# Patient Record
Sex: Female | Born: 1935 | Race: White | Hispanic: No | Smoking: Former smoker
Health system: Southern US, Community
[De-identification: ages and names within clinical notes are randomized; demographics above are authoritative.]

## PROBLEM LIST (undated history)

## (undated) DIAGNOSIS — Z7901 Long term (current) use of anticoagulants: Secondary | ICD-10-CM

## (undated) DIAGNOSIS — I10 Essential (primary) hypertension: Secondary | ICD-10-CM

## (undated) DIAGNOSIS — Z9289 Personal history of other medical treatment: Secondary | ICD-10-CM

## (undated) DIAGNOSIS — K52831 Collagenous colitis: Secondary | ICD-10-CM

## (undated) DIAGNOSIS — K5792 Diverticulitis of intestine, part unspecified, without perforation or abscess without bleeding: Secondary | ICD-10-CM

## (undated) DIAGNOSIS — E119 Type 2 diabetes mellitus without complications: Secondary | ICD-10-CM

## (undated) DIAGNOSIS — M1990 Unspecified osteoarthritis, unspecified site: Secondary | ICD-10-CM

## (undated) DIAGNOSIS — R279 Unspecified lack of coordination: Secondary | ICD-10-CM

## (undated) DIAGNOSIS — K529 Noninfective gastroenteritis and colitis, unspecified: Secondary | ICD-10-CM

## (undated) DIAGNOSIS — R011 Cardiac murmur, unspecified: Secondary | ICD-10-CM

## (undated) DIAGNOSIS — J449 Chronic obstructive pulmonary disease, unspecified: Secondary | ICD-10-CM

## (undated) DIAGNOSIS — R55 Syncope and collapse: Secondary | ICD-10-CM

## (undated) DIAGNOSIS — I480 Paroxysmal atrial fibrillation: Secondary | ICD-10-CM

## (undated) DIAGNOSIS — J432 Centrilobular emphysema: Secondary | ICD-10-CM

## (undated) DIAGNOSIS — E785 Hyperlipidemia, unspecified: Secondary | ICD-10-CM

## (undated) DIAGNOSIS — I5032 Chronic diastolic (congestive) heart failure: Secondary | ICD-10-CM

## (undated) DIAGNOSIS — I700 Atherosclerosis of aorta: Secondary | ICD-10-CM

## (undated) DIAGNOSIS — F0390 Unspecified dementia without behavioral disturbance: Secondary | ICD-10-CM

## (undated) HISTORY — DX: Long term (current) use of anticoagulants: Z79.01

## (undated) HISTORY — DX: Noninfective gastroenteritis and colitis, unspecified: K52.9

## (undated) HISTORY — DX: Hyperlipidemia, unspecified: E78.5

## (undated) HISTORY — DX: Collagenous colitis: K52.831

## (undated) HISTORY — DX: Unspecified lack of coordination: R27.9

## (undated) HISTORY — PX: COLONOSCOPY WITH PROPOFOL: SHX5780

## (undated) HISTORY — PX: ABDOMINAL HYSTERECTOMY: SHX81

## (undated) HISTORY — DX: Paroxysmal atrial fibrillation: I48.0

---

## 1978-03-10 HISTORY — PX: NECK SURGERY: SHX720

## 2003-12-28 ENCOUNTER — Ambulatory Visit: Payer: Self-pay | Admitting: Family Medicine

## 2004-01-09 ENCOUNTER — Ambulatory Visit: Payer: Self-pay | Admitting: Family Medicine

## 2004-02-08 ENCOUNTER — Ambulatory Visit: Payer: Self-pay | Admitting: Family Medicine

## 2004-06-04 ENCOUNTER — Ambulatory Visit: Payer: Self-pay

## 2004-07-23 ENCOUNTER — Ambulatory Visit: Payer: Self-pay

## 2004-08-19 ENCOUNTER — Ambulatory Visit: Payer: Self-pay | Admitting: Family Medicine

## 2004-09-07 ENCOUNTER — Ambulatory Visit: Payer: Self-pay | Admitting: Family Medicine

## 2004-11-13 ENCOUNTER — Ambulatory Visit: Payer: Self-pay

## 2005-10-15 ENCOUNTER — Ambulatory Visit: Payer: Self-pay

## 2005-12-24 ENCOUNTER — Ambulatory Visit: Payer: Self-pay

## 2006-12-17 ENCOUNTER — Ambulatory Visit: Payer: Self-pay

## 2007-04-05 ENCOUNTER — Ambulatory Visit: Payer: Self-pay

## 2007-12-21 ENCOUNTER — Ambulatory Visit: Payer: Self-pay

## 2008-03-30 DIAGNOSIS — C4491 Basal cell carcinoma of skin, unspecified: Secondary | ICD-10-CM

## 2008-03-30 HISTORY — DX: Basal cell carcinoma of skin, unspecified: C44.91

## 2008-04-24 ENCOUNTER — Ambulatory Visit: Payer: Self-pay | Admitting: Gastroenterology

## 2008-04-24 LAB — HM COLONOSCOPY

## 2008-06-26 ENCOUNTER — Ambulatory Visit: Payer: Self-pay | Admitting: Family Medicine

## 2008-07-14 ENCOUNTER — Ambulatory Visit: Payer: Self-pay | Admitting: Specialist

## 2008-08-08 ENCOUNTER — Ambulatory Visit (HOSPITAL_COMMUNITY): Admission: RE | Admit: 2008-08-08 | Discharge: 2008-08-09 | Payer: Self-pay | Admitting: Neurosurgery

## 2008-08-08 HISTORY — PX: BACK SURGERY: SHX140

## 2009-03-19 ENCOUNTER — Ambulatory Visit: Payer: Self-pay

## 2010-01-09 LAB — HM DEXA SCAN: HM Dexa Scan: NORMAL

## 2010-04-22 ENCOUNTER — Ambulatory Visit: Payer: Self-pay

## 2010-06-17 LAB — CBC
Hemoglobin: 14.4 g/dL (ref 12.0–15.0)
MCHC: 34.3 g/dL (ref 30.0–36.0)
MCV: 90.3 fL (ref 78.0–100.0)
RDW: 13.1 % (ref 11.5–15.5)

## 2010-06-17 LAB — GLUCOSE, CAPILLARY
Glucose-Capillary: 110 mg/dL — ABNORMAL HIGH (ref 70–99)
Glucose-Capillary: 146 mg/dL — ABNORMAL HIGH (ref 70–99)
Glucose-Capillary: 203 mg/dL — ABNORMAL HIGH (ref 70–99)

## 2010-06-17 LAB — BASIC METABOLIC PANEL
CO2: 30 mEq/L (ref 19–32)
Calcium: 9.6 mg/dL (ref 8.4–10.5)
Chloride: 105 mEq/L (ref 96–112)
Creatinine, Ser: 0.71 mg/dL (ref 0.4–1.2)
Glucose, Bld: 149 mg/dL — ABNORMAL HIGH (ref 70–99)

## 2010-07-23 NOTE — Op Note (Signed)
[name], [name]              ACCOUNT NO.:  [id number]   MEDICAL RECORD NO.:  [id number]          PATIENT TYPE:  INP   LOCATION:  3535                         FACILITY:  [facility]   PHYSICIAN:  Danae Orleans. Venetia Maxon, M.D.  DATE OF BIRTH:  [birth year]   DATE OF PROCEDURE:  08/08/2008  DATE OF DISCHARGE:                               OPERATIVE REPORT   PREOPERATIVE DIAGNOSIS:  Far lateral right L5-S1 herniated lumbar disk  with spondylosis, degenerative disk disease and radiculopathy.   POSTOPERATIVE DIAGNOSIS:  Far lateral right L5-S1 herniated lumbar disk  with spondylosis, degenerative disk disease and radiculopathy.   PROCEDURE:  Right L5-S1 far lateral microdiskectomy with  microdissection.   SURGEON:  Danae Orleans. Venetia Maxon, MD   ASSISTANT:  Clydene Fake, MD   ANESTHESIA:  General endotracheal anesthesia.   ESTIMATED BLOOD LOSS:  50 mL.   COMPLICATIONS:  None.   DISPOSITION:  To recovery.   INDICATIONS:  [name] is a 77 year old woman with a large acute  onset disk herniation at L5-S1 on the right extraforaminally causing  right L5 nerve root compression and right L5 weakness.  It was elected  to take her to surgery for right far lateral microdiskectomy.   PROCEDURE:  Ms. Joens was brought to the operating room.  Following  satisfactory and uncomplicated induction of general endotracheal  anesthesia plus intravenous lines, the patient was placed in a prone  position on Wilson frame.  Her low back was prepped and draped in the  usual sterile fashion.  With DuraPrep marking x-ray was obtained with a  spinal needle overlying the L5.  Incision was made in the midline and  carried laterally to expose the L5 transverse process and sacral ala on  the right and an intraoperative x-ray confirmed correct orientation.  A  microscope was brought into the field.  Versa-Trac retractor was placed  to facilitate exposure.  The lateral aspect of the arthritic facet joint  was drilled  down and was the pars at this level and the L5 nerve root  was identified after removing the intertransverse ligament.  The nerve  root appeared to be compressed significantly and a large amount of  herniated disk material was found medial and inferomedial to the nerve  root with further removal of bone overlying the sacrum and also more  medially overlying the herniated disk.  Multiple fragments of disk  material were removed with micro pituitary and the nerve root appeared  to take on a more normal course rather then bowed under compression.  A  variety of ball hooks were used to decompress the epidural space and to  confirm that the nerve root was well decompressed.  Hemostasis was  assured with Gelfoam soaked in thrombin and cottonoids.  The wound was  irrigated.  Operative site was bathed in Depo-Medrol and fentanyl.  The  self-retaining retractor was removed.  The lumbodorsal fascia was closed  with 1 Vicryl sutures, subcutaneous tissues were approximated with 2-0  Vicryl  interrupted inverted sutures and skin edges were reapproximated with  interrupted 3-0 Vicryl subcuticular stitch.  The wound was dressed  with  Dermabond.  The patient was extubated in the operating room and taken to  recovery in stable satisfactory condition having tolerated her operation  well.      Danae Orleans. Venetia Maxon, M.D.  Electronically Signed     JDS/MEDQ  D:  08/08/2008  T:  08/09/2008  Job:  [id number]

## 2011-05-30 ENCOUNTER — Ambulatory Visit: Payer: Self-pay

## 2011-10-10 DIAGNOSIS — Z85828 Personal history of other malignant neoplasm of skin: Secondary | ICD-10-CM | POA: Insufficient documentation

## 2011-10-21 ENCOUNTER — Ambulatory Visit: Payer: Self-pay | Admitting: Specialist

## 2011-11-14 ENCOUNTER — Ambulatory Visit: Payer: Self-pay | Admitting: Cardiology

## 2013-02-28 ENCOUNTER — Emergency Department: Payer: Self-pay | Admitting: Emergency Medicine

## 2013-07-15 ENCOUNTER — Ambulatory Visit: Payer: Self-pay | Admitting: Gastroenterology

## 2013-10-19 LAB — HEMOGLOBIN A1C: Hgb A1c MFr Bld: 6.5 % — AB (ref 4.0–6.0)

## 2014-03-16 DIAGNOSIS — H2513 Age-related nuclear cataract, bilateral: Secondary | ICD-10-CM | POA: Diagnosis not present

## 2014-04-19 DIAGNOSIS — E559 Vitamin D deficiency, unspecified: Secondary | ICD-10-CM | POA: Diagnosis not present

## 2014-04-19 DIAGNOSIS — Z1389 Encounter for screening for other disorder: Secondary | ICD-10-CM | POA: Diagnosis not present

## 2014-04-19 DIAGNOSIS — H2513 Age-related nuclear cataract, bilateral: Secondary | ICD-10-CM | POA: Diagnosis not present

## 2014-04-19 DIAGNOSIS — I10 Essential (primary) hypertension: Secondary | ICD-10-CM | POA: Diagnosis not present

## 2014-04-19 DIAGNOSIS — E780 Pure hypercholesterolemia: Secondary | ICD-10-CM | POA: Diagnosis not present

## 2014-04-19 DIAGNOSIS — E119 Type 2 diabetes mellitus without complications: Secondary | ICD-10-CM | POA: Diagnosis not present

## 2014-04-19 LAB — LIPID PANEL
CHOLESTEROL: 180 mg/dL (ref 0–200)
HDL: 62 mg/dL (ref 35–70)
LDL CALC: 90 mg/dL
LDl/HDL Ratio: 1.5
TRIGLYCERIDES: 141 mg/dL (ref 40–160)

## 2014-04-19 LAB — BASIC METABOLIC PANEL
BUN: 27 mg/dL — AB (ref 4–21)
CREATININE: 1 mg/dL (ref 0.5–1.1)
GLUCOSE: 140 mg/dL
Potassium: 4.6 mmol/L (ref 3.4–5.3)
SODIUM: 143 mmol/L (ref 137–147)

## 2014-04-19 LAB — HEPATIC FUNCTION PANEL
ALK PHOS: 38 U/L (ref 25–125)
ALT: 21 U/L (ref 7–35)
AST: 16 U/L (ref 13–35)
Bilirubin, Total: 0.6 mg/dL

## 2014-04-19 LAB — CBC AND DIFFERENTIAL
HEMATOCRIT: 42 % (ref 36–46)
HEMOGLOBIN: 13.9 g/dL (ref 12.0–16.0)
Neutrophils Absolute: 4 /uL
Platelets: 268 10*3/uL (ref 150–399)
WBC: 9.7 10^3/mL

## 2014-04-19 LAB — TSH: TSH: 2.17 u[IU]/mL (ref 0.41–5.90)

## 2014-04-25 DIAGNOSIS — Z1231 Encounter for screening mammogram for malignant neoplasm of breast: Secondary | ICD-10-CM | POA: Diagnosis not present

## 2014-04-25 DIAGNOSIS — Z01419 Encounter for gynecological examination (general) (routine) without abnormal findings: Secondary | ICD-10-CM | POA: Diagnosis not present

## 2014-05-01 ENCOUNTER — Ambulatory Visit: Payer: Self-pay | Admitting: Ophthalmology

## 2014-05-01 DIAGNOSIS — Z7982 Long term (current) use of aspirin: Secondary | ICD-10-CM | POA: Diagnosis not present

## 2014-05-01 DIAGNOSIS — I10 Essential (primary) hypertension: Secondary | ICD-10-CM | POA: Diagnosis not present

## 2014-05-01 DIAGNOSIS — Z90710 Acquired absence of both cervix and uterus: Secondary | ICD-10-CM | POA: Diagnosis not present

## 2014-05-01 DIAGNOSIS — Z79899 Other long term (current) drug therapy: Secondary | ICD-10-CM | POA: Diagnosis not present

## 2014-05-01 DIAGNOSIS — Z9889 Other specified postprocedural states: Secondary | ICD-10-CM | POA: Diagnosis not present

## 2014-05-01 DIAGNOSIS — H2513 Age-related nuclear cataract, bilateral: Secondary | ICD-10-CM | POA: Diagnosis not present

## 2014-05-01 DIAGNOSIS — E118 Type 2 diabetes mellitus with unspecified complications: Secondary | ICD-10-CM | POA: Diagnosis not present

## 2014-05-01 DIAGNOSIS — Z85828 Personal history of other malignant neoplasm of skin: Secondary | ICD-10-CM | POA: Diagnosis not present

## 2014-05-01 DIAGNOSIS — H2511 Age-related nuclear cataract, right eye: Secondary | ICD-10-CM | POA: Diagnosis not present

## 2014-05-01 DIAGNOSIS — E780 Pure hypercholesterolemia: Secondary | ICD-10-CM | POA: Diagnosis not present

## 2014-05-01 DIAGNOSIS — H269 Unspecified cataract: Secondary | ICD-10-CM | POA: Diagnosis not present

## 2014-05-31 DIAGNOSIS — Z961 Presence of intraocular lens: Secondary | ICD-10-CM | POA: Diagnosis not present

## 2014-06-27 DIAGNOSIS — D692 Other nonthrombocytopenic purpura: Secondary | ICD-10-CM | POA: Diagnosis not present

## 2014-06-27 DIAGNOSIS — L570 Actinic keratosis: Secondary | ICD-10-CM | POA: Diagnosis not present

## 2014-06-27 DIAGNOSIS — L821 Other seborrheic keratosis: Secondary | ICD-10-CM | POA: Diagnosis not present

## 2014-06-27 DIAGNOSIS — Z85828 Personal history of other malignant neoplasm of skin: Secondary | ICD-10-CM | POA: Diagnosis not present

## 2014-06-27 DIAGNOSIS — L565 Disseminated superficial actinic porokeratosis (DSAP): Secondary | ICD-10-CM | POA: Diagnosis not present

## 2014-07-09 NOTE — H&P (Signed)
PATIENT NAME:  [name], [name] MR#:  [id number] DATE OF BIRTH:  January 14, 1932  DATE OF ADMISSION:  05/01/2014  PREOPERATIVE DIAGNOSIS: Cataract, right eye.   POSTOPERATIVE DIAGNOSIS: Cataract, right eye.   PROCEDURE PERFORMED: Extracapsular cataract extraction using phacoemulsification with placement of Alcon [id number], 21.0 diopter posterior chamber lens, serial number [id number].   SURGEON: Loura Back. Tashaun Obey, M.D.   ANESTHESIA: 4% lidocaine, 0.75% Marcaine a 50-50 mixture with 10 units/mL of HyoMax added, given as a peribulbar.   ANESTHESIOLOGIST: Dr. Boston Service.   COMPLICATIONS: None.   ESTIMATED BLOOD LOSS: Less than 1 mL.   DESCRIPTION OF PROCEDURE:  The patient was brought to the operating room and given a peribulbar block.  The patient was then prepped and draped in the usual fashion.  The vertical rectus muscles were imbricated using 5-0 silk sutures.  These sutures were then clamped to the sterile drapes as bridle sutures.  A limbal peritomy was performed extending two clock hours and hemostasis was obtained with cautery.  A partial thickness scleral groove was made at the surgical limbus and dissected anteriorly in a lamellar dissection using an Alcon crescent knife.  The anterior chamber was entered superonasally with a Superblade and through the lamellar dissection with a 2.6 mm keratome.  DisCoVisc was used to replace the aqueous and a continuous tear capsulorrhexis was carried out.  Hydrodissection and hydrodelineation were carried out with balanced salt and a 27 gauge canula.  The nucleus was rotated to confirm the effectiveness of the hydrodissection.  Phacoemulsification was carried out using a divide-and-conquer technique.  Total ultrasound time was 1 minute and 52 seconds with an average power of 26.9 percent. CDE 51.11. No suture was placed.   Irrigation/aspiration was used to remove the residual cortex.  DisCoVisc was used to inflate the capsule and the internal  incision was enlarged to 3 mm with the crescent knife.  The intraocular lens was folded and inserted into the capsular bag using the AcrySert delivery system was used instead of a Librarian, academic.  Irrigation/aspiration was used to remove the residual DisCoVisc.  Miostat was injected into the anterior chamber through the paracentesis track to inflate the anterior chamber and induce miosis.  The wound was checked for leaks and none were found. The conjunctiva was closed with cautery and the bridle sutures were removed.  Two drops of 0.3% Vigamox were placed on the eye.   An eye shield was placed on the eye.  The patient was discharged to the recovery room in good condition.   ____________________________ Loura Back Star Cheese, MD sad:mc D: 05/01/2014 13:23:38 ET T: 05/01/2014 13:53:41 ET JOB#: [id number]  cc: Remo Lipps A. Neizan Debruhl, MD, <Dictator>

## 2014-07-09 NOTE — Op Note (Signed)
PATIENT NAME:  [name], [name] MR#:  [id number] DATE OF BIRTH:  [birth year]  DATE OF PROCEDURE:  05/01/2014  PREOPERATIVE DIAGNOSIS: Cataract, right eye.   POSTOPERATIVE DIAGNOSIS: Cataract, right eye.   PROCEDURE PERFORMED: Extracapsular cataract extraction using phacoemulsification with placement of Alcon SN6CWS, 21.0 diopter posterior chamber lens, serial number [id number].   SURGEON: Loura Back. Allure Greaser, M.D.   ANESTHESIA: 4% lidocaine, 0.75% Marcaine a 50-50 mixture with 10 units/mL of HyoMax added, given as a peribulbar.   ANESTHESIOLOGIST: Dr. Boston Service.   COMPLICATIONS: None.   ESTIMATED BLOOD LOSS: Less than 1 mL.   DESCRIPTION OF PROCEDURE:  The patient was brought to the operating room and given a peribulbar block.  The patient was then prepped and draped in the usual fashion.  The vertical rectus muscles were imbricated using 5-0 silk sutures.  These sutures were then clamped to the sterile drapes as bridle sutures.  A limbal peritomy was performed extending two clock hours and hemostasis was obtained with cautery.  A partial thickness scleral groove was made at the surgical limbus and dissected anteriorly in a lamellar dissection using an Alcon crescent knife.  The anterior chamber was entered superonasally with a Superblade and through the lamellar dissection with a 2.6 mm keratome.  DisCoVisc was used to replace the aqueous and a continuous tear capsulorrhexis was carried out.  Hydrodissection and hydrodelineation were carried out with balanced salt and a 27 gauge canula.  The nucleus was rotated to confirm the effectiveness of the hydrodissection.  Phacoemulsification was carried out using a divide-and-conquer technique.  Total ultrasound time was 1 minute and 52 seconds with an average power of 26.9 percent. CDE 51.11. No suture was placed.   Irrigation/aspiration was used to remove the residual cortex.  DisCoVisc was used to inflate the capsule and the internal  incision was enlarged to 3 mm with the crescent knife.  The intraocular lens was folded and inserted into the capsular bag using the AcrySert delivery system was used instead of a Librarian, academic.  Irrigation/aspiration was used to remove the residual DisCoVisc.  Miostat was injected into the anterior chamber through the paracentesis track to inflate the anterior chamber and induce miosis.  The wound was checked for leaks and none were found. The conjunctiva was closed with cautery and the bridle sutures were removed.  Two drops of 0.3% Vigamox were placed on the eye.   An eye shield was placed on the eye.  The patient was discharged to the recovery room in good condition.  ____________________________ Loura Back Haifa Hatton, MD sad:mc D: 05/01/2014 13:23:00 ET T: 05/01/2014 13:53:41 ET JOB#: [id number]  cc: Remo Lipps A. Masaye Gatchalian, MD, <Dictator> Martie Lee MD ELECTRONICALLY SIGNED 05/08/2014 10:52

## 2014-07-12 DIAGNOSIS — I152 Hypertension secondary to endocrine disorders: Secondary | ICD-10-CM | POA: Insufficient documentation

## 2014-07-12 DIAGNOSIS — K52831 Collagenous colitis: Secondary | ICD-10-CM | POA: Insufficient documentation

## 2014-07-12 DIAGNOSIS — E559 Vitamin D deficiency, unspecified: Secondary | ICD-10-CM | POA: Insufficient documentation

## 2014-07-12 DIAGNOSIS — E1159 Type 2 diabetes mellitus with other circulatory complications: Secondary | ICD-10-CM | POA: Insufficient documentation

## 2014-07-12 DIAGNOSIS — E669 Obesity, unspecified: Secondary | ICD-10-CM | POA: Insufficient documentation

## 2014-07-12 DIAGNOSIS — E119 Type 2 diabetes mellitus without complications: Secondary | ICD-10-CM | POA: Insufficient documentation

## 2014-07-12 DIAGNOSIS — L719 Rosacea, unspecified: Secondary | ICD-10-CM | POA: Insufficient documentation

## 2014-07-12 DIAGNOSIS — E7800 Pure hypercholesterolemia, unspecified: Secondary | ICD-10-CM | POA: Insufficient documentation

## 2014-07-12 DIAGNOSIS — I8390 Asymptomatic varicose veins of unspecified lower extremity: Secondary | ICD-10-CM | POA: Insufficient documentation

## 2014-07-12 DIAGNOSIS — I10 Essential (primary) hypertension: Secondary | ICD-10-CM | POA: Insufficient documentation

## 2014-07-12 DIAGNOSIS — M1990 Unspecified osteoarthritis, unspecified site: Secondary | ICD-10-CM | POA: Insufficient documentation

## 2014-07-12 DIAGNOSIS — C4491 Basal cell carcinoma of skin, unspecified: Secondary | ICD-10-CM | POA: Insufficient documentation

## 2014-07-19 ENCOUNTER — Emergency Department
Admission: EM | Admit: 2014-07-19 | Discharge: 2014-07-19 | Disposition: A | Discharge status: Home / Self Care | Payer: Medicare Other | Attending: Emergency Medicine | Admitting: Emergency Medicine

## 2014-07-19 ENCOUNTER — Emergency Department: Payer: Medicare Other

## 2014-07-19 ENCOUNTER — Other Ambulatory Visit: Payer: Self-pay

## 2014-07-19 ENCOUNTER — Encounter: Payer: Self-pay | Admitting: Emergency Medicine

## 2014-07-19 DIAGNOSIS — Z7982 Long term (current) use of aspirin: Secondary | ICD-10-CM | POA: Diagnosis not present

## 2014-07-19 DIAGNOSIS — R05 Cough: Secondary | ICD-10-CM | POA: Diagnosis not present

## 2014-07-19 DIAGNOSIS — J40 Bronchitis, not specified as acute or chronic: Secondary | ICD-10-CM | POA: Insufficient documentation

## 2014-07-19 DIAGNOSIS — Z880 Allergy status to penicillin: Secondary | ICD-10-CM | POA: Diagnosis not present

## 2014-07-19 DIAGNOSIS — R531 Weakness: Secondary | ICD-10-CM | POA: Insufficient documentation

## 2014-07-19 DIAGNOSIS — R61 Generalized hyperhidrosis: Secondary | ICD-10-CM | POA: Insufficient documentation

## 2014-07-19 DIAGNOSIS — Z87891 Personal history of nicotine dependence: Secondary | ICD-10-CM | POA: Diagnosis not present

## 2014-07-19 DIAGNOSIS — R509 Fever, unspecified: Secondary | ICD-10-CM | POA: Diagnosis not present

## 2014-07-19 DIAGNOSIS — R112 Nausea with vomiting, unspecified: Secondary | ICD-10-CM | POA: Diagnosis not present

## 2014-07-19 DIAGNOSIS — E119 Type 2 diabetes mellitus without complications: Secondary | ICD-10-CM | POA: Diagnosis not present

## 2014-07-19 DIAGNOSIS — I10 Essential (primary) hypertension: Secondary | ICD-10-CM | POA: Insufficient documentation

## 2014-07-19 DIAGNOSIS — Z79899 Other long term (current) drug therapy: Secondary | ICD-10-CM | POA: Diagnosis not present

## 2014-07-19 HISTORY — DX: Type 2 diabetes mellitus without complications: E11.9

## 2014-07-19 HISTORY — DX: Essential (primary) hypertension: I10

## 2014-07-19 HISTORY — DX: Diverticulitis of intestine, part unspecified, without perforation or abscess without bleeding: K57.92

## 2014-07-19 LAB — BASIC METABOLIC PANEL
ANION GAP: 10 (ref 5–15)
BUN: 28 mg/dL — AB (ref 6–20)
CHLORIDE: 99 mmol/L — AB (ref 101–111)
CO2: 31 mmol/L (ref 22–32)
CREATININE: 1.24 mg/dL — AB (ref 0.44–1.00)
Calcium: 9.2 mg/dL (ref 8.9–10.3)
GFR calc Af Amer: 46 mL/min — ABNORMAL LOW (ref >60)
GFR calc non Af Amer: 40 mL/min — ABNORMAL LOW (ref >60)
GLUCOSE: 188 mg/dL — AB (ref 65–99)
Potassium: 3.8 mmol/L (ref 3.5–5.1)
Sodium: 140 mmol/L (ref 135–145)

## 2014-07-19 LAB — CBC
HEMATOCRIT: 42.8 % (ref 35.0–47.0)
HEMOGLOBIN: 14.1 g/dL (ref 12.0–16.0)
MCH: 30.4 pg (ref 26.0–34.0)
MCHC: 32.9 g/dL (ref 32.0–36.0)
MCV: 92.2 fL (ref 80.0–100.0)
Platelets: 191 10*3/uL (ref 150–440)
RBC: 4.64 MIL/uL (ref 3.80–5.20)
RDW: 13.2 % (ref 11.5–14.5)
WBC: 5.8 10*3/uL (ref 3.6–11.0)

## 2014-07-19 LAB — TROPONIN I

## 2014-07-19 MED ORDER — BENZONATATE 200 MG PO CAPS: 200.0000 mg | ORAL_CAPSULE | Freq: Three times a day (TID) | ORAL | Status: DC | PRN

## 2014-07-19 MED ORDER — AZITHROMYCIN 250 MG PO TABS: ORAL_TABLET | ORAL | Status: DC

## 2014-07-19 MED ORDER — ONDANSETRON 4 MG PO TBDP
4.0000 mg | ORAL_TABLET | Freq: Once | ORAL | Status: AC
  Administered 2014-07-19: 4 mg via ORAL

## 2014-07-19 MED ORDER — ONDANSETRON 4 MG PO TBDP
ORAL_TABLET | ORAL | Status: AC
  Filled 2014-07-19: qty 1

## 2014-07-19 NOTE — ED Notes (Addendum)
Patient c/o cough and weakness since last Saturday. Reports some n/v. Also states she broke out in a cold sweat at one point today

## 2014-07-19 NOTE — Discharge Instructions (Signed)

## 2014-07-19 NOTE — ED Provider Notes (Signed)
[facility] Emergency Department Provider Note    Time seen: 1430 p.m.  I have reviewed the triage vital signs and the nursing notes.   HISTORY  Chief Complaint Cough and Weakness    HPI [name] is a 81 y.o. female who presents to ER for cough weakness since Saturday. Reports some nausea vomiting and she had some sweats.She states she has not had a fever, no headache back pain no abdominal pain no vomiting or diarrhea. Some persistent cough, symptoms are mild to moderate and she feels weak. Nothing makes her symptoms better coughing makes it worse.    Past Medical History  Diagnosis Date  . Diabetes mellitus without complication   . Hypertension   . Diverticulitis     Patient Active Problem List   Diagnosis Date Noted  . Arthritis 07/12/2014  . Basal cell carcinoma 07/12/2014  . CC (collagenous colitis) 07/12/2014  . Essential (primary) hypertension 07/12/2014  . Hypercholesteremia 07/12/2014  . Adiposity 07/12/2014  . Acne erythematosa 07/12/2014  . Diabetes mellitus, type 2 07/12/2014  . 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 Avitaminosis D 07/12/2014    Past Surgical History  Procedure Laterality Date  . Abdominal hysterectomy    . Back surgery  08/08/2008    Lumbar discectomy  . Neck surgery  1980    Current Outpatient Rx  Name  Route  Sig  Dispense  Refill  . aspirin 81 MG tablet   Oral   Take by mouth.         Marland Kitchen atenolol (TENORMIN) 25 MG tablet   Oral   Take by mouth.         . Blood Glucose Monitoring Suppl (ACCU-CHEK AVIVA CONNECT) W/DEVICE KIT      ACCU-CHEK AVIVA (In Vitro Strip)  1 (one) Strip Strip daily for 0 days  Quantity: 100;  Refills: 3   Ordered :24-Oct-2013  Lynford Humphrey ;  Started 24-Oct-2013 Active Comments: Dx: 250.00 also include Accu-chek aviva meter         . budesonide (ENTOCORT EC) 3 MG 24 hr capsule   Oral   Take by mouth.         . Calcium-Vitamin D 600-200 MG-UNIT per tablet   Oral  Take by mouth.         . Cholecalciferol (VITAMIN D) 2000 UNITS CAPS   Oral   Take by mouth.         Marland Kitchen glucose blood test strip      BAYER CONTOUR TEST (In Vitro Strip)  1 (one) Strip Strip to check blood sugar daily for 0 days  Quantity: 50;  Refills: 5   Ordered :20-May-2013  Gerald Leitz ;  Started 20-May-2013 Active Comments: & Lancets         . lisinopril-hydrochlorothiazide (PRINZIDE,ZESTORETIC) 10-12.5 MG per tablet   Oral   Take by mouth.         . metFORMIN (GLUCOPHAGE) 500 MG tablet   Oral   Take by mouth.         . Multiple Vitamin tablet   Oral   Take by mouth.         . OMEGA-3 FATTY ACIDS PO   Oral   Take by mouth.         . simvastatin (ZOCOR) 20 MG tablet   Oral   Take by mouth.           Allergies Morphine sulfate and Penicillins  Family History  Problem Relation Age of Onset  .  Hypertension Mother   . Diabetes Mother   . Lung cancer Father     Social History History  Substance Use Topics  . Smoking status: Former Smoker    Quit date: 03/10/1994  . Smokeless tobacco: Not on file  . Alcohol Use: Yes     Comment: occasional    Review of Systems Constitutional: Negative for fever. Eyes: Negative for visual changes. ENT: Negative for sore throat. Cardiovascular: Negative for chest pain. Respiratory: Negative for shortness of breath, positive for cough Gastrointestinal: Negative for abdominal pain, vomiting and diarrhea. Genitourinary: Negative for dysuria. Musculoskeletal: Negative for back pain. Skin: Negative for rash. Neurological: Negative for headaches, positive for weakness  10-point ROS otherwise negative.  ____________________________________________   PHYSICAL EXAM:  VITAL SIGNS: ED Triage Vitals  Enc Vitals Group     BP 07/19/14 1143 118/58 mmHg     Pulse Rate 07/19/14 1143 51     Resp 07/19/14 1143 17     Temp 07/19/14 1143 97.7 F (36.5 C)     Temp Source 07/19/14 1143 Oral     SpO2 07/19/14  1143 97 %     Weight 07/19/14 1143 168 lb (76.204 kg)     Height 07/19/14 1143 _0  (1.6 m)     Head Cir --      Peak Flow --      Pain Score --      Pain Loc --      Pain Edu? --      Excl. in [place]? --     Constitutional: Alert and oriented. Well appearing and in no distress. Eyes: Conjunctivae are normal. PERRL. Normal extraocular movements. ENT   Head: Normocephalic and atraumatic.   Nose: No congestion/rhinnorhea.   Mouth/Throat: Mucous membranes are moist.   Neck: No stridor. Hematological/Lymphatic/Immunilogical: No cervical lymphadenopathy. Cardiovascular: Normal rate, regular rhythm. Normal and symmetric distal pulses are present in all extremities. No murmurs, rubs, or gallops. Respiratory: Normal respiratory effort without tachypnea nor retractions. Breath sounds are clear and equal bilaterally. No wheezes/rales/rhonchi. Gastrointestinal: Soft and nontender. No distention. No abdominal bruits. There is no CVA tenderness. Musculoskeletal: Nontender with normal range of motion in all extremities. No joint effusions.  No lower extremity tenderness nor edema. Neurologic:  Normal speech and language. No gross focal neurologic deficits are appreciated. Speech is normal. No gait instability. Skin:  Skin is warm, dry and intact. No rash noted. Psychiatric: Mood and affect are normal. Speech and behavior are normal. Patient exhibits appropriate insight and judgment.  ____________________________________________    LABS (pertinent positives/negatives)  Labs Reviewed  CBC  TROPONIN I  BASIC METABOLIC PANEL  URINALYSIS COMPLETEWITH MICROSCOPIC ([place])    Labs are unremarkable  ____________________________________________    RADIOLOGY  Normal chest x-ray  ____________________________________________    ED COURSE  Pertinent labs & imaging results that were available during my care of the patient were reviewed by me and considered in my medical decision  making (see chart for details).  We checked basic labs and chest x-ray and reevaluate  FINAL ASSESSMENT AND PLAN  Assessment: Bronchitis   Plan: We'll cover patient with antibiotics and cough suppressant. This is likely viral, the family is reassured with antibiotics. Stable for outpatient follow-up.    Earleen Newport, MD   Earleen Newport, MD 07/19/14 [phone]

## 2014-08-15 ENCOUNTER — Ambulatory Visit (INDEPENDENT_AMBULATORY_CARE_PROVIDER_SITE_OTHER): Payer: Medicare Other | Admitting: Family Medicine

## 2014-08-15 ENCOUNTER — Encounter: Payer: Self-pay | Admitting: Family Medicine

## 2014-08-15 VITALS — BP 122/64 | HR 56 | Temp 97.8°F | Resp 16 | Ht 61.5 in | Wt 178.0 lb

## 2014-08-15 DIAGNOSIS — E119 Type 2 diabetes mellitus without complications: Secondary | ICD-10-CM

## 2014-08-15 DIAGNOSIS — I10 Essential (primary) hypertension: Secondary | ICD-10-CM | POA: Diagnosis not present

## 2014-08-15 DIAGNOSIS — E7800 Pure hypercholesterolemia, unspecified: Secondary | ICD-10-CM

## 2014-08-15 DIAGNOSIS — R609 Edema, unspecified: Secondary | ICD-10-CM | POA: Insufficient documentation

## 2014-08-15 LAB — POCT UA - MICROALBUMIN: MICROALBUMIN (UR) POC: 20 mg/L

## 2014-08-15 LAB — POCT GLYCOSYLATED HEMOGLOBIN (HGB A1C): Hemoglobin A1C: 7.5

## 2014-08-15 NOTE — Progress Notes (Signed)
Subjective:    Patient ID: [name], female    DOB: 06-02-1931, 81 y.o.   MRN: [id number]  Diabetes She presents for her follow-up diabetic visit. She has type 2 diabetes mellitus. Her disease course has been stable. There are no hypoglycemic associated symptoms. Pertinent negatives for hypoglycemia include no headaches or sweats. Pertinent negatives for diabetes include no blurred vision, no chest pain, no fatigue, no foot paresthesias, no foot ulcerations, no polydipsia, no polyphagia, no polyuria, no visual change, no weakness and no weight loss. There are no hypoglycemic complications. Symptoms are stable. Risk factors for coronary artery disease include hypertension. Current diabetic treatment includes oral agent (monotherapy). She is compliant with treatment all of the time. Her weight is stable. There is no change in her home blood glucose trend. An ACE inhibitor/angiotensin II receptor blocker is being taken. Eye exam is current.  Hypertension This is a chronic problem. The problem is unchanged. The problem is controlled. Associated symptoms include shortness of breath (Chronic Issue). Pertinent negatives include no anxiety, blurred vision, chest pain, headaches, malaise/fatigue, neck pain, orthopnea, palpitations, peripheral edema, PND or sweats. Risk factors for coronary artery disease include diabetes mellitus. There are no compliance problems.   Hyperlipidemia This is a chronic problem. The problem is controlled. Recent lipid tests were reviewed and are normal. Exacerbating diseases include diabetes. Associated symptoms include shortness of breath (Chronic Issue). Pertinent negatives include no chest pain, leg pain or myalgias. Current antihyperlipidemic treatment includes statins. There are no compliance problems.  Risk factors for coronary artery disease include diabetes mellitus and hypertension.   Pt complains of Swelling of her hands and feet.  It has been going to for a long  time but seems to be worsening especially since the weather has become warmer.  She would like to try a diuretic only as needed.     Review of Systems  Constitutional: Negative for weight loss, malaise/fatigue, activity change, appetite change, fatigue and unexpected weight change.  Eyes: Negative for blurred vision.  Respiratory: Positive for shortness of breath (Chronic Issue). Negative for cough, chest tightness and wheezing.   Cardiovascular: Positive for leg swelling. Negative for chest pain, palpitations, orthopnea and PND.  Gastrointestinal: Negative for nausea, vomiting, abdominal pain, diarrhea, constipation and blood in stool.  Endocrine: Negative for cold intolerance, heat intolerance, polydipsia, polyphagia and polyuria.  Musculoskeletal: Negative.  Negative for myalgias and neck pain.  Neurological: Negative for weakness and headaches.   Patient Active Problem List   Diagnosis Date Noted  . Edema 08/15/2014  . Arthritis 07/12/2014  . Basal cell carcinoma 07/12/2014  . CC (collagenous colitis) 07/12/2014  . Essential (primary) hypertension 07/12/2014  . Hypercholesteremia 07/12/2014  . Adiposity 07/12/2014  . Acne erythematosa 07/12/2014  . Diabetes mellitus, type 2 07/12/2014  . 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 Avitaminosis D 07/12/2014  . H/O malignant neoplasm of skin 10/10/2011   Family History  Problem Relation Age of Onset  . Hypertension Mother   . Diabetes Mother   . Lung cancer Father    History   Social History  . Marital Status: Widowed    Spouse Name: N/A  . Number of Children: N/A  . Years of Education: N/A   Occupational History  . Not on file.   Social History Main Topics  . Smoking status: Former Smoker    Quit date: 03/10/1994  . Smokeless tobacco: Never Used  . Alcohol Use: Yes     Comment: occasional  . Drug Use: No  .  Sexual Activity: Not on file   Other Topics Concern  . Not on file   Social History Narrative   Past Surgical  History  Procedure Laterality Date  . Abdominal hysterectomy    . Back surgery  08/08/2008    Lumbar discectomy  . Neck surgery  1980   Current Outpatient Prescriptions on File Prior to Visit  Medication Sig Dispense Refill  . aspirin 81 MG tablet Take by mouth.    Marland Kitchen atenolol (TENORMIN) 25 MG tablet Take by mouth.    . Blood Glucose Monitoring Suppl (ACCU-CHEK AVIVA CONNECT) W/DEVICE KIT ACCU-CHEK AVIVA (In Vitro Strip)  1 (one) Strip Strip daily for 0 days  Quantity: 100;  Refills: 3   Ordered :24-Oct-2013  Lynford Humphrey ;  Started 24-Oct-2013 Active Comments: Dx: 250.00 also include Accu-chek aviva meter    . budesonide (ENTOCORT EC) 3 MG 24 hr capsule Take by mouth.    . Calcium-Vitamin D 600-200 MG-UNIT per tablet Take by mouth.    . Cholecalciferol (VITAMIN D) 2000 UNITS CAPS Take by mouth.    Marland Kitchen glucose blood test strip BAYER CONTOUR TEST (In Vitro Strip)  1 (one) Strip Strip to check blood sugar daily for 0 days  Quantity: 50;  Refills: 5   Ordered :20-May-2013  Gerald Leitz ;  Started 20-May-2013 Active Comments: & Lancets    . lisinopril-hydrochlorothiazide (PRINZIDE,ZESTORETIC) 10-12.5 MG per tablet Take by mouth.    . metFORMIN (GLUCOPHAGE) 500 MG tablet Take by mouth.    . Multiple Vitamin tablet Take by mouth.    . OMEGA-3 FATTY ACIDS PO Take by mouth.    . simvastatin (ZOCOR) 20 MG tablet Take by mouth.     No current facility-administered medications on file prior to visit.   Allergies  Allergen Reactions  . Morphine Sulfate   . Penicillins Diarrhea   BP 122/64 mmHg  Pulse 56  Temp(Src) 97.8 F (36.6 C) (Oral)  Resp 16  Ht 5' 1.5" (1.562 m)  Wt 178 lb (80.74 kg)  BMI 33.09 kg/m2      Objective:   Physical Exam  Constitutional: She is oriented to person, place, and time. She appears well-developed and well-nourished.  Cardiovascular: Normal rate, regular rhythm and normal heart sounds.   Pulmonary/Chest: Effort normal and breath sounds normal.   Musculoskeletal:       Right lower leg: She exhibits swelling and edema (Trace Edema).       Left lower leg: She exhibits swelling and edema (Trace Edema).  Neurological: She is alert and oriented to person, place, and time.  Psychiatric: She has a normal mood and affect. Judgment normal.          Assessment & Plan:  1. Essential (primary) hypertension Stable continue current medications.  2. Type 2 diabetes mellitus without complication S2G improved at 7.5%, continue current meds and lifestyle.  Will recheck in four months.   - POCT HgB A1C - POCT UA - Microalbumin  3. Hypercholesteremia Stable.    4. Edema    Worsening, but pt has decided against prn meds for the time being.    Patient was seen and examined by Jerrell Belfast, MD, and note scribed by Ashley Royalty, CMA.   I have reviewed the document for accuracy and completeness and I agree with above. Jerrell Belfast, MD

## 2014-09-21 ENCOUNTER — Other Ambulatory Visit: Payer: Self-pay | Admitting: Gastroenterology

## 2015-01-01 ENCOUNTER — Encounter: Payer: Self-pay | Admitting: Family Medicine

## 2015-01-01 ENCOUNTER — Ambulatory Visit (INDEPENDENT_AMBULATORY_CARE_PROVIDER_SITE_OTHER): Payer: Medicare Other | Admitting: Family Medicine

## 2015-01-01 VITALS — BP 122/60 | HR 50 | Temp 97.4°F | Resp 16 | Wt 185.0 lb

## 2015-01-01 DIAGNOSIS — E7800 Pure hypercholesterolemia, unspecified: Secondary | ICD-10-CM

## 2015-01-01 DIAGNOSIS — R001 Bradycardia, unspecified: Secondary | ICD-10-CM | POA: Diagnosis not present

## 2015-01-01 DIAGNOSIS — I10 Essential (primary) hypertension: Secondary | ICD-10-CM | POA: Diagnosis not present

## 2015-01-01 DIAGNOSIS — Z23 Encounter for immunization: Secondary | ICD-10-CM

## 2015-01-01 DIAGNOSIS — E119 Type 2 diabetes mellitus without complications: Secondary | ICD-10-CM | POA: Diagnosis not present

## 2015-01-01 DIAGNOSIS — D692 Other nonthrombocytopenic purpura: Secondary | ICD-10-CM | POA: Insufficient documentation

## 2015-01-01 LAB — POCT GLYCOSYLATED HEMOGLOBIN (HGB A1C): HEMOGLOBIN A1C: 8.6

## 2015-01-01 NOTE — Progress Notes (Signed)
Patient ID: [name], female   DOB: 08-13-1931, 81 y.o.   MRN: [id number]       [name]    DOB: Jul 14, 1931   81 y.o.   MRN: [id number] Visit Date: 01/01/2015  Today's Provider: Margarita Rana, MD   Chief Complaint  Patient presents with  . Hypertension  . Diabetes  . Hyperlipidemia   Subjective:    HPI  Diabetes Mellitus Type II, Follow-up:   Lab Results  Component Value Date   HGBA1C 7.5 08/15/2014   HGBA1C 6.5* 10/19/2013   Last seen for diabetes 4 months ago.  Management since then includes none. She reports good compliance with treatment. She is not having side effects.  Current symptoms include none. Home blood sugar records: Pt reports that when she eat good, her blood sugars are good but when she does not they are high.  Was just on vacation in [place].  Did not eat as well as could. Also does not like to cook, so picks. Varies depending on her mood. Does try to eat sugar free, still has carbs. Episodes of hypoglycemia? no   Current Insulin Regimen: n/a Most Recent Eye Exam: Within the year Current exercise: gardening and walking     Hypertension, follow-up:  BP Readings from Last 3 Encounters:  01/01/15 122/60  08/15/14 122/64  07/19/14 148/57    She was last seen for hypertension 4 months ago.  BP at that visit was 122/64. Management since that visit includes none.She reports good compliance with treatment. She is not having side effects.  She is exercising. She is adherent to low salt diet.   Outside blood pressures are not being checked. Patient denies chest pain, exertional chest pressure/discomfort, fatigue and near-syncope.   Cardiovascular risk factors include advanced age (older than 61 for men, 38 for women), diabetes mellitus, dyslipidemia and hypertension.       Lipid/Cholesterol, Follow-up:   Last seen for this 4 months ago.  Management since that visit includes none.  Last Lipid Panel:      Component Value Date/Time   CHOL 180 04/19/2014   TRIG 141 04/19/2014   HDL 62 04/19/2014   LDLCALC 90 04/19/2014       Allergies  Allergen Reactions  . Morphine Sulfate   . Penicillins Diarrhea   Previous Medications   ASPIRIN 81 MG TABLET    Take by mouth.   ATENOLOL (TENORMIN) 25 MG TABLET    Take by mouth.   BLOOD GLUCOSE MONITORING SUPPL (ACCU-CHEK AVIVA CONNECT) W/DEVICE KIT    ACCU-CHEK AVIVA (In Vitro Strip)  1 (one) Strip Strip daily for 0 days  Quantity: 100;  Refills: 3   Ordered :24-Oct-2013  Lynford Humphrey ;  Started 24-Oct-2013 Active Comments: Dx: 250.00 also include Accu-chek aviva meter   BUDESONIDE (ENTOCORT EC) 3 MG 24 HR CAPSULE    TAKE 3  CAPSULES  ORAL, DAILY   CALCIUM-VITAMIN D 600-200 MG-UNIT PER TABLET    Take by mouth.   CHOLECALCIFEROL (VITAMIN D) 2000 UNITS CAPS    Take by mouth.   GLUCOSE BLOOD TEST STRIP    BAYER CONTOUR TEST (In Vitro Strip)  1 (one) Strip Strip to check blood sugar daily for 0 days  Quantity: 50;  Refills: 5   Ordered :20-May-2013  Gerald Leitz ;  Started 20-May-2013 Active Comments: & Lancets   LISINOPRIL-HYDROCHLOROTHIAZIDE (PRINZIDE,ZESTORETIC) 10-12.5 MG PER TABLET    Take by mouth.   METFORMIN (GLUCOPHAGE) 500 MG TABLET  Take by mouth.   MULTIPLE VITAMIN TABLET    Take by mouth.   OMEGA-3 FATTY ACIDS PO    Take by mouth.   SIMVASTATIN (ZOCOR) 20 MG TABLET    Take by mouth.    Review of Systems  Constitutional: Negative.   HENT: Negative.   Eyes: Negative.   Respiratory: Positive for shortness of breath (has had this for years per pt).   Cardiovascular: Negative.   Gastrointestinal: Negative.   Endocrine: Negative.   Genitourinary: Negative.   Musculoskeletal: Negative.   Allergic/Immunologic: Negative.   Neurological: Negative.   Hematological: Negative.   Psychiatric/Behavioral: Negative.     Social History  Substance Use Topics  . Smoking status: Former Smoker    Quit date: 03/10/1994  . Smokeless  tobacco: Never Used  . Alcohol Use: Yes     Comment: occasional   Objective:   BP 122/60 mmHg  Pulse 50  Temp(Src) 97.4 F (36.3 C) (Oral)  Resp 16  Wt 185 lb (83.915 kg)  Physical Exam  Constitutional: She is oriented to person, place, and time. She appears well-developed and well-nourished.  Cardiovascular: Normal rate and regular rhythm.   Pulmonary/Chest: Effort normal and breath sounds normal.  Neurological: She is alert and oriented to person, place, and time.  Psychiatric: She has a normal mood and affect. Her behavior is normal. Judgment and thought content normal.      Assessment & Plan:     1. Essential (primary) hypertension Low BP and sinus bradycardia. Will stop Atenolol and follow up in 2 weeks.  Continue other medication as previous. - EKG 12-Lead  2. Type 2 diabetes mellitus without complication, unspecified long term insulin use status ([place]) Not at goal.  Stressed the importance of eating healthy and exercise. Also be careful about lows.   - POCT HgB A1C Results for orders placed or performed in visit on 01/01/15  POCT HgB A1C  Result Value Ref Range   Hemoglobin A1C 8.6     3. Hypercholesteremia Stable.   4. Need for influenza vaccination Given today.  - Flu vaccine HIGH DOSE PF  5. Senile purpura ([place]) Noted on arms today.   6. Bradycardia Concerned that could have contributed to syncopal episode.   Will stop Atenolol and recheck in 2 weeks. Patient did not eat or drink, except coffee from 5 am until after 1. Call if symptoms recur.      Margarita Rana, MD  [facility]

## 2015-01-15 ENCOUNTER — Ambulatory Visit (INDEPENDENT_AMBULATORY_CARE_PROVIDER_SITE_OTHER): Payer: Medicare Other | Admitting: Family Medicine

## 2015-01-15 ENCOUNTER — Encounter: Payer: Self-pay | Admitting: Family Medicine

## 2015-01-15 VITALS — BP 120/64 | HR 72 | Temp 97.7°F | Resp 16 | Wt 183.0 lb

## 2015-01-15 DIAGNOSIS — I10 Essential (primary) hypertension: Secondary | ICD-10-CM | POA: Diagnosis not present

## 2015-01-15 NOTE — Progress Notes (Signed)
Patient ID: [name], female   DOB: [birth year], 81 y.o.   MRN: [id number]       [name]    DOB: 1931/10/11   81 y.o.   MRN: [id number] Visit Date: 01/15/2015  Today's Provider: Margarita Rana, MD   Chief Complaint  Patient presents with  . Hypertension    2 week F/U    Subjective:    Hypertension This is a chronic problem. The problem is unchanged. The problem is controlled. Pertinent negatives include no anxiety, blurred vision, chest pain, headaches, malaise/fatigue, neck pain, orthopnea, palpitations, peripheral edema, PND, shortness of breath or sweats. The current treatment provides moderate improvement. There are no compliance problems.   Patient was seen in the office 2 weeks ago, and was advised to discontinue Atenolol 44m daily. She reports that she checks her blood pressure occasionally, and it has not been high. However, while attending a doctor's appt with her daughter last week, she asked the nurse to take her BP and her HR and her HR was in the 170s. Was upset at the time. Was with her daughter at cancer appointment.  Patient denies any palpitations or chest pain. She says when she checks it at home it is between 80-90s bpm.  Does think she is doing fine.      Allergies  Allergen Reactions  . Morphine Sulfate   . Penicillins Diarrhea   Previous Medications   ASPIRIN 81 MG TABLET    Take by mouth.   BLOOD GLUCOSE MONITORING SUPPL (ACCU-CHEK AVIVA CONNECT) W/DEVICE KIT    ACCU-CHEK AVIVA (In Vitro Strip)  1 (one) Strip Strip daily for 0 days  Quantity: 100;  Refills: 3   Ordered :24-Oct-2013  DLynford Humphrey;  Started 24-Oct-2013 Active Comments: Dx: 250.00 also include Accu-chek aviva meter   BUDESONIDE (ENTOCORT EC) 3 MG 24 HR CAPSULE    TAKE 3  CAPSULES  ORAL, DAILY   CALCIUM-VITAMIN D 600-200 MG-UNIT PER TABLET    Take by mouth.   CHOLECALCIFEROL (VITAMIN D) 2000 UNITS CAPS    Take by mouth.   GLUCOSE BLOOD TEST STRIP    BAYER CONTOUR  TEST (In Vitro Strip)  1 (one) Strip Strip to check blood sugar daily for 0 days  Quantity: 50;  Refills: 5   Ordered :20-May-2013  HGerald Leitz;  Started 20-May-2013 Active Comments: & Lancets   LISINOPRIL-HYDROCHLOROTHIAZIDE (PRINZIDE,ZESTORETIC) 10-12.5 MG PER TABLET    Take by mouth.   METFORMIN (GLUCOPHAGE) 500 MG TABLET    Take by mouth.   MULTIPLE VITAMIN TABLET    Take by mouth.   OMEGA-3 FATTY ACIDS PO    Take by mouth.   SIMVASTATIN (ZOCOR) 20 MG TABLET    Take by mouth.    Review of Systems  Constitutional: Negative.  Negative for malaise/fatigue.  Eyes: Negative for blurred vision.  Respiratory: Negative.  Negative for shortness of breath.   Cardiovascular: Negative.  Negative for chest pain, palpitations, orthopnea and PND.  Musculoskeletal: Negative.  Negative for neck pain.  Neurological: Negative.  Negative for headaches.    Social History  Substance Use Topics  . Smoking status: Former Smoker    Quit date: 03/10/1994  . Smokeless tobacco: Never Used  . Alcohol Use: Yes     Comment: occasional   Objective:   BP 120/64 mmHg  Pulse 72  Temp(Src) 97.7 F (36.5 C)  Resp 16  Wt 183 lb (83.008 kg)  SpO2 95%  Physical Exam  Constitutional: She is oriented to person, place, and time. She appears well-developed and well-nourished.  Cardiovascular: Normal rate and regular rhythm.   Pulmonary/Chest: Effort normal and breath sounds normal.  Neurological: She is alert and oriented to person, place, and time.  Psychiatric: She has a normal mood and affect. Her behavior is normal. Judgment and thought content normal.        Assessment & Plan:      1. Essential (primary) hypertension Improved, pulse improved. Continue off Atenolol. Call if any palpitations.  Recheck blood sugar in 3 months.      Margarita Rana, MD  [facility]

## 2015-02-06 DIAGNOSIS — Z961 Presence of intraocular lens: Secondary | ICD-10-CM | POA: Diagnosis not present

## 2015-02-08 DIAGNOSIS — L570 Actinic keratosis: Secondary | ICD-10-CM | POA: Diagnosis not present

## 2015-02-08 DIAGNOSIS — L821 Other seborrheic keratosis: Secondary | ICD-10-CM | POA: Diagnosis not present

## 2015-02-17 ENCOUNTER — Other Ambulatory Visit: Payer: Self-pay | Admitting: Family Medicine

## 2015-02-17 DIAGNOSIS — E119 Type 2 diabetes mellitus without complications: Secondary | ICD-10-CM

## 2015-02-19 ENCOUNTER — Other Ambulatory Visit: Payer: Self-pay | Admitting: Gastroenterology

## 2015-02-24 ENCOUNTER — Other Ambulatory Visit: Payer: Self-pay | Admitting: Family Medicine

## 2015-02-24 DIAGNOSIS — I10 Essential (primary) hypertension: Secondary | ICD-10-CM

## 2015-02-28 ENCOUNTER — Encounter: Payer: Self-pay | Admitting: Emergency Medicine

## 2015-02-28 ENCOUNTER — Emergency Department
Admission: EM | Admit: 2015-02-28 | Discharge: 2015-02-28 | Disposition: A | Discharge status: Home / Self Care | Payer: Medicare Other | Attending: Emergency Medicine | Admitting: Emergency Medicine

## 2015-02-28 DIAGNOSIS — Y9389 Activity, other specified: Secondary | ICD-10-CM | POA: Diagnosis not present

## 2015-02-28 DIAGNOSIS — W2209XA Striking against other stationary object, initial encounter: Secondary | ICD-10-CM | POA: Diagnosis not present

## 2015-02-28 DIAGNOSIS — Z79899 Other long term (current) drug therapy: Secondary | ICD-10-CM | POA: Insufficient documentation

## 2015-02-28 DIAGNOSIS — Z87891 Personal history of nicotine dependence: Secondary | ICD-10-CM | POA: Diagnosis not present

## 2015-02-28 DIAGNOSIS — E119 Type 2 diabetes mellitus without complications: Secondary | ICD-10-CM | POA: Insufficient documentation

## 2015-02-28 DIAGNOSIS — Z7984 Long term (current) use of oral hypoglycemic drugs: Secondary | ICD-10-CM | POA: Insufficient documentation

## 2015-02-28 DIAGNOSIS — IMO0002 Reserved for concepts with insufficient information to code with codable children: Secondary | ICD-10-CM

## 2015-02-28 DIAGNOSIS — Y998 Other external cause status: Secondary | ICD-10-CM | POA: Diagnosis not present

## 2015-02-28 DIAGNOSIS — I10 Essential (primary) hypertension: Secondary | ICD-10-CM | POA: Diagnosis not present

## 2015-02-28 DIAGNOSIS — S81812A Laceration without foreign body, left lower leg, initial encounter: Secondary | ICD-10-CM | POA: Diagnosis not present

## 2015-02-28 DIAGNOSIS — Z23 Encounter for immunization: Secondary | ICD-10-CM | POA: Insufficient documentation

## 2015-02-28 DIAGNOSIS — Y9289 Other specified places as the place of occurrence of the external cause: Secondary | ICD-10-CM | POA: Insufficient documentation

## 2015-02-28 DIAGNOSIS — Z880 Allergy status to penicillin: Secondary | ICD-10-CM | POA: Insufficient documentation

## 2015-02-28 DIAGNOSIS — Z7982 Long term (current) use of aspirin: Secondary | ICD-10-CM | POA: Insufficient documentation

## 2015-02-28 MED ORDER — LIDOCAINE-EPINEPHRINE-TETRACAINE (LET) SOLUTION
3.0000 mL | Freq: Once | NASAL | Status: AC
  Administered 2015-02-28: 3 mL via TOPICAL
  Filled 2015-02-28: qty 3

## 2015-02-28 MED ORDER — TETANUS-DIPHTH-ACELL PERTUSSIS 5-2.5-18.5 LF-MCG/0.5 IM SUSP
0.5000 mL | Freq: Once | INTRAMUSCULAR | Status: AC
  Administered 2015-02-28: 0.5 mL via INTRAMUSCULAR
  Filled 2015-02-28: qty 0.5

## 2015-02-28 NOTE — ED Notes (Signed)
Pt hit left leg on dishwasher.  Skin tear noted to left leg.

## 2015-02-28 NOTE — ED Provider Notes (Signed)
[facility] Emergency Department Provider Note ____________________________________________  Time seen: Approximately 3:22 PM  I have reviewed the triage vital signs and the nursing notes.   HISTORY  Chief Complaint Laceration  HPI [name] is a 81 y.o. female who presents to the emergency department for evaluation of her left lower leg. She ran into the door of the open dishwasher and now has a laceration. Tetanus shot is not up to date.    Past Medical History  Diagnosis Date  . Diabetes mellitus without complication ([place])   . Hypertension   . Diverticulitis     Patient Active Problem List   Diagnosis Date Noted  . Senile purpura ([place]) 01/01/2015  . Edema 08/15/2014  . Arthritis 07/12/2014  . Basal cell carcinoma 07/12/2014  . CC (collagenous colitis) 07/12/2014  . Essential (primary) hypertension 07/12/2014  . Hypercholesteremia 07/12/2014  . Adiposity 07/12/2014  . Acne erythematosa 07/12/2014  . Diabetes mellitus, type 2 ([place]) 07/12/2014  . 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 Avitaminosis D 07/12/2014  . H/O malignant neoplasm of skin 10/10/2011    Past Surgical History  Procedure Laterality Date  . Abdominal hysterectomy    . Back surgery  08/08/2008    Lumbar discectomy  . Neck surgery  1980    Current Outpatient Rx  Name  Route  Sig  Dispense  Refill  . aspirin 81 MG tablet   Oral   Take by mouth.         . Blood Glucose Monitoring Suppl (ACCU-CHEK AVIVA CONNECT) W/DEVICE KIT      ACCU-CHEK AVIVA (In Vitro Strip)  1 (one) Strip Strip daily for 0 days  Quantity: 100;  Refills: 3   Ordered :24-Oct-2013  Lynford Humphrey ;  Started 24-Oct-2013 Active Comments: Dx: 250.00 also include Accu-chek aviva meter         . budesonide (ENTOCORT EC) 3 MG 24 hr capsule      TAKE 3  CAPSULES  ORAL, DAILY   90 capsule   3     CYCLE FILL MEDICATION. Authorization is required f ...   . Calcium-Vitamin D 600-200 MG-UNIT per tablet    Oral   Take by mouth.         . Cholecalciferol (VITAMIN D) 2000 UNITS CAPS   Oral   Take by mouth.         Marland Kitchen glucose blood test strip      BAYER CONTOUR TEST (In Vitro Strip)  1 (one) Strip Strip to check blood sugar daily for 0 days  Quantity: 50;  Refills: 5   Ordered :20-May-2013  Gerald Leitz ;  Started 20-May-2013 Active Comments: & Lancets         . lisinopril-hydrochlorothiazide (PRINZIDE,ZESTORETIC) 10-12.5 MG tablet      1 (ONE) TABLET, ORAL, DAILY   90 tablet   2     CYCLE FILL MEDICATION. Authorization is required f ...   . metFORMIN (GLUCOPHAGE) 500 MG tablet      TAKE 1 TABLET, ORAL, TWO TIMES DAILY   180 tablet   1     CYCLE FILL MEDICATION. Authorization is required f ...   . Multiple Vitamin tablet   Oral   Take by mouth.         . OMEGA-3 FATTY ACIDS PO   Oral   Take by mouth.         . simvastatin (ZOCOR) 20 MG tablet   Oral   Take by mouth.  Allergies Morphine sulfate and Penicillins  Family History  Problem Relation Age of Onset  . Hypertension Mother   . Diabetes Mother   . Lung cancer Father     Social History Social History  Substance Use Topics  . Smoking status: Former Smoker    Quit date: 03/10/1994  . Smokeless tobacco: Never Used  . Alcohol Use: Yes     Comment: occasional    Review of Systems   Constitutional: No fever/chills Eyes: No visual changes. ENT: No congestion or rhinorrhea Cardiovascular: Denies chest pain. Respiratory: Denies shortness of breath. Gastrointestinal: No abdominal pain.  Genitourinary: Negative for dysuria. Musculoskeletal: Negative for back pain. Skin: Laceration to the left lower leg. Neurological: Negative for headaches, focal weakness or numbness.  10-point ROS otherwise negative.  ____________________________________________   PHYSICAL EXAM:  VITAL SIGNS: ED Triage Vitals  Enc Vitals Group     BP 02/28/15 1451 149/66 mmHg     Pulse Rate 02/28/15 1451  83     Resp 02/28/15 1451 18     Temp 02/28/15 1451 97.7 F (36.5 C)     Temp Source 02/28/15 1451 Oral     SpO2 02/28/15 1451 94 %     Weight 02/28/15 1451 170 lb (77.111 kg)     Height 02/28/15 1451 5' 1.5" (1.562 m)     Head Cir --      Peak Flow --      Pain Score 02/28/15 1452 3     Pain Loc --      Pain Edu? --      Excl. in [place]? --     Constitutional: Alert and oriented. Well appearing and in no acute distress. Eyes: Conjunctivae are normal. PERRL. EOMI. Head: Atraumatic. Nose: No congestion/rhinnorhea. Mouth/Throat: Mucous membranes are moist. Neck: No stridor. Cardiovascular: Normal rate, regular rhythm.  Good peripheral circulation. Respiratory: Normal respiratory effort.  Gastrointestinal: Soft and nontender. No distention. Musculoskeletal: Baseline range of motion of all extremities. Neurologic:  Normal speech and language. No gross focal neurologic deficits are appreciated. Speech is normal. No gait instability. Skin:  Skin tear/laceration to the pretibial area of the left lower extremity measuring 6.5cm; Negative for petechiae.  Psychiatric: Mood and affect are normal. Speech and behavior are normal.  ____________________________________________   LABS (all labs ordered are listed, but only abnormal results are displayed)  Labs Reviewed - No data to display ____________________________________________  EKG   ____________________________________________  RADIOLOGY  Not indicated. ____________________________________________   PROCEDURES  Procedure(s) performed:  LACERATION REPAIR Performed by: Sherrie George Authorized by: Sherrie George Consent: Verbal consent obtained. Risks and benefits: risks, benefits and alternatives were discussed Consent given by: patient Patient identity confirmed: provided demographic data Prepped and Draped in normal sterile fashion Wound explored  Laceration Location: left pretibial area  Laceration Length: 6.5  cm  No Foreign Bodies seen or palpated  Anesthesia: topical  Local anesthetic: LET  Anesthetic total: 3 ml  Irrigation method: syringe Amount of cleaning: standard  Skin closure: 3-0 nylon and steri strips  Number of sutures: 2  Technique: simple interrupted  Patient tolerance: Patient tolerated the procedure well with no immediate complications.  ____________________________________________   INITIAL IMPRESSION / ASSESSMENT AND PLAN / ED COURSE  Pertinent labs & imaging results that were available during my care of the patient were reviewed by me and considered in my medical decision making (see chart for details).  Patient to follow up with her PCP in 10 days for suture removal. Wound care was  discussed. She was advised to return to the ER for symptoms of concern if unable to schedule an appointment. ____________________________________________   FINAL CLINICAL IMPRESSION(S) / ED DIAGNOSES  Final diagnoses:  Laceration       Victorino Dike, FNP 02/28/15 [place], MD 03/03/15 1743

## 2015-03-06 ENCOUNTER — Telehealth: Payer: Self-pay

## 2015-03-06 NOTE — Telephone Encounter (Signed)
Patient states that she hit her left leg on dishwasher and she states she has done this many times before. This time she had to go to the hospital and they put a lot of stitches in because of her skin been so thin. A lot of bruising, stitches present below knee and above the ankle. She was told to follow up in 10 days to remove these. She wanted to come in next Tuesday and her stitches were put in on December 21st. Please let pt know what time she can come in. Thank you-aa

## 2015-03-06 NOTE — Telephone Encounter (Signed)
Pt advised and appt made-aa 

## 2015-03-06 NOTE — Telephone Encounter (Signed)
Ok to put in same day. Thanks.   

## 2015-03-13 ENCOUNTER — Encounter: Payer: Self-pay | Admitting: Family Medicine

## 2015-03-13 ENCOUNTER — Ambulatory Visit (INDEPENDENT_AMBULATORY_CARE_PROVIDER_SITE_OTHER): Payer: Medicare Other | Admitting: Family Medicine

## 2015-03-13 VITALS — BP 138/62 | HR 72 | Temp 97.7°F | Resp 20

## 2015-03-13 DIAGNOSIS — T148 Other injury of unspecified body region: Secondary | ICD-10-CM

## 2015-03-13 DIAGNOSIS — IMO0002 Reserved for concepts with insufficient information to code with codable children: Secondary | ICD-10-CM | POA: Insufficient documentation

## 2015-03-13 DIAGNOSIS — L03116 Cellulitis of left lower limb: Secondary | ICD-10-CM

## 2015-03-13 MED ORDER — DOXYCYCLINE HYCLATE 100 MG PO TABS: 100.0000 mg | ORAL_TABLET | Freq: Two times a day (BID) | ORAL | Status: DC

## 2015-03-13 NOTE — Progress Notes (Signed)
Subjective:     Patient ID: [name], female   DOB: [birth year], 82 y.o.   MRN: [id number]  Chief Complaint  Patient presents with  . Suture / Staple Removal  . Cellulitis    redness, warmth, red pus-filled drainage coming from wound. Noticed sx 4 days ago. Pt was NOT prescribed ABX while at ED.   Suture / Staple Removal  Has 5cm curved laceration on L lower leg from hitting dishwasher on 12/21. Went to the ED and had 2 stitches, steri- strips, tetanus shot, but no antibiotic. .   Cellulitis Four days ago (Friday), the  wound became painful, swollen, red, , warm to touch, and has been worsening since then. Pain was 10/10 this morning, currently 4/10 after taking pain meds. Couple days ago noticed pus and bloody drainage running down her leg.  No fevers. Bandage changed twice 12/21 - 12/26, since then not bandaged. Pt has been wearing shorter pants and making sure nothing touches her wound. Washes it by letting water run over it in the shower. She has not been using ointment or abx. Takes Tylenol, Aleve, or Advil which helps the pain. Pt is taking aspirin. Never had skin infection in the past even with prior surgeries. Pt has been taking diabetes medications as prescribed. Checks blood sugars twice a week, 120s-140s.  Stable.   Did have great holidays otherwise.     Review of Systems  Constitutional: Negative for fever, chills, fatigue and unexpected weight change.  HENT: Negative.   Eyes: Negative.   Respiratory: Negative.   Cardiovascular: Negative.   Gastrointestinal:       Baseline diarrhea from colitis  Genitourinary: Negative.   Musculoskeletal: Negative.   Skin:       See HPI    Patient Active Problem List   Diagnosis Date Noted  . Senile purpura ([place]) 01/01/2015  . Edema 08/15/2014  . Arthritis 07/12/2014  . Basal cell carcinoma 07/12/2014  . CC (collagenous colitis) 07/12/2014  . Essential (primary) hypertension 07/12/2014  . Hypercholesteremia 07/12/2014  .  Adiposity 07/12/2014  . Acne erythematosa 07/12/2014  . Diabetes mellitus, type 2 ([place]) 07/12/2014  . 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 Avitaminosis D 07/12/2014  . H/O malignant neoplasm of skin 10/10/2011   Previous Medications   ASPIRIN 81 MG TABLET    Take by mouth.   BLOOD GLUCOSE MONITORING SUPPL (ACCU-CHEK AVIVA CONNECT) W/DEVICE KIT    ACCU-CHEK AVIVA (In Vitro Strip)  1 (one) Strip Strip daily for 0 days  Quantity: 100;  Refills: 3   Ordered :24-Oct-2013  Lynford Humphrey ;  Started 24-Oct-2013 Active Comments: Dx: 250.00 also include Accu-chek aviva meter   BUDESONIDE (ENTOCORT EC) 3 MG 24 HR CAPSULE    TAKE 3  CAPSULES  ORAL, DAILY   CALCIUM-VITAMIN D 600-200 MG-UNIT PER TABLET    Take by mouth.   CHOLECALCIFEROL (VITAMIN D) 2000 UNITS CAPS    Take by mouth.   GLUCOSE BLOOD TEST STRIP    BAYER CONTOUR TEST (In Vitro Strip)  1 (one) Strip Strip to check blood sugar daily for 0 days  Quantity: 50;  Refills: 5   Ordered :20-May-2013  Gerald Leitz ;  Started 20-May-2013 Active Comments: & Lancets   LISINOPRIL-HYDROCHLOROTHIAZIDE (PRINZIDE,ZESTORETIC) 10-12.5 MG TABLET    1 (ONE) TABLET, ORAL, DAILY   METFORMIN (GLUCOPHAGE) 500 MG TABLET    TAKE 1 TABLET, ORAL, TWO TIMES DAILY   MULTIPLE VITAMIN TABLET    Take by mouth. Reported on 03/13/2015   OMEGA-3  FATTY ACIDS PO    Take by mouth.   SIMVASTATIN (ZOCOR) 20 MG TABLET    Take by mouth.   Allergies  Allergen Reactions  . Morphine Sulfate   . Penicillins Diarrhea   Past Surgical History  Procedure Laterality Date  . Abdominal hysterectomy    . Back surgery  08/08/2008    Lumbar discectomy  . Neck surgery  1980   Family History  Problem Relation Age of Onset  . Hypertension Mother   . Diabetes Mother   . Lung cancer Father    Social History   Social History  . Marital Status: Widowed    Spouse Name: N/A  . Number of Children: N/A  . Years of Education: N/A   Occupational History  . Not on file.   Social History Main  Topics  . Smoking status: Former Smoker    Quit date: 03/09/1978  . Smokeless tobacco: Never Used  . Alcohol Use: Yes     Comment: occasional  . Drug Use: No  . Sexual Activity: Not on file   Other Topics Concern  . Not on file   Social History Narrative        Objective:   Physical Exam  Constitutional: She is oriented to person, place, and time. She appears well-developed and well-nourished. No distress.  Abdominal: She exhibits distension.  Musculoskeletal: Normal range of motion. She exhibits edema (slight bilateral lower extremity edema).  Neurological: She is alert and oriented to person, place, and time. No cranial nerve deficit. She exhibits normal muscle tone.  Skin: She is not diaphoretic.  5-6cm curved wound on left lower leg with dried blood, 2-5cm ring of erythema and edema around wound, slight serosanguinous drainage, very tender to palpation, warm to touch, also area of full thickness lesion, completely de-roofed.   Psychiatric: She has a normal mood and affect.  Vitals reviewed.   BP 138/62 mmHg  Pulse 72  Temp(Src) 97.7 F (36.5 C) (Oral)  Resp 20     Assessment:    Laceartion with secondary infection.    Plan:     1. Cellulitis of left lower extremity New problem. Worsening. Will treat as below. Send for culture. Recheck in 2 days.  Call sooner if does not improve.  - Aerobic culture - doxycycline (VIBRA-TABS) 100 MG tablet; Take 1 tablet (100 mg total) by mouth 2 (two) times daily.  Dispense: 14 tablet; Refill: 0  2. Laceration Removed sutures today. Tolerated procedure well.   Margarita Rana, MD

## 2015-03-15 ENCOUNTER — Ambulatory Visit (INDEPENDENT_AMBULATORY_CARE_PROVIDER_SITE_OTHER): Payer: Medicare Other | Admitting: Family Medicine

## 2015-03-15 ENCOUNTER — Telehealth: Payer: Self-pay

## 2015-03-15 VITALS — BP 114/66 | HR 68 | Temp 97.7°F | Resp 16

## 2015-03-15 DIAGNOSIS — T148 Other injury of unspecified body region: Secondary | ICD-10-CM

## 2015-03-15 DIAGNOSIS — IMO0002 Reserved for concepts with insufficient information to code with codable children: Secondary | ICD-10-CM

## 2015-03-15 DIAGNOSIS — L03116 Cellulitis of left lower limb: Secondary | ICD-10-CM

## 2015-03-15 LAB — AEROBIC CULTURE

## 2015-03-15 NOTE — Telephone Encounter (Signed)
Advised pt of lab results. Pt verbally acknowledges understanding. Emily Drozdowski, CMA   

## 2015-03-15 NOTE — Telephone Encounter (Signed)
-----   Message from Margarita Rana, MD sent at 03/15/2015  2:06 PM EST ----- Culture came back staph, not MRSA.   Is sensitive to Doxycycline. Continue antibiotic, wound care center should be calling her for appointment for Monday. Thanks.

## 2015-03-15 NOTE — Progress Notes (Signed)
Patient ID: [name], female   DOB: 14-Sep-1931, 82 y.o.   MRN: [id number]         [name]    DOB: 1932/02/14   82 y.o.   MRN: [id number] Visit Date: 03/16/2015  Today's Provider: Margarita Rana, MD   Chief Complaint  Patient presents with  . Cellulitis   Subjective:    Laceration  The incident occurred more than 1 week ago. The laceration is located on the left leg. The laceration mechanism was a blunt object (Bumped into her dishwasher door.). The pain is mild. She reports no foreign bodies present. Her tetanus status is UTD.          Allergies  Allergen Reactions  . Morphine Sulfate   . Penicillins Diarrhea   Previous Medications   ASPIRIN 81 MG TABLET    Take by mouth.   BLOOD GLUCOSE MONITORING SUPPL (ACCU-CHEK AVIVA CONNECT) W/DEVICE KIT    ACCU-CHEK AVIVA (In Vitro Strip)  1 (one) Strip Strip daily for 0 days  Quantity: 100;  Refills: 3   Ordered :24-Oct-2013  Lynford Humphrey ;  Started 24-Oct-2013 Active Comments: Dx: 250.00 also include Accu-chek aviva meter   BUDESONIDE (ENTOCORT EC) 3 MG 24 HR CAPSULE    TAKE 3  CAPSULES  ORAL, DAILY   CALCIUM-VITAMIN D 600-200 MG-UNIT PER TABLET    Take by mouth.   CHOLECALCIFEROL (VITAMIN D) 2000 UNITS CAPS    Take by mouth.   DOXYCYCLINE (VIBRA-TABS) 100 MG TABLET    Take 1 tablet (100 mg total) by mouth 2 (two) times daily.   GLUCOSE BLOOD TEST STRIP    BAYER CONTOUR TEST (In Vitro Strip)  1 (one) Strip Strip to check blood sugar daily for 0 days  Quantity: 50;  Refills: 5   Ordered :20-May-2013  Gerald Leitz ;  Started 20-May-2013 Active Comments: & Lancets   LISINOPRIL-HYDROCHLOROTHIAZIDE (PRINZIDE,ZESTORETIC) 10-12.5 MG TABLET    1 (ONE) TABLET, ORAL, DAILY   METFORMIN (GLUCOPHAGE) 500 MG TABLET    TAKE 1 TABLET, ORAL, TWO TIMES DAILY   MULTIPLE VITAMIN TABLET    Take by mouth. Reported on 03/13/2015   OMEGA-3 FATTY ACIDS PO    Take by mouth.   SIMVASTATIN (ZOCOR) 20 MG TABLET    Take by mouth.      Review of Systems  Constitutional: Negative.   Cardiovascular: Positive for leg swelling.  Skin: Positive for wound. Negative for color change, pallor and rash.    Social History  Substance Use Topics  . Smoking status: Former Smoker    Quit date: 03/09/1978  . Smokeless tobacco: Never Used  . Alcohol Use: Yes     Comment: occasional   Objective:   BP 114/66 mmHg  Pulse 68  Temp(Src) 97.7 F (36.5 C)  Resp 16  Physical Exam  Constitutional: She is oriented to person, place, and time. She appears well-developed and well-nourished. No distress.  Musculoskeletal: Normal range of motion. She exhibits edema (slight bilateral lower extremity edema).  Neurological: She is alert and oriented to person, place, and time. No cranial nerve deficit. She exhibits normal muscle tone.  Skin: She is not diaphoretic.  4 by 5 cm curved wound on left lower leg,  ring of erythema and edema around wound has improved,  slight serosanguinous drainage,  tender to palpation, warm to touch, also area of full thickness lesion, completely de-roofed.   Psychiatric: She has a normal mood and affect.  Vitals reviewed.  Assessment & Plan:      1. Cellulitis of left lower extremity Improved,  Continue antibiotic.   - AMB referral to wound care center  2. Laceration Will refer to wound center. Is  Large de-roofed area.  Fragile skin around border. May need more aggressive treatment.   - AMB referral to wound care center     Margarita Rana, MD  [facility]

## 2015-03-16 ENCOUNTER — Encounter: Payer: Self-pay | Admitting: Family Medicine

## 2015-03-20 ENCOUNTER — Encounter: Payer: Medicare Other | Attending: Surgery | Admitting: Surgery

## 2015-03-20 DIAGNOSIS — I10 Essential (primary) hypertension: Secondary | ICD-10-CM | POA: Insufficient documentation

## 2015-03-20 DIAGNOSIS — Z87891 Personal history of nicotine dependence: Secondary | ICD-10-CM | POA: Insufficient documentation

## 2015-03-20 DIAGNOSIS — X58XXXA Exposure to other specified factors, initial encounter: Secondary | ICD-10-CM | POA: Diagnosis not present

## 2015-03-20 DIAGNOSIS — L97222 Non-pressure chronic ulcer of left calf with fat layer exposed: Secondary | ICD-10-CM | POA: Insufficient documentation

## 2015-03-20 DIAGNOSIS — E11622 Type 2 diabetes mellitus with other skin ulcer: Secondary | ICD-10-CM | POA: Diagnosis not present

## 2015-03-20 DIAGNOSIS — S81812A Laceration without foreign body, left lower leg, initial encounter: Secondary | ICD-10-CM | POA: Diagnosis not present

## 2015-03-20 DIAGNOSIS — Z880 Allergy status to penicillin: Secondary | ICD-10-CM | POA: Diagnosis not present

## 2015-03-20 DIAGNOSIS — I87312 Chronic venous hypertension (idiopathic) with ulcer of left lower extremity: Secondary | ICD-10-CM | POA: Diagnosis not present

## 2015-03-20 DIAGNOSIS — M1990 Unspecified osteoarthritis, unspecified site: Secondary | ICD-10-CM | POA: Insufficient documentation

## 2015-03-21 DIAGNOSIS — S81802A Unspecified open wound, left lower leg, initial encounter: Secondary | ICD-10-CM | POA: Diagnosis not present

## 2015-03-21 NOTE — Progress Notes (Signed)
[name], [name] ([id number]) Visit Report for 03/20/2015 Abuse/Suicide Risk Screen Details Patient Name: [name], [name] 03/20/2015 12:45 Date of Service: PM Medical Record [id number] Number: Patient Account Number: [id number] 1931/10/04 (82 y.o. Treating RN: Montey Hora Date of Birth/Sex: Female) Other Clinician: Primary Care Physician: Margarita Rana Treating Christin Fudge Referring Physician: Margarita Rana Physician/Extender: Suella Grove in Treatment: 0 Abuse/Suicide Risk Screen Items Answer ABUSE/SUICIDE RISK SCREEN: Has anyone close to you tried to hurt or harm you recentlyo No Do you feel uncomfortable with anyone in your familyo No Has anyone forced you do things that you didnot want to doo No Do you have any thoughts of harming yourselfo No Patient displays signs or symptoms of abuse and/or neglect. No Electronic Signature(s) Signed: 03/20/2015 5:36:03 PM By: Montey Hora Entered By: Montey Hora on 03/20/2015 13:12:24 [name] ([id number]) -------------------------------------------------------------------------------- Activities of Daily Living Details Patient Name: [name], [name] 03/20/2015 12:45 Date of Service: PM Medical Record [id number] Number: Patient Account Number: [id number] Jul 23, 1931 (83 y.o. Treating RN: Montey Hora Date of Birth/Sex: Female) Other Clinician: Primary Care Physician: Margarita Rana Treating Christin Fudge Referring Physician: Margarita Rana Physician/Extender: Suella [place] in Treatment: 0 Activities of Daily Living Items Answer Activities of Daily Living (Please select one for each item) Drive Automobile Completely Able Take Medications Completely Able Use Telephone Completely Able Care for Appearance Completely Able Use Toilet Completely Able Bath / Shower Completely Able Dress Self Completely Able Feed Self Completely Able Walk Completely Able Get In / Out Bed Completely Able Housework Completely Able Prepare  Meals Completely [place] for Self Completely Able Electronic Signature(s) Signed: 03/20/2015 5:36:03 PM By: Montey Hora Entered By: Montey Hora on 03/20/2015 13:12:43 [name] ([id number]) -------------------------------------------------------------------------------- Education Assessment Details Patient Name: [name] 03/20/2015 12:45 Date of Service: PM Medical Record [id number] Number: Patient Account Number: [id number] Jan 27, 1932 (82 y.o. Treating RN: Montey Hora Date of Birth/Sex: Female) Other Clinician: Primary Care Physician: Margarita Rana Treating Christin Fudge Referring Physician: Margarita Rana Physician/Extender: Suella [place] in Treatment: 0 Primary Learner Assessed: Patient Learning Preferences/Education Level/Primary Language Learning Preference: Explanation, Demonstration, Printed Material Highest Education Level: College or Above Preferred Language: English Cognitive Barrier Assessment/Beliefs Language Barrier: No Translator Needed: No Memory Deficit: No Emotional Barrier: No Cultural/Religious Beliefs Affecting Medical No Care: Physical Barrier Assessment Impaired Vision: No Impaired Hearing: No Decreased Hand dexterity: No Knowledge/Comprehension Assessment Knowledge Level: Medium Comprehension Level: Medium Ability to understand written Medium instructions: Ability to understand verbal Medium instructions: Motivation Assessment Anxiety Level: Calm Cooperation: Cooperative Education Importance: Acknowledges Need Interest in Health Problems: Asks Questions Perception: Coherent Willingness to Engage in Self- Medium Management Activities: Readiness to Engage in Self- Medium Management Activities: [name], [name] ([id number]) Electronic Signature(s) Signed: 03/20/2015 5:36:03 PM By: Montey Hora Entered By: Montey Hora on 03/20/2015 13:13:25 [name], [name]  ([id number]) -------------------------------------------------------------------------------- Fall Risk Assessment Details Patient Name: [name] 03/20/2015 12:45 Date of Service: PM Medical Record [id number] Number: Patient Account Number: [id number] 28-Jan-1932 (82 y.o. Treating RN: Montey Hora Date of Birth/Sex: Female) Other Clinician: Primary Care Physician: Margarita Rana Treating Christin Fudge Referring Physician: Margarita Rana Physician/Extender: Suella [place] in Treatment: 0 Fall Risk Assessment Items Have you had 2 or more falls in the last 12 monthso 0 No Have you had any fall that resulted in injury in the last 12 monthso 0 No FALL RISK ASSESSMENT: History of falling - immediate or within 3 months 25 Yes Secondary diagnosis 0 No Ambulatory aid None/bed rest/wheelchair/nurse 0 Yes Crutches/cane/walker 0  No Furniture 0 No IV Access/Saline Lock 0 No Gait/Training Normal/bed rest/immobile 0 Yes Weak 0 No Impaired 0 No Mental Status Oriented to own ability 0 Yes Electronic Signature(s) Signed: 03/20/2015 5:36:03 PM By: Montey Hora Entered By: Montey Hora on 03/20/2015 13:14:50 [name], [name] ([id number]) -------------------------------------------------------------------------------- Foot Assessment Details Patient Name: [name]. 03/20/2015 12:45 Date of Service: PM Medical Record [id number] Number: Patient Account Number: [id number] 07-Dec-1931 (82 y.o. Treating RN: Montey Hora Date of Birth/Sex: Female) Other Clinician: Primary Care Physician: Margarita Rana Treating Christin Fudge Referring Physician: Margarita Rana Physician/Extender: Suella [place] in Treatment: 0 Foot Assessment Items Site Locations + = Sensation present, - = Sensation absent, C = Callus, U = Ulcer R = Redness, W = Warmth, M = Maceration, PU = Pre-ulcerative lesion F = Fissure, S = Swelling, D = Dryness Assessment Right: Left: Other Deformity: No No Prior Foot Ulcer:  No No Prior Amputation: No No Charcot Joint: No No Ambulatory Status: Ambulatory Without Help Gait: Steady Electronic Signature(s) Signed: 03/20/2015 5:36:03 PM By: Montey Hora Entered By: Montey Hora on 03/20/2015 13:15:22 [name], [name] ([id number]) -------------------------------------------------------------------------------- Nutrition Risk Assessment Details Patient Name: [name] 03/20/2015 12:45 Date of Service: PM Medical Record [id number] Number: Patient Account Number: [id number] 1931-10-02 (82 y.o. Treating RN: Montey Hora Date of Birth/Sex: Female) Other Clinician: Primary Care Physician: Margarita Rana Treating Christin Fudge Referring Physician: Margarita Rana Physician/Extender: Suella [place] in Treatment: 0 Height (in): 61 Weight (lbs): Body Mass Index (BMI): Nutrition Risk Assessment Items NUTRITION RISK SCREEN: I have an illness or condition that made me change the kind and/or 0 No amount of food I eat I eat fewer than two meals per day 0 No I eat few fruits and vegetables, or milk products 0 No I have three or more drinks of beer, liquor or wine almost every day 0 No I have tooth or mouth problems that make it hard for me to eat 0 No I don't always have enough money to buy the food I need 0 No I eat alone most of the time 0 No I take three or more different prescribed or over-the-counter drugs a 1 Yes day Without wanting to, I have lost or gained 10 pounds in the last six 0 No months I am not always physically able to shop, cook and/or feed myself 0 No Nutrition Protocols Good Risk Protocol 0 No interventions needed Moderate Risk Protocol Electronic Signature(s) Signed: 03/20/2015 5:36:03 PM By: Montey Hora Entered By: Montey Hora on 03/20/2015 13:14:59

## 2015-03-21 NOTE — Progress Notes (Signed)
[name], [name] ([id number]) Visit Report for 03/20/2015 Allergy List Details Patient Name: [name], [name] Date of Service: 03/20/2015 12:45 PM Medical Record Number: [id number] Patient Account Number: [id number] Date of Birth/Sex: 30-Dec-1931 (83 y.o. Female) Treating RN: [name] Primary Care Physician: [name] Other Clinician: Referring Physician: Margarita [name] Treating Physician/Extender: [name] in Treatment: 0 Allergies Active Allergies morphine HCl penicillin Allergy Notes Electronic Signature(s) Signed: 03/20/2015 5:36:03 PM By: [name] Entered By: [name] on 03/20/2015 13:07:03 [name] ([id number]) -------------------------------------------------------------------------------- Arrival Information Details Patient Name: [name] Date of Service: 03/20/2015 12:45 PM Medical Record Number: [id number] Patient Account Number: [id number] Date of Birth/Sex: [birth year] (83 y.o. Female) Treating RN: [name] Primary Care Physician: [name] Other Clinician: Referring Physician: Margarita [name] Treating Physician/Extender: [name] in Treatment: 0 Visit Information Patient Arrived: Ambulatory Arrival Time: 13:00 Accompanied By: self Transfer Assistance: None Patient Identification Verified: Yes Secondary Verification Process Yes Completed: Patient Has Alerts: Yes Patient Alerts: DMII Electronic Signature(s) Signed: 03/20/2015 5:36:03 PM By: [name] Entered By: [name] on 03/20/2015 13:02:02 [name] ([id number]) -------------------------------------------------------------------------------- Clinic Level of Care Assessment Details Patient Name: [name] Date of Service: 03/20/2015 12:45 PM Medical Record Number: [id number] Patient Account Number: [id number] Date of Birth/Sex: [birth year] (83 y.o. Female) Treating RN: [name] Primary Care Physician:  [name] Other Clinician: Referring Physician: Margarita [name] Treating Physician/Extender: [name] in Treatment: 0 Clinic Level of Care Assessment Items TOOL 1 Quantity Score []  - Use when EandM and Procedure is performed on INITIAL visit 0 ASSESSMENTS - Nursing Assessment / Reassessment X - General Physical Exam (combine w/ comprehensive assessment (listed just 1 20 below) when performed on new pt. evals) X - Comprehensive Assessment (HX, ROS, Risk Assessments, Wounds Hx, etc.) 1 25 ASSESSMENTS - Wound and Skin Assessment / Reassessment []  - Dermatologic / Skin Assessment (not related to wound area) 0 ASSESSMENTS - Ostomy and/or Continence Assessment and Care []  - Incontinence Assessment and Management 0 []  - Ostomy Care Assessment and Management (repouching, etc.) 0 PROCESS - Coordination of Care X - Simple Patient / Family Education for ongoing care 1 15 []  - Complex (extensive) Patient / Family Education for ongoing care 0 X - Staff obtains Programmer, systems, Records, Test Results / Process Orders 1 10 []  - Staff telephones HHA, Nursing Homes / Clarify orders / etc 0 []  - Routine Transfer to another Facility (non-emergent condition) 0 []  - Routine Hospital Admission (non-emergent condition) 0 X - New Admissions / Biomedical engineer / Ordering NPWT, Apligraf, etc. 1 15 []  - Emergency Hospital Admission (emergent condition) 0 PROCESS - Special Needs []  - Pediatric / Minor Patient Management 0 []  - Isolation Patient Management 0 [name], [name] ([id number]) []  - Hearing / Language / Visual special needs 0 []  - Assessment of Community assistance (transportation, D/C planning, etc.) 0 []  - Additional assistance / Altered mentation 0 []  - Support Surface(s) Assessment (bed, cushion, seat, etc.) 0 INTERVENTIONS - Miscellaneous []  - External ear exam 0 []  - Patient Transfer (multiple staff / Civil Service fast streamer / Similar devices) 0 []  - Simple Staple / Suture removal (25 or  less) 0 []  - Complex Staple / Suture removal (26 or more) 0 []  - Hypo/Hyperglycemic Management (do not check if billed separately) 0 X - Ankle / Brachial Index (ABI) - do not check if billed separately 1 15 Has the patient been seen at the hospital within the last three years: Yes Total Score: 100  Level Of Care: New/Established - Level 3 Electronic Signature(s) Signed: 03/20/2015 5:36:03 PM By: [name] Entered By: [name] on 03/20/2015 14:00:33 [name], [name] ([id number]) -------------------------------------------------------------------------------- Encounter Discharge Information Details Patient Name: [name] Date of Service: 03/20/2015 12:45 PM Medical Record Number: [id number] Patient Account Number: [id number] Date of Birth/Sex: 1932/01/03 (83 y.o. Female) Treating RN: [name] Primary Care Physician: [name] Other Clinician: Referring Physician: Margarita [name] Treating Physician/Extender: [name] in Treatment: 0 Encounter Discharge Information Items Discharge Pain Level: 0 Discharge Condition: Stable Ambulatory Status: Ambulatory Discharge Destination: Home Transportation: Private Auto Accompanied By: self Schedule Follow-up Appointment: Yes Medication Reconciliation completed and provided to Patient/Care No [name]: Provided on Clinical Summary of Care: 03/20/2015 Form Type Recipient Paper Patient [facility] Electronic Signature(s) Signed: 03/20/2015 2:16:31 PM By: [name] Entered By: [name] on 03/20/2015 14:16:31 [name] ([id number]) -------------------------------------------------------------------------------- Lower Extremity Assessment Details Patient Name: [name] Date of Service: 03/20/2015 12:45 PM Medical Record Number: [id number] Patient Account Number: [id number] Date of Birth/Sex: Aug 02, 1931 (83 y.o. Female) Treating RN: [name] Primary Care Physician: [name] Other Clinician: Referring Physician: Margarita [name] Treating Physician/Extender: [name] in Treatment: 0 Edema Assessment Assessed: [Left: No] [Right: No] Edema: [Left: Ye] [Right: s] Calf Left: Right: Point of Measurement: 30 cm From Medial Instep 38.5 cm cm Ankle Left: Right: Point of Measurement: 10 cm From Medial Instep 22.3 cm cm Vascular Assessment Pulses: Posterior Tibial Palpable: [Left:Yes] [Right:Yes] Doppler: [Left:Multiphasic] [Right:Multiphasic] Dorsalis Pedis Palpable: [Left:Yes] [Right:Yes] Doppler: [Left:Multiphasic] [Right:Multiphasic] Extremity colors, hair growth, and conditions: Extremity Color: [Left:Mottled] [Right:Mottled] Hair Growth on Extremity: [Left:No] [Right:No] Temperature of Extremity: [Left:Warm] [Right:Warm] Capillary Refill: [Left:< 3 seconds] [Right:< 3 seconds] Blood Pressure: Brachial: [Right:144] Dorsalis Pedis: [Left:Dorsalis Pedis: [id number] Ankle: Posterior Tibial: [Left:Posterior Tibial: 150] [Right:1.04] Toe Nail Assessment Left: Right: Thick: No Discolored: No Deformed: No Improper Length and Hygiene: No [place], [place] ([id number]) Notes NO ABI L LEG R/T PAIN FROM WOUND LOCATION Electronic Signature(s) Signed: 03/20/2015 5:36:03 PM By: [name] Entered By: [name] on 03/20/2015 13:31:55 [name], [name] ([id number]) -------------------------------------------------------------------------------- Multi Wound Chart Details Patient Name: [name] Date of Service: 03/20/2015 12:45 PM Medical Record Number: [id number] Patient Account Number: [id number] Date of Birth/Sex: [birth year] (83 y.o. Female) Treating RN: [name] Primary Care Physician: [name] Other Clinician: Referring Physician: Margarita [name] Treating Physician/Extender: [name] in Treatment: 0 Vital Signs Height(in): 61 Pulse(bpm): 68 Weight(lbs): Blood Pressure 150/68 (mmHg): Body Mass  Index(BMI): Temperature(F): 97.6 Respiratory Rate 18 (breaths/min): Photos: [1:No Photos] [N/A:N/A] Wound Location: [1:Left Lower Leg - Anterior] [N/A:N/A] Wounding Event: [1:Trauma] [N/A:N/A] Primary Etiology: [1:Trauma, Other] [N/A:N/A] Comorbid History: [1:Hypertension, Colitis, Type II Diabetes, Osteoarthritis] [N/A:N/A] Date Acquired: [1:02/28/2015] [N/A:N/A] Weeks of Treatment: [1:0] [N/A:N/A] Wound Status: [1:Open] [N/A:N/A] Measurements L x W x D 2.6x4.2x0.2 [N/A:N/A] (cm) Area (cm) : [1:8.577] [N/A:N/A] Volume (cm) : [1:1.715] [N/A:N/A] Classification: [1:Full Thickness Without Exposed Support Structures] [N/A:N/A] HBO Classification: [1:Grade 1] [N/A:N/A] Exudate Amount: [1:Medium] [N/A:N/A] Exudate Type: [1:Serous] [N/A:N/A] Exudate Color: [1:amber] [N/A:N/A] Wound Margin: [1:Flat and Intact] [N/A:N/A] Granulation Amount: [1:Medium (34-66%)] [N/A:N/A] Granulation Quality: [1:Red, Pink] [N/A:N/A] Necrotic Amount: [1:Medium (34-66%)] [N/A:N/A] Necrotic Tissue: [1:Eschar, Adherent Slough] [N/A:N/A] Exposed Structures: [1:Fascia: No Fat: No Tendon: No Muscle: No] [N/A:N/A] Joint: No Bone: No Limited to Skin Breakdown Epithelialization: None N/A N/A Periwound Skin Texture: Edema: Yes N/A N/A Excoriation: No Induration: No Callus: No Crepitus: No Fluctuance: No Friable: No Rash: No Scarring: No Periwound Skin Maceration: Yes N/A N/A  Moisture: Moist: Yes Dry/Scaly: No Periwound Skin Color: Erythema: Yes N/A N/A Atrophie Blanche: No Cyanosis: No Ecchymosis: No Hemosiderin Staining: No Mottled: No Pallor: No Rubor: No Erythema Location: Circumferential N/A N/A Temperature: No Abnormality N/A N/A Tenderness on Yes N/A N/A Palpation: Wound Preparation: Ulcer Cleansing: N/A N/A Rinsed/Irrigated with Saline Topical Anesthetic Applied: Other: lidocaine 4% Treatment Notes Electronic Signature(s) Signed: 03/20/2015 5:36:03 PM By: [name] Entered By: [name] on 03/20/2015 13:53:43 [name], [name] ([id number]) -------------------------------------------------------------------------------- Multi-Disciplinary Care Plan Details Patient Name: [name] Date of Service: 03/20/2015 12:45 PM Medical Record Number: [id number] Patient Account Number: [id number] Date of Birth/Sex: Jul 10, 1931 (83 y.o. Female) Treating RN: [name] Primary Care Physician: [name] Other Clinician: Referring Physician: Margarita [name] Treating Physician/Extender: [name] in Treatment: 0 Active Inactive Abuse / Safety / Falls / Self Care Management Nursing Diagnoses: Impaired physical mobility Potential for falls Goals: Patient will remain injury free Date Initiated: 03/20/2015 Goal Status: Active Interventions: Assess fall risk on admission and as needed Notes: Orientation to the Wound Care Program Nursing Diagnoses: Knowledge deficit related to the wound healing center program Goals: Patient/caregiver will verbalize understanding of the [place] Program Date Initiated: 03/20/2015 Goal Status: Active Interventions: Provide education on orientation to the wound center Notes: Wound/Skin Impairment Nursing Diagnoses: Impaired tissue integrity Goals: Patient/caregiver will verbalize understanding of skin care regimen [name], [name] ([id number]) Date Initiated: 03/20/2015 Goal Status: Active Ulcer/skin breakdown will have a volume reduction of 30% by week 4 Date Initiated: 03/20/2015 Goal Status: Active Ulcer/skin breakdown will have a volume reduction of 50% by week 8 Date Initiated: 03/20/2015 Goal Status: Active Ulcer/skin breakdown will have a volume reduction of 80% by week 12 Date Initiated: 03/20/2015 Goal Status: Active Ulcer/skin breakdown will heal within 14 weeks Date Initiated: 03/20/2015 Goal Status: Active Interventions: Assess patient/caregiver ability to  obtain necessary supplies Assess patient/caregiver ability to perform ulcer/skin care regimen upon admission and as needed Assess ulceration(s) every visit Notes: Electronic Signature(s) Signed: 03/20/2015 5:36:03 PM By: [name] Entered By: [name] on 03/20/2015 13:52:58 [name] ([id number]) -------------------------------------------------------------------------------- Patient/Caregiver Education Details Patient Name: [name] Date of Service: 03/20/2015 12:45 PM Medical Record Number: [id number] Patient Account Number: [id number] Date of Birth/Gender: 17-May-1931 (83 y.o. Female) Treating RN: [name] Primary Care Physician: [name] Other Clinician: Referring Physician: Margarita [name] Treating Physician/Extender: [name] in Treatment: 0 Education Assessment Education Provided To: Patient Education Topics Provided Wound/Skin Impairment: Handouts: Other: wound care as ordered Methods: Demonstration, Explain/Verbal Responses: State content correctly Electronic Signature(s) Signed: 03/20/2015 5:36:03 PM By: [name] Entered By: [name] on 03/20/2015 14:17:56 [name], [name] ([id number]) -------------------------------------------------------------------------------- Wound Assessment Details Patient Name: [name] Date of Service: 03/20/2015 12:45 PM Medical Record Number: [id number] Patient Account Number: [id number] Date of Birth/Sex: 28-Jan-1932 (83 y.o. Female) Treating RN: [name] Primary Care Physician: [name] Other Clinician: Referring Physician: Margarita [name] Treating Physician/Extender: [name] in Treatment: 0 Wound Status Wound Number: 1 Primary Trauma, Other Etiology: Wound Location: Left Lower Leg - Anterior Wound Open Wounding Event: Trauma Status: Date Acquired: 02/28/2015 Comorbid Hypertension, Colitis, Type II Weeks Of Treatment: 0 History:  Diabetes, Osteoarthritis Clustered Wound: No Photos Photo Uploaded By: [name] on 03/20/2015 15:14:13 Wound Measurements Length: (cm) 2.6 Width: (cm) 4.2 Depth: (cm) 0.2 Area: (cm) 8.577 Volume: (cm) 1.715 % Reduction in Area: % Reduction in Volume: Epithelialization: None Tunneling: No Undermining: No Wound Description Full Thickness Without Classification: Exposed Support  Structures Diabetic Severity Grade 1 (Wagner): Wound Margin: Flat and Intact Exudate Amount: Medium Exudate Type: Serous Exudate Color: amber Foul Odor After Cleansing: No Wound Bed Granulation Amount: Medium (34-66%) Exposed Structure Granulation Quality: Red, Pink Fascia Exposed: No [name], [name] ([id number]) Necrotic Amount: Medium (34-66%) Fat Layer Exposed: No Necrotic Quality: Eschar, Adherent Slough Tendon Exposed: No Muscle Exposed: No Joint Exposed: No Bone Exposed: No Limited to Skin Breakdown Periwound Skin Texture Texture Color No Abnormalities Noted: No No Abnormalities Noted: No Callus: No Atrophie Blanche: No Crepitus: No Cyanosis: No Excoriation: No Ecchymosis: No Fluctuance: No Erythema: Yes Friable: No Erythema Location: Circumferential Induration: No Hemosiderin Staining: No Localized Edema: Yes Mottled: No Rash: No Pallor: No Scarring: No Rubor: No Moisture Temperature / Pain No Abnormalities Noted: No Temperature: No Abnormality Dry / Scaly: No Tenderness on Palpation: Yes Maceration: Yes Moist: Yes Wound Preparation Ulcer Cleansing: Rinsed/Irrigated with Saline Topical Anesthetic Applied: Other: lidocaine 4%, Treatment Notes Wound #1 (Left, Anterior Lower Leg) 1. Cleansed with: Clean wound with Normal Saline 2. Anesthetic Topical Lidocaine 4% cream to wound bed prior to debridement 3. Peri-wound Care: Skin Prep 4. Dressing Applied: Medihoney Gel 5. Secondary Dressing Applied Bordered Foam Dressing Non-Adherent pad Electronic  Signature(s) Signed: 03/20/2015 5:36:03 PM By: [name] Entered By: [name] on 03/20/2015 13:22:01 [name], [name] ([id number]) [name], HORNG ([id number]) -------------------------------------------------------------------------------- [place] Details Patient Name: [name] Date of Service: 03/20/2015 12:45 PM Medical Record Number: [id number] Patient Account Number: [id number] Date of Birth/Sex: [birth year] (83 y.o. Female) Treating RN: [name] Primary Care Physician: [name] Other Clinician: Referring Physician: Margarita [name] Treating Physician/Extender: [name] in Treatment: 0 Vital Signs Time Taken: 13:02 Temperature (F): 97.6 Height (in): 61 Pulse (bpm): 68 Source: Stated Respiratory Rate (breaths/min): 18 Blood Pressure (mmHg): 150/68 Reference Range: 80 - 120 mg / dl Electronic Signature(s) Signed: 03/20/2015 5:36:03 PM By: [name] Entered By: [name] on 03/20/2015 13:04:48

## 2015-03-21 NOTE — Progress Notes (Signed)
[name], [name] ([id number]) Visit Report for 03/20/2015 Chief Complaint Document Details Patient Name: [name], [name] 03/20/2015 12:45 Date of Service: PM Medical Record [id number] Number: Patient Account Number: [id number] 10-20-1931 (82 y.o. Treating RN: Montey Hora Date of Birth/Sex: Female) Other Clinician: Primary Care Physician: Margarita Rana Treating Christin Fudge Referring Physician: Margarita Rana Physician/Extender: Suella Grove in Treatment: 0 Information Obtained from: Patient Chief Complaint Patient presents to the wound care center for a consult due non healing wound to the left lower extremity which she's had for about 3 weeks now Electronic Signature(s) Signed: 03/20/2015 2:06:25 PM By: Christin Fudge MD, FACS Entered By: Christin Fudge on 03/20/2015 14:06:25 [name] ([id number]) -------------------------------------------------------------------------------- Debridement Details Patient Name: [name]. 03/20/2015 12:45 Date of Service: PM Medical Record [id number] Number: Patient Account Number: [id number] 1932/02/05 (82 y.o. Treating RN: Montey Hora Date of Birth/Sex: Female) Other Clinician: Primary Care Physician: Margarita Rana Treating Christin Fudge Referring Physician: Margarita Rana Physician/Extender: Suella Grove in Treatment: 0 Debridement Performed for Wound #1 Left,Anterior Lower Leg Assessment: Performed By: Physician Christin Fudge, MD Debridement: Debridement Pre-procedure Yes Verification/Time Out Taken: Start Time: 13:56 Pain Control: Lidocaine 4% Topical Solution Level: Skin/Subcutaneous Tissue Total Area Debrided (L x 2.6 (cm) x 4.2 (cm) = 10.92 (cm) W): Tissue and other Viable, Non-Viable, Fibrin/Slough, Subcutaneous material debrided: Instrument: Curette Bleeding: Minimum Hemostasis Achieved: Pressure End Time: 13:58 Procedural Pain: 0 Post Procedural Pain: 0 Response to Treatment: Procedure was tolerated  well Post Debridement Measurements of Total Wound Length: (cm) 2.6 Width: (cm) 4.2 Depth: (cm) 0.2 Volume: (cm) 1.715 Post Procedure Diagnosis Same as Pre-procedure Electronic Signature(s) Signed: 03/20/2015 2:06:05 PM By: Christin Fudge MD, FACS Signed: 03/20/2015 5:36:03 PM By: Montey Hora Entered By: Christin Fudge on 03/20/2015 14:06:05 [name] ([id number]) -------------------------------------------------------------------------------- HPI Details Patient Name: [name]. 03/20/2015 12:45 Date of Service: PM Medical Record [id number] Number: Patient Account Number: [id number] 1932-01-05 (82 y.o. Treating RN: Montey Hora Date of Birth/Sex: Female) Other Clinician: Primary Care Physician: Margarita Rana Treating Christin Fudge Referring Physician: Margarita Rana Physician/Extender: Suella Grove in Treatment: 0 History of Present Illness Location: lacerated wound to the left lower extremity Quality: Patient reports experiencing a dull pain to affected area(s). Severity: Patient states wound (s) are getting better. Duration: Patient has had the wound for < 3 weeks prior to presenting for treatment Timing: Pain in wound is Intermittent (comes and goes Context: The wound occurred when the patient had a blunt injury which caused a laceration to the left lower extremity Modifying Factors: Other treatment(s) tried include:had sutures placed in the ER and then had an infected wound which was treated with doxycycline Associated Signs and Symptoms: Patient reports having increase swelling. HPI Description: 82 year old patient who had an injury to her left lower extremity on December 21 and went to the ED and had sutures and Steri-Strips applied and tetanus shot given but no antibiotics. She was then seen by her PCP on 03/13/2015 and at that time was noted to have atelectasis of the left lower extremity and hence was put on doxycycline 100 mg twice a day for 7 days. The  sutures were removed and the wound was found to be gaping. past medical is trace significant for diabetes mellitus, arthritis, basal cell carcinoma, collagenous colitis, essential hypertension, avitaminosis D. she is also status post abdominal hysterectomy, lumbar discectomy, neck surgery. She is a former smoker and quit in 1979. of note in the recent past she was seen by Dr. Lucky Cowboy for what seems a sounds like  injections of varicose veins. this was only around her ankles and not in the lower extremities. No Doppler studies were done. She was not advised to wear any compression stockings. Electronic Signature(s) Signed: 03/20/2015 2:08:13 PM By: Christin Fudge MD, FACS Previous Signature: 03/20/2015 1:25:51 PM Version By: Christin Fudge MD, FACS Previous Signature: 03/20/2015 1:21:21 PM Version By: Christin Fudge MD, FACS Entered By: Christin Fudge on 03/20/2015 14:08:13 [name], [name] ([id number]) -------------------------------------------------------------------------------- Physical Exam Details Patient Name: [name], [name] 03/20/2015 12:45 Date of Service: PM Medical Record [id number] Number: Patient Account Number: [id number] 04-24-1931 (82 y.o. Treating RN: Montey Hora Date of Birth/Sex: Female) Other Clinician: Primary Care Physician: Margarita Rana Treating Christin Fudge Referring Physician: Margarita Rana Physician/Extender: Weeks in Treatment: 0 Constitutional . Pulse regular. Respirations normal and unlabored. Afebrile. . Eyes Nonicteric. Reactive to light. Ears, Nose, Mouth, and Throat Lips, teeth, and gums WNL.Marland Kitchen Moist mucosa without lesions. Neck supple and nontender. No palpable supraclavicular or cervical adenopathy. Normal sized without goiter. Respiratory WNL. No retractions.. Breath sounds WNL, No rubs, rales, rhonchi, or wheeze.. Cardiovascular ABI on the left could not be measured because of tenderness but ABI on the right was 1.04.. the patient  has stigmata of varicose veins and has some edema of the left lower extremity.. Gastrointestinal (GI) Abdomen without masses or tenderness.. No liver or spleen enlargement or tenderness.. Lymphatic No adneopathy. No adenopathy. No adenopathy. Musculoskeletal Adexa without tenderness or enlargement.. Digits and nails w/o clubbing, cyanosis, infection, petechiae, ischemia, or inflammatory conditions.. Integumentary (Hair, Skin) No suspicious lesions. No crepitus or fluctuance. No peri-wound warmth or erythema. No masses.Marland Kitchen Psychiatric Judgement and insight Intact.. No evidence of depression, anxiety, or agitation.. Notes she has a healing lacerated wound on the left lower extremity in the region of the mid shin with subcutaneous tissue and debris which was sharply debrided with a curette. No evidence of cellulitis. Electronic Signature(s) Signed: 03/20/2015 2:09:30 PM By: Christin Fudge MD, FACS Entered By: Christin Fudge on 03/20/2015 14:09:29 [name], [name] ([id number]) -------------------------------------------------------------------------------- Physician Orders Details Patient Name: [name], [name] 03/20/2015 12:45 Date of Service: PM Medical Record [id number] Number: Patient Account Number: [id number] February 03, 1932 (82 y.o. Treating RN: Montey Hora Date of Birth/Sex: Female) Other Clinician: Primary Care Physician: Margarita Rana Treating Christin Fudge Referring Physician: Margarita Rana Physician/Extender: Suella Grove in Treatment: 0 Verbal / Phone Orders: Yes Clinician: Montey Hora Read Back and Verified: Yes Diagnosis Coding Wound Cleansing Wound #1 Left,Anterior Lower Leg o Clean wound with Normal Saline. o May Shower, gently pat wound dry prior to applying new dressing. Skin Barriers/Peri-Wound Care Wound #1 Left,Anterior Lower Leg o Skin Prep Primary Wound Dressing Wound #1 Left,Anterior Lower Leg o Medihoney gel Secondary Dressing Wound #1  Left,Anterior Lower Leg o Boardered Foam Dressing o Non-adherent pad Dressing Change Frequency Wound #1 Left,Anterior Lower Leg o Change dressing every day. Follow-up Appointments Wound #1 Left,Anterior Lower Leg o Return Appointment in 1 week. Edema Control Wound #1 Left,Anterior Lower Leg o Elevate legs to the level of the heart and pump ankles as often as possible Electronic Signature(s) Signed: 03/20/2015 4:32:01 PM By: Christin Fudge MD, FACS [name], [name] ([id number]) Signed: 03/20/2015 5:36:03 PM By: Montey Hora Entered By: Montey Hora on 03/20/2015 14:00:13 [name], [name] ([id number]) -------------------------------------------------------------------------------- Problem List Details Patient Name: [name], [name] 03/20/2015 12:45 Date of Service: PM Medical Record [id number] Number: Patient Account Number: [id number] 1931-12-20 (82 y.o. 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 Treating RN: Montey Hora Date of Birth/Sex: Female) Other Clinician: Primary Care Physician: Margarita Rana Treating Christin Fudge Referring Physician: Margarita Rana Physician/Extender: Suella Grove in Treatment: 0 Subjective Chief Complaint Information obtained from Patient Patient presents to the wound care center for a consult due non healing wound to the left lower extremity which she's had for about 3 weeks now History of Present Illness (HPI) The following HPI elements were documented for the patient's wound: Location: lacerated wound to the left lower extremity Quality: Patient reports experiencing a dull pain to affected area(s). Severity: Patient states wound (s) are getting better. Duration: Patient has had the wound for < 3 weeks prior to presenting for treatment Timing: Pain in wound is Intermittent (comes and goes Context: The wound occurred when the patient had a blunt injury which caused a laceration to the left lower extremity Modifying Factors: Other treatment(s) tried include:had sutures placed in the ER and then had an infected wound which was treated with doxycycline Associated Signs and Symptoms: Patient reports having increase swelling. 82 year old patient who had an injury to her left lower extremity on December 21 and went to the ED and had sutures and Steri-Strips applied and tetanus shot given but no antibiotics. She was then seen by her PCP on 03/13/2015 and at that time was noted to have atelectasis of the left lower extremity and hence was put on doxycycline 100 mg twice a day for 7 days. The sutures were removed and the wound was found to be gaping. past medical is trace significant for diabetes mellitus, arthritis, basal cell carcinoma, collagenous colitis, essential hypertension, avitaminosis D. she is also status post abdominal hysterectomy, lumbar discectomy, neck surgery.  She is a former smoker and quit in 1979. of note in the recent past she was seen by Dr. Lucky Cowboy for what seems a sounds like injections of varicose veins. this was only around her ankles and not in the lower extremities. No Doppler studies were done. She was not advised to wear any compression stockings. Wound History Patient presents with 1 open wound that has been present for approximately 3 weeks. Patient has been treating wound in the following manner: cleaning with saline, vaseline gazue and wrapping. Laboratory tests have not been performed in the last month. Patient reportedly has not tested positive for an antibiotic resistant organism. Patient reportedly has not tested positive for osteomyelitis. Patient reportedly has not [name], [name] ([id number]) had testing performed to evaluate circulation in the legs. Patient History Information obtained from Patient. Allergies morphine HCl, penicillin Social History Never smoker, Marital Status - Widowed, Alcohol Use - Never, Drug Use - No History, Caffeine Use - Never. Medical History Cardiovascular Patient has history of Hypertension Gastrointestinal Patient has history of Colitis Endocrine Patient has history of Type II Diabetes Musculoskeletal Patient has history  of Osteoarthritis Oncologic Denies history of Received Chemotherapy, Received Radiation Patient is treated with Oral Agents. Blood sugar is not tested. Medical And Surgical History Notes Integumentary (Skin) senile purpura, acne erythematosa Review of Systems (ROS) Constitutional Symptoms (General Health) The patient has no complaints or symptoms. Eyes Complains or has symptoms of Glasses / Contacts - glasses. Ear/Nose/Mouth/Throat The patient has no complaints or symptoms. Hematologic/Lymphatic The patient has no complaints or symptoms. Respiratory The patient has no complaints or symptoms. Cardiovascular Complains or has symptoms of LE  edema. Gastrointestinal The patient has no complaints or symptoms. Endocrine The patient has no complaints or symptoms. Genitourinary The patient has no complaints or symptoms. Immunological [name], [name] ([id number]) The patient has no complaints or symptoms. Integumentary (Skin) The patient has no complaints or symptoms. Musculoskeletal The patient has no complaints or symptoms. Neurologic The patient has no complaints or symptoms. Oncologic The patient has no complaints or symptoms. Psychiatric The patient has no complaints or symptoms. Objective Constitutional Pulse regular. Respirations normal and unlabored. Afebrile. Vitals Time Taken: 1:02 PM, Height: 61 in, Source: Stated, Temperature: 97.6 F, Pulse: 68 bpm, Respiratory Rate: 18 breaths/min, Blood Pressure: 150/68 mmHg. Eyes Nonicteric. Reactive to light. Ears, Nose, Mouth, and Throat Lips, teeth, and gums WNL.Marland Kitchen Moist mucosa without lesions. Neck supple and nontender. No palpable supraclavicular or cervical adenopathy. Normal sized without goiter. Respiratory WNL. No retractions.. Breath sounds WNL, No rubs, rales, rhonchi, or wheeze.. Cardiovascular ABI on the left could not be measured because of tenderness but ABI on the right was 1.04.. the patient has stigmata of varicose veins and has some edema of the left lower extremity.. Gastrointestinal (GI) Abdomen without masses or tenderness.. No liver or spleen enlargement or tenderness.. Lymphatic No adneopathy. No adenopathy. No adenopathy. Musculoskeletal Adexa without tenderness or enlargement.. Digits and nails w/o clubbing, cyanosis, infection, petechiae, [name], [name] ([id number]) ischemia, or inflammatory conditions.Marland Kitchen Psychiatric Judgement and insight Intact.. No evidence of depression, anxiety, or agitation.. General Notes: she has a healing lacerated wound on the left lower extremity in the region of the mid shin with subcutaneous tissue and  debris which was sharply debrided with a curette. No evidence of cellulitis. Integumentary (Hair, Skin) No suspicious lesions. No crepitus or fluctuance. No peri-wound warmth or erythema. No masses.. Wound #1 status is Open. Original cause of wound was Trauma. The wound is located on the Left,Anterior Lower Leg. The wound measures 2.6cm length x 4.2cm width x 0.2cm depth; 8.577cm^2 area and 1.715cm^3 volume. The wound is limited to skin breakdown. There is no tunneling or undermining noted. There is a medium amount of serous drainage noted. The wound margin is flat and intact. There is medium (34-66%) red, pink granulation within the wound bed. There is a medium (34-66%) amount of necrotic tissue within the wound bed including Eschar and Adherent Slough. The periwound skin appearance exhibited: Localized Edema, Maceration, Moist, Erythema. The periwound skin appearance did not exhibit: Callus, Crepitus, Excoriation, Fluctuance, Friable, Induration, Rash, Scarring, Dry/Scaly, Atrophie Blanche, Cyanosis, Ecchymosis, Hemosiderin Staining, Mottled, Pallor, Rubor. The surrounding wound skin color is noted with erythema which is circumferential. Periwound temperature was noted as No Abnormality. The periwound has tenderness on palpation. Assessment Active Problems ICD-10 E11.622 - Type 2 diabetes mellitus with other skin ulcer S81.812A - Laceration without foreign body, left lower leg, initial encounter L97.222 - Non-pressure chronic ulcer of left calf with fat layer exposed I87.312 - Chronic venous hypertension (idiopathic) with ulcer of left lower extremity This is a 82 year old patient  known to be having controlled diabetes mellitus who had a laceration injury to the left lower extremity which has healed poorly and had an element of cellulitis which has been treated appropriately with doxycycline. after debriding the wound I have recommended medical need to be applied with an occlusive dressing  daily and we have discussed wound care and of also discussed wearing some compression stockings of the 20- 30 mm variety and have discussed details of this. She will come back to see me on a regular basis. Maertens, Lajoyce R. ([id number]) Procedures Wound #1 Wound #1 is a Trauma, Other located on the Left,Anterior Lower Leg . There was a Skin/Subcutaneous Tissue Debridement [id number]) debridement with total area of 10.92 sq cm performed by Christin Fudge, MD. with the following instrument(s): Curette to remove Viable and Non-Viable tissue/material including Fibrin/Slough and Subcutaneous after achieving pain control using Lidocaine 4% Topical Solution. A time out was conducted prior to the start of the procedure. A Minimum amount of bleeding was controlled with Pressure. The procedure was tolerated well with a pain level of 0 throughout and a pain level of 0 following the procedure. Post Debridement Measurements: 2.6cm length x 4.2cm width x 0.2cm depth; 1.715cm^3 volume. Post procedure Diagnosis Wound #1: Same as Pre-Procedure Plan Wound Cleansing: Wound #1 Left,Anterior Lower Leg: Clean wound with Normal Saline. May Shower, gently pat wound dry prior to applying new dressing. Skin Barriers/Peri-Wound Care: Wound #1 Left,Anterior Lower Leg: Skin Prep Primary Wound Dressing: Wound #1 Left,Anterior Lower Leg: Medihoney gel Secondary Dressing: Wound #1 Left,Anterior Lower Leg: Boardered Foam Dressing Non-adherent pad Dressing Change Frequency: Wound #1 Left,Anterior Lower Leg: Change dressing every day. Follow-up Appointments: Wound #1 Left,Anterior Lower Leg: Return Appointment in 1 week. Edema Control: Wound #1 Left,Anterior Lower Leg: Elevate legs to the level of the heart and pump ankles as often as possible Flannigan, Hazleigh R. ([id number]) This is a 82 year old patient known to be having controlled diabetes mellitus who had a laceration injury to the left lower extremity  which has healed poorly and had an element of cellulitis which has been treated appropriately with doxycycline. after debriding the wound I have recommended medical need to be applied with an occlusive dressing daily and we have discussed wound care and of also discussed wearing some compression stockings of the 20- 30 mm variety and have discussed details of this. She will come back to see me on a regular basis. Electronic Signature(s) Signed: 03/20/2015 2:10:52 PM By: Christin Fudge MD, FACS Entered By: Christin Fudge on 03/20/2015 14:10:52 DNASIA, GRITZMACHER ([id number]) -------------------------------------------------------------------------------- ROS/PFSH Details Patient Name: [name] 03/20/2015 12:45 Date of Service: PM Medical Record [id number] Number: Patient Account Number: [id number] [birth year] (82 y.o. Treating RN: Montey Hora Date of Birth/Sex: Female) Other Clinician: Primary Care Physician: Margarita Rana Treating Christin Fudge Referring Physician: Margarita Rana Physician/Extender: Suella Grove in Treatment: 0 Information Obtained From Patient Wound History Do you currently have one or more open woundso Yes How many open wounds do you currently haveo 1 Approximately how long have you had your woundso 3 weeks How have you been treating your wound(s) until nowo cleaning with saline, vaseline gazue and wrapping Has your wound(s) ever healed and then re-openedo No Have you had any lab work done in the past montho No Have you tested positive for an antibiotic resistant organism No (MRSA, VRE)o Have you tested positive for osteomyelitis (bone infection)o No Have you had any tests for circulation on your legso No Eyes Complaints and Symptoms: Positive for:  Glasses / Contacts - glasses Cardiovascular Complaints and Symptoms: Positive for: LE edema Medical History: Positive for: Hypertension Constitutional Symptoms (General Health) Complaints and Symptoms: No  Complaints or Symptoms Ear/Nose/Mouth/Throat Complaints and Symptoms: No Complaints or Symptoms Hematologic/Lymphatic Gortney, Maie R. ([id number]) Complaints and Symptoms: No Complaints or Symptoms Respiratory Complaints and Symptoms: No Complaints or Symptoms Gastrointestinal Complaints and Symptoms: No Complaints or Symptoms Medical History: Positive for: Colitis Endocrine Complaints and Symptoms: No Complaints or Symptoms Medical History: Positive for: Type II Diabetes Time with diabetes: 3-4 years Treated with: Oral agents Blood sugar tested every day: No Genitourinary Complaints and Symptoms: No Complaints or Symptoms Immunological Complaints and Symptoms: No Complaints or Symptoms Integumentary (Skin) Complaints and Symptoms: No Complaints or Symptoms Medical History: Past Medical History Notes: senile purpura, acne erythematosa Musculoskeletal Complaints and Symptoms: No Complaints or Symptoms Vasbinder, Tomasa R. ([id number]) Medical History: Positive for: Osteoarthritis Neurologic Complaints and Symptoms: No Complaints or Symptoms Oncologic Complaints and Symptoms: No Complaints or Symptoms Medical History: Negative for: Received Chemotherapy; Received Radiation Psychiatric Complaints and Symptoms: No Complaints or Symptoms Family and Social History Never smoker; Marital Status - Widowed; Alcohol Use: Never; Drug Use: No History; Caffeine Use: Never; Financial Concerns: No; Food, Clothing or Shelter Needs: No; Support System Lacking: No; Transportation Concerns: No; Advanced Directives: No; Patient does not want information on Advanced Directives Physician Affirmation I have reviewed and agree with the above information. Electronic Signature(s) Signed: 03/20/2015 1:49:09 PM By: Christin Fudge MD, FACS Signed: 03/20/2015 5:36:03 PM By: Montey Hora Previous Signature: 03/20/2015 1:14:54 PM Version By: Christin Fudge MD, FACS Entered By: Christin Fudge  on 03/20/2015 13:49:09 MADDISYN, BURSTEIN ([id number]) -------------------------------------------------------------------------------- SuperBill Details Patient Name: [name] Date of Service: 03/20/2015 Medical Record Number: [id number] Patient Account Number: [id number] Date of Birth/Sex: 06/21/1931 (83 y.o. Female) Treating RN: Montey Hora Primary Care Physician: Margarita Rana Other Clinician: Referring Physician: Margarita Rana Treating Physician/Extender: Frann Rider in Treatment: 0 Diagnosis Coding ICD-10 Codes Code Description E11.622 Type 2 diabetes mellitus with other skin ulcer S81.812A Laceration without foreign body, left lower leg, initial encounter L97.222 Non-pressure chronic ulcer of left calf with fat layer exposed I87.312 Chronic venous hypertension (idiopathic) with ulcer of left lower extremity Facility Procedures CPT4 Code Description: [id number] - WOUND CARE VISIT-LEV 3 EST PT Modifier: Quantity: 1 CPT4 Code Description: [id number] - DEB SUBQ TISSUE 20 SQ CM/< ICD-10 Description Diagnosis E11.622 Type 2 diabetes mellitus with other skin ulcer S81.812A Laceration without foreign body, left lower leg, initi L97.222 Non-pressure chronic ulcer of  left calf with fat layer I87.312 Chronic venous hypertension (idiopathic) with ulcer of Modifier: al encounter exposed left lower ext Quantity: 1 remity Physician Procedures CPT4 Code Description: [id number] - WC PHYS LEVEL 4 - NEW PT ICD-10 Description Diagnosis E11.622 Type 2 diabetes mellitus with other skin ulcer S81.812A Laceration without foreign body, left lower leg, initi L97.222 Non-pressure chronic ulcer of left  calf with fat layer I87.312 Chronic venous hypertension (idiopathic) with ulcer of Modifier: al encounter exposed left lower ext Quantity: 1 remity CPT4 Code Description: [id number] - WC PHYS SUBQ TISS 20 SQ CM ICD-10 Description Diagnosis E11.622 Type 2 diabetes  mellitus with other skin ulcer S81.812A Laceration without foreign body, left lower leg, initi Gupton, Matalyn R. ([id number]) Modifier: al encounter Quantity: 1 Electronic Signature(s) Signed: 03/20/2015 2:11:34 PM By: Christin Fudge MD, FACS Previous Signature: 03/20/2015 2:11:12 PM Version By: Christin Fudge MD, FACS Entered By: Christin Fudge on 03/20/2015 14:11:34

## 2015-03-23 ENCOUNTER — Other Ambulatory Visit: Payer: Self-pay | Admitting: Family Medicine

## 2015-03-23 DIAGNOSIS — I10 Essential (primary) hypertension: Secondary | ICD-10-CM

## 2015-03-25 ENCOUNTER — Other Ambulatory Visit: Payer: Self-pay | Admitting: Family Medicine

## 2015-03-25 DIAGNOSIS — I10 Essential (primary) hypertension: Secondary | ICD-10-CM

## 2015-03-27 ENCOUNTER — Encounter: Payer: Medicare Other | Admitting: Surgery

## 2015-03-27 DIAGNOSIS — E11621 Type 2 diabetes mellitus with foot ulcer: Secondary | ICD-10-CM | POA: Diagnosis not present

## 2015-03-27 DIAGNOSIS — L97222 Non-pressure chronic ulcer of left calf with fat layer exposed: Secondary | ICD-10-CM | POA: Diagnosis not present

## 2015-03-27 DIAGNOSIS — I10 Essential (primary) hypertension: Secondary | ICD-10-CM | POA: Diagnosis not present

## 2015-03-27 DIAGNOSIS — Z880 Allergy status to penicillin: Secondary | ICD-10-CM | POA: Diagnosis not present

## 2015-03-27 DIAGNOSIS — I87312 Chronic venous hypertension (idiopathic) with ulcer of left lower extremity: Secondary | ICD-10-CM | POA: Diagnosis not present

## 2015-03-27 DIAGNOSIS — S81812A Laceration without foreign body, left lower leg, initial encounter: Secondary | ICD-10-CM | POA: Diagnosis not present

## 2015-03-27 DIAGNOSIS — Z87891 Personal history of nicotine dependence: Secondary | ICD-10-CM | POA: Diagnosis not present

## 2015-03-27 DIAGNOSIS — E11622 Type 2 diabetes mellitus with other skin ulcer: Secondary | ICD-10-CM | POA: Diagnosis not present

## 2015-03-27 DIAGNOSIS — M1990 Unspecified osteoarthritis, unspecified site: Secondary | ICD-10-CM | POA: Diagnosis not present

## 2015-03-28 NOTE — Progress Notes (Signed)
DAMARI, MANNOR ([id number]) Visit Report for 03/27/2015 Chief Complaint Document Details Patient Name: [name], [name] 03/27/2015 10:00 Date of Service: AM Medical Record [id number] Number: Patient Account Number: [id number] [birth year] (82 y.o. Treating RN: Montey Hora Date of Birth/Sex: Female) Other Clinician: Primary Care Physician: Margarita Rana Treating Christin Fudge Referring Physician: Margarita Rana Physician/Extender: Suella Grove in Treatment: 1 Information Obtained from: Patient Chief Complaint Patient presents to the wound care center for a consult due non healing wound to the left lower extremity which she's had for about 3 weeks now Electronic Signature(s) Signed: 03/27/2015 10:20:20 AM By: Christin Fudge MD, FACS Entered By: Christin Fudge on 03/27/2015 10:20:20 [name] Bishop ([id number]) -------------------------------------------------------------------------------- Debridement Details Patient Name: [name] Austria R. 03/27/2015 10:00 Date of Service: AM Medical Record [id number] Number: Patient Account Number: [id number] 02/17/1932 (82 y.o. Treating RN: Montey Hora Date of Birth/Sex: Female) Other Clinician: Primary Care Physician: Margarita Rana Treating Christin Fudge Referring Physician: Margarita Rana Physician/Extender: Suella Grove in Treatment: 1 Debridement Performed for Wound #1 Left,Anterior Lower Leg Assessment: Performed By: Physician Christin Fudge, MD Debridement: Debridement Pre-procedure Yes Verification/Time Out Taken: Start Time: 10:09 Pain Control: Lidocaine 4% Topical Solution Level: Skin/Subcutaneous Tissue Total Area Debrided (L x 2 (cm) x 4 (cm) = 8 (cm) W): Tissue and other Viable, Non-Viable, Eschar, Fibrin/Slough, Subcutaneous material debrided: Instrument: Curette Bleeding: Minimum Hemostasis Achieved: Pressure End Time: 10:11 Procedural Pain: 0 Post Procedural Pain: 0 Response to Treatment: Procedure was tolerated  well Post Debridement Measurements of Total Wound Length: (cm) 2 Width: (cm) 4 Depth: (cm) 0.3 Volume: (cm) 1.885 Post Procedure Diagnosis Same as Pre-procedure Electronic Signature(s) Signed: 03/27/2015 10:20:14 AM By: Christin Fudge MD, FACS Signed: 03/27/2015 5:04:45 PM By: Montey Hora Previous Signature: 03/27/2015 10:20:03 AM Version By: Christin Fudge MD, FACS Entered By: Christin Fudge on 03/27/2015 10:20:14 [name], [name] ([id number]) [name], [name] ([id number]) -------------------------------------------------------------------------------- HPI Details Patient Name: [name] Austria R. 03/27/2015 10:00 Date of Service: AM Medical Record [id number] Number: Patient Account Number: [id number] [birth year] (82 y.o. Treating RN: Montey Hora Date of Birth/Sex: Female) Other Clinician: Primary Care Physician: Margarita Rana Treating Christin Fudge Referring Physician: Margarita Rana Physician/Extender: Suella Grove in Treatment: 1 History of Present Illness Location: lacerated wound to the left lower extremity Quality: Patient reports experiencing a dull pain to affected area(s). Severity: Patient states wound (s) are getting better. Duration: Patient has had the wound for < 3 weeks prior to presenting for treatment Timing: Pain in wound is Intermittent (comes and goes Context: The wound occurred when the patient had a blunt injury which caused a laceration to the left lower extremity Modifying Factors: Other treatment(s) tried include:had sutures placed in the ER and then had an infected wound which was treated with doxycycline Associated Signs and Symptoms: Patient reports having increase swelling. HPI Description: 82 year old patient who had an injury to her left lower extremity on December 21 and went to the ED and had sutures and Steri-Strips applied and tetanus shot given but no antibiotics. She was then seen by her PCP on 03/13/2015 and at that time was noted to have  atelectasis of the left lower extremity and hence was put on doxycycline 100 mg twice a day for 7 days. The sutures were removed and the wound was found to be gaping. past medical is trace significant for diabetes mellitus, arthritis, basal cell carcinoma, collagenous colitis, essential hypertension, avitaminosis D. she is also status post abdominal hysterectomy, lumbar discectomy, neck surgery. She is a former smoker and quit in 1979. of note  in the recent past she was seen by Dr. Lucky Cowboy for what seems a sounds like injections of varicose veins. this was only around her ankles and not in the lower extremities. No Doppler studies were done. She was not advised to wear any compression stockings. Electronic Signature(s) Signed: 03/27/2015 10:20:35 AM By: Christin Fudge MD, FACS Entered By: Christin Fudge on 03/27/2015 10:20:34 [name], [name] ([id number]) -------------------------------------------------------------------------------- Physical Exam Details Patient Name: [name], [name] 03/27/2015 10:00 Date of Service: AM Medical Record [id number] Number: Patient Account Number: [id number] October 11, 1931 (82 y.o. Treating RN: Montey Hora Date of Birth/Sex: Female) Other Clinician: Primary Care Physician: Margarita Rana Treating Christin Fudge Referring Physician: Margarita Rana Physician/Extender: Weeks in Treatment: 1 Constitutional . Pulse regular. Respirations normal and unlabored. Afebrile. . Eyes Nonicteric. Reactive to light. Ears, Nose, Mouth, and Throat Lips, teeth, and gums WNL.Marland Kitchen Moist mucosa without lesions. Neck supple and nontender. No palpable supraclavicular or cervical adenopathy. Normal sized without goiter. Respiratory WNL. No retractions.. Cardiovascular Pedal Pulses WNL. No clubbing, cyanosis or edema. Lymphatic No adneopathy. No adenopathy. No adenopathy. Musculoskeletal Adexa without tenderness or enlargement.. Digits and nails w/o clubbing, cyanosis,  infection, petechiae, ischemia, or inflammatory conditions.. Integumentary (Hair, Skin) No suspicious lesions. No crepitus or fluctuance. No peri-wound warmth or erythema. No masses.Marland Kitchen Psychiatric Judgement and insight Intact.. No evidence of depression, anxiety, or agitation.. Notes the wound has need for sharp debridement with a curet down to the subcutaneous tissue and a lot of the debris was removed down to healthy granulation tissue. Electronic Signature(s) Signed: 03/27/2015 10:21:10 AM By: Christin Fudge MD, FACS Entered By: Christin Fudge on 03/27/2015 10:21:09 CATALENA, REMLINGER ([id number]) -------------------------------------------------------------------------------- Physician Orders Details Patient Name: [name], [name] 03/27/2015 10:00 Date of Service: AM Medical Record [id number] Number: Patient Account Number: [id number] [birth year] (82 y.o. Treating RN: Montey Hora Date of Birth/Sex: Female) Other Clinician: Primary Care Physician: Margarita Rana Treating Christin Fudge Referring Physician: Margarita Rana Physician/Extender: Suella Grove in Treatment: 1 Verbal / Phone Orders: Yes Clinician: Montey Hora Read Back and Verified: Yes Diagnosis Coding Wound Cleansing Wound #1 Left,Anterior Lower Leg o Clean wound with Normal Saline. o May Shower, gently pat wound dry prior to applying new dressing. Skin Barriers/Peri-Wound Care Wound #1 Left,Anterior Lower Leg o Skin Prep Primary Wound Dressing Wound #1 Left,Anterior Lower Leg o Medihoney gel Secondary Dressing Wound #1 Left,Anterior Lower Leg o Boardered Foam Dressing o Non-adherent pad Dressing Change Frequency Wound #1 Left,Anterior Lower Leg o Change dressing every day. Follow-up Appointments Wound #1 Left,Anterior Lower Leg o Return Appointment in 1 week. Edema Control Wound #1 Left,Anterior Lower Leg o Patient to wear own compression stockings o Elevate legs to the level of the  heart and pump ankles as often as possible Electronic Signature(s) [name], [name] ([id number]) Signed: 03/27/2015 3:35:32 PM By: Christin Fudge MD, FACS Signed: 03/27/2015 5:04:45 PM By: Montey Hora Entered By: Montey Hora on 03/27/2015 10:11:13 [name], [name] ([id number]) -------------------------------------------------------------------------------- Problem List Details Patient Name: [name] Austria R. 03/27/2015 10:00 Date of Service: AM Medical Record [id number] Number: Patient Account Number: [id number] [birth year] (82 y.o. Treating RN: Montey Hora Date of Birth/Sex: Female) Other Clinician: Primary Care Physician: Margarita Rana Treating Christin Fudge Referring Physician: Margarita Rana Physician/Extender: Suella Grove in Treatment: 1 Active Problems ICD-10 Encounter Code Description Active Date Diagnosis E11.622 Type 2 diabetes mellitus with other skin ulcer 03/20/2015 Yes S81.812A Laceration without foreign body, left lower leg, initial 03/20/2015 Yes encounter L97.222 Non-pressure chronic ulcer of left calf with fat layer 03/20/2015 Yes exposed  [id number] Chronic venous hypertension (idiopathic) with ulcer of left 03/20/2015 Yes lower extremity Inactive Problems Resolved Problems Electronic Signature(s) Signed: 03/27/2015 10:19:45 AM By: Christin Fudge MD, FACS Entered By: Christin Fudge on 03/27/2015 10:19:45 [name] Bishop ([id number]) -------------------------------------------------------------------------------- Progress Note Details Patient Name: [name] Austria R. 03/27/2015 10:00 Date of Service: AM Medical Record [id number] Number: Patient Account Number: [id number] 1931-06-30 (82 y.o. Treating RN: Montey Hora Date of Birth/Sex: Female) Other Clinician: Primary Care Physician: Margarita Rana Treating Christin Fudge Referring Physician: Margarita Rana Physician/Extender: Suella Grove in Treatment: 1 Subjective Chief Complaint Information obtained from  Patient Patient presents to the wound care center for a consult due non healing wound to the left lower extremity which she's had for about 3 weeks now History of Present Illness (HPI) The following HPI elements were documented for the patient's wound: Location: lacerated wound to the left lower extremity Quality: Patient reports experiencing a dull pain to affected area(s). Severity: Patient states wound (s) are getting better. Duration: Patient has had the wound for < 3 weeks prior to presenting for treatment Timing: Pain in wound is Intermittent (comes and goes Context: The wound occurred when the patient had a blunt injury which caused a laceration to the left lower extremity Modifying Factors: Other treatment(s) tried include:had sutures placed in the ER and then had an infected wound which was treated with doxycycline Associated Signs and Symptoms: Patient reports having increase swelling. 82 year old patient who had an injury to her left lower extremity on December 21 and went to the ED and had sutures and Steri-Strips applied and tetanus shot given but no antibiotics. She was then seen by her PCP on 03/13/2015 and at that time was noted to have atelectasis of the left lower extremity and hence was put on doxycycline 100 mg twice a day for 7 days. The sutures were removed and the wound was found to be gaping. past medical is trace significant for diabetes mellitus, arthritis, basal cell carcinoma, collagenous colitis, essential hypertension, avitaminosis D. she is also status post abdominal hysterectomy, lumbar discectomy, neck surgery. She is a former smoker and quit in 1979. of note in the recent past she was seen by Dr. Lucky Cowboy for what seems a sounds like injections of varicose veins. this was only around her ankles and not in the lower extremities. No Doppler studies were done. She was not advised to wear any compression stockings. Objective [name], [name]  ([id number]) Constitutional Pulse regular. Respirations normal and unlabored. Afebrile. Vitals Time Taken: 9:51 AM, Height: 61 in, Source: Stated, Weight: 175 lbs, Source: Stated, BMI: 33.1, Temperature: 98.1 F, Pulse: 65 bpm, Respiratory Rate: 18 breaths/min, Blood Pressure: 141/48 mmHg. Eyes Nonicteric. Reactive to light. Ears, Nose, Mouth, and Throat Lips, teeth, and gums WNL.Marland Kitchen Moist mucosa without lesions. Neck supple and nontender. No palpable supraclavicular or cervical adenopathy. Normal sized without goiter. Respiratory WNL. No retractions.. Cardiovascular Pedal Pulses WNL. No clubbing, cyanosis or edema. Lymphatic No adneopathy. No adenopathy. No adenopathy. Musculoskeletal Adexa without tenderness or enlargement.. Digits and nails w/o clubbing, cyanosis, infection, petechiae, ischemia, or inflammatory conditions.Marland Kitchen Psychiatric Judgement and insight Intact.. No evidence of depression, anxiety, or agitation.. General Notes: the wound has need for sharp debridement with a curet down to the subcutaneous tissue and a lot of the debris was removed down to healthy granulation tissue. Integumentary (Hair, Skin) No suspicious lesions. No crepitus or fluctuance. No peri-wound warmth or erythema. No masses.. Wound #1 status is Open. Original cause of wound was Trauma. The wound is located  on the Left,Anterior Lower Leg. The wound measures 2cm length x 4cm width x 0.2cm depth; 6.283cm^2 area and 1.257cm^3 volume. The wound is limited to skin breakdown. There is no tunneling or undermining noted. There is a medium amount of serous drainage noted. The wound margin is flat and intact. There is medium (34-66%) red, pink granulation within the wound bed. There is a medium (34-66%) amount of necrotic tissue within the wound bed including Eschar and Adherent Slough. The periwound skin appearance exhibited: Localized Edema, Maceration, Moist, Erythema. The periwound skin appearance did not  exhibit: Callus, Crepitus, Excoriation, Fluctuance, Friable, Induration, Rash, Scarring, Dry/Scaly, Atrophie Blanche, Cyanosis, Ecchymosis, Hemosiderin Staining, Mottled, Pallor, Rubor. The surrounding wound skin color is noted with erythema which is circumferential. Periwound temperature was noted as No Abnormality. The periwound [name], [name] ([id number]) has tenderness on palpation. Assessment Active Problems ICD-10 E11.622 - Type 2 diabetes mellitus with other skin ulcer S81.812A - Laceration without foreign body, left lower leg, initial encounter L97.222 - Non-pressure chronic ulcer of left calf with fat layer exposed I87.312 - Chronic venous hypertension (idiopathic) with ulcer of left lower extremity Procedures Wound #1 Wound #1 is a Trauma, Other located on the Left,Anterior Lower Leg . There was a Skin/Subcutaneous Tissue Debridement [id number]) debridement with total area of 8 sq cm performed by Christin Fudge, MD. with the following instrument(s): Curette to remove Viable and Non-Viable tissue/material including Fibrin/Slough, Eschar, and Subcutaneous after achieving pain control using Lidocaine 4% Topical Solution. A time out was conducted prior to the start of the procedure. A Minimum amount of bleeding was controlled with Pressure. The procedure was tolerated well with a pain level of 0 throughout and a pain level of 0 following the procedure. Post Debridement Measurements: 2cm length x 4cm width x 0.3cm depth; 1.885cm^3 volume. Post procedure Diagnosis Wound #1: Same as Pre-Procedure Plan Wound Cleansing: Wound #1 Left,Anterior Lower Leg: Clean wound with Normal Saline. May Shower, gently pat wound dry prior to applying new dressing. Skin Barriers/Peri-Wound Care: Wound #1 Left,Anterior Lower Leg: Skin Prep Primary Wound Dressing: Wound #1 Left,Anterior Lower Leg: Tally, Tyrianna R. ([id number]) Medihoney gel Secondary Dressing: Wound #1 Left,Anterior Lower  Leg: Boardered Foam Dressing Non-adherent pad Dressing Change Frequency: Wound #1 Left,Anterior Lower Leg: Change dressing every day. Follow-up Appointments: Wound #1 Left,Anterior Lower Leg: Return Appointment in 1 week. Edema Control: Wound #1 Left,Anterior Lower Leg: Patient to wear own compression stockings Elevate legs to the level of the heart and pump ankles as often as possible After debriding the wound I have recommended Medihoney to be applied with an occlusive dressing daily and we have discussed wound care and of also discussed wearing some compression stockings of the 20- 30 mm variety and have discussed details of this. She will come back to see me on a regular basis. Electronic Signature(s) Signed: 03/27/2015 10:21:47 AM By: Christin Fudge MD, FACS Entered By: Christin Fudge on 03/27/2015 10:21:47 [name], [name] ([id number]) -------------------------------------------------------------------------------- SuperBill Details Patient Name: [name] Bishop Date of Service: 03/27/2015 Medical Record Number: [id number] Patient Account Number: [id number] Date of Birth/Sex: 11/02/1931 (83 y.o. Female) Treating RN: Montey Hora Primary Care Physician: Margarita Rana Other Clinician: Referring Physician: Margarita Rana Treating Physician/Extender: Frann Rider in Treatment: 1 Diagnosis Coding ICD-10 Codes Code Description [phone] Type 2 diabetes mellitus with other skin ulcer S81.812A Laceration without foreign body, left lower leg, initial encounter L97.222 Non-pressure chronic ulcer of left calf with fat layer exposed I87.312 Chronic venous hypertension (idiopathic) with ulcer of  left lower extremity Facility Procedures CPT4 Code Description: [id number] - DEB SUBQ TISSUE 20 SQ CM/< ICD-10 Description Diagnosis E11.622 Type 2 diabetes mellitus with other skin ulcer S81.812A Laceration without foreign body, left lower leg, initi L97.222 Non-pressure chronic  ulcer of  left calf with fat layer I87.312 Chronic venous hypertension (idiopathic) with ulcer of Modifier: al encounte exposed left lower Quantity: 1 r extremity Physician Procedures CPT4 Code Description: [id number] - WC PHYS SUBQ TISS 20 SQ CM ICD-10 Description Diagnosis E11.622 Type 2 diabetes mellitus with other skin ulcer S81.812A Laceration without foreign body, left lower leg, initi L97.222 Non-pressure chronic ulcer of  left calf with fat layer I87.312 Chronic venous hypertension (idiopathic) with ulcer of Modifier: al encounter exposed left lower Quantity: 1 extremity Electronic Signature(s) Signed: 03/27/2015 10:21:59 AM By: Christin Fudge MD, FACS Entered By: Christin Fudge on 03/27/2015 10:21:59

## 2015-03-28 NOTE — Progress Notes (Signed)
[name], [name] ([id number]) Visit Report for 03/27/2015 Arrival Information Details Patient Name: [name] Date of Service: 03/27/2015 10:00 AM Medical Record Number: [id number] Patient Account Number: [id number] Date of Birth/Sex: 03-19-1931 (82 y.o. Female) Treating RN: Montey Hora Primary Care Physician: Margarita Rana Other Clinician: Referring Physician: Margarita Rana Treating Physician/Extender: Frann Rider in Treatment: 1 Visit Information History Since Last Visit Added or deleted any medications: No Patient Arrived: Ambulatory Any new allergies or adverse reactions: No Arrival Time: 09:48 Had a fall or experienced change in No Accompanied By: self activities of daily living that may affect Transfer Assistance: None risk of falls: Patient Identification Verified: Yes Signs or symptoms of abuse/neglect since last No Secondary Verification Process Yes visito Completed: Hospitalized since last visit: No Patient Has Alerts: Yes Pain Present Now: No Patient Alerts: DMII Electronic Signature(s) Signed: 03/27/2015 5:04:45 PM By: Montey Hora Entered By: Montey Hora on 03/27/2015 09:49:12 [name], [name] ([id number]) -------------------------------------------------------------------------------- Encounter Discharge Information Details Patient Name: [name] Date of Service: 03/27/2015 10:00 AM Medical Record Number: [id number] Patient Account Number: [id number] Date of Birth/Sex: 1931/11/17 (82 y.o. Female) Treating RN: Montey Hora Primary Care Physician: Margarita Rana Other Clinician: Referring Physician: Margarita Rana Treating Physician/Extender: Frann Rider in Treatment: 1 Encounter Discharge Information Items Discharge Pain Level: 0 Discharge Condition: Stable Ambulatory Status: Ambulatory Discharge Destination: Home Transportation: Private Auto Accompanied By: self Schedule Follow-up Appointment:  Yes Medication Reconciliation completed and provided to Patient/Care No Esli Jernigan: Provided on Clinical Summary of Care: 03/27/2015 Form Type Recipient Paper Patient [place] Signature(s) Signed: 03/27/2015 10:21:47 AM By: Ruthine Dose Entered By: Ruthine Dose on 03/27/2015 10:21:46 [name] ([id number]) -------------------------------------------------------------------------------- Lower Extremity Assessment Details Patient Name: [name] Date of Service: 03/27/2015 10:00 AM Medical Record Number: [id number] Patient Account Number: [id number] Date of Birth/Sex: 08-Jan-1932 (82 y.o. Female) Treating RN: Montey Hora Primary Care Physician: Margarita Rana Other Clinician: Referring Physician: Margarita Rana Treating Physician/Extender: Frann Rider in Treatment: 1 Edema Assessment Assessed: [Left: No] [Right: No] Edema: [Left: Ye] [Right: s] Calf Left: Right: Point of Measurement: 30 cm From Medial Instep 38.7 cm cm Ankle Left: Right: Point of Measurement: 10 cm From Medial Instep 22.4 cm cm Vascular Assessment Pulses: Posterior Tibial Dorsalis Pedis Palpable: [Left:Yes] Extremity colors, hair growth, and conditions: Extremity Color: [Left:Hyperpigmented] Hair Growth on Extremity: [Left:No] Temperature of Extremity: [Left:Warm] Capillary Refill: [Left:< 3 seconds] Electronic Signature(s) Signed: 03/27/2015 5:04:45 PM By: Montey Hora Entered By: Montey Hora on 03/27/2015 09:59:12 Clary, [name] ([id number]) -------------------------------------------------------------------------------- Multi Wound Chart Details Patient Name: [name] Date of Service: 03/27/2015 10:00 AM Medical Record Number: [id number] Patient Account Number: [id number] Date of Birth/Sex: [birth year] (82 y.o. Female) Treating RN: Montey Hora Primary Care Physician: Margarita Rana Other Clinician: Referring Physician: Margarita Rana Treating  Physician/Extender: Frann Rider in Treatment: 1 Vital Signs Height(in): 61 Pulse(bpm): 65 Weight(lbs): 175 Blood Pressure 141/48 (mmHg): Body Mass Index(BMI): 33 Temperature(F): 98.1 Respiratory Rate 18 (breaths/min): Photos: [1:No Photos] [N/A:N/A] Wound Location: [1:Left Lower Leg - Anterior] [N/A:N/A] Wounding Event: [1:Trauma] [N/A:N/A] Primary Etiology: [1:Trauma, Other] [N/A:N/A] Comorbid History: [1:Hypertension, Colitis, Type II Diabetes, Osteoarthritis] [N/A:N/A] Date Acquired: [1:02/28/2015] [N/A:N/A] Weeks of Treatment: [1:1] [N/A:N/A] Wound Status: [1:Open] [N/A:N/A] Measurements L x W x D 2x4x0.2 [N/A:N/A] (cm) Area (cm) : [1:6.283] [N/A:N/A] Volume (cm) : [1:1.257] [N/A:N/A] % Reduction in Area: [1:26.70%] [N/A:N/A] % Reduction in Volume: 26.70% [N/A:N/A] Classification: [1:Full Thickness Without Exposed Support Structures] [N/A:N/A] HBO Classification: [1:Grade 1] [N/A:N/A] Exudate Amount: [1:Medium] [  N/A:N/A] Exudate Type: [1:Serous] [N/A:N/A] Exudate Color: [1:amber] [N/A:N/A] Wound Margin: [1:Flat and Intact] [N/A:N/A] Granulation Amount: [1:Medium (34-66%)] [N/A:N/A] Granulation Quality: [1:Red, Pink] [N/A:N/A] Necrotic Amount: [1:Medium (34-66%)] [N/A:N/A] Necrotic Tissue: [1:Eschar, Adherent Slough] [N/A:N/A] Exposed Structures: [1:Fascia: No Fat: No] [N/A:N/A] Tendon: No Muscle: No Joint: No Bone: No Limited to Skin Breakdown Epithelialization: None N/A N/A Periwound Skin Texture: Edema: Yes N/A N/A Excoriation: No Induration: No Callus: No Crepitus: No Fluctuance: No Friable: No Rash: No Scarring: No Periwound Skin Maceration: Yes N/A N/A Moisture: Moist: Yes Dry/Scaly: No Periwound Skin Color: Erythema: Yes N/A N/A Atrophie Blanche: No Cyanosis: No Ecchymosis: No Hemosiderin Staining: No Mottled: No Pallor: No Rubor: No Erythema Location: Circumferential N/A N/A Temperature: No Abnormality N/A N/A Tenderness on  Yes N/A N/A Palpation: Wound Preparation: Ulcer Cleansing: N/A N/A Rinsed/Irrigated with Saline Topical Anesthetic Applied: Other: lidocaine 4% Treatment Notes Electronic Signature(s) Signed: 03/27/2015 5:04:45 PM By: Montey Hora Entered By: Montey Hora on 03/27/2015 10:01:33 [name], [name] ([id number]) -------------------------------------------------------------------------------- [place] Details Patient Name: [name] Date of Service: 03/27/2015 10:00 AM Medical Record Number: [id number] Patient Account Number: [id number] Date of Birth/Sex: 1931-06-17 (82 y.o. Female) Treating RN: Montey Hora Primary Care Physician: Margarita Rana Other Clinician: Referring Physician: Margarita Rana Treating Physician/Extender: Frann Rider in Treatment: 1 Active Inactive Abuse / Safety / Falls / Self Care Management Nursing Diagnoses: Impaired physical mobility Potential for falls Goals: Patient will remain injury free Date Initiated: 03/20/2015 Goal Status: Active Interventions: Assess fall risk on admission and as needed Notes: Orientation to the Wound Care Program Nursing Diagnoses: Knowledge deficit related to the wound healing center program Goals: Patient/caregiver will verbalize understanding of the [place] Program Date Initiated: 03/20/2015 Goal Status: Active Interventions: Provide education on orientation to the wound center Notes: Wound/Skin Impairment Nursing Diagnoses: Impaired tissue integrity Goals: Patient/caregiver will verbalize understanding of skin care regimen [name], [name] ([id number]) Date Initiated: 03/20/2015 Goal Status: Active Ulcer/skin breakdown will have a volume reduction of 30% by week 4 Date Initiated: 03/20/2015 Goal Status: Active Ulcer/skin breakdown will have a volume reduction of 50% by week 8 Date Initiated: 03/20/2015 Goal Status: Active Ulcer/skin breakdown will have a  volume reduction of 80% by week 12 Date Initiated: 03/20/2015 Goal Status: Active Ulcer/skin breakdown will heal within 14 weeks Date Initiated: 03/20/2015 Goal Status: Active Interventions: Assess patient/caregiver ability to obtain necessary supplies Assess patient/caregiver ability to perform ulcer/skin care regimen upon admission and as needed Assess ulceration(s) every visit Notes: Electronic Signature(s) Signed: 03/27/2015 5:04:45 PM By: Montey Hora Entered By: Montey Hora on 03/27/2015 10:01:25 [name] ([id number]) -------------------------------------------------------------------------------- Patient/Caregiver Education Details Patient Name: [name] Date of Service: 03/27/2015 10:00 AM Medical Record Number: [id number] Patient Account Number: [id number] Date of Birth/Gender: 1931/11/03 (82 y.o. Female) Treating RN: Montey Hora Primary Care Physician: Margarita Rana Other Clinician: Referring Physician: Margarita Rana Treating Physician/Extender: Frann Rider in Treatment: 1 Education Assessment Education Provided To: Patient Education Topics Provided Venous: Handouts: Other: compression hose as ordered Methods: Explain/Verbal Responses: State content correctly Wound/Skin Impairment: Handouts: Other: wound carea s ordered Methods: Demonstration, Explain/Verbal Responses: State content correctly Electronic Signature(s) Signed: 03/27/2015 5:04:45 PM By: Montey Hora Entered By: Montey Hora on 03/27/2015 10:22:26 [name], [name] ([id number]) -------------------------------------------------------------------------------- Wound Assessment Details Patient Name: [name] Date of Service: 03/27/2015 10:00 AM Medical Record Number: [id number] Patient Account Number: [id number] Date of Birth/Sex: [birth year] (82 y.o. Female) Treating RN: Montey Hora Primary Care Physician: Margarita Rana Other Clinician:  Referring  Physician: Margarita Rana Treating Physician/Extender: Frann Rider in Treatment: 1 Wound Status Wound Number: 1 Primary Trauma, Other Etiology: Wound Location: Left Lower Leg - Anterior Wound Open Wounding Event: Trauma Status: Date Acquired: 02/28/2015 Comorbid Hypertension, Colitis, Type II Weeks Of Treatment: 1 History: Diabetes, Osteoarthritis Clustered Wound: No Photos Photo Uploaded By: Montey Hora on 03/27/2015 10:26:50 Wound Measurements Length: (cm) 2 Width: (cm) 4 Depth: (cm) 0.2 Area: (cm) 6.283 Volume: (cm) 1.257 % Reduction in Area: 26.7% % Reduction in Volume: 26.7% Epithelialization: None Tunneling: No Undermining: No Wound Description Full Thickness Without Classification: Exposed Support Structures Diabetic Severity Grade 1 (Wagner): Wound Margin: Flat and Intact Exudate Amount: Medium Exudate Type: Serous Exudate Color: amber Foul Odor After Cleansing: No Wound Bed Granulation Amount: Medium (34-66%) Exposed Structure Granulation Quality: Red, Pink Fascia Exposed: No [name], [name] ([id number]) Necrotic Amount: Medium (34-66%) Fat Layer Exposed: No Necrotic Quality: Eschar, Adherent Slough Tendon Exposed: No Muscle Exposed: No Joint Exposed: No Bone Exposed: No Limited to Skin Breakdown Periwound Skin Texture Texture Color No Abnormalities Noted: No No Abnormalities Noted: No Callus: No Atrophie Blanche: No Crepitus: No Cyanosis: No Excoriation: No Ecchymosis: No Fluctuance: No Erythema: Yes Friable: No Erythema Location: Circumferential Induration: No Hemosiderin Staining: No Localized Edema: Yes Mottled: No Rash: No Pallor: No Scarring: No Rubor: No Moisture Temperature / Pain No Abnormalities Noted: No Temperature: No Abnormality Dry / Scaly: No Tenderness on Palpation: Yes Maceration: Yes Moist: Yes Wound Preparation Ulcer Cleansing: Rinsed/Irrigated with Saline Topical Anesthetic  Applied: Other: lidocaine 4%, Treatment Notes Wound #1 (Left, Anterior Lower Leg) 1. Cleansed with: Clean wound with Normal Saline 2. Anesthetic Topical Lidocaine 4% cream to wound bed prior to debridement 4. Dressing Applied: Medihoney Alginate 5. Secondary Dressing Applied Bordered Foam Dressing Non-Adherent pad Electronic Signature(s) Signed: 03/27/2015 5:04:45 PM By: Montey Hora Entered By: Montey Hora on 03/27/2015 10:00:09 [name], [name] ([id number]) -------------------------------------------------------------------------------- Vitals Details Patient Name: [name] Date of Service: 03/27/2015 10:00 AM Medical Record Number: [id number] Patient Account Number: [id number] Date of Birth/Sex: 1931/06/21 (82 y.o. Female) Treating RN: Montey Hora Primary Care Physician: Margarita Rana Other Clinician: Referring Physician: Margarita Rana Treating Physician/Extender: Frann Rider in Treatment: 1 Vital Signs Time Taken: 09:51 Temperature (F): 98.1 Height (in): 61 Pulse (bpm): 65 Source: Stated Respiratory Rate (breaths/min): 18 Weight (lbs): 175 Blood Pressure (mmHg): 141/48 Source: Stated Reference Range: 80 - 120 mg / dl Body Mass Index (BMI): 33.1 Electronic Signature(s) Signed: 03/27/2015 5:04:45 PM By: Montey Hora Entered By: Montey Hora on 03/27/2015 09:55:33

## 2015-04-03 ENCOUNTER — Encounter: Payer: Medicare Other | Admitting: Surgery

## 2015-04-03 DIAGNOSIS — S81812A Laceration without foreign body, left lower leg, initial encounter: Secondary | ICD-10-CM | POA: Diagnosis not present

## 2015-04-03 DIAGNOSIS — I87312 Chronic venous hypertension (idiopathic) with ulcer of left lower extremity: Secondary | ICD-10-CM | POA: Diagnosis not present

## 2015-04-03 DIAGNOSIS — M1990 Unspecified osteoarthritis, unspecified site: Secondary | ICD-10-CM | POA: Diagnosis not present

## 2015-04-03 DIAGNOSIS — Z87891 Personal history of nicotine dependence: Secondary | ICD-10-CM | POA: Diagnosis not present

## 2015-04-03 DIAGNOSIS — E11621 Type 2 diabetes mellitus with foot ulcer: Secondary | ICD-10-CM | POA: Diagnosis not present

## 2015-04-03 DIAGNOSIS — I10 Essential (primary) hypertension: Secondary | ICD-10-CM | POA: Diagnosis not present

## 2015-04-03 DIAGNOSIS — Z880 Allergy status to penicillin: Secondary | ICD-10-CM | POA: Diagnosis not present

## 2015-04-03 DIAGNOSIS — E11622 Type 2 diabetes mellitus with other skin ulcer: Secondary | ICD-10-CM | POA: Diagnosis not present

## 2015-04-03 DIAGNOSIS — L97222 Non-pressure chronic ulcer of left calf with fat layer exposed: Secondary | ICD-10-CM | POA: Diagnosis not present

## 2015-04-03 NOTE — Progress Notes (Signed)
RACQUELLE, [name] ([id number]) Visit Report for 04/03/2015 Chief Complaint Document Details Patient Name: [name], [name] 04/03/2015 10:45 Date of Service: AM Medical Record [id number] Number: Patient Account Number: [id number] April 04, 1931 (82 y.o. Treating RN: Baruch Gouty, RN, BSN, Velva Harman Date of Birth/Sex: Female) Other Clinician: Primary Care Physician: Margarita Rana Treating Christin Fudge Referring Physician: Margarita Rana Physician/Extender: Suella Grove in Treatment: 2 Information Obtained from: Patient Chief Complaint Patient presents to the wound care center for a consult due non healing wound to the left lower extremity which she's had for about 3 weeks now Electronic Signature(s) Signed: 04/03/2015 11:05:56 AM By: Christin Fudge MD, FACS Entered By: Christin Fudge on 04/03/2015 11:05:56 [name] ([id number]) -------------------------------------------------------------------------------- Debridement Details Patient Name: [name]. 04/03/2015 10:45 Date of Service: AM Medical Record [id number] Number: Patient Account Number: [id number] [birth year] (82 y.o. Treating RN: Afful, RN, BSN, Velva Harman Date of Birth/Sex: Female) Other Clinician: Primary Care Physician: Margarita Rana Treating Christin Fudge Referring Physician: Margarita Rana Physician/Extender: Weeks in Treatment: 2 Debridement Performed for Wound #1 Left,Anterior Lower Leg Assessment: Performed By: Physician Christin Fudge, MD Debridement: Debridement Pre-procedure Yes Verification/Time Out Taken: Start Time: 10:58 Pain Control: Lidocaine 4% Topical Solution Level: Skin/Subcutaneous Tissue Total Area Debrided (L x 2 (cm) x 2.4 (cm) = 4.8 (cm) W): Tissue and other Viable, Non-Viable, Exudate, Fibrin/Slough, Subcutaneous material debrided: Instrument: Curette Bleeding: Minimum Hemostasis Achieved: Pressure End Time: 11:03 Procedural Pain: 0 Post Procedural Pain: 0 Response to Treatment: Procedure  was tolerated well Post Debridement Measurements of Total Wound Length: (cm) 2 Width: (cm) 2.4 Depth: (cm) 0.2 Volume: (cm) 0.754 Post Procedure Diagnosis Same as Pre-procedure Electronic Signature(s) Signed: 04/03/2015 11:05:50 AM By: Christin Fudge MD, FACS Signed: 04/03/2015 1:00:56 PM By: Regan Lemming BSN, RN Previous Signature: 04/03/2015 11:05:36 AM Version By: Christin Fudge MD, FACS Entered By: Christin Fudge on 04/03/2015 11:05:49 DESERE, TUSZYNSKI ([id number]) AMYREE, ROESEL ([id number]) -------------------------------------------------------------------------------- HPI Details Patient Name: [name] 04/03/2015 10:45 Date of Service: AM Medical Record [id number] Number: Patient Account Number: [id number] 07-08-1931 (82 y.o. Treating RN: Baruch Gouty, RN, BSN, Velva Harman Date of Birth/Sex: Female) Other Clinician: Primary Care Physician: Margarita Rana Treating Christin Fudge Referring Physician: Margarita Rana Physician/Extender: Weeks in Treatment: 2 History of Present Illness Location: lacerated wound to the left lower extremity Quality: Patient reports experiencing a dull pain to affected area(s). Severity: Patient states wound (s) are getting better. Duration: Patient has had the wound for < 3 weeks prior to presenting for treatment Timing: Pain in wound is Intermittent (comes and goes Context: The wound occurred when the patient had a blunt injury which caused a laceration to the left lower extremity Modifying Factors: Other treatment(s) tried include:had sutures placed in the ER and then had an infected wound which was treated with doxycycline Associated Signs and Symptoms: Patient reports having increase swelling. HPI Description: 82 year old patient who had an injury to her left lower extremity on December 21 and went to the ED and had sutures and Steri-Strips applied and tetanus shot given but no antibiotics. She was then seen by her PCP on 03/13/2015 and at  that time was noted to have atelectasis of the left lower extremity and hence was put on doxycycline 100 mg twice a day for 7 days. The sutures were removed and the wound was found to be gaping. past medical is trace significant for diabetes mellitus, arthritis, basal cell carcinoma, collagenous colitis, essential hypertension, avitaminosis D. she is also status post abdominal hysterectomy, lumbar discectomy, neck surgery. She is a  former smoker and quit in 1979. of note in the recent past she was seen by Dr. Lucky Cowboy for what seems a sounds like injections of varicose veins. this was only around her ankles and not in the lower extremities. No Doppler studies were done. She was not advised to wear any compression stockings. 04/03/2015 -- she has received the compression stockings but is unable to wear them as she does not have the ability to don them. Electronic Signature(s) Signed: 04/03/2015 11:06:20 AM By: Christin Fudge MD, FACS Entered By: Christin Fudge on 04/03/2015 11:06:20 LIESELOTTE, HESSELTINE ([id number]) -------------------------------------------------------------------------------- Physical Exam Details Patient Name: [name] 04/03/2015 10:45 Date of Service: AM Medical Record [id number] Number: Patient Account Number: [id number] [birth year] (82 y.o. Treating RN: Baruch Gouty, RN, BSN, Velva Harman Date of Birth/Sex: Female) Other Clinician: Primary Care Physician: Margarita Rana Treating Christin Fudge Referring Physician: Margarita Rana Physician/Extender: Weeks in Treatment: 2 Constitutional . Pulse regular. Respirations normal and unlabored. Afebrile. . Eyes Nonicteric. Reactive to light. Ears, Nose, Mouth, and Throat Lips, teeth, and gums WNL.Marland Kitchen Moist mucosa without lesions. Neck supple and nontender. No palpable supraclavicular or cervical adenopathy. Normal sized without goiter. Respiratory WNL. No retractions.. Cardiovascular Pedal Pulses WNL. No clubbing, cyanosis or  edema. Lymphatic No adneopathy. No adenopathy. No adenopathy. Musculoskeletal Adexa without tenderness or enlargement.. Digits and nails w/o clubbing, cyanosis, infection, petechiae, ischemia, or inflammatory conditions.. Integumentary (Hair, Skin) No suspicious lesions. No crepitus or fluctuance. No peri-wound warmth or erythema. No masses.Marland Kitchen Psychiatric Judgement and insight Intact.. No evidence of depression, anxiety, or agitation.. Notes she again has quite a bit of subcutaneous debris and this has been sharply debrided with a curet down to the subcutis tissue and she has minimal amount of healthy granulation tissue at the base. Bleeding was stopped with pressure. Electronic Signature(s) Signed: 04/03/2015 11:06:51 AM By: Christin Fudge MD, FACS Entered By: Christin Fudge on 04/03/2015 11:06:50 CRYSTEL, BODKINS ([id number]) -------------------------------------------------------------------------------- Physician Orders Details Patient Name: MAZELLA, MUTHIG 04/03/2015 10:45 Date of Service: AM Medical Record [id number] Number: Patient Account Number: [id number] 1931/07/10 (82 y.o. Treating RN: Baruch Gouty, RN, BSN, Velva Harman Date of Birth/Sex: Female) Other Clinician: Primary Care Physician: Margarita Rana Treating Christin Fudge Referring Physician: Margarita Rana Physician/Extender: Suella Grove in Treatment: 2 Verbal / Phone Orders: Yes Clinician: Afful, RN, BSN, Rita Read Back and Verified: Yes Diagnosis Coding Wound Cleansing Wound #1 Left,Anterior Lower Leg o Clean wound with Normal Saline. o May Shower, gently pat wound dry prior to applying new dressing. Anesthetic Wound #1 Left,Anterior Lower Leg o Topical Lidocaine 4% cream applied to wound bed prior to debridement Primary Wound Dressing Wound #1 Left,Anterior Lower Leg o Medihoney gel Secondary Dressing Wound #1 Left,Anterior Lower Leg o Boardered Foam Dressing o Non-adherent pad Dressing Change  Frequency Wound #1 Left,Anterior Lower Leg o Change dressing every day. Follow-up Appointments Wound #1 Left,Anterior Lower Leg o Return Appointment in 1 week. Edema Control Wound #1 Left,Anterior Lower Leg o Patient to wear own compression stockings o Elevate legs to the level of the heart and pump ankles as often as possible Electronic Signature(s) PATIANCE, SOBCZYK ([id number]) Signed: 04/03/2015 1:00:56 PM By: Regan Lemming BSN, RN Signed: 04/03/2015 2:32:13 PM By: Christin Fudge MD, FACS Entered By: Regan Lemming on 04/03/2015 11:00:13 RAJANAE, CAUSEVIC ([id number]) -------------------------------------------------------------------------------- Problem List Details Patient Name: SHYLER, CRABBS 04/03/2015 10:45 Date of Service: AM Medical Record [id number] Number: Patient Account Number: [id number] Dec 28, 1931 (82 y.o. 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 Treating RN: Baruch Gouty, RN, BSN, Velva Harman Date of Birth/Sex: Female) Other Clinician: Primary Care Physician: Margarita Rana Treating Christin Fudge Referring Physician: Margarita Rana Physician/Extender: Weeks in Treatment: 2 Subjective Chief Complaint Information obtained from Patient Patient presents to the wound care center for a consult due non healing wound to the left lower extremity which she's had for about 3 weeks now History of Present Illness (HPI) The following HPI elements were documented for the patient's wound: Location: lacerated wound to the left lower extremity Quality: Patient reports experiencing a dull pain to affected area(s). Severity: Patient states wound (s) are getting better. Duration: Patient has had the wound for < 3 weeks prior to presenting for treatment Timing: Pain in wound is Intermittent (comes and goes Context: The wound occurred when the patient had a blunt injury which caused a laceration to the left lower extremity Modifying Factors: Other treatment(s) tried include:had sutures placed in the ER and then had an infected wound which was treated with doxycycline Associated Signs and Symptoms: Patient reports having increase swelling. 82 year old patient who had an injury to her left lower extremity on December 21 and went to the ED and had sutures and Steri-Strips applied and tetanus shot given but no antibiotics. She was then seen by her PCP on 03/13/2015 and at that time was noted to have atelectasis of the left lower extremity and hence was put on doxycycline 100 mg twice a day for 7 days. The sutures were removed and the wound was found to be gaping. past medical is trace significant for diabetes mellitus, arthritis, basal cell carcinoma, collagenous colitis, essential hypertension, avitaminosis D. she is also status post abdominal hysterectomy, lumbar discectomy, neck surgery. She is a former smoker and quit in  1979. of note in the recent past she was seen by Dr. Lucky Cowboy for what seems a sounds like injections of varicose veins. this was only around her ankles and not in the lower extremities. No Doppler studies were done. She was not advised to wear any compression stockings. 04/03/2015 -- she has received the compression stockings but is unable to wear them as she does not have the ability to don them. Meinecke, Javonda R. ([id number]) Objective Constitutional Pulse regular. Respirations normal and unlabored. Afebrile. Vitals Time Taken: 10:44 AM, Height: 61 in, Weight: 175 lbs, BMI: 33.1, Temperature: 97.6 F, Pulse: 63 bpm, Respiratory Rate: 18 breaths/min, Blood Pressure: 142/63 mmHg. Eyes Nonicteric. Reactive to light. Ears, Nose, Mouth, and Throat Lips, teeth, and gums WNL.Marland Kitchen Moist mucosa without lesions. Neck supple and nontender. No palpable supraclavicular or cervical adenopathy. Normal sized without goiter. Respiratory WNL. No retractions.. Cardiovascular Pedal Pulses WNL. No clubbing, cyanosis or edema. Lymphatic No adneopathy. No adenopathy. No adenopathy. Musculoskeletal Adexa without tenderness or enlargement.. Digits and nails  w/o clubbing, cyanosis, infection, petechiae, ischemia, or inflammatory conditions.Marland Kitchen Psychiatric Judgement and insight Intact.. No evidence of depression, anxiety, or agitation.. General Notes: she again has quite a bit of subcutaneous debris and this has been sharply debrided with a curet down to the subcutis tissue and she has minimal amount of healthy granulation tissue at the base. Bleeding was stopped with pressure. Integumentary (Hair, Skin) No suspicious lesions. No crepitus or fluctuance. No peri-wound warmth or erythema. No masses.. Wound #1 status is Open. Original cause of wound was Trauma. The wound is located on the Left,Anterior Lower Leg. The wound measures 2cm length x 2.4cm width x 0.2cm depth; 3.77cm^2 area and 0.754cm^3 volume. The wound  is limited to skin breakdown. There is no tunneling or undermining noted. There is a medium amount of serosanguineous drainage noted. The wound margin is flat and intact. There is small (1-33%) red, pink granulation within the wound bed. There is a medium (34-66%) amount of necrotic tissue Bostic, Hadas R. ([id number]) within the wound bed including Adherent Slough. The periwound skin appearance exhibited: Localized Edema, Moist, Erythema. The periwound skin appearance did not exhibit: Callus, Crepitus, Excoriation, Fluctuance, Friable, Induration, Rash, Scarring, Dry/Scaly, Maceration, Atrophie Blanche, Cyanosis, Ecchymosis, Hemosiderin Staining, Mottled, Pallor, Rubor. The surrounding wound skin color is noted with erythema which is circumferential. Periwound temperature was noted as No Abnormality. The periwound has tenderness on palpation. Assessment Active Problems ICD-10 E11.622 - Type 2 diabetes mellitus with other skin ulcer S81.812A - Laceration without foreign body, left lower leg, initial encounter L97.222 - Non-pressure chronic ulcer of left calf with fat layer exposed I87.312 - Chronic venous hypertension (idiopathic) with ulcer of left lower extremity She is unable to don her compression stockings but is wearing something else which is half way there. have urged her to try and get some help to We will continue using medihoney an occlusive dressing and see her back next week before she travels on vacation. Procedures Wound #1 Wound #1 is a Trauma, Other located on the Left,Anterior Lower Leg . There was a Skin/Subcutaneous Tissue Debridement [id number]) debridement with total area of 4.8 sq cm performed by Christin Fudge, MD. with the following instrument(s): Curette to remove Viable and Non-Viable tissue/material including Exudate, Fibrin/Slough, and Subcutaneous after achieving pain control using Lidocaine 4% Topical Solution. A time out was conducted prior to the start of  the procedure. A Minimum amount of bleeding was controlled with Pressure. The procedure was tolerated well with a pain level of 0 throughout and a pain level of 0 following the procedure. Post Debridement Measurements: 2cm length x 2.4cm width x 0.2cm depth; 0.754cm^3 volume. Post procedure Diagnosis Wound #1: Same as Pre-Procedure Bracknell, Tanyika R. ([id number]) Plan Wound Cleansing: Wound #1 Left,Anterior Lower Leg: Clean wound with Normal Saline. May Shower, gently pat wound dry prior to applying new dressing. Anesthetic: Wound #1 Left,Anterior Lower Leg: Topical Lidocaine 4% cream applied to wound bed prior to debridement Primary Wound Dressing: Wound #1 Left,Anterior Lower Leg: Medihoney gel Secondary Dressing: Wound #1 Left,Anterior Lower Leg: Boardered Foam Dressing Non-adherent pad Dressing Change Frequency: Wound #1 Left,Anterior Lower Leg: Change dressing every day. Follow-up Appointments: Wound #1 Left,Anterior Lower Leg: Return Appointment in 1 week. Edema Control: Wound #1 Left,Anterior Lower Leg: Patient to wear own compression stockings Elevate legs to the level of the heart and pump ankles as often as possible She is unable to don her compression stockings but is wearing something else which is half way there. have urged her to  try and get some help to We will continue using medihoney an occlusive dressing and see her back next week before she travels on vacation. Electronic Signature(s) Signed: 04/03/2015 11:08:27 AM By: Christin Fudge MD, FACS Entered By: Christin Fudge on 04/03/2015 11:08:27 [name] ([id number]) -------------------------------------------------------------------------------- SuperBill Details Patient Name: [name] Date of Service: 04/03/2015 Medical Record Patient Account Number: [id number] Number: Afful, RN, BSN, Treating RN: 18-Dec-1931 (82 y.o. Velva Harman Date of Birth/Sex: Female) Other Clinician: Primary Care  Physician: Margarita Rana Treating Christin Fudge Referring Physician: Margarita Rana Physician/Extender: Suella Grove in Treatment: 2 Diagnosis Coding ICD-10 Codes Code Description E11.622 Type 2 diabetes mellitus with other skin ulcer S81.812A Laceration without foreign body, left lower leg, initial encounter L97.222 Non-pressure chronic ulcer of left calf with fat layer exposed I87.312 Chronic venous hypertension (idiopathic) with ulcer of left lower extremity Facility Procedures CPT4 Code Description: [id number] - DEB SUBQ TISSUE 20 SQ CM/< ICD-10 Description Diagnosis E11.622 Type 2 diabetes mellitus with other skin ulcer S81.812A Laceration without foreign body, left lower leg, initi L97.222 Non-pressure chronic ulcer of  left calf with fat layer I87.312 Chronic venous hypertension (idiopathic) with ulcer of Modifier: al encounter exposed left lower e Quantity: 1 xtremity Physician Procedures CPT4 Code Description: [id number] - WC PHYS SUBQ TISS 20 SQ CM ICD-10 Description Diagnosis E11.622 Type 2 diabetes mellitus with other skin ulcer S81.812A Laceration without foreign body, left lower leg, initi L97.222 Non-pressure chronic ulcer of  left calf with fat layer I87.312 Chronic venous hypertension (idiopathic) with ulcer of Modifier: al encounter exposed left lower ex Quantity: 1 tremity Electronic Signature(s) Signed: 04/03/2015 11:08:39 AM By: Christin Fudge MD, FACS Entered By: Christin Fudge on 04/03/2015 11:08:39

## 2015-04-03 NOTE — Progress Notes (Signed)
[name], [name] ([id number]) Visit Report for 04/03/2015 Arrival Information Details Patient Name: [name], [name] Date of Service: 04/03/2015 10:45 AM Medical Record Number: [id number] Patient Account Number: [id number] Date of Birth/Sex: [birth year] (82 y.o. Female) Treating RN: Afful, RN, BSN, Velva Harman Primary Care Physician: Margarita Rana Other Clinician: Referring Physician: Margarita Rana Treating Physician/Extender: Frann Rider in Treatment: 2 Visit Information History Since Last Visit Added or deleted any medications: No Patient Arrived: Ambulatory Any new allergies or adverse reactions: No Arrival Time: 10:39 Had a fall or experienced change in No Accompanied By: SELF activities of daily living that may affect Transfer Assistance: None risk of falls: Patient Identification Verified: Yes Signs or symptoms of abuse/neglect since last No Secondary Verification Process Yes visito Completed: Has Dressing in Place as Prescribed: Yes Patient Has Alerts: Yes Pain Present Now: No Patient Alerts: DMII Electronic Signature(s) Signed: 04/03/2015 1:00:56 PM By: Regan Lemming BSN, RN Entered By: Regan Lemming on 04/03/2015 10:43:05 [name] ([id number]) -------------------------------------------------------------------------------- Encounter Discharge Information Details Patient Name: [name] Date of Service: 04/03/2015 10:45 AM Medical Record Number: [id number] Patient Account Number: [id number] Date of Birth/Sex: [birth year] (83 y.o. Female) Treating RN: Baruch Gouty, RN, BSN, Velva Harman Primary Care Physician: Margarita Rana Other Clinician: Referring Physician: Margarita Rana Treating Physician/Extender: Frann Rider in Treatment: 2 Encounter Discharge Information Items Discharge Pain Level: 0 Discharge Condition: Stable Ambulatory Status: Ambulatory Discharge Destination: Home Transportation: Private Auto Accompanied By: self Schedule Follow-up  Appointment: No Medication Reconciliation completed and provided to Patient/Care No Marcanthony Sleight: Provided on Clinical Summary of Care: 04/03/2015 Form Type Recipient Paper Patient [facility] Electronic Signature(s) Signed: 04/03/2015 11:04:32 AM By: Ruthine Dose Previous Signature: 04/03/2015 10:53:22 AM Version By: Regan Lemming BSN, RN Entered By: Ruthine Dose on 04/03/2015 11:04:31 [name] ([id number]) -------------------------------------------------------------------------------- Lower Extremity Assessment Details Patient Name: [name] Date of Service: 04/03/2015 10:45 AM Medical Record Number: [id number] Patient Account Number: [id number] Date of Birth/Sex: [birth year] (83 y.o. Female) Treating RN: Afful, RN, BSN, Velva Harman Primary Care Physician: Margarita Rana Other Clinician: Referring Physician: Margarita Rana Treating Physician/Extender: Frann Rider in Treatment: 2 Edema Assessment Assessed: Shirlyn Goltz: No] [Right: No] E[Left: dema] [Right: :] Calf Left: Right: Point of Measurement: 30 cm From Medial Instep cm cm Ankle Left: Right: Point of Measurement: 10 cm From Medial Instep cm cm Vascular Assessment Claudication: Claudication Assessment [Left:None] Pulses: Posterior Tibial Dorsalis Pedis Palpable: [Left:Yes] Extremity colors, hair growth, and conditions: Extremity Color: [Left:Mottled] Hair Growth on Extremity: [Left:No] Temperature of Extremity: [Left:Warm] Capillary Refill: [Left:< 3 seconds] Toe Nail Assessment Left: Right: Thick: No Discolored: No Deformed: No Improper Length and Hygiene: No Electronic Signature(s) Signed: 04/03/2015 10:49:51 AM By: Regan Lemming BSN, RN Entered By: Regan Lemming on 04/03/2015 10:49:51 Cherubini, Clemens Catholic ([id number]) Jarvie, Clemens Catholic ([id number]) -------------------------------------------------------------------------------- Multi Wound Chart Details Patient Name: [name] Date of Service:  04/03/2015 10:45 AM Medical Record Number: [id number] Patient Account Number: [id number] Date of Birth/Sex: [birth year] (83 y.o. Female) Treating RN: Baruch Gouty, RN, BSN, Velva Harman Primary Care Physician: Margarita Rana Other Clinician: Referring Physician: Margarita Rana Treating Physician/Extender: Frann Rider in Treatment: 2 Vital Signs Height(in): 61 Pulse(bpm): 63 Weight(lbs): 175 Blood Pressure 142/63 (mmHg): Body Mass Index(BMI): 33 Temperature(F): 97.6 Respiratory Rate 18 (breaths/min): Photos: [1:No Photos] [N/A:N/A] Wound Location: [1:Left Lower Leg - Anterior] [N/A:N/A] Wounding Event: [1:Trauma] [N/A:N/A] Primary Etiology: [1:Trauma, Other] [N/A:N/A] Comorbid History: [1:Hypertension, Colitis, Type II Diabetes, Osteoarthritis] [N/A:N/A] Date Acquired: [1:02/28/2015] [N/A:N/A] Weeks of Treatment: [1:2] [N/A:N/A] Wound Status: [1:Open] [N/A:N/A] Measurements  L x W x D 2x2.4x0.2 [N/A:N/A] (cm) Area (cm) : [1:3.77] [N/A:N/A] Volume (cm) : [1:0.754] [N/A:N/A] % Reduction in Area: [1:56.00%] [N/A:N/A] % Reduction in Volume: 56.00% [N/A:N/A] Classification: [1:Full Thickness Without Exposed Support Structures] [N/A:N/A] HBO Classification: [1:Grade 1] [N/A:N/A] Exudate Amount: [1:Medium] [N/A:N/A] Exudate Type: [1:Serosanguineous] [N/A:N/A] Exudate Color: [1:red, brown] [N/A:N/A] Wound Margin: [1:Flat and Intact] [N/A:N/A] Granulation Amount: [1:Small (1-33%)] [N/A:N/A] Granulation Quality: [1:Red, Pink] [N/A:N/A] Necrotic Amount: [1:Medium (34-66%)] [N/A:N/A] Exposed Structures: [1:Fascia: No Fat: No Tendon: No] [N/A:N/A] Muscle: No Joint: No Bone: No Limited to Skin Breakdown Epithelialization: Small (1-33%) N/A N/A Periwound Skin Texture: Edema: Yes N/A N/A Excoriation: No Induration: No Callus: No Crepitus: No Fluctuance: No Friable: No Rash: No Scarring: No Periwound Skin Moist: Yes N/A N/A Moisture: Maceration: No Dry/Scaly: No Periwound Skin  Color: Erythema: Yes N/A N/A Atrophie Blanche: No Cyanosis: No Ecchymosis: No Hemosiderin Staining: No Mottled: No Pallor: No Rubor: No Erythema Location: Circumferential N/A N/A Temperature: No Abnormality N/A N/A Tenderness on Yes N/A N/A Palpation: Wound Preparation: Ulcer Cleansing: N/A N/A Rinsed/Irrigated with Saline Topical Anesthetic Applied: Other: lidocaine 4% Treatment Notes Electronic Signature(s) Signed: 04/03/2015 1:00:56 PM By: Regan Lemming BSN, RN Entered By: Regan Lemming on 04/03/2015 10:57:41 AAYUSHI, COAKER ([id number]) -------------------------------------------------------------------------------- [place] Details Patient Name: [name] Date of Service: 04/03/2015 10:45 AM Medical Record Number: [id number] Patient Account Number: [id number] Date of Birth/Sex: April 02, 1931 (83 y.o. Female) Treating RN: Afful, RN, BSN, Velva Harman Primary Care Physician: Margarita Rana Other Clinician: Referring Physician: Margarita Rana Treating Physician/Extender: Frann Rider in Treatment: 2 Active Inactive Abuse / Safety / Falls / Self Care Management Nursing Diagnoses: Impaired physical mobility Potential for falls Goals: Patient will remain injury free Date Initiated: 03/20/2015 Goal Status: Active Interventions: Assess fall risk on admission and as needed Notes: Orientation to the Wound Care Program Nursing Diagnoses: Knowledge deficit related to the wound healing center program Goals: Patient/caregiver will verbalize understanding of the [facility] Date Initiated: 03/20/2015 Goal Status: Active Interventions: Provide education on orientation to the wound center Notes: Wound/Skin Impairment Nursing Diagnoses: Impaired tissue integrity Goals: Patient/caregiver will verbalize understanding of skin care regimen CONI, NAGAI ([id number]) Date Initiated: 03/20/2015 Goal Status: Active Ulcer/skin  breakdown will have a volume reduction of 30% by week 4 Date Initiated: 03/20/2015 Goal Status: Active Ulcer/skin breakdown will have a volume reduction of 50% by week 8 Date Initiated: 03/20/2015 Goal Status: Active Ulcer/skin breakdown will have a volume reduction of 80% by week 12 Date Initiated: 03/20/2015 Goal Status: Active Ulcer/skin breakdown will heal within 14 weeks Date Initiated: 03/20/2015 Goal Status: Active Interventions: Assess patient/caregiver ability to obtain necessary supplies Assess patient/caregiver ability to perform ulcer/skin care regimen upon admission and as needed Assess ulceration(s) every visit Notes: Electronic Signature(s) Signed: 04/03/2015 1:00:56 PM By: Regan Lemming BSN, RN Entered By: Regan Lemming on 04/03/2015 10:56:40 [name] ([id number]) -------------------------------------------------------------------------------- Pain Assessment Details Patient Name: [name] Date of Service: 04/03/2015 10:45 AM Medical Record Number: [id number] Patient Account Number: [id number] Date of Birth/Sex: [birth year] (83 y.o. Female) Treating RN: Baruch Gouty, RN, BSN, Velva Harman Primary Care Physician: Margarita Rana Other Clinician: Referring Physician: Margarita Rana Treating Physician/Extender: Frann Rider in Treatment: 2 Active Problems Location of Pain Severity and Description of Pain Patient Has Paino No Site Locations Pain Management and Medication Current Pain Management: Electronic Signature(s) Signed: 04/03/2015 1:00:56 PM By: Regan Lemming BSN, RN Entered By: Regan Lemming on 04/03/2015 10:43:40 Brundidge, Clemens Catholic ([id number]) -------------------------------------------------------------------------------- Patient/Caregiver Education  Details Patient Name: ADITHRI, CASHELL Date of Service: 04/03/2015 10:45 AM Medical Record Number: [id number] Patient Account Number: [id number] Date of Birth/Gender: 1931/11/19 (82 y.o. Female) Treating RN:  Baruch Gouty, RN, BSN, Velva Harman Primary Care Physician: Margarita Rana Other Clinician: Referring Physician: Margarita Rana Treating Physician/Extender: Frann Rider in Treatment: 2 Education Assessment Education Provided To: Patient Education Topics Provided Basic Hygiene: Methods: Explain/Verbal Responses: State content correctly Welcome To [facility] [place]: Methods: Explain/Verbal Electronic Signature(s) Signed: 04/03/2015 10:53:48 AM By: Regan Lemming BSN, RN Entered By: Regan Lemming on 04/03/2015 10:53:48 [name] ([id number]) -------------------------------------------------------------------------------- Wound Assessment Details Patient Name: [name] Date of Service: 04/03/2015 10:45 AM Medical Record Number: [id number] Patient Account Number: [id number] Date of Birth/Sex: Sep 07, 1931 (83 y.o. Female) Treating RN: Afful, RN, BSN, [place] Primary Care Physician: Margarita Rana Other Clinician: Referring Physician: Margarita Rana Treating Physician/Extender: Frann Rider in Treatment: 2 Wound Status Wound Number: 1 Primary Trauma, Other Etiology: Wound Location: Left Lower Leg - Anterior Wound Open Wounding Event: Trauma Status: Date Acquired: 02/28/2015 Comorbid Hypertension, Colitis, Type II Weeks Of Treatment: 2 History: Diabetes, Osteoarthritis Clustered Wound: No Photos Photo Uploaded By: Regan Lemming on 04/03/2015 13:00:30 Wound Measurements Length: (cm) 2 Width: (cm) 2.4 Depth: (cm) 0.2 Area: (cm) 3.77 Volume: (cm) 0.754 % Reduction in Area: 56% % Reduction in Volume: 56% Epithelialization: Small (1-33%) Tunneling: No Undermining: No Wound Description Full Thickness Without Exposed Classification: Support Structures Diabetic Severity Grade 1 (Wagner): Wound Margin: Flat and Intact Exudate Amount: Medium Exudate Type: Serosanguineous Exudate Color: red, brown Foul Odor After Cleansing: No Wound Bed Granulation Amount:  Small (1-33%) Exposed Structure Granulation Quality: Red, Pink Fascia Exposed: No Bedel, Mairlyn R. ([id number]) Necrotic Amount: Medium (34-66%) Fat Layer Exposed: No Necrotic Quality: Adherent Slough Tendon Exposed: No Muscle Exposed: No Joint Exposed: No Bone Exposed: No Limited to Skin Breakdown Periwound Skin Texture Texture Color No Abnormalities Noted: No No Abnormalities Noted: No Callus: No Atrophie Blanche: No Crepitus: No Cyanosis: No Excoriation: No Ecchymosis: No Fluctuance: No Erythema: Yes Friable: No Erythema Location: Circumferential Induration: No Hemosiderin Staining: No Localized Edema: Yes Mottled: No Rash: No Pallor: No Scarring: No Rubor: No Moisture Temperature / Pain No Abnormalities Noted: No Temperature: No Abnormality Dry / Scaly: No Tenderness on Palpation: Yes Maceration: No Moist: Yes Wound Preparation Ulcer Cleansing: Rinsed/Irrigated with Saline Topical Anesthetic Applied: Other: lidocaine 4%, Treatment Notes Wound #1 (Left, Anterior Lower Leg) 1. Cleansed with: Clean wound with Normal Saline 4. Dressing Applied: Medihoney Gel 5. Secondary Dressing Applied Bordered Foam Dressing Electronic Signature(s) Signed: 04/03/2015 1:00:56 PM By: Regan Lemming BSN, RN Entered By: Regan Lemming on 04/03/2015 10:48:09 JENOVA, THUMAN ([id number]) -------------------------------------------------------------------------------- Vitals Details Patient Name: [name] Date of Service: 04/03/2015 10:45 AM Medical Record Number: [id number] Patient Account Number: [id number] Date of Birth/Sex: 1932-03-04 (83 y.o. Female) Treating RN: Afful, RN, BSN, [place] Primary Care Physician: Margarita Rana Other Clinician: Referring Physician: Margarita Rana Treating Physician/Extender: Frann Rider in Treatment: 2 Vital Signs Time Taken: 10:44 Temperature (F): 97.6 Height (in): 61 Pulse (bpm): 63 Weight (lbs): 175 Respiratory Rate  (breaths/min): 18 Body Mass Index (BMI): 33.1 Blood Pressure (mmHg): 142/63 Reference Range: 80 - 120 mg / dl Electronic Signature(s) Signed: 04/03/2015 1:00:56 PM By: Regan Lemming BSN, RN Entered By: Regan Lemming on 04/03/2015 10:44:01

## 2015-04-09 ENCOUNTER — Encounter: Payer: Medicare Other | Admitting: Surgery

## 2015-04-09 DIAGNOSIS — E11622 Type 2 diabetes mellitus with other skin ulcer: Secondary | ICD-10-CM | POA: Diagnosis not present

## 2015-04-09 DIAGNOSIS — M1990 Unspecified osteoarthritis, unspecified site: Secondary | ICD-10-CM | POA: Diagnosis not present

## 2015-04-09 DIAGNOSIS — I10 Essential (primary) hypertension: Secondary | ICD-10-CM | POA: Diagnosis not present

## 2015-04-09 DIAGNOSIS — Z87891 Personal history of nicotine dependence: Secondary | ICD-10-CM | POA: Diagnosis not present

## 2015-04-09 DIAGNOSIS — L97222 Non-pressure chronic ulcer of left calf with fat layer exposed: Secondary | ICD-10-CM | POA: Diagnosis not present

## 2015-04-09 DIAGNOSIS — Z880 Allergy status to penicillin: Secondary | ICD-10-CM | POA: Diagnosis not present

## 2015-04-09 DIAGNOSIS — I87312 Chronic venous hypertension (idiopathic) with ulcer of left lower extremity: Secondary | ICD-10-CM | POA: Diagnosis not present

## 2015-04-09 DIAGNOSIS — S81812A Laceration without foreign body, left lower leg, initial encounter: Secondary | ICD-10-CM | POA: Diagnosis not present

## 2015-04-12 NOTE — Progress Notes (Signed)
TNYA, DENIS ([id number]) Visit Report for 04/09/2015 Chief Complaint Document Details Patient Name: [name], [name] 04/09/2015 12:45 Date of Service: PM Medical Record [id number] Number: Patient Account Number: [id number] 1931-03-18 (82 y.o. Treating RN: Ahmed Prima Date of Birth/Sex: Female) Other Clinician: Primary Care Physician: Margarita Rana Treating Christin Fudge Referring Physician: Margarita Rana Physician/Extender: Suella Grove in Treatment: 2 Information Obtained from: Patient Chief Complaint Patient presents to the wound care center for a consult due non healing wound to the left lower extremity which she's had for about 3 weeks now Electronic Signature(s) Signed: 04/09/2015 1:17:29 PM By: Christin Fudge MD, FACS Entered By: Christin Fudge on 04/09/2015 13:17:29 [name] ([id number]) -------------------------------------------------------------------------------- HPI Details Patient Name: [name] 04/09/2015 12:45 Date of Service: PM Medical Record [id number] Number: Patient Account Number: [id number] 02-Dec-1931 (82 y.o. Treating RN: Ahmed Prima Date of Birth/Sex: Female) Other Clinician: Primary Care Physician: Margarita Rana Treating Christin Fudge Referring Physician: Margarita Rana Physician/Extender: Suella Grove in Treatment: 2 History of Present Illness Location: lacerated wound to the left lower extremity Quality: Patient reports experiencing a dull pain to affected area(s). Severity: Patient states wound (s) are getting better. Duration: Patient has had the wound for < 3 weeks prior to presenting for treatment Timing: Pain in wound is Intermittent (comes and goes Context: The wound occurred when the patient had a blunt injury which caused a laceration to the left lower extremity Modifying Factors: Other treatment(s) tried include:had sutures placed in the ER and then had an infected wound which was treated with doxycycline Associated  Signs and Symptoms: Patient reports having increase swelling. HPI Description: 82 year old patient who had an injury to her left lower extremity on December 21 and went to the ED and had sutures and Steri-Strips applied and tetanus shot given but no antibiotics. She was then seen by her PCP on 03/13/2015 and at that time was noted to have atelectasis of the left lower extremity and hence was put on doxycycline 100 mg twice a day for 7 days. The sutures were removed and the wound was found to be gaping. past medical is trace significant for diabetes mellitus, arthritis, basal cell carcinoma, collagenous colitis, essential hypertension, avitaminosis D. she is also status post abdominal hysterectomy, lumbar discectomy, neck surgery. She is a former smoker and quit in 1979. of note in the recent past she was seen by Dr. Lucky Cowboy for what seems a sounds like injections of varicose veins. this was only around her ankles and not in the lower extremities. No Doppler studies were done. She was not advised to wear any compression stockings. 04/03/2015 -- she has received the compression stockings but is unable to wear them as she does not have the ability to don them. 04/09/2015 -- with a little bit of perseverance she has been able to use her compression stockings and has been doing this regularly. She is going to be away for 2 weeks visiting her family. Electronic Signature(s) Signed: 04/09/2015 1:18:12 PM By: Christin Fudge MD, FACS Entered By: Christin Fudge on 04/09/2015 13:18:11 [name], [name] ([id number]) -------------------------------------------------------------------------------- Physical Exam Details Patient Name: [name], [name] 04/09/2015 12:45 Date of Service: PM Medical Record [id number] Number: Patient Account Number: [id number] 1932/01/29 (82 y.o. Treating RN: Ahmed Prima Date of Birth/Sex: Female) Other Clinician: Primary Care Physician: Margarita Rana Treating Christin Fudge Referring Physician: Margarita Rana Physician/Extender: Suella Grove in Treatment: 2 Constitutional . Pulse regular. Respirations normal and unlabored. Afebrile. . Eyes Nonicteric. Reactive to light. Ears, Nose, Mouth, and Throat Lips, teeth,  and gums WNL.Marland Kitchen Moist mucosa without lesions. Neck supple and nontender. No palpable supraclavicular or cervical adenopathy. Normal sized without goiter. Respiratory WNL. No retractions.. Cardiovascular Pedal Pulses WNL. No clubbing, cyanosis or edema. Lymphatic No adneopathy. No adenopathy. No adenopathy. Musculoskeletal Adexa without tenderness or enlargement.. Digits and nails w/o clubbing, cyanosis, infection, petechiae, ischemia, or inflammatory conditions.. Integumentary (Hair, Skin) No suspicious lesions. No crepitus or fluctuance. No peri-wound warmth or erythema. No masses.Marland Kitchen Psychiatric Judgement and insight Intact.. No evidence of depression, anxiety, or agitation.. Notes today the subcutaneous debris isn't much lighter and I was able to clean it with moist saline gauze and no sharp debridement was required. She has got significant amount of healthy granulation tissue Electronic Signature(s) Signed: 04/09/2015 1:19:12 PM By: Christin Fudge MD, FACS Entered By: Christin Fudge on 04/09/2015 13:19:11 [name], [name] ([id number]) -------------------------------------------------------------------------------- Physician Orders Details Patient Name: [name], [name] 04/09/2015 12:45 Date of Service: PM Medical Record [id number] Number: Patient Account Number: [id number] November 05, 1931 (82 y.o. Treating RN: Ahmed Prima Date of Birth/Sex: Female) Other Clinician: Primary Care Physician: Margarita Rana Treating Christin Fudge Referring Physician: Margarita Rana Physician/Extender: Suella Grove in Treatment: 2 Verbal / Phone Orders: Yes Clinician: Carolyne Fiscal, Debi Read Back and Verified: Yes Diagnosis Coding Wound Cleansing Wound #1  Left,Anterior Lower Leg o Clean wound with Normal Saline. o May Shower, gently pat wound dry prior to applying new dressing. Anesthetic Wound #1 Left,Anterior Lower Leg o Topical Lidocaine 4% cream applied to wound bed prior to debridement Primary Wound Dressing Wound #1 Left,Anterior Lower Leg o Prisma Ag - Use the Prisma today then starting Wednesday use the Medihoney daily until gone and then start using the Prisma every other day. Secondary Dressing Wound #1 Left,Anterior Lower Leg o Dry Gauze o Boardered Foam Dressing Dressing Change Frequency Wound #1 Left,Anterior Lower Leg o Change dressing every day. Follow-up Appointments Wound #1 Left,Anterior Lower Leg o Return Appointment in 2 weeks. Edema Control Wound #1 Left,Anterior Lower Leg o Patient to wear own compression stockings o Elevate legs to the level of the heart and pump ankles as often as possible Whipp, Laila R. ([id number]) Notes Use the Prisma today then starting Wednesday use the Medihoney daily until gone and then start using the Prisma every other day. Electronic Signature(s) Signed: 04/09/2015 4:26:31 PM By: Christin Fudge MD, FACS Signed: 04/11/2015 5:03:09 PM By: Alric Quan Entered By: Alric Quan on 04/09/2015 13:19:10 [name], [name] ([id number]) -------------------------------------------------------------------------------- Problem List Details Patient Name: [name], [name] 04/09/2015 12:45 Date of Service: PM Medical Record [id number] Number: Patient Account Number: [id number] 1931-05-31 (82 y.o. 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 Treating RN: Ahmed Prima Date of Birth/Sex: Female) Other Clinician: Primary Care Physician: Margarita Rana Treating Christin Fudge Referring Physician:  Margarita Rana Physician/Extender: Suella Grove in Treatment: 2 Subjective Chief Complaint Information obtained from Patient Patient presents to the wound care center for a consult due non healing wound to the left lower extremity which she's had for about 3 weeks now History of Present Illness (HPI) The following HPI elements were documented for the patient's wound: Location: lacerated wound to the left lower extremity Quality: Patient reports experiencing a dull pain to affected area(s). Severity: Patient states wound (s) are getting better. Duration: Patient has had the wound for < 3 weeks prior to presenting for treatment Timing: Pain in wound is Intermittent (comes and goes Context: The wound occurred when the patient had a blunt injury which caused a laceration to the left lower extremity Modifying Factors: Other treatment(s) tried include:had sutures placed in the ER and then had an infected wound which was treated with doxycycline Associated Signs and Symptoms: Patient reports having increase  swelling. 82 year old patient who had an injury to her left lower extremity on December 21 and went to the ED and had sutures and Steri-Strips applied and tetanus shot given but no antibiotics. She was then seen by her PCP on 03/13/2015 and at that time was noted to have atelectasis of the left lower extremity and hence was put on doxycycline 100 mg twice a day for 7 days. The sutures were removed and the wound was found to be gaping. past medical is trace significant for diabetes mellitus, arthritis, basal cell carcinoma, collagenous colitis, essential hypertension, avitaminosis D. she is also status post abdominal hysterectomy, lumbar discectomy, neck surgery. She is a former smoker and quit in 1979. of note in the recent past she was seen by Dr. Lucky Cowboy for what seems a sounds like injections of varicose veins. this was only around her ankles and not in the lower extremities. No Doppler studies were done. She was not advised to wear any compression stockings. 04/03/2015 -- she has received the compression stockings but is unable to wear them as she does not have the ability to don them. 04/09/2015 -- with a little bit of perseverance she has been able to use her compression stockings and has been doing this regularly. She is going to be away for 2 weeks visiting her family. [name], [name] ([id number]) Objective Constitutional Pulse regular. Respirations normal and unlabored. Afebrile. Vitals Time Taken: 12:51 PM, Height: 61 in, Weight: 175 lbs, BMI: 33.1, Temperature: 98.0 F, Pulse: 80 bpm, Respiratory Rate: 18 breaths/min, Blood Pressure: 152/55 mmHg. Eyes Nonicteric. Reactive to light. Ears, Nose, Mouth, and Throat Lips, teeth, and gums WNL.Marland Kitchen Moist mucosa without lesions. Neck supple and nontender. No palpable supraclavicular or cervical adenopathy. Normal sized without goiter. Respiratory WNL. No retractions.. Cardiovascular Pedal Pulses WNL. No clubbing, cyanosis or  edema. Lymphatic No adneopathy. No adenopathy. No adenopathy. Musculoskeletal Adexa without tenderness or enlargement.. Digits and nails w/o clubbing, cyanosis, infection, petechiae, ischemia, or inflammatory conditions.Marland Kitchen Psychiatric Judgement and insight Intact.. No evidence of depression, anxiety, or agitation.. General Notes: today the subcutaneous debris isn't much lighter and I was able to clean it with moist saline gauze and no sharp debridement was required. She has got significant amount of healthy granulation tissue Integumentary (Hair, Skin) No suspicious lesions. No crepitus or fluctuance. No peri-wound warmth or erythema. No masses.. Wound #1 status is Open. Original cause of wound was Trauma. The wound is located on the Left,Anterior Lower Leg. The wound measures 5cm length x 1cm width x 0.1cm depth; 3.927cm^2 area and 0.393cm^3 volume. The wound is limited  to skin breakdown. There is no tunneling or undermining noted. There is a large amount of serous drainage noted. The wound margin is flat and intact. There is small (1-33%) red, pink granulation within the wound bed. There is a large (67-100%) amount of necrotic tissue within the wound bed including Adherent Slough. The periwound skin appearance exhibited: Localized Edema, Moist, [name], [name] ([id number]) Erythema. The periwound skin appearance did not exhibit: Callus, Crepitus, Excoriation, Fluctuance, Friable, Induration, Rash, Scarring, Dry/Scaly, Maceration, Atrophie Blanche, Cyanosis, Ecchymosis, Hemosiderin Staining, Mottled, Pallor, Rubor. The surrounding wound skin color is noted with erythema which is circumferential. Periwound temperature was noted as No Abnormality. The periwound has tenderness on palpation. Assessment Active Problems ICD-10 E11.622 - Type 2 diabetes mellitus with other skin ulcer S81.812A - Laceration without foreign body, left lower leg, initial encounter L97.222 - Non-pressure chronic  ulcer of left calf with fat layer exposed I87.312 - Chronic venous hypertension (idiopathic) with ulcer of left lower extremity I have switched over to [facility] and we will do this every other day. She has enough of the materials to do her dressing and will come back to see as in 2 weeks. Plan Wound Cleansing: Wound #1 Left,Anterior Lower Leg: Clean wound with Normal Saline. May Shower, gently pat wound dry prior to applying new dressing. Anesthetic: Wound #1 Left,Anterior Lower Leg: Topical Lidocaine 4% cream applied to wound bed prior to debridement Primary Wound Dressing: Wound #1 Left,Anterior Lower Leg: Prisma Ag - Use the Prisma today then starting Wednesday use the Medihoney daily until gone and then start using the Prisma every other day. Secondary Dressing: Wound #1 Left,Anterior Lower Leg: Dry Gauze Boardered Foam Dressing Dressing Change Frequency: Wound #1 Left,Anterior Lower Leg: Change dressing every day. Follow-up Appointments: Wound #1 Left,Anterior Lower Leg: [name], [name] ([id number]) Return Appointment in 2 weeks. Edema Control: Wound #1 Left,Anterior Lower Leg: Patient to wear own compression stockings Elevate legs to the level of the heart and pump ankles as often as possible General Notes: Use the Prisma today then starting Wednesday use the Medihoney daily until gone and then start using the Prisma every other day. I have switched over to [facility] and we will do this every other day. She has enough of the materials to do her dressing and will come back to see as in 2 weeks. Electronic Signature(s) Signed: 04/09/2015 1:19:47 PM By: Christin Fudge MD, FACS Entered By: Christin Fudge on 04/09/2015 13:19:47 [name], [name] ([id number]) -------------------------------------------------------------------------------- SuperBill Details Patient Name: [name] Date of Service: 04/09/2015 Medical Record Number: [id number] Patient Account Number:  [id number] Date of Birth/Sex: [birth year] (83 y.o. Female) Treating RN: Carolyne Fiscal, Debi Primary Care Physician: Margarita Rana Other Clinician: Referring Physician: Margarita Rana Treating Physician/Extender: Frann Rider in Treatment: 2 Diagnosis Coding ICD-10 Codes Code Description [phone] Type 2 diabetes mellitus with other skin ulcer S81.812A Laceration without foreign body, left lower leg, initial encounter L97.222 Non-pressure chronic ulcer of left calf with fat layer exposed I87.312 Chronic venous hypertension (idiopathic) with ulcer of left lower extremity Facility Procedures CPT4 Code: [id number] Description: [phone] - WOUND CARE VISIT-LEV 2 EST PT Modifier: Quantity: 1 Physician Procedures CPT4 Code Description: [id number] - WC PHYS LEVEL 3 - EST PT ICD-10 Description Diagnosis E11.622 Type 2 diabetes mellitus with other skin ulcer S81.812A Laceration without foreign body, left lower leg, initi L97.222 Non-pressure chronic ulcer of left  calf with fat layer I87.312 Chronic venous hypertension (idiopathic) with ulcer of Modifier: al encounter exposed left  lower Quantity: 1 extremity Electronic Signature(s) Signed: 04/09/2015 4:26:31 PM By: Christin Fudge MD, FACS Signed: 04/11/2015 5:03:09 PM By: Alric Quan Previous Signature: 04/09/2015 1:20:05 PM Version By: Christin Fudge MD, FACS Entered By: Alric Quan on 04/09/2015 13:47:02

## 2015-04-13 NOTE — Progress Notes (Signed)
COURTNE, SIBLE ([id number]) Visit Report for 04/09/2015 Arrival Information Details Patient Name: HENESSEY, VANBUREN Date of Service: 04/09/2015 12:45 PM Medical Record Number: [id number] Patient Account Number: [id number] Date of Birth/Sex: 1931-05-13 (82 y.o. Female) Treating RN: Carolyne Fiscal, Debi Primary Care Physician: Margarita Rana Other Clinician: Referring Physician: Margarita Rana Treating Physician/Extender: Frann Rider in Treatment: 2 Visit Information History Since Last Visit All ordered tests and consults were completed: No Patient Arrived: Ambulatory Added or deleted any medications: No Arrival Time: 12:50 Any new allergies or adverse reactions: No Accompanied By: self Had a fall or experienced change in No Transfer Assistance: None activities of daily living that may affect Patient Identification Verified: Yes risk of falls: Secondary Verification Process Yes Signs or symptoms of abuse/neglect since last No Completed: visito Patient Requires Transmission-Based No Hospitalized since last visit: No Precautions: Pain Present Now: No Patient Has Alerts: Yes Patient Alerts: DMII Electronic Signature(s) Signed: 04/11/2015 5:03:09 PM By: Alric Quan Entered By: Alric Quan on 04/09/2015 12:50:53 Chasse, Clemens Catholic ([id number]) -------------------------------------------------------------------------------- Clinic Level of Care Assessment Details Patient Name: [name] Date of Service: 04/09/2015 12:45 PM Medical Record Number: [id number] Patient Account Number: [id number] Date of Birth/Sex: 03-Sep-1931 (83 y.o. Female) Treating RN: Carolyne Fiscal, Debi Primary Care Physician: Margarita Rana Other Clinician: Referring Physician: Margarita Rana Treating Physician/Extender: Frann Rider in Treatment: 2 Clinic Level of Care Assessment Items TOOL 4 Quantity Score []  - Use when only an EandM is performed on FOLLOW-UP visit 0 ASSESSMENTS -  Nursing Assessment / Reassessment []  - Reassessment of Co-morbidities (includes updates in patient status) 0 X - Reassessment of Adherence to Treatment Plan 1 5 ASSESSMENTS - Wound and Skin Assessment / Reassessment X - Simple Wound Assessment / Reassessment - one wound 1 5 []  - Complex Wound Assessment / Reassessment - multiple wounds 0 []  - Dermatologic / Skin Assessment (not related to wound area) 0 ASSESSMENTS - Focused Assessment []  - Circumferential Edema Measurements - multi extremities 0 []  - Nutritional Assessment / Counseling / Intervention 0 []  - Lower Extremity Assessment (monofilament, tuning fork, pulses) 0 []  - Peripheral Arterial Disease Assessment (using hand held doppler) 0 ASSESSMENTS - Ostomy and/or Continence Assessment and Care []  - Incontinence Assessment and Management 0 []  - Ostomy Care Assessment and Management (repouching, etc.) 0 PROCESS - Coordination of Care X - Simple Patient / Family Education for ongoing care 1 15 []  - Complex (extensive) Patient / Family Education for ongoing care 0 []  - Staff obtains Programmer, systems, Records, Test Results / Process Orders 0 []  - Staff telephones HHA, Nursing Homes / Clarify orders / etc 0 []  - Routine Transfer to another Facility (non-emergent condition) 0 Jeremiah, Janari R. ([id number]) []  - Routine Hospital Admission (non-emergent condition) 0 []  - New Admissions / Biomedical engineer / Ordering NPWT, Apligraf, etc. 0 []  - Emergency Hospital Admission (emergent condition) 0 X - Simple Discharge Coordination 1 10 []  - Complex (extensive) Discharge Coordination 0 PROCESS - Special Needs []  - Pediatric / Minor Patient Management 0 []  - Isolation Patient Management 0 []  - Hearing / Language / Visual special needs 0 []  - Assessment of Community assistance (transportation, D/C planning, etc.) 0 []  - Additional assistance / Altered mentation 0 []  - Support Surface(s) Assessment (bed, cushion, seat, etc.) 0 INTERVENTIONS -  Wound Cleansing / Measurement X - Simple Wound Cleansing - one wound 1 5 []  - Complex Wound Cleansing - multiple wounds 0 X - Wound Imaging (photographs - any number of wounds) 1 5 []  -  Wound Tracing (instead of photographs) 0 X - Simple Wound Measurement - one wound 1 5 []  - Complex Wound Measurement - multiple wounds 0 INTERVENTIONS - Wound Dressings X - Small Wound Dressing one or multiple wounds 1 10 []  - Medium Wound Dressing one or multiple wounds 0 []  - Large Wound Dressing one or multiple wounds 0 X - Application of Medications - topical 1 5 []  - Application of Medications - injection 0 INTERVENTIONS - Miscellaneous []  - External ear exam 0 Chianese, Margarit R. ([id number]) []  - Specimen Collection (cultures, biopsies, blood, body fluids, etc.) 0 []  - Specimen(s) / Culture(s) sent or taken to Lab for analysis 0 []  - Patient Transfer (multiple staff / Harrel Lemon Lift / Similar devices) 0 []  - Simple Staple / Suture removal (25 or less) 0 []  - Complex Staple / Suture removal (26 or more) 0 []  - Hypo / Hyperglycemic Management (close monitor of Blood Glucose) 0 []  - Ankle / Brachial Index (ABI) - do not check if billed separately 0 X - Vital Signs 1 5 Has the patient been seen at the hospital within the last three years: Yes Total Score: 70 Level Of Care: New/Established - Level 2 Electronic Signature(s) Signed: 04/11/2015 5:03:09 PM By: Alric Quan Entered By: Alric Quan on 04/09/2015 13:46:51 Ferreri, Clemens Catholic ([id number]) -------------------------------------------------------------------------------- Encounter Discharge Information Details Patient Name: [name] Date of Service: 04/09/2015 12:45 PM Medical Record Number: [id number] Patient Account Number: [id number] Date of Birth/Sex: 02-Jun-1931 (83 y.o. Female) Treating RN: Ahmed Prima Primary Care Physician: Margarita Rana Other Clinician: Referring Physician: Margarita Rana Treating Physician/Extender:  Frann Rider in Treatment: 2 Encounter Discharge Information Items Discharge Pain Level: 0 Discharge Condition: Stable Ambulatory Status: Ambulatory Discharge Destination: Home Transportation: Private Auto Accompanied By: self Schedule Follow-up Appointment: Yes Medication Reconciliation completed and provided to Patient/Care Yes Greg Eckrich: Provided on Clinical Summary of Care: 04/09/2015 Form Type Recipient Paper Patient [facility] Electronic Signature(s) Signed: 04/09/2015 1:23:47 PM By: Ruthine Dose Entered By: Ruthine Dose on 04/09/2015 13:23:47 [name] ([id number]) -------------------------------------------------------------------------------- Lower Extremity Assessment Details Patient Name: [name] Date of Service: 04/09/2015 12:45 PM Medical Record Number: [id number] Patient Account Number: [id number] Date of Birth/Sex: [birth year] (83 y.o. Female) Treating RN: Carolyne Fiscal, Debi Primary Care Physician: Margarita Rana Other Clinician: Referring Physician: Margarita Rana Treating Physician/Extender: Frann Rider in Treatment: 2 Vascular Assessment Pulses: Posterior Tibial Dorsalis Pedis Palpable: [Left:Yes] Extremity colors, hair growth, and conditions: Extremity Color: [Left:Mottled] Temperature of Extremity: [Left:Warm] Capillary Refill: [Left:< 3 seconds] Toe Nail Assessment Left: Right: Thick: No Discolored: No Deformed: No Improper Length and Hygiene: No Electronic Signature(s) Signed: 04/11/2015 5:03:09 PM By: Alric Quan Entered By: Alric Quan on 04/09/2015 12:56:30 Nusz, Clemens Catholic ([id number]) -------------------------------------------------------------------------------- Multi Wound Chart Details Patient Name: [name] Date of Service: 04/09/2015 12:45 PM Medical Record Number: [id number] Patient Account Number: [id number] Date of Birth/Sex: 1931-07-30 (83 y.o. Female) Treating RN: Ahmed Prima Primary Care Physician: Margarita Rana Other Clinician: Referring Physician: Margarita Rana Treating Physician/Extender: Frann Rider in Treatment: 2 Vital Signs Height(in): 61 Pulse(bpm): 80 Weight(lbs): 175 Blood Pressure 152/55 (mmHg): Body Mass Index(BMI): 33 Temperature(F): 98.0 Respiratory Rate 18 (breaths/min): Photos: [1:No Photos] [N/A:N/A] Wound Location: [1:Left Lower Leg - Anterior] [N/A:N/A] Wounding Event: [1:Trauma] [N/A:N/A] Primary Etiology: [1:Trauma, Other] [N/A:N/A] Comorbid History: [1:Hypertension, Colitis, Type II Diabetes, Osteoarthritis] [N/A:N/A] Date Acquired: [1:02/28/2015] [N/A:N/A] Weeks of Treatment: [1:2] [N/A:N/A] Wound Status: [1:Open] [N/A:N/A] Measurements L x W x D 5x1x0.1 [N/A:N/A] (cm) Area (cm) : [1:3.927] [  N/A:N/A] Volume (cm) : [1:0.393] [N/A:N/A] % Reduction in Area: [1:54.20%] [N/A:N/A] % Reduction in Volume: 77.10% [N/A:N/A] Classification: [1:Full Thickness Without Exposed Support Structures] [N/A:N/A] HBO Classification: [1:Grade 1] [N/A:N/A] Exudate Amount: [1:Large] [N/A:N/A] Exudate Type: [1:Serous] [N/A:N/A] Exudate Color: [1:amber] [N/A:N/A] Wound Margin: [1:Flat and Intact] [N/A:N/A] Granulation Amount: [1:Small (1-33%)] [N/A:N/A] Granulation Quality: [1:Red, Pink] [N/A:N/A] Necrotic Amount: [1:Large (67-100%)] [N/A:N/A] Exposed Structures: [1:Fascia: No Fat: No Tendon: No] [N/A:N/A] Muscle: No Joint: No Bone: No Limited to Skin Breakdown Epithelialization: Small (1-33%) N/A N/A Periwound Skin Texture: Edema: Yes N/A N/A Excoriation: No Induration: No Callus: No Crepitus: No Fluctuance: No Friable: No Rash: No Scarring: No Periwound Skin Moist: Yes N/A N/A Moisture: Maceration: No Dry/Scaly: No Periwound Skin Color: Erythema: Yes N/A N/A Atrophie Blanche: No Cyanosis: No Ecchymosis: No Hemosiderin Staining: No Mottled: No Pallor: No Rubor: No Erythema Location: Circumferential  N/A N/A Temperature: No Abnormality N/A N/A Tenderness on Yes N/A N/A Palpation: Wound Preparation: Ulcer Cleansing: N/A N/A Rinsed/Irrigated with Saline Topical Anesthetic Applied: Other: lidocaine 4% Treatment Notes Electronic Signature(s) Signed: 04/11/2015 5:03:09 PM By: Alric Quan Entered By: Alric Quan on 04/09/2015 13:00:35 ANALEYA, NORTHUP ([id number]) -------------------------------------------------------------------------------- [place] Details Patient Name: [name] Date of Service: 04/09/2015 12:45 PM Medical Record Number: [id number] Patient Account Number: [id number] Date of Birth/Sex: 1932/02/22 (83 y.o. Female) Treating RN: Carolyne Fiscal, Debi Primary Care Physician: Margarita Rana Other Clinician: Referring Physician: Margarita Rana Treating Physician/Extender: Frann Rider in Treatment: 2 Active Inactive Abuse / Safety / Falls / Self Care Management Nursing Diagnoses: Impaired physical mobility Potential for falls Goals: Patient will remain injury free Date Initiated: 03/20/2015 Goal Status: Active Interventions: Assess fall risk on admission and as needed Notes: Orientation to the Wound Care Program Nursing Diagnoses: Knowledge deficit related to the wound healing center program Goals: Patient/caregiver will verbalize understanding of the [facility] Date Initiated: 03/20/2015 Goal Status: Active Interventions: Provide education on orientation to the wound center Notes: Wound/Skin Impairment Nursing Diagnoses: Impaired tissue integrity Goals: Patient/caregiver will verbalize understanding of skin care regimen KRISTAN, LAURENS ([id number]) Date Initiated: 03/20/2015 Goal Status: Active Ulcer/skin breakdown will have a volume reduction of 30% by week 4 Date Initiated: 03/20/2015 Goal Status: Active Ulcer/skin breakdown will have a volume reduction of 50% by week 8 Date Initiated:  03/20/2015 Goal Status: Active Ulcer/skin breakdown will have a volume reduction of 80% by week 12 Date Initiated: 03/20/2015 Goal Status: Active Ulcer/skin breakdown will heal within 14 weeks Date Initiated: 03/20/2015 Goal Status: Active Interventions: Assess patient/caregiver ability to obtain necessary supplies Assess patient/caregiver ability to perform ulcer/skin care regimen upon admission and as needed Assess ulceration(s) every visit Notes: Electronic Signature(s) Signed: 04/11/2015 5:03:09 PM By: Alric Quan Entered By: Alric Quan on 04/09/2015 13:00:28 Packard, Clemens Catholic ([id number]) -------------------------------------------------------------------------------- Pain Assessment Details Patient Name: [name] Date of Service: 04/09/2015 12:45 PM Medical Record Number: [id number] Patient Account Number: [id number] Date of Birth/Sex: 09-Oct-1931 (83 y.o. Female) Treating RN: Ahmed Prima Primary Care Physician: Margarita Rana Other Clinician: Referring Physician: Margarita Rana Treating Physician/Extender: Frann Rider in Treatment: 2 Active Problems Location of Pain Severity and Description of Pain Patient Has Paino No Site Locations Pain Management and Medication Current Pain Management: Electronic Signature(s) Signed: 04/11/2015 5:03:09 PM By: Alric Quan Entered By: Alric Quan on 04/09/2015 12:51:14 [name] ([id number]) -------------------------------------------------------------------------------- Patient/Caregiver Education Details Patient Name: [name] Date of Service: 04/09/2015 12:45 PM Medical Record Number: [id number] Patient Account Number: [id number] Date of Birth/Gender:  [birth year] (82 y.o. Female) Treating RN: Ahmed Prima Primary Care Physician: Margarita Rana Other Clinician: Referring Physician: Margarita Rana Treating Physician/Extender: Frann Rider in Treatment: 2 Education  Assessment Education Provided To: Patient Education Topics Provided Wound/Skin Impairment: Handouts: Other: change dressing as ordered Methods: Demonstration, Explain/Verbal Responses: State content correctly Electronic Signature(s) Signed: 04/11/2015 5:03:09 PM By: Alric Quan Entered By: Alric Quan on 04/09/2015 13:06:03 [name] ([id number]) -------------------------------------------------------------------------------- Wound Assessment Details Patient Name: [name] Date of Service: 04/09/2015 12:45 PM Medical Record Number: [id number] Patient Account Number: [id number] Date of Birth/Sex: July 05, 1931 (83 y.o. Female) Treating RN: Carolyne Fiscal, Debi Primary Care Physician: Margarita Rana Other Clinician: Referring Physician: Margarita Rana Treating Physician/Extender: Frann Rider in Treatment: 2 Wound Status Wound Number: 1 Primary Trauma, Other Etiology: Wound Location: Left Lower Leg - Anterior Wound Open Wounding Event: Trauma Status: Date Acquired: 02/28/2015 Comorbid Hypertension, Colitis, Type II Weeks Of Treatment: 2 History: Diabetes, Osteoarthritis Clustered Wound: No Photos Photo Uploaded By: Alric Quan on 04/09/2015 16:30:14 Wound Measurements Length: (cm) 5 Width: (cm) 1 Depth: (cm) 0.1 Area: (cm) 3.927 Volume: (cm) 0.393 % Reduction in Area: 54.2% % Reduction in Volume: 77.1% Epithelialization: Small (1-33%) Tunneling: No Undermining: No Wound Description Full Thickness Without Classification: Exposed Support Structures Diabetic Severity Grade 1 (Wagner): Wound Margin: Flat and Intact Exudate Amount: Large Exudate Type: Serous Exudate Color: amber Foul Odor After Cleansing: No Wound Bed Granulation Amount: Small (1-33%) Exposed Structure Granulation Quality: Red, Pink Fascia Exposed: No Geffert, Kamilah R. ([id number]) Necrotic Amount: Large (67-100%) Fat Layer Exposed: No Necrotic Quality:  Adherent Slough Tendon Exposed: No Muscle Exposed: No Joint Exposed: No Bone Exposed: No Limited to Skin Breakdown Periwound Skin Texture Texture Color No Abnormalities Noted: No No Abnormalities Noted: No Callus: No Atrophie Blanche: No Crepitus: No Cyanosis: No Excoriation: No Ecchymosis: No Fluctuance: No Erythema: Yes Friable: No Erythema Location: Circumferential Induration: No Hemosiderin Staining: No Localized Edema: Yes Mottled: No Rash: No Pallor: No Scarring: No Rubor: No Moisture Temperature / Pain No Abnormalities Noted: No Temperature: No Abnormality Dry / Scaly: No Tenderness on Palpation: Yes Maceration: No Moist: Yes Wound Preparation Ulcer Cleansing: Rinsed/Irrigated with Saline Topical Anesthetic Applied: Other: lidocaine 4%, Treatment Notes Wound #1 (Left, Anterior Lower Leg) 1. Cleansed with: Clean wound with Normal Saline 2. Anesthetic Topical Lidocaine 4% cream to wound bed prior to debridement 4. Dressing Applied: Prisma Ag 5. Secondary Dressing Applied Bordered Foam Dressing Dry Gauze Electronic Signature(s) Signed: 04/11/2015 5:03:09 PM By: Alric Quan Entered By: Alric Quan on 04/09/2015 13:00:21 QUINTAVIA, PALEO ([id number]) -------------------------------------------------------------------------------- [place] Details Patient Name: [name] Date of Service: 04/09/2015 12:45 PM Medical Record Number: [id number] Patient Account Number: [id number] Date of Birth/Sex: 18-Dec-1931 (82 y.o. Female) Treating RN: Carolyne Fiscal, Debi Primary Care Physician: Margarita Rana Other Clinician: Referring Physician: Margarita Rana Treating Physician/Extender: Frann Rider in Treatment: 2 Vital Signs Time Taken: 12:51 Temperature (F): 98.0 Height (in): 61 Pulse (bpm): 80 Weight (lbs): 175 Respiratory Rate (breaths/min): 18 Body Mass Index (BMI): 33.1 Blood Pressure (mmHg): 152/55 Reference Range: 80 - 120 mg /  dl Electronic Signature(s) Signed: 04/11/2015 5:03:09 PM By: Alric Quan Entered By: Alric Quan on 04/09/2015 12:54:14

## 2015-04-30 ENCOUNTER — Encounter: Payer: Medicare Other | Attending: Surgery | Admitting: Surgery

## 2015-04-30 DIAGNOSIS — I87312 Chronic venous hypertension (idiopathic) with ulcer of left lower extremity: Secondary | ICD-10-CM | POA: Insufficient documentation

## 2015-04-30 DIAGNOSIS — Z87891 Personal history of nicotine dependence: Secondary | ICD-10-CM | POA: Insufficient documentation

## 2015-04-30 DIAGNOSIS — E11622 Type 2 diabetes mellitus with other skin ulcer: Secondary | ICD-10-CM | POA: Diagnosis not present

## 2015-04-30 DIAGNOSIS — S81812A Laceration without foreign body, left lower leg, initial encounter: Secondary | ICD-10-CM | POA: Insufficient documentation

## 2015-04-30 DIAGNOSIS — I10 Essential (primary) hypertension: Secondary | ICD-10-CM | POA: Diagnosis not present

## 2015-04-30 DIAGNOSIS — E559 Vitamin D deficiency, unspecified: Secondary | ICD-10-CM | POA: Diagnosis not present

## 2015-04-30 DIAGNOSIS — L97222 Non-pressure chronic ulcer of left calf with fat layer exposed: Secondary | ICD-10-CM | POA: Diagnosis not present

## 2015-04-30 DIAGNOSIS — Z872 Personal history of diseases of the skin and subcutaneous tissue: Secondary | ICD-10-CM | POA: Diagnosis not present

## 2015-04-30 DIAGNOSIS — X58XXXA Exposure to other specified factors, initial encounter: Secondary | ICD-10-CM | POA: Insufficient documentation

## 2015-05-01 NOTE — Progress Notes (Signed)
[name] ([id number]) Visit Report for 04/30/2015 Arrival Information Details Patient Name: [name], [name] Date of Service: 04/30/2015 10:00 AM Medical Record Number: [id number] Patient Account Number: [id number] Date of Birth/Sex: 12/18/1931 (83 y.o. Female) Treating RN: Ahmed Prima Primary Care Physician: Margarita Rana Other Clinician: Referring Physician: Margarita Rana Treating Physician/Extender: Frann Rider in Treatment: 5 Visit Information History Since Last Visit All ordered tests and consults were completed: No Patient Arrived: Ambulatory Added or deleted any medications: No Arrival Time: 09:56 Any new allergies or adverse reactions: No Accompanied By: self Had a fall or experienced change in No Transfer Assistance: None activities of daily living that may affect Patient Requires Transmission-Based No risk of falls: Precautions: Signs or symptoms of abuse/neglect since last No Patient Has Alerts: Yes visito Patient Alerts: DMII Hospitalized since last visit: No Pain Present Now: No Electronic Signature(s) Signed: 05/01/2015 8:19:40 AM By: Alric Quan Entered By: Alric Quan on 04/30/2015 09:58:06 Carrington, Clemens Catholic ([id number]) -------------------------------------------------------------------------------- Clinic Level of Care Assessment Details Patient Name: [name] Date of Service: 04/30/2015 10:00 AM Medical Record Number: [id number] Patient Account Number: [id number] Date of Birth/Sex: 23-Dec-1931 (83 y.o. Female) Treating RN: Carolyne Fiscal, Debi Primary Care Physician: Margarita Rana Other Clinician: Referring Physician: Margarita Rana Treating Physician/Extender: Frann Rider in Treatment: 5 Clinic Level of Care Assessment Items TOOL 4 Quantity Score X - Use when only an EandM is performed on FOLLOW-UP visit 1 0 ASSESSMENTS - Nursing Assessment / Reassessment []  - Reassessment of Co-morbidities (includes  updates in patient status) 0 X - Reassessment of Adherence to Treatment Plan 1 5 ASSESSMENTS - Wound and Skin Assessment / Reassessment X - Simple Wound Assessment / Reassessment - one wound 1 5 []  - Complex Wound Assessment / Reassessment - multiple wounds 0 []  - Dermatologic / Skin Assessment (not related to wound area) 0 ASSESSMENTS - Focused Assessment []  - Circumferential Edema Measurements - multi extremities 0 []  - Nutritional Assessment / Counseling / Intervention 0 []  - Lower Extremity Assessment (monofilament, tuning fork, pulses) 0 []  - Peripheral Arterial Disease Assessment (using hand held doppler) 0 ASSESSMENTS - Ostomy and/or Continence Assessment and Care []  - Incontinence Assessment and Management 0 []  - Ostomy Care Assessment and Management (repouching, etc.) 0 PROCESS - Coordination of Care X - Simple Patient / Family Education for ongoing care 1 15 []  - Complex (extensive) Patient / Family Education for ongoing care 0 []  - Staff obtains Programmer, systems, Records, Test Results / Process Orders 0 []  - Staff telephones HHA, Nursing Homes / Clarify orders / etc 0 []  - Routine Transfer to another Facility (non-emergent condition) 0 Cothern, Niomi R. ([id number]) []  - Routine Hospital Admission (non-emergent condition) 0 []  - New Admissions / Biomedical engineer / Ordering NPWT, Apligraf, etc. 0 []  - Emergency Hospital Admission (emergent condition) 0 []  - Simple Discharge Coordination 0 X - Complex (extensive) Discharge Coordination 1 15 PROCESS - Special Needs []  - Pediatric / Minor Patient Management 0 []  - Isolation Patient Management 0 []  - Hearing / Language / Visual special needs 0 []  - Assessment of Community assistance (transportation, D/C planning, etc.) 0 []  - Additional assistance / Altered mentation 0 []  - Support Surface(s) Assessment (bed, cushion, seat, etc.) 0 INTERVENTIONS - Wound Cleansing / Measurement X - Simple Wound Cleansing - one wound 1 5 []  -  Complex Wound Cleansing - multiple wounds 0 X - Wound Imaging (photographs - any number of wounds) 1 5 []  - Wound Tracing (instead of photographs) 0 []  -  Simple Wound Measurement - one wound 0 []  - Complex Wound Measurement - multiple wounds 0 INTERVENTIONS - Wound Dressings []  - Small Wound Dressing one or multiple wounds 0 []  - Medium Wound Dressing one or multiple wounds 0 []  - Large Wound Dressing one or multiple wounds 0 []  - Application of Medications - topical 0 []  - Application of Medications - injection 0 INTERVENTIONS - Miscellaneous []  - External ear exam 0 Westerfield, Blima R. ([id number]) []  - Specimen Collection (cultures, biopsies, blood, body fluids, etc.) 0 []  - Specimen(s) / Culture(s) sent or taken to Lab for analysis 0 []  - Patient Transfer (multiple staff / Harrel Lemon Lift / Similar devices) 0 []  - Simple Staple / Suture removal (25 or less) 0 []  - Complex Staple / Suture removal (26 or more) 0 []  - Hypo / Hyperglycemic Management (close monitor of Blood Glucose) 0 []  - Ankle / Brachial Index (ABI) - do not check if billed separately 0 X - Vital Signs 1 5 Has the patient been seen at the hospital within the last three years: Yes Total Score: 55 Level Of Care: New/Established - Level 2 Electronic Signature(s) Signed: 05/01/2015 8:19:40 AM By: Alric Quan Entered By: Alric Quan on 04/30/2015 10:16:09 [name] ([id number]) -------------------------------------------------------------------------------- Encounter Discharge Information Details Patient Name: [name] Date of Service: 04/30/2015 10:00 AM Medical Record Number: [id number] Patient Account Number: [id number] Date of Birth/Sex: 29-Jan-1932 (83 y.o. Female) Treating RN: Ahmed Prima Primary Care Physician: Margarita Rana Other Clinician: Referring Physician: Margarita Rana Treating Physician/Extender: Frann Rider in Treatment: 5 Encounter Discharge Information  Items Discharge Pain Level: 0 Discharge Condition: Stable Ambulatory Status: Ambulatory Discharge Destination: Home Transportation: Private Auto Accompanied By: self Schedule Follow-up Appointment: No Medication Reconciliation completed and provided to Patient/Care Yes Shawnna Pancake: Provided on Clinical Summary of Care: 04/30/2015 Form Type Recipient Paper Patient [facility] Electronic Signature(s) Signed: 04/30/2015 10:11:10 AM By: Ruthine Dose Entered By: Ruthine Dose on 04/30/2015 10:11:10 [name] ([id number]) -------------------------------------------------------------------------------- Lower Extremity Assessment Details Patient Name: [name] Date of Service: 04/30/2015 10:00 AM Medical Record Number: [id number] Patient Account Number: [id number] Date of Birth/Sex: 1931/07/28 (83 y.o. Female) Treating RN: Carolyne Fiscal, Debi Primary Care Physician: Margarita Rana Other Clinician: Referring Physician: Margarita Rana Treating Physician/Extender: Frann Rider in Treatment: 5 Vascular Assessment Pulses: Posterior Tibial Dorsalis Pedis Palpable: [Left:Yes] Extremity colors, hair growth, and conditions: Extremity Color: [Left:Mottled] Temperature of Extremity: [Left:Warm] Capillary Refill: [Left:< 3 seconds] Toe Nail Assessment Left: Right: Thick: No Discolored: No Deformed: No Improper Length and Hygiene: No Electronic Signature(s) Signed: 05/01/2015 8:19:40 AM By: Alric Quan Entered By: Alric Quan on 04/30/2015 10:01:04 [name] ([id number]) -------------------------------------------------------------------------------- Multi Wound Chart Details Patient Name: [name] Date of Service: 04/30/2015 10:00 AM Medical Record Number: [id number] Patient Account Number: [id number] Date of Birth/Sex: [birth year] (83 y.o. Female) Treating RN: Ahmed Prima Primary Care Physician: Margarita Rana Other Clinician: Referring  Physician: Margarita Rana Treating Physician/Extender: Frann Rider in Treatment: 5 Vital Signs Height(in): 61 Pulse(bpm): 63 Weight(lbs): 175 Blood Pressure 130/59 (mmHg): Body Mass Index(BMI): 33 Temperature(F): Respiratory Rate 20 (breaths/min): Photos: [1:No Photos] [N/A:N/A] Wound Location: [1:Left, Anterior Lower Leg] [N/A:N/A] Wounding Event: [1:Trauma] [N/A:N/A] Primary Etiology: [1:Trauma, Other] [N/A:N/A] Date Acquired: [1:02/28/2015] [N/A:N/A] Weeks of Treatment: [1:5] [N/A:N/A] Wound Status: [1:Healed - Epithelialized] [N/A:N/A] Measurements L x W x D 0x0x0 [N/A:N/A] (cm) Area (cm) : [1:0] [N/A:N/A] Volume (cm) : [1:0] [N/A:N/A] % Reduction in Area: [1:100.00%] [N/A:N/A] % Reduction in Volume: 100.00% [N/A:N/A] Classification: [1:Full  Thickness Without Exposed Support Structures] [N/A:N/A] Periwound Skin Texture: No Abnormalities Noted [N/A:N/A] Periwound Skin [1:No Abnormalities Noted] [N/A:N/A] Moisture: Periwound Skin Color: No Abnormalities Noted [N/A:N/A] Tenderness on [1:No] [N/A:N/A] Treatment Notes Electronic Signature(s) Signed: 05/01/2015 8:19:40 AM By: Alric Quan Entered By: Alric Quan on 04/30/2015 10:07:19 [name] ([id number]) NYKITA, MUZZY ([id number]) -------------------------------------------------------------------------------- [place] Details Patient Name: [name] Date of Service: 04/30/2015 10:00 AM Medical Record Number: [id number] Patient Account Number: [id number] Date of Birth/Sex: 11-Nov-1931 (83 y.o. Female) Treating RN: Carolyne Fiscal, Debi Primary Care Physician: Margarita Rana Other Clinician: Referring Physician: Margarita Rana Treating Physician/Extender: Frann Rider in Treatment: 5 Active Inactive Electronic Signature(s) Signed: 05/01/2015 8:19:40 AM By: Alric Quan Entered By: Alric Quan on 04/30/2015 10:07:12 MIHIKA, DONOGHUE  ([id number]) -------------------------------------------------------------------------------- Pain Assessment Details Patient Name: [name] Date of Service: 04/30/2015 10:00 AM Medical Record Number: [id number] Patient Account Number: [id number] Date of Birth/Sex: Jul 19, 1931 (83 y.o. Female) Treating RN: Ahmed Prima Primary Care Physician: Margarita Rana Other Clinician: Referring Physician: Margarita Rana Treating Physician/Extender: Frann Rider in Treatment: 5 Active Problems Location of Pain Severity and Description of Pain Patient Has Paino No Site Locations Pain Management and Medication Current Pain Management: Electronic Signature(s) Signed: 05/01/2015 8:19:40 AM By: Alric Quan Entered By: Alric Quan on 04/30/2015 09:58:24 Holzer, Clemens Catholic ([id number]) -------------------------------------------------------------------------------- Patient/Caregiver Education Details Patient Name: [name] Date of Service: 04/30/2015 10:00 AM Medical Record Number: [id number] Patient Account Number: [id number] Date of Birth/Gender: 1931-10-06 (83 y.o. Female) Treating RN: Carolyne Fiscal, Debi Primary Care Physician: Margarita Rana Other Clinician: Referring Physician: Margarita Rana Treating Physician/Extender: Frann Rider in Treatment: 5 Education Assessment Education Provided To: Patient Education Topics Provided Wound/Skin Impairment: Handouts: Other: wear your compression stockings daily and remove at night Methods: Demonstration, Explain/Verbal Responses: State content correctly Electronic Signature(s) Signed: 05/01/2015 8:19:40 AM By: Alric Quan Entered By: Alric Quan on 04/30/2015 10:08:59 Pelot, Clemens Catholic ([id number]) -------------------------------------------------------------------------------- Wound Assessment Details Patient Name: [name] Date of Service: 04/30/2015 10:00 AM Medical Record Number:  [id number] Patient Account Number: [id number] Date of Birth/Sex: 12-Oct-1931 (83 y.o. Female) Treating RN: Carolyne Fiscal, Debi Primary Care Physician: Margarita Rana Other Clinician: Referring Physician: Margarita Rana Treating Physician/Extender: Frann Rider in Treatment: 5 Wound Status Wound Number: 1 Primary Etiology: Trauma, Other Wound Location: Left, Anterior Lower Leg Wound Status: Healed - Epithelialized Wounding Event: Trauma Date Acquired: 02/28/2015 Weeks Of Treatment: 5 Clustered Wound: No Photos Photo Uploaded By: Alric Quan on 04/30/2015 17:30:05 Wound Measurements Length: (cm) 0 % Reducti Width: (cm) 0 % Reducti Depth: (cm) 0 Area: (cm) 0 Volume: (cm) 0 on in Area: 100% on in Volume: 100% Wound Description Full Thickness Without Exposed Classification: Support Structures Periwound Skin Texture Texture Color No Abnormalities Noted: No No Abnormalities Noted: No Moisture No Abnormalities Noted: No Electronic Signature(s) Signed: 05/01/2015 8:19:40 AM By: Levin Bacon, Clemens Catholic ([id number]) Entered By: Alric Quan on 04/30/2015 10:06:48 [name] ([id number]) -------------------------------------------------------------------------------- Vitals Details Patient Name: [name] Date of Service: 04/30/2015 10:00 AM Medical Record Number: [id number] Patient Account Number: [id number] Date of Birth/Sex: Feb 10, 1932 (83 y.o. Female) Treating RN: Carolyne Fiscal, Debi Primary Care Physician: Margarita Rana Other Clinician: Referring Physician: Margarita Rana Treating Physician/Extender: Frann Rider in Treatment: 5 Vital Signs Time Taken: 09:58 Pulse (bpm): 63 Height (in): 61 Respiratory Rate (breaths/min): 20 Weight (lbs): 175 Blood Pressure (mmHg): 130/59 Body Mass Index (BMI): 33.1 Reference Range: 80 - 120 mg / dl Electronic Signature(s) Signed: 05/01/2015 8:19:40  AM By: Alric Quan Entered By:  Alric Quan on 04/30/2015 10:00:06

## 2015-05-01 NOTE — Progress Notes (Signed)
[name], [name] ([id number]) Visit Report for 04/30/2015 Chief Complaint Document Details Patient Name: [name], [name] 04/30/2015 10:00 Date of Service: AM Medical Record [id number] Number: Patient Account Number: [id number] Jul 26, 1931 (82 y.o. Treating RN: Ahmed Prima Date of Birth/Sex: Female) Other Clinician: Primary Care Physician: Margarita Rana Treating Christin Fudge Referring Physician: Margarita Rana Physician/Extender: Suella Grove in Treatment: 5 Information Obtained from: Patient Chief Complaint Patient presents to the wound care center for a consult due non healing wound to the left lower extremity which she's had for about 3 weeks now Electronic Signature(s) Signed: 04/30/2015 10:12:02 AM By: Christin Fudge MD, FACS Entered By: Christin Fudge on 04/30/2015 10:12:02 [name] ([id number]) -------------------------------------------------------------------------------- HPI Details Patient Name: [name]. 04/30/2015 10:00 Date of Service: AM Medical Record [id number] Number: Patient Account Number: [id number] 12-May-1931 (82 y.o. Treating RN: Ahmed Prima Date of Birth/Sex: Female) Other Clinician: Primary Care Physician: Margarita Rana Treating Christin Fudge Referring Physician: Margarita Rana Physician/Extender: Suella Grove in Treatment: 5 History of Present Illness Location: lacerated wound to the left lower extremity Quality: Patient reports experiencing a dull pain to affected area(s). Severity: Patient states wound (s) are getting better. Duration: Patient has had the wound for < 3 weeks prior to presenting for treatment Timing: Pain in wound is Intermittent (comes and goes Context: The wound occurred when the patient had a blunt injury which caused a laceration to the left lower extremity Modifying Factors: Other treatment(s) tried include:had sutures placed in the ER and then had an infected wound which was treated with doxycycline Associated  Signs and Symptoms: Patient reports having increase swelling. HPI Description: 82 year old patient who had an injury to her left lower extremity on December 21 and went to the ED and had sutures and Steri-Strips applied and tetanus shot given but no antibiotics. She was then seen by her PCP on 03/13/2015 and at that time was noted to have atelectasis of the left lower extremity and hence was put on doxycycline 100 mg twice a day for 7 days. The sutures were removed and the wound was found to be gaping. past medical is trace significant for diabetes mellitus, arthritis, basal cell carcinoma, collagenous colitis, essential hypertension, avitaminosis D. she is also status post abdominal hysterectomy, lumbar discectomy, neck surgery. She is a former smoker and quit in 1979. Of note in the recent past she was seen by Dr. Lucky Cowboy for what seems a sounds like injections of varicose veins. This was only around her ankles and not in the lower extremities. No Doppler studies were done. She was not advised to wear any compression stockings. 04/03/2015 -- she has received the compression stockings but is unable to wear them as she does not have the ability to don them. 04/09/2015 -- with a little bit of perseverance she has been able to use her compression stockings and has been doing this regularly. She is going to be away for 2 weeks visiting her family. Electronic Signature(s) Signed: 04/30/2015 10:12:29 AM By: Christin Fudge MD, FACS Entered By: Christin Fudge on 04/30/2015 10:12:28 [name], [name] ([id number]) -------------------------------------------------------------------------------- Physical Exam Details Patient Name: [name], [name] 04/30/2015 10:00 Date of Service: AM Medical Record [id number] Number: Patient Account Number: [id number] [birth year] (82 y.o. Treating RN: Ahmed Prima Date of Birth/Sex: Female) Other Clinician: Primary Care Physician: Margarita Rana Treating Christin Fudge Referring Physician: Margarita Rana Physician/Extender: Suella Grove in Treatment: 5 Constitutional . Pulse regular. Respirations normal and unlabored. Afebrile. . Eyes Nonicteric. Reactive to light. Ears, Nose, Mouth, and Throat Lips, teeth,  and gums WNL.Marland Kitchen Moist mucosa without lesions. Neck supple and nontender. No palpable supraclavicular or cervical adenopathy. Normal sized without goiter. Respiratory WNL. No retractions.. Cardiovascular Pedal Pulses WNL. No clubbing, cyanosis or edema. Chest Breasts symmetical and no nipple discharge.. Breast tissue WNL, no masses, lumps, or tenderness.. Lymphatic No adneopathy. No adenopathy. No adenopathy. Musculoskeletal Adexa without tenderness or enlargement.. Digits and nails w/o clubbing, cyanosis, infection, petechiae, ischemia, or inflammatory conditions.. Integumentary (Hair, Skin) No suspicious lesions. No crepitus or fluctuance. No peri-wound warmth or erythema. No masses.Marland Kitchen Psychiatric Judgement and insight Intact.. No evidence of depression, anxiety, or agitation.. Notes the wound is completely healed and there is no surrounding edema or cellulitis. Electronic Signature(s) Signed: 04/30/2015 10:13:03 AM By: Christin Fudge MD, FACS Entered By: Christin Fudge on 04/30/2015 10:13:03 [name], [name] ([id number]) -------------------------------------------------------------------------------- Physician Orders Details Patient Name: [name], [name] 04/30/2015 10:00 Date of Service: AM Medical Record [id number] Number: Patient Account Number: [id number] 09/10/1931 (82 y.o. Treating RN: Ahmed Prima Date of Birth/Sex: Female) Other Clinician: Primary Care Physician: Margarita Rana Treating Christin Fudge Referring Physician: Margarita Rana Physician/Extender: Suella Grove in Treatment: 5 Verbal / Phone Orders: Yes Clinician: Carolyne Fiscal, Debi Read Back and Verified: Yes Diagnosis Coding Discharge From [facility] Services o Discharge  from [place] - wear your compression stockings daily and remove at night wear moisturizer at night Electronic Signature(s) Signed: 04/30/2015 2:58:24 PM By: Christin Fudge MD, FACS Signed: 05/01/2015 8:19:40 AM By: Alric Quan Entered By: Alric Quan on 04/30/2015 10:08:18 [name], [name] ([id number]) -------------------------------------------------------------------------------- Problem List Details Patient Name: [name], [name] 04/30/2015 10:00 Date of Service: AM Medical Record [id number] Number: Patient Account Number: [id number] 1932-01-18 (82 y.o. 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 04/30/2015 10:00 Date of Service: AM Medical Record [id number] Number: Patient Account Number: [id number] 09-06-1931 (82 y.o. Treating RN: Ahmed Prima Date of Birth/Sex: Female) Other Clinician: Primary Care Physician: Margarita Rana Treating Christin Fudge Referring Physician: Margarita Rana Physician/Extender: Suella Grove in Treatment:  5 Subjective Chief Complaint Information obtained from Patient Patient presents to the wound care center for a consult due non healing wound to the left lower extremity which she's had for about 3 weeks now History of Present Illness (HPI) The following HPI elements were documented for the patient's wound: Location: lacerated wound to the left lower extremity Quality: Patient reports experiencing a dull pain to affected area(s). Severity: Patient states wound (s) are getting better. Duration: Patient has had the wound for < 3 weeks prior to presenting for treatment Timing: Pain in wound is Intermittent (comes and goes Context: The wound occurred when the patient had a blunt injury which caused a laceration to the left lower extremity Modifying Factors: Other treatment(s) tried include:had sutures placed in the ER and then had an infected wound which was treated with doxycycline Associated Signs and Symptoms: Patient reports having  increase swelling. 82 year old patient who had an injury to her left lower extremity on December 21 and went to the ED and had sutures and Steri-Strips applied and tetanus shot given but no antibiotics. She was then seen by her PCP on 03/13/2015 and at that time was noted to have atelectasis of the left lower extremity and hence was put on doxycycline 100 mg twice a day for 7 days. The sutures were removed and the wound was found to be gaping. past medical is trace significant for diabetes mellitus, arthritis, basal cell carcinoma, collagenous colitis, essential hypertension, avitaminosis D. she is also status post abdominal hysterectomy, lumbar discectomy, neck surgery. She is a former smoker and quit in 1979. Of note in the recent past she was seen by Dr. Lucky Cowboy for what seems a sounds like injections of varicose veins. This was only around her ankles and not in the lower extremities. No Doppler studies were done. She was not advised to wear any compression  stockings. 04/03/2015 -- she has received the compression stockings but is unable to wear them as she does not have the ability to don them. 04/09/2015 -- with a little bit of perseverance she has been able to use her compression stockings and has been doing this regularly. She is going to be away for 2 weeks visiting her family. [name], [name] ([id number]) Objective Constitutional Pulse regular. Respirations normal and unlabored. Afebrile. Vitals Time Taken: 9:58 AM, Height: 61 in, Weight: 175 lbs, BMI: 33.1, Pulse: 63 bpm, Respiratory Rate: 20 breaths/min, Blood Pressure: 130/59 mmHg. Eyes Nonicteric. Reactive to light. Ears, Nose, Mouth, and Throat Lips, teeth, and gums WNL.Marland Kitchen Moist mucosa without lesions. Neck supple and nontender. No palpable supraclavicular or cervical adenopathy. Normal sized without goiter. Respiratory WNL. No retractions.. Cardiovascular Pedal Pulses WNL. No clubbing, cyanosis or edema. Chest Breasts symmetical and no nipple discharge.. Breast tissue WNL, no masses, lumps, or tenderness.. Lymphatic No adneopathy. No adenopathy. No adenopathy. Musculoskeletal Adexa without tenderness or enlargement.. Digits and nails w/o clubbing, cyanosis, infection, petechiae, ischemia, or inflammatory conditions.Marland Kitchen Psychiatric Judgement and insight Intact.. No evidence of depression, anxiety, or agitation.. General Notes: the wound is completely healed and there is no surrounding edema or cellulitis. Integumentary (Hair, Skin) No suspicious lesions. No crepitus or fluctuance. No peri-wound warmth or erythema. No masses.. Wound #1 status is Healed - Epithelialized. Original cause of wound was Trauma. The wound is located on the Left,Anterior Lower Leg. The wound measures 0cm length x 0cm width x 0cm depth; 0cm^2 area and 0cm^3 volume. [name], [name] ([id number]) Assessment Active Problems ICD-10 E11.622 - Type 2 diabetes mellitus with other skin ulcer S81.812A -  Laceration without foreign body, left lower leg, initial encounter L97.222 - Non-pressure chronic ulcer of left calf with fat layer exposed I87.312 - Chronic venous hypertension (idiopathic) with ulcer of left lower extremity Plan Discharge From [facility] Services: Discharge from [place] - wear your compression stockings daily and remove at night wear moisturizer at night Having completely healed the wound I have discharged her from the wound care services and I have asked her to continue wearing her compression stockings as she will protect further issues especially with the edema she's been having. She will come back and see as as needed Electronic Signature(s) Signed: 04/30/2015 10:14:02 AM By: Christin Fudge MD, FACS Entered By: Christin Fudge on 04/30/2015 10:14:01 [name], [name] ([id number]) -------------------------------------------------------------------------------- SuperBill Details Patient Name: [name] Date of Service: 04/30/2015 Medical Record Number: [id number] Patient Account Number:  [id number] Date of Birth/Sex: 11-10-1931 (82 y.o. Female) Treating RN: Carolyne Fiscal, Debi Primary Care Physician: Margarita Rana Other Clinician: Referring Physician: Margarita Rana Treating Physician/Extender: Frann Rider in Treatment: 5 Diagnosis Coding ICD-10 Codes Code Description E11.622 Type 2 diabetes mellitus with other skin ulcer S81.812A Laceration without foreign body, left lower leg, initial encounter L97.222 Non-pressure chronic ulcer of left calf with fat layer exposed I87.312 Chronic venous hypertension (idiopathic) with ulcer of left lower extremity Facility Procedures CPT4 Code: [id number] Description: [phone] - WOUND CARE VISIT-LEV 2 EST PT Modifier: Quantity: 1 Physician Procedures CPT4 Code Description: [id number] - WC PHYS LEVEL 2 - EST PT ICD-10 Description Diagnosis E11.622 Type 2 diabetes mellitus with other skin ulcer S81.812A Laceration  without foreign body, left lower leg, initi L97.222 Non-pressure chronic ulcer of left  calf with fat layer I87.312 Chronic venous hypertension (idiopathic) with ulcer of Modifier: al encounter exposed left lower Quantity: 1 extremity Electronic Signature(s) Signed: 04/30/2015 2:58:24 PM By: Christin Fudge MD, FACS Signed: 05/01/2015 8:19:40 AM By: Alric Quan Previous Signature: 04/30/2015 10:14:17 AM Version By: Christin Fudge MD, FACS Entered By: Alric Quan on 04/30/2015 10:16:18

## 2015-05-08 DIAGNOSIS — Z1151 Encounter for screening for human papillomavirus (HPV): Secondary | ICD-10-CM | POA: Diagnosis not present

## 2015-05-08 DIAGNOSIS — Z1231 Encounter for screening mammogram for malignant neoplasm of breast: Secondary | ICD-10-CM | POA: Diagnosis not present

## 2015-05-08 DIAGNOSIS — Z01419 Encounter for gynecological examination (general) (routine) without abnormal findings: Secondary | ICD-10-CM | POA: Diagnosis not present

## 2015-05-12 ENCOUNTER — Other Ambulatory Visit: Payer: Self-pay | Admitting: Family Medicine

## 2015-05-12 DIAGNOSIS — E7800 Pure hypercholesterolemia, unspecified: Secondary | ICD-10-CM

## 2015-05-14 ENCOUNTER — Ambulatory Visit
Admission: RE | Admit: 2015-05-14 | Discharge: 2015-05-14 | Disposition: A | Discharge status: Home / Self Care | Payer: Medicare Other | Source: Ambulatory Visit | Attending: Family Medicine | Admitting: Family Medicine

## 2015-05-14 ENCOUNTER — Telehealth: Payer: Self-pay

## 2015-05-14 ENCOUNTER — Encounter: Payer: Self-pay | Admitting: Family Medicine

## 2015-05-14 ENCOUNTER — Ambulatory Visit (INDEPENDENT_AMBULATORY_CARE_PROVIDER_SITE_OTHER): Payer: Medicare Other | Admitting: Family Medicine

## 2015-05-14 ENCOUNTER — Ambulatory Visit
Admission: RE | Admit: 2015-05-14 | Discharge: 2015-05-14 | Disposition: A | Discharge status: Home / Self Care | Payer: Medicare Other | Source: Ambulatory Visit | Attending: General Practice | Admitting: General Practice

## 2015-05-14 VITALS — BP 122/60 | Temp 97.9°F | Resp 16 | Wt 182.0 lb

## 2015-05-14 DIAGNOSIS — E119 Type 2 diabetes mellitus without complications: Secondary | ICD-10-CM

## 2015-05-14 DIAGNOSIS — R0989 Other specified symptoms and signs involving the circulatory and respiratory systems: Secondary | ICD-10-CM

## 2015-05-14 DIAGNOSIS — E7800 Pure hypercholesterolemia, unspecified: Secondary | ICD-10-CM | POA: Diagnosis not present

## 2015-05-14 DIAGNOSIS — I10 Essential (primary) hypertension: Secondary | ICD-10-CM | POA: Diagnosis not present

## 2015-05-14 LAB — POCT GLYCOSYLATED HEMOGLOBIN (HGB A1C)
Est. average glucose Bld gHb Est-mCnc: 203
Hemoglobin A1C: 8.7

## 2015-05-14 NOTE — Telephone Encounter (Signed)
Informed pt as below. Emily Drozdowski, CMA  

## 2015-05-14 NOTE — Telephone Encounter (Signed)
-----   Message from Margarita Rana, MD sent at 05/14/2015  9:58 AM EST ----- Normal Chest Xray. Please notify patient. Thanks.

## 2015-05-14 NOTE — Progress Notes (Signed)
Patient ID: [name], female   DOB: 07-15-1931, 82 y.o.   MRN: [id number]       [name]    DOB: 1931/10/05   82 y.o.   MRN: [id number] Visit Date: 05/14/2015  Today's Provider: Margarita Rana, MD   Chief Complaint  Patient presents with  . Diabetes  . Hypertension  . Hyperlipidemia   Subjective:    HPI  Diabetes Mellitus Type II, Follow-up:   Lab Results  Component Value Date   HGBA1C 8.7 05/14/2015   HGBA1C 8.6 01/01/2015   HGBA1C 7.5 08/15/2014    Last seen for diabetes 4 months ago.  Management since then includes no changes. She reports good compliance with treatment. She is not having side effects.  Episodes of hypoglycemia? no   Current Insulin Regimen: none Most Recent Eye Exam: less than 1 year.  Weight trend: fluctuating a bit Prior visit with dietician: no Current diet: well balanced Current exercise: none  Pertinent Labs:    Component Value Date/Time   CHOL 180 04/19/2014   TRIG 141 04/19/2014   HDL 62 04/19/2014   LDLCALC 90 04/19/2014   CREATININE 1.24* 07/19/2014 1158   CREATININE 1.0 04/19/2014    Wt Readings from Last 3 Encounters:  05/14/15 182 lb (82.555 kg)  02/28/15 170 lb (77.111 kg)  01/15/15 183 lb (83.008 kg)     Hypertension, follow-up:  BP Readings from Last 3 Encounters:  05/14/15 122/60  03/16/15 114/66  03/13/15 138/62    She was last seen for hypertension 4 months ago.  BP at that visit was 114/66. Management since that visit includes D/C Atenolol. She reports good compliance with treatment. She is not having side effects.  She is not exercising. She is adherent to low salt diet.   Outside blood pressures are checked occasionally. Patient denies chest pain, fatigue, irregular heart beat, palpitations and tachypnea.    Wt Readings from Last 3 Encounters:  05/14/15 182 lb (82.555 kg)  02/28/15 170 lb (77.111 kg)  01/15/15 183 lb (83.008 kg)    Current diet: well  balanced   Lipid/Cholesterol, Follow-up:   Last seen for this4 months ago.  Management changes since that visit include no changes. . Last Lipid Panel:    Component Value Date/Time   CHOL 180 04/19/2014   TRIG 141 04/19/2014   HDL 62 04/19/2014   [place] 90 04/19/2014    Risk factors for vascular disease include diabetes mellitus and hypertension  She reports good compliance with treatment. She is not having side effects.  Current symptoms include none and have been stable. Weight trend: fluctuating a bit Prior visit with dietician: no Current diet: well balanced Current exercise: none  Wt Readings from Last 3 Encounters:  05/14/15 182 lb (82.555 kg)  02/28/15 170 lb (77.111 kg)  01/15/15 183 lb (83.008 kg)             Allergies  Allergen Reactions  . Morphine Sulfate   . Penicillins Diarrhea   Previous Medications   ASPIRIN 81 MG TABLET    Take by mouth.   ATENOLOL (TENORMIN) 25 MG TABLET    1 TABLET TABLET, ORAL, DAILY   ATENOLOL (TENORMIN) 25 MG TABLET    1 TABLET TABLET, ORAL, DAILY   BLOOD GLUCOSE MONITORING SUPPL (ACCU-CHEK AVIVA CONNECT) W/DEVICE KIT    ACCU-CHEK AVIVA (In Vitro Strip)  1 (one) Strip Strip daily for 0 days  Quantity: 100;  Refills: 3   Ordered :24-Oct-2013  Dimas,  Madison Hickman ;  Started 24-Oct-2013 Active Comments: Dx: 250.00 also include Accu-chek aviva meter   BUDESONIDE (ENTOCORT EC) 3 MG 24 HR CAPSULE    TAKE 3  CAPSULES  ORAL, DAILY   CALCIUM-VITAMIN D 600-200 MG-UNIT PER TABLET    Take by mouth.   CHOLECALCIFEROL (VITAMIN D) 2000 UNITS CAPS    Take by mouth.   DOXYCYCLINE (VIBRA-TABS) 100 MG TABLET    Take 1 tablet (100 mg total) by mouth 2 (two) times daily.   GLUCOSE BLOOD TEST STRIP    BAYER CONTOUR TEST (In Vitro Strip)  1 (one) Strip Strip to check blood sugar daily for 0 days  Quantity: 50;  Refills: 5   Ordered :20-May-2013  Gerald Leitz ;  Started 20-May-2013 Active Comments: & Lancets   LISINOPRIL-HYDROCHLOROTHIAZIDE  (PRINZIDE,ZESTORETIC) 10-12.5 MG TABLET    1 (ONE) TABLET, ORAL, DAILY   METFORMIN (GLUCOPHAGE) 500 MG TABLET    TAKE 1 TABLET, ORAL, TWO TIMES DAILY   MULTIPLE VITAMIN TABLET    Take by mouth. Reported on 03/13/2015   OMEGA-3 FATTY ACIDS PO    Take by mouth.   SIMVASTATIN (ZOCOR) 20 MG TABLET    Take by mouth.    Review of Systems  Constitutional: Negative.   Respiratory: Negative.   Cardiovascular: Negative.   Endocrine: Negative.   Musculoskeletal: Negative.   Neurological: Negative.     Social History  Substance Use Topics  . Smoking status: Former Smoker    Quit date: 03/09/1978  . Smokeless tobacco: Never Used  . Alcohol Use: Yes     Comment: occasional   Objective:   BP 122/60 mmHg  Temp(Src) 97.9 F (36.6 C)  Resp 16  Wt 182 lb (82.555 kg)  Physical Exam  Constitutional: She is oriented to person, place, and time. She appears well-developed and well-nourished.  Cardiovascular: Normal rate, regular rhythm and normal heart sounds.   Pulmonary/Chest: Effort normal.  Trace crackles at the base of her lungs.   Neurological: She is alert and oriented to person, place, and time.  Psychiatric: She has a normal mood and affect. Her behavior is normal. Judgment and thought content normal.        Assessment & Plan:     1. Essential (primary) hypertension Stable. Will discontinue Atenolol. Continue all other meds. Will follow up in 3 months.   2. Type 2 diabetes mellitus without complication, without long-term current use of insulin ([place]) Not to goal. Patient reports that she will try to work on diet and exercise. Will follow up in 3 months.   - POCT glycosylated hemoglobin (Hb A1C)  3. Hypercholesteremia Stable.   4. Abnormal lung sounds Will check chest xray. F/U pending report.  - DG Chest 2 View; Future      Patient was seen and examined by Jerrell Belfast, MD, and scribed by Wilburt Finlay, [place].  I have reviewed the document for accuracy and completeness  and I agree with above. - Jerrell Belfast, MD  Margarita Rana, MD  [facility]

## 2015-05-24 ENCOUNTER — Ambulatory Visit (INDEPENDENT_AMBULATORY_CARE_PROVIDER_SITE_OTHER): Payer: Medicare Other | Admitting: Podiatry

## 2015-05-24 ENCOUNTER — Ambulatory Visit (INDEPENDENT_AMBULATORY_CARE_PROVIDER_SITE_OTHER): Payer: Medicare Other

## 2015-05-24 ENCOUNTER — Encounter: Payer: Self-pay | Admitting: Podiatry

## 2015-05-24 VITALS — BP 152/75 | HR 79 | Resp 18

## 2015-05-24 DIAGNOSIS — R52 Pain, unspecified: Secondary | ICD-10-CM | POA: Diagnosis not present

## 2015-05-24 DIAGNOSIS — L84 Corns and callosities: Secondary | ICD-10-CM

## 2015-05-24 DIAGNOSIS — M2040 Other hammer toe(s) (acquired), unspecified foot: Secondary | ICD-10-CM | POA: Diagnosis not present

## 2015-05-24 DIAGNOSIS — M79676 Pain in unspecified toe(s): Secondary | ICD-10-CM

## 2015-05-24 DIAGNOSIS — B351 Tinea unguium: Secondary | ICD-10-CM | POA: Diagnosis not present

## 2015-05-24 NOTE — Progress Notes (Signed)
   Subjective:    Patient ID: [name], female    DOB: Oct 13, 1931, 82 y.o.   MRN: [id number]  HPI  82 year old female presents the also concerns of a corn on the left fifth toe as well as the right second toe at the tip of the toe and her toenail curving inwards. She states the area very painful with pressure in shoe. She denies any drainage or redness or any swelling. No recent injury. No recent treatment. No other complaints.    Review of Systems  All other systems reviewed and are negative.      Objective:   Physical Exam General: AAO x3, NAD  Dermatological: Hyperkeratotic lesions present at the lateral aspect of the left fifth toe of the PIPJ into the distal aspect the right second toe. Upon debridement no underlying ulceration, drainage or other signs of infection. The right second digit toenail does appear to be somewhat dystrophic, discolored and hypertrophic. There is tenderness of the nail. No surrounding redness or drainage.  Vascular: Dorsalis Pedis artery and Posterior Tibial artery pedal pulses are 2/4 bilateral with immedate capillary fill time. Pedal hair growth present. No varicosities and no lower extremity edema present bilateral. There is no pain with calf compression, swelling, warmth, erythema.   Neruologic: Grossly intact via light touch bilateral. Vibratory intact via tuning fork bilateral. Protective threshold with Semmes Wienstein monofilament intact to all pedal sites bilateral. Patellar and Achilles deep tendon reflexes 2+ bilateral. No Babinski or clonus noted bilateral.   Musculoskeletal: Hammertoe contractures are present. No pain, crepitus, or limitation noted with foot and ankle range of motion bilateral. Muscular strength 5/5 in all groups tested bilateral.  Gait: Unassisted, Nonantalgic.      Assessment & Plan:  82 year old female symptomatic hyperkeratotic lesions, right second toenail; hammertoe -Treatment options discussed including all  alternatives, risks, and complications -X-rays were obtained and reviewed with the patient.  -Etiology of symptoms were discussed -Right second digit nails debrided without consultation or bleeding -Hyperkeratotic lesions debrided 2 without complications or bleeding -Offloading pads dispensed -Follow-up with symptoms recur or worsen.  Celesta Gentile, DPM

## 2015-06-12 ENCOUNTER — Encounter: Payer: Self-pay | Admitting: Family Medicine

## 2015-07-01 ENCOUNTER — Other Ambulatory Visit: Payer: Self-pay | Admitting: Gastroenterology

## 2015-07-02 ENCOUNTER — Other Ambulatory Visit: Payer: Self-pay | Admitting: Family Medicine

## 2015-07-02 DIAGNOSIS — E119 Type 2 diabetes mellitus without complications: Secondary | ICD-10-CM

## 2015-07-08 ENCOUNTER — Other Ambulatory Visit: Payer: Self-pay | Admitting: Gastroenterology

## 2015-07-23 ENCOUNTER — Encounter: Payer: Medicare Other | Attending: Surgery | Admitting: Surgery

## 2015-07-23 DIAGNOSIS — X58XXXA Exposure to other specified factors, initial encounter: Secondary | ICD-10-CM | POA: Insufficient documentation

## 2015-07-23 DIAGNOSIS — M1990 Unspecified osteoarthritis, unspecified site: Secondary | ICD-10-CM | POA: Insufficient documentation

## 2015-07-23 DIAGNOSIS — I10 Essential (primary) hypertension: Secondary | ICD-10-CM | POA: Diagnosis not present

## 2015-07-23 DIAGNOSIS — Z87891 Personal history of nicotine dependence: Secondary | ICD-10-CM | POA: Diagnosis not present

## 2015-07-23 DIAGNOSIS — S81812A Laceration without foreign body, left lower leg, initial encounter: Secondary | ICD-10-CM | POA: Insufficient documentation

## 2015-07-23 DIAGNOSIS — L97222 Non-pressure chronic ulcer of left calf with fat layer exposed: Secondary | ICD-10-CM | POA: Insufficient documentation

## 2015-07-23 DIAGNOSIS — E11622 Type 2 diabetes mellitus with other skin ulcer: Secondary | ICD-10-CM | POA: Insufficient documentation

## 2015-07-23 DIAGNOSIS — I87312 Chronic venous hypertension (idiopathic) with ulcer of left lower extremity: Secondary | ICD-10-CM | POA: Insufficient documentation

## 2015-07-23 DIAGNOSIS — S81802A Unspecified open wound, left lower leg, initial encounter: Secondary | ICD-10-CM | POA: Diagnosis not present

## 2015-07-23 NOTE — Progress Notes (Signed)
GRASIELA, HUSSONG ([id number]) Visit Report for 07/23/2015 Chief Complaint Document Details Patient Name: [name], [name] Date of Service: 07/23/2015 2:15 PM Medical Record Number: [id number] Patient Account Number: [id number] Date of Birth/Sex: 30-Jan-1932 (82 y.o. Female) Treating RN: Macarthur Critchley Primary Care Physician: Margarita Rana Other Clinician: Referring Physician: Margarita Rana Treating Physician/Extender: Frann Rider in Treatment: 0 Information Obtained from: Patient Chief Complaint Patient presents to the wound care center for a consult due non healing wound to the left lower extremity which she's had for about 3 weeks now Electronic Signature(s) Signed: 07/23/2015 3:13:34 PM By: Christin Fudge MD, FACS Entered By: Christin Fudge on 07/23/2015 15:13:34 [name] ([id number]) -------------------------------------------------------------------------------- Debridement Details Patient Name: [name] Date of Service: 07/23/2015 2:15 PM Medical Record Number: [id number] Patient Account Number: [id number] Date of Birth/Sex: [birth year] (82 y.o. Female) Treating RN: Macarthur Critchley Primary Care Physician: Margarita Rana Other Clinician: Referring Physician: Margarita Rana Treating Physician/Extender: Frann Rider in Treatment: 0 Debridement Performed for Wound #2 Left Lower Leg Assessment: Performed By: Physician Christin Fudge, MD Debridement: Debridement Pre-procedure Yes Verification/Time Out Taken: Start Time: 14:55 Pain Control: Lidocaine 4% Topical Solution Level: Skin/Subcutaneous Tissue Total Area Debrided (L x 1.7 (cm) x 0.5 (cm) = 0.85 (cm) W): Tissue and other Viable, Non-Viable, Eschar, Exudate, Fibrin/Slough, Subcutaneous material debrided: Instrument: Forceps Bleeding: Minimum Hemostasis Achieved: Pressure End Time: 14:57 Procedural Pain: 0 Post Procedural Pain: 1 Response to Treatment: Procedure was tolerated  well Post Debridement Measurements of Total Wound Length: (cm) 1.7 Width: (cm) 0.5 Depth: (cm) 0.2 Volume: (cm) 0.134 Post Procedure Diagnosis Same as Pre-procedure Electronic Signature(s) Signed: 07/23/2015 3:13:18 PM By: Christin Fudge MD, FACS Signed: 07/23/2015 3:26:59 PM By: Rebecca Eaton RN, Sendra Entered By: Christin Fudge on 07/23/2015 15:13:18 Ludvigsen, Clemens Catholic ([id number]) -------------------------------------------------------------------------------- HPI Details Patient Name: [name] Date of Service: 07/23/2015 2:15 PM Medical Record Number: [id number] Patient Account Number: [id number] Date of Birth/Sex: 03-Apr-1931 (82 y.o. Female) Treating RN: Macarthur Critchley Primary Care Physician: Margarita Rana Other Clinician: Referring Physician: Margarita Rana Treating Physician/Extender: Frann Rider in Treatment: 0 History of Present Illness Location: lacerated wound to the left lower extremity Quality: Patient reports experiencing a dull pain to affected area(s). Severity: Patient states wound (s) are getting better. Duration: Patient has had the wound for < 3 weeks prior to presenting for treatment Timing: Pain in wound is Intermittent (comes and goes Context: The wound occurred when the patient had a blunt injury which caused a laceration to the left lower extremity Modifying Factors: Other treatment(s) tried include:as only applied a bandage to her leg Associated Signs and Symptoms: Patient reports having increase swelling. HPI Description: 82 year old patient who had an injury to her left lower extremity earlier this year and had been treated by me now comes back with a new injury to her left lower extremity which she's had for about 3 weeks now. Her past medical history is significant for diabetes mellitus, arthritis, basal cell carcinoma, collagenous colitis, essential hypertension, avitaminosis D. She is also status post abdominal hysterectomy,  lumbar discectomy, neck surgery. She is a former smoker and quit in 1979. Of note in the recent past she was seen by Dr. Lucky Cowboy for what seems a sounds like injections of varicose veins. This was only around her ankles and not in the lower extremities. No Doppler studies were done. She was not advised to wear any compression stockings. This time around she hit the door of her car and has a significant hematoma which  has gone down with time. She did not have any drainage from the wound. Electronic Signature(s) Signed: 07/23/2015 3:18:05 PM By: Christin Fudge MD, FACS Entered By: Christin Fudge on 07/23/2015 15:18:05 DONZA, OLTMAN ([id number]) -------------------------------------------------------------------------------- Physical Exam Details Patient Name: [name] Date of Service: 07/23/2015 2:15 PM Medical Record Number: [id number] Patient Account Number: [id number] Date of Birth/Sex: 06/03/1931 (82 y.o. Female) Treating RN: Macarthur Critchley Primary Care Physician: Margarita Rana Other Clinician: Referring Physician: Margarita Rana Treating Physician/Extender: Frann Rider in Treatment: 0 Constitutional . Pulse regular. Respirations normal and unlabored. Afebrile. . Eyes Nonicteric. Reactive to light. Ears, Nose, Mouth, and Throat Lips, teeth, and gums WNL.Marland Kitchen Moist mucosa without lesions. Neck supple and nontender. No palpable supraclavicular or cervical adenopathy. Normal sized without goiter. Respiratory WNL. No retractions.. Breath sounds WNL, No rubs, rales, rhonchi, or wheeze.. Cardiovascular Pedal Pulses WNL. her ABI done recently was 1.04. her bilateral lower extremity has stage I lymphedema. Chest Breasts symmetical and no nipple discharge.. Breast tissue WNL, no masses, lumps, or tenderness.. Gastrointestinal (GI) Abdomen without masses or tenderness.. No liver or spleen enlargement or tenderness.. Lymphatic No adneopathy. No adenopathy. No  adenopathy. Musculoskeletal Adexa without tenderness or enlargement.. Digits and nails w/o clubbing, cyanosis, infection, petechiae, ischemia, or inflammatory conditions.. Integumentary (Hair, Skin) No suspicious lesions. No crepitus or fluctuance. No peri-wound warmth or erythema. No masses.Marland Kitchen Psychiatric Judgement and insight Intact.. No evidence of depression, anxiety, or agitation.. Notes she has a small hematoma with some thick eschar which is hiding a ulcerated lesion down to the subcutaneous tissue. After appropriate moistening this and treating it with lidocaine ointment I was able to remove the eschar with mild amount of pain and there is still some debris which cannot be removed due to tenderness. I used a forcep to do this and bleeding was controlled with pressure Electronic Signature(s) Signed: 07/23/2015 3:19:53 PM By: Christin Fudge MD, FACS Entered By: Christin Fudge on 07/23/2015 15:19:52 JOHNINE, ARENSDORF ([id number]) ADELAIDE, ROBYN ([id number]) -------------------------------------------------------------------------------- Physician Orders Details Patient Name: [name] Date of Service: 07/23/2015 2:15 PM Medical Record Number: [id number] Patient Account Number: [id number] Date of Birth/Sex: 1931-09-27 (82 y.o. Female) Treating RN: Macarthur Critchley Primary Care Physician: Margarita Rana Other Clinician: Referring Physician: Margarita Rana Treating Physician/Extender: Frann Rider in Treatment: 0 Verbal / Phone Orders: Yes Clinician: Macarthur Critchley Read Back and Verified: Yes Diagnosis Coding Wound Cleansing Wound #2 Left Lower Leg o Cleanse wound with mild soap and water Primary Wound Dressing Wound #2 Left Lower Leg o Aquacel Ag Secondary Dressing Wound #2 Left Lower Leg o Boardered Foam Dressing Dressing Change Frequency Wound #2 Left Lower Leg o Change dressing every other day. Follow-up Appointments Wound #2 Left Lower  Leg o Return Appointment in 1 week. Edema Control Wound #2 Left Lower Leg o Support Garment 20-30 mm/Hg pressure to: Electronic Signature(s) Signed: 07/23/2015 3:26:59 PM By: Ardean Larsen Signed: 07/23/2015 3:35:28 PM By: Christin Fudge MD, FACS Entered By: Rebecca Eaton RN, Sendra on 07/23/2015 15:07:14 AZALIYAH, PATLAN ([id number]) -------------------------------------------------------------------------------- Problem List Details Patient Name: [name] Date of Service: 07/23/2015 2:15 PM Medical Record Number: [id number] Patient Account Number: [id number] Date of Birth/Sex: [birth year] (82 y.o. 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 Female) Treating RN: Macarthur Critchley Primary Care Physician: Margarita Rana Other Clinician: Referring Physician: Margarita Rana Treating Physician/Extender: Frann Rider in Treatment:  0 Subjective Chief Complaint Information obtained from Patient Patient presents to the wound care center for a consult due non healing wound to the left lower extremity which she's had for about 3 weeks now History of Present Illness (HPI) The following HPI elements were documented for the patient's wound: Location: lacerated wound to the left lower extremity Quality: Patient reports experiencing a dull pain to affected area(s). Severity: Patient states wound (s) are getting better. Duration: Patient has had the wound for < 3 weeks prior to presenting for treatment Timing: Pain in wound is Intermittent (comes and goes Context: The wound occurred when the patient had a blunt injury which caused a laceration to the left lower extremity Modifying Factors: Other treatment(s) tried include:as only applied a bandage to her leg Associated Signs and Symptoms: Patient reports having increase swelling. 82 year old patient who had an injury to her left lower extremity earlier this year and had been treated by me now comes back with a new injury to her left lower extremity which she's had for about 3 weeks now. Her past medical history is significant for diabetes mellitus, arthritis, basal cell carcinoma, collagenous colitis, essential hypertension, avitaminosis D. She is also status post abdominal hysterectomy, lumbar discectomy, neck surgery. She is a former smoker and quit in 1979. Of note in the recent past she was seen by Dr. Lucky Cowboy for what seems a sounds like injections of varicose veins. This was only around her ankles and not in the lower extremities. No Doppler studies were done. She was not advised to wear any compression stockings. This time around she hit the door of her car and has a significant hematoma which has gone down with time. She did not have any drainage from the wound. Patient History Information obtained from Patient. Allergies morphine HCl, penicillin Social  History MARLOW, BOWE ([id number]) Never smoker, Marital Status - Widowed, Alcohol Use - Never, Drug Use - No History, Caffeine Use - Never. Medical And Surgical History Notes Integumentary (Skin) senile purpura, acne erythematosa Review of Systems (ROS) Constitutional Symptoms (General Health) The patient has no complaints or symptoms. Eyes The patient has no complaints or symptoms. Ear/Nose/Mouth/Throat The patient has no complaints or symptoms. Hematologic/Lymphatic The patient has no complaints or symptoms. Respiratory The patient has no complaints or symptoms. Cardiovascular The patient has no complaints or symptoms. Gastrointestinal The patient has no complaints or symptoms. Endocrine The patient has no complaints or symptoms. Genitourinary The patient has no complaints or symptoms. Immunological The patient has no complaints or symptoms. Integumentary (Skin) Complains or has symptoms of Bleeding or bruising tendency. Musculoskeletal The patient has no complaints or symptoms. Neurologic The patient has no complaints or symptoms. Oncologic The patient has no complaints or symptoms. Psychiatric The patient has no complaints or symptoms. Her medications from a previous admission has been reviewed by me and numbness changed  Berberian, Wilhelmina R. ([id number]) Objective Constitutional Pulse regular. Respirations normal and unlabored. Afebrile. Vitals Time Taken: 2:32 PM, Height: 61 in, Source: Stated, Weight: 172 lbs, Source: Stated, BMI: 32.5, Temperature: 98.1 F, Pulse: 76 bpm, Blood Pressure: 142/66 mmHg. Eyes Nonicteric. Reactive to light. Ears, Nose, Mouth, and Throat Lips, teeth, and gums WNL.Marland Kitchen Moist mucosa without lesions. Neck supple and nontender. No palpable supraclavicular or cervical adenopathy. Normal sized without goiter. Respiratory WNL. No retractions.. Breath sounds WNL, No rubs, rales, rhonchi, or wheeze.. Cardiovascular Pedal Pulses WNL. her  ABI done recently was 1.04. her bilateral lower extremity has stage I lymphedema. Chest Breasts symmetical and no nipple discharge.. Breast tissue WNL, no masses, lumps, or tenderness.. Gastrointestinal (GI) Abdomen without masses or tenderness.. No liver or spleen enlargement or tenderness.. Lymphatic No adneopathy. No adenopathy. No adenopathy. Musculoskeletal Adexa without tenderness or enlargement.. Digits and nails w/o clubbing, cyanosis, infection, petechiae, ischemia, or inflammatory conditions.Marland Kitchen Psychiatric Judgement and insight Intact.. No evidence of depression, anxiety, or agitation.. General Notes: she has a small hematoma with some thick eschar which is hiding a ulcerated lesion down to the subcutaneous tissue. After appropriate moistening this and treating it with lidocaine ointment I was able to remove the eschar with mild amount of pain and there is still some debris which cannot be removed due to tenderness. I used a forcep to do this and bleeding was controlled with pressure Integumentary (Hair, Skin) Mcdermid, Mariabella R. ([id number]) No suspicious lesions. No crepitus or fluctuance. No peri-wound warmth or erythema. No masses.. Wound #2 status is Open. Original cause of wound was Trauma. The wound is located on the Left Lower Leg. The wound measures 1.7cm length x 0.5cm width x 0.2cm depth; 0.668cm^2 area and 0.134cm^3 volume. The wound is limited to skin breakdown. There is no tunneling or undermining noted. There is a medium amount of serosanguineous drainage noted. The wound margin is flat and intact. There is no granulation within the wound bed. There is a large (67-100%) amount of necrotic tissue within the wound bed including Eschar. The periwound skin appearance exhibited: Dry/Scaly. The periwound skin appearance did not exhibit: Callus, Crepitus, Excoriation, Fluctuance, Friable, Induration, Localized Edema, Rash, Scarring, Maceration, Moist, Atrophie Blanche,  Cyanosis, Ecchymosis, Hemosiderin Staining, Mottled, Pallor, Rubor, Erythema. Assessment Active Problems ICD-10 E11.622 - Type 2 diabetes mellitus with other skin ulcer S81.812A - Laceration without foreign body, left lower leg, initial encounter L97.222 - Non-pressure chronic ulcer of left calf with fat layer exposed I87.312 - Chronic venous hypertension (idiopathic) with ulcer of left lower extremity After cleaning the wound well have recommended: 1. skin the wound with soap and water and then applying silver alginate on alternate days. 2. Elevation and exercise 3. Good control of her diabetes mellitus 4. There are compression stockings of the 20-30 mm variety daily from morning to night and elevate her limbs appropriately while in bed. 5. Regular visits to the wound care center which she is agreeable to do. Procedures Wound #2 Wound #2 is a Trauma, Other located on the Left Lower Leg . There was a Skin/Subcutaneous Tissue Debridement [id number]) debridement with total area of 0.85 sq cm performed by Christin Fudge, MD. with the following instrument(s): Forceps to remove Viable and Non-Viable tissue/material including Exudate, Fibrin/Slough, Eschar, and Subcutaneous after achieving pain control using Lidocaine 4% Topical Solution. A time out was conducted prior to the start of the procedure. A Minimum amount of bleeding was controlled with Pressure. The procedure was tolerated well with a pain level of  0 throughout and a pain level of 1 following the procedure. Post Debridement Measurements: 1.7cm length x 0.5cm width x 0.2cm depth; 0.134cm^3 volume. TASHEKA, SOL ([id number]) Post procedure Diagnosis Wound #2: Same as Pre-Procedure Plan Wound Cleansing: Wound #2 Left Lower Leg: Cleanse wound with mild soap and water Primary Wound Dressing: Wound #2 Left Lower Leg: Aquacel Ag Secondary Dressing: Wound #2 Left Lower Leg: Boardered Foam Dressing Dressing Change  Frequency: Wound #2 Left Lower Leg: Change dressing every other day. Follow-up Appointments: Wound #2 Left Lower Leg: Return Appointment in 1 week. Edema Control: Wound #2 Left Lower Leg: Support Garment 20-30 mm/Hg pressure to: After cleaning the wound well have recommended: 1. skin the wound with soap and water and then applying silver alginate on alternate days. 2. Elevation and exercise 3. Good control of her diabetes mellitus 4. There are compression stockings of the 20-30 mm variety daily from morning to night and elevate her limbs appropriately while in bed. 5. Regular visits to the wound care center which she is agreeable to do. Electronic Signature(s) Signed: 07/23/2015 3:21:48 PM By: Christin Fudge MD, FACS Entered By: Christin Fudge on 07/23/2015 15:21:48 MIA, LEIMER ([id number]) -------------------------------------------------------------------------------- ROS/PFSH Details Patient Name: [name] Date of Service: 07/23/2015 2:15 PM Medical Record Number: [id number] Patient Account Number: [id number] Date of Birth/Sex: 11-Mar-1931 (82 y.o. Female) Treating RN: Macarthur Critchley Primary Care Physician: Margarita Rana Other Clinician: Referring Physician: Margarita Rana Treating Physician/Extender: Frann Rider in Treatment: 0 Information Obtained From Patient Wound History Do you currently have one or more open woundso No Integumentary (Skin) Complaints and Symptoms: Positive for: Bleeding or bruising tendency Medical History: Past Medical History Notes: senile purpura, acne erythematosa Constitutional Symptoms (General Health) Complaints and Symptoms: No Complaints or Symptoms Eyes Complaints and Symptoms: No Complaints or Symptoms Ear/Nose/Mouth/Throat Complaints and Symptoms: No Complaints or Symptoms Hematologic/Lymphatic Complaints and Symptoms: No Complaints or Symptoms Respiratory Complaints and Symptoms: No Complaints or  Symptoms Cardiovascular Complaints and Symptoms: No Complaints or Symptoms Steward, Seeley R. ([id number]) Medical History: Positive for: Hypertension Gastrointestinal Complaints and Symptoms: No Complaints or Symptoms Medical History: Positive for: Colitis Endocrine Complaints and Symptoms: No Complaints or Symptoms Medical History: Positive for: Type II Diabetes Time with diabetes: 3-4 years Treated with: Oral agents Blood sugar tested every day: No Genitourinary Complaints and Symptoms: No Complaints or Symptoms Immunological Complaints and Symptoms: No Complaints or Symptoms Musculoskeletal Complaints and Symptoms: No Complaints or Symptoms Medical History: Positive for: Osteoarthritis Neurologic Complaints and Symptoms: No Complaints or Symptoms Oncologic Complaints and Symptoms: No Complaints or Symptoms Medical HistoryDENIAH, TABET R. ([id number]) Negative for: Received Chemotherapy; Received Radiation Psychiatric Complaints and Symptoms: No Complaints or Symptoms Family and Social History Never smoker; Marital Status - Widowed; Alcohol Use: Never; Drug Use: No History; Caffeine Use: Never; Financial Concerns: No; Food, Clothing or Shelter Needs: No; Support System Lacking: No; Transportation Concerns: No; Advanced Directives: No; Patient does not want information on Advanced Directives Physician Affirmation I have reviewed and agree with the above information. Electronic Signature(s) Signed: 07/23/2015 2:39:40 PM By: Christin Fudge MD, FACS Signed: 07/23/2015 3:26:59 PM By: Rebecca Eaton RN, Sendra Previous Signature: 07/23/2015 2:39:24 PM Version By: Christin Fudge MD, FACS Entered By: Christin Fudge on 07/23/2015 14:39:39 KARSON, SLATER ([id number]) -------------------------------------------------------------------------------- SuperBill Details Patient Name: [name] Date of Service: 07/23/2015 Medical Record Number: [id number] Patient  Account Number: [id number] Date of Birth/Sex: Jul 17, 1931 (82 y.o. Female) Treating RN: Macarthur Critchley Primary Care Physician: Margarita Rana Other Clinician: Referring  Physician: Margarita Rana Treating Physician/Extender: Frann Rider in Treatment: 0 Diagnosis Coding ICD-10 Codes Code Description E11.622 Type 2 diabetes mellitus with other skin ulcer S81.812A Laceration without foreign body, left lower leg, initial encounter L97.222 Non-pressure chronic ulcer of left calf with fat layer exposed I87.312 Chronic venous hypertension (idiopathic) with ulcer of left lower extremity Facility Procedures CPT4 Code Description: [id number] - WOUND CARE VISIT-LEV 2 EST PT Modifier: Quantity: 1 CPT4 Code Description: [id number] - DEB SUBQ TISSUE 20 SQ CM/< ICD-10 Description Diagnosis E11.622 Type 2 diabetes mellitus with other skin ulcer S81.812A Laceration without foreign body, left lower leg, initi L97.222 Non-pressure chronic ulcer of  left calf with fat layer I87.312 Chronic venous hypertension (idiopathic) with ulcer of Modifier: al encounter exposed left lower ext Quantity: 1 remity Physician Procedures CPT4 Code Description: [id number] - WC PHYS LEVEL 4 - EST PT ICD-10 Description Diagnosis E11.622 Type 2 diabetes mellitus with other skin ulcer S81.812A Laceration without foreign body, left lower leg, initi L97.222 Non-pressure chronic ulcer of left  calf with fat layer I87.312 Chronic venous hypertension (idiopathic) with ulcer of Modifier: 25 al encounter exposed left lower ext Quantity: 1 remity CPT4 Code Description: [id number] - WC PHYS SUBQ TISS 20 SQ CM ICD-10 Description Diagnosis E11.622 Type 2 diabetes mellitus with other skin ulcer S81.812A Laceration without foreign body, left lower leg, initi Corro, Milinda R. ([id number]) Modifier: al encounter Quantity: 1 Electronic Signature(s) Signed: 07/23/2015 3:22:15 PM By: Christin Fudge MD, FACS Entered By:  Christin Fudge on 07/23/2015 15:22:15

## 2015-07-23 NOTE — Progress Notes (Signed)
[name], [name] ([id number]) Visit Report for 07/23/2015 Abuse/Suicide Risk Screen Details Patient Name: [name], [name] Date of Service: 07/23/2015 2:15 PM Medical Record Number: [id number] Patient Account Number: [id number] Date of Birth/Sex: [birth year] (82 y.o. Female) Treating RN: Macarthur Critchley Primary Care Physician: Margarita Rana Other Clinician: Referring Physician: Margarita Rana Treating Physician/Extender: Frann Rider in Treatment: 0 Abuse/Suicide Risk Screen Items Answer ABUSE/SUICIDE RISK SCREEN: Has anyone close to you tried to hurt or harm you recentlyo No Do you feel uncomfortable with anyone in your familyo No Has anyone forced you do things that you didnot want to doo No Do you have any thoughts of harming yourselfo No Patient displays signs or symptoms of abuse and/or neglect. No Electronic Signature(s) Signed: 07/23/2015 3:26:59 PM By: Rebecca Eaton RN, Sendra Entered By: Rebecca Eaton RN, Sendra on 07/23/2015 14:35:02 [name], [name] ([id number]) -------------------------------------------------------------------------------- Activities of Daily Living Details Patient Name: [name] Date of Service: 07/23/2015 2:15 PM Medical Record Number: [id number] Patient Account Number: [id number] Date of Birth/Sex: 14-Mar-1931 (82 y.o. Female) Treating RN: Macarthur Critchley Primary Care Physician: Margarita Rana Other Clinician: Referring Physician: Margarita Rana Treating Physician/Extender: Frann Rider in Treatment: 0 Activities of Daily Living Items Answer Activities of Daily Living (Please select one for each item) Drive Automobile Completely Able Take Medications Completely Able Use Telephone Completely Able Care for Appearance Completely Able Use Toilet Completely Able Bath / Shower Completely Able Dress Self Completely Able Feed Self Completely Able Walk Completely Able Get In / Out Bed Completely Able Housework Completely  Able Prepare Meals Completely Able Handle Money Completely Able Shop for Self Completely Able Electronic Signature(s) Signed: 07/23/2015 3:26:59 PM By: Rebecca Eaton RN, Sendra Entered By: Rebecca Eaton RN, Sendra on 07/23/2015 14:35:13 [name] ([id number]) -------------------------------------------------------------------------------- Education Assessment Details Patient Name: [name] Date of Service: 07/23/2015 2:15 PM Medical Record Number: [id number] Patient Account Number: [id number] Date of Birth/Sex: 06/14/1931 (82 y.o. Female) Treating RN: Macarthur Critchley Primary Care Physician: Margarita Rana Other Clinician: Referring Physician: Margarita Rana Treating Physician/Extender: Frann Rider in Treatment: 0 Primary Learner Assessed: Patient Learning Preferences/Education Level/Primary Language Learning Preference: Explanation Preferred Language: English Cognitive Barrier Assessment/Beliefs Language Barrier: No Translator Needed: No Memory Deficit: No Emotional Barrier: No Cultural/Religious Beliefs Affecting Medical No Care: Physical Barrier Assessment Impaired Vision: Yes Glasses Impaired Hearing: No Decreased Hand dexterity: No Knowledge/Comprehension Assessment Knowledge Level: High Comprehension Level: High Ability to understand written High instructions: Ability to understand verbal High instructions: Motivation Assessment Anxiety Level: Calm Cooperation: Cooperative Education Importance: Acknowledges Need Interest in Health Problems: Asks Questions Perception: Coherent Willingness to Engage in Self- High Management Activities: Readiness to Engage in Self- High Management Activities: Electronic Signature(s) Signed: 07/23/2015 3:26:59 PM By: Rebecca Eaton RN, Linna Darner, Krupa R. ([id number]) Entered By: Rebecca Eaton RN, Sendra on 07/23/2015 14:35:44 [name], [name]  ([id number]) -------------------------------------------------------------------------------- Fall Risk Assessment Details Patient Name: [name] Date of Service: 07/23/2015 2:15 PM Medical Record Number: [id number] Patient Account Number: [id number] Date of Birth/Sex: November 07, 1931 (82 y.o. Female) Treating RN: Macarthur Critchley Primary Care Physician: Margarita Rana Other Clinician: Referring Physician: Margarita Rana Treating Physician/Extender: Frann Rider in Treatment: 0 Fall Risk Assessment Items Have you had 2 or more falls in the last 12 monthso 0 No Have you had any fall that resulted in injury in the last 12 monthso 0 No FALL RISK ASSESSMENT: History of falling - immediate or within 3 months 0 No Secondary diagnosis 15 Yes Ambulatory aid None/bed rest/wheelchair/nurse 0 Yes Crutches/cane/walker 0 No Furniture  0 No IV Access/Saline Lock 0 No Gait/Training Normal/bed rest/immobile 0 Yes Weak 0 No Impaired 0 No Mental Status Oriented to own ability 0 Yes Electronic Signature(s) Signed: 07/23/2015 3:26:59 PM By: Rebecca Eaton, RN, Sendra Entered By: Rebecca Eaton RN, Sendra on 07/23/2015 14:35:55 [name] ([id number]) -------------------------------------------------------------------------------- Foot Assessment Details Patient Name: [name] Date of Service: 07/23/2015 2:15 PM Medical Record Number: [id number] Patient Account Number: [id number] Date of Birth/Sex: 05/01/1931 (82 y.o. Female) Treating RN: Macarthur Critchley Primary Care Physician: Margarita Rana Other Clinician: Referring Physician: Margarita Rana Treating Physician/Extender: Frann Rider in Treatment: 0 Foot Assessment Items Site Locations + = Sensation present, - = Sensation absent, C = Callus, U = Ulcer R = Redness, W = Warmth, M = Maceration, PU = Pre-ulcerative lesion F = Fissure, S = Swelling, D = Dryness Assessment Right: Left: Other Deformity: No No Prior Foot  Ulcer: No No Prior Amputation: No No Charcot Joint: No No Ambulatory Status: Ambulatory Without Help Gait: Steady Electronic Signature(s) Signed: 07/23/2015 3:26:59 PM By: Rebecca Eaton, RN, Sendra Entered By: Rebecca Eaton RN, Sendra on 07/23/2015 14:36:32 [name], [name] ([id number]) -------------------------------------------------------------------------------- Nutrition Risk Assessment Details Patient Name: [name] Date of Service: 07/23/2015 2:15 PM Medical Record Number: [id number] Patient Account Number: [id number] Date of Birth/Sex: Oct 30, 1931 (82 y.o. Female) Treating RN: Macarthur Critchley Primary Care Physician: Margarita Rana Other Clinician: Referring Physician: Margarita Rana Treating Physician/Extender: Frann Rider in Treatment: 0 Height (in): 61 Weight (lbs): 172 Body Mass Index (BMI): 32.5 Nutrition Risk Assessment Items NUTRITION RISK SCREEN: I have an illness or condition that made me change the kind and/or 0 No amount of food I eat I eat fewer than two meals per day 0 No I eat few fruits and vegetables, or milk products 0 No I have three or more drinks of beer, liquor or wine almost every day 0 No I have tooth or mouth problems that make it hard for me to eat 0 No I don't always have enough money to buy the food I need 0 No I eat alone most of the time 1 Yes I take three or more different prescribed or over-the-counter drugs a 1 Yes day Without wanting to, I have lost or gained 10 pounds in the last six 0 No months I am not always physically able to shop, cook and/or feed myself 0 No Nutrition Protocols Good Risk Protocol 0 No interventions needed Moderate Risk Protocol Electronic Signature(s) Signed: 07/23/2015 3:26:59 PM By: Rebecca Eaton RN, Sendra Entered By: Rebecca Eaton RN, Sendra on 07/23/2015 14:36:09

## 2015-07-23 NOTE — Progress Notes (Signed)
MERLEEN, DICE ([id number]) Visit Report for 07/23/2015 Allergy List Details Patient Name: [name], [name] Date of Service: 07/23/2015 2:15 PM Medical Record Number: [id number] Patient Account Number: [id number] Date of Birth/Sex: 1932-01-16 (82 y.o. Female) Treating RN: Macarthur Critchley Primary Care Physician: Margarita Rana Other Clinician: Referring Physician: Margarita Rana Treating Physician/Extender: Frann Rider in Treatment: 0 Allergies Active Allergies morphine HCl penicillin Allergy Notes Electronic Signature(s) Signed: 07/23/2015 3:26:59 PM By: Rebecca Eaton RN, Sendra Entered By: Rebecca Eaton RN, Sendra on 07/23/2015 14:33:30 [name], [name] ([id number]) -------------------------------------------------------------------------------- Arrival Information Details Patient Name: [name] Date of Service: 07/23/2015 2:15 PM Medical Record Number: [id number] Patient Account Number: [id number] Date of Birth/Sex: Jul 03, 1931 (82 y.o. Female) Treating RN: Macarthur Critchley Primary Care Physician: Margarita Rana Other Clinician: Referring Physician: Margarita Rana Treating Physician/Extender: Frann Rider in Treatment: 0 Visit Information Patient Arrived: Ambulatory Arrival Time: 14:30 Accompanied By: self Transfer Assistance: None Patient Identification Verified: Yes Secondary Verification Process Yes Completed: Patient Requires Transmission- No Based Precautions: Patient Has Alerts: Yes Patient Alerts: Patient on Blood Thinner 03/20/15 ABI (L) 1.04 History Since Last Visit All ordered tests and consults were completed: No Added or deleted any medications: No Any new allergies or adverse reactions: No Had a fall or experienced change in activities of daily living that may affect risk of falls: No Signs or symptoms of abuse/neglect since last visito No Hospitalized since last visit: No Has Dressing in Place as Prescribed: No Has Compression in  Place as Prescribed: No Electronic Signature(s) Signed: 07/23/2015 3:26:59 PM By: Rebecca Eaton, RN, Sendra Entered By: Rebecca Eaton RN, Sendra on 07/23/2015 14:44:08 [name], [name] ([id number]) -------------------------------------------------------------------------------- Clinic Level of Care Assessment Details Patient Name: [name] Date of Service: 07/23/2015 2:15 PM Medical Record Number: [id number] Patient Account Number: [id number] Date of Birth/Sex: 06-Feb-1932 (82 y.o. Female) Treating RN: Macarthur Critchley Primary Care Physician: Margarita Rana Other Clinician: Referring Physician: Margarita Rana Treating Physician/Extender: Frann Rider in Treatment: 0 Clinic Level of Care Assessment Items TOOL 1 Quantity Score X - Use when EandM and Procedure is performed on INITIAL visit 1 0 ASSESSMENTS - Nursing Assessment / Reassessment X - General Physical Exam (combine w/ comprehensive assessment (listed just 1 20 below) when performed on new pt. evals) X - Comprehensive Assessment (HX, ROS, Risk Assessments, Wounds Hx, etc.) 1 25 ASSESSMENTS - Wound and Skin Assessment / Reassessment []  - Dermatologic / Skin Assessment (not related to wound area) 0 ASSESSMENTS - Ostomy and/or Continence Assessment and Care []  - Incontinence Assessment and Management 0 []  - Ostomy Care Assessment and Management (repouching, etc.) 0 PROCESS - Coordination of Care X - Simple Patient / Family Education for ongoing care 1 15 []  - Complex (extensive) Patient / Family Education for ongoing care 0 X - Staff obtains Programmer, systems, Records, Test Results / Process Orders 1 10 []  - Staff telephones HHA, Nursing Homes / Clarify orders / etc 0 []  - Routine Transfer to another Facility (non-emergent condition) 0 []  - Routine Hospital Admission (non-emergent condition) 0 []  - New Admissions / Biomedical engineer / Ordering NPWT, Apligraf, etc. 0 []  - Emergency Hospital Admission (emergent condition)  0 PROCESS - Special Needs []  - Pediatric / Minor Patient Management 0 []  - Isolation Patient Management 0 [name], [name] ([id number]) []  - Hearing / Language / Visual special needs 0 []  - Assessment of Community assistance (transportation, D/C planning, etc.) 0 []  - Additional assistance / Altered mentation 0 []  - Support Surface(s) Assessment (bed, cushion, seat, etc.) 0  INTERVENTIONS - Miscellaneous []  - External ear exam 0 []  - Patient Transfer (multiple staff / Harrel Lemon Lift / Similar devices) 0 []  - Simple Staple / Suture removal (25 or less) 0 []  - Complex Staple / Suture removal (26 or more) 0 []  - Hypo/Hyperglycemic Management (do not check if billed separately) 0 []  - Ankle / Brachial Index (ABI) - do not check if billed separately 0 Has the patient been seen at the hospital within the last three years: Yes Total Score: 70 Level Of Care: New/Established - Level 2 Electronic Signature(s) Signed: 07/23/2015 3:26:59 PM By: Rebecca Eaton, RN, Sendra Entered By: Rebecca Eaton RN, Sendra on 07/23/2015 15:10:28 [name] ([id number]) -------------------------------------------------------------------------------- Encounter Discharge Information Details Patient Name: [name] Date of Service: 07/23/2015 2:15 PM Medical Record Number: [id number] Patient Account Number: [id number] Date of Birth/Sex: 1931/10/03 (82 y.o. Female) Treating RN: Macarthur Critchley Primary Care Physician: Margarita Rana Other Clinician: Referring Physician: Margarita Rana Treating Physician/Extender: Frann Rider in Treatment: 0 Encounter Discharge Information Items Discharge Pain Level: 0 Discharge Condition: Stable Ambulatory Status: Ambulatory Discharge Destination: Home Transportation: Private Auto Accompanied By: self Schedule Follow-up Appointment: Yes Medication Reconciliation completed and provided to Patient/Care Yes Dustine Stickler: Provided on Clinical Summary of  Care: 07/23/2015 Form Type Recipient Paper Patient [place] Signature(s) Signed: 07/23/2015 3:26:59 PM By: Rebecca Eaton RN, Sendra Previous Signature: 07/23/2015 3:07:36 PM Version By: Ruthine Dose Entered By: Rebecca Eaton RN, Sendra on 07/23/2015 15:12:30 [name] ([id number]) -------------------------------------------------------------------------------- Lower Extremity Assessment Details Patient Name: [name] Date of Service: 07/23/2015 2:15 PM Medical Record Number: [id number] Patient Account Number: [id number] Date of Birth/Sex: 1931-10-12 (82 y.o. Female) Treating RN: Macarthur Critchley Primary Care Physician: Margarita Rana Other Clinician: Referring Physician: Margarita Rana Treating Physician/Extender: Frann Rider in Treatment: 0 Edema Assessment Assessed: [Left: No] [Right: No] Edema: [Left: N] [Right: o] Calf Left: Right: Point of Measurement: 28 cm From Medial Instep 38 cm cm Ankle Left: Right: Point of Measurement: 8 cm From Medial Instep 22.8 cm cm Vascular Assessment Pulses: Posterior Tibial Dorsalis Pedis Palpable: [Left:Yes] Extremity colors, hair growth, and conditions: Extremity Color: [Left:Mottled] Hair Growth on Extremity: [Left:No] Temperature of Extremity: [Left:Warm] Capillary Refill: [Left:< 3 seconds] Dependent Rubor: [Left:No] Blanched when Elevated: [Left:No] Lipodermatosclerosis: [Left:No] Toe Nail Assessment Left: Right: Thick: No Discolored: No Deformed: No Improper Length and Hygiene: No Electronic Signature(s) Signed: 07/23/2015 3:26:59 PM By: Rebecca Eaton RN, Linna Darner, Arthurine R. ([id number]) Entered By: Rebecca Eaton RN, Sendra on 07/23/2015 14:38:19 [name] ([id number]) -------------------------------------------------------------------------------- Multi Wound Chart Details Patient Name: [name] Date of Service: 07/23/2015 2:15 PM Medical Record Number: [id number] Patient Account  Number: [id number] Date of Birth/Sex: October 11, 1931 (82 y.o. Female) Treating RN: Macarthur Critchley Primary Care Physician: Margarita Rana Other Clinician: Referring Physician: Margarita Rana Treating Physician/Extender: Frann Rider in Treatment: 0 Vital Signs Height(in): 61 Pulse(bpm): 76 Weight(lbs): 172 Blood Pressure 142/66 (mmHg): Body Mass Index(BMI): 32 Temperature(F): 98.1 Respiratory Rate (breaths/min): Photos: [2:No Photos] [N/A:N/A] Wound Location: [2:Left Lower Leg] [N/A:N/A] Wounding Event: [2:Trauma] [N/A:N/A] Primary Etiology: [2:Trauma, Other] [N/A:N/A] Comorbid History: [2:Hypertension, Colitis, Type II Diabetes, Osteoarthritis] [N/A:N/A] Date Acquired: [2:07/09/2015] [N/A:N/A] Weeks of Treatment: [2:0] [N/A:N/A] Wound Status: [2:Open] [N/A:N/A] Measurements L x W x D 1.7x0.5x0.1 [N/A:N/A] (cm) Area (cm) : [2:0.668] [N/A:N/A] Volume (cm) : [2:0.067] [N/A:N/A] % Reduction in Area: [2:0.00%] [N/A:N/A] % Reduction in Volume: 0.00% [N/A:N/A] Classification: [2:Partial Thickness] [N/A:N/A] HBO Classification: [2:Grade 0] [N/A:N/A] Exudate Amount: [2:None Present] [N/A:N/A] Wound Margin: [2:Flat and Intact] [N/A:N/A] Granulation Amount: [2:None Present (  0%)] [N/A:N/A] Exposed Structures: [2:Fascia: No Fat: No Tendon: No Muscle: No Joint: No Bone: No Limited to Skin Breakdown] [N/A:N/A] Epithelialization: [2:None] [N/A:N/A] Periwound Skin Texture: Edema: No N/A N/A Excoriation: No Induration: No Callus: No Crepitus: No Fluctuance: No Friable: No Rash: No Scarring: No Periwound Skin Dry/Scaly: Yes N/A N/A Moisture: Maceration: No Moist: No Periwound Skin Color: Atrophie Blanche: No N/A N/A Cyanosis: No Ecchymosis: No Erythema: No Hemosiderin Staining: No Mottled: No Pallor: No Rubor: No Tenderness on No N/A N/A Palpation: Wound Preparation: Ulcer Cleansing: N/A N/A Rinsed/Irrigated with Saline Topical Anesthetic Applied: Other:  lidocaine 4% Treatment Notes Electronic Signature(s) Signed: 07/23/2015 3:26:59 PM By: Rebecca Eaton, RN, Sendra Entered By: Rebecca Eaton RN, Sendra on 07/23/2015 14:56:12 [name], [name] ([id number]) -------------------------------------------------------------------------------- [place] Details Patient Name: [name] Date of Service: 07/23/2015 2:15 PM Medical Record Number: [id number] Patient Account Number: [id number] Date of Birth/Sex: 1931/06/07 (82 y.o. Female) Treating RN: Macarthur Critchley Primary Care Physician: Margarita Rana Other Clinician: Referring Physician: Margarita Rana Treating Physician/Extender: Frann Rider in Treatment: 0 Active Inactive Orientation to the Wound Care Program Nursing Diagnoses: Knowledge deficit related to the wound healing center program Goals: Patient/caregiver will verbalize understanding of the [facility] Program Date Initiated: 07/23/2015 Goal Status: Active Interventions: Provide education on orientation to the wound center Notes: Wound/Skin Impairment Nursing Diagnoses: Impaired tissue integrity Goals: Ulcer/skin breakdown will have a volume reduction of 30% by week 4 Date Initiated: 07/23/2015 Goal Status: Active Ulcer/skin breakdown will have a volume reduction of 50% by week 8 Date Initiated: 07/23/2015 Goal Status: Active Ulcer/skin breakdown will have a volume reduction of 80% by week 12 Date Initiated: 07/23/2015 Goal Status: Active Ulcer/skin breakdown will heal within 14 weeks Date Initiated: 07/23/2015 Goal Status: Active Interventions: Assess patient/caregiver ability to obtain necessary supplies Assess patient/caregiver ability to perform ulcer/skin care regimen upon admission and as needed [name], [name] ([id number]) Assess ulceration(s) every visit Notes: Electronic Signature(s) Signed: 07/23/2015 3:26:59 PM By: Rebecca Eaton, RN, Sendra Entered By: Rebecca Eaton RN, Sendra on  07/23/2015 14:55:39 [name] ([id number]) -------------------------------------------------------------------------------- Pain Assessment Details Patient Name: [name] Date of Service: 07/23/2015 2:15 PM Medical Record Number: [id number] Patient Account Number: [id number] Date of Birth/Sex: [birth year] (82 y.o. Female) Treating RN: Macarthur Critchley Primary Care Physician: Margarita Rana Other Clinician: Referring Physician: Margarita Rana Treating Physician/Extender: Frann Rider in Treatment: 0 Active Problems Location of Pain Severity and Description of Pain Patient Has Paino No Site Locations Pain Management and Medication Current Pain Management: Electronic Signature(s) Signed: 07/23/2015 3:26:59 PM By: Rebecca Eaton RN, Sendra Entered By: Rebecca Eaton RN, Sendra on 07/23/2015 14:31:26 [name], [name] ([id number]) -------------------------------------------------------------------------------- Patient/Caregiver Education Details Patient Name: [name] Date of Service: 07/23/2015 2:15 PM Medical Record Number: [id number] Patient Account Number: [id number] Date of Birth/Gender: 01-Sep-1931 (82 y.o. Female) Treating RN: Macarthur Critchley Primary Care Physician: Margarita Rana Other Clinician: Referring Physician: Margarita Rana Treating Physician/Extender: Frann Rider in Treatment: 0 Education Assessment Education Provided To: Patient Education Topics Provided Wound/Skin Impairment: Handouts: Caring for Your Ulcer, Skin Care Do's and Dont's Methods: Explain/Verbal Responses: State content correctly Electronic Signature(s) Signed: 07/23/2015 3:26:59 PM By: Rebecca Eaton, RN, Sendra Entered By: Rebecca Eaton RN, Sendra on 07/23/2015 15:12:50 [name] ([id number]) -------------------------------------------------------------------------------- Wound Assessment Details Patient Name: [name] Date of Service: 07/23/2015 2:15  PM Medical Record Number: [id number] Patient Account Number: [id number] Date of Birth/Sex: March 09, 1932 (82 y.o. Female) Treating RN: Macarthur Critchley Primary Care Physician: Margarita Rana Other Clinician: Referring Physician: Venia Minks,  Izora Gala Treating Physician/Extender: Frann Rider in Treatment: 0 Wound Status Wound Number: 2 Primary Trauma, Other Etiology: Wound Location: Left Lower Leg Wound Open Wounding Event: Trauma Status: Date Acquired: 07/09/2015 Comorbid Hypertension, Colitis, Type II Weeks Of Treatment: 0 History: Diabetes, Osteoarthritis Clustered Wound: No Photos Photo Uploaded By: Rebecca Eaton, RN, Roslynn Amble on 07/23/2015 15:23:55 Wound Measurements Length: (cm) 1.7 Width: (cm) 0.5 Depth: (cm) 0.2 Area: (cm) 0.668 Volume: (cm) 0.134 % Reduction in Area: 0% % Reduction in Volume: -100% Epithelialization: None Tunneling: No Undermining: No Wound Description Classification: Partial Thickness Foul Odor A Diabetic Severity (Wagner): Grade 0 Wound Margin: Flat and Intact Exudate Amount: Medium Exudate Type: Serosanguineous Exudate Color: red, brown fter Cleansing: No Wound Bed Granulation Amount: None Present (0%) Exposed Structure Necrotic Amount: Large (67-100%) Fascia Exposed: No Necrotic Quality: Eschar Fat Layer Exposed: No [name], [name] ([id number]) Tendon Exposed: No Muscle Exposed: No Joint Exposed: No Bone Exposed: No Limited to Skin Breakdown Periwound Skin Texture Texture Color No Abnormalities Noted: No No Abnormalities Noted: No Callus: No Atrophie Blanche: No Crepitus: No Cyanosis: No Excoriation: No Ecchymosis: No Fluctuance: No Erythema: No Friable: No Hemosiderin Staining: No Induration: No Mottled: No Localized Edema: No Pallor: No Rash: No Rubor: No Scarring: No Moisture No Abnormalities Noted: No Dry / Scaly: Yes Maceration: No Moist: No Wound Preparation Ulcer Cleansing: Rinsed/Irrigated with  Saline Topical Anesthetic Applied: Other: lidocaine 4%, Treatment Notes Wound #2 (Left Lower Leg) 1. Cleansed with: Clean wound with Normal Saline 2. Anesthetic Topical Lidocaine 4% cream to wound bed prior to debridement 4. Dressing Applied: Aquacel Ag 5. Secondary Dressing Applied Bordered Foam Dressing 7. Secured with Support Garment 20-30 mm/Hg pressure to: Electronic Signature(s) Signed: 07/23/2015 3:26:59 PM By: Rebecca Eaton, RN, Sendra Entered By: Rebecca Eaton RN, Sendra on 07/23/2015 15:12:02 [name], [name] ([id number]) -------------------------------------------------------------------------------- [place] Details Patient Name: [name] Date of Service: 07/23/2015 2:15 PM Medical Record Number: [id number] Patient Account Number: [id number] Date of Birth/Sex: Nov 24, 1931 (82 y.o. Female) Treating RN: Macarthur Critchley Primary Care Physician: Margarita Rana Other Clinician: Referring Physician: Margarita Rana Treating Physician/Extender: Frann Rider in Treatment: 0 Vital Signs Time Taken: 14:32 Temperature (F): 98.1 Height (in): 61 Pulse (bpm): 76 Source: Stated Blood Pressure (mmHg): 142/66 Weight (lbs): 172 Reference Range: 80 - 120 mg / dl Source: Stated Body Mass Index (BMI): 32.5 Electronic Signature(s) Signed: 07/23/2015 3:26:59 PM By: Rebecca Eaton RN, Sendra Entered By: Rebecca Eaton RN, Sendra on 07/23/2015 14:33:00

## 2015-07-25 ENCOUNTER — Other Ambulatory Visit: Payer: Self-pay | Admitting: Gastroenterology

## 2015-07-25 NOTE — Telephone Encounter (Signed)
Patient stated that [name] sent RX refill 3 times. Buesonide 3 mg capsule 3x'a daily

## 2015-07-30 ENCOUNTER — Encounter: Payer: Medicare Other | Admitting: Surgery

## 2015-07-30 DIAGNOSIS — M1990 Unspecified osteoarthritis, unspecified site: Secondary | ICD-10-CM | POA: Diagnosis not present

## 2015-07-30 DIAGNOSIS — I87312 Chronic venous hypertension (idiopathic) with ulcer of left lower extremity: Secondary | ICD-10-CM | POA: Diagnosis not present

## 2015-07-30 DIAGNOSIS — I10 Essential (primary) hypertension: Secondary | ICD-10-CM | POA: Diagnosis not present

## 2015-07-30 DIAGNOSIS — L97222 Non-pressure chronic ulcer of left calf with fat layer exposed: Secondary | ICD-10-CM | POA: Diagnosis not present

## 2015-07-30 DIAGNOSIS — E11622 Type 2 diabetes mellitus with other skin ulcer: Secondary | ICD-10-CM | POA: Diagnosis not present

## 2015-07-30 DIAGNOSIS — S81812A Laceration without foreign body, left lower leg, initial encounter: Secondary | ICD-10-CM | POA: Diagnosis not present

## 2015-07-30 DIAGNOSIS — Z87891 Personal history of nicotine dependence: Secondary | ICD-10-CM | POA: Diagnosis not present

## 2015-07-30 NOTE — Progress Notes (Addendum)
ODILIA, URESTE ([id number]) Visit Report for 07/30/2015 Arrival Information Details Patient Name: PHINLEY, SCHRACK Date of Service: 07/30/2015 3:15 PM Medical Record Number: [id number] Patient Account Number: [id number] Date of Birth/Sex: 05-Nov-1931 (82 y.o. Female) Treating RN: Cornell Barman Primary Care Physician: Margarita Rana Other Clinician: Referring Physician: Margarita Rana Treating Physician/Extender: Frann Rider in Treatment: 1 Visit Information History Since Last Visit Added or deleted any medications: No Patient Arrived: Ambulatory Any new allergies or adverse reactions: No Arrival Time: 15:17 Had a fall or experienced change in No Accompanied By: self activities of daily living that may affect Transfer Assistance: None risk of falls: Patient Identification Verified: Yes Signs or symptoms of abuse/neglect since last No Secondary Verification Process Yes visito Completed: Has Dressing in Place as Prescribed: Yes Patient Requires Transmission- No Pain Present Now: No Based Precautions: Patient Has Alerts: Yes Patient Alerts: Patient on Blood Thinner 03/20/15 ABI (L) 1.04 Electronic Signature(s) Signed: 07/30/2015 4:15:15 PM By: Gretta Cool, RN, BSN, Kim RN, BSN Entered By: Gretta Cool, RN, BSN, Kim on 07/30/2015 15:18:24 [name] ([id number]) -------------------------------------------------------------------------------- Clinic Level of Care Assessment Details Patient Name: [name] Date of Service: 07/30/2015 3:15 PM Medical Record Number: [id number] Patient Account Number: [id number] Date of Birth/Sex: 05/30/1931 (82 y.o. Female) Treating RN: Cornell Barman Primary Care Physician: Margarita Rana Other Clinician: Referring Physician: Margarita Rana Treating Physician/Extender: Frann Rider in Treatment: 1 Clinic Level of Care Assessment Items TOOL 4 Quantity Score []  - Use when only an EandM is performed on FOLLOW-UP visit 0 ASSESSMENTS  - Nursing Assessment / Reassessment []  - Reassessment of Co-morbidities (includes updates in patient status) 0 X - Reassessment of Adherence to Treatment Plan 1 5 ASSESSMENTS - Wound and Skin Assessment / Reassessment X - Simple Wound Assessment / Reassessment - one wound 1 5 []  - Complex Wound Assessment / Reassessment - multiple wounds 0 []  - Dermatologic / Skin Assessment (not related to wound area) 0 ASSESSMENTS - Focused Assessment []  - Circumferential Edema Measurements - multi extremities 0 []  - Nutritional Assessment / Counseling / Intervention 0 []  - Lower Extremity Assessment (monofilament, tuning fork, pulses) 0 []  - Peripheral Arterial Disease Assessment (using hand held doppler) 0 ASSESSMENTS - Ostomy and/or Continence Assessment and Care []  - Incontinence Assessment and Management 0 []  - Ostomy Care Assessment and Management (repouching, etc.) 0 PROCESS - Coordination of Care X - Simple Patient / Family Education for ongoing care 1 15 []  - Complex (extensive) Patient / Family Education for ongoing care 0 []  - Staff obtains Programmer, systems, Records, Test Results / Process Orders 0 []  - Staff telephones HHA, Nursing Homes / Clarify orders / etc 0 []  - Routine Transfer to another Facility (non-emergent condition) 0 Gurevich, Lakisa R. ([id number]) []  - Routine Hospital Admission (non-emergent condition) 0 []  - New Admissions / Biomedical engineer / Ordering NPWT, Apligraf, etc. 0 []  - Emergency Hospital Admission (emergent condition) 0 X - Simple Discharge Coordination 1 10 []  - Complex (extensive) Discharge Coordination 0 PROCESS - Special Needs []  - Pediatric / Minor Patient Management 0 []  - Isolation Patient Management 0 []  - Hearing / Language / Visual special needs 0 []  - Assessment of Community assistance (transportation, D/C planning, etc.) 0 []  - Additional assistance / Altered mentation 0 []  - Support Surface(s) Assessment (bed, cushion, seat, etc.) 0 INTERVENTIONS  - Wound Cleansing / Measurement X - Simple Wound Cleansing - one wound 1 5 []  - Complex Wound Cleansing - multiple wounds 0 X - Wound Imaging (  photographs - any number of wounds) 1 5 []  - Wound Tracing (instead of photographs) 0 X - Simple Wound Measurement - one wound 1 5 []  - Complex Wound Measurement - multiple wounds 0 INTERVENTIONS - Wound Dressings X - Small Wound Dressing one or multiple wounds 1 10 []  - Medium Wound Dressing one or multiple wounds 0 []  - Large Wound Dressing one or multiple wounds 0 []  - Application of Medications - topical 0 []  - Application of Medications - injection 0 INTERVENTIONS - Miscellaneous []  - External ear exam 0 Alejos, Alishah R. ([id number]) []  - Specimen Collection (cultures, biopsies, blood, body fluids, etc.) 0 []  - Specimen(s) / Culture(s) sent or taken to Lab for analysis 0 []  - Patient Transfer (multiple staff / Harrel Lemon Lift / Similar devices) 0 []  - Simple Staple / Suture removal (25 or less) 0 []  - Complex Staple / Suture removal (26 or more) 0 []  - Hypo / Hyperglycemic Management (close monitor of Blood Glucose) 0 []  - Ankle / Brachial Index (ABI) - do not check if billed separately 0 X - Vital Signs 1 5 Has the patient been seen at the hospital within the last three years: Yes Total Score: 65 Level Of Care: New/Established - Level 2 Electronic Signature(s) Signed: 07/30/2015 4:15:15 PM By: Gretta Cool, RN, BSN, Kim RN, BSN Entered By: Gretta Cool, RN, BSN, Kim on 07/30/2015 15:29:27 ALISSHA, SHIRES ([id number]) -------------------------------------------------------------------------------- Encounter Discharge Information Details Patient Name: [name] Date of Service: 07/30/2015 3:15 PM Medical Record Number: [id number] Patient Account Number: [id number] Date of Birth/Sex: 09/02/1931 (82 y.o. Female) Treating RN: Cornell Barman Primary Care Physician: Margarita Rana Other Clinician: Referring Physician: Margarita Rana Treating  Physician/Extender: Frann Rider in Treatment: 1 Encounter Discharge Information Items Discharge Pain Level: 0 Discharge Condition: Stable Ambulatory Status: Ambulatory Discharge Destination: Home Transportation: Private Auto Accompanied By: self Schedule Follow-up Appointment: Yes Medication Reconciliation completed and provided to Patient/Care Yes Jorgeluis Gurganus: Provided on Clinical Summary of Care: 07/30/2015 Form Type Recipient Paper Patient [facility] Electronic Signature(s) Signed: 07/30/2015 3:35:48 PM By: Ruthine Dose Entered By: Ruthine Dose on 07/30/2015 15:35:47 Bady, Clemens Catholic ([id number]) -------------------------------------------------------------------------------- Lower Extremity Assessment Details Patient Name: [name] Date of Service: 07/30/2015 3:15 PM Medical Record Number: [id number] Patient Account Number: [id number] Date of Birth/Sex: Feb 24, 1932 (82 y.o. Female) Treating RN: Cornell Barman Primary Care Physician: Margarita Rana Other Clinician: Referring Physician: Margarita Rana Treating Physician/Extender: Frann Rider in Treatment: 1 Edema Assessment Assessed: [Left: No] [Right: No] E[Left: dema] [Right: :] Calf Left: Right: Point of Measurement: 28 cm From Medial Instep 36 cm cm Ankle Left: Right: Point of Measurement: 8 cm From Medial Instep 22.5 cm cm Vascular Assessment Pulses: Posterior Tibial Dorsalis Pedis Palpable: [Right:Yes] Extremity colors, hair growth, and conditions: Extremity Color: [Right:Normal] Hair Growth on Extremity: [Right:No] Temperature of Extremity: [Right:Warm] Capillary Refill: [Right:< 3 seconds] Toe Nail Assessment Left: Right: Thick: No Discolored: No Deformed: No Improper Length and Hygiene: No Electronic Signature(s) Signed: 07/30/2015 4:15:15 PM By: Gretta Cool, RN, BSN, Kim RN, BSN Entered By: Gretta Cool, RN, BSN, Kim on 07/30/2015 15:21:22 Vogel, Clemens Catholic  ([id number]) -------------------------------------------------------------------------------- Multi Wound Chart Details Patient Name: [name] Date of Service: 07/30/2015 3:15 PM Medical Record Number: [id number] Patient Account Number: [id number] Date of Birth/Sex: 05-02-1931 (82 y.o. Female) Treating RN: Cornell Barman Primary Care Physician: Margarita Rana Other Clinician: Referring Physician: Margarita Rana Treating Physician/Extender: Frann Rider in Treatment: 1 Vital Signs Height(in): 61 Pulse(bpm): 88 Weight(lbs): 172 Blood Pressure 136/60 (mmHg): Body  Mass Index(BMI): 32 Temperature(F): 97.9 Respiratory Rate 16 (breaths/min): Photos: [2:No Photos] [N/A:N/A] Wound Location: [2:Left Lower Leg] [N/A:N/A] Wounding Event: [2:Trauma] [N/A:N/A] Primary Etiology: [2:Trauma, Other] [N/A:N/A] Comorbid History: [2:Hypertension, Colitis, Type II Diabetes, Osteoarthritis] [N/A:N/A] Date Acquired: [2:07/09/2015] [N/A:N/A] Weeks of Treatment: [2:1] [N/A:N/A] Wound Status: [2:Open] [N/A:N/A] Measurements L x W x D 1.2x0.5x0.1 [N/A:N/A] (cm) Area (cm) : [2:0.471] [N/A:N/A] Volume (cm) : [2:0.047] [N/A:N/A] % Reduction in Area: [2:29.50%] [N/A:N/A] % Reduction in Volume: 64.90% [N/A:N/A] Classification: [2:Partial Thickness] [N/A:N/A] HBO Classification: [2:Grade 0] [N/A:N/A] Exudate Amount: [2:Medium] [N/A:N/A] Exudate Type: [2:Serosanguineous] [N/A:N/A] Exudate Color: [2:red, brown] [N/A:N/A] Wound Margin: [2:Flat and Intact] [N/A:N/A] Granulation Amount: [2:Large (67-100%)] [N/A:N/A] Granulation Quality: [2:Pink] [N/A:N/A] Necrotic Amount: [2:None Present (0%)] [N/A:N/A] Exposed Structures: [2:Fascia: No Fat: No Tendon: No Muscle: No Joint: No] [N/A:N/A] Bone: No Limited to Skin Breakdown Epithelialization: Small (1-33%) N/A N/A Periwound Skin Texture: Edema: No N/A N/A Excoriation: No Induration: No Callus: No Crepitus: No Fluctuance: No Friable:  No Rash: No Scarring: No Periwound Skin Dry/Scaly: Yes N/A N/A Moisture: Maceration: No Moist: No Periwound Skin Color: Atrophie Blanche: No N/A N/A Cyanosis: No Ecchymosis: No Erythema: No Hemosiderin Staining: No Mottled: No Pallor: No Rubor: No Tenderness on No N/A N/A Palpation: Wound Preparation: Ulcer Cleansing: N/A N/A Rinsed/Irrigated with Saline Topical Anesthetic Applied: Other: lidocaine 4% Treatment Notes Electronic Signature(s) Signed: 07/30/2015 4:15:15 PM By: Gretta Cool, RN, BSN, Kim RN, BSN Entered By: Gretta Cool, RN, BSN, Kim on 07/30/2015 15:28:33 BERNINA, EDMISTER ([id number]) -------------------------------------------------------------------------------- Multi-Disciplinary Care Plan Details Patient Name: [name] Date of Service: 07/30/2015 3:15 PM Medical Record Number: [id number] Patient Account Number: [id number] Date of Birth/Sex: Aug 25, 1931 (82 y.o. Female) Treating RN: Cornell Barman Primary Care Physician: Margarita Rana Other Clinician: Referring Physician: Margarita Rana Treating Physician/Extender: Frann Rider in Treatment: 1 Active Inactive Orientation to the Wound Care Program Nursing Diagnoses: Knowledge deficit related to the wound healing center program Goals: Patient/caregiver will verbalize understanding of the [facility] Date Initiated: 07/23/2015 Goal Status: Active Interventions: Provide education on orientation to the wound center Notes: Wound/Skin Impairment Nursing Diagnoses: Impaired tissue integrity Goals: Ulcer/skin breakdown will have a volume reduction of 30% by week 4 Date Initiated: 07/23/2015 Goal Status: Active Ulcer/skin breakdown will have a volume reduction of 50% by week 8 Date Initiated: 07/23/2015 Goal Status: Active Ulcer/skin breakdown will have a volume reduction of 80% by week 12 Date Initiated: 07/23/2015 Goal Status: Active Ulcer/skin breakdown will heal within 14  weeks Date Initiated: 07/23/2015 Goal Status: Active Interventions: Assess patient/caregiver ability to obtain necessary supplies Assess patient/caregiver ability to perform ulcer/skin care regimen upon admission and as needed PIEDAD, HANSMAN ([id number]) Assess ulceration(s) every visit Notes: Electronic Signature(s) Signed: 07/30/2015 4:15:15 PM By: Gretta Cool, RN, BSN, Kim RN, BSN Entered By: Gretta Cool, RN, BSN, Kim on 07/30/2015 15:28:24 MALK, BUSBEE ([id number]) -------------------------------------------------------------------------------- Pain Assessment Details Patient Name: [name] Date of Service: 07/30/2015 3:15 PM Medical Record Number: [id number] Patient Account Number: [id number] Date of Birth/Sex: [birth year] (82 y.o. Female) Treating RN: Cornell Barman Primary Care Physician: Margarita Rana Other Clinician: Referring Physician: Margarita Rana Treating Physician/Extender: Frann Rider in Treatment: 1 Active Problems Location of Pain Severity and Description of Pain Patient Has Paino No Site Locations Pain Management and Medication Current Pain Management: Electronic Signature(s) Signed: 07/30/2015 4:15:15 PM By: Gretta Cool, RN, BSN, Kim RN, BSN Entered By: Gretta Cool, RN, BSN, Kim on 07/30/2015 15:18:32 Starn, Clemens Catholic ([id number]) -------------------------------------------------------------------------------- Patient/Caregiver Education Details Patient Name: [name] Date of  Service: 07/30/2015 3:15 PM Medical Record Number: [id number] Patient Account Number: [id number] Date of Birth/Gender: 1931/11/24 (82 y.o. Female) Treating RN: Cornell Barman Primary Care Physician: Margarita Rana Other Clinician: Referring Physician: Margarita Rana Treating Physician/Extender: Frann Rider in Treatment: 1 Education Assessment Education Provided To: Patient Education Topics Provided Wound/Skin Impairment: Handouts: Caring for Your Ulcer, Other: wound  care as prescribed Methods: Demonstration, Explain/Verbal Responses: State content correctly Electronic Signature(s) Signed: 07/30/2015 4:15:15 PM By: Gretta Cool, RN, BSN, Kim RN, BSN Entered By: Gretta Cool, RN, BSN, Kim on 07/30/2015 15:30:57 KAYELENE, ELIE ([id number]) -------------------------------------------------------------------------------- Wound Assessment Details Patient Name: [name] Date of Service: 07/30/2015 3:15 PM Medical Record Number: [id number] Patient Account Number: [id number] Date of Birth/Sex: [birth year] (82 y.o. Female) Treating RN: Cornell Barman Primary Care Physician: Margarita Rana Other Clinician: Referring Physician: Margarita Rana Treating Physician/Extender: Frann Rider in Treatment: 1 Wound Status Wound Number: 2 Primary Trauma, Other Etiology: Wound Location: Left Lower Leg Secondary Diabetic Wound/Ulcer of the Lower Wounding Event: Trauma Etiology: Extremity Date Acquired: 07/09/2015 Wound Status: Open Weeks Of Treatment: 1 Comorbid Hypertension, Colitis, Type II Clustered Wound: No History: Diabetes, Osteoarthritis Wound Measurements Length: (cm) 1.2 Width: (cm) 0.5 Depth: (cm) 0.1 Area: (cm) 0.471 Volume: (cm) 0.047 % Reduction in Area: 29.5% % Reduction in Volume: 64.9% Epithelialization: Small (1-33%) Tunneling: No Undermining: No Wound Description Classification: Partial Thickness Foul Odor Diabetic Severity (Wagner): Grade 0 Wound Margin: Flat and Intact Exudate Amount: Medium Exudate Type: Serosanguineous Exudate Color: red, brown After Cleansing: No Wound Bed Granulation Amount: Large (67-100%) Exposed Structure Granulation Quality: Pink Fascia Exposed: No Necrotic Amount: None Present (0%) Fat Layer Exposed: No Tendon Exposed: No Muscle Exposed: No Joint Exposed: No Bone Exposed: No Limited to Skin Breakdown Periwound Skin Texture Texture Color No Abnormalities Noted: No No Abnormalities Noted:  No Callus: No Atrophie Blanche: No Crepitus: No Cyanosis: No Maniaci, Lakiya R. ([id number]) Excoriation: No Ecchymosis: No Fluctuance: No Erythema: No Friable: No Hemosiderin Staining: No Induration: No Mottled: No Localized Edema: No Pallor: No Rash: No Rubor: No Scarring: No Moisture No Abnormalities Noted: No Dry / Scaly: Yes Maceration: No Moist: No Wound Preparation Ulcer Cleansing: Rinsed/Irrigated with Saline Topical Anesthetic Applied: Other: lidocaine 4%, Treatment Notes Wound #2 (Left Lower Leg) 1. Cleansed with: Clean wound with Normal Saline 2. Anesthetic Topical Lidocaine 4% cream to wound bed prior to debridement 4. Dressing Applied: Aquacel Ag 5. Secondary Dressing Applied Bordered Foam Dressing Electronic Signature(s) Signed: 08/03/2015 9:23:38 AM By: Gretta Cool RN, BSN, Kim RN, BSN Previous Signature: 07/30/2015 4:15:15 PM Version By: Gretta Cool RN, BSN, Kim RN, BSN Entered By: Gretta Cool, RN, BSN, Kim on 08/03/2015 09:23:38 TARRA, BOWERMASTER ([id number]) -------------------------------------------------------------------------------- [place] Details Patient Name: [name] Date of Service: 07/30/2015 3:15 PM Medical Record Number: [id number] Patient Account Number: [id number] Date of Birth/Sex: 02-Aug-1931 (82 y.o. Female) Treating RN: Cornell Barman Primary Care Physician: Margarita Rana Other Clinician: Referring Physician: Margarita Rana Treating Physician/Extender: Frann Rider in Treatment: 1 Vital Signs Time Taken: 05:18 Temperature (F): 97.9 Height (in): 61 Pulse (bpm): 88 Weight (lbs): 172 Respiratory Rate (breaths/min): 16 Body Mass Index (BMI): 32.5 Blood Pressure (mmHg): 136/60 Reference Range: 80 - 120 mg / dl Electronic Signature(s) Signed: 07/30/2015 4:15:15 PM By: Gretta Cool, RN, BSN, Kim RN, BSN Entered By: Gretta Cool, RN, BSN, Kim on 07/30/2015 15:19:04

## 2015-07-30 NOTE — Progress Notes (Addendum)
[name], [name] ([id number]) Visit Report for 07/30/2015 Chief Complaint Document Details Patient Name: [name], [name] Date of Service: 07/30/2015 3:15 PM Medical Record Number: [id number] Patient Account Number: [id number] Date of Birth/Sex: 1931/09/05 (82 y.o. Female) Treating RN: Macarthur Critchley Primary Care Physician: Margarita Rana Other Clinician: Referring Physician: Margarita Rana Treating Physician/Extender: Frann Rider in Treatment: 1 Information Obtained from: Patient Chief Complaint Patient presents to the wound care center for a consult due non healing wound to the left lower extremity which she's had for about 3 weeks now Electronic Signature(s) Signed: 07/30/2015 3:34:20 PM By: Christin Fudge MD, FACS Entered By: Christin Fudge on 07/30/2015 15:34:19 [name] ([id number]) -------------------------------------------------------------------------------- HPI Details Patient Name: [name] Date of Service: 07/30/2015 3:15 PM Medical Record Number: [id number] Patient Account Number: [id number] Date of Birth/Sex: 1931-05-24 (82 y.o. Female) Treating RN: Macarthur Critchley Primary Care Physician: Margarita Rana Other Clinician: Referring Physician: Margarita Rana Treating Physician/Extender: Frann Rider in Treatment: 1 History of Present Illness Location: lacerated wound to the left lower extremity Quality: Patient reports experiencing a dull pain to affected area(s). Severity: Patient states wound (s) are getting better. Duration: Patient has had the wound for < 3 weeks prior to presenting for treatment Timing: Pain in wound is Intermittent (comes and goes Context: The wound occurred when the patient had a blunt injury which caused a laceration to the left lower extremity Modifying Factors: Other treatment(s) tried include:as only applied a bandage to her leg Associated Signs and Symptoms: Patient reports having increase swelling. HPI  Description: 82 year old patient who had an injury to her left lower extremity earlier this year and had been treated by me now comes back with a new injury to her left lower extremity which she's had for about 3 weeks now. Her past medical history is significant for diabetes mellitus, arthritis, basal cell carcinoma, collagenous colitis, essential hypertension, avitaminosis D. She is also status post abdominal hysterectomy, lumbar discectomy, neck surgery. She is a former smoker and quit in 1979. Of note in the recent past she was seen by Dr. Lucky Cowboy for what seems a sounds like injections of varicose veins. This was only around her ankles and not in the lower extremities. No Doppler studies were done. She was not advised to wear any compression stockings. This time around she hit the door of her car and has a significant hematoma which has gone down with time. She did not have any drainage from the wound. Electronic Signature(s) Signed: 07/30/2015 3:34:28 PM By: Christin Fudge MD, FACS Entered By: Christin Fudge on 07/30/2015 15:34:27 [name], [name] ([id number]) -------------------------------------------------------------------------------- Physical Exam Details Patient Name: [name] Date of Service: 07/30/2015 3:15 PM Medical Record Number: [id number] Patient Account Number: [id number] Date of Birth/Sex: [birth year] (82 y.o. Female) Treating RN: Macarthur Critchley Primary Care Physician: Margarita Rana Other Clinician: Referring Physician: Margarita Rana Treating Physician/Extender: Frann Rider in Treatment: 1 Constitutional . Pulse regular. Respirations normal and unlabored. Afebrile. . Eyes Nonicteric. Reactive to light. Ears, Nose, Mouth, and Throat Lips, teeth, and gums WNL.Marland Kitchen Moist mucosa without lesions. Neck supple and nontender. No palpable supraclavicular or cervical adenopathy. Normal sized without goiter. Respiratory WNL. No  retractions.. Cardiovascular Pedal Pulses WNL. No clubbing, cyanosis or edema. Lymphatic No adneopathy. No adenopathy. No adenopathy. Musculoskeletal Adexa without tenderness or enlargement.. Digits and nails w/o clubbing, cyanosis, infection, petechiae, ischemia, or inflammatory conditions.. Integumentary (Hair, Skin) No suspicious lesions. No crepitus or fluctuance. No peri-wound warmth or erythema. No masses.Marland Kitchen Psychiatric Judgement  and insight Intact.. No evidence of depression, anxiety, or agitation.. Notes The wound is looking much cleaner today with no debris required sharp debridement. I have used a moist saline gauze to washout the wound nicely. she continues to have lymphedema Electronic Signature(s) Signed: 07/30/2015 3:36:40 PM By: Christin Fudge MD, FACS Entered By: Christin Fudge on 07/30/2015 15:36:39 [name], [name] ([id number]) -------------------------------------------------------------------------------- Physician Orders Details Patient Name: [name] Date of Service: 07/30/2015 3:15 PM Medical Record Number: [id number] Patient Account Number: [id number] Date of Birth/Sex: 09/02/1931 (82 y.o. Female) Treating RN: Cornell Barman Primary Care Physician: Margarita Rana Other Clinician: Referring Physician: Margarita Rana Treating Physician/Extender: Frann Rider in Treatment: 1 Verbal / Phone Orders: Yes Clinician: Cornell Barman Read Back and Verified: Yes Diagnosis Coding Wound Cleansing Wound #2 Left Lower Leg o Cleanse wound with mild soap and water Primary Wound Dressing Wound #2 Left Lower Leg o Aquacel Ag Secondary Dressing Wound #2 Left Lower Leg o Boardered Foam Dressing Dressing Change Frequency Wound #2 Left Lower Leg o Change dressing every other day. Follow-up Appointments Wound #2 Left Lower Leg o Return Appointment in 1 week. Edema Control Wound #2 Left Lower Leg o Support Garment 20-30 mm/Hg pressure to: Electronic  Signature(s) Signed: 07/30/2015 3:39:56 PM By: Christin Fudge MD, FACS Signed: 07/30/2015 4:15:15 PM By: Gretta Cool RN, BSN, Kim RN, BSN Entered By: Gretta Cool, RN, BSN, Kim on 07/30/2015 15:28:57 [name], [name] ([id number]) -------------------------------------------------------------------------------- Problem List Details Patient Name: [name] Date of Service: 07/30/2015 3:15 PM Medical Record Number: [id number] Patient Account Number: [id number] Date of Birth/Sex: 06-02-1931 (82 y.o. 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 Female) Treating RN: Macarthur Critchley Primary Care Physician: Margarita Rana Other Clinician: Referring Physician: Margarita Rana Treating Physician/Extender: Frann Rider in Treatment: 1 Subjective Chief Complaint Information obtained from Patient Patient presents to the wound care center for a consult due non  healing wound to the left lower extremity which she's had for about 3 weeks now History of Present Illness (HPI) The following HPI elements were documented for the patient's wound: Location: lacerated wound to the left lower extremity Quality: Patient reports experiencing a dull pain to affected area(s). Severity: Patient states wound (s) are getting better. Duration: Patient has had the wound for < 3 weeks prior to presenting for treatment Timing: Pain in wound is Intermittent (comes and goes Context: The wound occurred when the patient had a blunt injury which caused a laceration to the left lower extremity Modifying Factors: Other treatment(s) tried include:as only applied a bandage to her leg Associated Signs and Symptoms: Patient reports having increase swelling. 82 year old patient who had an injury to her left lower extremity earlier this year and had been treated by me now comes back  with a new injury to her left lower extremity which she's had for about 3 weeks now. Her past medical history is significant for diabetes mellitus, arthritis, basal cell carcinoma, collagenous colitis, essential hypertension, avitaminosis D. She is also status post abdominal hysterectomy, lumbar discectomy, neck surgery. She is a former smoker and quit in 1979. Of note in the recent past she was seen by Dr. Lucky Cowboy for what seems a sounds like injections of varicose veins. This was only around her ankles and not in the lower extremities. No Doppler studies were done. She was not advised to wear any compression stockings. This time around she hit the door of her car and has a significant hematoma which has gone down with time. She did not have any drainage from the wound. Objective Constitutional Pulse regular. Respirations normal and unlabored. Afebrile. [name], [name] ([id number]) Vitals Time Taken: 5:18 AM, Height: 61 in, Weight: 172 lbs, BMI: 32.5, Temperature: 97.9 F, Pulse: 88 bpm, Respiratory  Rate: 16 breaths/min, Blood Pressure: 136/60 mmHg. Eyes Nonicteric. Reactive to light. Ears, Nose, Mouth, and Throat Lips, teeth, and gums WNL.Marland Kitchen Moist mucosa without lesions. Neck supple and nontender. No palpable supraclavicular or cervical adenopathy. Normal sized without goiter. Respiratory WNL. No retractions.. Cardiovascular Pedal Pulses WNL. No clubbing, cyanosis or edema. Lymphatic No adneopathy. No adenopathy. No adenopathy. Musculoskeletal Adexa without tenderness or enlargement.. Digits and nails w/o clubbing, cyanosis, infection, petechiae, ischemia, or inflammatory conditions.Marland Kitchen Psychiatric Judgement and insight Intact.. No evidence of depression, anxiety, or agitation.. General Notes: The wound is looking much cleaner today with no debris required sharp debridement. I have used a moist saline gauze to washout the wound nicely. she continues to have lymphedema Integumentary (Hair, Skin) No suspicious lesions. No crepitus or fluctuance. No peri-wound warmth or erythema. No masses.. Wound #2 status is Open. Original cause of wound was Trauma. The wound is located on the Left Lower Leg. The wound measures 1.2cm length x 0.5cm width x 0.1cm depth; 0.471cm^2 area and 0.047cm^3 volume. The wound is limited to skin breakdown. There is no tunneling or undermining noted. There is a medium amount of serosanguineous drainage noted. The wound margin is flat and intact. There is large (67-100%) pink granulation within the wound bed. There is no necrotic tissue within the wound bed. The periwound skin appearance exhibited: Dry/Scaly. The periwound skin appearance did not exhibit: Callus, Crepitus, Excoriation, Fluctuance, Friable, Induration, Localized Edema, Rash, Scarring, Maceration, Moist, Atrophie Blanche, Cyanosis, Ecchymosis, Hemosiderin Staining, Mottled, Pallor, Rubor, Erythema. [name], [name] ([id number]) Assessment Active Problems ICD-10 E11.622 - Type 2 diabetes mellitus  with other skin ulcer S81.812A - Laceration without foreign body, left lower leg, initial encounter L97.222 - Non-pressure chronic ulcer of left calf with fat layer exposed I87.312 - Chronic venous hypertension (idiopathic) with ulcer of left lower extremity Plan Wound Cleansing: Wound #2 Left Lower Leg: Cleanse wound with mild soap and water Primary Wound Dressing: Wound #2 Left Lower Leg: Aquacel Ag Secondary Dressing: Wound #2 Left Lower Leg: Boardered Foam Dressing Dressing Change Frequency: Wound #2 Left Lower Leg: Change dressing every other day. Follow-up Appointments: Wound #2 Left Lower Leg: Return Appointment in 1 week. Edema Control: Wound #2 Left Lower Leg: Support Garment 20-30 mm/Hg pressure to: After cleaning the wound well have recommended: 1. skin the wound with soap and water and then applying silver alginate on alternate days. 2. Elevation and exercise 3. Good control of her diabetes mellitus 4. There are compression stockings of the 20-30 mm variety daily from  morning to night and elevate her limbs appropriately while in bed. 5. Regular visits to the wound care center which she is agreeable to do. [name], [name] ([id number]) Electronic Signature(s) Signed: 08/03/2015 4:02:44 PM By: Christin Fudge MD, FACS Previous Signature: 07/30/2015 3:37:50 PM Version By: Christin Fudge MD, FACS Entered By: Christin Fudge on 08/03/2015 16:02:44 [name], [name] ([id number]) -------------------------------------------------------------------------------- SuperBill Details Patient Name: [name] Date of Service: 07/30/2015 Medical Record Number: [id number] Patient Account Number: [id number] Date of Birth/Sex: May 11, 1931 (82 y.o. Female) Treating RN: Cornell Barman Primary Care Physician: Margarita Rana Other Clinician: Referring Physician: Margarita Rana Treating Physician/Extender: Frann Rider in Treatment: 1 Diagnosis Coding ICD-10 Codes Code  Description E11.622 Type 2 diabetes mellitus with other skin ulcer S81.812A Laceration without foreign body, left lower leg, initial encounter L97.222 Non-pressure chronic ulcer of left calf with fat layer exposed I87.312 Chronic venous hypertension (idiopathic) with ulcer of left lower extremity Facility Procedures CPT4 Code: [id number] Description: [phone] - WOUND CARE VISIT-LEV 2 EST PT Modifier: Quantity: 1 Physician Procedures CPT4 Code Description: [id number] - WC PHYS LEVEL 3 - EST PT ICD-10 Description Diagnosis E11.622 Type 2 diabetes mellitus with other skin ulcer S81.812A Laceration without foreign body, left lower leg, initi L97.222 Non-pressure chronic ulcer of left  calf with fat layer I87.312 Chronic venous hypertension (idiopathic) with ulcer of Modifier: al encounter exposed left lower Quantity: 1 extremity Electronic Signature(s) Signed: 07/30/2015 3:38:06 PM By: Christin Fudge MD, FACS Entered By: Christin Fudge on 07/30/2015 15:38:06

## 2015-08-09 ENCOUNTER — Encounter: Payer: Medicare Other | Attending: Surgery | Admitting: Surgery

## 2015-08-09 DIAGNOSIS — I10 Essential (primary) hypertension: Secondary | ICD-10-CM | POA: Insufficient documentation

## 2015-08-09 DIAGNOSIS — S81812A Laceration without foreign body, left lower leg, initial encounter: Secondary | ICD-10-CM | POA: Diagnosis not present

## 2015-08-09 DIAGNOSIS — I87312 Chronic venous hypertension (idiopathic) with ulcer of left lower extremity: Secondary | ICD-10-CM | POA: Insufficient documentation

## 2015-08-09 DIAGNOSIS — X58XXXA Exposure to other specified factors, initial encounter: Secondary | ICD-10-CM | POA: Diagnosis not present

## 2015-08-09 DIAGNOSIS — L97222 Non-pressure chronic ulcer of left calf with fat layer exposed: Secondary | ICD-10-CM | POA: Diagnosis not present

## 2015-08-09 DIAGNOSIS — Z87891 Personal history of nicotine dependence: Secondary | ICD-10-CM | POA: Insufficient documentation

## 2015-08-09 DIAGNOSIS — E11622 Type 2 diabetes mellitus with other skin ulcer: Secondary | ICD-10-CM | POA: Insufficient documentation

## 2015-08-09 DIAGNOSIS — E118 Type 2 diabetes mellitus with unspecified complications: Secondary | ICD-10-CM | POA: Diagnosis not present

## 2015-08-09 DIAGNOSIS — M1990 Unspecified osteoarthritis, unspecified site: Secondary | ICD-10-CM | POA: Diagnosis not present

## 2015-08-09 DIAGNOSIS — S81802D Unspecified open wound, left lower leg, subsequent encounter: Secondary | ICD-10-CM | POA: Diagnosis not present

## 2015-08-10 NOTE — Progress Notes (Addendum)
CARICE, CONEY ([id number]) Visit Report for 08/09/2015 Arrival Information Details Patient Name: MAILEE, CLAYDON Date of Service: 08/09/2015 2:15 PM Medical Record Number: [id number] Patient Account Number: [id number] Date of Birth/Sex: 04-26-1931 (82 y.o. Female) Treating RN: Cornell Barman Primary Care Physician: [name] Other Clinician: Referring Physician: Cranford Mon, Delfino Lovett Treating Physician/Extender: Frann Rider in Treatment: 2 Visit Information History Since Last Visit Added or deleted any medications: No Patient Arrived: Ambulatory Any new allergies or adverse reactions: No Arrival Time: 14:30 Had a fall or experienced change in No Accompanied By: self activities of daily living that may affect Transfer Assistance: None risk of falls: Patient Identification Verified: Yes Signs or symptoms of abuse/neglect since last No Secondary Verification Process Yes visito Completed: Hospitalized since last visit: No Patient Requires Transmission- No Has Dressing in Place as Prescribed: Yes Based Precautions: Pain Present Now: No Patient Has Alerts: Yes Patient Alerts: Patient on Blood Thinner 03/20/15 ABI (L) 1.04 Electronic Signature(s) Signed: 08/09/2015 5:33:54 PM By: Gretta Cool, RN, BSN, Kim RN, BSN Entered By: Gretta Cool, RN, BSN, Kim on 08/09/2015 14:30:24 [name] ([id number]) -------------------------------------------------------------------------------- Clinic Level of Care Assessment Details Patient Name: [name] Date of Service: 08/09/2015 2:15 PM Medical Record Number: [id number] Patient Account Number: [id number] Date of Birth/Sex: Apr 08, 1931 (82 y.o. Female) Treating RN: Cornell Barman Primary Care Physician: [name] Other Clinician: Referring Physician: Cranford Mon, RICHARD Treating Physician/Extender: Frann Rider in Treatment: 2 Clinic Level of Care Assessment Items TOOL 4 Quantity Score []  - Use when only an EandM is  performed on FOLLOW-UP visit 0 ASSESSMENTS - Nursing Assessment / Reassessment []  - Reassessment of Co-morbidities (includes updates in patient status) 0 X - Reassessment of Adherence to Treatment Plan 1 5 ASSESSMENTS - Wound and Skin Assessment / Reassessment X - Simple Wound Assessment / Reassessment - one wound 1 5 []  - Complex Wound Assessment / Reassessment - multiple wounds 0 []  - Dermatologic / Skin Assessment (not related to wound area) 0 ASSESSMENTS - Focused Assessment []  - Circumferential Edema Measurements - multi extremities 0 []  - Nutritional Assessment / Counseling / Intervention 0 []  - Lower Extremity Assessment (monofilament, tuning fork, pulses) 0 []  - Peripheral Arterial Disease Assessment (using hand held doppler) 0 ASSESSMENTS - Ostomy and/or Continence Assessment and Care []  - Incontinence Assessment and Management 0 []  - Ostomy Care Assessment and Management (repouching, etc.) 0 PROCESS - Coordination of Care X - Simple Patient / Family Education for ongoing care 1 15 []  - Complex (extensive) Patient / Family Education for ongoing care 0 []  - Staff obtains Programmer, systems, Records, Test Results / Process Orders 0 []  - Staff telephones HHA, Nursing Homes / Clarify orders / etc 0 []  - Routine Transfer to another Facility (non-emergent condition) 0 Corniel, Rudene R. ([id number]) []  - Routine Hospital Admission (non-emergent condition) 0 []  - New Admissions / Biomedical engineer / Ordering NPWT, Apligraf, etc. 0 []  - Emergency Hospital Admission (emergent condition) 0 X - Simple Discharge Coordination 1 10 []  - Complex (extensive) Discharge Coordination 0 PROCESS - Special Needs []  - Pediatric / Minor Patient Management 0 []  - Isolation Patient Management 0 []  - Hearing / Language / Visual special needs 0 []  - Assessment of Community assistance (transportation, D/C planning, etc.) 0 []  - Additional assistance / Altered mentation 0 []  - Support Surface(s) Assessment  (bed, cushion, seat, etc.) 0 INTERVENTIONS - Wound Cleansing / Measurement X - Simple Wound Cleansing - one wound 1 5 []  - Complex Wound Cleansing -  multiple wounds 0 X - Wound Imaging (photographs - any number of wounds) 1 5 []  - Wound Tracing (instead of photographs) 0 X - Simple Wound Measurement - one wound 1 5 []  - Complex Wound Measurement - multiple wounds 0 INTERVENTIONS - Wound Dressings X - Small Wound Dressing one or multiple wounds 1 10 []  - Medium Wound Dressing one or multiple wounds 0 []  - Large Wound Dressing one or multiple wounds 0 []  - Application of Medications - topical 0 []  - Application of Medications - injection 0 INTERVENTIONS - Miscellaneous []  - External ear exam 0 Chirico, Kawanda R. ([id number]) []  - Specimen Collection (cultures, biopsies, blood, body fluids, etc.) 0 []  - Specimen(s) / Culture(s) sent or taken to Lab for analysis 0 []  - Patient Transfer (multiple staff / Harrel Lemon Lift / Similar devices) 0 []  - Simple Staple / Suture removal (25 or less) 0 []  - Complex Staple / Suture removal (26 or more) 0 []  - Hypo / Hyperglycemic Management (close monitor of Blood Glucose) 0 []  - Ankle / Brachial Index (ABI) - do not check if billed separately 0 X - Vital Signs 1 5 Has the patient been seen at the hospital within the last three years: Yes Total Score: 65 Level Of Care: New/Established - Level 2 Electronic Signature(s) Signed: 08/09/2015 5:33:54 PM By: Gretta Cool, RN, BSN, Kim RN, BSN Entered By: Gretta Cool, RN, BSN, Kim on 08/09/2015 14:40:55 [name] ([id number]) -------------------------------------------------------------------------------- Encounter Discharge Information Details Patient Name: [name] Date of Service: 08/09/2015 2:15 PM Medical Record Number: [id number] Patient Account Number: [id number] Date of Birth/Sex: November 15, 1931 (82 y.o. Female) Treating RN: Cornell Barman Primary Care Physician: [name] Other Clinician: Referring  Physician: Cranford Mon, RICHARD Treating Physician/Extender: Frann Rider in Treatment: 2 Encounter Discharge Information Items Discharge Pain Level: 0 Discharge Condition: Stable Ambulatory Status: Ambulatory Discharge Destination: Home Transportation: Private Auto Accompanied By: self Schedule Follow-up Appointment: Yes Medication Reconciliation completed and provided to Patient/Care Yes Rayson Rando: Provided on Clinical Summary of Care: 08/09/2015 Form Type Recipient Paper Patient [facility] Electronic Signature(s) Signed: 08/09/2015 2:46:03 PM By: Ruthine Dose Entered By: Ruthine Dose on 08/09/2015 14:46:02 Pelaez, Clemens Catholic ([id number]) -------------------------------------------------------------------------------- Lower Extremity Assessment Details Patient Name: [name] Date of Service: 08/09/2015 2:15 PM Medical Record Number: [id number] Patient Account Number: [id number] Date of Birth/Sex: 27-Oct-1931 (82 y.o. Female) Treating RN: Cornell Barman Primary Care Physician: [name] Other Clinician: Referring Physician: Cranford Mon, RICHARD Treating Physician/Extender: Frann Rider in Treatment: 2 Edema Assessment Assessed: [Left: No] [Right: No] E[Left: dema] [Right: :] Calf Left: Right: Point of Measurement: 28 cm From Medial Instep 36.2 cm cm Ankle Left: Right: Point of Measurement: 8 cm From Medial Instep 22 cm cm Vascular Assessment Pulses: Posterior Tibial Dorsalis Pedis Palpable: [Left:Yes] Extremity colors, hair growth, and conditions: Extremity Color: [Left:Normal] Hair Growth on Extremity: [Left:Yes] Temperature of Extremity: [Left:Warm] Capillary Refill: [Left:< 3 seconds] Toe Nail Assessment Left: Right: Thick: No Discolored: No Deformed: No Improper Length and Hygiene: No Electronic Signature(s) Signed: 08/09/2015 5:33:54 PM By: Gretta Cool, RN, BSN, Kim RN, BSN Entered By: Gretta Cool, RN, BSN, Kim on 08/09/2015 14:33:36 Rohrbach, Clemens Catholic  ([id number]) -------------------------------------------------------------------------------- Multi Wound Chart Details Patient Name: [name] Date of Service: 08/09/2015 2:15 PM Medical Record Number: [id number] Patient Account Number: [id number] Date of Birth/Sex: Oct 09, 1931 (82 y.o. Female) Treating RN: Cornell Barman Primary Care Physician: [name] Other Clinician: Referring Physician: Cranford Mon, RICHARD Treating Physician/Extender: Frann Rider in Treatment: 2 Vital Signs Height(in):  61 Pulse(bpm): 77 Weight(lbs): 172 Blood Pressure 133/54 (mmHg): Body Mass Index(BMI): 32 Temperature(F): 97.7 Respiratory Rate 18 (breaths/min): Photos: [2:No Photos] [N/A:N/A] Wound Location: [2:Left Lower Leg] [N/A:N/A] Wounding Event: [2:Trauma] [N/A:N/A] Primary Etiology: [2:Trauma, Other] [N/A:N/A] Secondary Etiology: [2:Diabetic Wound/Ulcer of the Lower Extremity] [N/A:N/A] Comorbid History: [2:Hypertension, Colitis, Type II Diabetes, Osteoarthritis] [N/A:N/A] Date Acquired: [2:07/09/2015] [N/A:N/A] Weeks of Treatment: [2:2] [N/A:N/A] Wound Status: [2:Open] [N/A:N/A] Measurements L x W x D 0.1x0.1x0.1 [N/A:N/A] (cm) Area (cm) : [2:0.008] [N/A:N/A] Volume (cm) : [2:0.001] [N/A:N/A] % Reduction in Area: [2:98.80%] [N/A:N/A] % Reduction in Volume: 99.30% [N/A:N/A] Classification: [2:Partial Thickness] [N/A:N/A] HBO Classification: [2:Grade 0] [N/A:N/A] Exudate Amount: [2:None Present] [N/A:N/A] Wound Margin: [2:Flat and Intact] [N/A:N/A] Granulation Amount: [2:Large (67-100%)] [N/A:N/A] Granulation Quality: [2:Pink] [N/A:N/A] Necrotic Amount: [2:None Present (0%)] [N/A:N/A] Exposed Structures: [2:Fascia: No Fat: No Tendon: No Muscle: No Joint: No] [N/A:N/A] Bone: No Limited to Skin Breakdown Epithelialization: Small (1-33%) N/A N/A Periwound Skin Texture: Edema: No N/A N/A Excoriation: No Induration: No Callus: No Crepitus: No Fluctuance:  No Friable: No Rash: No Scarring: No Periwound Skin Dry/Scaly: Yes N/A N/A Moisture: Maceration: No Moist: No Periwound Skin Color: Atrophie Blanche: No N/A N/A Cyanosis: No Ecchymosis: No Erythema: No Hemosiderin Staining: No Mottled: No Pallor: No Rubor: No Tenderness on No N/A N/A Palpation: Wound Preparation: Ulcer Cleansing: N/A N/A Rinsed/Irrigated with Saline Topical Anesthetic Applied: None Treatment Notes Electronic Signature(s) Signed: 08/09/2015 5:33:54 PM By: Gretta Cool, RN, BSN, Kim RN, BSN Entered By: Gretta Cool, RN, BSN, Kim on 08/09/2015 14:39:29 IMANII, ROOD ([id number]) -------------------------------------------------------------------------------- [place] Details Patient Name: [name] Date of Service: 08/09/2015 2:15 PM Medical Record Number: [id number] Patient Account Number: [id number] Date of Birth/Sex: 03/25/1931 (82 y.o. Female) Treating RN: Cornell Barman Primary Care Physician: [name] Other Clinician: Referring Physician: Cranford Mon, RICHARD Treating Physician/Extender: Frann Rider in Treatment: 2 Active Inactive Electronic Signature(s) Signed: 11/06/2015 1:42:50 PM By: Gretta Cool RN, BSN, Kim RN, BSN Previous Signature: 08/09/2015 5:33:54 PM Version By: Gretta Cool RN, BSN, Kim RN, BSN Entered By: Gretta Cool, RN, BSN, Kim on 08/16/2015 15:10:38 KAITLYNE, LACROSSE ([id number]) -------------------------------------------------------------------------------- Pain Assessment Details Patient Name: [name] Date of Service: 08/09/2015 2:15 PM Medical Record Number: [id number] Patient Account Number: [id number] Date of Birth/Sex: 23-Mar-1931 (82 y.o. Female) Treating RN: Cornell Barman Primary Care Physician: [name] Other Clinician: Referring Physician: Wilhemena Durie Treating Physician/Extender: Frann Rider in Treatment: 2 Active Problems Location of Pain Severity and Description of  Pain Patient Has Paino No Site Locations With Dressing Change: No Pain Management and Medication Current Pain Management: Electronic Signature(s) Signed: 08/09/2015 5:33:54 PM By: Gretta Cool, RN, BSN, Kim RN, BSN Entered By: Gretta Cool, RN, BSN, Kim on 08/09/2015 14:31:04 TALYIAH, TERPENNING ([id number]) -------------------------------------------------------------------------------- Patient/Caregiver Education Details Patient Name: [name] Date of Service: 08/09/2015 2:15 PM Medical Record Number: [id number] Patient Account Number: [id number] Date of Birth/Gender: 1931-10-18 (82 y.o. Female) Treating RN: Cornell Barman Primary Care Physician: [name] Other Clinician: Referring Physician: Wilhemena Durie Treating Physician/Extender: Frann Rider in Treatment: 2 Education Assessment Education Provided To: Patient Education Topics Provided Venous: Handouts: Controlling Swelling with Compression Stockings , Other: wear compression stockings daily Methods: Demonstration, Explain/Verbal Responses: State content correctly Electronic Signature(s) Signed: 08/09/2015 5:33:54 PM By: Gretta Cool, RN, BSN, Kim RN, BSN Entered By: Gretta Cool, RN, BSN, Kim on 08/09/2015 14:46:06 [name] ([id number]) -------------------------------------------------------------------------------- Wound Assessment Details Patient Name: [name] Date of Service: 08/09/2015 2:15 PM Medical Record Number: [id number] Patient Account Number: [id number]  Date of Birth/Sex: 1931-12-29 (82 y.o. Female) Treating RN: Cornell Barman Primary Care Physician: [name] Other Clinician: Referring Physician: Cranford Mon, RICHARD Treating Physician/Extender: Frann Rider in Treatment: 2 Wound Status Wound Number: 2 Primary Trauma, Other Etiology: Wound Location: Left Lower Leg Secondary Diabetic Wound/Ulcer of the Lower Wounding Event: Trauma Etiology: Extremity Date Acquired: 07/09/2015 Wound  Status: Healed - Epithelialized Weeks Of Treatment: 2 Comorbid Hypertension, Colitis, Type II Clustered Wound: No History: Diabetes, Osteoarthritis Photos Photo Uploaded By: Gretta Cool, RN, BSN, Kim on 08/09/2015 16:20:53 Wound Measurements Length: (cm) 0 % Reduction Width: (cm) 0 % Reduction Depth: (cm) 0 Epitheliali Area: (cm) 0 Volume: (cm) 0 in Area: 100% in Volume: 100% zation: Small (1-33%) Wound Description Classification: Partial Thickness Foul Odor A Diabetic Severity (Wagner): Grade 0 Wound Margin: Flat and Intact Exudate Amount: None Present fter Cleansing: No Wound Bed Granulation Amount: Large (67-100%) Exposed Structure Granulation Quality: Pink Fascia Exposed: No Necrotic Amount: None Present (0%) Fat Layer Exposed: No Tendon Exposed: No Muscle Exposed: No Gallo, Kalene R. ([id number]) Joint Exposed: No Bone Exposed: No Limited to Skin Breakdown Periwound Skin Texture Texture Color No Abnormalities Noted: No No Abnormalities Noted: No Callus: No Atrophie Blanche: No Crepitus: No Cyanosis: No Excoriation: No Ecchymosis: No Fluctuance: No Erythema: No Friable: No Hemosiderin Staining: No Induration: No Mottled: No Localized Edema: No Pallor: No Rash: No Rubor: No Scarring: No Moisture No Abnormalities Noted: No Dry / Scaly: Yes Maceration: No Moist: No Wound Preparation Ulcer Cleansing: Rinsed/Irrigated with Saline Topical Anesthetic Applied: None Treatment Notes Wound #2 (Left Lower Leg) 5. Secondary Dressing Applied Bordered Foam Dressing Electronic Signature(s) Signed: 11/06/2015 1:42:50 PM By: Gretta Cool RN, BSN, Kim RN, BSN Previous Signature: 08/09/2015 5:33:54 PM Version By: Gretta Cool RN, BSN, Kim RN, BSN Entered By: Gretta Cool, RN, BSN, Kim on 08/16/2015 15:11:04 CARMYNN, BUIE ([id number]) -------------------------------------------------------------------------------- [place] Details Patient Name: [name] Date of Service:  08/09/2015 2:15 PM Medical Record Number: [id number] Patient Account Number: [id number] Date of Birth/Sex: March 17, 1931 (82 y.o. Female) Treating RN: Cornell Barman Primary Care Physician: [name] Other Clinician: Referring Physician: Cranford Mon, RICHARD Treating Physician/Extender: Frann Rider in Treatment: 2 Vital Signs Time Taken: 14:31 Temperature (F): 97.7 Height (in): 61 Pulse (bpm): 77 Weight (lbs): 172 Respiratory Rate (breaths/min): 18 Body Mass Index (BMI): 32.5 Blood Pressure (mmHg): 133/54 Reference Range: 80 - 120 mg / dl Electronic Signature(s) Signed: 08/09/2015 5:33:54 PM By: Gretta Cool, RN, BSN, Kim RN, BSN Entered By: Gretta Cool, RN, BSN, Kim on 08/09/2015 14:31:27

## 2015-08-10 NOTE — Progress Notes (Addendum)
BETTYMAE, SCHLAFF ([id number]) Visit Report for 08/09/2015 Chief Complaint Document Details Patient Name: [name], [name] Date of Service: 08/09/2015 2:15 PM Medical Record Number: [id number] Patient Account Number: [id number] Date of Birth/Sex: 03-02-1932 (82 y.o. Female) Treating RN: Cornell Barman Primary Care Physician: Margarita Rana Other Clinician: Referring Physician: Margarita Rana Treating Physician/Extender: Frann Rider in Treatment: 2 Information Obtained from: Patient Chief Complaint Patient presents to the wound care center for a consult due non healing wound to the left lower extremity which she's had for about 3 weeks now Electronic Signature(s) Signed: 08/09/2015 2:43:31 PM By: Christin Fudge MD, FACS Entered By: Christin Fudge on 08/09/2015 14:43:31 [name], [name] ([id number]) -------------------------------------------------------------------------------- HPI Details Patient Name: [name] Date of Service: 08/09/2015 2:15 PM Medical Record Number: [id number] Patient Account Number: [id number] Date of Birth/Sex: 09-May-1931 (82 y.o. Female) Treating RN: Cornell Barman Primary Care Physician: Margarita Rana Other Clinician: Referring Physician: Margarita Rana Treating Physician/Extender: Frann Rider in Treatment: 2 History of Present Illness Location: lacerated wound to the left lower extremity Quality: Patient reports experiencing a dull pain to affected area(s). Severity: Patient states wound (s) are getting better. Duration: Patient has had the wound for < 3 weeks prior to presenting for treatment Timing: Pain in wound is Intermittent (comes and goes Context: The wound occurred when the patient had a blunt injury which caused a laceration to the left lower extremity Modifying Factors: Other treatment(s) tried include:as only applied a bandage to her leg Associated Signs and Symptoms: Patient reports having increase swelling. HPI Description:  82 year old patient who had an injury to her left lower extremity earlier this year and had been treated by me now comes back with a new injury to her left lower extremity which she's had for about 3 weeks now. Her past medical history is significant for diabetes mellitus, arthritis, basal cell carcinoma, collagenous colitis, essential hypertension, avitaminosis D. She is also status post abdominal hysterectomy, lumbar discectomy, neck surgery. She is a former smoker and quit in 1979. Of note in the recent past she was seen by Dr. Lucky Cowboy for what seems a sounds like injections of varicose veins. This was only around her ankles and not in the lower extremities. No Doppler studies were done. She was not advised to wear any compression stockings. This time around she hit the door of her car and has a significant hematoma which has gone down with time. She did not have any drainage from the wound. Electronic Signature(s) Signed: 08/09/2015 2:43:36 PM By: Christin Fudge MD, FACS Entered By: Christin Fudge on 08/09/2015 14:43:36 [name], [name] ([id number]) -------------------------------------------------------------------------------- Physical Exam Details Patient Name: [name] Date of Service: 08/09/2015 2:15 PM Medical Record Number: [id number] Patient Account Number: [id number] Date of Birth/Sex: 04-Apr-1931 (82 y.o. Female) Treating RN: Cornell Barman Primary Care Physician: Margarita Rana Other Clinician: Referring Physician: Margarita Rana Treating Physician/Extender: Frann Rider in Treatment: 2 Constitutional . Pulse regular. Respirations normal and unlabored. Afebrile. . Eyes Nonicteric. Reactive to light. Ears, Nose, Mouth, and Throat Lips, teeth, and gums WNL.Marland Kitchen Moist mucosa without lesions. Neck supple and nontender. No palpable supraclavicular or cervical adenopathy. Normal sized without goiter. Respiratory WNL. No retractions.. Breath sounds WNL, No rubs, rales,  rhonchi, or wheeze.. Cardiovascular Heart rhythm and rate regular, no murmur or gallop.. Pedal Pulses WNL. No clubbing, cyanosis or edema. Lymphatic No adneopathy. No adenopathy. No adenopathy. Musculoskeletal Adexa without tenderness or enlargement.. Digits and nails w/o clubbing, cyanosis, infection, petechiae, ischemia, or inflammatory conditions.. Integumentary (  Hair, Skin) No suspicious lesions. No crepitus or fluctuance. No peri-wound warmth or erythema. No masses.Marland Kitchen Psychiatric Judgement and insight Intact.. No evidence of depression, anxiety, or agitation.. Notes the wound is almost completely healed with a small pinpoint area which is possibly open but I cannot for sure tell if it is epithelialized. Electronic Signature(s) Signed: 08/09/2015 2:44:24 PM By: Christin Fudge MD, FACS Entered By: Christin Fudge on 08/09/2015 14:44:24 [name], [name] ([id number]) -------------------------------------------------------------------------------- Physician Orders Details Patient Name: [name] Date of Service: 08/09/2015 2:15 PM Medical Record Number: [id number] Patient Account Number: [id number] Date of Birth/Sex: November 13, 1931 (82 y.o. Female) Treating RN: Cornell Barman Primary Care Physician: Margarita Rana Other Clinician: Referring Physician: Margarita Rana Treating Physician/Extender: Frann Rider in Treatment: 2 Verbal / Phone Orders: Yes Clinician: Cornell Barman Read Back and Verified: Yes Diagnosis Coding Wound Cleansing Wound #2 Left Lower Leg o Cleanse wound with mild soap and water Secondary Dressing Wound #2 Left Lower Leg o Boardered Foam Dressing Follow-up Appointments Wound #2 Left Lower Leg o Return Appointment in 1 week. Edema Control Wound #2 Left Lower Leg o Support Garment 20-30 mm/Hg pressure to: Discharge From [facility] Wound #2 Left Lower Leg o Discharge from [place] Signature(s) Signed: 08/09/2015 4:11:18 PM  By: Christin Fudge MD, FACS Signed: 08/09/2015 5:33:54 PM By: Gretta Cool RN, BSN, Kim RN, BSN Entered By: Gretta Cool, RN, BSN, Kim on 08/09/2015 14:40:26 [name], [name] ([id number]) -------------------------------------------------------------------------------- Problem List Details Patient Name: [name] Date of Service: 08/09/2015 2:15 PM Medical Record Number: [id number] Patient Account Number: [id number] Date of Birth/Sex: 1931-07-02 (82 y.o. 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 Female) Treating RN: Cornell Barman Primary Care Physician: Margarita Rana Other Clinician: Referring Physician: Margarita Rana Treating Physician/Extender: Frann Rider in Treatment: 2 Subjective Chief Complaint Information obtained from Patient Patient presents to the wound care center for a consult due non healing wound to the left lower extremity which she's had for  about 3 weeks now History of Present Illness (HPI) The following HPI elements were documented for the patient's wound: Location: lacerated wound to the left lower extremity Quality: Patient reports experiencing a dull pain to affected area(s). Severity: Patient states wound (s) are getting better. Duration: Patient has had the wound for < 3 weeks prior to presenting for treatment Timing: Pain in wound is Intermittent (comes and goes Context: The wound occurred when the patient had a blunt injury which caused a laceration to the left lower extremity Modifying Factors: Other treatment(s) tried include:as only applied a bandage to her leg Associated Signs and Symptoms: Patient reports having increase swelling. 82 year old patient who had an injury to her left lower extremity earlier this year and had been treated by me  now comes back with a new injury to her left lower extremity which she's had for about 3 weeks now. Her past medical history is significant for diabetes mellitus, arthritis, basal cell carcinoma, collagenous colitis, essential hypertension, avitaminosis D. She is also status post abdominal hysterectomy, lumbar discectomy, neck surgery. She is a former smoker and quit in 1979. Of note in the recent past she was seen by Dr. Lucky Cowboy for what seems a sounds like injections of varicose veins. This was only around her ankles and not in the lower extremities. No Doppler studies were done. She was not advised to wear any compression stockings. This time around she hit the door of her car and has a significant hematoma which has gone down with time. She did not have any drainage from the wound. Objective Constitutional Pulse regular. Respirations normal and unlabored. Afebrile. [name], [name] ([id number]) Vitals Time Taken: 2:31 PM, Height: 61 in, Weight: 172 lbs, BMI: 32.5, Temperature: 97.7 F, Pulse: 77 bpm, Respiratory Rate: 18 breaths/min, Blood Pressure: 133/54  mmHg. Eyes Nonicteric. Reactive to light. Ears, Nose, Mouth, and Throat Lips, teeth, and gums WNL.Marland Kitchen Moist mucosa without lesions. Neck supple and nontender. No palpable supraclavicular or cervical adenopathy. Normal sized without goiter. Respiratory WNL. No retractions.. Breath sounds WNL, No rubs, rales, rhonchi, or wheeze.. Cardiovascular Heart rhythm and rate regular, no murmur or gallop.. Pedal Pulses WNL. No clubbing, cyanosis or edema. Lymphatic No adneopathy. No adenopathy. No adenopathy. Musculoskeletal Adexa without tenderness or enlargement.. Digits and nails w/o clubbing, cyanosis, infection, petechiae, ischemia, or inflammatory conditions.Marland Kitchen Psychiatric Judgement and insight Intact.. No evidence of depression, anxiety, or agitation.. General Notes: the wound is almost completely healed with a small pinpoint area which is possibly open but I cannot for sure tell if it is epithelialized. Integumentary (Hair, Skin) No suspicious lesions. No crepitus or fluctuance. No peri-wound warmth or erythema. No masses.. Wound #2 status is Healed - Epithelialized. Original cause of wound was Trauma. The wound is located on the Left Lower Leg. The wound measures 0cm length x 0cm width x 0cm depth; 0cm^2 area and 0cm^3 volume. The wound is limited to skin breakdown. There is a none present amount of drainage noted. The wound margin is flat and intact. There is large (67-100%) pink granulation within the wound bed. There is no necrotic tissue within the wound bed. The periwound skin appearance exhibited: Dry/Scaly. The periwound skin appearance did not exhibit: Callus, Crepitus, Excoriation, Fluctuance, Friable, Induration, Localized Edema, Rash, Scarring, Maceration, Moist, Atrophie Blanche, Cyanosis, Ecchymosis, Hemosiderin Staining, Mottled, Pallor, Rubor, Erythema. [name], [name] ([id number]) Assessment Active Problems ICD-10 E11.622 - Type 2 diabetes mellitus with other skin  ulcer S81.812A - Laceration without foreign body, left lower leg, initial encounter L97.222 - Non-pressure chronic ulcer of left calf with fat layer exposed I87.312 - Chronic venous hypertension (idiopathic) with ulcer of left lower extremity Plan Wound Cleansing: Wound #2 Left Lower Leg: Cleanse wound with mild soap and water Secondary Dressing: Wound #2 Left Lower Leg: Boardered Foam Dressing Follow-up Appointments: Wound #2 Left Lower Leg: Return Appointment in 1 week. Edema Control: Wound #2 Left Lower Leg: Support Garment 20-30 mm/Hg pressure to: Discharge From [facility] Services: Wound #2 Left Lower Leg: Discharge from [place] After reviewing the wound well have recommended: 1. apply a bordered foam to cover this wound, and leave it intact for as long as possible 2. Elevation and exercise 3. Good control of her diabetes mellitus 4. There are compression stockings of the 20-30  mm variety daily from morning to night and elevate her limbs appropriately while in bed. 5. she is discharged from the wound care services with the provision that if this continues to drain, or have any problems she can come back for review. [name], [name] ([id number]) Electronic Signature(s) Signed: 08/16/2015 3:43:30 PM By: Christin Fudge MD, FACS Previous Signature: 08/09/2015 2:45:59 PM Version By: Christin Fudge MD, FACS Entered By: Christin Fudge on 08/16/2015 15:43:29 [name], [name] ([id number]) -------------------------------------------------------------------------------- SuperBill Details Patient Name: [name] Date of Service: 08/09/2015 Medical Record Number: [id number] Patient Account Number: [id number] Date of Birth/Sex: January 24, 1932 (82 y.o. Female) Treating RN: Cornell Barman Primary Care Physician: Margarita Rana Other Clinician: Referring Physician: Margarita Rana Treating Physician/Extender: Frann Rider in Treatment: 2 Diagnosis Coding ICD-10 Codes Code  Description E11.622 Type 2 diabetes mellitus with other skin ulcer S81.812A Laceration without foreign body, left lower leg, initial encounter L97.222 Non-pressure chronic ulcer of left calf with fat layer exposed I87.312 Chronic venous hypertension (idiopathic) with ulcer of left lower extremity Facility Procedures CPT4 Code: [id number] Description: [phone] - WOUND CARE VISIT-LEV 2 EST PT Modifier: Quantity: 1 Physician Procedures CPT4 Code Description: [id number] - WC PHYS LEVEL 3 - EST PT ICD-10 Description Diagnosis E11.622 Type 2 diabetes mellitus with other skin ulcer S81.812A Laceration without foreign body, left lower leg, initi L97.222 Non-pressure chronic ulcer of left  calf with fat layer I87.312 Chronic venous hypertension (idiopathic) with ulcer of Modifier: al encounter exposed left lower Quantity: 1 extremity Electronic Signature(s) Signed: 08/09/2015 2:46:13 PM By: Christin Fudge MD, FACS Entered By: Christin Fudge on 08/09/2015 14:46:13

## 2015-08-15 ENCOUNTER — Ambulatory Visit (INDEPENDENT_AMBULATORY_CARE_PROVIDER_SITE_OTHER): Payer: Medicare Other | Admitting: Family Medicine

## 2015-08-15 ENCOUNTER — Encounter: Payer: Self-pay | Admitting: Family Medicine

## 2015-08-15 VITALS — BP 128/86 | HR 68 | Temp 97.9°F | Resp 16 | Wt 179.0 lb

## 2015-08-15 DIAGNOSIS — D692 Other nonthrombocytopenic purpura: Secondary | ICD-10-CM | POA: Diagnosis not present

## 2015-08-15 DIAGNOSIS — E7800 Pure hypercholesterolemia, unspecified: Secondary | ICD-10-CM

## 2015-08-15 DIAGNOSIS — I10 Essential (primary) hypertension: Secondary | ICD-10-CM | POA: Diagnosis not present

## 2015-08-15 DIAGNOSIS — E119 Type 2 diabetes mellitus without complications: Secondary | ICD-10-CM | POA: Diagnosis not present

## 2015-08-15 LAB — POCT GLYCOSYLATED HEMOGLOBIN (HGB A1C): HEMOGLOBIN A1C: 9.1

## 2015-08-15 MED ORDER — GLIPIZIDE ER 2.5 MG PO TB24: 2.5000 mg | ORAL_TABLET | Freq: Every day | ORAL | Status: DC

## 2015-08-15 MED ORDER — METFORMIN HCL 500 MG PO TABS: 500.0000 mg | ORAL_TABLET | Freq: Two times a day (BID) | ORAL | Status: DC

## 2015-08-15 NOTE — Progress Notes (Signed)
Patient ID: [name], female   DOB: Jul 14, 1931, 82 y.o.   MRN: [id number]        [name]    DOB: 1932-03-01   82 y.o.   MRN: [id number] Visit Date: 08/15/2015  Today's Provider: Margarita Rana, MD   Chief Complaint  Patient presents with  . Hypertension  . Diabetes  . Hyperlipidemia   Subjective:    Hypertension This is a chronic problem. The current episode started more than 1 year ago. The problem is unchanged. The problem is controlled. Pertinent negatives include no chest pain, headaches or palpitations. The current treatment provides significant improvement. There are no compliance problems.  There is no history of angina or CVA.  Hyperlipidemia This is a chronic problem. The current episode started more than 1 year ago. The problem is controlled. Exacerbating diseases include obesity. Pertinent negatives include no chest pain. Current antihyperlipidemic treatment includes statins. The current treatment provides moderate improvement of lipids. There are no compliance problems.   Diabetes She presents for her follow-up diabetic visit. She has type 2 diabetes mellitus. Her disease course has been worsening. There are no hypoglycemic associated symptoms. Pertinent negatives for hypoglycemia include no dizziness or headaches. Pertinent negatives for diabetes include no chest pain. There are no hypoglycemic complications. Symptoms are stable. Pertinent negatives for diabetic complications include no CVA. Current diabetic treatment includes diet and oral agent (monotherapy).    Also concerned about her bruising.      Allergies  Allergen Reactions  . Morphine Sulfate   . Penicillins Diarrhea   Previous Medications   ASPIRIN 81 MG TABLET    Take by mouth.   BLOOD GLUCOSE MONITORING SUPPL (ACCU-CHEK AVIVA CONNECT) W/DEVICE KIT    ACCU-CHEK AVIVA (In Vitro Strip)  1 (one) Strip Strip daily for 0 days  Quantity: 100;  Refills: 3   Ordered :24-Oct-2013   Lynford Humphrey ;  Started 24-Oct-2013 Active Comments: Dx: 250.00 also include Accu-chek aviva meter   BUDESONIDE (ENTOCORT EC) 3 MG 24 HR CAPSULE    TAKE 3  CAPSULES  ORAL, DAILY   CALCIUM-VITAMIN D 600-200 MG-UNIT PER TABLET    Take by mouth.   CHOLECALCIFEROL (VITAMIN D) 2000 UNITS CAPS    Take by mouth.   GLUCOSE BLOOD TEST STRIP    BAYER CONTOUR TEST (In Vitro Strip)  1 (one) Strip Strip to check blood sugar daily for 0 days  Quantity: 50;  Refills: 5   Ordered :20-May-2013  Gerald Leitz ;  Started 20-May-2013 Active Comments: & Lancets   LISINOPRIL-HYDROCHLOROTHIAZIDE (PRINZIDE,ZESTORETIC) 10-12.5 MG TABLET    1 (ONE) TABLET, ORAL, DAILY   METFORMIN (GLUCOPHAGE) 500 MG TABLET    TAKE 1 TABLET, ORAL, TWO TIMES DAILY   MULTIPLE VITAMIN TABLET    Take by mouth. Reported on 03/13/2015   OMEGA-3 FATTY ACIDS PO    Take by mouth.   SIMVASTATIN (ZOCOR) 20 MG TABLET    TAKE 1 TABLET BY MOUTH AT BEDTIME    Review of Systems  Constitutional: Negative.   Respiratory: Negative.   Cardiovascular: Positive for leg swelling (Occasional leg swelling.). Negative for chest pain and palpitations.  Gastrointestinal: Negative.   Endocrine: Negative.   Neurological: Negative for dizziness, light-headedness, numbness and headaches.    Social History  Substance Use Topics  . Smoking status: Former Smoker    Quit date: 03/09/1978  . Smokeless tobacco: Never Used  . Alcohol Use: Yes     Comment: occasional   Objective:  BP 128/86 mmHg  Pulse 68  Temp(Src) 97.9 F (36.6 C) (Oral)  Resp 16  Wt 179 lb (81.194 kg)  Physical Exam  Constitutional: She is oriented to person, place, and time. She appears well-developed and well-nourished.  Cardiovascular: Normal rate and regular rhythm.   Pulmonary/Chest: Effort normal and breath sounds normal.  Neurological: She is alert and oriented to person, place, and time.  Skin:  Multiple ecchymosis on arms.    Psychiatric: She has a normal mood and affect.  Her behavior is normal. Judgment and thought content normal.      Assessment & Plan:     1. Type 2 diabetes mellitus without complication, without long-term current use of insulin (HCC) Not at goal.   Start Glipizide.  Do not take if not eating. Recheck in 3 months.   - POCT glycosylated hemoglobin (Hb A1C) - glipiZIDE (GLUCOTROL XL) 2.5 MG 24 hr tablet; Take 1 tablet (2.5 mg total) by mouth daily with breakfast.  Dispense: 30 tablet; Refill: 5 Results for orders placed or performed in visit on 08/15/15  POCT glycosylated hemoglobin (Hb A1C)  Result Value Ref Range   Hemoglobin A1C 9.1     2. Hypercholesteremia Condition is stable. Please continue current medication and  plan of care as noted.   Check labs today.  Adjust as needed.   3. Essential (primary) hypertension Condition is stable. Please continue current medication and  plan of care as noted.  Check labs today.    4. Senile purpura ([place]) Reassurance given today.        Margarita Rana, MD  [facility]

## 2015-08-16 DIAGNOSIS — E7800 Pure hypercholesterolemia, unspecified: Secondary | ICD-10-CM | POA: Diagnosis not present

## 2015-08-16 DIAGNOSIS — I10 Essential (primary) hypertension: Secondary | ICD-10-CM | POA: Diagnosis not present

## 2015-08-17 ENCOUNTER — Telehealth: Payer: Self-pay

## 2015-08-17 LAB — CBC WITH DIFFERENTIAL/PLATELET
Basophils Absolute: 0 10*3/uL (ref 0.0–0.2)
Basos: 0 %
EOS (ABSOLUTE): 0.2 10*3/uL (ref 0.0–0.4)
Eos: 3 %
Hematocrit: 39.4 % (ref 34.0–46.6)
Hemoglobin: 13.7 g/dL (ref 11.1–15.9)
IMMATURE GRANULOCYTES: 0 %
Immature Grans (Abs): 0 10*3/uL (ref 0.0–0.1)
Lymphocytes Absolute: 3.9 10*3/uL — ABNORMAL HIGH (ref 0.7–3.1)
Lymphs: 52 %
MCH: 30.6 pg (ref 26.6–33.0)
MCHC: 34.8 g/dL (ref 31.5–35.7)
MCV: 88 fL (ref 79–97)
MONOS ABS: 0.9 10*3/uL (ref 0.1–0.9)
Monocytes: 12 %
NEUTROS PCT: 33 %
Neutrophils Absolute: 2.6 10*3/uL (ref 1.4–7.0)
PLATELETS: 258 10*3/uL (ref 150–379)
RBC: 4.48 x10E6/uL (ref 3.77–5.28)
RDW: 13.1 % (ref 12.3–15.4)
WBC: 7.7 10*3/uL (ref 3.4–10.8)

## 2015-08-17 LAB — COMPREHENSIVE METABOLIC PANEL
ALBUMIN: 3.9 g/dL (ref 3.5–4.7)
ALT: 18 IU/L (ref 0–32)
AST: 15 IU/L (ref 0–40)
Albumin/Globulin Ratio: 1.6 (ref 1.2–2.2)
Alkaline Phosphatase: 43 IU/L (ref 39–117)
BILIRUBIN TOTAL: 0.7 mg/dL (ref 0.0–1.2)
BUN / CREAT RATIO: 25 (ref 12–28)
BUN: 22 mg/dL (ref 8–27)
CO2: 27 mmol/L (ref 18–29)
CREATININE: 0.87 mg/dL (ref 0.57–1.00)
Calcium: 9.6 mg/dL (ref 8.7–10.3)
Chloride: 99 mmol/L (ref 96–106)
GFR, EST AFRICAN AMERICAN: 72 mL/min/{1.73_m2} (ref >59)
GFR, EST NON AFRICAN AMERICAN: 62 mL/min/{1.73_m2} (ref >59)
GLUCOSE: 153 mg/dL — AB (ref 65–99)
Globulin, Total: 2.4 g/dL (ref 1.5–4.5)
Potassium: 4.6 mmol/L (ref 3.5–5.2)
Sodium: 142 mmol/L (ref 134–144)
TOTAL PROTEIN: 6.3 g/dL (ref 6.0–8.5)

## 2015-08-17 LAB — LIPID PANEL
CHOL/HDL RATIO: 3.1 ratio (ref 0.0–4.4)
Cholesterol, Total: 169 mg/dL (ref 100–199)
HDL: 55 mg/dL (ref >39)
LDL Calculated: 85 mg/dL (ref 0–99)
Triglycerides: 145 mg/dL (ref 0–149)
VLDL CHOLESTEROL CAL: 29 mg/dL (ref 5–40)

## 2015-08-17 LAB — TSH: TSH: 4.14 u[IU]/mL (ref 0.450–4.500)

## 2015-08-17 NOTE — Telephone Encounter (Signed)
-----   Message from Margarita Rana, MD sent at 08/17/2015 10:53 AM EDT ----- Labs stable.  Please notify patient. Thanks.

## 2015-08-17 NOTE — Telephone Encounter (Signed)
Pt advised.   Thanks,   -Laura  

## 2015-08-21 DIAGNOSIS — Z85828 Personal history of other malignant neoplasm of skin: Secondary | ICD-10-CM | POA: Diagnosis not present

## 2015-08-21 DIAGNOSIS — L821 Other seborrheic keratosis: Secondary | ICD-10-CM | POA: Diagnosis not present

## 2015-08-21 DIAGNOSIS — D692 Other nonthrombocytopenic purpura: Secondary | ICD-10-CM | POA: Diagnosis not present

## 2015-08-21 DIAGNOSIS — L218 Other seborrheic dermatitis: Secondary | ICD-10-CM | POA: Diagnosis not present

## 2015-08-21 DIAGNOSIS — L570 Actinic keratosis: Secondary | ICD-10-CM | POA: Diagnosis not present

## 2015-08-22 ENCOUNTER — Telehealth: Payer: Self-pay | Admitting: Family Medicine

## 2015-08-22 DIAGNOSIS — M25559 Pain in unspecified hip: Secondary | ICD-10-CM

## 2015-08-22 NOTE — Telephone Encounter (Signed)
Never seen for hip pain. Please advise. Renaldo Fiddler, CMA

## 2015-08-22 NOTE — Telephone Encounter (Signed)
Pt is wanting a referral to ortho because of hip pain.  She states she can only sleep in recliner.  She thinks she pulled a muscle doing yard work and the pain is a 6/7 now.  I suggested she make an appt with Dr. Venia [name] for the referral and she stated she isn't able to drive herself up her.  She was seen last week but not hip pain.

## 2015-08-22 NOTE — Telephone Encounter (Signed)
Referred pt to Dr. Mack Guise. Renaldo Fiddler, CMA

## 2015-08-22 NOTE — Telephone Encounter (Signed)
Ok to add diagnosis and  refer. Clarify who she wants to see. Thanks.

## 2015-08-27 DIAGNOSIS — M71559 Other bursitis, not elsewhere classified, unspecified hip: Secondary | ICD-10-CM | POA: Diagnosis not present

## 2015-08-27 DIAGNOSIS — M7070 Other bursitis of hip, unspecified hip: Secondary | ICD-10-CM | POA: Insufficient documentation

## 2015-08-31 ENCOUNTER — Other Ambulatory Visit: Payer: Self-pay | Admitting: Family Medicine

## 2015-08-31 DIAGNOSIS — I10 Essential (primary) hypertension: Secondary | ICD-10-CM

## 2015-09-15 ENCOUNTER — Encounter: Payer: Self-pay | Admitting: Emergency Medicine

## 2015-09-15 ENCOUNTER — Emergency Department
Admission: EM | Admit: 2015-09-15 | Discharge: 2015-09-15 | Disposition: A | Discharge status: Home / Self Care | Payer: Medicare Other | Attending: Emergency Medicine | Admitting: Emergency Medicine

## 2015-09-15 DIAGNOSIS — Z87891 Personal history of nicotine dependence: Secondary | ICD-10-CM | POA: Insufficient documentation

## 2015-09-15 DIAGNOSIS — Z79899 Other long term (current) drug therapy: Secondary | ICD-10-CM | POA: Diagnosis not present

## 2015-09-15 DIAGNOSIS — Y939 Activity, unspecified: Secondary | ICD-10-CM | POA: Insufficient documentation

## 2015-09-15 DIAGNOSIS — M1990 Unspecified osteoarthritis, unspecified site: Secondary | ICD-10-CM | POA: Insufficient documentation

## 2015-09-15 DIAGNOSIS — Y92813 Airplane as the place of occurrence of the external cause: Secondary | ICD-10-CM | POA: Diagnosis not present

## 2015-09-15 DIAGNOSIS — Z85828 Personal history of other malignant neoplasm of skin: Secondary | ICD-10-CM | POA: Insufficient documentation

## 2015-09-15 DIAGNOSIS — E119 Type 2 diabetes mellitus without complications: Secondary | ICD-10-CM | POA: Diagnosis not present

## 2015-09-15 DIAGNOSIS — Y999 Unspecified external cause status: Secondary | ICD-10-CM | POA: Insufficient documentation

## 2015-09-15 DIAGNOSIS — W01198A Fall on same level from slipping, tripping and stumbling with subsequent striking against other object, initial encounter: Secondary | ICD-10-CM | POA: Insufficient documentation

## 2015-09-15 DIAGNOSIS — I10 Essential (primary) hypertension: Secondary | ICD-10-CM | POA: Insufficient documentation

## 2015-09-15 DIAGNOSIS — S81811A Laceration without foreign body, right lower leg, initial encounter: Secondary | ICD-10-CM | POA: Diagnosis not present

## 2015-09-15 DIAGNOSIS — Z7982 Long term (current) use of aspirin: Secondary | ICD-10-CM | POA: Diagnosis not present

## 2015-09-15 DIAGNOSIS — Z7984 Long term (current) use of oral hypoglycemic drugs: Secondary | ICD-10-CM | POA: Insufficient documentation

## 2015-09-15 DIAGNOSIS — S8991XA Unspecified injury of right lower leg, initial encounter: Secondary | ICD-10-CM | POA: Diagnosis present

## 2015-09-15 MED ORDER — LIDOCAINE-EPINEPHRINE 2 %-1:100000 IJ SOLN
1.7000 mL | Freq: Once | INTRAMUSCULAR | Status: DC
  Filled 2015-09-15: qty 1.7

## 2015-09-15 MED ORDER — LIDOCAINE-EPINEPHRINE (PF) 1 %-1:200000 IJ SOLN
30.0000 mL | Freq: Once | INTRAMUSCULAR | Status: AC
  Administered 2015-09-15: 30 mL via INTRADERMAL
  Filled 2015-09-15: qty 30

## 2015-09-15 NOTE — ED Notes (Signed)
Pt verbalized understanding of discharge instructions. NAD at this time. 

## 2015-09-15 NOTE — Discharge Instructions (Signed)
Keep the wound clean, dry, and covered. Follow-up with your wound care specialist in 12-14 days for suture removal.   Laceration Care, Adult A laceration is a cut that goes through all layers of the skin. The cut also goes into the tissue that is right under the skin. Some cuts heal on their own. Others need to be closed with stitches (sutures), staples, skin adhesive strips, or wound glue. Taking care of your cut lowers your risk of infection and helps your cut to heal better. HOW TO TAKE CARE OF YOUR CUT For stitches or staples:  Keep the wound clean and dry.  If you were given a bandage (dressing), you should change it at least one time per day or as told by your doctor. You should also change it if it gets wet or dirty.  Keep the wound completely dry for the first 24 hours or as told by your doctor. After that time, you may take a shower or a bath. However, make sure that the wound is not soaked in water until after the stitches or staples have been removed.  Clean the wound one time each day or as told by your doctor:  Wash the wound with soap and water.  Rinse the wound with water until all of the soap comes off.  Pat the wound dry with a clean towel. Do not rub the wound.  After you clean the wound, put a thin layer of antibiotic ointment on it as told by your doctor. This ointment:  Helps to prevent infection.  Keeps the bandage from sticking to the wound.  Have your stitches or staples removed as told by your doctor. If your doctor used skin adhesive strips:   Keep the wound clean and dry.  If you were given a bandage, you should change it at least one time per day or as told by your doctor. You should also change it if it gets dirty or wet.  Do not get the skin adhesive strips wet. You can take a shower or a bath, but be careful to keep the wound dry.  If the wound gets wet, pat it dry with a clean towel. Do not rub the wound.  Skin adhesive strips fall off on their  own. You can trim the strips as the wound heals. Do not remove any strips that are still stuck to the wound. They will fall off after a while. If your doctor used wound glue:  Try to keep your wound dry, but you may briefly wet it in the shower or bath. Do not soak the wound in water, such as by swimming.  After you take a shower or a bath, gently pat the wound dry with a clean towel. Do not rub the wound.  Do not do any activities that will make you really sweaty until the skin glue has fallen off on its own.  Do not apply liquid, cream, or ointment medicine to your wound while the skin glue is still on.  If you were given a bandage, you should change it at least one time per day or as told by your doctor. You should also change it if it gets dirty or wet.  If a bandage is placed over the wound, do not let the tape for the bandage touch the skin glue.  Do not pick at the glue. The skin glue usually stays on for 5-10 days. Then, it falls off of the skin. General Instructions  To help prevent scarring,  make sure to cover your wound with sunscreen whenever you are outside after stitches are removed, after adhesive strips are removed, or when wound glue stays in place and the wound is healed. Make sure to wear a sunscreen of at least 30 SPF.  Take over-the-counter and prescription medicines only as told by your doctor.  If you were given antibiotic medicine or ointment, take or apply it as told by your doctor. Do not stop using the antibiotic even if your wound is getting better.  Do not scratch or pick at the wound.  Keep all follow-up visits as told by your doctor. This is important.  Check your wound every day for signs of infection. Watch for:  Redness, swelling, or pain.  Fluid, blood, or pus.  Raise (elevate) the injured area above the level of your heart while you are sitting or lying down, if possible. GET HELP IF:  You got a tetanus shot and you have any of these problems  at the injection site:  Swelling.  Very bad pain.  Redness.  Bleeding.  You have a fever.  A wound that was closed breaks open.  You notice a bad smell coming from your wound or your bandage.  You notice something coming out of the wound, such as wood or glass.  Medicine does not help your pain.  You have more redness, swelling, or pain at the site of your wound.  You have fluid, blood, or pus coming from your wound.  You notice a change in the color of your skin near your wound.  You need to change the bandage often because fluid, blood, or pus is coming from the wound.  You start to have a new rash.  You start to have numbness around the wound. GET HELP RIGHT AWAY IF:  You have very bad swelling around the wound.  Your pain suddenly gets worse and is very bad.  You notice painful lumps near the wound or on skin that is anywhere on your body.  You have a red streak going away from your wound.  The wound is on your hand or foot and you cannot move a finger or toe like you usually can.  The wound is on your hand or foot and you notice that your fingers or toes look pale or bluish.   This information is not intended to replace advice given to you by your health care provider. Make sure you discuss any questions you have with your health care provider.   Document Released: 08/13/2007 Document Revised: 07/11/2014 Document Reviewed: 02/20/2014 Elsevier Interactive Patient Education 2016 [employer]  Skin Tear Care A skin tear is a wound in which the top layer of skin has peeled off. This is a common problem with aging because the skin becomes thinner and more fragile as a person gets older. In addition, some medicines, such as oral corticosteroids, can lead to skin thinning if taken for long periods of time.  A skin tear is often repaired with tape or skin adhesive strips. This keeps the skin that has been peeled off in contact with the healthier skin beneath.  Depending on the location of the wound, a bandage (dressing) may be applied over the tape or skin adhesive strips. Sometimes, during the healing process, the skin turns black and dies. Even when this happens, the torn skin acts as a good dressing until the skin underneath gets healthier and repairs itself. HOME CARE INSTRUCTIONS   Change dressings once per day or  as directed by your caregiver.  Gently clean the skin tear and the area around the tear using saline solution or mild soap and water.  Do not rub the injured skin dry. Let the area air dry.  Apply petroleum jelly or an antibiotic cream or ointment to keep the tear moist. This will help the wound heal. Do not allow a scab to form.  If the dressing sticks before the next dressing change, moisten it with warm soapy water and gently remove it.  Protect the injured skin until it has healed.  Only take over-the-counter or prescription medicines as directed by your caregiver.  Take showers or baths using warm soapy water. Apply a new dressing after the shower or bath.  Keep all follow-up appointments as directed by your caregiver.  SEEK IMMEDIATE MEDICAL CARE IF:   You have redness, swelling, or increasing pain in the skin tear.  You havepus coming from the skin tear.  You have chills.  You have a red streak that goes away from the skin tear.  You have a bad smell coming from the tear or dressing.  You have a fever or persistent symptoms for more than 2-3 days.  You have a fever and your symptoms suddenly get worse. MAKE SURE YOU:  Understand these instructions.  Will watch this condition.  Will get help right away if your child is not doing well or gets worse.   This information is not intended to replace advice given to you by your health care provider. Make sure you discuss any questions you have with your health care provider.   Document Released: 11/19/2000 Document Revised: 11/19/2011 Document Reviewed:  09/08/2011 Elsevier Interactive Patient Education [employer].

## 2015-09-15 NOTE — ED Notes (Signed)
Pt fell up the stairs boarding a plane. Pt has a large laceration to her RLE.

## 2015-09-15 NOTE — ED Provider Notes (Signed)
[facility] Emergency Department Provider Note ____________________________________________  Time seen: 1637  I have reviewed the triage vital signs and the nursing notes.  HISTORY  Chief Complaint  Extremity Laceration  HPI [name] is a 82 y.o. female presents to the ED after she sustained a deep skin tear-type laceration to the lower lateral right leg. Bleeding is controlled by a gauze. She denies any other injury at this time. She denies any significant pain at this time. She sustained injury while embarking on airplane in [place]. She accidentally tripped, cutting her right leg on the stair into the airplane. She denies any head injury, loss of consciousness, or other injury sustained at this time. She opted to complete her flight from childhood to [place] and presents here for suture repair.   Past Medical History  Diagnosis Date  . Diabetes mellitus without complication ([place])   . Hypertension   . Diverticulitis     Patient Active Problem List   Diagnosis Date Noted  . Senile purpura ([place]) 01/01/2015  . Edema 08/15/2014  . Arthritis 07/12/2014  . Basal cell carcinoma 07/12/2014  . CC (collagenous colitis) 07/12/2014  . Essential (primary) hypertension 07/12/2014  . Hypercholesteremia 07/12/2014  . Adiposity 07/12/2014  . Acne erythematosa 07/12/2014  . Diabetes mellitus, type 2 ([place]) 07/12/2014  . 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 Avitaminosis D 07/12/2014  . H/O malignant neoplasm of skin 10/10/2011    Past Surgical History  Procedure Laterality Date  . Abdominal hysterectomy    . Back surgery  08/08/2008    Lumbar discectomy  . Neck surgery  1980    Current Outpatient Rx  Name  Route  Sig  Dispense  Refill  . aspirin 81 MG tablet   Oral   Take by mouth.         . Blood Glucose Monitoring Suppl (ACCU-CHEK AVIVA CONNECT) W/DEVICE KIT      ACCU-CHEK AVIVA (In Vitro Strip)  1 (one) Strip Strip daily for 0 days  Quantity: 100;   Refills: 3   Ordered :24-Oct-2013  Lynford Humphrey ;  Started 24-Oct-2013 Active Comments: Dx: 250.00 also include Accu-chek aviva meter         . budesonide (ENTOCORT EC) 3 MG 24 hr capsule      TAKE 3  CAPSULES  ORAL, DAILY   90 capsule   4     CYCLE FILL MEDICATION. Authorization is required f ...   . Calcium-Vitamin D 600-200 MG-UNIT per tablet   Oral   Take by mouth.         . Cholecalciferol (VITAMIN D) 2000 UNITS CAPS   Oral   Take by mouth.         Marland Kitchen glipiZIDE (GLUCOTROL XL) 2.5 MG 24 hr tablet   Oral   Take 1 tablet (2.5 mg total) by mouth daily with breakfast.   30 tablet   5   . glucose blood test strip      BAYER CONTOUR TEST (In Vitro Strip)  1 (one) Strip Strip to check blood sugar daily for 0 days  Quantity: 50;  Refills: 5   Ordered :20-May-2013  Gerald Leitz ;  Started 20-May-2013 Active Comments: & Lancets         . lisinopril-hydrochlorothiazide (PRINZIDE,ZESTORETIC) 10-12.5 MG tablet      1 (ONE) TABLET, ORAL, DAILY   90 tablet   1     CYCLE FILL MEDICATION. Authorization is required f ...   . metFORMIN (GLUCOPHAGE) 500 MG tablet  Oral   Take 1 tablet (500 mg total) by mouth 2 (two) times daily with a meal.   180 tablet   1     CYCLE FILL MEDICATION. Authorization is required f ...   . Multiple Vitamin tablet   Oral   Take by mouth. Reported on 03/13/2015         . OMEGA-3 FATTY ACIDS PO   Oral   Take by mouth.         . simvastatin (ZOCOR) 20 MG tablet      TAKE 1 TABLET BY MOUTH AT BEDTIME   90 tablet   2     Rx has expired - unused refills remain    Allergies Morphine sulfate and Penicillins  Family History  Problem Relation Age of Onset  . Hypertension Mother   . Diabetes Mother   . Lung cancer Father    Social History Social History  Substance Use Topics  . Smoking status: Former Smoker    Quit date: 03/09/1978  . Smokeless tobacco: Never Used  . Alcohol Use: Yes     Comment: occasional   Review of  Systems  Constitutional: Negative for fever. Cardiovascular: Negative for chest pain. Musculoskeletal: Negative for back pain. Skin: Negative for rash. Leg laceration as above.  Neurological: Negative for headaches, focal weakness or numbness. ____________________________________________  PHYSICAL EXAM:  VITAL SIGNS: ED Triage Vitals  Enc Vitals Group     BP --      Pulse --      Resp --      Temp --      Temp src --      SpO2 --      Weight --      Height --      Head Cir --      Peak Flow --      Pain Score --      Pain Loc --      Pain Edu? --      Excl. in [place]? --    Constitutional: Alert and oriented. Well appearing and in no distress. Head: Normocephalic and atraumatic. Cardiovascular: Normal distal pulses and cap refill.   Respiratory: Normal respiratory effort.  Musculoskeletal: Nontender with normal range of motion in all extremities.  Neurologic:  Normal gait without ataxia. Normal gross sensation. No gross focal neurologic deficits are appreciated. Skin:  Skin is warm, dry and intact. No rash noted. Patient with a large skin tear/laceration to the lower right leg. Local hematoma noted. No active bleeding.  ____________________________________________  LACERATION REPAIR Performed by: Melvenia Needles Authorized by: Melvenia Needles Consent: Verbal consent obtained. Risks and benefits: risks, benefits and alternatives were discussed Consent given by: patient Patient identity confirmed: provided demographic data Prepped and Draped in normal sterile fashion Wound explored  Laceration Location: right shin  Laceration Length: 11.5 cm  No Foreign Bodies seen or palpated  Anesthesia: local infiltration  Local anesthetic: lidocaine 1% w/ epinephrine  Anesthetic total: 12 ml  Irrigation method: syringe Amount of cleaning: standard  Skin closure: 4-0 nylon  Number of sutures: 23  Technique: interrupted with Steri-strips used to gird-up  skin edges  Patient tolerance: Patient tolerated the procedure well with no immediate complications. ____________________________________________  INITIAL IMPRESSION / ASSESSMENT AND PLAN / ED COURSE  Patient with a large skin flap laceration to the right lower extremities status post suture repair. Patient tolerated procedure well and wound edge approximation is satisfactory. Patient is discharged with wound  care instructions and will follow with primary care provider in 12-14 days for suture removal. He is advised to keep the leg elevated when seated. She should return to the ED as needed for any wound check as necessary. ____________________________________________  FINAL CLINICAL IMPRESSION(S) / ED DIAGNOSES  Final diagnoses:  Leg laceration, right, initial encounter  Skin tear of lower leg without complication, right, initial encounter     Melvenia Needles, PA-C 09/15/15 1832  Earleen Newport, MD 09/15/15 [phone]

## 2015-09-27 ENCOUNTER — Ambulatory Visit: Payer: Medicare Other | Admitting: Surgery

## 2015-09-28 ENCOUNTER — Encounter: Payer: Medicare Other | Attending: General Surgery | Admitting: General Surgery

## 2015-09-28 ENCOUNTER — Encounter: Payer: Self-pay | Admitting: General Surgery

## 2015-09-28 DIAGNOSIS — I10 Essential (primary) hypertension: Secondary | ICD-10-CM | POA: Diagnosis not present

## 2015-09-28 DIAGNOSIS — E119 Type 2 diabetes mellitus without complications: Secondary | ICD-10-CM | POA: Insufficient documentation

## 2015-09-28 DIAGNOSIS — S81811A Laceration without foreign body, right lower leg, initial encounter: Secondary | ICD-10-CM | POA: Diagnosis not present

## 2015-09-28 DIAGNOSIS — Z87891 Personal history of nicotine dependence: Secondary | ICD-10-CM | POA: Insufficient documentation

## 2015-09-28 DIAGNOSIS — Z7984 Long term (current) use of oral hypoglycemic drugs: Secondary | ICD-10-CM | POA: Insufficient documentation

## 2015-09-28 DIAGNOSIS — X58XXXA Exposure to other specified factors, initial encounter: Secondary | ICD-10-CM | POA: Diagnosis not present

## 2015-09-28 DIAGNOSIS — M1990 Unspecified osteoarthritis, unspecified site: Secondary | ICD-10-CM | POA: Insufficient documentation

## 2015-09-28 DIAGNOSIS — S81812A Laceration without foreign body, left lower leg, initial encounter: Secondary | ICD-10-CM | POA: Diagnosis not present

## 2015-09-28 DIAGNOSIS — Z880 Allergy status to penicillin: Secondary | ICD-10-CM | POA: Diagnosis not present

## 2015-09-28 NOTE — Progress Notes (Signed)
1 week post laceration anterior right lower leg. Sutures in olace and tissue appears viable

## 2015-09-29 NOTE — Progress Notes (Signed)
ULYSSA, CROWNOVER ([id number]) Visit Report for 09/28/2015 Abuse/Suicide Risk Screen Details Patient Name: [name], [name] Date of Service: 09/28/2015 9:30 AM Medical Record Number: [id number] Patient Account Number: [id number] Date of Birth/Sex: 07-10-1931 (82 y.o. Female) Treating RN: Catalina Lunger Primary Care Physician: Wilhemena Durie Other Clinician: Referring Physician: Margarita Rana Treating Physician/Extender: Benjaman Pott in Treatment: 0 Abuse/Suicide Risk Screen Items Answer ABUSE/SUICIDE RISK SCREEN: Has anyone close to you tried to hurt or harm you recentlyo No Do you feel uncomfortable with anyone in your familyo No Has anyone forced you do things that you didnot want to doo No Do you have any thoughts of harming yourselfo No Patient displays signs or symptoms of abuse and/or neglect. No Electronic Signature(s) Signed: 09/28/2015 10:19:04 AM By: Catalina Lunger Entered By: Catalina Lunger on 09/28/2015 09:59:59 [name] ([id number]) -------------------------------------------------------------------------------- Activities of Daily Living Details Patient Name: [name] Date of Service: 09/28/2015 9:30 AM Medical Record Number: [id number] Patient Account Number: [id number] Date of Birth/Sex: 21-Dec-1931 (82 y.o. Female) Treating RN: Catalina Lunger Primary Care Physician: Cranford Mon, Delfino Lovett Other Clinician: Referring Physician: Margarita Rana Treating Physician/Extender: Benjaman Pott in Treatment: 0 Activities of Daily Living Items Answer Activities of Daily Living (Please select one for each item) Drive Automobile Completely Able Take Medications Completely Able Use Telephone Completely Able Care for Appearance Completely Able Use Toilet Completely Able Bath / Shower Completely Able Dress Self Completely Able Feed Self Completely Able Walk Completely Able Get In / Out Bed Completely Able Housework Completely Able Prepare  Meals Completely [place] for Self Completely Able Electronic Signature(s) Signed: 09/28/2015 10:19:04 AM By: Catalina Lunger Entered By: Catalina Lunger on 09/28/2015 10:00:34 [name] ([id number]) -------------------------------------------------------------------------------- Education Assessment Details Patient Name: [name] Date of Service: 09/28/2015 9:30 AM Medical Record Number: [id number] Patient Account Number: [id number] Date of Birth/Sex: October 06, 1931 (82 y.o. Female) Treating RN: Catalina Lunger Primary Care Physician: Cranford Mon, Delfino Lovett Other Clinician: Referring Physician: Margarita Rana Treating Physician/Extender: Benjaman Pott in Treatment: 0 Learning Preferences/Education Level/Primary Language Learning Preference: Explanation Highest Education Level: College or Above Preferred Language: English Cognitive Barrier Assessment/Beliefs Language Barrier: No Translator Needed: No Memory Deficit: No Emotional Barrier: No Cultural/Religious Beliefs Affecting Medical No Care: Physical Barrier Assessment Impaired Vision: No Impaired Hearing: No Decreased Hand dexterity: No Knowledge/Comprehension Assessment Knowledge Level: High Comprehension Level: High Ability to understand written High instructions: Ability to understand verbal High instructions: Motivation Assessment Anxiety Level: Calm Cooperation: Cooperative Education Importance: Acknowledges Need Interest in Health Problems: Asks Questions Perception: Coherent Willingness to Engage in Self- High Management Activities: Readiness to Engage in Self- High Management Activities: Electronic Signature(s) Signed: 09/28/2015 10:19:04 AM By: Duke Salvia, Clemens Catholic ([id number]) Entered By: Catalina Lunger on 09/28/2015 10:01:13 [name], [name] ([id number]) -------------------------------------------------------------------------------- Fall  Risk Assessment Details Patient Name: [name] Date of Service: 09/28/2015 9:30 AM Medical Record Number: [id number] Patient Account Number: [id number] Date of Birth/Sex: 1931-04-17 (82 y.o. Female) Treating RN: Catalina Lunger Primary Care Physician: Cranford Mon, Delfino Lovett Other Clinician: Referring Physician: Margarita Rana Treating Physician/Extender: Benjaman Pott in Treatment: 0 Fall Risk Assessment Items Have you had 2 or more falls in the last 12 monthso 0 Yes Have you had any fall that resulted in injury in the last 12 monthso 0 Yes FALL RISK ASSESSMENT: History of falling - immediate or within 3 months 0 No Secondary diagnosis 0 No Ambulatory aid None/bed rest/wheelchair/nurse 0 Yes Crutches/cane/walker 0 No Furniture 0  No IV Access/Saline Lock 0 No Gait/Training Normal/bed rest/immobile 0 Yes Weak 0 No Impaired 0 No Mental Status Oriented to own ability 0 Yes Electronic Signature(s) Signed: 09/28/2015 10:19:04 AM By: Catalina Lunger Entered By: Catalina Lunger on 09/28/2015 10:01:59 [name] ([id number]) -------------------------------------------------------------------------------- Foot Assessment Details Patient Name: [name] Date of Service: 09/28/2015 9:30 AM Medical Record Number: [id number] Patient Account Number: [id number] Date of Birth/Sex: 06/06/1931 (82 y.o. Female) Treating RN: Catalina Lunger Primary Care Physician: Cranford Mon, Delfino Lovett Other Clinician: Referring Physician: Margarita Rana Treating Physician/Extender: Benjaman Pott in Treatment: 0 Foot Assessment Items Site Locations + = Sensation present, - = Sensation absent, C = Callus, U = Ulcer R = Redness, W = Warmth, M = Maceration, PU = Pre-ulcerative lesion F = Fissure, S = Swelling, D = Dryness Assessment Right: Left: Other Deformity: No No Prior Foot Ulcer: No No Prior Amputation: No No Charcot Joint: No No Ambulatory Status: Gait: Electronic  Signature(s) Signed: 09/28/2015 10:19:04 AM By: Catalina Lunger Entered By: Catalina Lunger on 09/28/2015 10:02:46 Miyasato, Clemens Catholic ([id number]) -------------------------------------------------------------------------------- Nutrition Risk Assessment Details Patient Name: [name] Date of Service: 09/28/2015 9:30 AM Medical Record Number: [id number] Patient Account Number: [id number] Date of Birth/Sex: 20-Aug-1931 (82 y.o. Female) Treating RN: Catalina Lunger Primary Care Physician: Wilhemena Durie Other Clinician: Referring Physician: Margarita Rana Treating Physician/Extender: Judene Companion Weeks in Treatment: 0 Height (in): 61 Weight (lbs): 170 Body Mass Index (BMI): 32.1 Nutrition Risk Assessment Items NUTRITION RISK SCREEN: I have an illness or condition that made me change the kind and/or 0 No amount of food I eat I eat fewer than two meals per day 0 No I eat few fruits and vegetables, or milk products 0 No I have three or more drinks of beer, liquor or wine almost every day 0 No I have tooth or mouth problems that make it hard for me to eat 0 No I don't always have enough money to buy the food I need 0 No I eat alone most of the time 0 No I take three or more different prescribed or over-the-counter drugs a 1 Yes day Without wanting to, I have lost or gained 10 pounds in the last six 0 No months I am not always physically able to shop, cook and/or feed myself 0 No Nutrition Protocols Good Risk Protocol Moderate Risk Protocol Electronic Signature(s) Signed: 09/28/2015 10:19:04 AM By: Catalina Lunger Entered By: Catalina Lunger on 09/28/2015 10:02:31

## 2015-09-29 NOTE — Progress Notes (Signed)
[name], [name] ([id number]) Visit Report for 09/28/2015 Chief Complaint Document Details Patient Name: [name], [name] Date of Service: 09/28/2015 9:30 AM Medical Record Number: [id number] Patient Account Number: [id number] Date of Birth/Sex: 04/04/1931 (82 y.o. Female) Treating RN: Cornell Barman Primary Care Physician: Cranford Mon, Delfino Lovett Other Clinician: Referring Physician: Margarita Rana Treating Physician/Extender: Benjaman Pott in Treatment: 0 Information Obtained from: Patient Chief Complaint Patient presents to the wound care center for a consult due non healing wound to the left lower extremity which she's had for about 3 weeks now Electronic Signature(s) Signed: 09/28/2015 1:23:04 PM By: Judene Companion MD Entered By: Judene Companion on 09/28/2015 13:23:03 [name] ([id number]) -------------------------------------------------------------------------------- HPI Details Patient Name: [name] Date of Service: 09/28/2015 9:30 AM Medical Record Number: [id number] Patient Account Number: [id number] Date of Birth/Sex: 29-Aug-1931 (82 y.o. Female) Treating RN: Cornell Barman Primary Care Physician: Cranford Mon, Delfino Lovett Other Clinician: Referring Physician: Margarita Rana Treating Physician/Extender: Benjaman Pott in Treatment: 0 History of Present Illness Location: lacerated wound to the left lower extremity Quality: Patient reports experiencing a dull pain to affected area(s). Severity: Patient states wound (s) are getting better. Duration: Patient has had the wound for < 3 weeks prior to presenting for treatment Timing: Pain in wound is Intermittent (comes and goes Context: The wound occurred when the patient had a blunt injury which caused a laceration to the left lower extremity Modifying Factors: Other treatment(s) tried include:as only applied a bandage to her leg Associated Signs and Symptoms: Patient reports having increase swelling. HPI  Description: 82 year old patient who had an injury to her left lower extremity earlier this year and had been treated by me now comes back with a new injury to her left lower extremity which she's had for about 3 weeks now. Her past medical history is significant for diabetes mellitus, arthritis, basal cell carcinoma, collagenous colitis, essential hypertension, avitaminosis D. She is also status post abdominal hysterectomy, lumbar discectomy, neck surgery. She is a former smoker and quit in 1979. Of note in the recent past she was seen by Dr. Lucky Cowboy for what seems a sounds like injections of varicose veins. This was only around her ankles and not in the lower extremities. No Doppler studies were done. She was not advised to wear any compression stockings. This time around she hit the door of her car and has a significant hematoma which has gone down with time. She did not have any drainage from the wound. Electronic Signature(s) Signed: 09/28/2015 1:23:27 PM By: Judene Companion MD Entered By: Judene Companion on 09/28/2015 13:23:27 [name], [name] ([id number]) -------------------------------------------------------------------------------- Physical Exam Details Patient Name: [name] Date of Service: 09/28/2015 9:30 AM Medical Record Number: [id number] Patient Account Number: [id number] Date of Birth/Sex: 1931/08/07 (82 y.o. Female) Treating RN: Cornell Barman Primary Care Physician: Cranford Mon, Delfino Lovett Other Clinician: Referring Physician: Margarita Rana Treating Physician/Extender: Benjaman Pott in Treatment: 0 Electronic Signature(s) Signed: 09/28/2015 1:23:33 PM By: Judene Companion MD Entered By: Judene Companion on 09/28/2015 13:23:33 [name], [name] ([id number]) -------------------------------------------------------------------------------- Physician Orders Details Patient Name: [name] Date of Service: 09/28/2015 9:30 AM Medical Record Number: [id number] Patient  Account Number: [id number] Date of Birth/Sex: [birth year] (82 y.o. Female) Treating RN: Catalina Lunger Primary Care Physician: Wilhemena Durie Other Clinician: Referring Physician: Margarita Rana Treating Physician/Extender: Benjaman Pott in Treatment: 0 Verbal / Phone Orders: Yes Clinician: Catalina Lunger Read Back and Verified: Yes Diagnosis Coding Wound Cleansing Wound #3 Right,Anterior Lower Leg o Clean  wound with Normal Saline. o May Shower, gently pat wound dry prior to applying new dressing. Anesthetic Wound #3 Right,Anterior Lower Leg o Topical Lidocaine 4% cream applied to wound bed prior to debridement Primary Wound Dressing Wound #3 Right,Anterior Lower Leg o Aquacel Ag Secondary Dressing Wound #3 Right,Anterior Lower Leg o ABD pad o Conform/Kerlix - conform o Non-adherent pad Dressing Change Frequency Wound #3 Right,Anterior Lower Leg o Change dressing every other day. - or more often if needed due to drainage. Follow-up Appointments Wound #3 Right,Anterior Lower Leg o Return Appointment in 1 week. Electronic Signature(s) Signed: 09/28/2015 12:17:39 PM By: Judene Companion MD Signed: 09/28/2015 4:54:23 PM By: Gretta Cool RN, BSN, Kim RN, BSN Previous Signature: 09/28/2015 10:19:04 AM Version By: Catalina Lunger Entered By: Gretta Cool RN, BSN, Kim on 09/28/2015 11:29:23 [name], [name] ([id number]) -------------------------------------------------------------------------------- Problem List Details Patient Name: [name] Date of Service: 09/28/2015 9:30 AM Medical Record Number: [id number] Patient Account Number: [id number] Date of Birth/Sex: 10-01-1931 (82 y.o. Female) Treating RN: Catalina Lunger Primary Care Physician: Cranford Mon, Delfino Lovett Other Clinician: Referring Physician: Margarita Rana Treating Physician/Extender: Benjaman Pott in Treatment: 0 Active Problems ICD-10 Encounter Code Description Active  Date Diagnosis S81.811A Laceration without foreign body, right lower leg, initial 09/28/2015 Yes encounter Inactive Problems Resolved Problems Electronic Signature(s) Signed: 09/28/2015 1:22:50 PM By: Judene Companion MD Previous Signature: 09/28/2015 12:45:32 PM Version By: Judene Companion MD Previous Signature: 09/28/2015 10:19:04 AM Version By: Catalina Lunger Previous Signature: 09/28/2015 12:17:39 PM Version By: Judene Companion MD Entered By: Judene Companion on 09/28/2015 13:22:50 [name], [name] ([id number]) -------------------------------------------------------------------------------- Progress Note Details Patient Name: [name] Date of Service: 09/28/2015 9:30 AM Medical Record Number: [id number] Patient Account Number: [id number] Date of Birth/Sex: 1931/06/17 (82 y.o. Female) Treating RN: Cornell Barman Primary Care Physician: Wilhemena Durie Other Clinician: Referring Physician: Margarita Rana Treating Physician/Extender: Benjaman Pott in Treatment: 0 Subjective Chief Complaint Information obtained from Patient Patient presents to the wound care center for a consult due non healing wound to the left lower extremity which she's had for about 3 weeks now History of Present Illness (HPI) The following HPI elements were documented for the patient's wound: Location: lacerated wound to the left lower extremity Quality: Patient reports experiencing a dull pain to affected area(s). Severity: Patient states wound (s) are getting better. Duration: Patient has had the wound for < 3 weeks prior to presenting for treatment Timing: Pain in wound is Intermittent (comes and goes Context: The wound occurred when the patient had a blunt injury which caused a laceration to the left lower extremity Modifying Factors: Other treatment(s) tried include:as only applied a bandage to her leg Associated Signs and Symptoms: Patient reports having increase swelling. 82 year old patient who  had an injury to her left lower extremity earlier this year and had been treated by me now comes back with a new injury to her left lower extremity which she's had for about 3 weeks now. Her past medical history is significant for diabetes mellitus, arthritis, basal cell carcinoma, collagenous colitis, essential hypertension, avitaminosis D. She is also status post abdominal hysterectomy, lumbar discectomy, neck surgery. She is a former smoker and quit in 1979. Of note in the recent past she was seen by Dr. Lucky Cowboy for what seems a sounds like injections of varicose veins. This was only around her ankles and not in the lower extremities. No Doppler studies were done. She was not advised to wear any compression stockings. This time around she hit the  door of her car and has a significant hematoma which has gone down with time. She did not have any drainage from the wound. Wound History Patient presents with 1 open wound that has been present for approximately 2 wks. Patient has been treating wound in the following manner: stitches. Laboratory tests have not been performed in the last month. Patient reportedly has not tested positive for an antibiotic resistant organism. Patient reportedly has not tested positive for osteomyelitis. Patient reportedly has not had testing performed to evaluate circulation in the legs. Patient experiences the following problems associated with their wounds: swelling. Patient History [name], [name] ([id number]) Information obtained from Patient. Allergies morphine HCl, penicillin Family History Cancer - Father, Diabetes - Mother, Hypertension - Mother, No family history of Heart Disease, Hereditary Spherocytosis, Kidney Disease, Lung Disease, Seizures, Stroke, Thyroid Problems, Tuberculosis. Social History Never smoker, Marital Status - Widowed, Alcohol Use - Rarely, Drug Use - No History, Caffeine Use - Never. Medical History Eyes Patient has history of  Cataracts Denies history of Glaucoma, Optic Neuritis Ear/Nose/Mouth/Throat Denies history of Chronic sinus problems/congestion, Middle ear problems Hematologic/Lymphatic Denies history of Anemia, Hemophilia, Human Immunodeficiency Virus, Lymphedema, Sickle Cell Disease Respiratory Denies history of Aspiration, Asthma, Chronic Obstructive Pulmonary Disease (COPD), Pneumothorax, Sleep Apnea, Tuberculosis Cardiovascular Denies history of Angina, Arrhythmia, Congestive Heart Failure, Coronary Artery Disease, Deep Vein Thrombosis, Hypotension, Myocardial Infarction, Peripheral Arterial Disease, Phlebitis Genitourinary Denies history of End Stage Renal Disease Immunological Denies history of Lupus Erythematosus, Raynaud s, Scleroderma Integumentary (Skin) Denies history of History of Burn, History of pressure wounds Musculoskeletal Denies history of Gout, Rheumatoid Arthritis Neurologic Denies history of Dementia, Neuropathy, Quadriplegia, Paraplegia, Seizure Disorder Psychiatric Denies history of Anorexia/bulimia, Confinement Anxiety Medical And Surgical History Notes Integumentary (Skin) senile purpura, acne erythematosa Review of Systems (ROS) Eyes Complains or has symptoms of Glasses / Contacts. Denies complaints or symptoms of Dry Eyes, Vision Changes. Ear/Nose/Mouth/Throat [name], STANG ([id number]) The patient has no complaints or symptoms. Hematologic/Lymphatic The patient has no complaints or symptoms. Respiratory The patient has no complaints or symptoms. Cardiovascular The patient has no complaints or symptoms. Endocrine The patient has no complaints or symptoms. Genitourinary The patient has no complaints or symptoms. Immunological The patient has no complaints or symptoms. Integumentary (Skin) Complains or has symptoms of Wounds, Swelling. Denies complaints or symptoms of Bleeding or bruising tendency, Breakdown. Musculoskeletal The patient has no  complaints or symptoms. Neurologic The patient has no complaints or symptoms. Oncologic The patient has no complaints or symptoms. Psychiatric Denies complaints or symptoms of Anxiety, Claustrophobia. Objective Constitutional Vitals Time Taken: 9:40 AM, Height: 61 in, Source: Stated, Weight: 170 lbs, Source: Stated, BMI: 32.1, Temperature: 98.0 F, Pulse: 64 bpm, Respiratory Rate: 20 breaths/min, Blood Pressure: 161/57 mmHg. Integumentary (Hair, Skin) Wound #3 status is Open. Original cause of wound was Trauma. The wound is located on the Right,Anterior Lower Leg. The wound measures 9cm length x 8cm width x 0.1cm depth; 56.549cm^2 area and 5.655cm^3 volume. The wound is limited to skin breakdown. There is no tunneling or undermining noted. There is a small amount of serous drainage noted. The wound margin is distinct with the outline attached to the wound base. There is no granulation within the wound bed. There is no necrotic tissue within the wound bed. The periwound skin appearance had no abnormalities noted for color. The periwound skin appearance exhibited: Localized Edema. The periwound skin appearance did not exhibit: Callus, Crepitus, Excoriation, Fluctuance, Friable, Induration, Rash, Scarring, Dry/Scaly, Maceration, Moist. Periwound temperature  was noted as No Abnormality. [name], [name] ([id number]) Assessment Active Problems ICD-10 [id number] - Laceration without foreign body, right lower leg, initial encounter Plan Wound Cleansing: Wound #3 Right,Anterior Lower Leg: Clean wound with Normal Saline. May Shower, gently pat wound dry prior to applying new dressing. Anesthetic: Wound #3 Right,Anterior Lower Leg: Topical Lidocaine 4% cream applied to wound bed prior to debridement Primary Wound Dressing: Wound #3 Right,Anterior Lower Leg: Aquacel Ag Secondary Dressing: Wound #3 Right,Anterior Lower Leg: ABD pad Conform/Kerlix - conform Non-adherent pad Dressing  Change Frequency: Wound #3 Right,Anterior Lower Leg: Change dressing every other day. - or more often if needed due to drainage. Follow-up Appointments: Wound #3 Right,Anterior Lower Leg: Return Appointment in 1 week. Follow-Up Appointments: A follow-up appointment should be scheduled. Medication Reconciliation completed and provided to Patient/Care Provider. A Patient Clinical Summary of Care was provided to [facility], [place] ([id number]) Wound is healing nicely. Sutures ok and flap viable. Good pulses Electronic Signature(s) Signed: 09/28/2015 1:25:30 PM By: Judene Companion MD Entered By: Judene Companion on 09/28/2015 13:25:29 [name], [name] ([id number]) -------------------------------------------------------------------------------- ROS/PFSH Details Patient Name: [name] Date of Service: 09/28/2015 9:30 AM Medical Record Number: [id number] Patient Account Number: [id number] Date of Birth/Sex: 1931-08-21 (82 y.o. Female) Treating RN: Catalina Lunger Primary Care Physician: Wilhemena Durie Other Clinician: Referring Physician: Margarita Rana Treating Physician/Extender: Benjaman Pott in Treatment: 0 Information Obtained From Patient Wound History Do you currently have one or more open woundso Yes How many open wounds do you currently haveo 1 Approximately how long have you had your woundso 2 wks How have you been treating your wound(s) until nowo stitches Has your wound(s) ever healed and then re-openedo No Have you had any lab work done in the past montho No Have you tested positive for an antibiotic resistant organism (MRSA, VRE)o No Have you tested positive for osteomyelitis (bone infection)o No Have you had any tests for circulation on your legso No Have you had other problems associated with your woundso Swelling Eyes Complaints and Symptoms: Positive for: Glasses / Contacts Negative for: Dry Eyes; Vision Changes Medical History: Positive for:  Cataracts Negative for: Glaucoma; Optic Neuritis Ear/Nose/Mouth/Throat Complaints and Symptoms: No Complaints or Symptoms Complaints and Symptoms: Negative for: Difficult clearing ears; Sinusitis Medical History: Negative for: Chronic sinus problems/congestion; Middle ear problems Respiratory Complaints and Symptoms: No Complaints or Symptoms Complaints and Symptoms: Negative for: Chronic or frequent coughs; Shortness of Breath Keithley, Cheverly ([id number]) Medical History: Negative for: Aspiration; Asthma; Chronic Obstructive Pulmonary Disease (COPD); Pneumothorax; Sleep Apnea; Tuberculosis Integumentary (Skin) Complaints and Symptoms: Positive for: Wounds; Swelling Negative for: Bleeding or bruising tendency; Breakdown Medical History: Negative for: History of Burn; History of pressure wounds Past Medical History Notes: senile purpura, acne erythematosa Psychiatric Complaints and Symptoms: Negative for: Anxiety; Claustrophobia Medical History: Negative for: Anorexia/bulimia; Confinement Anxiety Hematologic/Lymphatic Complaints and Symptoms: No Complaints or Symptoms Medical History: Negative for: Anemia; Hemophilia; Human Immunodeficiency Virus; Lymphedema; Sickle Cell Disease Cardiovascular Complaints and Symptoms: No Complaints or Symptoms Medical History: Positive for: Hypertension Negative for: Angina; Arrhythmia; Congestive Heart Failure; Coronary Artery Disease; Deep Vein Thrombosis; Hypotension; Myocardial Infarction; Peripheral Arterial Disease; Phlebitis Gastrointestinal Medical History: Positive for: Colitis Endocrine Complaints and Symptoms: No Complaints or Symptoms Rosenzweig, Edna R. ([id number]) Medical History: Positive for: Type II Diabetes Time with diabetes: 3-4 years Treated with: Oral agents Blood sugar tested every day: No Genitourinary Complaints and Symptoms: No Complaints or Symptoms Medical History: Negative for: End Stage Renal  Disease  Immunological Complaints and Symptoms: No Complaints or Symptoms Medical History: Negative for: Lupus Erythematosus; Raynaudos; Scleroderma Musculoskeletal Complaints and Symptoms: No Complaints or Symptoms Medical History: Positive for: Osteoarthritis Negative for: Gout; Rheumatoid Arthritis Neurologic Complaints and Symptoms: No Complaints or Symptoms Medical History: Negative for: Dementia; Neuropathy; Quadriplegia; Paraplegia; Seizure Disorder Oncologic Complaints and Symptoms: No Complaints or Symptoms Medical History: Negative for: Received Chemotherapy; Received Radiation HBO Extended History Items Eyes: Cataracts Bartz, Arlissa R. ([id number]) Immunizations Immunization Notes: unknown Family and Social History Cancer: Yes - Father; Diabetes: Yes - Mother; Heart Disease: No; Hereditary Spherocytosis: No; Hypertension: Yes - Mother; Kidney Disease: No; Lung Disease: No; Seizures: No; Stroke: No; Thyroid Problems: No; Tuberculosis: No; Never smoker; Marital Status - Widowed; Alcohol Use: Rarely; Drug Use: No History; Caffeine Use: Never; Financial Concerns: No; Food, Clothing or Shelter Needs: No; Support System Lacking: No; Transportation Concerns: No; Advanced Directives: No; Patient does not want information on Advanced Directives Electronic Signature(s) Signed: 09/28/2015 10:19:04 AM By: Catalina Lunger Signed: 09/28/2015 12:17:39 PM By: Judene Companion MD Entered By: Catalina Lunger on 09/28/2015 09:59:34 [name], [name] ([id number]) -------------------------------------------------------------------------------- SuperBill Details Patient Name: [name] Date of Service: 09/28/2015 Medical Record Number: [id number] Patient Account Number: [id number] Date of Birth/Sex: 08-25-1931 (82 y.o. Female) Treating RN: Catalina Lunger Primary Care Physician: Cranford Mon, Delfino Lovett Other Clinician: Referring Physician: Margarita Rana Treating Physician/Extender:  Benjaman Pott in Treatment: 0 Diagnosis Coding ICD-10 Codes Code Description [phone] Laceration without foreign body, right lower leg, initial encounter Facility Procedures CPT4 Code: [id number] Description: 99213 - WOUND CARE VISIT-LEV 3 EST PT Modifier: Quantity: 1 Physician Procedures CPT4 Code Description: [id number] - WC PHYS LEVEL 2 - NEW PT ICD-10 Description Diagnosis S81.811A Laceration without foreign body, right lower leg, init Modifier: ial encounte Quantity: 1 r Electronic Signature(s) Signed: 09/28/2015 1:25:43 PM By: Judene Companion MD Previous Signature: 09/28/2015 12:47:03 PM Version By: Judene Companion MD Previous Signature: 09/28/2015 10:19:04 AM Version By: Catalina Lunger Previous Signature: 09/28/2015 12:17:39 PM Version By: Judene Companion MD Entered By: Judene Companion on 09/28/2015 13:25:43

## 2015-09-29 NOTE — Progress Notes (Addendum)
[name] ([id number]) Visit Report for 09/28/2015 Allergy List Details Patient Name: [name], [name] Date of Service: 09/28/2015 9:30 AM Medical Record Number: [id number] Patient Account Number: [id number] Date of Birth/Sex: 03/13/1931 (82 y.o. Female) Treating RN: Catalina Lunger Primary Care Physician: Wilhemena Durie Other Clinician: Referring Physician: Margarita Rana Treating Physician/Extender: Judene Companion Weeks in Treatment: 0 Allergies Active Allergies morphine HCl penicillin Allergy Notes Electronic Signature(s) Signed: 09/28/2015 10:19:04 AM By: Catalina Lunger Entered By: Catalina Lunger on 09/28/2015 09:54:39 [name] ([id number]) -------------------------------------------------------------------------------- Arrival Information Details Patient Name: [name] Date of Service: 09/28/2015 9:30 AM Medical Record Number: [id number] Patient Account Number: [id number] Date of Birth/Sex: September 27, 1931 (82 y.o. Female) Treating RN: Catalina Lunger Primary Care Physician: Cranford Mon, Delfino Lovett Other Clinician: Referring Physician: Margarita Rana Treating Physician/Extender: Benjaman Pott in Treatment: 0 Visit Information Patient Arrived: Ambulatory Arrival Time: 09:41 Accompanied By: self Transfer Assistance: None Patient Identification Verified: Yes Secondary Verification Process Yes Completed: Patient Has Alerts: Yes Patient Alerts: Patient on Blood Thinner aspirin BMI 1.04 to rt lower leg History Since Last Visit Had a fall or experienced change in activities of daily living that may affect risk of falls: Yes Has Dressing in Place as Prescribed: Yes Electronic Signature(s) Signed: 09/28/2015 10:19:04 AM By: Catalina Lunger Entered By: Catalina Lunger on 09/28/2015 09:48:12 [name] ([id number]) -------------------------------------------------------------------------------- Clinic Level of Care Assessment Details Patient  Name: [name] Date of Service: 09/28/2015 9:30 AM Medical Record Number: [id number] Patient Account Number: [id number] Date of Birth/Sex: Jun 15, 1931 (82 y.o. Female) Treating RN: Catalina Lunger Primary Care Physician: Wilhemena Durie Other Clinician: Referring Physician: Margarita Rana Treating Physician/Extender: Benjaman Pott in Treatment: 0 Clinic Level of Care Assessment Items TOOL 2 Quantity Score X - Use when only an EandM is performed on the INITIAL visit 1 0 ASSESSMENTS - Nursing Assessment / Reassessment X - General Physical Exam (combine w/ comprehensive assessment (listed just 1 20 below) when performed on new pt. evals) X - Comprehensive Assessment (HX, ROS, Risk Assessments, Wounds Hx, etc.) 1 25 ASSESSMENTS - Wound and Skin Assessment / Reassessment X - Simple Wound Assessment / Reassessment - one wound 1 5 '[]'$  - Complex Wound Assessment / Reassessment - multiple wounds 0 '[]'$  - Dermatologic / Skin Assessment (not related to wound area) 0 ASSESSMENTS - Ostomy and/or Continence Assessment and Care '[]'$  - Incontinence Assessment and Management 0 '[]'$  - Ostomy Care Assessment and Management (repouching, etc.) 0 PROCESS - Coordination of Care X - Simple Patient / Family Education for ongoing care 1 15 '[]'$  - Complex (extensive) Patient / Family Education for ongoing care 0 X - Staff obtains Programmer, systems, Records, Test Results / Process Orders 1 10 '[]'$  - Staff telephones HHA, Nursing Homes / Clarify orders / etc 0 '[]'$  - Routine Transfer to another Facility (non-emergent condition) 0 '[]'$  - Routine Hospital Admission (non-emergent condition) 0 X - New Admissions / Biomedical engineer / Ordering NPWT, Apligraf, etc. 1 15 '[]'$  - Emergency Hospital Admission (emergent condition) 0 '[]'$  - Simple Discharge Coordination 0 Sultana, Lindie R. ([id number]) '[]'$  - Complex (extensive) Discharge Coordination 0 PROCESS - Special Needs '[]'$  - Pediatric / Minor Patient Management 0 '[]'$  -  Isolation Patient Management 0 '[]'$  - Hearing / Language / Visual special needs 0 '[]'$  - Assessment of Community assistance (transportation, D/C planning, etc.) 0 '[]'$  - Additional assistance / Altered mentation 0 '[]'$  - Support Surface(s) Assessment (bed, cushion, seat, etc.) 0 INTERVENTIONS - Wound Cleansing / Measurement X -  Wound Imaging (photographs - any number of wounds) 1 5 '[]'$  - Wound Tracing (instead of photographs) 0 X - Simple Wound Measurement - one wound 1 5 '[]'$  - Complex Wound Measurement - multiple wounds 0 '[]'$  - Simple Wound Cleansing - one wound 0 '[]'$  - Complex Wound Cleansing - multiple wounds 0 INTERVENTIONS - Wound Dressings X - Small Wound Dressing one or multiple wounds 1 10 '[]'$  - Medium Wound Dressing one or multiple wounds 0 '[]'$  - Large Wound Dressing one or multiple wounds 0 '[]'$  - Application of Medications - injection 0 INTERVENTIONS - Miscellaneous '[]'$  - External ear exam 0 '[]'$  - Specimen Collection (cultures, biopsies, blood, body fluids, etc.) 0 '[]'$  - Specimen(s) / Culture(s) sent or taken to Lab for analysis 0 '[]'$  - Patient Transfer (multiple staff / Civil Service fast streamer / Similar devices) 0 '[]'$  - Simple Staple / Suture removal (25 or less) 0 '[]'$  - Complex Staple / Suture removal (26 or more) 0 Engram, Veena R. ([id number]) '[]'$  - Hypo / Hyperglycemic Management (close monitor of Blood Glucose) 0 '[]'$  - Ankle / Brachial Index (ABI) - do not check if billed separately 0 Has the patient been seen at the hospital within the last three years: Yes Total Score: 110 Level Of Care: New/Established - Level 3 Electronic Signature(s) Signed: 09/28/2015 11:44:01 AM By: Catalina Lunger Previous Signature: 09/28/2015 10:19:04 AM Version By: Catalina Lunger Entered By: Catalina Lunger on 09/28/2015 11:34:18 [name] ([id number]) -------------------------------------------------------------------------------- Encounter Discharge Information Details Patient Name: [name] Date of  Service: 09/28/2015 9:30 AM Medical Record Number: [id number] Patient Account Number: [id number] Date of Birth/Sex: 1931-06-05 (82 y.o. Female) Treating RN: Catalina Lunger Primary Care Physician: Cranford Mon, Delfino Lovett Other Clinician: Referring Physician: Margarita Rana Treating Physician/Extender: Benjaman Pott in Treatment: 0 Encounter Discharge Information Items Discharge Pain Level: 3 Discharge Condition: Stable Ambulatory Status: Ambulatory Discharge Destination: Home Transportation: Private Auto Schedule Follow-up Appointment: Yes Medication Reconciliation completed and provided to Patient/Care Yes Braylynn Ghan: Provided on Clinical Summary of Care: 09/28/2015 Form Type Recipient Paper Patient [place] Signature(s) Signed: 09/28/2015 1:26:16 PM By: Judene Companion MD Previous Signature: 09/28/2015 12:47:28 PM Version By: Judene Companion MD Previous Signature: 09/28/2015 10:20:51 AM Version By: Ruthine Dose Previous Signature: 09/28/2015 10:19:04 AM Version By: Catalina Lunger Entered By: Judene Companion on 09/28/2015 13:26:16 NAHIARA, KRETZSCHMAR ([id number]) -------------------------------------------------------------------------------- General Visit Notes Details Patient Name: [name] Date of Service: 09/28/2015 9:30 AM Medical Record Number: [id number] Patient Account Number: [id number] Date of Birth/Sex: 1931/12/03 (82 y.o. Female) Treating RN: Catalina Lunger Primary Care Physician: Cranford Mon, Delfino Lovett Other Clinician: Referring Physician: Margarita Rana Treating Physician/Extender: Benjaman Pott in Treatment: 0 Notes Pt fell up steps on airplane; right lower leg wound with stitches; she also has an Josetta Huddle, attorney [phone] to which wound photos were sent along with patient Electronic Signature(s) Signed: 09/28/2015 11:44:01 AM By: Catalina Lunger Entered By: Catalina Lunger on 09/28/2015 11:43:39 Mukherjee, Clemens [name]  ([id number]) -------------------------------------------------------------------------------- Lower Extremity Assessment Details Patient Name: [name] Date of Service: 09/28/2015 9:30 AM Medical Record Number: [id number] Patient Account Number: [id number] Date of Birth/Sex: 18-Feb-1932 (82 y.o. Female) Treating RN: Catalina Lunger Primary Care Physician: Cranford Mon, Delfino Lovett Other Clinician: Referring Physician: Margarita Rana Treating Physician/Extender: Judene Companion Weeks in Treatment: 0 Edema Assessment Assessed: [Left: No] [Right: No] Edema: [Left: Ye] [Right: s] Vascular Assessment Claudication: Claudication Assessment [Right:None] Pulses: Posterior Tibial Dorsalis Pedis Palpable: [Right:Yes] Extremity colors, hair growth, and conditions: Hair Growth on Extremity: [Right:No] Temperature  of Extremity: [Right:Warm] Capillary Refill: [Right:< 3 seconds] Dependent Rubor: [Right:No] Blanched when Elevated: [Right:No] Lipodermatosclerosis: [Right:No] Toe Nail Assessment Left: Right: Thick: No Discolored: No Deformed: No Improper Length and Hygiene: No Electronic Signature(s) Signed: 09/28/2015 10:19:04 AM By: Catalina Lunger Entered By: Catalina Lunger on 09/28/2015 09:50:06 Kraemer, Clemens [name] ([id number]) -------------------------------------------------------------------------------- Multi Wound Chart Details Patient Name: [name] Date of Service: 09/28/2015 9:30 AM Medical Record Number: [id number] Patient Account Number: [id number] Date of Birth/Sex: [birth year] (82 y.o. Female) Treating RN: Catalina Lunger Primary Care Physician: Wilhemena Durie Other Clinician: Referring Physician: Margarita Rana Treating Physician/Extender: Benjaman Pott in Treatment: 0 Vital Signs Height(in): 61 Pulse(bpm): 64 Weight(lbs): 170 Blood Pressure 161/57 (mmHg): Body Mass Index(BMI): 32 Temperature(F): 98.0 Respiratory Rate 20 (breaths/min): Photos:  [N/A:N/A] Wound Location: Right Lower Leg - Anterior N/A N/A Wounding Event: Trauma N/A N/A Primary Etiology: Trauma, Other N/A N/A Date Acquired: 09/15/2015 N/A N/A Weeks of Treatment: 0 N/A N/A Wound Status: Open N/A N/A Measurements L x W x D 9x8x0.1 N/A N/A (cm) Area (cm) : 56.549 N/A N/A Volume (cm) : 5.655 N/A N/A Classification: Full Thickness Without N/A N/A Exposed Support Structures Exudate Amount: Small N/A N/A Exudate Type: Serous N/A N/A Exudate Color: amber N/A N/A Charo, Trystin R. ([id number]) Wound Margin: Distinct, outline attached N/A N/A Granulation Amount: None Present (0%) N/A N/A Necrotic Amount: None Present (0%) N/A N/A Exposed Structures: Fascia: No N/A N/A Fat: No Tendon: No Muscle: No Joint: No Bone: No Limited to Skin Breakdown Epithelialization: None N/A N/A Periwound Skin Texture: Edema: Yes N/A N/A Excoriation: No Induration: No Callus: No Crepitus: No Fluctuance: No Friable: No Rash: No Scarring: No Periwound Skin Maceration: No N/A N/A Moisture: Moist: No Dry/Scaly: No Periwound Skin Color: Atrophie Blanche: No N/A N/A Cyanosis: No Ecchymosis: No Erythema: No Hemosiderin Staining: No Mottled: No Pallor: No Rubor: No Temperature: No Abnormality N/A N/A Tenderness on No N/A N/A Palpation: Wound Preparation: Ulcer Cleansing: N/A N/A Rinsed/Irrigated with Saline Topical Anesthetic Applied: Other: lidocaine 4% Treatment Notes Electronic Signature(s) Signed: 09/28/2015 10:19:04 AM By: Catalina Lunger Entered By: Catalina Lunger on 09/28/2015 10:07:09 ELONDA, GIULIANO ([id number]) TOCCARA, ALFORD ([id number]) -------------------------------------------------------------------------------- [place] Details Patient Name: [name] Date of Service: 09/28/2015 9:30 AM Medical Record Number: [id number] Patient Account Number: [id number] Date of Birth/Sex: 1931/05/20 (82 y.o. Female) Treating  RN: Catalina Lunger Primary Care Physician: Cranford Mon, Delfino Lovett Other Clinician: Referring Physician: Margarita Rana Treating Physician/Extender: Benjaman Pott in Treatment: 0 Active Inactive Abuse / Safety / Falls / Self Care Management Nursing Diagnoses: Potential for falls Goals: Patient/caregiver will verbalize understanding of skin care regimen Date Initiated: 09/28/2015 Goal Status: Active Patient/caregiver will verbalize/demonstrate measures taken to prevent injury and/or falls Date Initiated: 09/28/2015 Goal Status: Active Interventions: Assess fall risk on admission and as needed Assess: immobility, friction, shearing, incontinence upon admission and as needed Assess impairment of mobility on admission and as needed per policy Assess self care needs on admission and as needed Provide education on fall prevention Notes: Orientation to the Wound Care Program Nursing Diagnoses: Knowledge deficit related to the wound healing center program Goals: Patient/caregiver will verbalize understanding of the [place] Program Date Initiated: 09/28/2015 Goal Status: Active Interventions: Provide education on orientation to the wound center Notes: Tartaglia, Adysen R. ([id number]) Pain, Acute or Chronic Nursing Diagnoses: Potential alteration in comfort, pain Goals: Patient will verbalize adequate pain control and receive pain control interventions during procedures as needed Date Initiated: 09/28/2015 Goal  Status: Active Patient/caregiver will verbalize adequate pain control between visits Date Initiated: 09/28/2015 Goal Status: Active Patient/caregiver will verbalize comfort level met Date Initiated: 09/28/2015 Goal Status: Active Interventions: Assess comfort goal upon admission Complete pain assessment as per visit requirements Encourage patient to take pain medications as prescribed Notes: Wound/Skin Impairment Nursing Diagnoses: Impaired tissue  integrity Goals: Ulcer/skin breakdown will heal within 14 weeks Date Initiated: 09/28/2015 Goal Status: Active Interventions: Assess patient/caregiver ability to obtain necessary supplies Assess patient/caregiver ability to perform ulcer/skin care regimen upon admission and as needed Provide education on ulcer and skin care Notes: Electronic Signature(s) Signed: 09/28/2015 11:44:01 AM By: Riki Sheer Previous Signature: 09/28/2015 10:19:04 AM Version By: Riki Sheer Entered By: Riki Sheer on 09/28/2015 11:33:29 Barham, Ann Held ([id number]) KRIZIA, FLIGHT ([id number]) -------------------------------------------------------------------------------- Pain Assessment Details Patient Name: Landis Gandy Date of Service: 09/28/2015 9:30 AM Medical Record Number: [id number] Patient Account Number: [id number] Date of Birth/Sex: Jun 12, 1931 (82 y.o. Female) Treating RN: Riki Sheer Primary Care Physician: Megan Mans Other Clinician: Referring Physician: Lorie Phenix Treating Physician/Extender: Elayne Snare in Treatment: 0 Active Problems Location of Pain Severity and Description of Pain Patient Has Paino Yes Site Locations Rate the pain. Current Pain Level: 5 Pain Management and Medication Current Pain Management: Electronic Signature(s) Signed: 09/28/2015 10:19:04 AM By: Riki Sheer Entered By: Riki Sheer on 09/28/2015 09:44:52 JAYLEAN, BUENAVENTURA ([id number]) -------------------------------------------------------------------------------- Patient/Caregiver Education Details Patient Name: Landis Gandy Date of Service: 09/28/2015 9:30 AM Medical Record Number: [id number] Patient Account Number: [id number] Date of Birth/Gender: [birth year] (82 y.o. Female) Treating RN: Riki Sheer Primary Care Physician: Wendelyn Breslow, Gerlene Burdock Other Clinician: Referring Physician: Lorie Phenix Treating Physician/Extender: Elayne Snare in  Treatment: 0 Education Assessment Education Provided To: Patient Education Topics Provided Infection: Handouts: Infection Prevention and Management Methods: Explain/Verbal Responses: State content correctly Nutrition: Handouts: Nutrition Methods: Explain/Verbal Responses: State content correctly Welcome To [facility]: Handouts: Welcome To [facility] Methods: Explain/Verbal Responses: State content correctly Wound/Skin Impairment: Handouts: Skin Care Do's and Dont's Methods: Explain/Verbal Responses: State content correctly Electronic Signature(s) Signed: 09/28/2015 3:56:11 PM By: Ardath Sax MD Previous Signature: 09/28/2015 10:19:04 AM Version By: Riki Sheer Entered By: Ardath Sax on 09/28/2015 13:26:25 ISHA, SEEFELD ([id number]) -------------------------------------------------------------------------------- Wound Assessment Details Patient Name: Landis Gandy Date of Service: 09/28/2015 9:30 AM Medical Record Number: [id number] Patient Account Number: [id number] Date of Birth/Sex: July 06, 1931 (82 y.o. Female) Treating RN: Riki Sheer Primary Care Physician: Wendelyn Breslow, Gerlene Burdock Other Clinician: Referring Physician: Lorie Phenix Treating Physician/Extender: Ardath Sax Weeks in Treatment: 0 Wound Status Wound Number: 3 Primary Trauma, Other Etiology: Wound Location: Right Lower Leg - Anterior Wound Open Wounding Event: Trauma Status: Date Acquired: 09/15/2015 Comorbid Cataracts, Hypertension, Colitis, Type Weeks Of Treatment: 0 History: II Diabetes, Osteoarthritis Clustered Wound: No Photos Wound Measurements Length: (cm) 9 Width: (cm) 8 Depth: (cm) 0.1 Area: (cm) 56.549 Volume: (cm) 5.655 % Reduction in Area: 0% % Reduction in Volume: 0% Epithelialization: None Tunneling: No Undermining: No Wound Description Full Thickness Without Classification: Exposed Support Structures Diabetic Severity Grade  1 (Wagner): Wound Margin: Distinct, outline attached Exudate Amount: Small Exudate Type: Serous Exudate Color: amber Foul Odor After Cleansing: No Wound Bed Granulation Amount: None Present (0%) Exposed Structure Necrotic Amount: None Present (0%) Fascia Exposed: No Metallo, Manda R. ([id number]) Fat Layer Exposed: No Tendon Exposed: No Muscle Exposed: No Joint Exposed: No Bone Exposed: No Limited to Skin Breakdown Periwound Skin Texture Texture Color No Abnormalities Noted: No No  Abnormalities Noted: Yes Callus: No Temperature / Pain Crepitus: No Temperature: No Abnormality Excoriation: No Fluctuance: No Friable: No Induration: No Localized Edema: Yes Rash: No Scarring: No Moisture No Abnormalities Noted: No Dry / Scaly: No Maceration: No Moist: No Wound Preparation Ulcer Cleansing: Rinsed/Irrigated with Saline Topical Anesthetic Applied: Other: lidocaine 4%, Treatment Notes Wound #3 (Right, Anterior Lower Leg) 1. Cleansed with: Clean wound with Normal Saline May Shower, gently pat wound dry prior to applying new dressing. 2. Anesthetic Topical Lidocaine 4% cream to wound bed prior to debridement 4. Dressing Applied: Aquacel Ag 5. Secondary Dressing Applied Non-Adherent pad ABD and Kerlix/Conform 7. Secured with Recruitment consultant) Signed: 09/28/2015 10:19:04 AM By: Catalina Lunger Signed: 09/28/2015 11:16:59 AM By: Gretta Cool RN, BSN, Kim RN, BSN [id number], [place] ([id number]) Entered By: Gretta Cool RN, BSN, Kim on 09/28/2015 10:15:55 TIMISHA, MONDRY ([id number]) -------------------------------------------------------------------------------- [place] Details Patient Name: [name] Date of Service: 09/28/2015 9:30 AM Medical Record Number: [id number] Patient Account Number: [id number] Date of Birth/Sex: 03-02-1932 (82 y.o. Female) Treating RN: Catalina Lunger Primary Care Physician: Cranford Mon, Delfino Lovett Other Clinician: Referring Physician:  Margarita Rana Treating Physician/Extender: Benjaman Pott in Treatment: 0 Vital Signs Time Taken: 09:40 Temperature (F): 98.0 Height (in): 61 Pulse (bpm): 64 Source: Stated Respiratory Rate (breaths/min): 20 Weight (lbs): 170 Blood Pressure (mmHg): 161/57 Source: Stated Reference Range: 80 - 120 mg / dl Body Mass Index (BMI): 32.1 Electronic Signature(s) Signed: 09/28/2015 10:19:04 AM By: Catalina Lunger Entered By: Catalina Lunger on 09/28/2015 09:46:38

## 2015-10-01 ENCOUNTER — Ambulatory Visit (INDEPENDENT_AMBULATORY_CARE_PROVIDER_SITE_OTHER): Payer: Medicare Other | Admitting: Family Medicine

## 2015-10-01 ENCOUNTER — Encounter: Payer: Self-pay | Admitting: Family Medicine

## 2015-10-01 VITALS — BP 160/62 | HR 80 | Temp 98.3°F | Wt 180.0 lb

## 2015-10-01 DIAGNOSIS — T148 Other injury of unspecified body region: Secondary | ICD-10-CM

## 2015-10-01 DIAGNOSIS — R609 Edema, unspecified: Secondary | ICD-10-CM | POA: Diagnosis not present

## 2015-10-01 DIAGNOSIS — IMO0002 Reserved for concepts with insufficient information to code with codable children: Secondary | ICD-10-CM

## 2015-10-01 DIAGNOSIS — I872 Venous insufficiency (chronic) (peripheral): Secondary | ICD-10-CM | POA: Diagnosis not present

## 2015-10-01 MED ORDER — FUROSEMIDE 20 MG PO TABS: 20.0000 mg | ORAL_TABLET | Freq: Every day | ORAL | 0 refills | Status: DC

## 2015-10-01 NOTE — Progress Notes (Signed)
[name]  MRN: [id number] DOB: 09-03-1931  Subjective:  HPI   The patient is an 82 year old female who presents for evaluation of edema in her feet and legs.  She has had an injury to her right shin which may be contributing to the swelling.   She states that she started having some swelling in her feet 3 weeks ago and then last week it began getting much worse.  She has had bloody drainage from the wound but no pus in the drainage.  She still has the sutures in, as she is seeing the wound clinic and they feel she needs to keep them in for another week.  Patient Active Problem List   Diagnosis Date Noted  . Laceration of right lower leg 09/28/2015  . Senile purpura ([place]) 01/01/2015  . Edema 08/15/2014  . Arthritis 07/12/2014  . Basal cell carcinoma 07/12/2014  . CC (collagenous colitis) 07/12/2014  . Essential (primary) hypertension 07/12/2014  . Hypercholesteremia 07/12/2014  . Adiposity 07/12/2014  . Acne erythematosa 07/12/2014  . Diabetes mellitus, type 2 ([place]) 07/12/2014  . 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 Avitaminosis D 07/12/2014  . H/O malignant neoplasm of skin 10/10/2011    Past Medical History:  Diagnosis Date  . Diabetes mellitus without complication ([place])   . Diverticulitis   . Hypertension     Social History   Social History  . Marital status: Widowed    Spouse name: N/A  . Number of children: N/A  . Years of education: N/A   Occupational History  . Not on file.   Social History Main Topics  . Smoking status: Former Smoker    Quit date: 03/09/1978  . Smokeless tobacco: Never Used  . Alcohol use Yes     Comment: occasional  . Drug use: No  . Sexual activity: Not on file   Other Topics Concern  . Not on file   Social History Narrative  . No narrative on file    Outpatient Medications Prior to Visit  Medication Sig Dispense Refill  . aspirin 81 MG tablet Take by mouth.    . Blood Glucose Monitoring Suppl (ACCU-CHEK AVIVA CONNECT)  W/DEVICE KIT ACCU-CHEK AVIVA (In Vitro Strip)  1 (one) Strip Strip daily for 0 days  Quantity: 100;  Refills: 3   Ordered :24-Oct-2013  Lynford Humphrey ;  Started 24-Oct-2013 Active Comments: Dx: 250.00 also include Accu-chek aviva meter    . budesonide (ENTOCORT EC) 3 MG 24 hr capsule TAKE 3  CAPSULES  ORAL, DAILY 90 capsule 4  . Calcium-Vitamin D 600-200 MG-UNIT per tablet Take by mouth.    . Cholecalciferol (VITAMIN D) 2000 UNITS CAPS Take by mouth.    Marland Kitchen glipiZIDE (GLUCOTROL XL) 2.5 MG 24 hr tablet Take 1 tablet (2.5 mg total) by mouth daily with breakfast. 30 tablet 5  . glucose blood test strip BAYER CONTOUR TEST (In Vitro Strip)  1 (one) Strip Strip to check blood sugar daily for 0 days  Quantity: 50;  Refills: 5   Ordered :20-May-2013  Gerald Leitz ;  Started 20-May-2013 Active Comments: & Lancets    . lisinopril-hydrochlorothiazide (PRINZIDE,ZESTORETIC) 10-12.5 MG tablet 1 (ONE) TABLET, ORAL, DAILY 90 tablet 1  . metFORMIN (GLUCOPHAGE) 500 MG tablet Take 1 tablet (500 mg total) by mouth 2 (two) times daily with a meal. 180 tablet 1  . Multiple Vitamin tablet Take by mouth. Reported on 03/13/2015    . OMEGA-3 FATTY ACIDS PO Take by mouth.    Marland Kitchen  simvastatin (ZOCOR) 20 MG tablet TAKE 1 TABLET BY MOUTH AT BEDTIME 90 tablet 2   No facility-administered medications prior to visit.     Allergies  Allergen Reactions  . Morphine Sulfate   . Penicillins Diarrhea    Review of Systems  Constitutional: Negative for chills and fever.  Respiratory: Negative for cough, shortness of breath and wheezing.   Cardiovascular: Positive for leg swelling. Negative for chest pain, palpitations and orthopnea.   Objective:  BP (!) 160/62 (BP Location: Right Arm, Patient Position: Sitting, Cuff Size: Normal)   Pulse 80   Temp 98.3 F (36.8 C) (Oral)   Wt 180 lb (81.6 kg)   BMI 34.01 kg/m  Wt Readings from Last 3 Encounters:  10/01/15 180 lb (81.6 kg)  09/15/15 170 lb (77.1 kg)  08/15/15 179 lb (81.2  kg)   Physical Exam  Constitutional: She is oriented to person, place, and time and well-developed, well-nourished, and in no distress.  HENT:  Head: Normocephalic.  Eyes: Conjunctivae are normal.  Neck: Neck supple.  Cardiovascular: Normal rate and regular rhythm.   Pulmonary/Chest: Breath sounds normal.  Abdominal: Bowel sounds are normal.  Musculoskeletal: She exhibits edema and tenderness.  Neurological: She is alert and oriented to person, place, and time.    Assessment and Plan :   1. Peripheral edema Has increased weight from onset of laceration to the right lower leg till now. Has varicose veins with venous insufficieny and swelling of both lower legs. Can't wear support hose on the right leg. Given low dose Lasix for prn use and encouraged to use support hose on the left leg. Check weight in 5-7 days and call report. Don't use Lasix if no further swelling. - furosemide (LASIX) 20 MG tablet; Take 1 tablet (20 mg total) by mouth daily.  Dispense: 20 tablet; Refill: 0  2. Laceration Onset 09-15-15 when she fell getting onto a plane. Still has 23 sutures in place across the right lower shin and being followed at the [facility]. Proceed with follow up there in 5 days.  3. Venous insufficiency Peripheral edema from varicose veins and venous insufficiency. No dyspnea, JVD or chest pains.    [facility] 10/01/2015 2:30 PM

## 2015-10-05 ENCOUNTER — Telehealth: Payer: Self-pay | Admitting: Family Medicine

## 2015-10-05 ENCOUNTER — Encounter: Payer: Medicare Other | Admitting: Surgery

## 2015-10-05 DIAGNOSIS — S81812A Laceration without foreign body, left lower leg, initial encounter: Secondary | ICD-10-CM | POA: Diagnosis not present

## 2015-10-05 NOTE — Telephone Encounter (Signed)
Pt stated when she saw Simona Huh 10/01/15 she was advised to call in with her daily weight since starting the dieretic medication. Pt's weigh is as follows:  7/25,7/26, & 10/04/15         170 pounds 10/05/15                              169 pounds  Pt stated that she thinks her scale is off compared to our schedule. Thanks TNP

## 2015-10-05 NOTE — Telephone Encounter (Signed)
Looks like weight is down at least a pound. Fluid pill seems to be starting to help. Should recheck in the office in a week.

## 2015-10-06 NOTE — Progress Notes (Signed)
[name], [name] ([id number]) Visit Report for 10/05/2015 Arrival Information Details Patient Name: [name], [name] Date of Service: 10/05/2015 10:45 AM Medical Record Number: [id number] Patient Account Number: [id number] Date of Birth/Sex: Jul 09, 1931 (82 y.o. Female) Treating RN: [name] Primary Care Physician: [name], [name] Other Clinician: Referring Physician: Wilhemena [name] Treating Physician/Extender: [name] in Treatment: 1 Visit Information History Since Last Visit All ordered tests and consults were completed: No Patient Arrived: [name] Added or deleted any medications: No Arrival Time: 10:59 Any new allergies or adverse reactions: No Accompanied By: daughter Had a fall or experienced change in No Transfer Assistance: None activities of daily living that may affect Patient Identification Verified: Yes risk of falls: Secondary Verification Process Yes Signs or symptoms of abuse/neglect since last No Completed: visito Patient Has Alerts: Yes Hospitalized since last visit: No Patient Alerts: Patient on Blood Pain Present Now: Yes Thinner aspirin BMI 1.04 to rt lower leg Electronic Signature(s) Signed: 10/05/2015 4:45:48 PM By: [name] Entered By: [name] on 10/05/2015 11:00:15 [name] ([id number]) -------------------------------------------------------------------------------- Encounter Discharge Information Details Patient Name: [name] Date of Service: 10/05/2015 10:45 AM Medical Record Number: [id number] Patient Account Number: [id number] Date of Birth/Sex: 11/18/1931 (82 y.o. Female) Treating RN: [name] Primary Care Physician: [name], [name] Other Clinician: Referring Physician: Wilhemena [name] Treating Physician/Extender: [name] in Treatment: 1 Encounter Discharge Information Items Discharge Pain Level: 0 Discharge Condition: Stable Ambulatory Status:  Ambulatory Discharge Destination: Home Transportation: Private Auto Accompanied By: daughter Schedule Follow-up Appointment: Yes Medication Reconciliation completed and provided to Patient/Care Yes [name]: Provided on Clinical Summary of Care: 10/05/2015 Form Type Recipient Paper Patient [facility] Electronic Signature(s) Signed: 10/05/2015 11:57:50 AM By: [name] Entered By: [name] on 10/05/2015 11:57:50 [name] ([id number]) -------------------------------------------------------------------------------- Lower Extremity Assessment Details Patient Name: [name] Date of Service: 10/05/2015 10:45 AM Medical Record Number: [id number] Patient Account Number: [id number] Date of Birth/Sex: 1931-09-24 (82 y.o. Female) Treating RN: [name] Primary Care Physician: [name] Other Clinician: Referring Physician: Cranford [name], [name] Treating Physician/Extender: [name] in Treatment: 1 Vascular Assessment Pulses: Posterior Tibial Dorsalis Pedis Palpable: [Right:Yes] Extremity colors, hair growth, and conditions: Extremity Color: [Right:Normal] Hair Growth on Extremity: [Right:No] Temperature of Extremity: [Right:Warm] Capillary Refill: [Right:< 3 seconds] Toe Nail Assessment Left: Right: Thick: No Discolored: No Deformed: No Improper Length and Hygiene: No Electronic Signature(s) Signed: 10/05/2015 4:45:48 PM By: [name] Entered By: [name] on 10/05/2015 11:10:15 [name] ([id number]) -------------------------------------------------------------------------------- Multi Wound Chart Details Patient Name: [name] Date of Service: 10/05/2015 10:45 AM Medical Record Number: [id number] Patient Account Number: [id number] Date of Birth/Sex: [birth year] (82 y.o. Female) Treating RN: [name] Primary Care Physician: [name] Other Clinician: Referring Physician: Cranford [name],  [name] Treating Physician/Extender: [name] in Treatment: 1 Vital Signs Height(in): 61 Pulse(bpm): 65 Weight(lbs): 170 Blood Pressure 143/73 (mmHg): Body Mass Index(BMI): 32 Temperature(F): 97.5 Respiratory Rate 20 (breaths/min): Photos: [3:No Photos] [N/A:N/A] Wound Location: [3:Right Lower Leg - Anterior N/A] Wounding Event: [3:Trauma] [N/A:N/A] Primary Etiology: [3:Trauma, Other] [N/A:N/A] Comorbid History: [3:Cataracts, Hypertension, N/A Colitis, Type II Diabetes, Osteoarthritis] Date Acquired: [3:09/15/2015] [N/A:N/A] Weeks of Treatment: [3:1] [N/A:N/A] Wound Status: [3:Open] [N/A:N/A] Measurements L x W x D 6x8x0.1 [N/A:N/A] (cm) Area (cm) : [3:37.699] [N/A:N/A] Volume (cm) : [3:3.77] [N/A:N/A] % Reduction in Area: [3:33.30%] [N/A:N/A] % Reduction in Volume: 33.30% [N/A:N/A] Classification: [3:Full Thickness Without Exposed Support Structures] [N/A:N/A] HBO Classification: [3:Grade 1] [N/A:N/A]  Exudate Amount: [3:Large] [N/A:N/A] Exudate Type: [3:Serosanguineous] [N/A:N/A] Exudate Color: [3:red, brown] [N/A:N/A] Foul Odor After [3:Yes] [N/A:N/A] Cleansing: Odor Anticipated Due to No [N/A:N/A] Product Use: Wound Margin: [3:Distinct, outline attached N/A] Granulation Amount: [3:Small (1-33%)] [N/A:N/A] Granulation Quality: [3:Red, Pink] [N/A:N/A] Necrotic Amount: Large (67-100%) N/A N/A Necrotic Tissue: Eschar, Adherent Slough N/A N/A Exposed Structures: Fascia: No N/A N/A Fat: No Tendon: No Muscle: No Joint: No Bone: No Limited to Skin Breakdown Epithelialization: None N/A N/A Periwound Skin Texture: Edema: Yes N/A N/A Excoriation: No Induration: No Callus: No Crepitus: No Fluctuance: No Friable: No Rash: No Scarring: No Periwound Skin Moist: Yes N/A N/A Moisture: Maceration: No Dry/Scaly: No Periwound Skin Color: No Abnormalities Noted N/A N/A Temperature: No Abnormality N/A N/A Tenderness on No N/A N/A Palpation: Wound  Preparation: Ulcer Cleansing: N/A N/A Rinsed/Irrigated with Saline Topical Anesthetic Applied: Other: lidocaine 4% Treatment Notes Electronic Signature(s) Signed: 10/05/2015 4:45:48 PM By: [name] Entered By: [name] on 10/05/2015 11:14:27 [name], [name] ([id number]) -------------------------------------------------------------------------------- Multi-Disciplinary Care Plan Details Patient Name: [name] Date of Service: 10/05/2015 10:45 AM Medical Record Number: [id number] Patient Account Number: [id number] Date of Birth/Sex: 1931/07/03 (82 y.o. Female) Treating RN: Ashok Cordia, [name] Primary Care Physician: Wendelyn Breslow, Gerlene Burdock Other Clinician: Referring Physician: Megan Mans Treating Physician/Extender: Rudene Re in Treatment: 1 Active Inactive Abuse / Safety / Falls / Self Care Management Nursing Diagnoses: Potential for falls Goals: Patient/caregiver will verbalize understanding of skin care regimen Date Initiated: 09/28/2015 Goal Status: Active Patient/caregiver will verbalize/demonstrate measures taken to prevent injury and/or falls Date Initiated: 09/28/2015 Goal Status: Active Interventions: Assess fall risk on admission and as needed Assess: immobility, friction, shearing, incontinence upon admission and as needed Assess impairment of mobility on admission and as needed per policy Assess self care needs on admission and as needed Provide education on fall prevention Notes: Orientation to the Wound Care Program Nursing Diagnoses: Knowledge deficit related to the wound healing center program Goals: Patient/caregiver will verbalize understanding of the Wound Healing Center Program Date Initiated: 09/28/2015 Goal Status: Active Interventions: Provide education on orientation to the wound center Notes: Branch, [name] ([id number]) Pain, Acute or Chronic Nursing Diagnoses: Potential alteration in comfort,  pain Goals: Patient will verbalize adequate pain control and receive pain control interventions during procedures as needed Date Initiated: 09/28/2015 Goal Status: Active Patient/caregiver will verbalize adequate pain control between visits Date Initiated: 09/28/2015 Goal Status: Active Patient/caregiver will verbalize comfort level met Date Initiated: 09/28/2015 Goal Status: Active Interventions: Assess comfort goal upon admission Complete pain assessment as per visit requirements Encourage patient to take pain medications as prescribed Notes: Wound/Skin Impairment Nursing Diagnoses: Impaired tissue integrity Goals: Ulcer/skin breakdown will heal within 14 weeks Date Initiated: 09/28/2015 Goal Status: Active Interventions: Assess patient/caregiver ability to obtain necessary supplies Assess patient/caregiver ability to perform ulcer/skin care regimen upon admission and as needed Provide education on ulcer and skin care Notes: Electronic Signature(s) Signed: 10/05/2015 4:45:48 PM By: [name] Entered By: [name] on 10/05/2015 11:14:17 [name], [name] ([id number]) -------------------------------------------------------------------------------- Pain Assessment Details Patient Name: [name] Date of Service: 10/05/2015 10:45 AM Medical Record Number: [id number] Patient Account Number: [id number] Date of Birth/Sex: January 06, 1932 (82 y.o. Female) Treating RN: Ashok Cordia, [name] Primary Care Physician: Megan Mans Other Clinician: Referring Physician: Megan Mans Treating Physician/Extender: Rudene Re in Treatment: 1 Active Problems Location of Pain Severity and Description of Pain Patient Has Paino Yes Site Locations Pain Location: Pain in Ulcers With Dressing Change: Yes Duration  of the Pain. Constant / Intermittento Constant Rate the pain. Current Pain Level: 4 Worst Pain Level: 10 Least Pain Level: 2 Character of  Pain Describe the Pain: Aching, Other: irritable pain Pain Management and Medication Current Pain Management: Electronic Signature(s) Signed: 10/05/2015 4:45:48 PM By: [name] Entered By: [name] on 10/05/2015 11:01:19 [name] ([id number]) -------------------------------------------------------------------------------- Patient/Caregiver Education Details Patient Name: [name] Date of Service: 10/05/2015 10:45 AM Medical Record Number: [id number] Patient Account Number: [id number] Date of Birth/Gender: January 06, 1932 (82 y.o. Female) Treating RN: [name] Primary Care Physician: [name], [name] Other Clinician: Referring Physician: Cranford [name], [name] Treating Physician/Extender: [name] in Treatment: 1 Education Assessment Education Provided To: Patient Education Topics Provided Safety: Handouts: Safe Transfers Methods: Explain/Verbal Wound/Skin Impairment: Handouts: Other: change dressing as ordered Methods: Demonstration, Explain/Verbal Responses: State content correctly Electronic Signature(s) Signed: 10/05/2015 4:45:48 PM By: [name] Entered By: [name] on 10/05/2015 11:41:57 [name], [name] ([id number]) -------------------------------------------------------------------------------- Wound Assessment Details Patient Name: [name] Date of Service: 10/05/2015 10:45 AM Medical Record Number: [id number] Patient Account Number: [id number] Date of Birth/Sex: June 23, 1931 (82 y.o. Female) Treating RN: [name] Primary Care Physician: [name], [name] Other Clinician: Referring Physician: Cranford [name], [name] Treating Physician/Extender: [name] in Treatment: 1 Wound Status Wound Number: 3 Primary Trauma, Other Etiology: Wound Location: Right Lower Leg - Anterior Wound Open Wounding Event: Trauma Status: Date Acquired: 09/15/2015 Comorbid Cataracts, Hypertension,  Colitis, Type Weeks Of Treatment: 1 History: II Diabetes, Osteoarthritis Clustered Wound: No Photos Photo Uploaded By: [name] on 10/05/2015 12:06:27 Wound Measurements Length: (cm) 6 Width: (cm) 8 Depth: (cm) 0.1 Area: (cm) 37.699 Volume: (cm) 3.77 % Reduction in Area: 33.3% % Reduction in Volume: 33.3% Epithelialization: None Tunneling: No Undermining: No Wound Description Full Thickness Without Exposed Foul Odor Af Classification: Support Structures Due to Produ Diabetic Severity Grade 1 ([place]): Wound Margin: Distinct, outline attached Exudate Amount: Large Exudate Type: Serosanguineous Exudate Color: red, brown ter Cleansing: Yes ct Use: No Wound Bed Granulation Amount: Small (1-33%) Exposed Structure Granulation Quality: Red, Pink Fascia Exposed: No [name], [name] ([id number]) Necrotic Amount: Large (67-100%) Fat Layer Exposed: No Necrotic Quality: Eschar, Adherent Slough Tendon Exposed: No Muscle Exposed: No Joint Exposed: No Bone Exposed: No Limited to Skin Breakdown Periwound Skin Texture Texture Color No Abnormalities Noted: No No Abnormalities Noted: Yes Callus: No Temperature / Pain Crepitus: No Temperature: No Abnormality Excoriation: No Fluctuance: No Friable: No Induration: No Localized Edema: Yes Rash: No Scarring: No Moisture No Abnormalities Noted: No Dry / Scaly: No Maceration: No Moist: Yes Wound Preparation Ulcer Cleansing: Rinsed/Irrigated with Saline Topical Anesthetic Applied: Other: lidocaine 4%, Treatment Notes Wound #3 (Right, Anterior Lower Leg) 1. Cleansed with: Clean wound with Normal Saline 2. Anesthetic Topical Lidocaine 4% cream to wound bed prior to debridement 4. Dressing Applied: Santyl Ointment 5. Secondary Dressing Applied ABD Pad Dry Gauze Kerlix/Conform 7. Secured with Tape Notes netting [name], [name] ([id number]) Electronic Signature(s) Signed: 10/05/2015 4:45:48 PM By:  [name] Entered By: [name] on 10/05/2015 11:11:41 [name], [name] ([id number]) -------------------------------------------------------------------------------- Vitals Details Patient Name: [name] Date of Service: 10/05/2015 10:45 AM Medical Record Number: [id number] Patient Account Number: [id number] Date of Birth/Sex: Apr 22, 1931 (82 y.o. Female) Treating RN: [name] Primary Care Physician: [name] Other Clinician: Referring Physician: Cranford [name], [name] Treating Physician/Extender: [name] in Treatment: 1 Vital Signs Time Taken: 11:01 Temperature (F): 97.5 Height (in): 61 Pulse (bpm): 65 Weight (lbs):  170 Respiratory Rate (breaths/min): 20 Body Mass Index (BMI): 32.1 Blood Pressure (mmHg): 143/73 Reference Range: 80 - 120 mg / dl Electronic Signature(s) Signed: 10/05/2015 4:45:48 PM By: [name] Entered By: [name] on 10/05/2015 11:02:55

## 2015-10-06 NOTE — Progress Notes (Signed)
DEJANAY, BURROWS ([id number]) Visit Report for 10/05/2015 Chief Complaint Document Details Patient Name: [name], [name] 10/05/2015 10:45 Date of Service: AM Medical Record [id number] Number: Patient Account Number: [id number] 14-Dec-1931 (82 y.o. Treating RN: Ahmed Prima Date of Birth/Sex: Female) Other Clinician: Primary Care Physician: Cranford Mon, RICHARD Treating Christin Fudge Referring Physician: Wilhemena Durie Physician/Extender: Suella Grove in Treatment: 1 Information Obtained from: Patient Chief Complaint Patient presents to the wound care center for a consult due non healing wound to the right lower extremity which she's had for about 3 weeks now Electronic Signature(s) Signed: 10/05/2015 12:24:37 PM By: Christin Fudge MD, FACS Entered By: Christin Fudge on 10/05/2015 12:24:37 [name] ([id number]) -------------------------------------------------------------------------------- Debridement Details Patient Name: [name] 10/05/2015 10:45 Date of Service: AM Medical Record [id number] Number: Patient Account Number: [id number] 1931-12-26 (82 y.o. Treating RN: Ahmed Prima Date of Birth/Sex: Female) Other Clinician: Primary Care Physician: Cranford Mon, RICHARD Treating Joie Reamer Referring Physician: Wilhemena Durie Physician/Extender: Suella Grove in Treatment: 1 Debridement Performed for Wound #3 Right,Anterior Lower Leg Assessment: Performed By: Physician Christin Fudge, MD Debridement: Debridement Pre-procedure Yes Verification/Time Out Taken: Start Time: 11:28 Pain Control: Other : lidocaine 4% cream Level: Skin/Subcutaneous Tissue Total Area Debrided (L x 6 (cm) x 6 (cm) = 36 (cm) W): Tissue and other Viable, Non-Viable, Exudate, Fibrin/Slough, Subcutaneous material debrided: Instrument: Forceps, Scissors Bleeding: Minimum Hemostasis Achieved: Pressure End Time: 11:36 Procedural Pain: 10 Post Procedural Pain: 8 Post  Debridement Measurements of Total Wound Length: (cm) 8 Width: (cm) 8.2 Depth: (cm) 0.3 Volume: (cm) 15.457 Post Procedure Diagnosis Same as Pre-procedure Notes large lacerated wound for suture and steri-strip removal -- forcep and scissors the sutures were removed and a lot of necrotic debris sharply dissected away from the subcutaneous area. Bleeding control with pressure. Electronic Signature(s) Signed: 10/05/2015 12:23:57 PM By: Christin Fudge MD, FACS Signed: 10/05/2015 4:45:48 PM By: Levin Bacon, Clemens Catholic ([id number]) Entered By: Christin Fudge on 10/05/2015 12:23:57 [name], [name] ([id number]) -------------------------------------------------------------------------------- HPI Details Patient Name: [name], [name] 10/05/2015 10:45 Date of Service: AM Medical Record [id number] Number: Patient Account Number: [id number] 02-12-1932 (82 y.o. Treating RN: Ahmed Prima Date of Birth/Sex: Female) Other Clinician: Primary Care Physician: Cranford Mon, RICHARD Treating Christin Fudge Referring Physician: Wilhemena Durie Physician/Extender: Suella Grove in Treatment: 1 History of Present Illness Location: lacerated wound to the right lower extremity Quality: Patient reports experiencing a dull pain to affected area(s). Severity: Patient states wound (s) are getting better. Duration: Patient has had the wound for < 3 weeks prior to presenting for treatment Timing: Pain in wound is Intermittent (comes and goes Context: The wound occurred when the patient had a blunt injury which caused a laceration to the right lower extremity Modifying Factors: Other treatment(s) tried include:was seen in the ER on 09/15/2015 and sutured with 4-0 nylon sutures. Associated Signs and Symptoms: Patient reports having increase swelling. HPI Description: 82 year old patient who had an injury to her right lower extremity on July 8 while she was in [place] boarding a plane. She was  treated at [facility] with sutures being placed on 09/15/2015. She was recently seen last week by her PCP edema of the feet and legs and after review as advised to take Lasix 20 mg daily. past hemoglobin A1c done in June 2017 was 9.1% Her past medical history is significant for diabetes mellitus, arthritis, basal cell carcinoma, collagenous colitis, essential hypertension, avitaminosis D. She is also status post abdominal hysterectomy, lumbar discectomy, neck surgery. She is a  former smoker and quit in 1979. Of note in the recent past she was seen by Dr. Lucky Cowboy for what seems a sounds like injections of varicose veins. This was only around her ankles and not in the lower extremities. No Doppler studies were done. She was not advised to wear any compression stockings. Electronic Signature(s) Signed: 10/05/2015 12:30:46 PM By: Christin Fudge MD, FACS Entered By: Christin Fudge on 10/05/2015 12:30:45 [name], [name] ([id number]) -------------------------------------------------------------------------------- Physical Exam Details Patient Name: [name], [name] 10/05/2015 10:45 Date of Service: AM Medical Record [id number] Number: Patient Account Number: [id number] Jan 28, 1932 (82 y.o. Treating RN: Ahmed Prima Date of Birth/Sex: Female) Other Clinician: Primary Care Physician: Cranford Mon, RICHARD Treating Christin Fudge Referring Physician: Cranford Mon, Delfino Lovett Physician/Extender: Suella Grove in Treatment: 1 Constitutional . Pulse regular. Respirations normal and unlabored. Afebrile. . Eyes Nonicteric. Reactive to light. Ears, Nose, Mouth, and Throat Lips, teeth, and gums WNL.Marland Kitchen Moist mucosa without lesions. Neck supple and nontender. No palpable supraclavicular or cervical adenopathy. Normal sized without goiter. Respiratory WNL. No retractions.. Cardiovascular Pedal Pulses WNL. No clubbing, cyanosis or edema. Lymphatic No adneopathy. No adenopathy. No adenopathy. Musculoskeletal Adexa  without tenderness or enlargement.. Digits and nails w/o clubbing, cyanosis, infection, petechiae, ischemia, or inflammatory conditions.. Integumentary (Hair, Skin) No suspicious lesions. No crepitus or fluctuance. No peri-wound warmth or erythema. No masses.Marland Kitchen Psychiatric Judgement and insight Intact.. No evidence of depression, anxiety, or agitation.. Notes She has a lacerated wound with significant amount of subcutaneous debris on the right lower lateral calf with several sutures placed through Steri-Strips at the edge of the skin. The large lacerated wound for suture and steri-strip removal -- forcep and scissors the sutures were removed and a lot of necrotic debris sharply dissected away from the subcutaneous area. Bleeding control with pressure. Electronic Signature(s) Signed: 10/05/2015 12:33:14 PM By: Christin Fudge MD, FACS Entered By: Christin Fudge on 10/05/2015 12:33:14 [name], [name] ([id number]) -------------------------------------------------------------------------------- Physician Orders Details Patient Name: [name], [name] 10/05/2015 10:45 Date of Service: AM Medical Record [id number] Number: Patient Account Number: [id number] 18-Mar-1931 (82 y.o. Treating RN: Ahmed Prima Date of Birth/Sex: Female) Other Clinician: Primary Care Physician: Cranford Mon, RICHARD Treating Christin Fudge Referring Physician: Wilhemena Durie Physician/Extender: Suella Grove in Treatment: 1 Verbal / Phone Orders: Yes Clinician: Pinkerton, Debi Read Back and Verified: Yes Diagnosis Coding Wound Cleansing Wound #3 Right,Anterior Lower Leg o Clean wound with Normal Saline. o Cleanse wound with mild soap and water o May Shower, gently pat wound dry prior to applying new dressing. Anesthetic Wound #3 Right,Anterior Lower Leg o Topical Lidocaine 4% cream applied to wound bed prior to debridement - for clinic use Primary Wound Dressing Wound #3 Right,Anterior Lower Leg o  Santyl Ointment Secondary Dressing Wound #3 Right,Anterior Lower Leg o ABD pad o Conform/Kerlix - conform netting and tape o Non-adherent pad Dressing Change Frequency Wound #3 Right,Anterior Lower Leg o Change dressing every day. - may change more if needed due to drainage Follow-up Appointments Wound #3 Right,Anterior Lower Leg o Return Appointment in 1 week. Additional Orders / Instructions Wound #3 Right,Anterior Lower Leg o Increase protein intake. [name], [name] ([id number]) Medications-please add to medication list. Wound #3 Right,Anterior Lower Leg o Other: - Vitamin A, Vitamin C, Zinc, Multivitamin Patient Medications Allergies: morphine HCl, penicillin Notifications Medication Indication Start End Santyl 10/05/2015 DOSE topical 250 unit/gram ointment - ointment topical apply daily as directed Electronic Signature(s) Signed: 10/05/2015 12:32:03 PM By: Christin Fudge MD, FACS Entered By: Christin Fudge on 10/05/2015 12:32:03 Maione,  SHERLON CARDA ([id number]) -------------------------------------------------------------------------------- Problem List Details Patient Name: [name], [name] 10/05/2015 10:45 Date of Service: AM Medical Record [id number] Number: Patient Account Number: [id number] September 24, 1931 (82 y.o. 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 Treating RN: Ahmed Prima Date of Birth/Sex: Female) Other Clinician: Primary Care Physician: Cranford Mon, RICHARD Treating Christin Fudge Referring Physician: Wilhemena Durie Physician/Extender: Suella Grove in Treatment: 1 Subjective Chief Complaint Information obtained from Patient Patient presents to the wound care center for a consult due non healing wound to the right lower extremity which she's had for about 3 weeks now History of Present Illness (HPI) The following HPI elements were documented for the patient's wound: Location: lacerated wound to the right lower extremity Quality: Patient reports experiencing a dull pain to affected area(s). Severity: Patient states wound (s) are getting better. Duration: Patient has had the wound for < 3 weeks prior to presenting for treatment Timing: Pain in wound is Intermittent (comes and goes Context: The wound occurred when the patient had a blunt injury which caused a laceration to the right lower extremity Modifying Factors: Other treatment(s) tried include:was seen in the ER on 09/15/2015 and sutured with 4-0 nylon sutures. Associated Signs and Symptoms: Patient reports having increase swelling. 82 year old patient who had an injury to her right lower extremity on July 8 while she was in [place] boarding a plane. She was treated at [facility] with sutures being placed on 09/15/2015. She was recently seen last week by her PCP edema of the feet and legs and after review as  advised to take Lasix 20 mg daily. past hemoglobin A1c done in June 2017 was 9.1% Her past medical history is significant for diabetes mellitus, arthritis, basal cell carcinoma, collagenous colitis, essential hypertension, avitaminosis D. She is also status post abdominal hysterectomy, lumbar discectomy, neck surgery. She is a former smoker and quit in 1979. Of note in the recent past she was seen by Dr. Lucky Cowboy for what seems a sounds like injections of varicose veins. This was only around her ankles and not in the lower extremities. No Doppler studies were done. She was not advised to wear any compression stockings. Medications [name], [name] ([id number]) Fish Oil oral unspecified oral one time daily Vitamin D3 oral unspecified oral one time daily lisinopril 10 mg-hydrochlorothiazide 12.5 mg tablet oral 1 1 tablet oral daily metformin 500 mg tablet oral 1 1 tablet oral two times daily glipizide ER 2.5 mg tablet, extended release 24 hr oral 1 1 tablet extended release 24hr oral daily Zocor 20 mg tablet oral 1 1 tablet oral daily budesonide DR - ER 3 mg capsule,delayed,extended release oral 3 3 capsules (9 mg.), delayed,extend.release oral one time daily aspirin 81 mg tablet,delayed release oral 1 1  tablet,delayed release (DR/EC) oral daily Santyl 250 unit/gram topical ointment topical ointment topical apply daily as directed Objective Constitutional Pulse regular. Respirations normal and unlabored. Afebrile. Vitals Time Taken: 11:01 AM, Height: 61 in, Weight: 170 lbs, BMI: 32.1, Temperature: 97.5 F, Pulse: 65 bpm, Respiratory Rate: 20 breaths/min, Blood Pressure: 143/73 mmHg. Eyes Nonicteric. Reactive to light. Ears, Nose, Mouth, and Throat Lips, teeth, and gums WNL.Marland Kitchen Moist mucosa without lesions. Neck supple and nontender. No palpable supraclavicular or cervical adenopathy. Normal sized without goiter. Respiratory WNL. No retractions.. Cardiovascular Pedal Pulses WNL. No  clubbing, cyanosis or edema. Lymphatic No adneopathy. No adenopathy. No adenopathy. Musculoskeletal Adexa without tenderness or enlargement.. Digits and nails w/o clubbing, cyanosis, infection, petechiae, ischemia, or inflammatory conditions.Marland Kitchen Psychiatric Judgement and insight Intact.. No evidence of depression, anxiety, or agitation.Marland Kitchen [name], [name] R. ([id number]) General Notes: She has a lacerated wound with significant amount of subcutaneous debris on the right lower lateral calf with several sutures placed through Steri-Strips at the edge of the skin. The large lacerated wound for suture and steri-strip removal -- forcep and scissors the sutures were removed and a lot of necrotic debris sharply dissected away from the subcutaneous area. Bleeding control with pressure. Integumentary (Hair, Skin) No suspicious lesions. No crepitus or fluctuance. No peri-wound warmth or erythema. No masses.. Wound #3 status is Open. Original cause of wound was Trauma. The wound is located on the Right,Anterior Lower Leg. The wound measures 6cm length x 8cm width x 0.1cm depth; 37.699cm^2 area and 3.77cm^3 volume. The wound is limited to skin breakdown. There is no tunneling or undermining noted. There is a large amount of serosanguineous drainage noted. The wound margin is distinct with the outline attached to the wound base. There is small (1-33%) red, pink granulation within the wound bed. There is a large (67-100%) amount of necrotic tissue within the wound bed including Eschar and Adherent Slough. The periwound skin appearance had no abnormalities noted for color. The periwound skin appearance exhibited: Localized Edema, Moist. The periwound skin appearance did not exhibit: Callus, Crepitus, Excoriation, Fluctuance, Friable, Induration, Rash, Scarring, Dry/Scaly, Maceration. Periwound temperature was noted as No Abnormality. Assessment Active Problems ICD-10 E11.622 - Type 2 diabetes mellitus with  other skin ulcer S81.811A - Laceration without foreign body, right lower leg, initial encounter L97.212 - Non-pressure chronic ulcer of right calf with fat layer exposed I87.311 - Chronic venous hypertension (idiopathic) with ulcer of right lower extremity After cleaning the wound well and removing all the sutures and Steri-Strips I have thoroughly debrided all the necrotic debris. I have recommended: 1. In the wound daily with soap and water and applying Santyl ointment and a gauze and Kerlix dressing over this. 2. Elevation of the limbs as much as possible 3. control of her diabetes mellitus 4. Protein supplements, vitamin A, vitamin C and zinc 5. Regular visits to the wound care center. She and another daughter at the bedside have had all questions answered and will be compliant Bascom, Kashayla R. ([id number]) Procedures Wound #3 Wound #3 is a Trauma, Other located on the Right,Anterior Lower Leg . There was a Skin/Subcutaneous Tissue Debridement [id number]) debridement with total area of 36 sq cm performed by Christin Fudge, MD. with the following instrument(s): Forceps and Scissors to remove Viable and Non-Viable tissue/material including Exudate, Fibrin/Slough, and Subcutaneous after achieving pain control using Other (lidocaine 4% cream). A time out was conducted prior to the start of the procedure. A Minimum amount of bleeding was controlled with Pressure. The patient tolerated the  procedure with a pain level of 10 throughout and a pain level of 8 following the procedure. Post Debridement Measurements: 8cm length x 8.2cm width x 0.3cm depth; 15.457cm^3 volume. Post procedure Diagnosis Wound #3: Same as Pre-Procedure General Notes: large lacerated wound for suture and steri-strip removal -- forcep and scissors the sutures were removed and a lot of necrotic debris sharply dissected away from the subcutaneous area. Bleeding control with pressure.. Plan Wound Cleansing: Wound #3  Right,Anterior Lower Leg: Clean wound with Normal Saline. Cleanse wound with mild soap and water May Shower, gently pat wound dry prior to applying new dressing. Anesthetic: Wound #3 Right,Anterior Lower Leg: Topical Lidocaine 4% cream applied to wound bed prior to debridement - for clinic use Primary Wound Dressing: Wound #3 Right,Anterior Lower Leg: Santyl Ointment Secondary Dressing: Wound #3 Right,Anterior Lower Leg: ABD pad Conform/Kerlix - conform netting and tape Non-adherent pad Dressing Change Frequency: Wound #3 Right,Anterior Lower Leg: Change dressing every day. - may change more if needed due to drainage Follow-up Appointments: Wound #3 Right,Anterior Lower Leg: Return Appointment in 1 week. Additional Orders / Instructions: Wound #3 Right,Anterior Lower Leg: Increase protein intake. Medications-please add to medication list.: Wound #3 Right,Anterior Lower Leg: Arnett, Raphaela R. ([id number]) Other: - Vitamin A, Vitamin C, Zinc, Multivitamin The following medication(s) was prescribed: Santyl topical 250 unit/gram ointment ointment topical apply daily as directed starting 10/05/2015 After cleaning the wound well and removing all the sutures and Steri-Strips I have thoroughly debrided all the necrotic debris. I have recommended: 1. In the wound daily with soap and water and applying Santyl ointment and a gauze and Kerlix dressing over this. 2. Elevation of the limbs as much as possible 3. control of her diabetes mellitus 4. Protein supplements, vitamin A, vitamin C and zinc 5. Regular visits to the wound care center. She and another daughter at the bedside have had all questions answered and will be compliant Electronic Signature(s) Signed: 10/05/2015 12:35:31 PM By: Christin Fudge MD, FACS Entered By: Christin Fudge on 10/05/2015 12:35:31 KYESHA, CARBINE ([id number]) -------------------------------------------------------------------------------- SuperBill  Details Patient Name: [name] Date of Service: 10/05/2015 Medical Record Number: [id number] Patient Account Number: [id number] Date of Birth/Sex: 1931/11/23 (82 y.o. Female) Treating RN: Carolyne Fiscal, Debi Primary Care Physician: Cranford Mon, Delfino Lovett Other Clinician: Referring Physician: Cranford Mon, Delfino Lovett Treating Physician/Extender: Frann Rider in Treatment: 1 Diagnosis Coding ICD-10 Codes Code Description E11.622 Type 2 diabetes mellitus with other skin ulcer S81.811A Laceration without foreign body, right lower leg, initial encounter L97.212 Non-pressure chronic ulcer of right calf with fat layer exposed I87.311 Chronic venous hypertension (idiopathic) with ulcer of right lower extremity Facility Procedures CPT4 Code Description: [id number] - DEB SUBQ TISSUE 20 SQ CM/< ICD-10 Description Diagnosis E11.622 Type 2 diabetes mellitus with other skin ulcer S81.811A Laceration without foreign body, right lower leg, initi L97.212 Non-pressure chronic ulcer of  right calf with fat layer I87.311 Chronic venous hypertension (idiopathic) with ulcer of Modifier: al encounte exposed right lower Quantity: 1 r extremity CPT4 Code Description: [id number] - DEB SUBQ TISS EA ADDL 20CM ICD-10 Description Diagnosis E11.622 Type 2 diabetes mellitus with other skin ulcer S81.811A Laceration without foreign body, right lower leg, initi L97.212 Non-pressure chronic ulcer of  right calf with fat layer I87.311 Chronic venous hypertension (idiopathic) with ulcer of Modifier: al encounte exposed right lower Quantity: 1 r extremity Physician Procedures CPT4 Code Description: [id number] - WC PHYS SUBQ TISS 20 SQ CM ICD-10 Description Diagnosis E11.622 Type 2 diabetes  mellitus with other skin ulcer S81.811A Laceration without foreign body, right lower leg, initi L97.212 Non-pressure chronic ulcer of  right calf with fat layer I87.311 Chronic venous hypertension (idiopathic) with ulcer of  Kulkarni, Honey R. ([id number]) Modifier: al encounte exposed right lower Quantity: 1 r extremity Electronic Signature(s) Signed: 10/05/2015 12:35:49 PM By: Christin Fudge MD, FACS Entered By: Christin Fudge on 10/05/2015 12:35:49

## 2015-10-12 ENCOUNTER — Encounter: Payer: Medicare Other | Attending: Surgery | Admitting: Surgery

## 2015-10-12 DIAGNOSIS — L97212 Non-pressure chronic ulcer of right calf with fat layer exposed: Secondary | ICD-10-CM | POA: Diagnosis not present

## 2015-10-12 DIAGNOSIS — Z87891 Personal history of nicotine dependence: Secondary | ICD-10-CM | POA: Insufficient documentation

## 2015-10-12 DIAGNOSIS — I10 Essential (primary) hypertension: Secondary | ICD-10-CM | POA: Diagnosis not present

## 2015-10-12 DIAGNOSIS — I87311 Chronic venous hypertension (idiopathic) with ulcer of right lower extremity: Secondary | ICD-10-CM | POA: Diagnosis not present

## 2015-10-12 DIAGNOSIS — E559 Vitamin D deficiency, unspecified: Secondary | ICD-10-CM | POA: Diagnosis not present

## 2015-10-12 DIAGNOSIS — M1990 Unspecified osteoarthritis, unspecified site: Secondary | ICD-10-CM | POA: Insufficient documentation

## 2015-10-12 DIAGNOSIS — Z79899 Other long term (current) drug therapy: Secondary | ICD-10-CM | POA: Insufficient documentation

## 2015-10-12 DIAGNOSIS — S81811A Laceration without foreign body, right lower leg, initial encounter: Secondary | ICD-10-CM | POA: Insufficient documentation

## 2015-10-12 DIAGNOSIS — X58XXXA Exposure to other specified factors, initial encounter: Secondary | ICD-10-CM | POA: Insufficient documentation

## 2015-10-12 DIAGNOSIS — E11622 Type 2 diabetes mellitus with other skin ulcer: Secondary | ICD-10-CM | POA: Insufficient documentation

## 2015-10-13 NOTE — Progress Notes (Signed)
MAGDLINE, MAKI ([id number]) Visit Report for 10/12/2015 Chief Complaint Document Details Patient Name: [name], [name] Date of Service: 10/12/2015 10:45 AM Medical Record Number: [id number] Patient Account Number: [id number] Date of Birth/Sex: [birth year] (82 y.o. Female) Treating RN: Carolyne Fiscal, Debi Primary Care Physician: Wilhemena Durie Other Clinician: Referring Physician: Wilhemena Durie Treating Physician/Extender: Frann Rider in Treatment: 2 Information Obtained from: Patient Chief Complaint Patient presents to the wound care center for a consult due non healing wound to the right lower extremity which she's had for about 3 weeks now Electronic Signature(s) Signed: 10/12/2015 11:45:33 AM By: Christin Fudge MD, FACS Entered By: Christin Fudge on 10/12/2015 11:45:33 [name] ([id number]) -------------------------------------------------------------------------------- Debridement Details Patient Name: [name] Date of Service: 10/12/2015 10:45 AM Medical Record Number: [id number] Patient Account Number: [id number] Date of Birth/Sex: 1932/02/08 (82 y.o. Female) Treating RN: Carolyne Fiscal, Debi Primary Care Physician: Cranford Mon, Delfino Lovett Other Clinician: Referring Physician: Wilhemena Durie Treating Physician/Extender: Frann Rider in Treatment: 2 Debridement Performed for Wound #3 Right,Anterior Lower Leg Assessment: Performed By: Physician Christin Fudge, MD Debridement: Debridement Pre-procedure Yes Verification/Time Out Taken: Start Time: 11:33 Pain Control: Lidocaine 4% Topical Solution Level: Skin/Subcutaneous Tissue Total Area Debrided (L x 6 (cm) x 5 (cm) = 30 (cm) W): Tissue and other Viable, Non-Viable, Eschar, Fibrin/Slough, Subcutaneous material debrided: Instrument: Curette Bleeding: Minimum Hemostasis Achieved: Pressure End Time: 11:36 Procedural Pain: 0 Post Procedural Pain: 0 Response to Treatment: Procedure was  tolerated well Post Debridement Measurements of Total Wound Length: (cm) 6.2 Width: (cm) 5.2 Depth: (cm) 0.3 Volume: (cm) 7.596 Post Procedure Diagnosis Same as Pre-procedure Electronic Signature(s) Signed: 10/12/2015 11:45:26 AM By: Christin Fudge MD, FACS Signed: 10/12/2015 4:13:59 PM By: Alric Quan Previous Signature: 10/12/2015 11:45:00 AM Version By: Christin Fudge MD, FACS Entered By: Christin Fudge on 10/12/2015 11:45:26 [name], [name] ([id number]) -------------------------------------------------------------------------------- HPI Details Patient Name: [name] Date of Service: 10/12/2015 10:45 AM Medical Record Number: [id number] Patient Account Number: [id number] Date of Birth/Sex: May 02, 1931 (82 y.o. Female) Treating RN: Carolyne Fiscal, Debi Primary Care Physician: Wilhemena Durie Other Clinician: Referring Physician: Wilhemena Durie Treating Physician/Extender: Frann Rider in Treatment: 2 History of Present Illness Location: lacerated wound to the right lower extremity Quality: Patient reports experiencing a dull pain to affected area(s). Severity: Patient states wound (s) are getting better. Duration: Patient has had the wound for < 3 weeks prior to presenting for treatment Timing: Pain in wound is Intermittent (comes and goes Context: The wound occurred when the patient had a blunt injury which caused a laceration to the right lower extremity Modifying Factors: Other treatment(s) tried include:was seen in the ER on 09/15/2015 and sutured with 4-0 nylon sutures. Associated Signs and Symptoms: Patient reports having increase swelling. HPI Description: 82 year old patient who had an injury to her right lower extremity on July 8 while she was in [place] boarding a plane. She was treated at [facility] with sutures being placed on 09/15/2015. She was recently seen last week by her PCP edema of the feet and legs and after review as advised to take Lasix  20 mg daily. past hemoglobin A1c done in June 2017 was 9.1% Her past medical history is significant for diabetes mellitus, arthritis, basal cell carcinoma, collagenous colitis, essential hypertension, avitaminosis D. She is also status post abdominal hysterectomy, lumbar discectomy, neck surgery. She is a former smoker and quit in 1979. Of note in the recent past she was seen by Dr. Lucky Cowboy for what seems a  sounds like injections of varicose veins. This was only around her ankles and not in the lower extremities. No Doppler studies were done. She was not advised to wear any compression stockings. Electronic Signature(s) Signed: 10/12/2015 11:45:40 AM By: Christin Fudge MD, FACS Entered By: Christin Fudge on 10/12/2015 11:45:40 [name], [name] ([id number]) -------------------------------------------------------------------------------- Physical Exam Details Patient Name: [name] Date of Service: 10/12/2015 10:45 AM Medical Record Number: [id number] Patient Account Number: [id number] Date of Birth/Sex: 09/17/1931 (82 y.o. Female) Treating RN: Carolyne Fiscal, Debi Primary Care Physician: Wilhemena Durie Other Clinician: Referring Physician: Cranford Mon, RICHARD Treating Physician/Extender: Frann Rider in Treatment: 2 Constitutional . Pulse regular. Respirations normal and unlabored. Afebrile. . Eyes Nonicteric. Reactive to light. Ears, Nose, Mouth, and Throat Lips, teeth, and gums WNL.Marland Kitchen Moist mucosa without lesions. Neck supple and nontender. No palpable supraclavicular or cervical adenopathy. Normal sized without goiter. Respiratory WNL. No retractions.. Cardiovascular Pedal Pulses WNL. No clubbing, cyanosis or edema. Lymphatic No adneopathy. No adenopathy. No adenopathy. Musculoskeletal Adexa without tenderness or enlargement.. Digits and nails w/o clubbing, cyanosis, infection, petechiae, ischemia, or inflammatory conditions.. Integumentary (Hair, Skin) No suspicious  lesions. No crepitus or fluctuance. No peri-wound warmth or erythema. No masses.Marland Kitchen Psychiatric Judgement and insight Intact.. No evidence of depression, anxiety, or agitation.. Notes Wound continues to have a lot of subcutaneous debris and with a forcep I was [name] to remove a lot of it and then used a #3 curet and without causing too much of pain got a good amount of the necrotic debris and subcutaneous tissue out. Bleeding controlled with pressure. Electronic Signature(s) Signed: 10/12/2015 12:10:57 PM By: Christin Fudge MD, FACS Entered By: Christin Fudge on 10/12/2015 12:10:56 [name], [name] ([id number]) -------------------------------------------------------------------------------- Physician Orders Details Patient Name: [name] Date of Service: 10/12/2015 10:45 AM Medical Record Number: [id number] Patient Account Number: [id number] Date of Birth/Sex: 27-Mar-1931 (82 y.o. Female) Treating RN: Montey Hora Primary Care Physician: Cranford Mon, Delfino Lovett Other Clinician: Referring Physician: Cranford Mon, RICHARD Treating Physician/Extender: Frann Rider in Treatment: 2 Verbal / Phone Orders: Yes Clinician: Montey Hora Read Back and Verified: Yes Diagnosis Coding Wound Cleansing Wound #3 Right,Anterior Lower Leg o Clean wound with Normal Saline. o Cleanse wound with mild soap and water o May Shower, gently pat wound dry prior to applying new dressing. Anesthetic Wound #3 Right,Anterior Lower Leg o Topical Lidocaine 4% cream applied to wound bed prior to debridement - for clinic use Primary Wound Dressing Wound #3 Right,Anterior Lower Leg o Santyl Ointment Secondary Dressing Wound #3 Right,Anterior Lower Leg o ABD pad o Conform/Kerlix - conform netting and tape o Non-adherent pad Dressing Change Frequency Wound #3 Right,Anterior Lower Leg o Change dressing every day. - may change more if needed due to drainage Follow-up Appointments Wound  #3 Right,Anterior Lower Leg o Return Appointment in 1 week. Additional Orders / Instructions Wound #3 Right,Anterior Lower Leg o Increase protein intake. Medications-please add to medication list. Wound #3 Right,Anterior Lower Leg [name], [name] ([id number]) o Santyl Enzymatic Ointment o Other: - Vitamin A, Vitamin C, Zinc, Multivitamin Electronic Signature(s) Signed: 10/12/2015 4:14:16 PM By: Christin Fudge MD, FACS Signed: 10/12/2015 4:47:48 PM By: Montey Hora Entered By: Montey Hora on 10/12/2015 11:37:38 [name], [name] ([id number]) -------------------------------------------------------------------------------- Problem List Details Patient Name: [name] Date of Service: 10/12/2015 10:45 AM Medical Record Number: [id number] Patient Account Number: [id number] Date of Birth/Sex: 1931-12-30 (82 y.o. 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 Female) Treating RN: Carolyne Fiscal, Debi Primary Care Physician: Wilhemena Durie Other Clinician: Referring Physician: Wilhemena Durie Treating Physician/Extender: Frann Rider in Treatment: 2 Subjective Chief Complaint Information obtained from Patient Patient presents to the wound care center for a consult due non healing wound to the right lower extremity which she's had for about 3 weeks now History of Present Illness (HPI) The following HPI elements were documented for the patient's wound: Location: lacerated wound to the right lower extremity Quality: Patient reports experiencing a dull pain to affected area(s). Severity: Patient states wound (s) are getting better. Duration: Patient has had the wound for < 3 weeks prior to presenting for treatment Timing: Pain in wound is Intermittent (comes and goes Context: The wound occurred when the patient had a blunt injury which caused a laceration to the right lower extremity Modifying Factors: Other treatment(s) tried include:was seen in the ER on 09/15/2015 and sutured with 4-0 nylon sutures. Associated Signs and Symptoms: Patient reports having increase swelling. 82 year old patient who had an injury to her right lower extremity on July 8 while she was in [place] boarding a plane. She was treated at [facility] with sutures being placed on 09/15/2015. She was recently seen last week by her PCP edema of the feet and legs and after review as advised to take Lasix 20 mg daily. past hemoglobin A1c done in June 2017 was 9.1% Her past medical history is significant for diabetes mellitus, arthritis, basal cell carcinoma, collagenous colitis, essential hypertension, avitaminosis D. She is also status post abdominal hysterectomy, lumbar discectomy, neck surgery. She is a former smoker and quit in 1979. Of note in the recent past she was seen by Dr. Lucky Cowboy for what seems a sounds like injections of  varicose veins. This was only around her ankles and not in the lower extremities. No Doppler studies were done. She was not advised to wear any compression stockings. Objective [name], [name] ([id number]) Constitutional Pulse regular. Respirations normal and unlabored. Afebrile. Vitals Time Taken: 11:17 AM, Height: 61 in, Weight: 170 lbs, BMI: 32.1, Temperature: 97.8 F, Pulse: 79 bpm, Respiratory Rate: 20 breaths/min, Blood Pressure: 130/66 mmHg. Eyes Nonicteric. Reactive to light. Ears, Nose, Mouth, and Throat Lips, teeth, and gums WNL.Marland Kitchen Moist mucosa without lesions. Neck supple and nontender. No palpable supraclavicular or cervical adenopathy. Normal sized without goiter. Respiratory WNL. No retractions.. Cardiovascular Pedal Pulses WNL. No clubbing, cyanosis or edema. Lymphatic No adneopathy. No adenopathy. No adenopathy. Musculoskeletal Adexa without tenderness or enlargement.. Digits and nails w/o clubbing, cyanosis, infection, petechiae, ischemia, or inflammatory conditions.Marland Kitchen Psychiatric Judgement and insight Intact.. No evidence of depression, anxiety, or agitation.. General Notes: Wound continues to have a lot of subcutaneous debris and with a forcep I was [name] to remove a lot of it and  then used a #3 curet and without causing too much of pain got a good amount of the necrotic debris and subcutaneous tissue out. Bleeding controlled with pressure. Integumentary (Hair, Skin) No suspicious lesions. No crepitus or fluctuance. No peri-wound warmth or erythema. No masses.. Wound #3 status is Open. Original cause of wound was Trauma. The wound is located on the Right,Anterior Lower Leg. The wound measures 6.2cm length x 5.2cm width x 0.3cm depth; 25.321cm^2 area and 7.596cm^3 volume. The wound is limited to skin breakdown. There is no tunneling or undermining noted. There is a large amount of serosanguineous drainage noted. The wound margin is distinct with the outline  attached to the wound base. There is small (1-33%) red, pink granulation within the wound bed. There is a large (67-100%) amount of necrotic tissue within the wound bed including Eschar and Adherent Slough. The periwound skin appearance had no abnormalities noted for color. The periwound skin appearance exhibited: Localized Edema, Moist. The periwound skin appearance did not exhibit: Callus, Crepitus, Excoriation, Fluctuance, Friable, Induration, Rash, Scarring, Dry/Scaly, Maceration. Periwound [name], [name] ([id number]) temperature was noted as No Abnormality. 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 There was a Skin/Subcutaneous Tissue Debridement [id number]) debridement with total area of 30 sq cm performed by Christin Fudge, MD. with the following instrument(s): Curette to remove Viable and Non-Viable tissue/material including Fibrin/Slough, Eschar, and Subcutaneous after achieving pain control using Lidocaine 4% Topical Solution. A time out was conducted prior to the start of the procedure. A Minimum amount of bleeding was controlled with Pressure. The procedure was tolerated well with a pain level of 0 throughout and a pain level of 0 following the procedure. Post Debridement Measurements: 6.2cm length x 5.2cm width x 0.3cm depth; 7.596cm^3 volume. Post procedure Diagnosis Wound #3: Same as Pre-Procedure Plan Wound Cleansing: Wound #3 Right,Anterior Lower Leg: Clean wound with Normal Saline. Cleanse wound with mild soap and water May Shower, gently pat wound dry prior to applying new  dressing. Anesthetic: Wound #3 Right,Anterior Lower Leg: Topical Lidocaine 4% cream applied to wound bed prior to debridement - for clinic use Primary Wound Dressing: [name], [name]. ([id number]) Wound #3 Right,Anterior Lower Leg: Santyl Ointment Secondary Dressing: Wound #3 Right,Anterior Lower Leg: ABD pad Conform/Kerlix - conform netting and tape Non-adherent pad Dressing Change Frequency: Wound #3 Right,Anterior Lower Leg: Change dressing every day. - may change more if needed due to drainage Follow-up Appointments: Wound #3 Right,Anterior Lower Leg: Return Appointment in 1 week. Additional Orders / Instructions: Wound #3 Right,Anterior Lower Leg: Increase protein intake. Medications-please add to medication list.: Wound #3 Right,Anterior Lower Leg: Santyl Enzymatic Ointment Other: - Vitamin A, Vitamin C, Zinc, Multivitamin I have recommended: 1. Washing the wound daily with soap and water and applying Santyl ointment and a gauze and Kerlix dressing over this. 2. Elevation of the limbs as much as possible 3. control of her diabetes mellitus 4. Protein supplements, vitamin A, vitamin C and zinc 5. Regular visits to the wound care center. She and another daughter at the bedside have had all questions answered and will be compliant Electronic Signature(s) Signed: 10/12/2015 12:11:57 PM By: Christin Fudge MD, FACS Entered  By: Christin Fudge on 10/12/2015 12:11:56 [name], [name] ([id number]) -------------------------------------------------------------------------------- SuperBill Details Patient Name: [name] Date of Service: 10/12/2015 Medical Record Number: [id number] Patient Account Number: [id number] Date of Birth/Sex: 08/08/1931 (82 y.o. Female) Treating RN: Carolyne Fiscal, Debi Primary Care Physician: Cranford Mon, Delfino Lovett Other Clinician: Referring Physician: Cranford Mon, Delfino Lovett Treating Physician/Extender: Frann Rider in Treatment: 2 Diagnosis  Coding ICD-10 Codes Code Description E11.622 Type 2 diabetes mellitus with other skin ulcer S81.811A Laceration without foreign body, right lower leg, initial encounter L97.212 Non-pressure chronic ulcer of right calf with fat layer exposed I87.311 Chronic venous hypertension (idiopathic) with ulcer of right lower extremity Facility Procedures CPT4 Code Description: [id number] - DEB SUBQ TISSUE 20 SQ CM/< ICD-10 Description Diagnosis E11.622 Type 2 diabetes mellitus with other skin ulcer S81.811A Laceration without foreign body, right lower leg, initi L97.212 Non-pressure chronic ulcer of  right calf with fat layer I87.311 Chronic venous hypertension (idiopathic) with ulcer of Modifier: al encounte exposed right lower Quantity: 1 r extremity CPT4 Code Description: [id number] - DEB SUBQ TISS EA ADDL 20CM ICD-10 Description Diagnosis E11.622 Type 2 diabetes mellitus with other skin ulcer S81.811A Laceration without foreign body, right lower leg, initi L97.212 Non-pressure chronic ulcer of  right calf with fat layer I87.311 Chronic venous hypertension (idiopathic) with ulcer of Modifier: al encounte exposed right lower Quantity: 1 r extremity Physician Procedures CPT4 Code Description: [id number] - WC PHYS SUBQ TISS 20 SQ CM ICD-10 Description Diagnosis E11.622 Type 2 diabetes mellitus with other skin ulcer S81.811A Laceration without foreign body, right lower leg, initi L97.212 Non-pressure chronic ulcer of  right calf with fat layer I87.311 Chronic venous hypertension (idiopathic) with ulcer of [name], [name] ([id number]) Modifier: al encounte exposed right lower Quantity: 1 r extremity Electronic Signature(s) Signed: 10/12/2015 12:12:15 PM By: Christin Fudge MD, FACS Entered By: Christin Fudge on 10/12/2015 12:12:15

## 2015-10-13 NOTE — Progress Notes (Signed)
TAMRYN, POPKO ([id number]) Visit Report for 10/12/2015 Arrival Information Details Patient Name: [name], [name] Date of Service: 10/12/2015 10:45 AM Medical Record Number: [id number] Patient Account Number: [id number] Date of Birth/Sex: 01-24-1932 (82 y.o. Female) Treating RN: Carolyne Fiscal, Debi Primary Care Physician: Cranford Mon, Delfino Lovett Other Clinician: Referring Physician: Cranford Mon, Delfino Lovett Treating Physician/Extender: Frann Rider in Treatment: 2 Visit Information History Since Last Visit All ordered tests and consults were completed: No Patient Arrived: Ambulatory Added or deleted any medications: No Arrival Time: 11:16 Any new allergies or adverse reactions: No Accompanied By: friend Had a fall or experienced change in No Transfer Assistance: None activities of daily living that may affect Patient Identification Verified: Yes risk of falls: Secondary Verification Process Yes Signs or symptoms of abuse/neglect since last No Completed: visito Patient Has Alerts: Yes Hospitalized since last visit: No Patient Alerts: Patient on Blood Pain Present Now: Yes Thinner aspirin BMI 1.04 to rt lower leg Electronic Signature(s) Signed: 10/12/2015 4:13:59 PM By: Alric Quan Entered By: Alric Quan on 10/12/2015 11:17:20 [name] ([id number]) -------------------------------------------------------------------------------- Encounter Discharge Information Details Patient Name: [name] Date of Service: 10/12/2015 10:45 AM Medical Record Number: [id number] Patient Account Number: [id number] Date of Birth/Sex: 08-01-1931 (82 y.o. Female) Treating RN: Carolyne Fiscal, Debi Primary Care Physician: Cranford Mon, Delfino Lovett Other Clinician: Referring Physician: Cranford Mon, RICHARD Treating Physician/Extender: Frann Rider in Treatment: 2 Encounter Discharge Information Items Discharge Pain Level: 0 Discharge Condition: Stable Ambulatory Status:  Ambulatory Discharge Destination: Home Transportation: Private Auto Accompanied By: friend Schedule Follow-up Appointment: Yes Medication Reconciliation completed and provided to Patient/Care Yes Christpher Stogsdill: Provided on Clinical Summary of Care: 10/12/2015 Form Type Recipient Paper Patient [facility] Electronic Signature(s) Signed: 10/12/2015 11:47:50 AM By: Ruthine Dose Entered By: Ruthine Dose on 10/12/2015 11:47:50 [name] ([id number]) -------------------------------------------------------------------------------- Lower Extremity Assessment Details Patient Name: [name] Date of Service: 10/12/2015 10:45 AM Medical Record Number: [id number] Patient Account Number: [id number] Date of Birth/Sex: [birth year] (82 y.o. Female) Treating RN: Carolyne Fiscal, Debi Primary Care Physician: Wilhemena Durie Other Clinician: Referring Physician: Cranford Mon, RICHARD Treating Physician/Extender: Frann Rider in Treatment: 2 Vascular Assessment Pulses: Posterior Tibial Dorsalis Pedis Palpable: [Right:Yes] Extremity colors, hair growth, and conditions: Extremity Color: [Right:Normal] Temperature of Extremity: [Right:Warm] Capillary Refill: [Right:< 3 seconds] Toe Nail Assessment Left: Right: Thick: No Discolored: No Deformed: No Improper Length and Hygiene: No Electronic Signature(s) Signed: 10/12/2015 4:13:59 PM By: Alric Quan Entered By: Alric Quan on 10/12/2015 11:18:38 [place], Clemens Catholic ([id number]) -------------------------------------------------------------------------------- Multi Wound Chart Details Patient Name: [name] Date of Service: 10/12/2015 10:45 AM Medical Record Number: [id number] Patient Account Number: [id number] Date of Birth/Sex: 1931-05-08 (82 y.o. Female) Treating RN: Montey Hora Primary Care Physician: Cranford Mon, Delfino Lovett Other Clinician: Referring Physician: Cranford Mon, RICHARD Treating Physician/Extender: Frann Rider in Treatment: 2 Vital Signs Height(in): 61 Pulse(bpm): 79 Weight(lbs): 170 Blood Pressure 130/66 (mmHg): Body Mass Index(BMI): 32 Temperature(F): 97.8 Respiratory Rate 20 (breaths/min): Photos: [3:No Photos] [N/A:N/A] Wound Location: [3:Right Lower Leg - Anterior N/A] Wounding Event: [3:Trauma] [N/A:N/A] Primary Etiology: [3:Trauma, Other] [N/A:N/A] Comorbid History: [3:Cataracts, Hypertension, N/A Colitis, Type II Diabetes, Osteoarthritis] Date Acquired: [3:09/15/2015] [N/A:N/A] Weeks of Treatment: [3:2] [N/A:N/A] Wound Status: [3:Open] [N/A:N/A] Measurements L x W x D 6.2x5.2x0.3 [N/A:N/A] (cm) Area (cm) : [3:25.321] [N/A:N/A] Volume (cm) : [3:7.596] [N/A:N/A] % Reduction in Area: [3:55.20%] [N/A:N/A] % Reduction in Volume: -34.30% [N/A:N/A] Classification: [3:Full Thickness Without Exposed Support Structures] [N/A:N/A] HBO Classification: [3:Grade 1] [N/A:N/A] Exudate Amount: [3:Large] [N/A:N/A] Exudate  Type: [3:Serosanguineous] [N/A:N/A] Exudate Color: [3:red, brown] [N/A:N/A] Foul Odor After [3:Yes] [N/A:N/A] Cleansing: Odor Anticipated Due to No [N/A:N/A] Product Use: Wound Margin: [3:Distinct, outline attached N/A] Granulation Amount: [3:Small (1-33%)] [N/A:N/A] Granulation Quality: [3:Red, Pink] [N/A:N/A] Necrotic Amount: Large (67-100%) N/A N/A Necrotic Tissue: Eschar, Adherent Slough N/A N/A Exposed Structures: Fascia: No N/A N/A Fat: No Tendon: No Muscle: No Joint: No Bone: No Limited to Skin Breakdown Epithelialization: None N/A N/A Periwound Skin Texture: Edema: Yes N/A N/A Excoriation: No Induration: No Callus: No Crepitus: No Fluctuance: No Friable: No Rash: No Scarring: No Periwound Skin Moist: Yes N/A N/A Moisture: Maceration: No Dry/Scaly: No Periwound Skin Color: No Abnormalities Noted N/A N/A Temperature: No Abnormality N/A N/A Tenderness on No N/A N/A Palpation: Wound Preparation: Ulcer Cleansing: N/A  N/A Rinsed/Irrigated with Saline Topical Anesthetic Applied: Other: lidocaine 4% Treatment Notes Electronic Signature(s) Signed: 10/12/2015 4:47:48 PM By: Montey Hora Entered By: Montey Hora on 10/12/2015 11:35:27 AMORA, SHEEHY ([id number]) -------------------------------------------------------------------------------- [place] Details Patient Name: [name] Date of Service: 10/12/2015 10:45 AM Medical Record Number: [id number] Patient Account Number: [id number] Date of Birth/Sex: [birth year] (82 y.o. Female) Treating RN: Montey Hora Primary Care Physician: Cranford Mon, Delfino Lovett Other Clinician: Referring Physician: Cranford Mon, Delfino Lovett Treating Physician/Extender: Frann Rider in Treatment: 2 Active Inactive Abuse / Safety / Falls / Self Care Management Nursing Diagnoses: Potential for falls Goals: Patient/caregiver will verbalize understanding of skin care regimen Date Initiated: 09/28/2015 Goal Status: Active Patient/caregiver will verbalize/demonstrate measures taken to prevent injury and/or falls Date Initiated: 09/28/2015 Goal Status: Active Interventions: Assess fall risk on admission and as needed Assess: immobility, friction, shearing, incontinence upon admission and as needed Assess impairment of mobility on admission and as needed per policy Assess self care needs on admission and as needed Provide education on fall prevention Notes: Orientation to the Wound Care Program Nursing Diagnoses: Knowledge deficit related to the wound healing center program Goals: Patient/caregiver will verbalize understanding of the [place] Program Date Initiated: 09/28/2015 Goal Status: Active Interventions: Provide education on orientation to the wound center Notes: Tuckerman, Graziella R. ([id number]) Pain, Acute or Chronic Nursing Diagnoses: Potential alteration in comfort, pain Goals: Patient will verbalize adequate pain  control and receive pain control interventions during procedures as needed Date Initiated: 09/28/2015 Goal Status: Active Patient/caregiver will verbalize adequate pain control between visits Date Initiated: 09/28/2015 Goal Status: Active Patient/caregiver will verbalize comfort level met Date Initiated: 09/28/2015 Goal Status: Active Interventions: Assess comfort goal upon admission Complete pain assessment as per visit requirements Encourage patient to take pain medications as prescribed Notes: Wound/Skin Impairment Nursing Diagnoses: Impaired tissue integrity Goals: Ulcer/skin breakdown will heal within 14 weeks Date Initiated: 09/28/2015 Goal Status: Active Interventions: Assess patient/caregiver ability to obtain necessary supplies Assess patient/caregiver ability to perform ulcer/skin care regimen upon admission and as needed Provide education on ulcer and skin care Notes: Electronic Signature(s) Signed: 10/12/2015 4:47:48 PM By: Montey Hora Entered By: Montey Hora on 10/12/2015 11:35:17 [place], Clemens Catholic ([id number]) -------------------------------------------------------------------------------- Pain Assessment Details Patient Name: [name] Date of Service: 10/12/2015 10:45 AM Medical Record Number: [id number] Patient Account Number: [id number] Date of Birth/Sex: [birth year] (82 y.o. Female) Treating RN: Carolyne Fiscal, Debi Primary Care Physician: Wilhemena Durie Other Clinician: Referring Physician: Wilhemena Durie Treating Physician/Extender: Frann Rider in Treatment: 2 Active Problems Location of Pain Severity and Description of Pain Patient Has Paino Yes Site Locations Pain Location: Pain in Ulcers With Dressing Change: Yes Duration of the Pain. Constant /  Intermittento Constant Rate the pain. Current Pain Level: 5 Worst Pain Level: 10 Least Pain Level: 3 Character of Pain Describe the Pain: Burning, Tender, Throbbing Pain  Management and Medication Current Pain Management: Electronic Signature(s) Signed: 10/12/2015 4:13:59 PM By: Alric Quan Entered By: Alric Quan on 10/12/2015 11:17:23 [name] ([id number]) -------------------------------------------------------------------------------- Patient/Caregiver Education Details Patient Name: [name] Date of Service: 10/12/2015 10:45 AM Medical Record Number: [id number] Patient Account Number: [id number] Date of Birth/Gender: Apr 12, 1931 (82 y.o. Female) Treating RN: Carolyne Fiscal, Debi Primary Care Physician: Cranford Mon, Delfino Lovett Other Clinician: Referring Physician: Cranford Mon, RICHARD Treating Physician/Extender: Frann Rider in Treatment: 2 Education Assessment Education Provided To: Patient Education Topics Provided Wound/Skin Impairment: Handouts: Other: change dressing as ordered Methods: Demonstration, Explain/Verbal Responses: State content correctly Electronic Signature(s) Signed: 10/12/2015 4:13:59 PM By: Alric Quan Entered By: Alric Quan on 10/12/2015 11:41:18 [place]att, Clemens Catholic ([id number]) -------------------------------------------------------------------------------- Wound Assessment Details Patient Name: [name] Date of Service: 10/12/2015 10:45 AM Medical Record Number: [id number] Patient Account Number: [id number] Date of Birth/Sex: 1931-11-30 (82 y.o. Female) Treating RN: Carolyne Fiscal, Debi Primary Care Physician: Cranford Mon, Delfino Lovett Other Clinician: Referring Physician: Cranford Mon, RICHARD Treating Physician/Extender: Frann Rider in Treatment: 2 Wound Status Wound Number: 3 Primary Trauma, Other Etiology: Wound Location: Right Lower Leg - Anterior Wound Open Wounding Event: Trauma Status: Date Acquired: 09/15/2015 Comorbid Cataracts, Hypertension, Colitis, Type Weeks Of Treatment: 2 History: II Diabetes, Osteoarthritis Clustered Wound: No Photos Photo Uploaded By:  Alric Quan on 10/12/2015 16:06:22 Wound Measurements Length: (cm) 6.2 Width: (cm) 5.2 Depth: (cm) 0.3 Area: (cm) 25.321 Volume: (cm) 7.596 % Reduction in Area: 55.2% % Reduction in Volume: -34.3% Epithelialization: None Tunneling: No Undermining: No Wound Description Full Thickness Without Exposed Foul Odor Af Classification: Support Structures Due to Produ Diabetic Severity Grade 1 ([place]): Wound Margin: Distinct, outline attached Exudate Amount: Large Exudate Type: Serosanguineous Exudate Color: red, brown ter Cleansing: Yes ct Use: No Wound Bed Granulation Amount: Small (1-33%) Exposed Structure Granulation Quality: Red, Pink Fascia Exposed: No Yaworski, Adriane R. ([id number]) Necrotic Amount: Large (67-100%) Fat Layer Exposed: No Necrotic Quality: Eschar, Adherent Slough Tendon Exposed: No Muscle Exposed: No Joint Exposed: No Bone Exposed: No Limited to Skin Breakdown Periwound Skin Texture Texture Color No Abnormalities Noted: No No Abnormalities Noted: Yes Callus: No Temperature / Pain Crepitus: No Temperature: No Abnormality Excoriation: No Fluctuance: No Friable: No Induration: No Localized Edema: Yes Rash: No Scarring: No Moisture No Abnormalities Noted: No Dry / Scaly: No Maceration: No Moist: Yes Wound Preparation Ulcer Cleansing: Rinsed/Irrigated with Saline Topical Anesthetic Applied: Other: lidocaine 4%, Treatment Notes Wound #3 (Right, Anterior Lower Leg) 1. Cleansed with: Clean wound with Normal Saline 2. Anesthetic Topical Lidocaine 4% cream to wound bed prior to debridement 4. Dressing Applied: Santyl Ointment 5. Secondary Dressing Applied ABD Pad Dry Gauze Kerlix/Conform 7. Secured with Tape Notes netting TIMMI, DEVORA ([id number]) Electronic Signature(s) Signed: 10/12/2015 4:13:59 PM By: Alric Quan Entered By: Alric Quan on 10/12/2015 11:25:51 KARISHA, MARLIN  ([id number]) -------------------------------------------------------------------------------- Vitals Details Patient Name: [name] Date of Service: 10/12/2015 10:45 AM Medical Record Number: [id number] Patient Account Number: [id number] Date of Birth/Sex: 07/20/1931 (82 y.o. Female) Treating RN: Carolyne Fiscal, Debi Primary Care Physician: Cranford Mon, Delfino Lovett Other Clinician: Referring Physician: Cranford Mon, Delfino Lovett Treating Physician/Extender: Frann Rider in Treatment: 2 Vital Signs Time Taken: 11:17 Temperature (F): 97.8 Height (in): 61 Pulse (bpm): 79 Weight (lbs): 170 Respiratory Rate (breaths/min): 20 Body Mass Index (BMI): 32.1 Blood Pressure (  mmHg): 130/66 Reference Range: 80 - 120 mg / dl Electronic Signature(s) Signed: 10/12/2015 4:13:59 PM By: Alric Quan Entered By: Alric Quan on 10/12/2015 11:18:19

## 2015-10-17 ENCOUNTER — Other Ambulatory Visit: Payer: Self-pay | Admitting: Family Medicine

## 2015-10-17 DIAGNOSIS — R609 Edema, unspecified: Secondary | ICD-10-CM

## 2015-10-19 ENCOUNTER — Encounter: Payer: Medicare Other | Admitting: Surgery

## 2015-10-19 DIAGNOSIS — E11622 Type 2 diabetes mellitus with other skin ulcer: Secondary | ICD-10-CM | POA: Diagnosis not present

## 2015-10-23 ENCOUNTER — Encounter: Payer: Self-pay | Admitting: Family Medicine

## 2015-10-23 ENCOUNTER — Ambulatory Visit (INDEPENDENT_AMBULATORY_CARE_PROVIDER_SITE_OTHER): Payer: Medicare Other | Admitting: Family Medicine

## 2015-10-23 VITALS — BP 98/62 | HR 86 | Temp 97.9°F | Resp 14 | Wt 167.8 lb

## 2015-10-23 DIAGNOSIS — S81801D Unspecified open wound, right lower leg, subsequent encounter: Secondary | ICD-10-CM | POA: Diagnosis not present

## 2015-10-23 DIAGNOSIS — E119 Type 2 diabetes mellitus without complications: Secondary | ICD-10-CM

## 2015-10-23 LAB — POCT GLYCOSYLATED HEMOGLOBIN (HGB A1C): Hemoglobin A1C: 8.5

## 2015-10-23 MED ORDER — TRAMADOL HCL 50 MG PO TABS: 50.0000 mg | ORAL_TABLET | Freq: Three times a day (TID) | ORAL | 0 refills | Status: DC | PRN

## 2015-10-23 NOTE — Progress Notes (Signed)
[name]    DOB: [birth year]   82 y.o.   MRN: [id number] Visit Date: 10/23/2015  Today's Provider: [name], PA   Chief Complaint  Patient presents with  . Diabetes  . Follow-up   Subjective:    HPI  Diabetes Mellitus Type II, Follow-up:   Lab Results  Component Value Date   HGBA1C 9.1 08/15/2015   HGBA1C 8.7 05/14/2015   HGBA1C 8.6 01/01/2015    Last seen for diabetes 2 months ago.  Management since then includes started Glipizide 2.5 mg. She reports good compliance with treatment. She is not having side effects.  Current symptoms include none and have been stable. Home blood sugar records: fasting range: 120's  Episodes of hypoglycemia? no   Weight trend: stable Current diet: well balanced Current exercise: none  Pertinent Labs:    Component Value Date/Time   CHOL 169 08/16/2015 0902   TRIG 145 08/16/2015 0902   HDL 55 08/16/2015 0902   LDLCALC 85 08/16/2015 0902   CREATININE 0.87 08/16/2015 0902    Wt Readings from Last 3 Encounters:  10/23/15 167 lb 12.8 oz (76.1 kg)  10/01/15 180 lb (81.6 kg)  09/15/15 170 lb (77.1 kg)    ------------------------------------------------------------------------ Past Medical History:  Diagnosis Date  . Diabetes mellitus without complication ([place])   . Diverticulitis   . Hypertension    Past Surgical History:  Procedure Laterality Date  . ABDOMINAL HYSTERECTOMY    . BACK SURGERY  08/08/2008   Lumbar discectomy  . NECK SURGERY  1980   Family History  Problem Relation Age of Onset  . Hypertension Mother   . Diabetes Mother   . Lung cancer Father    Allergies  Allergen Reactions  . Morphine Sulfate   . Penicillins Diarrhea   Previous Medications   ASCORBIC ACID (VITAMIN C PO)    Take by mouth.   ASPIRIN 81 MG TABLET    Take by mouth.   BLOOD GLUCOSE MONITORING SUPPL (ACCU-CHEK AVIVA CONNECT) W/DEVICE KIT    ACCU-CHEK AVIVA (In Vitro Strip)  1 (one) Strip Strip daily for 0  days  Quantity: 100;  Refills: 3   Ordered :24-Oct-2013  Lynford Humphrey ;  Started 24-Oct-2013 Active Comments: Dx: 250.00 also include Accu-chek aviva meter   BUDESONIDE (ENTOCORT EC) 3 MG 24 HR CAPSULE    TAKE 3  CAPSULES  ORAL, DAILY   CALCIUM-VITAMIN D 600-200 MG-UNIT PER TABLET    Take by mouth.   CHOLECALCIFEROL (VITAMIN D) 2000 UNITS CAPS    Take by mouth.   FUROSEMIDE (LASIX) 20 MG TABLET    TAKE ONE TABLET BY MOUTH DAILY   GLIPIZIDE (GLUCOTROL XL) 2.5 MG 24 HR TABLET    Take 1 tablet (2.5 mg total) by mouth daily with breakfast.   GLUCOSE BLOOD TEST STRIP    BAYER CONTOUR TEST (In Vitro Strip)  1 (one) Strip Strip to check blood sugar daily for 0 days  Quantity: 50;  Refills: 5   Ordered :20-May-2013  Gerald Leitz ;  Started 20-May-2013 Active Comments: & Lancets   IBUPROFEN (ADVIL,MOTRIN) 200 MG TABLET    Take 600 mg by mouth 3 (three) times daily.   LISINOPRIL-HYDROCHLOROTHIAZIDE (PRINZIDE,ZESTORETIC) 10-12.5 MG TABLET    1 (ONE) TABLET, ORAL, DAILY   METFORMIN (GLUCOPHAGE) 500 MG TABLET    Take 1 tablet (500 mg total) by mouth 2 (two) times daily with a meal.   MULTIPLE VITAMIN TABLET    Take by mouth. Reported  on 03/13/2015   OMEGA-3 FATTY ACIDS PO    Take by mouth.   SIMVASTATIN (ZOCOR) 20 MG TABLET    TAKE 1 TABLET BY MOUTH AT BEDTIME   VITAMIN A PO    Take by mouth.    Review of Systems  Constitutional: Negative.   Cardiovascular: Positive for leg swelling.       Laceration on right lower leg. Patient being treated at wound clinic     Social History  Substance Use Topics  . Smoking status: Former Smoker    Quit date: 03/09/1978  . Smokeless tobacco: Never Used  . Alcohol use Yes     Comment: occasional   Objective:   BP 98/62 (BP Location: Left Arm, Patient Position: Sitting, Cuff Size: Normal)   Pulse 86   Temp 97.9 F (36.6 C) (Oral)   Resp 14   Wt 167 lb 12.8 oz (76.1 kg)   SpO2 97%   BMI 31.71 kg/m   Physical Exam  Constitutional: She is oriented to  person, place, and time. She appears well-developed and well-nourished. No distress.  HENT:  Head: Normocephalic and atraumatic.  Right Ear: Hearing normal.  Left Ear: Hearing normal.  Nose: Nose normal.  Eyes: Conjunctivae and lids are normal. Right eye exhibits no discharge. Left eye exhibits no discharge. No scleral icterus.  Neck: Neck supple.  Cardiovascular: Normal rate and regular rhythm.   Pulmonary/Chest: Effort normal and breath sounds normal. No respiratory distress.  Abdominal: Soft. Bowel sounds are normal.  Musculoskeletal: She exhibits tenderness.  Many large varicose veins with venous insufficiency both lower legs. Very tender right lower leg and shin level.  Neurological: She is alert and oriented to person, place, and time.  Skin: Skin is intact. No lesion noted.  Large open sore over the right lower shin above ankle anteriorly. Bandaged daily and followed at the [facility] weekly.  Psychiatric: She has a normal mood and affect. Her speech is normal and behavior is normal. Thought content normal.      Assessment & Plan:     1. Type 2 diabetes mellitus without complication, without long-term current use of insulin ([place]) Fair control and tolerating Metformin with Glipizide daily. Trying to follow diet. Hgb A1C 8.5% today (was 9.1% in May 2017). Continue present regimen and recheck in 3 months. - POCT HgB A1C  2. Open wound of right lower leg, subsequent encounter Slow to heal from laceration on a step getting into an airplane early July 2017. Still going to [place] weekly. Feels the wound is slowly getting smaller. Can't sleep very well because o right leg pain. Pain not adequately controlled by use of Ibuprofen 200 mg 3 tablets TID with meals. Will add Tramadol at bedtime and continue wound care follow up.  - traMADol (ULTRAM) 50 MG tablet; Take 1 tablet (50 mg total) by mouth every 8 (eight) hours as needed.  Dispense: 30 tablet; Refill: 0

## 2015-10-26 ENCOUNTER — Encounter: Payer: Self-pay | Admitting: Emergency Medicine

## 2015-10-26 ENCOUNTER — Emergency Department: Payer: Medicare Other

## 2015-10-26 ENCOUNTER — Other Ambulatory Visit: Payer: Self-pay

## 2015-10-26 ENCOUNTER — Encounter (HOSPITAL_BASED_OUTPATIENT_CLINIC_OR_DEPARTMENT_OTHER): Payer: Medicare Other | Admitting: General Surgery

## 2015-10-26 ENCOUNTER — Inpatient Hospital Stay
Admission: EM | Admit: 2015-10-26 | Discharge: 2015-10-28 | DRG: 683 | Disposition: A | Discharge status: Home / Self Care | Payer: Medicare Other | Attending: Internal Medicine | Admitting: Internal Medicine

## 2015-10-26 DIAGNOSIS — Z880 Allergy status to penicillin: Secondary | ICD-10-CM

## 2015-10-26 DIAGNOSIS — L97809 Non-pressure chronic ulcer of other part of unspecified lower leg with unspecified severity: Secondary | ICD-10-CM | POA: Diagnosis present

## 2015-10-26 DIAGNOSIS — N179 Acute kidney failure, unspecified: Principal | ICD-10-CM | POA: Diagnosis present

## 2015-10-26 DIAGNOSIS — N390 Urinary tract infection, site not specified: Secondary | ICD-10-CM | POA: Diagnosis present

## 2015-10-26 DIAGNOSIS — Z885 Allergy status to narcotic agent status: Secondary | ICD-10-CM

## 2015-10-26 DIAGNOSIS — R55 Syncope and collapse: Secondary | ICD-10-CM | POA: Diagnosis present

## 2015-10-26 DIAGNOSIS — Z90710 Acquired absence of both cervix and uterus: Secondary | ICD-10-CM | POA: Diagnosis not present

## 2015-10-26 DIAGNOSIS — Z833 Family history of diabetes mellitus: Secondary | ICD-10-CM

## 2015-10-26 DIAGNOSIS — L97212 Non-pressure chronic ulcer of right calf with fat layer exposed: Secondary | ICD-10-CM | POA: Diagnosis not present

## 2015-10-26 DIAGNOSIS — S0010XA Contusion of unspecified eyelid and periocular area, initial encounter: Secondary | ICD-10-CM | POA: Diagnosis present

## 2015-10-26 DIAGNOSIS — Z7982 Long term (current) use of aspirin: Secondary | ICD-10-CM

## 2015-10-26 DIAGNOSIS — Z87891 Personal history of nicotine dependence: Secondary | ICD-10-CM

## 2015-10-26 DIAGNOSIS — Z801 Family history of malignant neoplasm of trachea, bronchus and lung: Secondary | ICD-10-CM | POA: Diagnosis not present

## 2015-10-26 DIAGNOSIS — E11622 Type 2 diabetes mellitus with other skin ulcer: Secondary | ICD-10-CM | POA: Diagnosis present

## 2015-10-26 DIAGNOSIS — E860 Dehydration: Secondary | ICD-10-CM | POA: Diagnosis present

## 2015-10-26 DIAGNOSIS — Z7984 Long term (current) use of oral hypoglycemic drugs: Secondary | ICD-10-CM | POA: Diagnosis not present

## 2015-10-26 DIAGNOSIS — Z8249 Family history of ischemic heart disease and other diseases of the circulatory system: Secondary | ICD-10-CM | POA: Diagnosis not present

## 2015-10-26 DIAGNOSIS — Z79899 Other long term (current) drug therapy: Secondary | ICD-10-CM | POA: Diagnosis not present

## 2015-10-26 DIAGNOSIS — S81811D Laceration without foreign body, right lower leg, subsequent encounter: Secondary | ICD-10-CM | POA: Diagnosis not present

## 2015-10-26 DIAGNOSIS — W19XXXA Unspecified fall, initial encounter: Secondary | ICD-10-CM | POA: Diagnosis present

## 2015-10-26 DIAGNOSIS — E1100 Type 2 diabetes mellitus with hyperosmolarity without nonketotic hyperglycemic-hyperosmolar coma (NKHHC): Secondary | ICD-10-CM

## 2015-10-26 DIAGNOSIS — I10 Essential (primary) hypertension: Secondary | ICD-10-CM | POA: Diagnosis not present

## 2015-10-26 DIAGNOSIS — X58XXXA Exposure to other specified factors, initial encounter: Secondary | ICD-10-CM | POA: Diagnosis not present

## 2015-10-26 DIAGNOSIS — E559 Vitamin D deficiency, unspecified: Secondary | ICD-10-CM | POA: Diagnosis not present

## 2015-10-26 DIAGNOSIS — I87311 Chronic venous hypertension (idiopathic) with ulcer of right lower extremity: Secondary | ICD-10-CM | POA: Diagnosis not present

## 2015-10-26 DIAGNOSIS — M1990 Unspecified osteoarthritis, unspecified site: Secondary | ICD-10-CM | POA: Diagnosis not present

## 2015-10-26 DIAGNOSIS — S81811A Laceration without foreign body, right lower leg, initial encounter: Secondary | ICD-10-CM | POA: Diagnosis not present

## 2015-10-26 LAB — URINALYSIS COMPLETE WITH MICROSCOPIC (ARMC ONLY)
Bacteria, UA: NONE SEEN
Bilirubin Urine: NEGATIVE
Glucose, UA: 50 mg/dL — AB
Hgb urine dipstick: NEGATIVE
KETONES UR: NEGATIVE mg/dL
Nitrite: NEGATIVE
PH: 5 (ref 5.0–8.0)
PROTEIN: NEGATIVE mg/dL
Specific Gravity, Urine: 1.01 (ref 1.005–1.030)

## 2015-10-26 LAB — CBC
HCT: 41.4 % (ref 35.0–47.0)
HEMOGLOBIN: 14.3 g/dL (ref 12.0–16.0)
MCH: 30.7 pg (ref 26.0–34.0)
MCHC: 34.6 g/dL (ref 32.0–36.0)
MCV: 88.6 fL (ref 80.0–100.0)
PLATELETS: 333 10*3/uL (ref 150–440)
RBC: 4.67 MIL/uL (ref 3.80–5.20)
RDW: 13.2 % (ref 11.5–14.5)
WBC: 12.2 10*3/uL — AB (ref 3.6–11.0)

## 2015-10-26 LAB — BASIC METABOLIC PANEL
Anion gap: 7 (ref 5–15)
BUN: 99 mg/dL — ABNORMAL HIGH (ref 6–20)
CHLORIDE: 103 mmol/L (ref 101–111)
CO2: 27 mmol/L (ref 22–32)
Calcium: 9.6 mg/dL (ref 8.9–10.3)
Creatinine, Ser: 2.16 mg/dL — ABNORMAL HIGH (ref 0.44–1.00)
GFR calc non Af Amer: 20 mL/min — ABNORMAL LOW (ref >60)
GFR, EST AFRICAN AMERICAN: 23 mL/min — AB (ref >60)
Glucose, Bld: 181 mg/dL — ABNORMAL HIGH (ref 65–99)
POTASSIUM: 4.3 mmol/L (ref 3.5–5.1)
SODIUM: 137 mmol/L (ref 135–145)

## 2015-10-26 LAB — TROPONIN I: Troponin I: <0.03 ng/mL (ref <0.03)

## 2015-10-26 MED ORDER — ONDANSETRON HCL 4 MG/2ML IJ SOLN: 4.0000 mg | Freq: Four times a day (QID) | INTRAMUSCULAR | Status: DC | PRN

## 2015-10-26 MED ORDER — BUDESONIDE 3 MG PO CPEP
9.0000 mg | ORAL_CAPSULE | Freq: Every day | ORAL | Status: DC
Start: 2015-10-27 — End: 2015-10-28
  Administered 2015-10-27 – 2015-10-28 (×2): 9 mg via ORAL
  Filled 2015-10-26 (×2): qty 3

## 2015-10-26 MED ORDER — ACETAMINOPHEN 650 MG RE SUPP: 650.0000 mg | Freq: Four times a day (QID) | RECTAL | Status: DC | PRN

## 2015-10-26 MED ORDER — ACETAMINOPHEN 325 MG PO TABS
650.0000 mg | ORAL_TABLET | Freq: Four times a day (QID) | ORAL | Status: DC | PRN
  Administered 2015-10-27 – 2015-10-28 (×3): 650 mg via ORAL
  Filled 2015-10-26 (×3): qty 2

## 2015-10-26 MED ORDER — SODIUM CHLORIDE 0.9 % IV BOLUS (SEPSIS)
1000.0000 mL | Freq: Once | INTRAVENOUS | Status: AC
  Administered 2015-10-26: 1000 mL via INTRAVENOUS

## 2015-10-26 MED ORDER — SIMVASTATIN 20 MG PO TABS
20.0000 mg | ORAL_TABLET | Freq: Every day | ORAL | Status: DC
  Administered 2015-10-26 – 2015-10-27 (×2): 20 mg via ORAL
  Filled 2015-10-26 (×2): qty 1

## 2015-10-26 MED ORDER — SODIUM CHLORIDE 0.9 % IV SOLN
INTRAVENOUS | Status: DC
  Administered 2015-10-26 – 2015-10-28 (×3): via INTRAVENOUS

## 2015-10-26 MED ORDER — CIPROFLOXACIN IN D5W 400 MG/200ML IV SOLN
400.0000 mg | Freq: Every day | INTRAVENOUS | Status: DC
  Administered 2015-10-26: 400 mg via INTRAVENOUS
  Filled 2015-10-26 (×2): qty 200

## 2015-10-26 MED ORDER — HEPARIN SODIUM (PORCINE) 5000 UNIT/ML IJ SOLN
5000.0000 [IU] | Freq: Three times a day (TID) | INTRAMUSCULAR | Status: DC
  Administered 2015-10-26 – 2015-10-28 (×5): 5000 [IU] via SUBCUTANEOUS
  Filled 2015-10-26 (×4): qty 1

## 2015-10-26 MED ORDER — ALBUTEROL SULFATE (2.5 MG/3ML) 0.083% IN NEBU: 2.5000 mg | INHALATION_SOLUTION | RESPIRATORY_TRACT | Status: DC | PRN

## 2015-10-26 MED ORDER — OXYCODONE-ACETAMINOPHEN 5-325 MG PO TABS
1.0000 | ORAL_TABLET | Freq: Four times a day (QID) | ORAL | Status: DC | PRN
Start: 2015-10-26 — End: 2015-10-28
  Administered 2015-10-26: 1 via ORAL
  Filled 2015-10-26: qty 2

## 2015-10-26 MED ORDER — ASPIRIN EC 81 MG PO TBEC
81.0000 mg | DELAYED_RELEASE_TABLET | Freq: Every day | ORAL | Status: DC
  Administered 2015-10-27 – 2015-10-28 (×2): 81 mg via ORAL
  Filled 2015-10-26 (×2): qty 1

## 2015-10-26 MED ORDER — ONDANSETRON HCL 4 MG PO TABS: 4.0000 mg | ORAL_TABLET | Freq: Four times a day (QID) | ORAL | Status: DC | PRN

## 2015-10-26 NOTE — ED Notes (Signed)
Pt alert,  Family with pt.

## 2015-10-26 NOTE — ED Provider Notes (Signed)
[facility] Provider Note   CSN: [id number] Arrival date & time: 10/26/15  1352     History   Chief Complaint Chief Complaint  Patient presents with  . Loss of Consciousness    HPI [name] is a 82 y.o. female hx of DM, HTN, Here presenting with fall. Patient states that he has been having poor appetite for the last month or so since she fell. She felt while going upstairs about a month ago and requires sutures. States that she has been followed up with wound care. Patient states that about 2 days ago she woke up with her alarm clock and then fell and woke up on the bathroom floor. Has some frontal headaches since then and daughter noticed worsening redness around the right eye. Patient denies any blurry vision or double vision. Patient states that her appetite has been worse since then. Denies any fevers or chills. Denies chest pain   The history is provided by the patient.    Past Medical History:  Diagnosis Date  . Diabetes mellitus without complication ([place])   . Diverticulitis   . Hypertension     Patient Active Problem List   Diagnosis Date Noted  . Laceration of right lower leg 09/28/2015  . Senile purpura ([place]) 01/01/2015  . Edema 08/15/2014  . Arthritis 07/12/2014  . Basal cell carcinoma 07/12/2014  . CC (collagenous colitis) 07/12/2014  . Essential (primary) hypertension 07/12/2014  . Hypercholesteremia 07/12/2014  . Adiposity 07/12/2014  . Acne erythematosa 07/12/2014  . Diabetes mellitus, type 2 ([place]) 07/12/2014  . 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 Avitaminosis D 07/12/2014  . H/O malignant neoplasm of skin 10/10/2011    Past Surgical History:  Procedure Laterality Date  . ABDOMINAL HYSTERECTOMY    . BACK SURGERY  08/08/2008   Lumbar discectomy  . NECK SURGERY  1980    OB History    No data available       Home Medications    Prior to Admission medications   Medication Sig Start Date End Date Taking? Authorizing Provider    Ascorbic Acid (VITAMIN C PO) Take by mouth.    Historical Provider, MD  aspirin 81 MG tablet Take by mouth. 01/02/10   Historical Provider, MD  Blood Glucose Monitoring Suppl (ACCU-CHEK AVIVA CONNECT) W/DEVICE KIT ACCU-CHEK AVIVA (In Vitro Strip)  1 (one) Strip Strip daily for 0 days  Quantity: 100;  Refills: 3   Ordered :24-Oct-2013  Lynford Humphrey ;  Started 24-Oct-2013 Active Comments: Dx: 250.00 also include Accu-chek aviva meter 10/24/13   Historical Provider, MD  budesonide (ENTOCORT EC) 3 MG 24 hr capsule TAKE 3  CAPSULES  ORAL, DAILY 07/25/15   Lucilla Lame, MD  Calcium-Vitamin D 600-200 MG-UNIT per tablet Take by mouth.    Historical Provider, MD  Cholecalciferol (VITAMIN D) 2000 UNITS CAPS Take by mouth.    Historical Provider, MD  furosemide (LASIX) 20 MG tablet TAKE ONE TABLET BY MOUTH DAILY 10/18/15   Vickki Muff Chrismon, PA  glipiZIDE (GLUCOTROL XL) 2.5 MG 24 hr tablet Take 1 tablet (2.5 mg total) by mouth daily with breakfast. 08/15/15   Margarita Rana, MD  glucose blood test strip BAYER CONTOUR TEST (In Vitro Strip)  1 (one) Strip Strip to check blood sugar daily for 0 days  Quantity: 50;  Refills: 5   Ordered :20-May-2013  Gerald Leitz ;  Started 20-May-2013 Active Comments: & Lancets 05/20/13   Historical Provider, MD  ibuprofen (ADVIL,MOTRIN) 200 MG tablet Take 600 mg by  mouth 3 (three) times daily.    Historical Provider, MD  lisinopril-hydrochlorothiazide (PRINZIDE,ZESTORETIC) 10-12.5 MG tablet 1 (ONE) TABLET, ORAL, DAILY 08/31/15   Margarita Rana, MD  metFORMIN (GLUCOPHAGE) 500 MG tablet Take 1 tablet (500 mg total) by mouth 2 (two) times daily with a meal. 08/15/15   Margarita Rana, MD  Multiple Vitamin tablet Take by mouth. Reported on 03/13/2015    Historical Provider, MD  OMEGA-3 FATTY ACIDS PO Take by mouth.    Historical Provider, MD  simvastatin (ZOCOR) 20 MG tablet TAKE 1 TABLET BY MOUTH AT BEDTIME 05/14/15   Margarita Rana, MD  traMADol (ULTRAM) 50 MG tablet Take 1 tablet (50 mg  total) by mouth every 8 (eight) hours as needed. 10/23/15   Vickki Muff Chrismon, PA  VITAMIN A PO Take by mouth.    Historical Provider, MD    Family History Family History  Problem Relation Age of Onset  . Hypertension Mother   . Diabetes Mother   . Lung cancer Father     Social History Social History  Substance Use Topics  . Smoking status: Former Smoker    Quit date: 03/09/1978  . Smokeless tobacco: Never Used  . Alcohol use Yes     Comment: occasional     Allergies   Morphine sulfate and Penicillins   Review of Systems Review of Systems  Cardiovascular: Positive for syncope.  Skin: Positive for wound.  Neurological: Positive for headaches.  All other systems reviewed and are negative.    Physical Exam Updated Vital Signs BP 125/82   Pulse 72   Temp 98.3 F (36.8 C) (Oral)   Resp 18   Ht _0  (1.549 m)   Wt 165 lb (74.8 kg)   SpO2 97%   BMI 31.18 kg/m   Physical Exam  Constitutional: She is oriented to person, place, and time. She appears well-developed and well-nourished.  HENT:  Head: Normocephalic.  + frontal hematoma with ecchymosis along the R nasal septum and around R eye. No obvious bony tenderness around the eyes or nasal septum. Extra ocular movements intact   Eyes: EOM are normal. Pupils are equal, round, and reactive to light.  Neck: Normal range of motion. Neck supple.  Cardiovascular: Normal rate, regular rhythm and normal heart sounds.   Pulmonary/Chest: Effort normal.  Crackles L base   Abdominal: Soft. Bowel sounds are normal.  Musculoskeletal: Normal range of motion.  R leg ulcer, no obvious purulent discharge, not warm or red   Neurological: She is alert and oriented to person, place, and time. No cranial nerve deficit. Coordination normal.  Psychiatric: She has a normal mood and affect.  Nursing note and vitals reviewed.    ED Treatments / Results  Labs (all labs ordered are listed, but only abnormal results are  displayed) Labs Reviewed  BASIC METABOLIC PANEL - Abnormal; Notable for the following:       Result Value   Glucose, Bld 181 (*)    BUN 99 (*)    Creatinine, Ser 2.16 (*)    GFR calc non Af Amer 20 (*)    GFR calc Af Amer 23 (*)    All other components within normal limits  CBC - Abnormal; Notable for the following:    WBC 12.2 (*)    All other components within normal limits  TROPONIN I  TROPONIN I  URINALYSIS COMPLETEWITH MICROSCOPIC ([facility] ONLY)  CBG MONITORING, ED    EKG  EKG Interpretation None  ED ECG REPORT I, Wandra Arthurs, the attending physician, personally viewed and interpreted this ECG.   Date: 10/26/2015  EKG Time: 14:28 pm  Rate: 83  Rhythm: normal EKG, normal sinus rhythm  Axis: normal  Intervals:none  ST&T Change: nonspecific    Radiology Ct Head Wo Contrast  Result Date: 10/26/2015 CLINICAL DATA:  Syncopal episode now with bruising to the nose, orbits and forehead. EXAM: CT HEAD WITHOUT CONTRAST CT CERVICAL SPINE WITHOUT CONTRAST TECHNIQUE: Multidetector CT imaging of the head and cervical spine was performed following the standard protocol without intravenous contrast. Multiplanar CT image reconstructions of the cervical spine were also generated. COMPARISON:  None. FINDINGS: CT HEAD FINDINGS There is a minimal amount of soft tissue stranding about the midline of the forehead (image 20, series 2, sagittal image 13, series 606). This finding is without associated radiopaque foreign body or displaced calvarial fracture. Normal noncontrast appearance of the bilateral orbits and globes. Post right-sided cataract surgery. No retrobulbar hematoma. Limited visualization of the paranasal sinuses and mastoid air cells is normal. No air-fluid levels. Mild atrophy with sulcal prominence centralized volume loss with commensurate ex vacuo dilatation the ventricular system. Scattered periventricular hypodensities compatible microvascular ischemic disease. Bilateral  basal ganglial calcifications. No CT evidence of acute large territory infarct. Beam hardening beam hardening artifact involving the bilateral frontal lobe cortices, right greater than left. No definite intraparenchymal or extra-axial hemorrhage. No definite intraparenchymal or extra-axial mass on this noncontrast examination. Normal configuration of the ventricles and basilar cisterns. No midline shift. There is diffuse increased attenuation intracranial blood pool suggestive of volume depletion. CT CERVICAL SPINE FINDINGS C1 to the superior endplate of T2 There is minimal (approximately 2 mm) of anterolisthesis of C7 upon T1. The dens is normally positioned between the lateral masses of C1. Mild degenerative change of the atlantodental articulation with partial ossification of the transverse ligament. Normal atlantoaxial articulations. Presumed resection of the C7 right lamina (representative axial image 58, series 5, sagittal image 20, series 603). The bilateral facets are normally aligned. Note is made of ankylosis involving in the left C2-C3 transverse process. No fracture or static subluxation of the cervical spine. Cervical vertebral body heights are preserved. Prevertebral soft tissues are normal. Moderate multilevel cervical spine DDD, worse at C5-C6 and C6-C7 with disc space height loss, endplate irregularity and sclerosis. Regional soft tissues appear normal. No bulky cervical lymphadenopathy. Normal appearance of the thyroid gland. Limited visualization of the lung apices is normal. IMPRESSION: Head CT Impression: 1. Minimal soft tissue swelling about the midline of the forehead without associated radiopaque foreign body, displaced calvarial fracture or acute intracranial process. 2. Advanced atrophy and mild microvascular ischemic disease. Cervical Spine CT Impression: 1. No fracture or static subluxation of the cervical spine. 2. Moderate multilevel cervical spine DDD, worse at C5-C6 and C6-C7. 3. Post  presumed resection of the right C7 lamina. Electronically Signed   By: Sandi Mariscal M.D.   On: 10/26/2015 16:17   Ct Cervical Spine Wo Contrast  Result Date: 10/26/2015 CLINICAL DATA:  Syncopal episode now with bruising to the nose, orbits and forehead. EXAM: CT HEAD WITHOUT CONTRAST CT CERVICAL SPINE WITHOUT CONTRAST TECHNIQUE: Multidetector CT imaging of the head and cervical spine was performed following the standard protocol without intravenous contrast. Multiplanar CT image reconstructions of the cervical spine were also generated. COMPARISON:  None. FINDINGS: CT HEAD FINDINGS There is a minimal amount of soft tissue stranding about the midline of the forehead (image 20, series 2, sagittal image  13, series 606). This finding is without associated radiopaque foreign body or displaced calvarial fracture. Normal noncontrast appearance of the bilateral orbits and globes. Post right-sided cataract surgery. No retrobulbar hematoma. Limited visualization of the paranasal sinuses and mastoid air cells is normal. No air-fluid levels. Mild atrophy with sulcal prominence centralized volume loss with commensurate ex vacuo dilatation the ventricular system. Scattered periventricular hypodensities compatible microvascular ischemic disease. Bilateral basal ganglial calcifications. No CT evidence of acute large territory infarct. Beam hardening beam hardening artifact involving the bilateral frontal lobe cortices, right greater than left. No definite intraparenchymal or extra-axial hemorrhage. No definite intraparenchymal or extra-axial mass on this noncontrast examination. Normal configuration of the ventricles and basilar cisterns. No midline shift. There is diffuse increased attenuation intracranial blood pool suggestive of volume depletion. CT CERVICAL SPINE FINDINGS C1 to the superior endplate of T2 There is minimal (approximately 2 mm) of anterolisthesis of C7 upon T1. The dens is normally positioned between the  lateral masses of C1. Mild degenerative change of the atlantodental articulation with partial ossification of the transverse ligament. Normal atlantoaxial articulations. Presumed resection of the C7 right lamina (representative axial image 58, series 5, sagittal image 20, series 603). The bilateral facets are normally aligned. Note is made of ankylosis involving in the left C2-C3 transverse process. No fracture or static subluxation of the cervical spine. Cervical vertebral body heights are preserved. Prevertebral soft tissues are normal. Moderate multilevel cervical spine DDD, worse at C5-C6 and C6-C7 with disc space height loss, endplate irregularity and sclerosis. Regional soft tissues appear normal. No bulky cervical lymphadenopathy. Normal appearance of the thyroid gland. Limited visualization of the lung apices is normal. IMPRESSION: Head CT Impression: 1. Minimal soft tissue swelling about the midline of the forehead without associated radiopaque foreign body, displaced calvarial fracture or acute intracranial process. 2. Advanced atrophy and mild microvascular ischemic disease. Cervical Spine CT Impression: 1. No fracture or static subluxation of the cervical spine. 2. Moderate multilevel cervical spine DDD, worse at C5-C6 and C6-C7. 3. Post presumed resection of the right C7 lamina. Electronically Signed   By: Sandi Mariscal M.D.   On: 10/26/2015 16:17    Procedures Procedures (including critical care time)  Medications Ordered in ED Medications  sodium chloride 0.9 % bolus 1,000 mL (1,000 mLs Intravenous New Bag/Given 10/26/15 1803)     Initial Impression / Assessment and Plan / ED Course  I have reviewed the triage vital signs and the nursing notes.  Pertinent labs & imaging results that were available during my care of the patient were reviewed by me and considered in my medical decision making (see chart for details).  Clinical Course   [name] is a 82 y.o. female here with  syncope 2 days ago, poor appetite. Will check labs, CT head/neck. R leg ulcer chronic and has been managed with wound care doctor. Will hydrate and reassess.   6:54 PM CT showed no fractures, just subcutaneous hematoma. Cr 2.1, acute (baseline 0.8). Given IVF. Will admit for syncope, acute renal failure.     Final Clinical Impressions(s) / ED Diagnoses   Final diagnoses:  None    New Prescriptions New Prescriptions   No medications on file     Drenda Freeze, MD 10/26/15 [phone]

## 2015-10-26 NOTE — Progress Notes (Signed)
See I heal note 

## 2015-10-26 NOTE — Progress Notes (Signed)
[name], [name] ([id number]) Visit Report for 10/19/2015 Arrival Information Details Patient Name: [name], [name] Date of Service: 10/19/2015 10:45 AM Medical Record Number: [id number] Patient Account Number: [id number] Date of Birth/Sex: 1931/10/11 (82 y.o. Female) Treating RN: Carolyne Fiscal, Debi Primary Care Physician: Cranford Mon, Delfino Lovett Other Clinician: Referring Physician: Cranford Mon, Delfino Lovett Treating Physician/Extender: Frann Rider in Treatment: 3 Visit Information History Since Last Visit All ordered tests and consults were completed: No Patient Arrived: [name] Added or deleted any medications: No Arrival Time: 10:31 Any new allergies or adverse reactions: No Accompanied By: friend Had a fall or experienced change in No Transfer Assistance: None activities of daily living that may affect Patient Identification Verified: Yes risk of falls: Secondary Verification Process Yes Signs or symptoms of abuse/neglect since last No Completed: visito Patient Requires Transmission- No Hospitalized since last visit: No Based Precautions: Pain Present Now: Yes Patient Has Alerts: Yes Patient Alerts: Patient on Blood Thinner aspirin BMI 1.04 to rt lower leg Electronic Signature(s) Signed: 10/25/2015 4:58:27 PM By: Alric Quan Entered By: Alric Quan on 10/19/2015 10:33:14 [name] ([id number]) -------------------------------------------------------------------------------- Encounter Discharge Information Details Patient Name: [name] Date of Service: 10/19/2015 10:45 AM Medical Record Number: [id number] Patient Account Number: [id number] Date of Birth/Sex: Mar 09, 1932 (82 y.o. Female) Treating RN: Carolyne Fiscal, Debi Primary Care Physician: Cranford Mon, Delfino Lovett Other Clinician: Referring Physician: Cranford Mon, RICHARD Treating Physician/Extender: Frann Rider in Treatment: 3 Encounter Discharge Information Items Discharge Pain Level:  0 Discharge Condition: Stable Ambulatory Status: Cane Discharge Destination: Home Transportation: Private Auto Accompanied By: friend Schedule Follow-up Appointment: Yes Medication Reconciliation completed Yes and provided to Patient/Care Dewie Ahart: Provided on Clinical Summary of Care: 10/19/2015 Form Type Recipient Paper Patient [place] Signature(s) Signed: 10/19/2015 11:12:54 AM By: Ruthine Dose Entered By: Ruthine Dose on 10/19/2015 11:12:54 [name] ([id number]) -------------------------------------------------------------------------------- Lower Extremity Assessment Details Patient Name: [name] Date of Service: 10/19/2015 10:45 AM Medical Record Number: [id number] Patient Account Number: [id number] Date of Birth/Sex: Sep 25, 1931 (82 y.o. Female) Treating RN: Carolyne Fiscal, Debi Primary Care Physician: Wilhemena Durie Other Clinician: Referring Physician: Cranford Mon, RICHARD Treating Physician/Extender: Frann Rider in Treatment: 3 Vascular Assessment Pulses: Posterior Tibial Dorsalis Pedis Palpable: [Right:Yes] Extremity colors, hair growth, and conditions: Extremity Color: [Right:Normal] Temperature of Extremity: [Right:Warm] Capillary Refill: [Right:< 3 seconds] Toe Nail Assessment Left: Right: Thick: No Discolored: No Deformed: No Improper Length and Hygiene: No Electronic Signature(s) Signed: 10/25/2015 4:58:27 PM By: Alric Quan Entered By: Alric Quan on 10/19/2015 10:35:46 [name], [name] ([id number]) -------------------------------------------------------------------------------- Multi Wound Chart Details Patient Name: [name] Date of Service: 10/19/2015 10:45 AM Medical Record Number: [id number] Patient Account Number: [id number] Date of Birth/Sex: 12-Nov-1931 (82 y.o. Female) Treating RN: Carolyne Fiscal, Debi Primary Care Physician: Wilhemena Durie Other Clinician: Referring Physician: Cranford Mon, RICHARD Treating Physician/Extender: Frann Rider in Treatment: 3 Vital Signs Height(in): 61 Pulse(bpm): 78 Weight(lbs): 170 Blood Pressure 119/63 (mmHg): Body Mass Index(BMI): 32 Temperature(F): Respiratory Rate 20 (breaths/min): Photos: [3:No Photos] [N/A:N/A] Wound Location: [3:Right Lower Leg - Anterior N/A] Wounding Event: [3:Trauma] [N/A:N/A] Primary Etiology: [3:Trauma, Other] [N/A:N/A] Comorbid History: [3:Cataracts, Hypertension, N/A Colitis, Type II Diabetes, Osteoarthritis] Date Acquired: [3:09/15/2015] [N/A:N/A] Weeks of Treatment: [3:3] [N/A:N/A] Wound Status: [3:Open] [N/A:N/A] Measurements L x W x D 6x5.8x0.5 [N/A:N/A] (cm) Area (cm) : [3:27.332] [N/A:N/A] Volume (cm) : [3:13.666] [N/A:N/A] % Reduction in Area: [3:51.70%] [N/A:N/A] % Reduction in Volume: -141.70% [N/A:N/A] Classification: [3:Full Thickness Without Exposed Support Structures] [N/A:N/A] HBO Classification: [3:Grade 1] [N/A:N/A]  Exudate Amount: [3:Large] [N/A:N/A] Exudate Type: [3:Serosanguineous] [N/A:N/A] Exudate Color: [3:red, brown] [N/A:N/A] Foul Odor After [3:Yes] [N/A:N/A] Cleansing: Odor Anticipated Due to No [N/A:N/A] Product Use: Wound Margin: [3:Distinct, outline attached N/A] Granulation Amount: [3:Small (1-33%)] [N/A:N/A] Granulation Quality: [3:Red, Pink] [N/A:N/A] Necrotic Amount: Large (67-100%) N/A N/A Necrotic Tissue: Eschar, Adherent Slough N/A N/A Exposed Structures: Fascia: No N/A N/A Fat: No Tendon: No Muscle: No Joint: No Bone: No Limited to Skin Breakdown Epithelialization: None N/A N/A Periwound Skin Texture: Edema: Yes N/A N/A Excoriation: No Induration: No Callus: No Crepitus: No Fluctuance: No Friable: No Rash: No Scarring: No Periwound Skin Moist: Yes N/A N/A Moisture: Maceration: No Dry/Scaly: No Periwound Skin Color: No Abnormalities Noted N/A N/A Temperature: No Abnormality N/A N/A Tenderness on Yes N/A N/A Palpation: Wound  Preparation: Ulcer Cleansing: N/A N/A Rinsed/Irrigated with Saline Topical Anesthetic Applied: Other: lidocaine 4% Treatment Notes Electronic Signature(s) Signed: 10/25/2015 4:58:27 PM By: Alric Quan Entered By: Alric Quan on 10/19/2015 10:42:46 [name], [name] ([id number]) -------------------------------------------------------------------------------- Multi-Disciplinary Care Plan Details Patient Name: [name] Date of Service: 10/19/2015 10:45 AM Medical Record Number: [id number] Patient Account Number: [id number] Date of Birth/Sex: 1931/04/12 (82 y.o. Female) Treating RN: Carolyne Fiscal, Debi Primary Care Physician: Cranford Mon, Delfino Lovett Other Clinician: Referring Physician: Wilhemena Durie Treating Physician/Extender: Frann Rider in Treatment: 3 Active Inactive Abuse / Safety / Falls / Self Care Management Nursing Diagnoses: Potential for falls Goals: Patient/caregiver will verbalize understanding of skin care regimen Date Initiated: 09/28/2015 Goal Status: Active Patient/caregiver will verbalize/demonstrate measures taken to prevent injury and/or falls Date Initiated: 09/28/2015 Goal Status: Active Interventions: Assess fall risk on admission and as needed Assess: immobility, friction, shearing, incontinence upon admission and as needed Assess impairment of mobility on admission and as needed per policy Assess self care needs on admission and as needed Provide education on fall prevention Notes: Orientation to the Wound Care Program Nursing Diagnoses: Knowledge deficit related to the wound healing center program Goals: Patient/caregiver will verbalize understanding of the [facility] Date Initiated: 09/28/2015 Goal Status: Active Interventions: Provide education on orientation to the wound center Notes: [name], [name] ([id number]) Pain, Acute or Chronic Nursing Diagnoses: Potential alteration in comfort,  pain Goals: Patient will verbalize adequate pain control and receive pain control interventions during procedures as needed Date Initiated: 09/28/2015 Goal Status: Active Patient/caregiver will verbalize adequate pain control between visits Date Initiated: 09/28/2015 Goal Status: Active Patient/caregiver will verbalize comfort level met Date Initiated: 09/28/2015 Goal Status: Active Interventions: Assess comfort goal upon admission Complete pain assessment as per visit requirements Encourage patient to take pain medications as prescribed Notes: Wound/Skin Impairment Nursing Diagnoses: Impaired tissue integrity Goals: Ulcer/skin breakdown will heal within 14 weeks Date Initiated: 09/28/2015 Goal Status: Active Interventions: Assess patient/caregiver ability to obtain necessary supplies Assess patient/caregiver ability to perform ulcer/skin care regimen upon admission and as needed Provide education on ulcer and skin care Notes: Electronic Signature(s) Signed: 10/25/2015 4:58:27 PM By: Alric Quan Entered By: Alric Quan on 10/19/2015 10:42:41 [name], [name] ([id number]) -------------------------------------------------------------------------------- Pain Assessment Details Patient Name: [name] Date of Service: 10/19/2015 10:45 AM Medical Record Number: [id number] Patient Account Number: [id number] Date of Birth/Sex: 08/13/1931 (82 y.o. Female) Treating RN: Carolyne Fiscal, Debi Primary Care Physician: Wilhemena Durie Other Clinician: Referring Physician: Wilhemena Durie Treating Physician/Extender: Frann Rider in Treatment: 3 Active Problems Location of Pain Severity and Description of Pain Patient Has Paino Yes Site Locations Pain Location: Pain in Ulcers With Dressing Change: Yes Duration  of the Pain. Constant / Intermittento Constant Rate the pain. Current Pain Level: 3 Worst Pain Level: 10 Least Pain Level: 2 Character of  Pain Describe the Pain: Aching, Burning, Tender, Throbbing Pain Management and Medication Current Pain Management: Electronic Signature(s) Signed: 10/25/2015 4:58:27 PM By: Alric Quan Entered By: Alric Quan on 10/19/2015 10:33:37 [name] ([id number]) -------------------------------------------------------------------------------- Patient/Caregiver Education Details Patient Name: [name] Date of Service: 10/19/2015 10:45 AM Medical Record Number: [id number] Patient Account Number: [id number] Date of Birth/Gender: 1931/09/11 (82 y.o. Female) Treating RN: Carolyne Fiscal, Debi Primary Care Physician: Cranford Mon, Delfino Lovett Other Clinician: Referring Physician: Cranford Mon, RICHARD Treating Physician/Extender: Frann Rider in Treatment: 3 Education Assessment Education Provided To: Patient Education Topics Provided Wound/Skin Impairment: Handouts: Other: change dressing as ordered Methods: Demonstration, Explain/Verbal Responses: State content correctly Electronic Signature(s) Signed: 10/25/2015 4:58:27 PM By: Alric Quan Entered By: Alric Quan on 10/19/2015 10:56:19 [name], [name] ([id number]) -------------------------------------------------------------------------------- Wound Assessment Details Patient Name: [name] Date of Service: 10/19/2015 10:45 AM Medical Record Number: [id number] Patient Account Number: [id number] Date of Birth/Sex: March 06, 1932 (82 y.o. Female) Treating RN: Carolyne Fiscal, Debi Primary Care Physician: Cranford Mon, Delfino Lovett Other Clinician: Referring Physician: Cranford Mon, RICHARD Treating Physician/Extender: Frann Rider in Treatment: 3 Wound Status Wound Number: 3 Primary Trauma, Other Etiology: Wound Location: Right Lower Leg - Anterior Wound Open Wounding Event: Trauma Status: Date Acquired: 09/15/2015 Comorbid Cataracts, Hypertension, Colitis, Type Weeks Of Treatment: 3 History: II Diabetes,  Osteoarthritis Clustered Wound: No Photos Photo Uploaded By: Alric Quan on 10/19/2015 11:17:09 Wound Measurements Length: (cm) 6 Width: (cm) 5.8 Depth: (cm) 0.5 Area: (cm) 27.332 Volume: (cm) 13.666 % Reduction in Area: 51.7% % Reduction in Volume: -141.7% Epithelialization: None Tunneling: No Undermining: No Wound Description Full Thickness Without Exposed Foul Odor Af Classification: Support Structures Due to Produ Diabetic Severity Grade 1 (Wagner): Wound Margin: Distinct, outline attached Exudate Amount: Large Exudate Type: Serosanguineous Exudate Color: red, brown ter Cleansing: Yes ct Use: No Wound Bed Granulation Amount: Small (1-33%) Exposed Structure Granulation Quality: Red, Pink Fascia Exposed: No [name], [name] ([id number]) Necrotic Amount: Large (67-100%) Fat Layer Exposed: No Necrotic Quality: Eschar, Adherent Slough Tendon Exposed: No Muscle Exposed: No Joint Exposed: No Bone Exposed: No Limited to Skin Breakdown Periwound Skin Texture Texture Color No Abnormalities Noted: No No Abnormalities Noted: Yes Callus: No Temperature / Pain Crepitus: No Temperature: No Abnormality Excoriation: No Tenderness on Palpation: Yes Fluctuance: No Friable: No Induration: No Localized Edema: Yes Rash: No Scarring: No Moisture No Abnormalities Noted: No Dry / Scaly: No Maceration: No Moist: Yes Wound Preparation Ulcer Cleansing: Rinsed/Irrigated with Saline Topical Anesthetic Applied: Other: lidocaine 4%, Treatment Notes Wound #3 (Right, Anterior Lower Leg) 1. Cleansed with: Clean wound with Normal Saline 2. Anesthetic Topical Lidocaine 4% cream to wound bed prior to debridement 4. Dressing Applied: Santyl Ointment 5. Secondary Dressing Applied Non-Adherent pad ABD and Kerlix/Conform 7. Secured with Tape Notes netting Electronic Signature(s) [name], [name] ([id number]) Signed: 10/25/2015 4:58:27 PM By: Alric Quan Entered By: Alric Quan on 10/19/2015 10:42:30 [name], [name] ([id number]) -------------------------------------------------------------------------------- Vitals Details Patient Name: [name] Date of Service: 10/19/2015 10:45 AM Medical Record Number: [id number] Patient Account Number: [id number] Date of Birth/Sex: [birth year] (82 y.o. Female) Treating RN: Carolyne Fiscal, Debi Primary Care Physician: Wilhemena Durie Other Clinician: Referring Physician: Cranford Mon, Delfino Lovett Treating Physician/Extender: Frann Rider in Treatment: 3 Vital Signs Time Taken: 10:33 Pulse (bpm): 78 Height (in): 61 Respiratory Rate (breaths/min): 20 Weight (lbs): 170  Blood Pressure (mmHg): 119/63 Body Mass Index (BMI): 32.1 Reference Range: 80 - 120 mg / dl Electronic Signature(s) Signed: 10/25/2015 4:58:27 PM By: Alric Quan Entered By: Alric Quan on 10/19/2015 10:35:31

## 2015-10-26 NOTE — ED Notes (Signed)
Report called to marsha rn on 2c.

## 2015-10-26 NOTE — ED Notes (Signed)
Pt transported to Room 208

## 2015-10-26 NOTE — H&P (Addendum)
[facility] NAME: [name]    MR#:  [id number]  DATE OF BIRTH:  30-Aug-1931  DATE OF ADMISSION:  10/26/2015  PRIMARY CARE PHYSICIAN: [name], PA   REQUESTING/REFERRING PHYSICIAN: Drenda Freeze, MD  CHIEF COMPLAINT:   Chief Complaint  Patient presents with  . Loss of Consciousness   Syncope and poor oral intake. HISTORY OF PRESENT ILLNESS:  [name]  is a 82 y.o. female with a known history of Hypertension, diabetes and diverticulitis. The patient has had poor oral intake and generalized weakness for the past one month. She passed out for a few minutes today, but she denies any seizure, incontinence or dizziness. She has some frontal headaches. She was found to have renal failure.  PAST MEDICAL HISTORY:   Past Medical History:  Diagnosis Date  . Diabetes mellitus without complication ([place])   . Diverticulitis   . Hypertension     PAST SURGICAL HISTORY:   Past Surgical History:  Procedure Laterality Date  . ABDOMINAL HYSTERECTOMY    . BACK SURGERY  08/08/2008   Lumbar discectomy  . NECK SURGERY  1980    SOCIAL HISTORY:   Social History  Substance Use Topics  . Smoking status: Former Smoker    Quit date: 03/09/1978  . Smokeless tobacco: Never Used  . Alcohol use Yes     Comment: occasional    FAMILY HISTORY:   Family History  Problem Relation Age of Onset  . Hypertension Mother   . Diabetes Mother   . Lung cancer Father     DRUG ALLERGIES:   Allergies  Allergen Reactions  . Morphine Sulfate Other (See Comments)    BP bottoms out  . Penicillins Diarrhea    Has patient had a PCN reaction causing immediate rash, facial/tongue/throat swelling, SOB or lightheadedness with hypotension: Yes Has patient had a PCN reaction causing severe rash involving mucus membranes or skin necrosis: Yes Has patient had a PCN reaction that required hospitalization No Has patient had a PCN reaction  occurring within the last 10 years: No If all of the above answers are "NO", then may proceed with Cephalosporin use.    REVIEW OF SYSTEMS:   Review of Systems  Constitutional: Positive for malaise/fatigue and weight loss. Negative for chills and fever.  HENT: Negative for congestion and sore throat.   Eyes: Negative for blurred vision and double vision.  Respiratory: Negative for cough, hemoptysis, shortness of breath, wheezing and stridor.   Cardiovascular: Negative for chest pain, palpitations, orthopnea and leg swelling.  Gastrointestinal: Negative for abdominal pain, blood in stool, diarrhea, melena, nausea and vomiting.  Genitourinary: Negative for dysuria and urgency.  Musculoskeletal: Negative for joint pain.  Skin: Negative for rash.  Neurological: Positive for loss of consciousness and weakness. Negative for dizziness, tingling, focal weakness and headaches.  Psychiatric/Behavioral: Negative for depression.    MEDICATIONS AT HOME:   Prior to Admission medications   Medication Sig Start Date End Date Taking? Authorizing Provider  Ascorbic Acid (VITAMIN C PO) Take by mouth.   Yes Historical Provider, MD  aspirin 81 MG tablet Take by mouth. 01/02/10  Yes Historical Provider, MD  budesonide (ENTOCORT EC) 3 MG 24 hr capsule TAKE 3  CAPSULES  ORAL, DAILY 07/25/15  Yes Lucilla Lame, MD  Cholecalciferol (VITAMIN D) 2000 UNITS CAPS Take by mouth.   Yes Historical Provider, MD  furosemide (LASIX) 20 MG tablet TAKE ONE TABLET BY MOUTH DAILY 10/18/15  Yes Vickki Muff Chrismon, PA  glipiZIDE (GLUCOTROL XL) 2.5 MG 24 hr tablet Take 1 tablet (2.5 mg total) by mouth daily with breakfast. 08/15/15  Yes Margarita Rana, MD  lisinopril-hydrochlorothiazide (PRINZIDE,ZESTORETIC) 10-12.5 MG tablet 1 (ONE) TABLET, ORAL, DAILY 08/31/15  Yes Margarita Rana, MD  metFORMIN (GLUCOPHAGE) 500 MG tablet Take 1 tablet (500 mg total) by mouth 2 (two) times daily with a meal. 08/15/15  Yes Margarita Rana, MD  Multiple  Vitamin tablet Take by mouth. Reported on 03/13/2015   Yes Historical Provider, MD  Multiple Vitamins-Minerals (ZINC PO) Take by mouth.   Yes Historical Provider, MD  OMEGA-3 FATTY ACIDS PO Take by mouth.   Yes Historical Provider, MD  simvastatin (ZOCOR) 20 MG tablet TAKE 1 TABLET BY MOUTH AT BEDTIME 05/14/15  Yes Margarita Rana, MD  Blood Glucose Monitoring Suppl (ACCU-CHEK AVIVA CONNECT) W/DEVICE KIT ACCU-CHEK AVIVA (In Vitro Strip)  1 (one) Strip Strip daily for 0 days  Quantity: 100;  Refills: 3   Ordered :24-Oct-2013  Lynford Humphrey ;  Started 24-Oct-2013 Active Comments: Dx: 250.00 also include Accu-chek aviva meter 10/24/13   Historical Provider, MD  glucose blood test strip [facility] CONTOUR TEST (In Vitro Strip)  1 (one) Strip Strip to check blood sugar daily for 0 days  Quantity: 50;  Refills: 5   Ordered :20-May-2013  Gerald Leitz ;  Started 20-May-2013 Active Comments: & Lancets 05/20/13   Historical Provider, MD  ibuprofen (ADVIL,MOTRIN) 200 MG tablet Take 600 mg by mouth 3 (three) times daily.    Historical Provider, MD  traMADol (ULTRAM) 50 MG tablet Take 1 tablet (50 mg total) by mouth every 8 (eight) hours as needed. 10/23/15   Vickki Muff Chrismon, PA      VITAL SIGNS:  Blood pressure (!) 121/99, pulse 70, temperature 98.2 F (36.8 C), temperature source Oral, resp. rate 20, height '5\' 1"'$  (1.549 m), weight 166 lb 9.6 oz (75.6 kg), SpO2 98 %.  PHYSICAL EXAMINATION:  Physical Exam  GENERAL:  82 y.o.-year-old patient lying in the bed with no acute distress.  EYES: Pupils equal, round, reactive to light and accommodation. No scleral icterus. Extraocular muscles intact. Ecchymosis on right orbital area. HEENT: Head atraumatic, normocephalic. Oropharynx and nasopharynx clear. Very dry oral mucosa. NECK:  Supple, no jugular venous distention. No thyroid enlargement, no tenderness.  LUNGS: Normal breath sounds bilaterally, no wheezing, rales,rhonchi or crepitation. No use of accessory muscles of  respiration.  CARDIOVASCULAR: S1, S2 normal. No murmurs, rubs, or gallops.  ABDOMEN: Soft, nontender, nondistended. Bowel sounds present. No organomegaly or mass.  EXTREMITIES: No pedal edema, cyanosis, or clubbing.  NEUROLOGIC: Cranial nerves II through XII are intact. Muscle strength 4/5 in all extremities. Sensation intact. Gait not checked.  PSYCHIATRIC: The patient is alert and oriented x 3.  SKIN: No obvious rash, lesion, or ulcer.   LABORATORY PANEL:   CBC  Recent Labs Lab 10/26/15 1423  WBC 12.2*  HGB 14.3  HCT 41.4  PLT 333   ------------------------------------------------------------------------------------------------------------------  Chemistries   Recent Labs Lab 10/26/15 1423  NA 137  K 4.3  CL 103  CO2 27  GLUCOSE 181*  BUN 99*  CREATININE 2.16*  CALCIUM 9.6   ------------------------------------------------------------------------------------------------------------------  Cardiac Enzymes  Recent Labs Lab 10/26/15 1750  TROPONINI <0.03   ------------------------------------------------------------------------------------------------------------------  RADIOLOGY:  Dg Chest 2 View  Result Date: 10/26/2015 CLINICAL DATA:  Fall when state.  Weakness and fatigue. EXAM: CHEST  2 VIEW COMPARISON:  Two-view chest x-ray 05/14/2015 FINDINGS: Heart size  normal. Chronic elevation left hemidiaphragm is stable. There is no edema or effusion to suggest failure. Degenerative changes of the thoracic spine are stable. IMPRESSION: No acute cardiopulmonary disease or significant interval change. Electronically Signed   By: San Morelle M.D.   On: 10/26/2015 18:59   Ct Head Wo Contrast  Result Date: 10/26/2015 CLINICAL DATA:  Syncopal episode now with bruising to the nose, orbits and forehead. EXAM: CT HEAD WITHOUT CONTRAST CT CERVICAL SPINE WITHOUT CONTRAST TECHNIQUE: Multidetector CT imaging of the head and cervical spine was performed following the  standard protocol without intravenous contrast. Multiplanar CT image reconstructions of the cervical spine were also generated. COMPARISON:  None. FINDINGS: CT HEAD FINDINGS There is a minimal amount of soft tissue stranding about the midline of the forehead (image 20, series 2, sagittal image 13, series 606). This finding is without associated radiopaque foreign body or displaced calvarial fracture. Normal noncontrast appearance of the bilateral orbits and globes. Post right-sided cataract surgery. No retrobulbar hematoma. Limited visualization of the paranasal sinuses and mastoid air cells is normal. No air-fluid levels. Mild atrophy with sulcal prominence centralized volume loss with commensurate ex vacuo dilatation the ventricular system. Scattered periventricular hypodensities compatible microvascular ischemic disease. Bilateral basal ganglial calcifications. No CT evidence of acute large territory infarct. Beam hardening beam hardening artifact involving the bilateral frontal lobe cortices, right greater than left. No definite intraparenchymal or extra-axial hemorrhage. No definite intraparenchymal or extra-axial mass on this noncontrast examination. Normal configuration of the ventricles and basilar cisterns. No midline shift. There is diffuse increased attenuation intracranial blood pool suggestive of volume depletion. CT CERVICAL SPINE FINDINGS C1 to the superior endplate of T2 There is minimal (approximately 2 mm) of anterolisthesis of C7 upon T1. The dens is normally positioned between the lateral masses of C1. Mild degenerative change of the atlantodental articulation with partial ossification of the transverse ligament. Normal atlantoaxial articulations. Presumed resection of the C7 right lamina (representative axial image 58, series 5, sagittal image 20, series 603). The bilateral facets are normally aligned. Note is made of ankylosis involving in the left C2-C3 transverse process. No fracture or  static subluxation of the cervical spine. Cervical vertebral body heights are preserved. Prevertebral soft tissues are normal. Moderate multilevel cervical spine DDD, worse at C5-C6 and C6-C7 with disc space height loss, endplate irregularity and sclerosis. Regional soft tissues appear normal. No bulky cervical lymphadenopathy. Normal appearance of the thyroid gland. Limited visualization of the lung apices is normal. IMPRESSION: Head CT Impression: 1. Minimal soft tissue swelling about the midline of the forehead without associated radiopaque foreign body, displaced calvarial fracture or acute intracranial process. 2. Advanced atrophy and mild microvascular ischemic disease. Cervical Spine CT Impression: 1. No fracture or static subluxation of the cervical spine. 2. Moderate multilevel cervical spine DDD, worse at C5-C6 and C6-C7. 3. Post presumed resection of the right C7 lamina. Electronically Signed   By: Sandi Mariscal M.D.   On: 10/26/2015 16:17   Ct Cervical Spine Wo Contrast  Result Date: 10/26/2015 CLINICAL DATA:  Syncopal episode now with bruising to the nose, orbits and forehead. EXAM: CT HEAD WITHOUT CONTRAST CT CERVICAL SPINE WITHOUT CONTRAST TECHNIQUE: Multidetector CT imaging of the head and cervical spine was performed following the standard protocol without intravenous contrast. Multiplanar CT image reconstructions of the cervical spine were also generated. COMPARISON:  None. FINDINGS: CT HEAD FINDINGS There is a minimal amount of soft tissue stranding about the midline of the forehead (image 20, series 2, sagittal  image 13, series 606). This finding is without associated radiopaque foreign body or displaced calvarial fracture. Normal noncontrast appearance of the bilateral orbits and globes. Post right-sided cataract surgery. No retrobulbar hematoma. Limited visualization of the paranasal sinuses and mastoid air cells is normal. No air-fluid levels. Mild atrophy with sulcal prominence centralized  volume loss with commensurate ex vacuo dilatation the ventricular system. Scattered periventricular hypodensities compatible microvascular ischemic disease. Bilateral basal ganglial calcifications. No CT evidence of acute large territory infarct. Beam hardening beam hardening artifact involving the bilateral frontal lobe cortices, right greater than left. No definite intraparenchymal or extra-axial hemorrhage. No definite intraparenchymal or extra-axial mass on this noncontrast examination. Normal configuration of the ventricles and basilar cisterns. No midline shift. There is diffuse increased attenuation intracranial blood pool suggestive of volume depletion. CT CERVICAL SPINE FINDINGS C1 to the superior endplate of T2 There is minimal (approximately 2 mm) of anterolisthesis of C7 upon T1. The dens is normally positioned between the lateral masses of C1. Mild degenerative change of the atlantodental articulation with partial ossification of the transverse ligament. Normal atlantoaxial articulations. Presumed resection of the C7 right lamina (representative axial image 58, series 5, sagittal image 20, series 603). The bilateral facets are normally aligned. Note is made of ankylosis involving in the left C2-C3 transverse process. No fracture or static subluxation of the cervical spine. Cervical vertebral body heights are preserved. Prevertebral soft tissues are normal. Moderate multilevel cervical spine DDD, worse at C5-C6 and C6-C7 with disc space height loss, endplate irregularity and sclerosis. Regional soft tissues appear normal. No bulky cervical lymphadenopathy. Normal appearance of the thyroid gland. Limited visualization of the lung apices is normal. IMPRESSION: Head CT Impression: 1. Minimal soft tissue swelling about the midline of the forehead without associated radiopaque foreign body, displaced calvarial fracture or acute intracranial process. 2. Advanced atrophy and mild microvascular ischemic disease.  Cervical Spine CT Impression: 1. No fracture or static subluxation of the cervical spine. 2. Moderate multilevel cervical spine DDD, worse at C5-C6 and C6-C7. 3. Post presumed resection of the right C7 lamina. Electronically Signed   By: Sandi Mariscal M.D.   On: 10/26/2015 16:17      IMPRESSION AND PLAN:   Acute renal failure The patient will be admitted to medical floor. I will hold lisinopril-HCTZ and ibuprofen, continue IV fluid support and follow-up BMP.  Syncope, possible due to ARF.  UTI. Start rocephin, follow-up urine culture.  Hypertension. Hold hypertension medication due to low side blood pressure.  Diabetes. Hold by mouth glyburide lead, start a sliding scale.  All the records are reviewed and case discussed with ED provider. Management plans discussed with the patient, her daughter and they are in agreement.  CODE STATUS: full code.  TOTAL TIME TAKING CARE OF THIS PATIENT: 56 minutes.    Demetrios Loll M.D on 10/26/2015 at 9:26 PM  Between 7am to 6pm - Pager - [phone]  After 6pm go to [email or web address] - Proofreader  [facility]  Office  [phone]  CC: Primary care physician; [name], PA   Note: This dictation was prepared with Dragon dictation along with smaller phrase technology. Any transcriptional errors that result from this process are unintentional.

## 2015-10-26 NOTE — Progress Notes (Signed)
NASHALI, FIX ([id number]) Visit Report for 10/19/2015 Chief Complaint Document Details Patient Name: [name], [name] 10/19/2015 10:45 Date of Service: AM Medical Record [id number] Number: Patient Account Number: [id number] 05-27-1931 (82 y.o. Treating RN: Ahmed Prima Date of Birth/Sex: Female) Other Clinician: Primary Care Physician: Cranford Mon, RICHARD Treating Christin Fudge Referring Physician: Wilhemena Durie Physician/Extender: Suella Grove in Treatment: 3 Information Obtained from: Patient Chief Complaint Patient presents to the wound care center for a consult due non healing wound to the right lower extremity which she's had for about 3 weeks now Electronic Signature(s) Signed: 10/19/2015 11:31:34 AM By: Christin Fudge MD, FACS Entered By: Christin Fudge on 10/19/2015 11:31:32 [name] ([id number]) -------------------------------------------------------------------------------- Debridement Details Patient Name: [name] Austria R. 10/19/2015 10:45 Date of Service: AM Medical Record [id number] Number: Patient Account Number: [id number] December 08, 1931 (82 y.o. Treating RN: Ahmed Prima Date of Birth/Sex: Female) Other Clinician: Primary Care Physician: Cranford Mon, RICHARD Treating Elif Yonts Referring Physician: Wilhemena Durie Physician/Extender: Suella Grove in Treatment: 3 Debridement Performed for Wound #3 Right,Anterior Lower Leg Assessment: Performed By: Physician Christin Fudge, MD Debridement: Debridement Pre-procedure Yes Verification/Time Out Taken: Start Time: 11:00 Pain Control: Lidocaine 4% Topical Solution Level: Skin/Subcutaneous Tissue Total Area Debrided (L x 6 (cm) x 5 (cm) = 30 (cm) W): Tissue and other Viable, Non-Viable, Eschar, Fibrin/Slough, Subcutaneous material debrided: Instrument: Curette Bleeding: Minimum Hemostasis Achieved: Pressure End Time: 11:04 Procedural Pain: 0 Post Procedural Pain: 0 Response to Treatment:  Procedure was tolerated well Post Debridement Measurements of Total Wound Length: (cm) 6 Width: (cm) 5.8 Depth: (cm) 0.5 Volume: (cm) 13.666 Post Procedure Diagnosis Same as Pre-procedure Electronic Signature(s) Signed: 10/19/2015 11:31:24 AM By: Christin Fudge MD, FACS Signed: 10/25/2015 4:58:27 PM By: Alric Quan Entered By: Christin Fudge on 10/19/2015 11:31:24 [name], [name] ([id number]) -------------------------------------------------------------------------------- HPI Details Patient Name: [name] 10/19/2015 10:45 Date of Service: AM Medical Record [id number] Number: Patient Account Number: [id number] 1932/01/16 (82 y.o. Treating RN: Ahmed Prima Date of Birth/Sex: Female) Other Clinician: Primary Care Physician: Cranford Mon, RICHARD Treating Christin Fudge Referring Physician: Wilhemena Durie Physician/Extender: Suella Grove in Treatment: 3 History of Present Illness Location: lacerated wound to the right lower extremity Quality: Patient reports experiencing a dull pain to affected area(s). Severity: Patient states wound (s) are getting better. Duration: Patient has had the wound for < 3 weeks prior to presenting for treatment Timing: Pain in wound is Intermittent (comes and goes Context: The wound occurred when the patient had a blunt injury which caused a laceration to the right lower extremity Modifying Factors: Other treatment(s) tried include:was seen in the ER on 09/15/2015 and sutured with 4-0 nylon sutures. Associated Signs and Symptoms: Patient reports having increase swelling. HPI Description: 82 year old patient who had an injury to her right lower extremity on July 8 while she was in [place] boarding a plane. She was treated at [facility] with sutures being placed on 09/15/2015. She was recently seen last week by her PCP edema of the feet and legs and after review as advised to take Lasix 20 mg daily. past hemoglobin A1c done in June 2017 was  9.1% Her past medical history is significant for diabetes mellitus, arthritis, basal cell carcinoma, collagenous colitis, essential hypertension, avitaminosis D. She is also status post abdominal hysterectomy, lumbar discectomy, neck surgery. She is a former smoker and quit in 1979. Of note in the recent past she was seen by Dr. Lucky Cowboy for what seems a sounds like injections of varicose veins. This was only around her  ankles and not in the lower extremities. No Doppler studies were done. She was not advised to wear any compression stockings. Electronic Signature(s) Signed: 10/19/2015 11:31:40 AM By: Christin Fudge MD, FACS Entered By: Christin Fudge on 10/19/2015 11:31:40 [name], [name] ([id number]) -------------------------------------------------------------------------------- Physical Exam Details Patient Name: [name], [name] 10/19/2015 10:45 Date of Service: AM Medical Record [id number] Number: Patient Account Number: [id number] [birth year] (83 y.o. Treating RN: Ahmed Prima Date of Birth/Sex: Female) Other Clinician: Primary Care Physician: Cranford Mon, RICHARD Treating Christin Fudge Referring Physician: Cranford Mon, Delfino Lovett Physician/Extender: Suella Grove in Treatment: 3 Constitutional . Pulse regular. Respirations normal and unlabored. Afebrile. . Eyes Nonicteric. Reactive to light. Ears, Nose, Mouth, and Throat Lips, teeth, and gums WNL.Marland Kitchen Moist mucosa without lesions. Neck supple and nontender. No palpable supraclavicular or cervical adenopathy. Normal sized without goiter. Respiratory WNL. No retractions.. Cardiovascular Pedal Pulses WNL. No clubbing, cyanosis or edema. Lymphatic No adneopathy. No adenopathy. No adenopathy. Musculoskeletal Adexa without tenderness or enlargement.. Digits and nails w/o clubbing, cyanosis, infection, petechiae, ischemia, or inflammatory conditions.. Integumentary (Hair, Skin) No suspicious lesions. No crepitus or fluctuance. No peri-wound  warmth or erythema. No masses.Marland Kitchen Psychiatric Judgement and insight Intact.. No evidence of depression, anxiety, or agitation.. Notes the debris was sharply removed with a #3 curet and there is quite a bit of it which was washed out with moist saline and bleeding controlled with pressure. Overall the progress has been as expected. Electronic Signature(s) Signed: 10/19/2015 11:32:38 AM By: Christin Fudge MD, FACS Entered By: Christin Fudge on 10/19/2015 11:32:37 [name], [name] ([id number]) -------------------------------------------------------------------------------- Physician Orders Details Patient Name: [name], [name] 10/19/2015 10:45 Date of Service: AM Medical Record [id number] Number: Patient Account Number: [id number] [birth year] (83 y.o. Treating RN: Montey Hora Date of Birth/Sex: Female) Other Clinician: Primary Care Physician: Cranford Mon, RICHARD Treating Christin Fudge Referring Physician: Wilhemena Durie Physician/Extender: Suella Grove in Treatment: 3 Verbal / Phone Orders: Yes Clinician: Montey Hora Read Back and Verified: Yes Diagnosis Coding Wound Cleansing Wound #3 Right,Anterior Lower Leg o Clean wound with Normal Saline. o Cleanse wound with mild soap and water o May Shower, gently pat wound dry prior to applying new dressing. Anesthetic Wound #3 Right,Anterior Lower Leg o Topical Lidocaine 4% cream applied to wound bed prior to debridement - for clinic use Primary Wound Dressing Wound #3 Right,Anterior Lower Leg o Santyl Ointment Secondary Dressing Wound #3 Right,Anterior Lower Leg o ABD pad o Conform/Kerlix - conform netting and tape o Non-adherent pad Dressing Change Frequency Wound #3 Right,Anterior Lower Leg o Change dressing every day. - may change more if needed due to drainage Follow-up Appointments Wound #3 Right,Anterior Lower Leg o Return Appointment in 1 week. Additional Orders / Instructions Wound #3  Right,Anterior Lower Leg o Increase protein intake. GEORGIAN, MATHEW ([id number]) Medications-please add to medication list. Wound #3 Right,Anterior Lower Leg o Santyl Enzymatic Ointment o Other: - Vitamin A, Vitamin C, Zinc, Multivitamin Electronic Signature(s) Signed: 10/19/2015 3:26:16 PM By: Christin Fudge MD, FACS Signed: 10/19/2015 3:54:21 PM By: Montey Hora Entered By: Montey Hora on 10/19/2015 11:01:08 [name], [name] ([id number]) -------------------------------------------------------------------------------- Problem List Details Patient Name: JACQUELINNE, BRUMLOW 10/19/2015 10:45 Date of Service: AM Medical Record [id number] Number: Patient Account Number: [id number] 12-Apr-1931 (82 y.o. 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 Treating RN: Ahmed Prima Date of Birth/Sex: Female) Other Clinician: Primary  Care Physician: Cranford Mon, RICHARD Treating Christin Fudge Referring Physician: Wilhemena Durie Physician/Extender: Suella Grove in Treatment: 3 Subjective Chief Complaint Information obtained from Patient Patient presents to the wound care center for a consult due non healing wound to the right lower extremity which she's had for about 3 weeks now History of Present Illness (HPI) The following HPI elements were documented for the patient's wound: Location: lacerated wound to the right lower extremity Quality: Patient reports experiencing a dull pain to affected area(s). Severity: Patient states wound (s) are getting better. Duration: Patient has had the wound for < 3 weeks prior to presenting for treatment Timing: Pain in wound is Intermittent (comes and goes Context: The wound occurred when the patient had a blunt injury which caused a laceration to the right lower extremity Modifying Factors: Other treatment(s) tried include:was seen in the ER on 09/15/2015 and sutured with 4-0 nylon sutures. Associated Signs and Symptoms: Patient reports having increase swelling. 82 year old patient who had an injury to her right lower extremity on July 8 while she was in [place] boarding a plane. She was treated at [facility] with sutures being placed on 09/15/2015. She was recently seen last week by her PCP edema of the feet and legs and after review as advised to take Lasix 20 mg daily. past hemoglobin A1c done in June 2017 was 9.1% Her past medical history is significant for diabetes mellitus, arthritis, basal cell carcinoma, collagenous colitis, essential hypertension, avitaminosis D. She is also status post abdominal hysterectomy, lumbar discectomy, neck surgery. She is a former smoker and quit in 1979. Of note in the recent past she was seen by Dr. Lucky Cowboy for what seems a sounds like injections of varicose veins. This was only around her ankles and not in the lower extremities. No Doppler studies were  done. She was not advised to wear any compression stockings. [name], [name] ([id number]) Objective Constitutional Pulse regular. Respirations normal and unlabored. Afebrile. Vitals Time Taken: 10:33 AM, Height: 61 in, Weight: 170 lbs, BMI: 32.1, Pulse: 78 bpm, Respiratory Rate: 20 breaths/min, Blood Pressure: 119/63 mmHg. Eyes Nonicteric. Reactive to light. Ears, Nose, Mouth, and Throat Lips, teeth, and gums WNL.Marland Kitchen Moist mucosa without lesions. Neck supple and nontender. No palpable supraclavicular or cervical adenopathy. Normal sized without goiter. Respiratory WNL. No retractions.. Cardiovascular Pedal Pulses WNL. No clubbing, cyanosis or edema. Lymphatic No adneopathy. No adenopathy. No adenopathy. Musculoskeletal Adexa without tenderness or enlargement.. Digits and nails w/o clubbing, cyanosis, infection, petechiae, ischemia, or inflammatory conditions.Marland Kitchen Psychiatric Judgement and insight Intact.. No evidence of depression, anxiety, or agitation.. General Notes: the debris was sharply removed with a #3 curet and there is quite a bit of it which was washed out with moist saline and bleeding controlled with pressure. Overall the progress has been as expected. Integumentary (Hair, Skin) No suspicious lesions. No crepitus or fluctuance. No peri-wound warmth or erythema. No  masses.. Wound #3 status is Open. Original cause of wound was Trauma. The wound is located on the Right,Anterior Lower Leg. The wound measures 6cm length x 5.8cm width x 0.5cm depth; 27.332cm^2 area and 13.666cm^3 volume. The wound is limited to skin breakdown. There is no tunneling or undermining noted. There is a large amount of serosanguineous drainage noted. The wound margin is distinct with the outline attached to the wound base. There is small (1-33%) red, pink granulation within the wound bed. There is a large (67-100%) amount of necrotic tissue within the wound bed including Eschar and Adherent Slough.  The periwound skin appearance had no abnormalities noted for color. The periwound skin [name], [name] ([id number]) appearance exhibited: Localized Edema, Moist. The periwound skin appearance did not exhibit: Callus, Crepitus, Excoriation, Fluctuance, Friable, Induration, Rash, Scarring, Dry/Scaly, Maceration. Periwound temperature was noted as No Abnormality. The periwound has tenderness on palpation. 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 There was a Skin/Subcutaneous Tissue Debridement [id number]) debridement with total area of 30 sq cm performed by Christin Fudge, MD. with the following instrument(s): Curette to remove Viable and Non-Viable tissue/material including Fibrin/Slough, Eschar, and Subcutaneous after achieving pain control using Lidocaine 4% Topical Solution. A time out was conducted prior to the start of the procedure. A Minimum amount of bleeding was controlled with Pressure. The procedure was tolerated well with a pain level of 0 throughout and a pain level of 0 following the procedure. Post Debridement Measurements: 6cm length x 5.8cm width x 0.5cm depth; 13.666cm^3 volume. Post procedure Diagnosis Wound #3: Same as Pre-Procedure Plan Wound Cleansing: Wound #3 Right,Anterior Lower Leg: Clean wound with Normal Saline. Cleanse wound with mild soap and water May Shower, gently pat wound dry prior to applying new dressing. Anesthetic: Wound #3 Right,Anterior Lower Leg: [name], [name] ([id number]) Topical Lidocaine 4% cream applied to wound bed prior to debridement - for clinic use Primary  Wound Dressing: Wound #3 Right,Anterior Lower Leg: Santyl Ointment Secondary Dressing: Wound #3 Right,Anterior Lower Leg: ABD pad Conform/Kerlix - conform netting and tape Non-adherent pad Dressing Change Frequency: Wound #3 Right,Anterior Lower Leg: Change dressing every day. - may change more if needed due to drainage Follow-up Appointments: Wound #3 Right,Anterior Lower Leg: Return Appointment in 1 week. Additional Orders / Instructions: Wound #3 Right,Anterior Lower Leg: Increase protein intake. Medications-please add to medication list.: Wound #3 Right,Anterior Lower Leg: Santyl Enzymatic Ointment Other: - Vitamin A, Vitamin C, Zinc, Multivitamin I have recommended: 1. Washing the wound daily with soap and water and applying Santyl ointment and a gauze and Kerlix dressing over this. 2. Elevation of the limbs as much as possible. 3. some stage when the wound is clean enough I may use a wound VAC for a while 4. control of her diabetes mellitus 5. Protein supplements, vitamin A, vitamin C and zinc 6. Regular visits to the wound care center. Electronic Signature(s) Signed: 10/19/2015 11:33:27 AM By: Christin Fudge MD, FACS Entered By: Christin Fudge on 10/19/2015 11:33:27 [name], HANSLER ([id number]) -------------------------------------------------------------------------------- SuperBill Details Patient Name: [name] Date of Service: 10/19/2015 Medical Record Number: [id number] Patient Account Number: [id number] Date of Birth/Sex: 1931-08-23 (82 y.o.  Female) Treating RN: Carolyne Fiscal, Debi Primary Care Physician: Cranford Mon, Delfino Lovett Other Clinician: Referring Physician: Cranford Mon, Delfino Lovett Treating Physician/Extender: Frann Rider in Treatment: 3 Diagnosis Coding ICD-10 Codes Code Description E11.622 Type 2 diabetes mellitus with other skin ulcer S81.811A Laceration without foreign body, right lower leg, initial encounter L97.212 Non-pressure chronic  ulcer of right calf with fat layer exposed I87.311 Chronic venous hypertension (idiopathic) with ulcer of right lower extremity Facility Procedures CPT4 Code Description: [id number] - DEB SUBQ TISSUE 20 SQ CM/< ICD-10 Description Diagnosis E11.622 Type 2 diabetes mellitus with other skin ulcer S81.811A Laceration without foreign body, right lower leg, initi L97.212 Non-pressure chronic ulcer of  right calf with fat layer I87.311 Chronic venous hypertension (idiopathic) with ulcer of Modifier: al encounte exposed right lower Quantity: 1 r extremity CPT4 Code Description: [id number] - DEB SUBQ TISS EA ADDL 20CM ICD-10 Description Diagnosis E11.622 Type 2 diabetes mellitus with other skin ulcer S81.811A Laceration without foreign body, right lower leg, initi L97.212 Non-pressure chronic ulcer of  right calf with fat layer I87.311 Chronic venous hypertension (idiopathic) with ulcer of Modifier: al encounte exposed right lower Quantity: 1 r extremity Physician Procedures CPT4 Code Description: [id number] - WC PHYS SUBQ TISS 20 SQ CM ICD-10 Description Diagnosis E11.622 Type 2 diabetes mellitus with other skin ulcer S81.811A Laceration without foreign body, right lower leg, initi L97.212 Non-pressure chronic ulcer of  right calf with fat layer I87.311 Chronic venous hypertension (idiopathic) with ulcer of Appling, Naiara R. ([id number]) Modifier: al encounte exposed right lower Quantity: 1 r extremity Electronic Signature(s) Signed: 10/19/2015 11:33:53 AM By: Christin Fudge MD, FACS Entered By: Christin Fudge on 10/19/2015 11:33:51

## 2015-10-26 NOTE — Progress Notes (Signed)
Pharmacist - Prescriber Communication  Ciprofloxacin adjusted to Q24H for creatinine clearance less than 30 mL/min.   Koralynn Greenspan A. [place], [place].D., BCPS Clinical Pharmacist 10/26/2015 [id number]

## 2015-10-26 NOTE — ED Notes (Signed)
Dr in with pt and family

## 2015-10-26 NOTE — ED Notes (Signed)
Pt alert and oriented  Pt has bruising to rightside of face/ey/nose.  States she passed out 2 daysago.  No headache.  Pt also has ulcer to left lower leg from running into a metal stair September 11 2015.  Treated at wound clinic.  Family with pt.  Iv started and fluids infusing.

## 2015-10-26 NOTE — ED Notes (Signed)
Pt alert.  Iv fluids infusing.  Family with pt.  Pt waiting on admission,

## 2015-10-26 NOTE — Progress Notes (Signed)
Notified MD of c/o pain RLE wound, orders taken

## 2015-10-26 NOTE — ED Triage Notes (Signed)
Pt reports Wednesday was walking to bathroom and next thing she remembers woke up on floor. Bruising to nose, bilateral periorbital area and forehead. Bruise to left arm. Denies blood thinners. Pt alert and oriented. Pt was at doctor office and recommended pt come here. Has had nausea and decreased appetite.

## 2015-10-26 NOTE — ED Notes (Signed)
Pt unable to void at this time. 

## 2015-10-27 LAB — BASIC METABOLIC PANEL
Anion gap: 6 (ref 5–15)
BUN: 85 mg/dL — ABNORMAL HIGH (ref 6–20)
CHLORIDE: 111 mmol/L (ref 101–111)
CO2: 24 mmol/L (ref 22–32)
CREATININE: 1.57 mg/dL — AB (ref 0.44–1.00)
Calcium: 8.6 mg/dL — ABNORMAL LOW (ref 8.9–10.3)
GFR calc non Af Amer: 30 mL/min — ABNORMAL LOW (ref >60)
GFR, EST AFRICAN AMERICAN: 34 mL/min — AB (ref >60)
GLUCOSE: 110 mg/dL — AB (ref 65–99)
Potassium: 3.8 mmol/L (ref 3.5–5.1)
Sodium: 141 mmol/L (ref 135–145)

## 2015-10-27 LAB — GLUCOSE, CAPILLARY
GLUCOSE-CAPILLARY: 212 mg/dL — AB (ref 65–99)
Glucose-Capillary: 167 mg/dL — ABNORMAL HIGH (ref 65–99)
Glucose-Capillary: 165 mg/dL — ABNORMAL HIGH (ref 65–99)

## 2015-10-27 LAB — CBC
HCT: 37.2 % (ref 35.0–47.0)
Hemoglobin: 13 g/dL (ref 12.0–16.0)
MCH: 31.3 pg (ref 26.0–34.0)
MCHC: 34.9 g/dL (ref 32.0–36.0)
MCV: 89.6 fL (ref 80.0–100.0)
PLATELETS: 284 10*3/uL (ref 150–440)
RBC: 4.15 MIL/uL (ref 3.80–5.20)
RDW: 13.1 % (ref 11.5–14.5)
WBC: 8.2 10*3/uL (ref 3.6–11.0)

## 2015-10-27 MED ORDER — CEPHALEXIN 500 MG PO CAPS: 500.0000 mg | ORAL_CAPSULE | Freq: Two times a day (BID) | ORAL | Status: DC

## 2015-10-27 MED ORDER — INSULIN ASPART 100 UNIT/ML ~~LOC~~ SOLN: 0.0000 [IU] | Freq: Every day | SUBCUTANEOUS | Status: DC

## 2015-10-27 MED ORDER — ENSURE ENLIVE PO LIQD
237.0000 mL | ORAL | Status: DC
  Administered 2015-10-27: 237 mL via ORAL

## 2015-10-27 MED ORDER — CIPROFLOXACIN HCL 250 MG PO TABS: 250.0000 mg | ORAL_TABLET | Freq: Two times a day (BID) | ORAL | Status: DC

## 2015-10-27 MED ORDER — CEPHALEXIN 250 MG PO CAPS
250.0000 mg | ORAL_CAPSULE | Freq: Three times a day (TID) | ORAL | Status: DC
  Administered 2015-10-27 – 2015-10-28 (×4): 250 mg via ORAL
  Filled 2015-10-27 (×4): qty 1

## 2015-10-27 MED ORDER — INSULIN ASPART 100 UNIT/ML ~~LOC~~ SOLN
0.0000 [IU] | Freq: Three times a day (TID) | SUBCUTANEOUS | Status: DC
  Administered 2015-10-27: 3 [IU] via SUBCUTANEOUS
  Administered 2015-10-28: 1 [IU] via SUBCUTANEOUS
  Filled 2015-10-27: qty 3
  Filled 2015-10-27: qty 1

## 2015-10-27 MED ORDER — GLIPIZIDE ER 2.5 MG PO TB24
2.5000 mg | ORAL_TABLET | Freq: Every day | ORAL | Status: DC
  Administered 2015-10-27 – 2015-10-28 (×2): 2.5 mg via ORAL
  Filled 2015-10-27 (×2): qty 1

## 2015-10-27 NOTE — Progress Notes (Signed)
[facility] [place] NAME: [name]    MR#:  [id number]  DATE OF BIRTH:  08-08-1931  SUBJECTIVE:  Came in after pt had a fall and bruised her forehead and has a black eye Feels better. Falls at home  Found to have dehyration  REVIEW OF SYSTEMS:   Review of Systems  Constitutional: Negative for chills, fever and weight loss.  HENT: Negative for ear discharge, ear pain and nosebleeds.   Eyes: Negative for blurred vision, pain and discharge.  Respiratory: Negative for sputum production, shortness of breath, wheezing and stridor.   Cardiovascular: Negative for chest pain, palpitations, orthopnea and PND.  Gastrointestinal: Negative for abdominal pain, diarrhea, nausea and vomiting.  Genitourinary: Negative for frequency and urgency.  Musculoskeletal: Positive for back pain and falls. Negative for joint pain.  Neurological: Positive for weakness. Negative for sensory change, speech change and focal weakness.  Psychiatric/Behavioral: Negative for depression and hallucinations. The patient is not nervous/anxious.    Tolerating Diet:yes Tolerating PT: pending  DRUG ALLERGIES:   Allergies  Allergen Reactions  . Morphine Sulfate Other (See Comments)    BP bottoms out  . Penicillins Diarrhea    Has patient had a PCN reaction causing immediate rash, facial/tongue/throat swelling, SOB or lightheadedness with hypotension: Yes Has patient had a PCN reaction causing severe rash involving mucus membranes or skin necrosis: Yes Has patient had a PCN reaction that required hospitalization No Has patient had a PCN reaction occurring within the last 10 years: No If all of the above answers are "NO", then may proceed with Cephalosporin use.    VITALS:  Blood pressure 120/71, pulse 70, temperature 97.8 F (36.6 C), temperature source Oral, resp. rate 18, height 5\' 1"  (1.549 m), weight 166 lb 9.6 oz (75.6 kg), SpO2 96 %.  PHYSICAL  EXAMINATION:   Physical Exam  GENERAL:  82 y.o.-year-old patient lying in the bed with no acute distress.  EYES: Pupils equal, round, reactive to light and accommodation. No scleral icterus. Right black eye Extraocular muscles intact.  HEENT: Head atraumatic, normocephalic. Oropharynx and nasopharynx clear. Bruised forehead with swelling NECK:  Supple, no jugular venous distention. No thyroid enlargement, no tenderness.  LUNGS: Normal breath sounds bilaterally, no wheezing, rales, rhonchi. No use of accessory muscles of respiration.  CARDIOVASCULAR: S1, S2 normal. No murmurs, rubs, or gallops.  ABDOMEN: Soft, nontender, nondistended. Bowel sounds present. No organomegaly or mass.  EXTREMITIES: No cyanosis, clubbing or edema b/l.   Right chronic tibial skin ulcer with slough. No cellulitis appears chronic NEUROLOGIC: Cranial nerves II through XII are intact. No focal Motor or sensory deficits b/l.   PSYCHIATRIC:  patient is alert and oriented x 3.  SKIN: No obvious rash, lesion, or ulcer.   LABORATORY PANEL:  CBC  Recent Labs Lab 10/27/15 0439  WBC 8.2  HGB 13.0  HCT 37.2  PLT 284    Chemistries   Recent Labs Lab 10/27/15 0439  NA 141  K 3.8  CL 111  CO2 24  GLUCOSE 110*  BUN 85*  CREATININE 1.57*  CALCIUM 8.6*   Cardiac Enzymes  Recent Labs Lab 10/26/15 1750  TROPONINI <0.03   RADIOLOGY:  Dg Chest 2 View  Result Date: 10/26/2015 CLINICAL DATA:  Fall when state.  Weakness and fatigue. EXAM: CHEST  2 VIEW COMPARISON:  Two-view chest x-ray 05/14/2015 FINDINGS: Heart size normal. Chronic elevation left hemidiaphragm is stable. There is no edema or effusion to suggest failure. Degenerative  changes of the thoracic spine are stable. IMPRESSION: No acute cardiopulmonary disease or significant interval change. Electronically Signed   By: San Morelle M.D.   On: 10/26/2015 18:59   Ct Head Wo Contrast  Result Date: 10/26/2015 CLINICAL DATA:  Syncopal episode now  with bruising to the nose, orbits and forehead. EXAM: CT HEAD WITHOUT CONTRAST CT CERVICAL SPINE WITHOUT CONTRAST TECHNIQUE: Multidetector CT imaging of the head and cervical spine was performed following the standard protocol without intravenous contrast. Multiplanar CT image reconstructions of the cervical spine were also generated. COMPARISON:  None. FINDINGS: CT HEAD FINDINGS There is a minimal amount of soft tissue stranding about the midline of the forehead (image 20, series 2, sagittal image 13, series 606). This finding is without associated radiopaque foreign body or displaced calvarial fracture. Normal noncontrast appearance of the bilateral orbits and globes. Post right-sided cataract surgery. No retrobulbar hematoma. Limited visualization of the paranasal sinuses and mastoid air cells is normal. No air-fluid levels. Mild atrophy with sulcal prominence centralized volume loss with commensurate ex vacuo dilatation the ventricular system. Scattered periventricular hypodensities compatible microvascular ischemic disease. Bilateral basal ganglial calcifications. No CT evidence of acute large territory infarct. Beam hardening beam hardening artifact involving the bilateral frontal lobe cortices, right greater than left. No definite intraparenchymal or extra-axial hemorrhage. No definite intraparenchymal or extra-axial mass on this noncontrast examination. Normal configuration of the ventricles and basilar cisterns. No midline shift. There is diffuse increased attenuation intracranial blood pool suggestive of volume depletion. CT CERVICAL SPINE FINDINGS C1 to the superior endplate of T2 There is minimal (approximately 2 mm) of anterolisthesis of C7 upon T1. The dens is normally positioned between the lateral masses of C1. Mild degenerative change of the atlantodental articulation with partial ossification of the transverse ligament. Normal atlantoaxial articulations. Presumed resection of the C7 right lamina  (representative axial image 58, series 5, sagittal image 20, series 603). The bilateral facets are normally aligned. Note is made of ankylosis involving in the left C2-C3 transverse process. No fracture or static subluxation of the cervical spine. Cervical vertebral body heights are preserved. Prevertebral soft tissues are normal. Moderate multilevel cervical spine DDD, worse at C5-C6 and C6-C7 with disc space height loss, endplate irregularity and sclerosis. Regional soft tissues appear normal. No bulky cervical lymphadenopathy. Normal appearance of the thyroid gland. Limited visualization of the lung apices is normal. IMPRESSION: Head CT Impression: 1. Minimal soft tissue swelling about the midline of the forehead without associated radiopaque foreign body, displaced calvarial fracture or acute intracranial process. 2. Advanced atrophy and mild microvascular ischemic disease. Cervical Spine CT Impression: 1. No fracture or static subluxation of the cervical spine. 2. Moderate multilevel cervical spine DDD, worse at C5-C6 and C6-C7. 3. Post presumed resection of the right C7 lamina. Electronically Signed   By: Sandi Mariscal M.D.   On: 10/26/2015 16:17   Ct Cervical Spine Wo Contrast  Result Date: 10/26/2015 CLINICAL DATA:  Syncopal episode now with bruising to the nose, orbits and forehead. EXAM: CT HEAD WITHOUT CONTRAST CT CERVICAL SPINE WITHOUT CONTRAST TECHNIQUE: Multidetector CT imaging of the head and cervical spine was performed following the standard protocol without intravenous contrast. Multiplanar CT image reconstructions of the cervical spine were also generated. COMPARISON:  None. FINDINGS: CT HEAD FINDINGS There is a minimal amount of soft tissue stranding about the midline of the forehead (image 20, series 2, sagittal image 13, series 606). This finding is without associated radiopaque foreign body or displaced calvarial fracture. Normal  noncontrast appearance of the bilateral orbits and globes.  Post right-sided cataract surgery. No retrobulbar hematoma. Limited visualization of the paranasal sinuses and mastoid air cells is normal. No air-fluid levels. Mild atrophy with sulcal prominence centralized volume loss with commensurate ex vacuo dilatation the ventricular system. Scattered periventricular hypodensities compatible microvascular ischemic disease. Bilateral basal ganglial calcifications. No CT evidence of acute large territory infarct. Beam hardening beam hardening artifact involving the bilateral frontal lobe cortices, right greater than left. No definite intraparenchymal or extra-axial hemorrhage. No definite intraparenchymal or extra-axial mass on this noncontrast examination. Normal configuration of the ventricles and basilar cisterns. No midline shift. There is diffuse increased attenuation intracranial blood pool suggestive of volume depletion. CT CERVICAL SPINE FINDINGS C1 to the superior endplate of T2 There is minimal (approximately 2 mm) of anterolisthesis of C7 upon T1. The dens is normally positioned between the lateral masses of C1. Mild degenerative change of the atlantodental articulation with partial ossification of the transverse ligament. Normal atlantoaxial articulations. Presumed resection of the C7 right lamina (representative axial image 58, series 5, sagittal image 20, series 603). The bilateral facets are normally aligned. Note is made of ankylosis involving in the left C2-C3 transverse process. No fracture or static subluxation of the cervical spine. Cervical vertebral body heights are preserved. Prevertebral soft tissues are normal. Moderate multilevel cervical spine DDD, worse at C5-C6 and C6-C7 with disc space height loss, endplate irregularity and sclerosis. Regional soft tissues appear normal. No bulky cervical lymphadenopathy. Normal appearance of the thyroid gland. Limited visualization of the lung apices is normal. IMPRESSION: Head CT Impression: 1. Minimal soft  tissue swelling about the midline of the forehead without associated radiopaque foreign body, displaced calvarial fracture or acute intracranial process. 2. Advanced atrophy and mild microvascular ischemic disease. Cervical Spine CT Impression: 1. No fracture or static subluxation of the cervical spine. 2. Moderate multilevel cervical spine DDD, worse at C5-C6 and C6-C7. 3. Post presumed resection of the right C7 lamina. Electronically Signed   By: Sandi Mariscal M.D.   On: 10/26/2015 16:17   ASSESSMENT AND PLAN:  Sylivia Lyon  is a 82 y.o. female with a known history of Hypertension, diabetes and diverticulitis. The patient has had poor oral intake and generalized weakness for the past one month. She passed out for a few minutes today, but she denies any seizure, incontinence or dizziness  *Acute renal failure Came in with creat 2.16 --1.5(baseline 1.1)  will hold lisinopril-HCTZ and ibuprofen continue IV fluid support and follow-up BMP.  *Syncope appears due to ARF. -PT to see _CT head nothing acute other than bruise over the frontal area  *UTI -po keflex  *Hypertension. Hold hypertension medication due to low side blood pressure.  *Diabetes -resume glyburide -holding metformin due to creatinine  * falls at home PT eval pending CM for d/c planning  * chronic left tibial shin ulcer -cont dressing changes as before  Case discussed with Care Management/Social Worker. Management plans discussed with the patient, family and they are in agreement.  CODE STATUS: FULL  DVT Prophylaxis: heparin  TOTAL TIME TAKING CARE OF THIS PATIENT: 30 minutes.  >50% time spent on counselling and coordination of care  POSSIBLE D/C IN 1-2 DAYS, DEPENDING ON CLINICAL CONDITION.  Note: This dictation was prepared with Dragon dictation along with smaller phrase technology. Any transcriptional errors that result from this process are unintentional.  Cipriana Biller M.D on 10/27/2015 at 9:01  AM  Between 7am to 6pm - Pager - [phone]  After  6pm go to [email or web address] - password [email or web address] [facility]  [facility]  Office  [phone]  CC: Primary care physician; [name], PA

## 2015-10-27 NOTE — Evaluation (Signed)
Physical Therapy Evaluation Patient Details Name: [name] MRN: [id number] DOB: Jul 16, 1931 Today's Date: 10/27/2015   History of Present Illness  82 yo female with wound on her anterior R lower leg was admitted with acute renal failure, UTI, fall and syncope symptoms.  PMHx:  HTN, DM, diverticulitis, DDD c-spine, resected R C7 lamina, bursitis   Clinical Impression  Pt was seen for assessing her mobility and to encourage more activity.  Pt is going to be assisted by nursing for gait training on the hall, then will anticipate her transition to [place] with assistance at all times from family until pt can safely navigate on her own.  Pt is going to be followed acutely for progression of her mobility and to work on LE strengthening with avoidance of stressing R lower leg skin.    Follow Up Recommendations Home health PT;Supervision for mobility/OOB;Supervision/Assistance - 24 hour    Equipment Recommendations  Rolling walker with 5" wheels    Recommendations for Other Services Rehab consult     Precautions / Restrictions Precautions Precautions: Fall Required Braces or Orthoses: Other Brace/Splint (Has a board on L arm to allow better IV access) Restrictions Weight Bearing Restrictions: No Other Position/Activity Restrictions: must keep L arm straight on elbow      Mobility  Bed Mobility Overal bed mobility: Needs Assistance Bed Mobility: Supine to Sit;Sit to Supine     Supine to sit: Min assist Sit to supine: Min assist   General bed mobility comments: assistance mainly for trunk to sit up and for legs to return to bed  Transfers Overall transfer level: Needs assistance Equipment used: Rolling walker (2 wheeled);1 person hand held assist Transfers: Sit to/from [facility] Sit to Stand: Min assist Stand pivot transfers: Min assist          Ambulation/Gait Ambulation/Gait assistance: Min assist;Min guard Ambulation Distance (Feet): 300  Feet Assistive device: Rolling walker (2 wheeled);1 person hand held assist Gait Pattern/deviations: Step-through pattern;Wide base of support;Trunk flexed;Decreased stride length (has hip flexion contractures that impact her stride) Gait velocity: reduced Gait velocity interpretation: Below normal speed for age/gender    Stairs            Wheelchair Mobility    Modified Rankin (Stroke Patients Only)       Balance Overall balance assessment: History of Falls                                           Pertinent Vitals/Pain Pain Assessment: Faces Faces Pain Scale: Hurts little more Pain Location: R lower leg Pain Intervention(s): Monitored during session;Repositioned;Premedicated before session    Home Living Family/patient expects to be discharged to:: Private residence Living Arrangements: Alone Available Help at Discharge: Family;Available PRN/intermittently Type of Home: House Home Access: Stairs to enter Entrance Stairs-Rails: Right;Left;Can reach both Entrance Stairs-Number of Steps: 3 Home Layout: One level Home Equipment: Cane - single point      Prior Function Level of Independence: Independent with assistive device(s)               Hand Dominance        Extremity/Trunk Assessment   Upper Extremity Assessment: Overall WFL for tasks assessed           Lower Extremity Assessment: Generalized weakness      Cervical / Trunk Assessment: Kyphotic  Communication   Communication: No difficulties  Cognition  Arousal/Alertness: Awake/alert Behavior During Therapy: WFL for tasks assessed/performed Overall Cognitive Status: Within Functional Limits for tasks assessed                      General Comments General comments (skin integrity, edema, etc.): Pt is in bed on side when PT arrives and is not expecting to use the RW but agrees.  Pt is going home with her family to assist her a week for 24/7 to avoid fall risk.     Exercises        Assessment/Plan    PT Assessment Patient needs continued PT services  PT Diagnosis Generalized weakness;Difficulty walking   PT Problem List Decreased strength;Decreased range of motion;Decreased activity tolerance;Decreased balance;Decreased mobility;Decreased coordination;Decreased knowledge of use of DME;Decreased safety awareness;Decreased knowledge of precautions;Obesity;Decreased skin integrity;Pain  PT Treatment Interventions DME instruction;Gait training;Stair training;Therapeutic activities;Functional mobility training;Therapeutic exercise;Balance training;Neuromuscular re-education;Patient/family education   PT Goals (Current goals can be found in the Care Plan section) Acute Rehab PT Goals Patient Stated Goal: to get home PT Goal Formulation: With patient/family Time For Goal Achievement: 11/10/15 Potential to Achieve Goals: Good    Frequency Min 2X/week   Barriers to discharge Inaccessible home environment;Decreased caregiver support will need family help to make transition home    Co-evaluation               End of Session Equipment Utilized During Treatment: Gait belt Activity Tolerance: Patient tolerated treatment well;Patient limited by fatigue;Patient limited by pain Patient left: in bed;with call bell/phone within reach;with bed alarm set;with family/visitor present Nurse Communication: Mobility status         Time: [id number] PT Time Calculation (min) (ACUTE ONLY): 32 min   Charges:   PT Evaluation $PT Eval Moderate Complexity: 1 Procedure PT Treatments $Gait Training: 8-22 mins   PT G CodesRamond Dial 11/05/15, 5:09 PM  Mee Hives, PT MS Acute Rehab Dept. Number: [place] and [place]

## 2015-10-27 NOTE — Progress Notes (Signed)
Initial Nutrition Assessment  DOCUMENTATION CODES:   Not applicable  INTERVENTION:  1.  General healthful diet; encourage intake of foods and beverages as able.  RD to follow and assess for nutritional adequacy. Discussed nutrition-related goals with patient and son.  Discussed ways to achieve goals. 2.  Supplements; Ensure Enlive po daily, each supplement provides 350 kcal and 20 grams of protein   NUTRITION DIAGNOSIS:   Inadequate oral intake related to altered GI function as evidenced by per patient/family report.   GOAL:   Patient will meet greater than or equal to 90% of their needs   MONITOR:   PO intake, Supplement acceptance  REASON FOR ASSESSMENT:   Consult Wound healing  ASSESSMENT:   Patient admitted after episode of syncope at home.  Patient multiple medical conditions including DM, recent bursitis, and shin wound requiring debridement and care at a wound center.  Patient states that with her bursitis and the shin wound, she has recently been taking increased amounts of NSAIDs.  Patient states she was mostly taking with meals, however at times without.  She began to feel her appetite was poor and foods "weren't agreeing with me."  Patient eventually started eating less.  Over the past week she has had little intake aside from cereal daily, at times some fruit and cheese.  Her last "regular" meal (chicken and vegetable sides) was last Sunday.  Discussed this poor intake with patient and son-in-law at bedside.  Discussed the role of nutrition in healing wounds as well as energy/functional status.  Patient is no longer taking NSAIDs, but this RD does question whether this played a role in stomach upset leading to poor intake.  Patient recently had an improvement in her A1C from 9.1 to 8.5.  Discussed a 2-week plan of increased and scheduled intake.  Patient agreeable to trying Ensure.   Diet Order:  Diet heart healthy/carb modified Room service appropriate? Yes; Fluid  consistency: Thin  Skin:  Wound (see comment)  Last BM:  8/17  Height:   Ht Readings from Last 1 Encounters:  10/26/15 5\' 1"  (1.549 m)    Weight:   Wt Readings from Last 1 Encounters:  10/26/15 166 lb 9.6 oz (75.6 kg)    Ideal Body Weight:     BMI:  Body mass index is 31.48 kg/m.  Estimated Nutritional Needs:   Kcal:  1750-1900  Protein:  75-82g  Fluid:  2.0 L/day  EDUCATION NEEDS:   Education needs addressed  Brynda Greathouse, MS RD LDN Clinical Inpatient Dietitian Weekend/After hours pager: [phone]

## 2015-10-28 LAB — BASIC METABOLIC PANEL
Anion gap: 7 (ref 5–15)
BUN: 37 mg/dL — ABNORMAL HIGH (ref 6–20)
CHLORIDE: 113 mmol/L — AB (ref 101–111)
CO2: 23 mmol/L (ref 22–32)
CREATININE: 0.96 mg/dL (ref 0.44–1.00)
Calcium: 8.4 mg/dL — ABNORMAL LOW (ref 8.9–10.3)
GFR, EST NON AFRICAN AMERICAN: 54 mL/min — AB (ref >60)
Glucose, Bld: 210 mg/dL — ABNORMAL HIGH (ref 65–99)
Potassium: 3.4 mmol/L — ABNORMAL LOW (ref 3.5–5.1)
SODIUM: 143 mmol/L (ref 135–145)

## 2015-10-28 LAB — URINE CULTURE: SPECIAL REQUESTS: NORMAL

## 2015-10-28 LAB — GLUCOSE, CAPILLARY: GLUCOSE-CAPILLARY: 144 mg/dL — AB (ref 65–99)

## 2015-10-28 MED ORDER — ENSURE ENLIVE PO LIQD: 237.0000 mL | ORAL | 12 refills | Status: DC

## 2015-10-28 MED ORDER — CEPHALEXIN 250 MG PO CAPS: 250.0000 mg | ORAL_CAPSULE | Freq: Two times a day (BID) | ORAL | 0 refills | Status: DC

## 2015-10-28 NOTE — Progress Notes (Signed)
Discharge instructions along with home medications and follow up gone over with patient and daughter. Both verbalize that they understood instructions. Home health PT with [facility] set up. No prescriptions given to patient. IV removed. Pt being discharged home on room air, no distress noted. Ammie Dalton, RN

## 2015-10-28 NOTE — Discharge Summary (Signed)
[facility] [facility] [place] NAME: [name]    MR#:  [id number]  DATE OF BIRTH:  [birth year]  DATE OF ADMISSION:  10/26/2015 ADMITTING PHYSICIAN: Demetrios Loll, MD  DATE OF DISCHARGE: 10/28/15  PRIMARY CARE PHYSICIAN: [name], PA    ADMISSION DIAGNOSIS:  Syncope and collapse [R55] AKI (acute kidney injury) ([place]) [N17.9]  DISCHARGE DIAGNOSIS:  Acute renal failure due to dehydration-resolved UTI  SECONDARY DIAGNOSIS:   Past Medical History:  Diagnosis Date  . Diabetes mellitus without complication ([place])   . Diverticulitis   . Hypertension     HOSPITAL COURSE:  [name] a 82 y.o.femalewith a known history of Hypertension, diabetes and diverticulitis. The patient has had poor oral intake and generalized weakness for the past one month. She passed out for a few minutes today, but she denies any seizure, incontinence or dizziness  *Acute renal failure Came in with creat 2.16 --1.5(baseline 1.1)--0.96  will resume now lisinopril-HCTZ Pt advised not to take NSAIDS Received IV fluid support   *Syncope appears due to ARF. -PT to see recommends HHPT _CT head nothing acute other than bruise over the frontal area  *UTI -po keflex  *Hypertension.  Resume bp meds at d/c  *Diabetes -resume glyburide -holding metformin due to creatinine---now resume at d/c  * falls at home PT eval noted CM for d/c planning  * chronic left tibial shin ulcer -cont dressing changes as before   Overall improved D/c home today Will d/w dter debbie when she gets here CONSULTS OBTAINED:    DRUG ALLERGIES:   Allergies  Allergen Reactions  . Morphine Sulfate Other (See Comments)    BP bottoms out  . Penicillins Diarrhea    Has patient had a PCN reaction causing immediate rash, facial/tongue/throat swelling, SOB or lightheadedness with hypotension: Yes Has patient had a PCN reaction causing severe rash involving mucus  membranes or skin necrosis: Yes Has patient had a PCN reaction that required hospitalization No Has patient had a PCN reaction occurring within the last 10 years: No If all of the above answers are "NO", then may proceed with Cephalosporin use.    DISCHARGE MEDICATIONS:   Current Discharge Medication List    START taking these medications   Details  cephALEXin (KEFLEX) 250 MG capsule Take 1 capsule (250 mg total) by mouth 2 (two) times daily. Qty: 8 capsule, Refills: 0    feeding supplement, ENSURE ENLIVE, (ENSURE ENLIVE) LIQD Take 237 mLs by mouth daily. Qty: 237 mL, Refills: 12      CONTINUE these medications which have NOT CHANGED   Details  Ascorbic Acid (VITAMIN C PO) Take by mouth.    aspirin 81 MG tablet Take by mouth.    budesonide (ENTOCORT EC) 3 MG 24 hr capsule TAKE 3  CAPSULES  ORAL, DAILY Qty: 90 capsule, Refills: 4    Cholecalciferol (VITAMIN D) 2000 UNITS CAPS Take by mouth.    furosemide (LASIX) 20 MG tablet TAKE ONE TABLET BY MOUTH DAILY Qty: 20 tablet, Refills: 0   Associated Diagnoses: Peripheral edema    glipiZIDE (GLUCOTROL XL) 2.5 MG 24 hr tablet Take 1 tablet (2.5 mg total) by mouth daily with breakfast. Qty: 30 tablet, Refills: 5   Associated Diagnoses: Type 2 diabetes mellitus without complication, without long-term current use of insulin (HCC)    lisinopril-hydrochlorothiazide (PRINZIDE,ZESTORETIC) 10-12.5 MG tablet 1 (ONE) TABLET, ORAL, DAILY Qty: 90 tablet, Refills: 1   Associated Diagnoses: Essential (primary) hypertension  metFORMIN (GLUCOPHAGE) 500 MG tablet Take 1 tablet (500 mg total) by mouth 2 (two) times daily with a meal. Qty: 180 tablet, Refills: 1   Associated Diagnoses: Type 2 diabetes mellitus without complication, without long-term current use of insulin (HCC)    Multiple Vitamin tablet Take by mouth. Reported on 03/13/2015    Multiple Vitamins-Minerals (ZINC PO) Take by mouth.    OMEGA-3 FATTY ACIDS PO Take by mouth.     simvastatin (ZOCOR) 20 MG tablet TAKE 1 TABLET BY MOUTH AT BEDTIME Qty: 90 tablet, Refills: 2    Blood Glucose Monitoring Suppl (ACCU-CHEK AVIVA CONNECT) W/DEVICE KIT ACCU-CHEK AVIVA (In Vitro Strip)  1 (one) Strip Strip daily for 0 days  Quantity: 100;  Refills: 3   Ordered :24-Oct-2013  Lynford Humphrey ;  Started 24-Oct-2013 Active Comments: Dx: 250.00 also include Accu-chek aviva meter    glucose blood test strip BAYER CONTOUR TEST (In Vitro Strip)  1 (one) Strip Strip to check blood sugar daily for 0 days  Quantity: 50;  Refills: 5   Ordered :20-May-2013  Gerald Leitz ;  Started 20-May-2013 Active Comments: & Lancets    traMADol (ULTRAM) 50 MG tablet Take 1 tablet (50 mg total) by mouth every 8 (eight) hours as needed. Qty: 30 tablet, Refills: 0   Associated Diagnoses: Open wound of right lower leg, subsequent encounter      STOP taking these medications     ibuprofen (ADVIL,MOTRIN) 200 MG tablet         If you experience worsening of your admission symptoms, develop shortness of breath, life threatening emergency, suicidal or homicidal thoughts you must seek medical attention immediately by calling 911 or calling your MD immediately  if symptoms less severe.  You Must read complete instructions/literature along with all the possible adverse reactions/side effects for all the Medicines you take and that have been prescribed to you. Take any new Medicines after you have completely understood and accept all the possible adverse reactions/side effects.   Please note  You were cared for by a hospitalist during your hospital stay. If you have any questions about your discharge medications or the care you received while you were in the hospital after you are discharged, you can call the unit and asked to speak with the hospitalist on call if the hospitalist that took care of you is not available. Once you are discharged, your primary care physician will handle any further medical issues.  Please note that NO REFILLS for any discharge medications will be authorized once you are discharged, as it is imperative that you return to your primary care physician (or establish a relationship with a primary care physician if you do not have one) for your aftercare needs so that they can reassess your need for medications and monitor your lab values. Today   SUBJECTIVE   No new complaints  VITAL SIGNS:  Blood pressure (!) 146/55, pulse 64, temperature 97.6 F (36.4 C), temperature source Oral, resp. rate 19, height '5\' 1"'$  (1.549 m), weight 166 lb 9.6 oz (75.6 kg), SpO2 97 %.  I/O:   Intake/Output Summary (Last 24 hours) at 10/28/15 1049 Last data filed at 10/28/15 0843  Gross per 24 hour  Intake          2157.08 ml  Output             1950 ml  Net           207.08 ml    PHYSICAL EXAMINATION:  GENERAL:  82 y.o.-year-old patient lying in the bed with no acute distress.  EYES: Pupils equal, round, reactive to light and accommodation. No scleral icterus. Extraocular muscles intact.  HEENT: Head atraumatic, normocephalic. Oropharynx and nasopharynx clear.  NECK:  Supple, no jugular venous distention. No thyroid enlargement, no tenderness.  LUNGS: Normal breath sounds bilaterally, no wheezing, rales,rhonchi or crepitation. No use of accessory muscles of respiration.  CARDIOVASCULAR: S1, S2 normal. No murmurs, rubs, or gallops.  ABDOMEN: Soft, non-tender, non-distended. Bowel sounds present. No organomegaly or mass.  EXTREMITIES: No pedal edema, cyanosis, or clubbing.chronic tibial shin ulcer+, no cellulitis NEUROLOGIC: Cranial nerves II through XII are intact. Muscle strength 5/5 in all extremities. Sensation intact. Gait not checked.  PSYCHIATRIC: The patient is alert and oriented x 3.  SKIN: No obvious rash, lesion, or ulcer.   DATA REVIEW:   CBC   Recent Labs Lab 10/27/15 0439  WBC 8.2  HGB 13.0  HCT 37.2  PLT 284    Chemistries   Recent Labs Lab 10/28/15 0944  NA  143  K 3.4*  CL 113*  CO2 23  GLUCOSE 210*  BUN 37*  CREATININE 0.96  CALCIUM 8.4*    Microbiology Results   No results found for this or any previous visit (from the past 240 hour(s)).  RADIOLOGY:  Dg Chest 2 View  Result Date: 10/26/2015 CLINICAL DATA:  Fall when state.  Weakness and fatigue. EXAM: CHEST  2 VIEW COMPARISON:  Two-view chest x-ray 05/14/2015 FINDINGS: Heart size normal. Chronic elevation left hemidiaphragm is stable. There is no edema or effusion to suggest failure. Degenerative changes of the thoracic spine are stable. IMPRESSION: No acute cardiopulmonary disease or significant interval change. Electronically Signed   By: San Morelle M.D.   On: 10/26/2015 18:59   Ct Head Wo Contrast  Result Date: 10/26/2015 CLINICAL DATA:  Syncopal episode now with bruising to the nose, orbits and forehead. EXAM: CT HEAD WITHOUT CONTRAST CT CERVICAL SPINE WITHOUT CONTRAST TECHNIQUE: Multidetector CT imaging of the head and cervical spine was performed following the standard protocol without intravenous contrast. Multiplanar CT image reconstructions of the cervical spine were also generated. COMPARISON:  None. FINDINGS: CT HEAD FINDINGS There is a minimal amount of soft tissue stranding about the midline of the forehead (image 20, series 2, sagittal image 13, series 606). This finding is without associated radiopaque foreign body or displaced calvarial fracture. Normal noncontrast appearance of the bilateral orbits and globes. Post right-sided cataract surgery. No retrobulbar hematoma. Limited visualization of the paranasal sinuses and mastoid air cells is normal. No air-fluid levels. Mild atrophy with sulcal prominence centralized volume loss with commensurate ex vacuo dilatation the ventricular system. Scattered periventricular hypodensities compatible microvascular ischemic disease. Bilateral basal ganglial calcifications. No CT evidence of acute large territory infarct. Beam  hardening beam hardening artifact involving the bilateral frontal lobe cortices, right greater than left. No definite intraparenchymal or extra-axial hemorrhage. No definite intraparenchymal or extra-axial mass on this noncontrast examination. Normal configuration of the ventricles and basilar cisterns. No midline shift. There is diffuse increased attenuation intracranial blood pool suggestive of volume depletion. CT CERVICAL SPINE FINDINGS C1 to the superior endplate of T2 There is minimal (approximately 2 mm) of anterolisthesis of C7 upon T1. The dens is normally positioned between the lateral masses of C1. Mild degenerative change of the atlantodental articulation with partial ossification of the transverse ligament. Normal atlantoaxial articulations. Presumed resection of the C7 right lamina (representative axial image 58, series 5, sagittal image 20, series  603). The bilateral facets are normally aligned. Note is made of ankylosis involving in the left C2-C3 transverse process. No fracture or static subluxation of the cervical spine. Cervical vertebral body heights are preserved. Prevertebral soft tissues are normal. Moderate multilevel cervical spine DDD, worse at C5-C6 and C6-C7 with disc space height loss, endplate irregularity and sclerosis. Regional soft tissues appear normal. No bulky cervical lymphadenopathy. Normal appearance of the thyroid gland. Limited visualization of the lung apices is normal. IMPRESSION: Head CT Impression: 1. Minimal soft tissue swelling about the midline of the forehead without associated radiopaque foreign body, displaced calvarial fracture or acute intracranial process. 2. Advanced atrophy and mild microvascular ischemic disease. Cervical Spine CT Impression: 1. No fracture or static subluxation of the cervical spine. 2. Moderate multilevel cervical spine DDD, worse at C5-C6 and C6-C7. 3. Post presumed resection of the right C7 lamina. Electronically Signed   By: Simonne Come  M.D.   On: 10/26/2015 16:17   Ct Cervical Spine Wo Contrast  Result Date: 10/26/2015 CLINICAL DATA:  Syncopal episode now with bruising to the nose, orbits and forehead. EXAM: CT HEAD WITHOUT CONTRAST CT CERVICAL SPINE WITHOUT CONTRAST TECHNIQUE: Multidetector CT imaging of the head and cervical spine was performed following the standard protocol without intravenous contrast. Multiplanar CT image reconstructions of the cervical spine were also generated. COMPARISON:  None. FINDINGS: CT HEAD FINDINGS There is a minimal amount of soft tissue stranding about the midline of the forehead (image 20, series 2, sagittal image 13, series 606). This finding is without associated radiopaque foreign body or displaced calvarial fracture. Normal noncontrast appearance of the bilateral orbits and globes. Post right-sided cataract surgery. No retrobulbar hematoma. Limited visualization of the paranasal sinuses and mastoid air cells is normal. No air-fluid levels. Mild atrophy with sulcal prominence centralized volume loss with commensurate ex vacuo dilatation the ventricular system. Scattered periventricular hypodensities compatible microvascular ischemic disease. Bilateral basal ganglial calcifications. No CT evidence of acute large territory infarct. Beam hardening beam hardening artifact involving the bilateral frontal lobe cortices, right greater than left. No definite intraparenchymal or extra-axial hemorrhage. No definite intraparenchymal or extra-axial mass on this noncontrast examination. Normal configuration of the ventricles and basilar cisterns. No midline shift. There is diffuse increased attenuation intracranial blood pool suggestive of volume depletion. CT CERVICAL SPINE FINDINGS C1 to the superior endplate of T2 There is minimal (approximately 2 mm) of anterolisthesis of C7 upon T1. The dens is normally positioned between the lateral masses of C1. Mild degenerative change of the atlantodental articulation with  partial ossification of the transverse ligament. Normal atlantoaxial articulations. Presumed resection of the C7 right lamina (representative axial image 58, series 5, sagittal image 20, series 603). The bilateral facets are normally aligned. Note is made of ankylosis involving in the left C2-C3 transverse process. No fracture or static subluxation of the cervical spine. Cervical vertebral body heights are preserved. Prevertebral soft tissues are normal. Moderate multilevel cervical spine DDD, worse at C5-C6 and C6-C7 with disc space height loss, endplate irregularity and sclerosis. Regional soft tissues appear normal. No bulky cervical lymphadenopathy. Normal appearance of the thyroid gland. Limited visualization of the lung apices is normal. IMPRESSION: Head CT Impression: 1. Minimal soft tissue swelling about the midline of the forehead without associated radiopaque foreign body, displaced calvarial fracture or acute intracranial process. 2. Advanced atrophy and mild microvascular ischemic disease. Cervical Spine CT Impression: 1. No fracture or static subluxation of the cervical spine. 2. Moderate multilevel cervical spine DDD, worse at  C5-C6 and C6-C7. 3. Post presumed resection of the right C7 lamina. Electronically Signed   By: Sandi Mariscal M.D.   On: 10/26/2015 16:17     Management plans discussed with the patient, family and they are in agreement.  CODE STATUS:     Code Status Orders        Start     Ordered   10/26/15 2057  Full code  Continuous     10/26/15 2056    Code Status History    Date Active Date Inactive Code Status Order ID Comments User Context   This patient has a current code status but no historical code status.    Advance Directive Documentation   Flowsheet Row Most Recent Value  Type of Advance Directive  Healthcare Power of Attorney  Pre-existing out of facility DNR order (yellow form or pink MOST form)  No data  "MOST" Form in Place?  No data     TOTAL TIME  TAKING CARE OF THIS PATIENT: 40  minutes.    Meggie Laseter M.D on 10/28/2015 at 10:49 AM  Between 7am to 6pm - Pager - [phone] After 6pm go to [email or web address] - password [email or web address] [facility]  [facility]  Office  [phone]  CC: Primary care physician; [name], PA

## 2015-10-28 NOTE — Care Management Note (Signed)
Case Management Note  Patient Details  Name: [name] MRN: [id number] Date of Birth: 04/18/1931  Subjective/Objective:   Referral for home health PT was called to Ardeen Fillers at [place] [facility]. Family chose [facility] per a family member works there.                  Action/Plan:   Expected Discharge Date:                  Expected Discharge Plan:     In-House Referral:     Discharge planning Services     Post Acute Care Choice:    Choice offered to:     DME Arranged:    DME Agency:     HH Arranged:    HH Agency:     Status of Service:     If discussed at [employer] of Stay Meetings, dates discussed:    Additional Comments:  Omarr Hann A, RN 10/28/2015, 10:58 AM

## 2015-10-28 NOTE — Discharge Instructions (Signed)
HHPT °

## 2015-10-29 ENCOUNTER — Telehealth: Payer: Self-pay | Admitting: Family Medicine

## 2015-10-29 DIAGNOSIS — S81801D Unspecified open wound, right lower leg, subsequent encounter: Secondary | ICD-10-CM

## 2015-10-29 DIAGNOSIS — R2689 Other abnormalities of gait and mobility: Secondary | ICD-10-CM

## 2015-10-29 NOTE — Progress Notes (Signed)
[name], [name] ([id number]) Visit Report for 10/26/2015 Chief Complaint Document Details Patient Name: [name], [name] 10/26/2015 12:45 Date of Service: PM Medical Record [id number] Number: Patient Account Number: [id number] [birth year] (82 y.o. Treating RN: [name] Date of Birth/Sex: Female) Other Clinician: Primary Care Physician: [name] Treating [name] Referring Physician: Wilhemena [name] Physician/Extender: [name] in Treatment: 4 Information Obtained from: Patient Chief Complaint Patient presents to the wound care center for a consult due non healing wound to the right lower extremity which she's had for about 3 weeks now Electronic Signature(s) Signed: 10/26/2015 2:40:17 PM By: [name] Companion MD Signed: 10/29/2015 4:08:58 PM By: [name] Entered By: [name] Companion on 10/26/2015 14:40:17 [name], [name] ([id number]) -------------------------------------------------------------------------------- Debridement Details Patient Name: [name] 10/26/2015 12:45 Date of Service: PM Medical Record [id number] Number: Patient Account Number: [id number] February 03, 1932 (82 y.o. Treating RN: [name] Date of Birth/Sex: Female) Other Clinician: Primary Care Physician: [name] Treating Carmello Cabiness Referring Physician: Wilhemena [name] Physician/Extender: [name] in Treatment: 4 Debridement Performed for Wound #3 Right,Anterior Lower Leg Assessment: Performed By: Physician [name] Fudge, MD Debridement: Debridement Pre-procedure Yes Verification/Time Out Taken: Start Time: 13:23 Pain Control: Lidocaine 4% Topical Solution Level: Skin/Subcutaneous Tissue Total Area Debrided (L x 5.2 (cm) x 5 (cm) = 26 (cm) W): Tissue and other Viable, Non-Viable, Eschar, Fibrin/Slough, Subcutaneous material debrided: Instrument: Curette Bleeding: Minimum Hemostasis Achieved: Pressure End Time: 13:25 Procedural Pain:  0 Post Procedural Pain: 0 Response to Treatment: Procedure was tolerated well Post Debridement Measurements of Total Wound Length: (cm) 5.2 Width: (cm) 5 Depth: (cm) 0.3 Volume: (cm) 6.126 Post Procedure Diagnosis Same as Pre-procedure Electronic Signature(s) Signed: 10/26/2015 4:59:06 PM By: [name] Signed: 10/29/2015 4:08:58 PM By: [name] Entered By: [name] on 10/26/2015 13:24:53 [name], [name] ([id number]) -------------------------------------------------------------------------------- HPI Details Patient Name: [name]. 10/26/2015 12:45 Date of Service: PM Medical Record [id number] Number: Patient Account Number: [id number] Jan 10, 1932 (82 y.o. Treating RN: [name] Date of Birth/Sex: Female) Other Clinician: Primary Care Physician: [name] Treating [name] Referring Physician: Wilhemena [name] Physician/Extender: [name] in Treatment: 4 History of Present Illness Location: lacerated wound to the right lower extremity Quality: Patient reports experiencing a dull pain to affected area(s). Severity: Patient states wound (s) are getting better. Duration: Patient has had the wound for < 3 weeks prior to presenting for treatment Timing: Pain in wound is Intermittent (comes and goes Context: The wound occurred when the patient had a blunt injury which caused a laceration to the right lower extremity Modifying Factors: Other treatment(s) tried include:was seen in the ER on 09/15/2015 and sutured with 4-0 nylon sutures. Associated Signs and Symptoms: Patient reports having increase swelling. HPI Description: 82 year old patient who had an injury to her right lower extremity on July 8 while she was in [place] boarding a plane. She was treated at [facility] with sutures being placed on 09/15/2015. She was recently seen last week by her PCP edema of the feet and legs and after review as advised to take Lasix 20 mg  daily. past hemoglobin A1c done in June 2017 was 9.1% Her past medical history is significant for diabetes mellitus, arthritis, basal cell carcinoma, collagenous colitis, essential hypertension, avitaminosis D. She is also status post abdominal hysterectomy, lumbar discectomy, neck surgery. She is a former smoker and quit in 1979. Of note in the recent past she was seen by Dr. Lucky [name] for what seems a sounds like injections  of varicose veins. This was only around her ankles and not in the lower extremities. No Doppler studies were done. She was not advised to wear any compression stockings. Electronic Signature(s) Signed: 10/26/2015 2:40:25 PM By: [name] Companion MD Signed: 10/29/2015 4:08:58 PM By: [name] Entered By: [name] Companion on 10/26/2015 14:40:24 [name], [name] ([id number]) -------------------------------------------------------------------------------- Physical Exam Details Patient Name: [name] 10/26/2015 12:45 Date of Service: PM Medical Record [id number] Number: Patient Account Number: [id number] [birth year] (82 y.o. Treating RN: [name] Date of Birth/Sex: Female) Other Clinician: Primary Care Physician: [name] Treating [name] Referring Physician: Wilhemena [name] Physician/Extender: [name] in Treatment: 4 Electronic Signature(s) Signed: 10/26/2015 2:40:35 PM By: [name] Companion MD Signed: 10/29/2015 4:08:58 PM By: [name] Entered By: [name] Companion on 10/26/2015 14:40:35 [name] ([id number]) -------------------------------------------------------------------------------- Physician Orders Details Patient Name: [name], [name] 10/26/2015 12:45 Date of Service: PM Medical Record [id number] Number: Patient Account Number: [id number] [birth year] (82 y.o. Treating RN: [name] Date of Birth/Sex: Female) Other Clinician: Primary Care Physician: [name] Treating [name] Referring Physician: Wilhemena [name] Physician/Extender: [name] in Treatment: 4 Verbal / Phone Orders: Yes Clinician: Montey [name] Read Back and Verified: Yes Diagnosis Coding Wound Cleansing Wound #3 Right,Anterior Lower Leg o Clean wound with Normal Saline. o Cleanse wound with mild soap and water o May Shower, gently pat wound dry prior to applying new dressing. Anesthetic Wound #3 Right,Anterior Lower Leg o Topical Lidocaine 4% cream applied to wound bed prior to debridement - for clinic use Primary Wound Dressing Wound #3 Right,Anterior Lower Leg o Santyl Ointment Secondary Dressing Wound #3 Right,Anterior Lower Leg o ABD pad o Conform/Kerlix - conform netting and tape o Non-adherent pad Dressing Change Frequency Wound #3 Right,Anterior Lower Leg o Change dressing every day. - may change more if needed due to drainage Follow-up Appointments Wound #3 Right,Anterior Lower Leg o Return Appointment in 1 week. Additional Orders / Instructions Wound #3 Right,Anterior Lower Leg o Increase protein intake. Cervenka, Erik R. ([id number]) Medications-please add to medication list. Wound #3 Right,Anterior Lower Leg o Santyl Enzymatic Ointment o Other: - Vitamin A, Vitamin C, Zinc, Multivitamin Electronic Signature(s) Signed: 10/26/2015 4:59:06 PM By: [name] Signed: 10/29/2015 4:08:58 PM By: [name] Entered By: [name] on 10/26/2015 13:25:28 Vanderwerf, [name] ([id number]) -------------------------------------------------------------------------------- Problem List Details Patient Name: [name], [name] 10/26/2015 12:45 Date of Service: PM Medical Record [id number] Number: Patient Account Number: [id number] 1931-11-22 (82 y.o. 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/05/2015 Yes exposed I87.311 Chronic venous hypertension (idiopathic) with ulcer of 10/05/2015 Yes right lower extremity Inactive Problems Resolved Problems Electronic Signature(s) Signed: 10/26/2015 2:40:07 PM By: [name] Companion MD Signed: 10/29/2015 4:08:58 PM By: [name] Entered By: [name] Companion on 10/26/2015 14:40:05 [name] ([id number]) -------------------------------------------------------------------------------- Progress Note Details Patient Name: [name], [name] 10/26/2015 12:45 Date of Service: PM Medical Record [id number] Number: Patient Account Number: [id number] 1931-03-16 (82 y.o. Treating RN: [name] Date of Birth/Sex: Female) Other Clinician: Primary Care Physician: [name] Treating [name] Referring Physician:  Wilhemena [name] Physician/Extender: Weeks in Treatment: 4 Subjective Chief Complaint Information obtained from Patient Patient presents to the wound care center for a consult due non healing wound to the right lower extremity which she's had for about 3 weeks now History of Present Illness (HPI) The following HPI elements were documented for the patient's wound: Location: lacerated wound to the right lower extremity Quality: Patient reports experiencing a dull pain to affected area(s). Severity: Patient states wound (s) are getting better. Duration: Patient has had the wound for < 3 weeks prior to presenting for treatment Timing: Pain in wound is Intermittent (comes and goes Context: The wound occurred when the patient had a blunt injury which caused a  laceration to the right lower extremity Modifying Factors: Other treatment(s) tried include:was seen in the ER on 09/15/2015 and sutured with 4-0 nylon sutures. Associated Signs and Symptoms: Patient reports having increase swelling. 82 year old patient who had an injury to her right lower extremity on July 8 while she was in [place] boarding a plane. She was treated at [facility] with sutures being placed on 09/15/2015. She was recently seen last week by her PCP edema of the feet and legs and after review as advised to take Lasix 20 mg daily. past hemoglobin A1c done in June 2017 was 9.1% Her past medical history is significant for diabetes mellitus, arthritis, basal cell carcinoma, collagenous colitis, essential hypertension, avitaminosis D. She is also status post abdominal hysterectomy, lumbar discectomy, neck surgery. She is a former smoker and quit in 1979. Of note in the recent past she was seen by Dr. Lucky [name] for what seems a sounds like injections of varicose veins. This was only around her ankles and not in the lower extremities. No Doppler studies were done. She was not advised to wear any compression stockings. [name], [name] ([id number]) Objective Constitutional Vitals Time Taken: 12:53 PM, Height: 61 in, Weight: 170 lbs, BMI: 32.1, Pulse: 75 bpm, Respiratory Rate: 20 breaths/min, Blood Pressure: 107/68 mmHg. Integumentary (Hair, Skin) Wound #3 status is Open. Original cause of wound was Trauma. The wound is located on the Right,Anterior Lower Leg. The wound measures 5.2cm length x 5cm width x 0.3cm depth; 20.42cm^2 area and 6.126cm^3 volume. The wound is limited to skin breakdown. There is no tunneling or undermining noted. There is a large amount of serous drainage noted. The wound margin is distinct with the outline attached to the wound base. There is small (1-33%) red, pink granulation within the wound bed. There is a large (67-100%) amount of necrotic tissue within the wound  bed including Eschar and Adherent Slough. The periwound skin appearance had no abnormalities noted for color. The periwound skin appearance exhibited: Localized Edema, Moist. The periwound skin appearance did not exhibit: Callus, Crepitus, Excoriation, Fluctuance, Friable, Induration, Rash, Scarring, Dry/Scaly, Maceration. Periwound temperature was noted as No Abnormality. The periwound has tenderness on palpation. 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 There was a Skin/Subcutaneous Tissue Debridement [id number]) debridement with total area of 26 sq cm performed by [name] Fudge, MD. with the following instrument(s): Curette to remove Viable and Non-Viable tissue/material including Fibrin/Slough, Eschar, and Subcutaneous after achieving pain control using Lidocaine  4% Topical Solution. A time out was conducted prior to the start of the procedure. A Minimum amount of bleeding was controlled with Pressure. The procedure was tolerated well with a pain level of 0 throughout and a pain level of 0 following the procedure. Post Debridement Measurements: 5.2cm length x 5cm width x 0.3cm depth; 6.126cm^3 volume. [name], [name] ([id number]) Post procedure Diagnosis Wound #3: Same as Pre-Procedure Traumatic wound right leg. Ulcer 5 cm and debrided with curette. Dressed with alginate and compression dressing. Patient apparently had a fainting episode and advised to go to ER Plan Wound Cleansing: Wound #3 Right,Anterior Lower Leg: Clean wound with Normal Saline. Cleanse wound with mild soap and water May Shower, gently  pat wound dry prior to applying new dressing. Anesthetic: Wound #3 Right,Anterior Lower Leg: Topical Lidocaine 4% cream applied to wound bed prior to debridement - for clinic use Primary Wound Dressing: Wound #3 Right,Anterior Lower Leg: Santyl Ointment Secondary Dressing: Wound #3 Right,Anterior Lower Leg: ABD pad Conform/Kerlix - conform netting and tape Non-adherent pad Dressing Change Frequency: Wound #3 Right,Anterior Lower Leg: Change dressing every day. - may change more if needed due to drainage Follow-up Appointments: Wound #3 Right,Anterior Lower Leg: Return Appointment in 1 week. Additional Orders / Instructions: Wound #3 Right,Anterior Lower Leg: Increase protein intake. Medications-please add to medication list.: Wound #3 Right,Anterior Lower Leg: Santyl Enzymatic Ointment Other: - Vitamin A, Vitamin C, Zinc, Multivitamin Follow-Up Appointments: A follow-up appointment should be scheduled. Medication Reconciliation completed and provided to Patient/Care Provider. A Patient Clinical Summary of Care was provided to bh [name], [name] ([id number]) Electronic Signature(s) Signed: 10/26/2015 2:43:50 PM By: [name] Companion MD Signed: 10/29/2015 4:08:58 PM By: [name] Entered By: [name] Companion on 10/26/2015 14:43:50 [name], STACHOWSKI ([id number]) -------------------------------------------------------------------------------- SuperBill Details Patient Name: [name] Date of Service: 10/26/2015 Medical Record Number: [id number] Patient Account Number: [id number] Date of Birth/Sex: May 17, 1931 (82 y.o. Female) Treating RN: Carolyne Fiscal, Debi Primary Care Physician: [name] Mon, Delfino Lovett Other Clinician: Referring Physician: Cranford Mon, Delfino Lovett Treating Physician/Extender: Frann Rider in Treatment: 4 Diagnosis Coding ICD-10 Codes Code Description E11.622 Type 2 diabetes mellitus with other skin ulcer S81.811A Laceration without foreign body,  right lower leg, initial encounter L97.212 Non-pressure chronic ulcer of right calf with fat layer exposed I87.311 Chronic venous hypertension (idiopathic) with ulcer of right lower extremity Facility Procedures CPT4 Code Description: [id number] - DEB SUBQ TISSUE 20 SQ CM/< ICD-10 Description Diagnosis E11.622 Type 2 diabetes mellitus with other skin ulcer Modifier: Quantity: 1 CPT4 Code Description: [id number] - DEB SUBQ TISS EA ADDL 20CM ICD-10 Description Diagnosis I87.311 Chronic venous hypertension (idiopathic) with ulcer of Modifier: right lower Quantity: 1 extremity Physician Procedures CPT4 Code Description: [id number] - WC PHYS LEVEL 3 - EST PT ICD-10 Description Diagnosis L97.212 Non-pressure chronic ulcer of right calf with fat layer Modifier: exposed Quantity: 1 CPT4 Code Description: [id number] 11042 - WC PHYS SUBQ TISS 20 SQ CM ICD-10 Description Diagnosis E11.622 Type 2 diabetes mellitus with other skin ulcer Modifier: Quantity: 1 CPT4 Code Description: [id number] - WC PHYS SUBQ TISS EA ADDL 20 CM ICD-10 Description Diagnosis Mihalik, Samamtha R. ([id number]) Modifier: Quantity: 1 Electronic Signature(s) Signed: 10/26/2015 2:44:38 PM By: [name] Companion MD Signed: 10/29/2015 4:08:58 PM By: [name] Entered By: [name] Companion on 10/26/2015 14:44:37

## 2015-10-29 NOTE — Telephone Encounter (Signed)
Claiborne Billings would like to get a rolling walker order for this patient please.

## 2015-10-29 NOTE — Progress Notes (Signed)
ARISBEL, MAIONE ([id number]) Visit Report for 10/26/2015 Arrival Information Details Patient Name: [name], [name] Date of Service: 10/26/2015 12:45 PM Medical Record Number: [id number] Patient Account Number: [id number] Date of Birth/Sex: Oct 10, 1931 (82 y.o. Female) Treating RN: Ashok Cordia, Debi Primary Care Physician: Wendelyn Breslow, Gerlene Burdock Other Clinician: Referring Physician: Wendelyn Breslow, Gerlene Burdock Treating Physician/Extender: Rudene Re in Treatment: 4 Visit Information History Since Last Visit All ordered tests and consults were completed: No Patient Arrived: [name] Added or deleted any medications: No Arrival Time: 12:52 Any new allergies or adverse reactions: No Accompanied By: friend Had a fall or experienced change in No Transfer Assistance: None activities of daily living that may affect Patient Identification Verified: Yes risk of falls: Secondary Verification Process Yes Signs or symptoms of abuse/neglect since last No Completed: visito Patient Requires Transmission- No Hospitalized since last visit: No Based Precautions: Pain Present Now: Yes Patient Has Alerts: Yes Patient Alerts: Patient on Blood Thinner aspirin BMI 1.04 to rt lower leg Electronic Signature(s) Signed: 10/29/2015 4:58:13 PM By: Alejandro Mulling Entered By: Alejandro Mulling on 10/26/2015 12:53:26 [name], [name] ([id number]) -------------------------------------------------------------------------------- Encounter Discharge Information Details Patient Name: [name] Date of Service: 10/26/2015 12:45 PM Medical Record Number: [id number] Patient Account Number: [id number] Date of Birth/Sex: 1931/09/11 (82 y.o. Female) Treating RN: Ashok Cordia, Debi Primary Care Physician: Wendelyn Breslow, Gerlene Burdock Other Clinician: Referring Physician: Wendelyn Breslow, RICHARD Treating Physician/Extender: Rudene Re in Treatment: 4 Encounter Discharge Information Items Discharge Pain Level:  0 Discharge Condition: Stable Ambulatory Status: Cane Discharge Destination: Home Transportation: Private Auto Accompanied By: friend Schedule Follow-up Appointment: Yes Medication Reconciliation completed Yes and provided to Patient/Care Brighten Buzzelli: Provided on Clinical Summary of Care: 10/26/2015 Form Type Recipient Paper Patient bh Electronic Signature(s) Signed: 10/26/2015 1:33:40 PM By: Francie Massing Entered By: Francie Massing on 10/26/2015 13:33:40 [name], [name] ([id number]) -------------------------------------------------------------------------------- Lower Extremity Assessment Details Patient Name: [name] Date of Service: 10/26/2015 12:45 PM Medical Record Number: [id number] Patient Account Number: [id number] Date of Birth/Sex: March 29, 1931 (82 y.o. Female) Treating RN: Ashok Cordia, Debi Primary Care Physician: Megan Mans Other Clinician: Referring Physician: Wendelyn Breslow, RICHARD Treating Physician/Extender: Rudene Re in Treatment: 4 Vascular Assessment Pulses: Posterior Tibial Dorsalis Pedis Palpable: [Right:Yes] Extremity colors, hair growth, and conditions: Extremity Color: [Right:Normal] Temperature of Extremity: [Right:Warm] Capillary Refill: [Right:< 3 seconds] Toe Nail Assessment Left: Right: Thick: No Discolored: No Deformed: No Improper Length and Hygiene: No Electronic Signature(s) Signed: 10/29/2015 4:58:13 PM By: Alejandro Mulling Entered By: Alejandro Mulling on 10/26/2015 12:56:47 [name], [name] ([id number]) -------------------------------------------------------------------------------- Multi Wound Chart Details Patient Name: [name] Date of Service: 10/26/2015 12:45 PM Medical Record Number: [id number] Patient Account Number: [id number] Date of Birth/Sex: [birth year] (82 y.o. Female) Treating RN: Curtis Sites Primary Care Physician: Wendelyn Breslow, Gerlene Burdock Other Clinician: Referring Physician: Wendelyn Breslow,  RICHARD Treating Physician/Extender: Rudene Re in Treatment: 4 Vital Signs Height(in): 61 Pulse(bpm): 75 Weight(lbs): 170 Blood Pressure 107/68 (mmHg): Body Mass Index(BMI): 32 Temperature(F): Respiratory Rate 20 (breaths/min): Photos: [3:No Photos] [N/A:N/A] Wound Location: [3:Right Lower Leg - Anterior N/A] Wounding Event: [3:Trauma] [N/A:N/A] Primary Etiology: [3:Trauma, Other] [N/A:N/A] Comorbid History: [3:Cataracts, Hypertension, N/A Colitis, Type II Diabetes, Osteoarthritis] Date Acquired: [3:09/15/2015] [N/A:N/A] Weeks of Treatment: [3:4] [N/A:N/A] Wound Status: [3:Open] [N/A:N/A] Measurements L x W x D 5.2x5x0.3 [N/A:N/A] (cm) Area (cm) : [3:20.42] [N/A:N/A] Volume (cm) : [3:6.126] [N/A:N/A] % Reduction in Area: [3:63.90%] [N/A:N/A] % Reduction in Volume: -8.30% [N/A:N/A] Classification: [3:Full Thickness Without Exposed Support Structures] [N/A:N/A] HBO Classification: [3:Grade 1] [N/A:N/A]  Exudate Amount: [3:Large] [N/A:N/A] Exudate Type: [3:Serous] [N/A:N/A] Exudate Color: [3:amber] [N/A:N/A] Foul Odor After [3:Yes] [N/A:N/A] Cleansing: Odor Anticipated Due to No [N/A:N/A] Product Use: Wound Margin: [3:Distinct, outline attached N/A] Granulation Amount: [3:Small (1-33%)] [N/A:N/A] Granulation Quality: [3:Red, Pink] [N/A:N/A] Necrotic Amount: Large (67-100%) N/A N/A Necrotic Tissue: Eschar, Adherent Slough N/A N/A Exposed Structures: Fascia: No N/A N/A Fat: No Tendon: No Muscle: No Joint: No Bone: No Limited to Skin Breakdown Epithelialization: None N/A N/A Periwound Skin Texture: Edema: Yes N/A N/A Excoriation: No Induration: No Callus: No Crepitus: No Fluctuance: No Friable: No Rash: No Scarring: No Periwound Skin Moist: Yes N/A N/A Moisture: Maceration: No Dry/Scaly: No Periwound Skin Color: No Abnormalities Noted N/A N/A Temperature: No Abnormality N/A N/A Tenderness on Yes N/A N/A Palpation: Wound Preparation: Ulcer  Cleansing: N/A N/A Rinsed/Irrigated with Saline Topical Anesthetic Applied: Other: lidocaine 4% Treatment Notes Electronic Signature(s) Signed: 10/26/2015 4:59:06 PM By: Montey Hora Entered By: Montey Hora on 10/26/2015 13:22:31 [name], [name] ([id number]) -------------------------------------------------------------------------------- Multi-Disciplinary Care Plan Details Patient Name: [name] Date of Service: 10/26/2015 12:45 PM Medical Record Number: [id number] Patient Account Number: [id number] Date of Birth/Sex: 03-16-1931 (82 y.o. Female) Treating RN: Montey Hora Primary Care Physician: Cranford Mon, Delfino Lovett Other Clinician: Referring Physician: Cranford Mon, Delfino Lovett Treating Physician/Extender: Frann Rider in Treatment: 4 Active Inactive Abuse / Safety / Falls / Self Care Management Nursing Diagnoses: Potential for falls Goals: Patient/caregiver will verbalize understanding of skin care regimen Date Initiated: 09/28/2015 Goal Status: Active Patient/caregiver will verbalize/demonstrate measures taken to prevent injury and/or falls Date Initiated: 09/28/2015 Goal Status: Active Interventions: Assess fall risk on admission and as needed Assess: immobility, friction, shearing, incontinence upon admission and as needed Assess impairment of mobility on admission and as needed per policy Assess self care needs on admission and as needed Provide education on fall prevention Notes: Orientation to the Wound Care Program Nursing Diagnoses: Knowledge deficit related to the wound healing center program Goals: Patient/caregiver will verbalize understanding of the [facility] Date Initiated: 09/28/2015 Goal Status: Active Interventions: Provide education on orientation to the wound center Notes: Filley, Jamillia R. ([id number]) Pain, Acute or Chronic Nursing Diagnoses: Potential alteration in comfort, pain Goals: Patient will verbalize  adequate pain control and receive pain control interventions during procedures as needed Date Initiated: 09/28/2015 Goal Status: Active Patient/caregiver will verbalize adequate pain control between visits Date Initiated: 09/28/2015 Goal Status: Active Patient/caregiver will verbalize comfort level met Date Initiated: 09/28/2015 Goal Status: Active Interventions: Assess comfort goal upon admission Complete pain assessment as per visit requirements Encourage patient to take pain medications as prescribed Notes: Wound/Skin Impairment Nursing Diagnoses: Impaired tissue integrity Goals: Ulcer/skin breakdown will heal within 14 weeks Date Initiated: 09/28/2015 Goal Status: Active Interventions: Assess patient/caregiver ability to obtain necessary supplies Assess patient/caregiver ability to perform ulcer/skin care regimen upon admission and as needed Provide education on ulcer and skin care Notes: Electronic Signature(s) Signed: 10/26/2015 4:59:06 PM By: Montey Hora Entered By: Montey Hora on 10/26/2015 13:22:05 [name], [name] Catholic ([id number]) -------------------------------------------------------------------------------- Pain Assessment Details Patient Name: [name] Date of Service: 10/26/2015 12:45 PM Medical Record Number: [id number] Patient Account Number: [id number] Date of Birth/Sex: [birth year] (82 y.o. Female) Treating RN: Carolyne Fiscal, Debi Primary Care Physician: Wilhemena Durie Other Clinician: Referring Physician: Wilhemena Durie Treating Physician/Extender: Frann Rider in Treatment: 4 Active Problems Location of Pain Severity and Description of Pain Patient Has Paino Yes Site Locations Pain Location: Pain in Ulcers With Dressing Change: Yes Duration of  the Pain. Constant / Intermittento Constant Rate the pain. Current Pain Level: 4 Worst Pain Level: 7 Least Pain Level: 2 Character of Pain Describe the Pain: Aching Pain Management  and Medication Current Pain Management: Electronic Signature(s) Signed: 10/29/2015 4:58:13 PM By: Alejandro Mulling Entered By: Alejandro Mulling on 10/26/2015 12:53:43 [name] ([id number]) -------------------------------------------------------------------------------- Patient/Caregiver Education Details Patient Name: [name] Date of Service: 10/26/2015 12:45 PM Medical Record Number: [id number] Patient Account Number: [id number] Date of Birth/Gender: 03-07-1932 (82 y.o. Female) Treating RN: Ashok Cordia, Debi Primary Care Physician: Wendelyn Breslow, Gerlene Burdock Other Clinician: Referring Physician: Wendelyn Breslow, RICHARD Treating Physician/Extender: Rudene Re in Treatment: 4 Education Assessment Education Provided To: Patient Education Topics Provided Wound/Skin Impairment: Handouts: Other: change dressing as ordered Methods: Demonstration, Explain/Verbal Responses: State content correctly Electronic Signature(s) Signed: 10/29/2015 4:58:13 PM By: Alejandro Mulling Entered By: Alejandro Mulling on 10/26/2015 13:11:38 [name], [name] ([id number]) -------------------------------------------------------------------------------- Wound Assessment Details Patient Name: [name] Date of Service: 10/26/2015 12:45 PM Medical Record Number: [id number] Patient Account Number: [id number] Date of Birth/Sex: 03-12-1931 (82 y.o. Female) Treating RN: Ashok Cordia, Debi Primary Care Physician: Wendelyn Breslow, Gerlene Burdock Other Clinician: Referring Physician: Wendelyn Breslow, RICHARD Treating Physician/Extender: Rudene Re in Treatment: 4 Wound Status Wound Number: 3 Primary Trauma, Other Etiology: Wound Location: Right Lower Leg - Anterior Wound Open Wounding Event: Trauma Status: Date Acquired: 09/15/2015 Comorbid Cataracts, Hypertension, Colitis, Type Weeks Of Treatment: 4 History: II Diabetes, Osteoarthritis Clustered Wound: No Photos Photo Uploaded By: Alejandro Mulling on 10/26/2015 15:28:15 Wound Measurements Length: (cm) 5.2 Width: (cm) 5 Depth: (cm) 0.3 Area: (cm) 20.42 Volume: (cm) 6.126 % Reduction in Area: 63.9% % Reduction in Volume: -8.3% Epithelialization: None Tunneling: No Undermining: No Wound Description Full Thickness Without Foul Odor Af Classification: Exposed Support Structures Due to Produ Diabetic Severity Grade 1 (Wagner): Wound Margin: Distinct, outline attached Exudate Amount: Large Exudate Type: Serous Exudate Color: amber ter Cleansing: Yes ct Use: No Wound Bed Granulation Amount: Small (1-33%) Exposed Structure Granulation Quality: Red, Pink Fascia Exposed: No [name], [name] ([id number]) Necrotic Amount: Large (67-100%) Fat Layer Exposed: No Necrotic Quality: Eschar, Adherent Slough Tendon Exposed: No Muscle Exposed: No Joint Exposed: No Bone Exposed: No Limited to Skin Breakdown Periwound Skin Texture Texture Color No Abnormalities Noted: No No Abnormalities Noted: Yes Callus: No Temperature / Pain Crepitus: No Temperature: No Abnormality Excoriation: No Tenderness on Palpation: Yes Fluctuance: No Friable: No Induration: No Localized Edema: Yes Rash: No Scarring: No Moisture No Abnormalities Noted: No Dry / Scaly: No Maceration: No Moist: Yes Wound Preparation Ulcer Cleansing: Rinsed/Irrigated with Saline Topical Anesthetic Applied: Other: lidocaine 4%, Treatment Notes Wound #3 (Right, Anterior Lower Leg) 1. Cleansed with: Clean wound with Normal Saline Cleanse wound with antibacterial soap and water 2. Anesthetic Topical Lidocaine 4% cream to wound bed prior to debridement 4. Dressing Applied: Santyl Ointment 5. Secondary Dressing Applied Kerlix/Conform Non-Adherent pad 7. Secured with Tape Notes netting [name], [name] ([id number]) Electronic Signature(s) Signed: 10/29/2015 4:58:13 PM By: Alejandro Mulling Entered By: Alejandro Mulling on 10/26/2015  13:02:03 [name], [name] ([id number]) -------------------------------------------------------------------------------- Vitals Details Patient Name: [name] Date of Service: 10/26/2015 12:45 PM Medical Record Number: [id number] Patient Account Number: [id number] Date of Birth/Sex: 08-Mar-1932 (82 y.o. Female) Treating RN: Ashok Cordia, Debi Primary Care Physician: Megan Mans Other Clinician: Referring Physician: Wendelyn Breslow, Gerlene Burdock Treating Physician/Extender: Rudene Re in Treatment: 4 Vital Signs Time Taken: 12:53 Pulse (bpm): 75 Height (in): 61 Respiratory Rate (breaths/min): 20 Weight (lbs): 170  Blood Pressure (mmHg): 107/68 Body Mass Index (BMI): 32.1 Reference Range: 80 - 120 mg / dl Electronic Signature(s) Signed: 10/29/2015 4:58:13 PM By: Alric Quan Entered By: Alric Quan on 10/26/2015 12:55:57

## 2015-11-02 ENCOUNTER — Encounter: Payer: Medicare Other | Admitting: Surgery

## 2015-11-02 DIAGNOSIS — S81811A Laceration without foreign body, right lower leg, initial encounter: Secondary | ICD-10-CM | POA: Diagnosis not present

## 2015-11-02 DIAGNOSIS — M1990 Unspecified osteoarthritis, unspecified site: Secondary | ICD-10-CM | POA: Diagnosis not present

## 2015-11-02 DIAGNOSIS — I10 Essential (primary) hypertension: Secondary | ICD-10-CM | POA: Diagnosis not present

## 2015-11-02 DIAGNOSIS — L97212 Non-pressure chronic ulcer of right calf with fat layer exposed: Secondary | ICD-10-CM | POA: Diagnosis not present

## 2015-11-02 DIAGNOSIS — I87311 Chronic venous hypertension (idiopathic) with ulcer of right lower extremity: Secondary | ICD-10-CM | POA: Diagnosis not present

## 2015-11-02 DIAGNOSIS — Z87891 Personal history of nicotine dependence: Secondary | ICD-10-CM | POA: Diagnosis not present

## 2015-11-02 DIAGNOSIS — E11622 Type 2 diabetes mellitus with other skin ulcer: Secondary | ICD-10-CM | POA: Diagnosis not present

## 2015-11-02 DIAGNOSIS — X58XXXA Exposure to other specified factors, initial encounter: Secondary | ICD-10-CM | POA: Diagnosis not present

## 2015-11-02 DIAGNOSIS — E559 Vitamin D deficiency, unspecified: Secondary | ICD-10-CM | POA: Diagnosis not present

## 2015-11-02 DIAGNOSIS — Z79899 Other long term (current) drug therapy: Secondary | ICD-10-CM | POA: Diagnosis not present

## 2015-11-03 NOTE — Progress Notes (Signed)
MADALEINE, QUINLIN ([id number]) Visit Report for 11/02/2015 Chief Complaint Document Details Patient Name: [name], [name] 11/02/2015 10:45 Date of Service: AM Medical Record [id number] Number: Patient Account Number: [id number] August 18, 1931 (82 y.o. Treating RN: Cornell Barman Date of Birth/Sex: Female) Other Clinician: Primary Care Physician: [name] Treating Christin Fudge Referring Physician: Wilhemena Durie Physician/Extender: Suella Grove in Treatment: 5 Information Obtained from: Patient Chief Complaint Patient presents to the wound care center for a consult due non healing wound to the right lower extremity which she's had for about 3 weeks now Electronic Signature(s) Signed: 11/02/2015 11:28:40 AM By: Christin Fudge MD, FACS Entered By: Christin Fudge on 11/02/2015 11:28:40 [name] ([id number]) -------------------------------------------------------------------------------- Debridement Details Patient Name: [name]. 11/02/2015 10:45 Date of Service: AM Medical Record [id number] Number: Patient Account Number: [id number] Nov 12, 1931 (82 y.o. Treating RN: Cornell Barman Date of Birth/Sex: Female) Other Clinician: Primary Care Physician: [name] Treating Nikita Surman Referring Physician: Wilhemena Durie Physician/Extender: Suella Grove in Treatment: 5 Debridement Performed for Wound #3 Right,Anterior Lower Leg Assessment: Performed By: Physician Christin Fudge, MD Debridement: Debridement Pre-procedure Yes - 11:19 Verification/Time Out Taken: Start Time: 11:19 Pain Control: Other : lidocaine 4% Level: Skin/Subcutaneous Tissue Total Area Debrided (L x 3.5 (cm) x 4.5 (cm) = 15.75 (cm) W): Tissue and other Viable, Non-Viable, Eschar, Exudate, Fibrin/Slough, Subcutaneous material debrided: Instrument: Curette Bleeding: Minimum Hemostasis Achieved: Pressure End Time: 11:23 Procedural Pain: 0 Post Procedural Pain: 0 Response to Treatment:  Procedure was tolerated well Post Debridement Measurements of Total Wound Length: (cm) 3.5 Width: (cm) 4.5 Depth: (cm) 0.3 Volume: (cm) 3.711 Character of Wound/Ulcer Post Improved Debridement: Severity of Tissue Post Debridement: Fat layer exposed Post Procedure Diagnosis Same as Pre-procedure Electronic Signature(s) Signed: 11/02/2015 11:28:33 AM By: Christin Fudge MD, FACS Signed: 11/02/2015 5:00:50 PM By: Gretta Cool RN, BSN, Kim RN, BSN Dacey, Anesa R. ([id number]) Entered By: Christin Fudge on 11/02/2015 11:28:33 ANNALYSE, DARENSBOURG ([id number]) -------------------------------------------------------------------------------- HPI Details Patient Name: [name] 11/02/2015 10:45 Date of Service: AM Medical Record [id number] Number: Patient Account Number: [id number] Oct 16, 1931 (82 y.o. Treating RN: Cornell Barman Date of Birth/Sex: Female) Other Clinician: Primary Care Physician: [name] Treating Christin Fudge Referring Physician: Wilhemena Durie Physician/Extender: Suella Grove in Treatment: 5 History of Present Illness Location: lacerated wound to the right lower extremity Quality: Patient reports experiencing a dull pain to affected area(s). Severity: Patient states wound (s) are getting better. Duration: Patient has had the wound for < 3 weeks prior to presenting for treatment Timing: Pain in wound is Intermittent (comes and goes Context: The wound occurred when the patient had a blunt injury which caused a laceration to the right lower extremity Modifying Factors: Other treatment(s) tried include:was seen in the ER on 09/15/2015 and sutured with 4-0 nylon sutures. Associated Signs and Symptoms: Patient reports having increase swelling. HPI Description: 82 year old patient who had an injury to her right lower extremity on July 8 while she was in [place] boarding a plane. She was treated at [facility] with sutures being placed on 09/15/2015. She was recently seen  last week by her PCP edema of the feet and legs and after review as advised to take Lasix 20 mg daily. past hemoglobin A1c done in June 2017 was 9.1% Her past medical history is significant for diabetes mellitus, arthritis, basal cell carcinoma, collagenous colitis, essential hypertension, avitaminosis D. She is also status post abdominal hysterectomy, lumbar discectomy, neck surgery. She is a former smoker and quit in 1979. Of note in the recent  past she was seen by Dr. Lucky Cowboy for what seems a sounds like injections of varicose veins. This was only around her ankles and not in the lower extremities. No Doppler studies were done. She was not advised to wear any compression stockings. 11/02/2015 -- was admitted to the hospital between 8/18 and 10/28/2015 for a syncopal attack and was found to have acute renal failure, UTI and her x-rays checked out okay. She was evaluated and discharged home on Keflex which she is completing today. Electronic Signature(s) Signed: 11/02/2015 11:29:33 AM By: Christin Fudge MD, FACS Entered By: Christin Fudge on 11/02/2015 11:29:33 BREAJA, STENNIS ([id number]) -------------------------------------------------------------------------------- Physical Exam Details Patient Name: [name] 11/02/2015 10:45 Date of Service: AM Medical Record [id number] Number: Patient Account Number: [id number] 11-03-1931 (82 y.o. Treating RN: Cornell Barman Date of Birth/Sex: Female) Other Clinician: Primary Care Physician: [name] Treating Christin Fudge Referring Physician: Cranford Mon, RICHARD Physician/Extender: Suella Grove in Treatment: 5 Constitutional . Pulse regular. Respirations normal and unlabored. Afebrile. . Eyes Nonicteric. Reactive to light. Ears, Nose, Mouth, and Throat Lips, teeth, and gums WNL.Marland Kitchen Moist mucosa without lesions. Neck supple and nontender. No palpable supraclavicular or cervical adenopathy. Normal sized without goiter. Respiratory WNL. No  retractions.. Cardiovascular Pedal Pulses WNL. No clubbing, cyanosis or edema. Chest Breasts symmetical and no nipple discharge.. Breast tissue WNL, no masses, lumps, or tenderness.. Lymphatic No adneopathy. No adenopathy. No adenopathy. Musculoskeletal Adexa without tenderness or enlargement.. Digits and nails w/o clubbing, cyanosis, infection, petechiae, ischemia, or inflammatory conditions.. Integumentary (Hair, Skin) No suspicious lesions. No crepitus or fluctuance. No peri-wound warmth or erythema. No masses.Marland Kitchen Psychiatric Judgement and insight Intact.. No evidence of depression, anxiety, or agitation.. Notes the wound has a thick layer of slough which I was easily able to remove with a #3 curet and bleeding controlled with pressure. There is some healthy granulation tissue at the base of the wound Electronic Signature(s) Signed: 11/02/2015 11:30:09 AM By: Christin Fudge MD, FACS Entered By: Christin Fudge on 11/02/2015 11:30:08 JALAILA, CADENA ([id number]) -------------------------------------------------------------------------------- Physician Orders Details Patient Name: JOLINA, HOLBROOK 11/02/2015 10:45 Date of Service: AM Medical Record [id number] Number: Patient Account Number: [id number] 1931/12/25 (82 y.o. Treating RN: Cornell Barman Date of Birth/Sex: Female) Other Clinician: Primary Care Physician: [name] Treating Christin Fudge Referring Physician: Wilhemena Durie Physician/Extender: Suella Grove in Treatment: 5 Verbal / Phone Orders: Yes Clinician: Cornell Barman Read Back and Verified: Yes Diagnosis Coding Wound Cleansing Wound #3 Right,Anterior Lower Leg o Clean wound with Normal Saline. o Cleanse wound with mild soap and water o May Shower, gently pat wound dry prior to applying new dressing. Anesthetic Wound #3 Right,Anterior Lower Leg o Topical Lidocaine 4% cream applied to wound bed prior to debridement - for clinic use Primary Wound  Dressing Wound #3 Right,Anterior Lower Leg o Santyl Ointment Secondary Dressing Wound #3 Right,Anterior Lower Leg o ABD pad o Conform/Kerlix - conform netting and tape o Non-adherent pad Dressing Change Frequency Wound #3 Right,Anterior Lower Leg o Change dressing every day. - may change more if needed due to drainage Follow-up Appointments Wound #3 Right,Anterior Lower Leg o Return Appointment in 1 week. Additional Orders / Instructions Wound #3 Right,Anterior Lower Leg o Increase protein intake. TOSHUA, BORSA ([id number]) Medications-please add to medication list. Wound #3 Right,Anterior Lower Leg o Santyl Enzymatic Ointment o Other: - Vitamin A, Vitamin C, Zinc, Multivitamin Electronic Signature(s) Signed: 11/02/2015 4:35:01 PM By: Christin Fudge MD, FACS Signed: 11/02/2015 5:00:50 PM By: Gretta Cool RN, BSN, Kim RN, BSN  Entered By: Gretta Cool, RN, BSN, Kim on 11/02/2015 11:24:56 KAELANI, BROADSTREET ([id number]) -------------------------------------------------------------------------------- Problem List Details Patient Name: TIQUANA, LIBBY 11/02/2015 10:45 Date of Service: AM Medical Record [id number] Number: Patient Account Number: [id number] 1931-09-17 (82 y.o. 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 Treating RN: Cornell Barman Date of Birth/Sex: Female) Other Clinician: Primary Care Physician: [name] Treating Christin Fudge Referring Physician: Wilhemena Durie Physician/Extender: Suella Grove in Treatment: 5 Subjective Chief Complaint Information obtained from Patient Patient presents to the wound care center for a consult due non healing wound to the right lower extremity which she's had for about 3 weeks now History of Present Illness (HPI) The following HPI elements were documented for the patient's wound: Location: lacerated wound to the right lower extremity Quality: Patient reports experiencing a dull pain to affected area(s). Severity: Patient states wound (s) are getting better. Duration: Patient has had the wound for < 3 weeks prior to presenting for treatment Timing: Pain in wound is Intermittent (comes and goes Context: The wound occurred when the patient had a blunt injury which caused a laceration to the right lower extremity Modifying Factors: Other treatment(s) tried include:was seen in the ER on 09/15/2015 and sutured with 4-0 nylon sutures. Associated Signs and Symptoms: Patient reports having increase swelling. 82 year old patient who had an injury to her right lower extremity on July 8 while she was in [place] boarding a plane. She was treated at [facility] with sutures being placed on 09/15/2015. She was recently seen last week by her PCP edema of the feet and legs and after review as advised to take Lasix 20 mg daily. past hemoglobin A1c done in June  2017 was 9.1% Her past medical history is significant for diabetes mellitus, arthritis, basal cell carcinoma, collagenous colitis, essential hypertension, avitaminosis D. She is also status post abdominal hysterectomy, lumbar discectomy, neck surgery. She is a former smoker and quit in 1979. Of note in the recent past she was seen by Dr. Lucky Cowboy for what seems a sounds like injections of varicose veins. This was only around her ankles and not in the lower extremities. No Doppler studies were done. She was not advised to wear any compression stockings. 11/02/2015 -- was admitted to the hospital between 8/18 and 10/28/2015 for a syncopal attack and was found to have acute renal failure, UTI and her x-rays checked out okay. She was evaluated and discharged home on Keflex which she is completing today. Kron, Megahn R. ([id number]) Objective Constitutional Pulse regular. Respirations normal and unlabored. Afebrile. Vitals Time Taken: 10:57 AM, Height: 61 in, Weight: 170 lbs, BMI: 32.1, Temperature: 97.7 F, Pulse: 61 bpm, Respiratory Rate: 18 breaths/min, Blood Pressure: 137/56 mmHg. Eyes Nonicteric. Reactive to light. Ears, Nose, Mouth, and  Throat Lips, teeth, and gums WNL.Marland Kitchen Moist mucosa without lesions. Neck supple and nontender. No palpable supraclavicular or cervical adenopathy. Normal sized without goiter. Respiratory WNL. No retractions.. Cardiovascular Pedal Pulses WNL. No clubbing, cyanosis or edema. Chest Breasts symmetical and no nipple discharge.. Breast tissue WNL, no masses, lumps, or tenderness.. Lymphatic No adneopathy. No adenopathy. No adenopathy. Musculoskeletal Adexa without tenderness or enlargement.. Digits and nails w/o clubbing, cyanosis, infection, petechiae, ischemia, or inflammatory conditions.Marland Kitchen Psychiatric Judgement and insight Intact.. No evidence of depression, anxiety, or agitation.. General Notes: the wound has a thick layer of slough which I was easily  able to remove with a #3 curet and bleeding controlled with pressure. There is some healthy granulation tissue at the base of the wound Integumentary (Hair, Skin) No suspicious lesions. No crepitus or fluctuance. No peri-wound warmth or erythema. No masses.. Wound #3 status is Open. Original cause of wound was Trauma. The wound is located on the Right,Anterior Lower Leg. The wound measures 3.5cm length x 4.5cm width x 0.3cm depth; 12.37cm^2 area and 3.711cm^3 volume. The wound is limited to skin breakdown. There is a large amount of serous Manganelli, Mikiah R. ([id number]) drainage noted. The wound margin is distinct with the outline attached to the wound base. There is small (1-33%) red, pink granulation within the wound bed. There is a large (67-100%) amount of necrotic tissue within the wound bed including Adherent Slough. The periwound skin appearance had no abnormalities noted for color. The periwound skin appearance exhibited: Localized Edema, Maceration. The periwound skin appearance did not exhibit: Callus, Crepitus, Excoriation, Fluctuance, Friable, Induration, Rash, Scarring, Dry/Scaly, Moist. Periwound temperature was noted as No Abnormality. The periwound has tenderness on palpation. 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 There was a Skin/Subcutaneous Tissue Debridement [id number]) debridement with total area of 15.75 sq cm performed by Christin Fudge, MD. with the following instrument(s): Curette to remove Viable and Non-Viable tissue/material including Exudate, Fibrin/Slough, Eschar, and Subcutaneous  after achieving pain control using Other (lidocaine 4%). A time out was conducted at 11:19, prior to the start of the procedure. A Minimum amount of bleeding was controlled with Pressure. The procedure was tolerated well with a pain level of 0 throughout and a pain level of 0 following the procedure. Post Debridement Measurements: 3.5cm length x 4.5cm width x 0.3cm depth; 3.711cm^3 volume. Character of Wound/Ulcer Post Debridement is improved. Severity of Tissue Post Debridement is: Fat layer exposed. Post procedure Diagnosis Wound #3: Same as Pre-Procedure Plan Wound Cleansing: Everding, [place] R. ([id number]) Wound #3 Right,Anterior Lower Leg: Clean wound with Normal Saline. Cleanse wound with mild soap and water May Shower, gently pat wound dry prior to applying new dressing. Anesthetic: Wound #3 Right,Anterior Lower Leg: Topical Lidocaine 4% cream applied to wound bed prior to debridement - for clinic use Primary Wound Dressing: Wound #3 Right,Anterior Lower Leg: Santyl Ointment Secondary Dressing: Wound #3 Right,Anterior Lower Leg: ABD pad Conform/Kerlix - conform netting and tape Non-adherent pad Dressing Change Frequency: Wound #3 Right,Anterior Lower Leg: Change dressing every day. - may change more if needed due to drainage Follow-up Appointments: Wound #3 Right,Anterior Lower Leg: Return Appointment in 1 week. Additional Orders /  Instructions: Wound #3 Right,Anterior Lower Leg: Increase protein intake. Medications-please add to medication list.: Wound #3 Right,Anterior Lower Leg: Santyl Enzymatic Ointment Other: - Vitamin A, Vitamin C, Zinc, Multivitamin I have recommended: 1. Washing the wound daily with soap and water and applying Santyl ointment and a gauze and Kerlix dressing over this. 2. Elevation of the limbs as much as possible. 3. some stage when the wound is clean enough I may use a wound VAC for a while 4. control of her diabetes mellitus 5. Protein  supplements, vitamin A, vitamin C and zinc 6. Regular visits to the wound care center. Electronic Signature(s) Signed: 11/02/2015 11:30:55 AM By: Christin Fudge MD, FACS Entered By: Christin Fudge on 11/02/2015 11:30:54 TROYCE, MEACHEM ([id number]) -------------------------------------------------------------------------------- SuperBill Details Patient Name: [name] Date of Service: 11/02/2015 Medical Record Number: [id number] Patient Account Number: [id number] Date of Birth/Sex: [birth year] (82 y.o. Female) Treating RN: Cornell Barman Primary Care Physician: [name] Other Clinician: Referring Physician: Cranford Mon, Delfino Lovett Treating Physician/Extender: Frann Rider in Treatment: 5 Diagnosis Coding ICD-10 Codes Code Description E11.622 Type 2 diabetes mellitus with other skin ulcer S81.811A Laceration without foreign body, right lower leg, initial encounter L97.212 Non-pressure chronic ulcer of right calf with fat layer exposed I87.311 Chronic venous hypertension (idiopathic) with ulcer of right lower extremity Facility Procedures CPT4 Code Description: [id number] - DEB SUBQ TISSUE 20 SQ CM/< ICD-10 Description Diagnosis E11.622 Type 2 diabetes mellitus with other skin ulcer S81.811A Laceration without foreign body, right lower leg, initi L97.212 Non-pressure chronic ulcer of  right calf with fat layer I87.311 Chronic venous hypertension (idiopathic) with ulcer of Modifier: al encounte exposed right lower Quantity: 1 r extremity Physician Procedures CPT4 Code Description: [id number] - WC PHYS LEVEL 3 - EST PT ICD-10 Description Diagnosis E11.622 Type 2 diabetes mellitus with other skin ulcer S81.811A Laceration without foreign body, right lower leg, initi L97.212 Non-pressure chronic ulcer of  right calf with fat layer I87.311 Chronic venous hypertension (idiopathic) with ulcer of Modifier: 25 al encounte exposed right lower Quantity: 1 r extremity CPT4  Code Description: [id number] - WC PHYS SUBQ TISS 20 SQ CM ICD-10 Description Diagnosis E11.622 Type 2 diabetes mellitus with other skin ulcer S81.811A Laceration without foreign body, right lower leg, initi L97.212 Non-pressure chronic ulcer of  right calf with fat layer I87.311 Chronic venous hypertension (idiopathic) with ulcer of Aker, Adriona R. ([id number]) Modifier: al encounte exposed right lower Quantity: 1 r extremity Electronic Signature(s) Signed: 11/02/2015 11:32:28 AM By: Christin Fudge MD, FACS Previous Signature: 11/02/2015 11:31:12 AM Version By: Christin Fudge MD, FACS Entered By: Christin Fudge on 11/02/2015 11:32:28

## 2015-11-03 NOTE — Progress Notes (Signed)
[name], [name] ([id number]) Visit Report for 11/02/2015 Arrival Information Details Patient Name: [name], [name] Date of Service: 11/02/2015 10:45 AM Medical Record Number: [id number] Patient Account Number: [id number] Date of Birth/Sex: 1931/11/26 (82 y.o. Female) Treating RN: Cornell Barman Primary Care Physician: [name] Other Clinician: Referring Physician: Cranford Mon, Delfino Lovett Treating Physician/Extender: Frann Rider in Treatment: 5 Visit Information History Since Last Visit Added or deleted any medications: No Patient Arrived: Cane Any new allergies or adverse reactions: No Arrival Time: 10:56 Had a fall or experienced change in No Accompanied By: friend activities of daily living that may affect Transfer Assistance: None risk of falls: Patient Identification Verified: Yes Signs or symptoms of abuse/neglect since last No Secondary Verification Process Yes visito Completed: Pain Present Now: No Patient Requires Transmission- No Based Precautions: Patient Has Alerts: Yes Patient Alerts: Patient on Blood Thinner aspirin BMI 1.04 to rt lower leg Electronic Signature(s) Signed: 11/02/2015 5:00:50 PM By: Gretta Cool, RN, BSN, Kim RN, BSN Entered By: Gretta Cool, RN, BSN, Kim on 11/02/2015 10:57:02 [name] ([id number]) -------------------------------------------------------------------------------- Encounter Discharge Information Details Patient Name: [name] Date of Service: 11/02/2015 10:45 AM Medical Record Number: [id number] Patient Account Number: [id number] Date of Birth/Sex: [birth year] (82 y.o. Female) Treating RN: Cornell Barman Primary Care Physician: [name] Other Clinician: Referring Physician: Cranford Mon, Delfino Lovett Treating Physician/Extender: Frann Rider in Treatment: 5 Encounter Discharge Information Items Discharge Pain Level: 0 Discharge Condition: Stable Ambulatory Status: Cane Discharge Destination:  Home Private Transportation: Auto Accompanied By: friend Schedule Follow-up Appointment: Yes Medication Reconciliation completed and Yes provided to Patient/Care Kasaundra Fahrney: Clinical Summary of Care: Electronic Signature(s) Signed: 11/02/2015 5:00:50 PM By: Gretta Cool RN, BSN, Kim RN, BSN Previous Signature: 11/02/2015 11:31:23 AM Version By: Ruthine Dose Entered By: Gretta Cool RN, BSN, Kim on 11/02/2015 11:31:36 [name], [name] ([id number]) -------------------------------------------------------------------------------- Multi Wound Chart Details Patient Name: [name] Date of Service: 11/02/2015 10:45 AM Medical Record Number: [id number] Patient Account Number: [id number] Date of Birth/Sex: [birth year] (82 y.o. Female) Treating RN: Cornell Barman Primary Care Physician: [name] Other Clinician: Referring Physician: Cranford Mon, RICHARD Treating Physician/Extender: Frann Rider in Treatment: 5 Vital Signs Height(in): 61 Pulse(bpm): 61 Weight(lbs): 170 Blood Pressure 137/56 (mmHg): Body Mass Index(BMI): 32 Temperature(F): 97.7 Respiratory Rate 18 (breaths/min): Photos: [N/A:N/A] Wound Location: Right Lower Leg - Anterior N/A N/A Wounding Event: Trauma N/A N/A Primary Etiology: Trauma, Other N/A N/A Comorbid History: Cataracts, Hypertension, N/A N/A Colitis, Type II Diabetes, Osteoarthritis Date Acquired: 09/15/2015 N/A N/A Weeks of Treatment: 5 N/A N/A Wound Status: Open N/A N/A Measurements L x W x D 3.5x4.5x0.3 N/A N/A (cm) Area (cm) : 12.37 N/A N/A Volume (cm) : 3.711 N/A N/A % Reduction in Area: 78.10% N/A N/A % Reduction in Volume: 34.40% N/A N/A Classification: Full Thickness Without N/A N/A Exposed Support Structures HBO Classification: Grade 1 N/A N/A Exudate Amount: Large N/A N/A Exudate Type: Serous N/A N/A Exudate Color: amber N/A N/A Yes N/A N/A Kozlowski, Cordella R. ([id number]) Foul Odor After Cleansing: Odor Anticipated Due to No  N/A N/A Product Use: Wound Margin: Distinct, outline attached N/A N/A Granulation Amount: Small (1-33%) N/A N/A Granulation Quality: Red, Pink N/A N/A Necrotic Amount: Large (67-100%) N/A N/A Exposed Structures: Fascia: No N/A N/A Fat: No Tendon: No Muscle: No Joint: No Bone: No Limited to Skin Breakdown Epithelialization: None N/A N/A Debridement: Debridement (26333- N/A N/A 11047) Pre-procedure 11:19 N/A N/A Verification/Time Out Taken: Pain Control: Other N/A N/A Tissue Debrided: Necrotic/Eschar, N/A N/A Fibrin/Slough, Exudates, Subcutaneous  Level: Skin/Subcutaneous N/A N/A Tissue Debridement Area (sq 15.75 N/A N/A cm): Instrument: Curette N/A N/A Bleeding: Minimum N/A N/A Hemostasis Achieved: Pressure N/A N/A Procedural Pain: 0 N/A N/A Post Procedural Pain: 0 N/A N/A Debridement Treatment Procedure was tolerated N/A N/A Response: well Post Debridement 3.5x4.5x0.3 N/A N/A Measurements L x W x D (cm) Post Debridement 3.711 N/A N/A Volume: (cm) Periwound Skin Texture: Edema: Yes N/A N/A Excoriation: No Induration: No Callus: No Crepitus: No Fluctuance: No Friable: No Fulp, Anabella R. ([id number]) Rash: No Scarring: No Periwound Skin Maceration: Yes N/A N/A Moisture: Moist: No Dry/Scaly: No Periwound Skin Color: Atrophie Blanche: No N/A N/A Cyanosis: No Ecchymosis: No Erythema: No Hemosiderin Staining: No Mottled: No Pallor: No Rubor: No Temperature: No Abnormality N/A N/A Tenderness on Yes N/A N/A Palpation: Wound Preparation: Ulcer Cleansing: N/A N/A Rinsed/Irrigated with Saline Topical Anesthetic Applied: Other: lidocaine 4% Procedures Performed: Debridement N/A N/A Treatment Notes Electronic Signature(s) Signed: 11/02/2015 5:00:50 PM By: Gretta Cool, RN, BSN, Kim RN, BSN Entered By: Gretta Cool, RN, BSN, Kim on 11/02/2015 11:30:27 [name], [name]  ([id number]) -------------------------------------------------------------------------------- Multi-Disciplinary Care Plan Details Patient Name: [name] Date of Service: 11/02/2015 10:45 AM Medical Record Number: [id number] Patient Account Number: [id number] Date of Birth/Sex: [birth year] (82 y.o. Female) Treating RN: Cornell Barman Primary Care Physician: [name] Other Clinician: Referring Physician: Cranford Mon, Delfino Lovett Treating Physician/Extender: Frann Rider in Treatment: 5 Active Inactive Abuse / Safety / Falls / Self Care Management Nursing Diagnoses: Potential for falls Goals: Patient/caregiver will verbalize understanding of skin care regimen Date Initiated: 09/28/2015 Goal Status: Active Patient/caregiver will verbalize/demonstrate measures taken to prevent injury and/or falls Date Initiated: 09/28/2015 Goal Status: Active Interventions: Assess fall risk on admission and as needed Assess: immobility, friction, shearing, incontinence upon admission and as needed Assess impairment of mobility on admission and as needed per policy Assess self care needs on admission and as needed Provide education on fall prevention Notes: Orientation to the Wound Care Program Nursing Diagnoses: Knowledge deficit related to the wound healing center program Goals: Patient/caregiver will verbalize understanding of the [facility] Date Initiated: 09/28/2015 Goal Status: Active Interventions: Provide education on orientation to the wound center Notes: Franchino, Philip R. ([id number]) Pain, Acute or Chronic Nursing Diagnoses: Potential alteration in comfort, pain Goals: Patient will verbalize adequate pain control and receive pain control interventions during procedures as needed Date Initiated: 09/28/2015 Goal Status: Active Patient/caregiver will verbalize adequate pain control between visits Date Initiated: 09/28/2015 Goal Status:  Active Patient/caregiver will verbalize comfort level met Date Initiated: 09/28/2015 Goal Status: Active Interventions: Assess comfort goal upon admission Complete pain assessment as per visit requirements Encourage patient to take pain medications as prescribed Notes: Wound/Skin Impairment Nursing Diagnoses: Impaired tissue integrity Goals: Ulcer/skin breakdown will heal within 14 weeks Date Initiated: 09/28/2015 Goal Status: Active Interventions: Assess patient/caregiver ability to obtain necessary supplies Assess patient/caregiver ability to perform ulcer/skin care regimen upon admission and as needed Provide education on ulcer and skin care Notes: Electronic Signature(s) Signed: 11/02/2015 5:00:50 PM By: Gretta Cool, RN, BSN, Kim RN, BSN Entered By: Gretta Cool, RN, BSN, Kim on 11/02/2015 11:07:58 [name], [name] ([id number]) -------------------------------------------------------------------------------- Pain Assessment Details Patient Name: [name] Date of Service: 11/02/2015 10:45 AM Medical Record Number: [id number] Patient Account Number: [id number] Date of Birth/Sex: 02/03/1932 (82 y.o. Female) Treating RN: Cornell Barman Primary Care Physician: [name] Other Clinician: Referring Physician: Cranford Mon, Delfino Lovett Treating Physician/Extender: Frann Rider in Treatment: 5 Active Problems Location of Pain Severity and Description  of Pain Patient Has Paino Yes Site Locations Pain Location: Pain in Ulcers With Dressing Change: Yes Duration of the Pain. Constant / Intermittento Constant Rate the pain. Current Pain Level: 2 Worst Pain Level: 2 Least Pain Level: 2 Character of Pain Describe the Pain: Aching, Tender Pain Management and Medication Current Pain Management: Electronic Signature(s) Signed: 11/02/2015 5:00:50 PM By: Gretta Cool, RN, BSN, Kim RN, BSN Entered By: Gretta Cool, RN, BSN, Kim on 11/02/2015 10:57:40 [name], [name]  ([id number]) -------------------------------------------------------------------------------- Patient/Caregiver Education Details Patient Name: [name] Date of Service: 11/02/2015 10:45 AM Medical Record Number: [id number] Patient Account Number: [id number] Date of Birth/Gender: 11/14/1931 (82 y.o. Female) Treating RN: Cornell Barman Primary Care Physician: [name] Other Clinician: Referring Physician: Cranford Mon, Delfino Lovett Treating Physician/Extender: Frann Rider in Treatment: 5 Education Assessment Education Provided To: Patient Education Topics Provided Wound/Skin Impairment: Handouts: Caring for Your Ulcer Methods: Demonstration, Explain/Verbal Responses: State content correctly Electronic Signature(s) Signed: 11/02/2015 5:00:50 PM By: Gretta Cool, RN, BSN, Kim RN, BSN Entered By: Gretta Cool, RN, BSN, Kim on 11/02/2015 11:31:53 [name], [name] ([id number]) -------------------------------------------------------------------------------- Wound Assessment Details Patient Name: [name] Date of Service: 11/02/2015 10:45 AM Medical Record Number: [id number] Patient Account Number: [id number] Date of Birth/Sex: [birth year] (82 y.o. Female) Treating RN: Cornell Barman Primary Care Physician: [name] Other Clinician: Referring Physician: Cranford Mon, RICHARD Treating Physician/Extender: Frann Rider in Treatment: 5 Wound Status Wound Number: 3 Primary Trauma, Other Etiology: Wound Location: Right Lower Leg - Anterior Wound Open Wounding Event: Trauma Status: Date Acquired: 09/15/2015 Comorbid Cataracts, Hypertension, Colitis, Type Weeks Of Treatment: 5 History: II Diabetes, Osteoarthritis Clustered Wound: No Photos Wound Measurements Length: (cm) 3.5 Width: (cm) 4.5 Depth: (cm) 0.3 Area: (cm) 12.37 Volume: (cm) 3.711 % Reduction in Area: 78.1% % Reduction in Volume: 34.4% Epithelialization: None Wound Description Full Thickness  Without Foul Odor Af Classification: Exposed Support Structures Due to Produ Diabetic Severity Grade 1 (Wagner): Wound Margin: Distinct, outline attached Exudate Amount: Large Exudate Type: Serous Exudate Color: amber ter Cleansing: Yes ct Use: No Wound Bed Granulation Amount: Small (1-33%) Exposed Structure Granulation Quality: Red, Pink Fascia Exposed: No Jarboe, Arianis R. ([id number]) Necrotic Amount: Large (67-100%) Fat Layer Exposed: No Necrotic Quality: Adherent Slough Tendon Exposed: No Muscle Exposed: No Joint Exposed: No Bone Exposed: No Limited to Skin Breakdown Periwound Skin Texture Texture Color No Abnormalities Noted: No No Abnormalities Noted: Yes Callus: No Temperature / Pain Crepitus: No Temperature: No Abnormality Excoriation: No Tenderness on Palpation: Yes Fluctuance: No Friable: No Induration: No Localized Edema: Yes Rash: No Scarring: No Moisture No Abnormalities Noted: No Dry / Scaly: No Maceration: Yes Moist: No Wound Preparation Ulcer Cleansing: Rinsed/Irrigated with Saline Topical Anesthetic Applied: Other: lidocaine 4%, Treatment Notes Wound #3 (Right, Anterior Lower Leg) 1. Cleansed with: Clean wound with Normal Saline 2. Anesthetic Topical Lidocaine 4% cream to wound bed prior to debridement 3. Peri-wound Care: Barrier cream 4. Dressing Applied: Santyl Ointment 5. Secondary Dressing Applied Non-Adherent pad ABD and Kerlix/Conform 7. Secured with Tape Notes stretch net [name], [name] ([id number]) Electronic Signature(s) Signed: 11/02/2015 5:00:50 PM By: Gretta Cool, RN, BSN, Kim RN, BSN Entered By: Gretta Cool, RN, BSN, Kim on 11/02/2015 11:04:42 [name], [name] ([id number]) -------------------------------------------------------------------------------- [place] Details Patient Name: [name] Date of Service: 11/02/2015 10:45 AM Medical Record Number: [id number] Patient Account Number: [id number] Date of Birth/Sex:  [birth year] (82 y.o. Female) Treating RN: Cornell Barman Primary Care Physician: [name] Other Clinician: Referring Physician: Cranford Mon, RICHARD Treating Physician/Extender: Christin Fudge  Weeks in Treatment: 5 Vital Signs Time Taken: 10:57 Temperature (F): 97.7 Height (in): 61 Pulse (bpm): 61 Weight (lbs): 170 Respiratory Rate (breaths/min): 18 Body Mass Index (BMI): 32.1 Blood Pressure (mmHg): 137/56 Reference Range: 80 - 120 mg / dl Electronic Signature(s) Signed: 11/02/2015 5:00:50 PM By: Gretta Cool, RN, BSN, Kim RN, BSN Entered By: Gretta Cool, RN, BSN, Kim on 11/02/2015 10:58:15

## 2015-11-07 ENCOUNTER — Other Ambulatory Visit: Payer: Self-pay | Admitting: Family Medicine

## 2015-11-07 DIAGNOSIS — R609 Edema, unspecified: Secondary | ICD-10-CM

## 2015-11-09 ENCOUNTER — Encounter: Payer: Medicare Other | Attending: Surgery | Admitting: Surgery

## 2015-11-09 DIAGNOSIS — M1990 Unspecified osteoarthritis, unspecified site: Secondary | ICD-10-CM | POA: Insufficient documentation

## 2015-11-09 DIAGNOSIS — E559 Vitamin D deficiency, unspecified: Secondary | ICD-10-CM | POA: Insufficient documentation

## 2015-11-09 DIAGNOSIS — I890 Lymphedema, not elsewhere classified: Secondary | ICD-10-CM | POA: Diagnosis not present

## 2015-11-09 DIAGNOSIS — S81811A Laceration without foreign body, right lower leg, initial encounter: Secondary | ICD-10-CM | POA: Insufficient documentation

## 2015-11-09 DIAGNOSIS — L97212 Non-pressure chronic ulcer of right calf with fat layer exposed: Secondary | ICD-10-CM | POA: Insufficient documentation

## 2015-11-09 DIAGNOSIS — Z79899 Other long term (current) drug therapy: Secondary | ICD-10-CM | POA: Diagnosis not present

## 2015-11-09 DIAGNOSIS — E11622 Type 2 diabetes mellitus with other skin ulcer: Secondary | ICD-10-CM | POA: Diagnosis not present

## 2015-11-09 DIAGNOSIS — I87311 Chronic venous hypertension (idiopathic) with ulcer of right lower extremity: Secondary | ICD-10-CM | POA: Diagnosis not present

## 2015-11-09 DIAGNOSIS — Z87891 Personal history of nicotine dependence: Secondary | ICD-10-CM | POA: Diagnosis not present

## 2015-11-09 DIAGNOSIS — I10 Essential (primary) hypertension: Secondary | ICD-10-CM | POA: Insufficient documentation

## 2015-11-09 DIAGNOSIS — X58XXXA Exposure to other specified factors, initial encounter: Secondary | ICD-10-CM | POA: Diagnosis not present

## 2015-11-13 ENCOUNTER — Ambulatory Visit (INDEPENDENT_AMBULATORY_CARE_PROVIDER_SITE_OTHER): Payer: Medicare Other | Admitting: Family Medicine

## 2015-11-13 ENCOUNTER — Encounter: Payer: Self-pay | Admitting: Family Medicine

## 2015-11-13 VITALS — BP 110/52 | HR 86 | Temp 97.7°F | Resp 14 | Wt 172.6 lb

## 2015-11-13 DIAGNOSIS — R55 Syncope and collapse: Secondary | ICD-10-CM

## 2015-11-13 DIAGNOSIS — E860 Dehydration: Secondary | ICD-10-CM

## 2015-11-13 DIAGNOSIS — N179 Acute kidney failure, unspecified: Secondary | ICD-10-CM | POA: Diagnosis not present

## 2015-11-13 DIAGNOSIS — N390 Urinary tract infection, site not specified: Secondary | ICD-10-CM

## 2015-11-13 NOTE — Progress Notes (Signed)
[name], [name] ([id number]) Visit Report for 11/09/2015 Arrival Information Details Patient Name: [name], [name] Date of Service: 11/09/2015 10:45 AM Medical Record Number: [id number] Patient Account Number: [id number] Date of Birth/Sex: October 20, 1931 (82 y.o. Female) Treating RN: Carolyne Fiscal, Debi Primary Care Physician: [name] Other Clinician: Referring Physician: Cranford Mon, Delfino Lovett Treating Physician/Extender: Frann Rider in Treatment: 6 Visit Information History Since Last Visit All ordered tests and consults were completed: No Patient Arrived: [name] Added or deleted any medications: No Arrival Time: 11:01 Any new allergies or adverse reactions: No Accompanied By: self Had a fall or experienced change in No Transfer Assistance: None activities of daily living that may affect Patient Identification Verified: Yes risk of falls: Secondary Verification Process Yes Signs or symptoms of abuse/neglect since last No Completed: visito Patient Requires Transmission- No Hospitalized since last visit: No Based Precautions: Pain Present Now: No Patient Has Alerts: Yes Patient Alerts: Patient on Blood Thinner aspirin BMI 1.04 to rt lower leg Electronic Signature(s) Signed: 11/13/2015 4:41:03 PM By: Alric Quan Entered By: Alric Quan on 11/09/2015 11:01:27 [name] ([id number]) -------------------------------------------------------------------------------- Encounter Discharge Information Details Patient Name: [name] Date of Service: 11/09/2015 10:45 AM Medical Record Number: [id number] Patient Account Number: [id number] Date of Birth/Sex: 1931-11-17 (82 y.o. Female) Treating RN: Carolyne Fiscal, Debi Primary Care Physician: [name] Other Clinician: Referring Physician: Cranford Mon, RICHARD Treating Physician/Extender: Frann Rider in Treatment: 6 Encounter Discharge Information Items Discharge Pain Level: 0 Discharge  Condition: Stable Ambulatory Status: Cane Discharge Destination: Home Transportation: Private Auto Accompanied By: self Schedule Follow-up Appointment: Yes Medication Reconciliation completed Yes and provided to Patient/Care Nirvaan Frett: Provided on Clinical Summary of Care: 11/09/2015 Form Type Recipient Paper Patient [facility] Electronic Signature(s) Signed: 11/13/2015 4:41:03 PM By: Alric Quan Previous Signature: 11/09/2015 11:28:15 AM Version By: Ruthine Dose Entered By: Alric Quan on 11/09/2015 11:30:09 [name] ([id number]) -------------------------------------------------------------------------------- Lower Extremity Assessment Details Patient Name: [name] Date of Service: 11/09/2015 10:45 AM Medical Record Number: [id number] Patient Account Number: [id number] Date of Birth/Sex: 03-27-1931 (82 y.o. Female) Treating RN: Carolyne Fiscal, Debi Primary Care Physician: [name] Other Clinician: Referring Physician: Cranford Mon, RICHARD Treating Physician/Extender: Frann Rider in Treatment: 6 Vascular Assessment Pulses: Posterior Tibial Dorsalis Pedis Palpable: [Right:Yes] Extremity colors, hair growth, and conditions: Extremity Color: [Right:Normal] Temperature of Extremity: [Right:Warm] Capillary Refill: [Right:< 3 seconds] Toe Nail Assessment Left: Right: Thick: No Discolored: No Deformed: No Improper Length and Hygiene: No Electronic Signature(s) Signed: 11/13/2015 4:41:03 PM By: Alric Quan Entered By: Alric Quan on 11/09/2015 11:02:12 Mcglown, Clemens Catholic ([id number]) -------------------------------------------------------------------------------- Multi Wound Chart Details Patient Name: [name] Date of Service: 11/09/2015 10:45 AM Medical Record Number: [id number] Patient Account Number: [id number] Date of Birth/Sex: [birth year] (82 y.o. 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] Secondary Etiology: [3:Venous Leg Ulcer] [N/A:N/A] Comorbid History: [3:Cataracts, Hypertension, N/A Colitis, Type II Diabetes, Osteoarthritis] Date Acquired: [3:09/15/2015] [N/A:N/A] Weeks of Treatment: [3:6] [N/A:N/A] Wound Status: [3:Open] [N/A:N/A] Measurements L x W x D 3.5x4.3x0.3 [N/A:N/A] (cm) Area (cm) : [3:11.82] [N/A:N/A] Volume (cm) : [3:3.546] [N/A:N/A] % Reduction in Area: [3:79.10%] [N/A:N/A] % Reduction in  Volume: 37.30% [N/A:N/A] Classification: [3:Grade 1] [N/A:N/A] Exudate Amount: [3:Large] [N/A:N/A] Exudate Type: [3:Serous] [N/A:N/A] Exudate Color: [3:amber] [N/A:N/A] Foul Odor After [3:Yes] [N/A:N/A] Cleansing: Odor Anticipated Due to No [N/A:N/A] Product Use: Wound Margin: [3:Distinct, outline attached N/A] Granulation Amount: [3:Small (1-33%)] [N/A:N/A] Granulation Quality: [3:Red, Pink] [N/A:N/A] Necrotic Amount: [3:Large (67-100%)] [N/A:N/A] Exposed Structures: Fascia: No N/A N/A Fat: No Tendon: No Muscle: No Joint: No Bone: No Limited to Skin Breakdown Epithelialization: None N/A N/A Periwound Skin Texture: Edema: Yes N/A N/A Excoriation: No Induration: No Callus: No Crepitus: No Fluctuance: No Friable: No Rash: No Scarring: No Periwound Skin Maceration: Yes N/A N/A Moisture: Moist: No Dry/Scaly: No Periwound Skin Color: No Abnormalities Noted N/A N/A Temperature: No Abnormality N/A N/A Tenderness on Yes N/A N/A Palpation: Wound  Preparation: Ulcer Cleansing: N/A N/A Rinsed/Irrigated with Saline Topical Anesthetic Applied: Other: lidocaine 4% Treatment Notes Electronic Signature(s) Signed: 11/09/2015 4:27:47 PM By: Curtis Sites Entered By: Curtis Sites on 11/09/2015 11:18:44 YOLANDO, GILLUM ([id number]) -------------------------------------------------------------------------------- Multi-Disciplinary Care Plan Details Patient Name: Landis Gandy Date of Service: 11/09/2015 10:45 AM Medical Record Number: [id number] Patient Account Number: [id number] Date of Birth/Sex: [birth year] (82 y.o. Female) Treating RN: Curtis Sites Primary Care Physician: Dortha Kern Other Clinician: Referring Physician: Megan Mans Treating Physician/Extender: Rudene Re in Treatment: 6 Active Inactive Abuse / Safety / Falls / Self Care Management Nursing Diagnoses: Potential for falls Goals: Patient/caregiver will verbalize understanding of skin care regimen Date Initiated: 09/28/2015 Goal Status: Active Patient/caregiver will verbalize/demonstrate measures taken to prevent injury and/or falls Date Initiated: 09/28/2015 Goal Status: Active Interventions: Assess fall risk on admission and as needed Assess: immobility, friction, shearing, incontinence upon admission and as needed Assess impairment of mobility on admission and as needed per policy Assess self care needs on admission and as needed Provide education on fall prevention Notes: Orientation to the Wound Care Program Nursing Diagnoses: Knowledge deficit related to the wound healing center program Goals: Patient/caregiver will verbalize understanding of the Wound Healing Center Program Date Initiated: 09/28/2015 Goal Status: Active Interventions: Provide education on orientation to the wound center Notes: Loren, Julienne R. ([id number]) Pain, Acute or Chronic Nursing Diagnoses: Potential alteration in comfort, pain Goals: Patient  will verbalize adequate pain control and receive pain control interventions during procedures as needed Date Initiated: 09/28/2015 Goal Status: Active Patient/caregiver will verbalize adequate pain control between visits Date Initiated: 09/28/2015 Goal Status: Active Patient/caregiver will verbalize comfort level met Date Initiated: 09/28/2015 Goal Status: Active Interventions: Assess comfort goal upon admission Complete pain assessment as per visit requirements Encourage patient to take pain medications as prescribed Notes: Wound/Skin Impairment Nursing Diagnoses: Impaired tissue integrity Goals: Ulcer/skin breakdown will heal within 14 weeks Date Initiated: 09/28/2015 Goal Status: Active Interventions: Assess patient/caregiver ability to obtain necessary supplies Assess patient/caregiver ability to perform ulcer/skin care regimen upon admission and as needed Provide education on ulcer and skin care Notes: Electronic Signature(s) Signed: 11/09/2015 4:27:47 PM By: Curtis Sites Entered By: Curtis Sites on 11/09/2015 11:18:25 Styles, Ann Held ([id number]) -------------------------------------------------------------------------------- Pain Assessment Details Patient Name: Landis Gandy Date of Service: 11/09/2015 10:45 AM Medical Record Number: [id number] Patient Account Number: [id number] Date of Birth/Sex: 20-Dec-1931 (82 y.o. Female) Treating RN: Ashok Cordia, Debi Primary Care Physician: Dortha Kern Other Clinician: Referring Physician: Megan Mans Treating Physician/Extender: Rudene Re in Treatment: 6 Active Problems Location of Pain Severity and Description of Pain Patient Has Paino No Site Locations With Dressing Change: No Pain Management and Medication Current Pain Management: Electronic Signature(s) Signed:  11/13/2015 4:41:03 PM By: Alric Quan Entered By: Alric Quan on 11/09/2015 11:01:33 [name]  ([id number]) -------------------------------------------------------------------------------- Patient/Caregiver Education Details Patient Name: [name] Date of Service: 11/09/2015 10:45 AM Medical Record Number: [id number] Patient Account Number: [id number] Date of Birth/Gender: 15-Mar-1931 (82 y.o. Female) Treating RN: Carolyne Fiscal, Debi Primary Care Physician: [name] Other Clinician: Referring Physician: Cranford Mon, RICHARD Treating Physician/Extender: Frann Rider in Treatment: 6 Education Assessment Education Provided To: Patient Education Topics Provided Wound/Skin Impairment: Handouts: Other: change dressings as directed Methods: Demonstration, Explain/Verbal Responses: State content correctly Electronic Signature(s) Signed: 11/13/2015 4:41:03 PM By: Alric Quan Entered By: Alric Quan on 11/09/2015 11:30:24 Mayr, Clemens Catholic ([id number]) -------------------------------------------------------------------------------- Wound Assessment Details Patient Name: [name] Date of Service: 11/09/2015 10:45 AM Medical Record Number: [id number] Patient Account Number: [id number] Date of Birth/Sex: October 28, 1931 (82 y.o. Female) Treating RN: Carolyne Fiscal, Debi Primary Care Physician: [name] Other Clinician: Referring Physician: Cranford Mon, RICHARD Treating Physician/Extender: Frann Rider in Treatment: 6 Wound Status Wound Number: 3 Primary Diabetic Wound/Ulcer of the Lower Etiology: Extremity Wound Location: Right Lower Leg - Anterior Secondary Venous Leg Ulcer Wounding Event: Trauma Etiology: Date Acquired: 09/15/2015 Wound Status: Open Weeks Of Treatment: 6 Comorbid Cataracts, Hypertension, Colitis, Clustered Wound: No History: Type II Diabetes, Osteoarthritis Photos Photo Uploaded By: Alric Quan on 11/09/2015 11:39:08 Wound Measurements Length: (cm) 3.5 Width: (cm) 4.3 Depth: (cm) 0.3 Area: (cm) 11.82 Volume:  (cm) 3.546 % Reduction in Area: 79.1% % Reduction in Volume: 37.3% Epithelialization: None Tunneling: No Undermining: No Wound Description Classification: Grade 1 Foul Odor Af Wound Margin: Distinct, outline attached Due to Produ Exudate Amount: Large Exudate Type: Serous Exudate Color: amber ter Cleansing: Yes ct Use: No Wound Bed Granulation Amount: Small (1-33%) Exposed Structure Granulation Quality: Red, Pink Fascia Exposed: No Necrotic Amount: Large (67-100%) Fat Layer Exposed: No Necrotic Quality: Adherent Slough Tendon Exposed: No Pratt, Meeah R. ([id number]) Muscle Exposed: No Joint Exposed: No Bone Exposed: No Limited to Skin Breakdown Periwound Skin Texture Texture Color No Abnormalities Noted: No No Abnormalities Noted: Yes Callus: No Temperature / Pain Crepitus: No Temperature: No Abnormality Excoriation: No Tenderness on Palpation: Yes Fluctuance: No Friable: No Induration: No Localized Edema: Yes Rash: No Scarring: No Moisture No Abnormalities Noted: No Dry / Scaly: No Maceration: Yes Moist: No Wound Preparation Ulcer Cleansing: Rinsed/Irrigated with Saline Topical Anesthetic Applied: Other: lidocaine 4%, Treatment Notes Wound #3 (Right, Anterior Lower Leg) 1. Cleansed with: Clean wound with Normal Saline 2. Anesthetic Topical Lidocaine 4% cream to wound bed prior to debridement 4. Dressing Applied: Other dressing (specify in notes) 5. Secondary Dressing Applied ABD Pad Kerlix/Conform 7. Secured with Tape Notes RTD Electronic Signature(s) Signed: 11/13/2015 4:41:03 PM By: Alric Quan Entered By: Alric Quan on 11/09/2015 11:09:05 JANELLI, WELLING ([id number]) ARPITA, FENTRESS ([id number]) -------------------------------------------------------------------------------- [place] Details Patient Name: [name] Date of Service: 11/09/2015 10:45 AM Medical Record Number: [id number] Patient Account Number:  [id number] Date of Birth/Sex: [birth year] (82 y.o. Female) Treating RN: Carolyne Fiscal, Debi Primary Care Physician: [name] Other Clinician: Referring Physician: Cranford Mon, RICHARD Treating Physician/Extender: Frann Rider in Treatment: 6 Vital Signs Time Taken: 11:01 Temperature (F): 97.5 Height (in): 61 Pulse (bpm): 69 Weight (lbs): 170 Respiratory Rate (breaths/min): 18 Body Mass Index (BMI): 32.1 Blood Pressure (mmHg): 109/77 Reference Range: 80 - 120 mg / dl Electronic Signature(s) Signed: 11/13/2015 4:41:03 PM By: Alric Quan Entered By: Alric Quan on 11/09/2015 11:01:54

## 2015-11-13 NOTE — Progress Notes (Signed)
MARYGRACE, SOOS ([id number]) Visit Report for 11/09/2015 Chief Complaint Document Details Patient Name: [name], [name] Date of Service: 11/09/2015 10:45 AM Medical Record Number: [id number] Patient Account Number: [id number] Date of Birth/Sex: [birth year] (82 y.o. Female) Treating RN: Carolyne Fiscal, Debi Primary Care Physician: [name] Other Clinician: Referring Physician: Wilhemena Durie Treating Physician/Extender: Frann Rider in Treatment: 6 Information Obtained from: Patient Chief Complaint Patient presents to the wound care center for a consult due non healing wound to the right lower extremity which she's had for about 3 weeks now Electronic Signature(s) Signed: 11/09/2015 11:26:29 AM By: Christin Fudge MD, FACS Entered By: Christin Fudge on 11/09/2015 11:26:29 [name] ([id number]) -------------------------------------------------------------------------------- Debridement Details Patient Name: [name] Date of Service: 11/09/2015 10:45 AM Medical Record Number: [id number] Patient Account Number: [id number] Date of Birth/Sex: 18-Jul-1931 (82 y.o. Female) Treating RN: Carolyne Fiscal, Debi Primary Care Physician: [name] Other Clinician: Referring Physician: Wilhemena Durie Treating Physician/Extender: Frann Rider in Treatment: 6 Debridement Performed for Wound #3 Right,Anterior Lower Leg Assessment: Performed By: Physician Christin Fudge, MD Debridement: Debridement Pre-procedure Yes - 11:20 Verification/Time Out Taken: Start Time: 11:20 Pain Control: Lidocaine 4% Topical Solution Level: Skin/Subcutaneous Tissue Total Area Debrided (L x 3.5 (cm) x 4 (cm) = 14 (cm) W): Tissue and other Viable, Non-Viable, Exudate, Fibrin/Slough, Subcutaneous material debrided: Instrument: Curette Bleeding: Minimum Hemostasis Achieved: Pressure End Time: 11:23 Procedural Pain: 0 Post Procedural Pain: 0 Response to Treatment: Procedure  was tolerated well Post Debridement Measurements of Total Wound Length: (cm) 3.5 Width: (cm) 4.3 Depth: (cm) 0.3 Volume: (cm) 3.546 Character of Wound/Ulcer Post Improved Debridement: Severity of Tissue Post Debridement: Fat layer exposed Post Procedure Diagnosis Same as Pre-procedure Electronic Signature(s) Signed: 11/09/2015 11:26:21 AM By: Christin Fudge MD, FACS Signed: 11/13/2015 4:41:03 PM By: Alric Quan Entered By: Christin Fudge on 11/09/2015 11:26:21 JAZLYNNE, WININGS ([id number]) DONNESHIA, SALSER ([id number]) -------------------------------------------------------------------------------- HPI Details Patient Name: [name] Date of Service: 11/09/2015 10:45 AM Medical Record Number: [id number] Patient Account Number: [id number] Date of Birth/Sex: June 14, 1931 (82 y.o. Female) Treating RN: Carolyne Fiscal, Debi Primary Care Physician: [name] Other Clinician: Referring Physician: Wilhemena Durie Treating Physician/Extender: Frann Rider in Treatment: 6 History of Present Illness Location: lacerated wound to the right lower extremity Quality: Patient reports experiencing a dull pain to affected area(s). Severity: Patient states wound (s) are getting better. Duration: Patient has had the wound for < 3 weeks prior to presenting for treatment Timing: Pain in wound is Intermittent (comes and goes Context: The wound occurred when the patient had a blunt injury which caused a laceration to the right lower extremity Modifying Factors: Other treatment(s) tried include:was seen in the ER on 09/15/2015 and sutured with 4-0 nylon sutures. Associated Signs and Symptoms: Patient reports having increase swelling. HPI Description: 82 year old patient who had an injury to her right lower extremity on July 8 while she was in [place] boarding a plane. She was treated at [facility] with sutures being placed on 09/15/2015. She was recently seen last week by her PCP  edema of the feet and legs and after review as advised to take Lasix 20 mg daily. past hemoglobin A1c done in June 2017 was 9.1% Her past medical history is significant for diabetes mellitus, arthritis, basal cell carcinoma, collagenous colitis, essential hypertension, avitaminosis D. She is also status post abdominal hysterectomy, lumbar discectomy, neck surgery. She is a former smoker and quit in 1979. Of note in the recent past she was seen by  Dr. Lucky Cowboy for what seems a sounds like injections of varicose veins. This was only around her ankles and not in the lower extremities. No Doppler studies were done. She was not advised to wear any compression stockings. 11/02/2015 -- was admitted to the hospital between 8/18 and 10/28/2015 for a syncopal attack and was found to have acute renal failure, UTI and her x-rays checked out okay. She was evaluated and discharged home on Keflex which she is completing today. Electronic Signature(s) Signed: 11/09/2015 11:26:33 AM By: Christin Fudge MD, FACS Entered By: Christin Fudge on 11/09/2015 11:26:33 [name], [name] ([id number]) -------------------------------------------------------------------------------- Physical Exam Details Patient Name: [name] Date of Service: 11/09/2015 10:45 AM Medical Record Number: [id number] Patient Account Number: [id number] Date of Birth/Sex: 1931-12-05 (82 y.o. Female) Treating RN: Carolyne Fiscal, Debi Primary Care Physician: [name] Other Clinician: Referring Physician: Cranford Mon, RICHARD Treating Physician/Extender: Frann Rider in Treatment: 6 Constitutional . Pulse regular. Respirations normal and unlabored. Afebrile. . Eyes Nonicteric. Reactive to light. Ears, Nose, Mouth, and Throat Lips, teeth, and gums WNL.Marland Kitchen Moist mucosa without lesions. Neck supple and nontender. No palpable supraclavicular or cervical adenopathy. Normal sized without goiter. Respiratory WNL. No  retractions.. Cardiovascular Pedal Pulses WNL. No clubbing, cyanosis or edema. Lymphatic No adneopathy. No adenopathy. No adenopathy. Musculoskeletal Adexa without tenderness or enlargement.. Digits and nails w/o clubbing, cyanosis, infection, petechiae, ischemia, or inflammatory conditions.. Integumentary (Hair, Skin) No suspicious lesions. No crepitus or fluctuance. No peri-wound warmth or erythema. No masses.Marland Kitchen Psychiatric Judgement and insight Intact.. No evidence of depression, anxiety, or agitation.. Notes wound continues to have subcutaneous debris which are sharply removed with a #3 curet and minimal bleeding controlled with pressure. Once it was clamped out there is healthy granulation tissue at the base and there is in fact some hyper granulation. Electronic Signature(s) Signed: 11/09/2015 11:27:08 AM By: Christin Fudge MD, FACS Entered By: Christin Fudge on 11/09/2015 11:27:07 [name] ([id number]) -------------------------------------------------------------------------------- Physician Orders Details Patient Name: [name] Date of Service: 11/09/2015 10:45 AM Medical Record Number: [id number] Patient Account Number: [id number] Date of Birth/Sex: Jul 28, 1931 (82 y.o. Female) Treating RN: Montey Hora Primary Care Physician: [name] Other Clinician: Referring Physician: Cranford Mon, RICHARD Treating Physician/Extender: Frann Rider in Treatment: 6 Verbal / Phone Orders: Yes Clinician: Montey Hora Read Back and Verified: Yes Diagnosis Coding Wound Cleansing Wound #3 Right,Anterior Lower Leg o Clean wound with Normal Saline. o Cleanse wound with mild soap and water o May Shower, gently pat wound dry prior to applying new dressing. Anesthetic Wound #3 Right,Anterior Lower Leg o Topical Lidocaine 4% cream applied to wound bed prior to debridement - for clinic use Primary Wound Dressing Wound #3 Right,Anterior Lower Leg o  Hydrafera Blue Secondary Dressing Wound #3 Right,Anterior Lower Leg o ABD pad o Conform/Kerlix - conform netting and tape o Non-adherent pad Dressing Change Frequency Wound #3 Right,Anterior Lower Leg o Change dressing every other day. Follow-up Appointments Wound #3 Right,Anterior Lower Leg o Return Appointment in 1 week. Additional Orders / Instructions Wound #3 Right,Anterior Lower Leg o Increase protein intake. Medications-please add to medication list. Wound #3 Right,Anterior Lower Leg Sessa, Julionna R. ([id number]) o Other: - Vitamin A, Vitamin C, Zinc, Multivitamin Electronic Signature(s) Signed: 11/09/2015 3:52:23 PM By: Christin Fudge MD, FACS Signed: 11/09/2015 4:27:47 PM By: Montey Hora Entered By: Montey Hora on 11/09/2015 11:24:07 DALA, NOLTON ([id number]) -------------------------------------------------------------------------------- Problem List Details Patient Name: [name] Date of Service: 11/09/2015 10:45 AM Medical Record Number: [id number] Patient  Account Number: [id number] Date of Birth/Sex: 26-Dec-1931 (82 y.o. 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 I87.311 Chronic venous hypertension (idiopathic) with ulcer of 11/09/2015 Yes right lower extremity I89.0 Lymphedema, not elsewhere classified 11/09/2015 Yes Inactive Problems Resolved Problems Electronic Signature(s) Signed: 11/09/2015 11:31:29 AM By: Christin Fudge MD, FACS Previous Signature: 11/09/2015 11:24:47 AM Version By: Christin Fudge MD, FACS Entered By: Christin Fudge on 11/09/2015 11:31:28 [name] ([id number]) -------------------------------------------------------------------------------- Progress Note Details Patient Name: [name] Date of Service: 11/09/2015 10:45 AM Medical Record Number: [id number] Patient Account Number: [id number] Date of Birth/Sex: 1931/09/14 (82 y.o. Female) Treating RN: Carolyne Fiscal, Debi Primary Care Physician: [name] Other Clinician: Referring Physician: Wilhemena Durie Treating Physician/Extender: Frann Rider in Treatment: 6 Subjective Chief Complaint Information obtained from Patient Patient presents to the wound care center for a consult due non healing wound to the right lower extremity which she's had for about 3 weeks now History of Present Illness (HPI) The following HPI elements were documented for the patient's wound: Location: lacerated wound to the right lower extremity Quality: Patient reports experiencing a dull pain to affected area(s). Severity: Patient states wound (s) are getting better. Duration: Patient has had the wound for < 3 weeks prior to presenting for treatment Timing: Pain in wound is Intermittent (comes and goes Context: The wound occurred when the patient had a blunt injury which caused a laceration to the right lower extremity Modifying Factors: Other treatment(s) tried include:was seen in the ER on 09/15/2015 and sutured with 4-0 nylon sutures. Associated Signs and Symptoms: Patient reports having increase swelling. 82 year old patient who had an injury to her right lower extremity on July 8 while she was in [place] boarding a plane. She was treated at [facility] with sutures being placed on 09/15/2015. She was recently seen last week by her PCP edema of the feet and legs and after review as advised to take Lasix 20 mg daily. past hemoglobin A1c done in June 2017 was 9.1% Her past medical history is significant  for diabetes mellitus, arthritis, basal cell carcinoma, collagenous colitis, essential hypertension, avitaminosis D. She is also status post abdominal hysterectomy, lumbar discectomy, neck surgery. She is a former smoker and quit in 1979. Of note in the recent past she was seen by Dr. Lucky Cowboy for what seems a sounds like injections of varicose veins. This was only around her ankles and not in the lower extremities. No Doppler studies were done. She was not advised to wear any compression stockings. 11/02/2015 -- was admitted to the hospital between 8/18 and 10/28/2015 for a syncopal attack and was found to have acute renal failure, UTI and her x-rays checked out okay. She was evaluated and discharged home on Keflex which she is completing today. Kantz, Alizay R. ([id number]) Objective Constitutional Pulse regular. Respirations normal and unlabored. Afebrile. Vitals Time Taken: 11:01 AM, Height: 61 in, Weight: 170 lbs, BMI: 32.1, Temperature: 97.5 F, Pulse: 69 bpm, Respiratory Rate: 18 breaths/min, Blood Pressure: 109/77 mmHg. Eyes Nonicteric. Reactive to light. Ears, Nose, Mouth, and Throat Lips, teeth, and gums WNL.Marland Kitchen Moist mucosa without lesions. Neck supple and nontender. No palpable supraclavicular or cervical  adenopathy. Normal sized without goiter. Respiratory WNL. No retractions.. Cardiovascular Pedal Pulses WNL. No clubbing, cyanosis or edema. Lymphatic No adneopathy. No adenopathy. No adenopathy. Musculoskeletal Adexa without tenderness or enlargement.. Digits and nails w/o clubbing, cyanosis, infection, petechiae, ischemia, or inflammatory conditions.Marland Kitchen Psychiatric Judgement and insight Intact.. No evidence of depression, anxiety, or agitation.. General Notes: wound continues to have subcutaneous debris which are sharply removed with a #3 curet and minimal bleeding controlled with pressure. Once it was clamped out there is healthy granulation tissue at the base and there is  in fact some hyper granulation. Integumentary (Hair, Skin) No suspicious lesions. No crepitus or fluctuance. No peri-wound warmth or erythema. No masses.. Wound #3 status is Open. Original cause of wound was Trauma. The wound is located on the Right,Anterior Lower Leg. The wound measures 3.5cm length x 4.3cm width x 0.3cm depth; 11.82cm^2 area and 3.546cm^3 volume. The wound is limited to skin breakdown. There is no tunneling or undermining noted. There is a large amount of serous drainage noted. The wound margin is distinct with the outline attached to the wound base. There is small (1-33%) red, pink granulation within the wound bed. There is a Masullo, Peighton R. ([id number]) large (67-100%) amount of necrotic tissue within the wound bed including Adherent Slough. The periwound skin appearance had no abnormalities noted for color. The periwound skin appearance exhibited: Localized Edema, Maceration. The periwound skin appearance did not exhibit: Callus, Crepitus, Excoriation, Fluctuance, Friable, Induration, Rash, Scarring, Dry/Scaly, Moist. Periwound temperature was noted as No Abnormality. The periwound has tenderness on palpation. Assessment Active Problems ICD-10 E11.622 - Type 2 diabetes mellitus with other skin ulcer S81.811A - Laceration without foreign body, right lower leg, initial encounter L97.212 - Non-pressure chronic ulcer of right calf with fat layer exposed I87.311 - Chronic venous hypertension (idiopathic) with ulcer of right lower extremity I89.0 - Lymphedema, not elsewhere classified Procedures Wound #3 Wound #3 is a Diabetic Wound/Ulcer of the Lower Extremity located on the Right,Anterior Lower Leg . There was a Skin/Subcutaneous Tissue Debridement [id number]) debridement with total area of 14 sq cm performed by Christin Fudge, MD. with the following instrument(s): Curette to remove Viable and Non- Viable tissue/material including Exudate, Fibrin/Slough, and  Subcutaneous after achieving pain control using Lidocaine 4% Topical Solution. A time out was conducted at 11:20, prior to the start of the procedure. A Minimum amount of bleeding was controlled with Pressure. The procedure was tolerated well with a pain level of 0 throughout and a pain level of 0 following the procedure. Post Debridement Measurements: 3.5cm length x 4.3cm width x 0.3cm depth; 3.546cm^3 volume. Character of Wound/Ulcer Post Debridement is improved. Severity of Tissue Post Debridement is: Fat layer exposed. Post procedure Diagnosis Wound #3: Same as Pre-Procedure Plan Wound Cleansing: Wound #3 Right,Anterior Lower Leg: Plog, Acquanetta R. ([id number]) Clean wound with Normal Saline. Cleanse wound with mild soap and water May Shower, gently pat wound dry prior to applying new dressing. Anesthetic: Wound #3 Right,Anterior Lower Leg: Topical Lidocaine 4% cream applied to wound bed prior to debridement - for clinic use Primary Wound Dressing: Wound #3 Right,Anterior Lower Leg: Hydrafera Blue Secondary Dressing: Wound #3 Right,Anterior Lower Leg: ABD pad Conform/Kerlix - conform netting and tape Non-adherent pad Dressing Change Frequency: Wound #3 Right,Anterior Lower Leg: Change dressing every other day. Follow-up Appointments: Wound #3 Right,Anterior Lower Leg: Return Appointment in 1 week. Additional Orders / Instructions: Wound #3 Right,Anterior Lower Leg: Increase protein intake. Medications-please add to medication list.: Wound #3 Right,Anterior Lower Leg:  Other: - Vitamin A, Vitamin C, Zinc, Multivitamin I have recommended: 1. Washing the wound daily with soap and water and applying RTD and a gauze and Kerlix dressing over this, on alternate days 2. Elevation of the limbs as much as possible. 3. control of her diabetes mellitus 4. Protein supplements, vitamin A, vitamin C and zinc 5. Regular visits to the wound care center. Electronic Signature(s) Signed:  11/09/2015 11:32:09 AM By: Christin Fudge MD, FACS Previous Signature: 11/09/2015 11:28:42 AM Version By: Christin Fudge MD, FACS Entered By: Christin Fudge on 11/09/2015 11:32:08 CHALISE, RYSKAMP ([id number]) -------------------------------------------------------------------------------- SuperBill Details Patient Name: [name] Date of Service: 11/09/2015 Medical Record Number: [id number] Patient Account Number: [id number] Date of Birth/Sex: 1932/01/20 (82 y.o. Female) Treating RN: Carolyne Fiscal, Debi Primary Care Physician: [name] Other Clinician: Referring Physician: Cranford Mon, Delfino Lovett Treating Physician/Extender: Frann Rider in Treatment: 6 Diagnosis Coding ICD-10 Codes Code Description E11.622 Type 2 diabetes mellitus with other skin ulcer S81.811A Laceration without foreign body, right lower leg, initial encounter L97.212 Non-pressure chronic ulcer of right calf with fat layer exposed I87.311 Chronic venous hypertension (idiopathic) with ulcer of right lower extremity Facility Procedures CPT4 Code Description: [id number] - DEB SUBQ TISSUE 20 SQ CM/< ICD-10 Description Diagnosis E11.622 Type 2 diabetes mellitus with other skin ulcer S81.811A Laceration without foreign body, right lower leg, initi L97.212 Non-pressure chronic ulcer of  right calf with fat layer I87.311 Chronic venous hypertension (idiopathic) with ulcer of Modifier: al encounte exposed right lower Quantity: 1 r extremity Physician Procedures CPT4 Code Description: [id number] - WC PHYS SUBQ TISS 20 SQ CM ICD-10 Description Diagnosis E11.622 Type 2 diabetes mellitus with other skin ulcer S81.811A Laceration without foreign body, right lower leg, initi L97.212 Non-pressure chronic ulcer of  right calf with fat layer I87.311 Chronic venous hypertension (idiopathic) with ulcer of Modifier: al encounte exposed right lower Quantity: 1 r extremity Electronic Signature(s) Signed: 11/09/2015 11:29:04  AM By: Christin Fudge MD, FACS Entered By: Christin Fudge on 11/09/2015 11:29:04

## 2015-11-13 NOTE — Progress Notes (Signed)
[name]    DOB: Apr 20, 1931   82 y.o.   MRN: [id number] Visit Date: 11/13/2015  Today's Provider: [name], PA   Chief Complaint  Patient presents with  . Hospitalization Follow-up   Subjective:    HPI  Follow up Hospitalization  Patient was admitted to [facility] on 10/26/2015 and discharged on 10/28/2015. She was treated for syncope and acute renal failure due to dehydration with UTI. States she had poor oral intake and generalized weakness the month before admission. Did not have any seizure, incontinence or dizziness prior to syncopal episode. Treatment for this included starting Keflex. She reports excellent compliance with treatment. She reports this condition is Improved.  ------------------------------------------------------------------------------------  Past Medical History:  Diagnosis Date  . Diabetes mellitus without complication ([place])   . Diverticulitis   . Hypertension    Past Surgical History:  Procedure Laterality Date  . ABDOMINAL HYSTERECTOMY    . BACK SURGERY  08/08/2008   Lumbar discectomy  . NECK SURGERY  1980   Family History  Problem Relation Age of Onset  . Hypertension Mother   . Diabetes Mother   . Lung cancer Father    Allergies  Allergen Reactions  . Morphine Sulfate Other (See Comments)    BP bottoms out  . Penicillins Diarrhea    Has patient had a PCN reaction causing immediate rash, facial/tongue/throat swelling, SOB or lightheadedness with hypotension: Yes Has patient had a PCN reaction causing severe rash involving mucus membranes or skin necrosis: Yes Has patient had a PCN reaction that required hospitalization No Has patient had a PCN reaction occurring within the last 10 years: No If all of the above answers are "NO", then may proceed with Cephalosporin use.     Previous Medications   ASCORBIC ACID (VITAMIN C PO)    Take by mouth.   ASPIRIN 81 MG TABLET    Take by mouth.   BLOOD GLUCOSE MONITORING  SUPPL (ACCU-CHEK AVIVA CONNECT) W/DEVICE KIT    ACCU-CHEK AVIVA (In Vitro Strip)  1 (one) Strip Strip daily for 0 days  Quantity: 100;  Refills: 3   Ordered :24-Oct-2013  Lynford Humphrey ;  Started 24-Oct-2013 Active Comments: Dx: 250.00 also include Accu-chek aviva meter   BUDESONIDE (ENTOCORT EC) 3 MG 24 HR CAPSULE    TAKE 3  CAPSULES  ORAL, DAILY   CHOLECALCIFEROL (VITAMIN D) 2000 UNITS CAPS    Take by mouth.   FEEDING SUPPLEMENT, ENSURE ENLIVE, (ENSURE ENLIVE) LIQD    Take 237 mLs by mouth daily.   FUROSEMIDE (LASIX) 20 MG TABLET    TAKE ONE TABLET BY MOUTH DAILY   GLIPIZIDE (GLUCOTROL XL) 2.5 MG 24 HR TABLET    Take 1 tablet (2.5 mg total) by mouth daily with breakfast.   GLUCOSE BLOOD TEST STRIP    BAYER CONTOUR TEST (In Vitro Strip)  1 (one) Strip Strip to check blood sugar daily for 0 days  Quantity: 50;  Refills: 5   Ordered :20-May-2013  Gerald Leitz ;  Started 20-May-2013 Active Comments: & Lancets   LISINOPRIL-HYDROCHLOROTHIAZIDE (PRINZIDE,ZESTORETIC) 10-12.5 MG TABLET    1 (ONE) TABLET, ORAL, DAILY   METFORMIN (GLUCOPHAGE) 500 MG TABLET    Take 1 tablet (500 mg total) by mouth 2 (two) times daily with a meal.   MULTIPLE VITAMIN TABLET    Take by mouth. Reported on 03/13/2015   MULTIPLE VITAMINS-MINERALS (ZINC PO)    Take by mouth.   OMEGA-3 FATTY ACIDS PO  Take by mouth.   SIMVASTATIN (ZOCOR) 20 MG TABLET    TAKE 1 TABLET BY MOUTH AT BEDTIME   TRAMADOL (ULTRAM) 50 MG TABLET    Take 1 tablet (50 mg total) by mouth every 8 (eight) hours as needed.    Review of Systems  Constitutional: Negative.   Respiratory: Negative.   Cardiovascular: Negative.     Social History  Substance Use Topics  . Smoking status: Former Smoker    Quit date: 03/09/1978  . Smokeless tobacco: Never Used  . Alcohol use Yes     Comment: occasional   Objective:   BP (!) 110/52 (BP Location: Right Arm, Patient Position: Sitting, Cuff Size: Normal)   Pulse 86   Temp 97.7 F (36.5 C) (Oral)   Resp 14    Wt 172 lb 9.6 oz (78.3 kg)   BMI 32.61 kg/m  BP Readings from Last 3 Encounters:  11/13/15 (!) 110/52  10/28/15 (!) 146/55  10/23/15 98/62   Wt Readings from Last 3 Encounters:  11/13/15 172 lb 9.6 oz (78.3 kg)  10/26/15 166 lb 9.6 oz (75.6 kg)  10/23/15 167 lb 12.8 oz (76.1 kg)   Physical Exam  Constitutional: She is oriented to person, place, and time. She appears well-developed and well-nourished. No distress.  HENT:  Head: Normocephalic and atraumatic.  Right Ear: Hearing normal.  Left Ear: Hearing normal.  Nose: Nose normal.  Eyes: Conjunctivae and lids are normal. Right eye exhibits no discharge. Left eye exhibits no discharge. No scleral icterus.  Neck: Neck supple.  Cardiovascular: Normal rate and regular rhythm.   Pulmonary/Chest: Effort normal and breath sounds normal. No respiratory distress.  Abdominal: Soft. Bowel sounds are normal.  Musculoskeletal: Normal range of motion.  Neurological: She is alert and oriented to person, place, and time.  Skin: Skin is intact. No lesion and no rash noted.  Wound on right lower shin slowly healing. Followed at wound care center.  Psychiatric: She has a normal mood and affect. Her speech is normal and behavior is normal. Thought content normal.        Assessment & Plan:     1. Acute renal failure, unspecified acute renal failure type ([place]) Initial creatinine was 2.16 at hospital admission on 10-26-15. Dropped to 0.96 after IV hydration and treatment of UTI. Feeling improved and no further syncopal episodes. Will recheck labs for any sign of relapse. Continue better balance diet and fluid intake. Recheck pending reports. - Comprehensive metabolic panel  2. Urinary tract infection without hematuria, site unspecified Has been treated with Keflex and no gross hematuria or dysuria recently since discharge on 10-28-15. Urine culture at the hospital showed multiple bacterial species. Will recheck urinalysis for resolution of  infection.  - Urinalysis, Routine w reflex microscopic  3. Dehydration BP still a little low at 110/52 today. Denies dizziness or weakness. Recheck labs for resolution of dehydration. - CBC with Differential/Platelet - Comprehensive metabolic panel  4. Syncope, unspecified syncope type No recurrence since hospital discharge 10-28-15. Recheck labs and continue better hydration. - CBC with Differential/Platelet

## 2015-11-16 ENCOUNTER — Encounter: Payer: Medicare Other | Admitting: Surgery

## 2015-11-16 DIAGNOSIS — E11622 Type 2 diabetes mellitus with other skin ulcer: Secondary | ICD-10-CM | POA: Diagnosis not present

## 2015-11-17 NOTE — Progress Notes (Signed)
AVALENA, GUSTUS ([id number]) Visit Report for 11/16/2015 Chief Complaint Document Details Patient Name: [name], [name] Date of Service: 11/16/2015 10:45 AM Medical Record Number: [id number] Patient Account Number: [id number] Date of Birth/Sex: 1931/09/09 (82 y.o. Female) Treating RN: Montey Hora Primary Care Physician: [name] Other Clinician: Referring Physician: [name] Treating Physician/Extender: Frann Rider in Treatment: 7 Information Obtained from: Patient Chief Complaint Patient presents to the wound care center for a consult due non healing wound to the right lower extremity which she's had for about 3 weeks now Electronic Signature(s) Signed: 11/16/2015 11:33:37 AM By: Christin Fudge MD, FACS Entered By: Christin [name] on 11/16/2015 11:33:37 [name] ([id number]) -------------------------------------------------------------------------------- Debridement Details Patient Name: [name] Date of Service: 11/16/2015 10:45 AM Medical Record Number: [id number] Patient Account Number: [id number] Date of Birth/Sex: 13-Mar-1931 (82 y.o. Female) Treating RN: Montey Hora Primary Care Physician: [name] Other Clinician: Referring Physician: [name] Treating Physician/Extender: Frann Rider in Treatment: 7 Debridement Performed for Wound #3 Right,Anterior Lower Leg Assessment: Performed By: Physician Christin Fudge, MD Debridement: Debridement Pre-procedure Yes - 11:20 Verification/Time Out Taken: Start Time: 11:21 Pain Control: Lidocaine 4% Topical Solution Level: Skin/Subcutaneous Tissue Total Area Debrided (L x 2.8 (cm) x 2.7 (cm) = 7.56 (cm) W): Tissue and other Viable, Non-Viable, Fibrin/Slough, Subcutaneous material debrided: Instrument: Curette Bleeding: Minimum Hemostasis Achieved: Pressure End Time: 11:23 Procedural Pain: 0 Post Procedural Pain: 0 Response to Treatment: Procedure was  tolerated well Post Debridement Measurements of Total Wound Length: (cm) 2.8 Width: (cm) 2.7 Depth: (cm) 0.3 Volume: (cm) 1.781 Character of Wound/Ulcer Post Stable Debridement: Severity of Tissue Post Debridement: Fat layer exposed Post Procedure Diagnosis Same as Pre-procedure Electronic Signature(s) Signed: 11/16/2015 11:33:29 AM By: Christin Fudge MD, FACS Signed: 11/16/2015 4:21:46 PM By: Montey Hora Entered By: Christin [name] on 11/16/2015 11:33:28 [name] ([id number]) [name], [name] ([id number]) -------------------------------------------------------------------------------- HPI Details Patient Name: [name] Date of Service: 11/16/2015 10:45 AM Medical Record Number: [id number] Patient Account Number: [id number] Date of Birth/Sex: 12/02/1931 (82 y.o. Female) Treating RN: Montey Hora Primary Care Physician: [name] Other Clinician: Referring Physician: [name] Treating Physician/Extender: Frann Rider in Treatment: 7 History of Present Illness Location: lacerated wound to the right lower extremity Quality: Patient reports experiencing a dull pain to affected area(s). Severity: Patient states wound (s) are getting better. Duration: Patient has had the wound for < 3 weeks prior to presenting for treatment Timing: Pain in wound is Intermittent (comes and goes Context: The wound occurred when the patient had a blunt injury which caused a laceration to the right lower extremity Modifying Factors: Other treatment(s) tried include:was seen in the ER on 09/15/2015 and sutured with 4-0 nylon sutures. Associated Signs and Symptoms: Patient reports having increase swelling. HPI Description: 82 year old patient who had an injury to her right lower extremity on July 8 while she was in [place] boarding a plane. She was treated at [facility] with sutures being placed on 09/15/2015. She was recently seen last week by her PCP edema of the  feet and legs and after review as advised to take Lasix 20 mg daily. past hemoglobin A1c done in June 2017 was 9.1% Her past medical history is significant for diabetes mellitus, arthritis, basal cell carcinoma, collagenous colitis, essential hypertension, avitaminosis D. She is also status post abdominal hysterectomy, lumbar discectomy, neck surgery. She is a former smoker and quit in 1979. Of note in the recent past she was seen by Dr. Lucky Cowboy for what  seems a sounds like injections of varicose veins. This was only around her ankles and not in the lower extremities. No Doppler studies were done. She was not advised to wear any compression stockings. 11/02/2015 -- was admitted to the hospital between 8/18 and 10/28/2015 for a syncopal attack and was found to have acute renal failure, UTI and her x-rays checked out okay. She was evaluated and discharged home on Keflex which she is completing today. Electronic Signature(s) Signed: 11/16/2015 11:33:43 AM By: Christin Fudge MD, FACS Entered By: Christin [name] on 11/16/2015 11:33:43 [name], [name] ([id number]) -------------------------------------------------------------------------------- Physical Exam Details Patient Name: [name] Date of Service: 11/16/2015 10:45 AM Medical Record Number: [id number] Patient Account Number: [id number] Date of Birth/Sex: February 03, 1932 (82 y.o. Female) Treating RN: Montey Hora Primary Care Physician: [name] Other Clinician: Referring Physician: [name] Treating Physician/Extender: Frann Rider in Treatment: 7 Constitutional . Pulse regular. Respirations normal and unlabored. Afebrile. . Eyes Nonicteric. Reactive to light. Ears, Nose, Mouth, and Throat Lips, teeth, and gums WNL.Marland Kitchen Moist mucosa without lesions. Neck supple and nontender. No palpable supraclavicular or cervical adenopathy. Normal sized without goiter. Respiratory WNL. No retractions.. Cardiovascular Pedal  Pulses WNL. No clubbing, cyanosis or edema. Lymphatic No adneopathy. No adenopathy. No adenopathy. Musculoskeletal Adexa without tenderness or enlargement.. Digits and nails w/o clubbing, cyanosis, infection, petechiae, ischemia, or inflammatory conditions.. Integumentary (Hair, Skin) No suspicious lesions. No crepitus or fluctuance. No peri-wound warmth or erythema. No masses.Marland Kitchen Psychiatric Judgement and insight Intact.. No evidence of depression, anxiety, or agitation.. Notes significant amount of subcutaneous air is debris was removed with a #3 curet and minimal bleeding was controlled with pressure. Overall there is good progression Electronic Signature(s) Signed: 11/16/2015 11:34:19 AM By: Christin Fudge MD, FACS Entered By: Christin [name] on 11/16/2015 11:34:19 [name], [name] ([id number]) -------------------------------------------------------------------------------- Physician Orders Details Patient Name: [name] Date of Service: 11/16/2015 10:45 AM Medical Record Number: [id number] Patient Account Number: [id number] Date of Birth/Sex: [birth year] (82 y.o. Female) Treating RN: Montey Hora Primary Care Physician: [name] Other Clinician: Referring Physician: [name] Treating Physician/Extender: Frann Rider in Treatment: 7 Verbal / Phone Orders: Yes Clinician: Montey Hora Read Back and Verified: Yes Diagnosis Coding Wound Cleansing Wound #3 Right,Anterior Lower Leg o Clean wound with Normal Saline. o Cleanse wound with mild soap and water o May Shower, gently pat wound dry prior to applying new dressing. Anesthetic Wound #3 Right,Anterior Lower Leg o Topical Lidocaine 4% cream applied to wound bed prior to debridement - for clinic use Primary Wound Dressing Wound #3 Right,Anterior Lower Leg o Hydrafera Blue Secondary Dressing Wound #3 Right,Anterior Lower Leg o ABD pad o Conform/Kerlix - conform netting and  tape o Non-adherent pad Dressing Change Frequency Wound #3 Right,Anterior Lower Leg o Change dressing every other day. Follow-up Appointments Wound #3 Right,Anterior Lower Leg o Return Appointment in 1 week. Additional Orders / Instructions Wound #3 Right,Anterior Lower Leg o Increase protein intake. Medications-please add to medication list. Wound #3 Right,Anterior Lower Leg [name], [name] ([id number]) o Other: - Vitamin A, Vitamin C, Zinc, Multivitamin Electronic Signature(s) Signed: 11/16/2015 1:43:20 PM By: Christin Fudge MD, FACS Signed: 11/16/2015 4:21:46 PM By: Montey Hora Entered By: Montey Hora on 11/16/2015 11:27:17 [name], [name] ([id number]) -------------------------------------------------------------------------------- Problem List Details Patient Name: [name] Date of Service: 11/16/2015 10:45 AM Medical Record Number: [id number] Patient Account Number: [id number] Date of Birth/Sex: August 08, 1931 (82 y.o. 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 I87.311 Chronic venous hypertension (idiopathic) with ulcer of 11/09/2015 Yes right lower extremity I89.0 Lymphedema, not elsewhere classified 11/09/2015 Yes Inactive Problems Resolved Problems Electronic Signature(s) Signed: 11/16/2015 11:33:14 AM By: Christin Fudge MD, FACS Entered By: Christin [name] on 11/16/2015 11:33:14 [name]  ([id number]) -------------------------------------------------------------------------------- Progress Note Details Patient Name: [name] Date of Service: 11/16/2015 10:45 AM Medical Record Number: [id number] Patient Account Number: [id number] Date of Birth/Sex: 12-15-1931 (82 y.o. Female) Treating RN: Montey Hora Primary Care Physician: [name] Other Clinician: Referring Physician: [name] Treating Physician/Extender: Frann Rider in Treatment: 7 Subjective Chief Complaint Information obtained from Patient Patient presents to the wound care center for a consult due non healing wound to the right lower extremity which she's had for about 3 weeks now History of Present Illness (HPI) The following HPI elements were documented for the patient's wound: Location: lacerated wound to the right lower extremity Quality: Patient reports experiencing a dull pain to affected area(s). Severity: Patient states wound (s) are getting better. Duration: Patient has had the wound for < 3 weeks prior to presenting for treatment Timing: Pain in wound is Intermittent (comes and goes Context: The wound occurred when the patient had a blunt injury which caused a laceration to the right lower extremity Modifying Factors: Other treatment(s) tried include:was seen in the ER on 09/15/2015 and sutured with 4-0 nylon sutures. Associated Signs and Symptoms: Patient reports having increase swelling. 82 year old patient who had an injury to her right lower extremity on July 8 while she was in [place] boarding a plane. She was treated at [facility] with sutures being placed on 09/15/2015. She was recently seen last week by her PCP edema of the feet and legs and after review as advised to take Lasix 20 mg daily. past hemoglobin A1c done in June 2017 was 9.1% Her past medical history is significant for diabetes mellitus, arthritis, basal cell carcinoma, collagenous colitis, essential  hypertension, avitaminosis D. She is also status post abdominal hysterectomy, lumbar discectomy, neck surgery. She is a former smoker and quit in 1979. Of note in the recent past she was seen by Dr. Lucky Cowboy for what seems a sounds like injections of varicose veins. This was only around her ankles and not in the lower extremities. No Doppler studies were done. She was not advised to wear any compression stockings. 11/02/2015 -- was admitted to the hospital between 8/18 and 10/28/2015 for a syncopal attack and was found to have acute renal failure, UTI and her x-rays checked out okay. She was evaluated and discharged home on Keflex which she is completing today. [name], [name] ([id number]) Objective Constitutional Pulse regular. Respirations normal and unlabored. Afebrile. Vitals Time Taken: 11:02 AM, Height: 61 in, Weight: 170 lbs, BMI: 32.1, Temperature: 97.3 F, Pulse: 57 bpm, Respiratory Rate: 18 breaths/min, Blood Pressure: 130/60 mmHg. Eyes Nonicteric. Reactive to light. Ears, Nose, Mouth, and Throat Lips, teeth, and gums WNL.Marland Kitchen Moist mucosa without lesions. Neck supple and nontender. No palpable supraclavicular or cervical adenopathy. Normal sized without goiter. Respiratory WNL. No retractions.. Cardiovascular Pedal Pulses WNL. No clubbing, cyanosis or edema. Lymphatic No adneopathy. No adenopathy. No adenopathy. Musculoskeletal Adexa without tenderness or enlargement.Marland Kitchen  Digits and nails w/o clubbing, cyanosis, infection, petechiae, ischemia, or inflammatory conditions.Marland Kitchen Psychiatric Judgement and insight Intact.. No evidence of depression, anxiety, or agitation.. General Notes: significant amount of subcutaneous air is debris was removed with a #3 curet and minimal bleeding was controlled with pressure. Overall there is good progression Integumentary (Hair, Skin) No suspicious lesions. No crepitus or fluctuance. No peri-wound warmth or erythema. No masses.. Wound #3 status is  Open. Original cause of wound was Trauma. The wound is located on the Right,Anterior Lower Leg. The wound measures 2.8cm length x 2.7cm width x 0.3cm depth; 5.938cm^2 area and 1.781cm^3 volume. The wound is limited to skin breakdown. There is no tunneling or undermining noted. There is a large amount of serous drainage noted. The wound margin is distinct with the outline attached to the wound base. There is large (67-100%) red, pink granulation within the wound bed. There is a small (1-33%) amount of necrotic tissue within the wound bed including Adherent Slough. The periwound [name], [name] ([id number]) skin appearance had no abnormalities noted for color. The periwound skin appearance exhibited: Localized Edema, Maceration. The periwound skin appearance did not exhibit: Callus, Crepitus, Excoriation, Fluctuance, Friable, Induration, Rash, Scarring, Dry/Scaly, Moist. Periwound temperature was noted as No Abnormality. The periwound has tenderness on palpation. Assessment Active Problems ICD-10 E11.622 - Type 2 diabetes mellitus with other skin ulcer S81.811A - Laceration without foreign body, right lower leg, initial encounter L97.212 - Non-pressure chronic ulcer of right calf with fat layer exposed I87.311 - Chronic venous hypertension (idiopathic) with ulcer of right lower extremity I89.0 - Lymphedema, not elsewhere classified Procedures Wound #3 Wound #3 is a Diabetic Wound/Ulcer of the Lower Extremity located on the Right,Anterior Lower Leg . There was a Skin/Subcutaneous Tissue Debridement [id number]) debridement with total area of 7.56 sq cm performed by Christin Fudge, MD. with the following instrument(s): Curette to remove Viable and Non- Viable tissue/material including Fibrin/Slough and Subcutaneous after achieving pain control using Lidocaine 4% Topical Solution. A time out was conducted at 11:20, prior to the start of the procedure. A Minimum amount of bleeding was  controlled with Pressure. The procedure was tolerated well with a pain level of 0 throughout and a pain level of 0 following the procedure. Post Debridement Measurements: 2.8cm length x 2.7cm width x 0.3cm depth; 1.781cm^3 volume. Character of Wound/Ulcer Post Debridement is stable. Severity of Tissue Post Debridement is: Fat layer exposed. Post procedure Diagnosis Wound #3: Same as Pre-Procedure Plan Wound Cleansing: Wound #3 Right,Anterior Lower Leg: Clean wound with Normal Saline. [name], [name] ([id number]) Cleanse wound with mild soap and water May Shower, gently pat wound dry prior to applying new dressing. Anesthetic: Wound #3 Right,Anterior Lower Leg: Topical Lidocaine 4% cream applied to wound bed prior to debridement - for clinic use Primary Wound Dressing: Wound #3 Right,Anterior Lower Leg: Hydrafera Blue Secondary Dressing: Wound #3 Right,Anterior Lower Leg: ABD pad Conform/Kerlix - conform netting and tape Non-adherent pad Dressing Change Frequency: Wound #3 Right,Anterior Lower Leg: Change dressing every other day. Follow-up Appointments: Wound #3 Right,Anterior Lower Leg: Return Appointment in 1 week. Additional Orders / Instructions: Wound #3 Right,Anterior Lower Leg: Increase protein intake. Medications-please add to medication list.: Wound #3 Right,Anterior Lower Leg: Other: - Vitamin A, Vitamin C, Zinc, Multivitamin I have recommended: 1. Washing the wound with soap and water and applying RTD and a gauze and Kerlix dressing over this, on alternate days 2. Elevation of the limbs as much as possible. 3. control of her diabetes mellitus  4. Protein supplements, vitamin A, vitamin C and zinc 5. Regular visits to the wound care center. Electronic Signature(s) Signed: 11/16/2015 11:35:00 AM By: Christin Fudge MD, FACS Entered By: Christin [name] on 11/16/2015 11:35:00 [name], [name]  ([id number]) -------------------------------------------------------------------------------- SuperBill Details Patient Name: [name] Date of Service: 11/16/2015 Medical Record Number: [id number] Patient Account Number: [id number] Date of Birth/Sex: [birth year] (82 y.o. Female) Treating RN: Montey Hora Primary Care Physician: [name] Other Clinician: Referring Physician: [name] Treating Physician/Extender: Frann Rider in Treatment: 7 Diagnosis Coding ICD-10 Codes Code Description E11.622 Type 2 diabetes mellitus with other skin ulcer S81.811A Laceration without foreign body, right lower leg, initial encounter L97.212 Non-pressure chronic ulcer of right calf with fat layer exposed I87.311 Chronic venous hypertension (idiopathic) with ulcer of right lower extremity I89.0 Lymphedema, not elsewhere classified Facility Procedures CPT4 Code Description: [id number] - DEB SUBQ TISSUE 20 SQ CM/< ICD-10 Description Diagnosis E11.622 Type 2 diabetes mellitus with other skin ulcer S81.811A Laceration without foreign body, right lower leg, initi L97.212 Non-pressure chronic ulcer of  right calf with fat layer I87.311 Chronic venous hypertension (idiopathic) with ulcer of Modifier: al encounte exposed right lower Quantity: 1 r extremity Physician Procedures CPT4 Code Description: [id number] - WC PHYS SUBQ TISS 20 SQ CM ICD-10 Description Diagnosis E11.622 Type 2 diabetes mellitus with other skin ulcer S81.811A Laceration without foreign body, right lower leg, initi L97.212 Non-pressure chronic ulcer of  right calf with fat layer I87.311 Chronic venous hypertension (idiopathic) with ulcer of Modifier: al encounte exposed right lower Quantity: 1 r extremity Electronic Signature(s) Signed: 11/16/2015 11:35:17 AM By: Christin Fudge MD, FACS Entered By: Christin [name] on 11/16/2015 11:35:17

## 2015-11-17 NOTE — Progress Notes (Signed)
CHERISSE, CARRELL ([id number]) Visit Report for 11/16/2015 Arrival Information Details Patient Name: [name], [name] Date of Service: 11/16/2015 10:45 AM Medical Record Number: [id number] Patient Account Number: [id number] Date of Birth/Sex: 1931/09/06 (82 y.o. Female) Treating RN: Montey Hora Primary Care Physician: [name] Other Clinician: Referring Physician: [name] Treating Physician/Extender: Frann Rider in Treatment: 7 Visit Information History Since Last Visit Added or deleted any medications: No Patient Arrived: Ambulatory Any new allergies or adverse reactions: No Arrival Time: 10:59 Had a fall or experienced change in No Accompanied By: self activities of daily living that may affect Transfer Assistance: None risk of falls: Patient Identification Verified: Yes Signs or symptoms of abuse/neglect since last No Secondary Verification Process Yes visito Completed: Hospitalized since last visit: No Patient Requires Transmission- No Pain Present Now: Yes Based Precautions: Patient Has Alerts: Yes Patient Alerts: Patient on Blood Thinner aspirin BMI 1.04 to rt lower leg Electronic Signature(s) Signed: 11/16/2015 4:21:46 PM By: Montey Hora Entered By: Montey Hora on 11/16/2015 11:02:23 [name] ([id number]) -------------------------------------------------------------------------------- Encounter Discharge Information Details Patient Name: [name] Date of Service: 11/16/2015 10:45 AM Medical Record Number: [id number] Patient Account Number: [id number] Date of Birth/Sex: 19-Oct-1931 (82 y.o. Female) Treating RN: Montey Hora Primary Care Physician: [name] Other Clinician: Referring Physician: [name] Treating Physician/Extender: Frann Rider in Treatment: 7 Encounter Discharge Information Items Discharge Pain Level: 0 Discharge Condition: Stable Ambulatory Status: Cane Discharge  Destination: Home Transportation: Private Auto Accompanied By: self Schedule Follow-up Appointment: Yes Medication Reconciliation completed No and provided to Patient/Care Shiheem Corporan: Provided on Clinical Summary of Care: 11/16/2015 Form Type Recipient Paper Patient [facility] Electronic Signature(s) Signed: 11/16/2015 11:58:08 AM By: Montey Hora Previous Signature: 11/16/2015 11:28:17 AM Version By: Ruthine Dose Entered By: Montey Hora on 11/16/2015 11:58:08 [name] ([id number]) -------------------------------------------------------------------------------- Lower Extremity Assessment Details Patient Name: [name] Date of Service: 11/16/2015 10:45 AM Medical Record Number: [id number] Patient Account Number: [id number] Date of Birth/Sex: [birth year] (82 y.o. Female) Treating RN: Montey Hora Primary Care Physician: [name] Other Clinician: Referring Physician: [name] Treating Physician/Extender: Frann Rider in Treatment: 7 Edema Assessment Assessed: [Left: No] [Right: No] Edema: [Left: Ye] [Right: s] Vascular Assessment Pulses: Posterior Tibial Dorsalis Pedis Palpable: [Right:Yes] Extremity colors, hair growth, and conditions: Extremity Color: [Right:Mottled] Hair Growth on Extremity: [Right:No] Temperature of Extremity: [Right:Warm] Capillary Refill: [Right:< 3 seconds] Electronic Signature(s) Signed: 11/16/2015 4:21:46 PM By: Montey Hora Entered By: Montey Hora on 11/16/2015 11:06:29 Decelles, Clemens Catholic ([id number]) -------------------------------------------------------------------------------- Multi Wound Chart Details Patient Name: [name] Date of Service: 11/16/2015 10:45 AM Medical Record Number: [id number] Patient Account Number: [id number] Date of Birth/Sex: 1931-03-19 (82 y.o. Female) Treating RN: Montey Hora Primary Care Physician: [name] Other Clinician: Referring Physician: [name] Treating Physician/Extender: Frann Rider in Treatment: 7 Vital Signs Height(in): 61 Pulse(bpm): 57 Weight(lbs): 170 Blood Pressure 130/60 (mmHg): Body Mass Index(BMI): 32 Temperature(F): 97.3 Respiratory Rate 18 (breaths/min): Photos: [N/A:N/A] Wound Location: Right Lower Leg - Anterior N/A N/A Wounding Event: Trauma N/A N/A Primary Etiology: Diabetic Wound/Ulcer of N/A N/A the Lower Extremity Secondary Etiology: Venous Leg Ulcer N/A N/A Comorbid History: Cataracts, Hypertension, N/A N/A Colitis, Type II Diabetes, Osteoarthritis Date Acquired: 09/15/2015 N/A N/A Weeks of Treatment: 7 N/A N/A Wound Status: Open N/A N/A Measurements L x W x D 2.8x2.7x0.3 N/A N/A (cm) Area (cm) : 5.938 N/A N/A Volume (cm) : 1.781 N/A N/A % Reduction in Area: 89.50% N/A N/A % Reduction in Volume:  68.50% N/A N/A Classification: Grade 1 N/A N/A Exudate Amount: Large N/A N/A Exudate Type: Serous N/A N/A Exudate Color: amber N/A N/A Foul Odor After Yes N/A N/A Cleansing: Caraher, Venise R. ([id number]) Odor Anticipated Due to No N/A N/A Product Use: Wound Margin: Distinct, outline attached N/A N/A Granulation Amount: Large (67-100%) N/A N/A Granulation Quality: Red, Pink N/A N/A Necrotic Amount: Small (1-33%) N/A N/A Exposed Structures: Fascia: No N/A N/A Fat: No Tendon: No Muscle: No Joint: No Bone: No Limited to Skin Breakdown Epithelialization: None N/A N/A Periwound Skin Texture: Edema: Yes N/A N/A Excoriation: No Induration: No Callus: No Crepitus: No Fluctuance: No Friable: No Rash: No Scarring: No Periwound Skin Maceration: Yes N/A N/A Moisture: Moist: No Dry/Scaly: No Periwound Skin Color: No Abnormalities Noted N/A N/A Temperature: No Abnormality N/A N/A Tenderness on Yes N/A N/A Palpation: Wound Preparation: Ulcer Cleansing: N/A N/A Rinsed/Irrigated with Saline Topical Anesthetic Applied: Other: lidocaine 4% Treatment Notes Electronic  Signature(s) Signed: 11/16/2015 4:21:46 PM By: Montey Hora Entered By: Montey Hora on 11/16/2015 11:10:01 KADEJA, GRANADA ([id number]) -------------------------------------------------------------------------------- Multi-Disciplinary Care Plan Details Patient Name: [name] Date of Service: 11/16/2015 10:45 AM Medical Record Number: [id number] Patient Account Number: [id number] Date of Birth/Sex: 04/07/1931 (82 y.o. Female) Treating RN: Montey Hora Primary Care Physician: [name] Other Clinician: Referring Physician: [name] Treating Physician/Extender: Frann Rider in Treatment: 7 Active Inactive Abuse / Safety / Falls / Self Care Management Nursing Diagnoses: Potential for falls Goals: Patient/caregiver will verbalize understanding of skin care regimen Date Initiated: 09/28/2015 Goal Status: Active Patient/caregiver will verbalize/demonstrate measures taken to prevent injury and/or falls Date Initiated: 09/28/2015 Goal Status: Active Interventions: Assess fall risk on admission and as needed Assess: immobility, friction, shearing, incontinence upon admission and as needed Assess impairment of mobility on admission and as needed per policy Assess self care needs on admission and as needed Provide education on fall prevention Notes: Orientation to the Wound Care Program Nursing Diagnoses: Knowledge deficit related to the wound healing center program Goals: Patient/caregiver will verbalize understanding of the [place] Program Date Initiated: 09/28/2015 Goal Status: Active Interventions: Provide education on orientation to the wound center Notes: Keay, Birdia R. ([id number]) Pain, Acute or Chronic Nursing Diagnoses: Potential alteration in comfort, pain Goals: Patient will verbalize adequate pain control and receive pain control interventions during procedures as needed Date Initiated: 09/28/2015 Goal Status:  Active Patient/caregiver will verbalize adequate pain control between visits Date Initiated: 09/28/2015 Goal Status: Active Patient/caregiver will verbalize comfort level met Date Initiated: 09/28/2015 Goal Status: Active Interventions: Assess comfort goal upon admission Complete pain assessment as per visit requirements Encourage patient to take pain medications as prescribed Notes: Wound/Skin Impairment Nursing Diagnoses: Impaired tissue integrity Goals: Ulcer/skin breakdown will heal within 14 weeks Date Initiated: 09/28/2015 Goal Status: Active Interventions: Assess patient/caregiver ability to obtain necessary supplies Assess patient/caregiver ability to perform ulcer/skin care regimen upon admission and as needed Provide education on ulcer and skin care Notes: Electronic Signature(s) Signed: 11/16/2015 4:21:46 PM By: Montey Hora Entered By: Montey Hora on 11/16/2015 11:06:45 Graziosi, Clemens Catholic ([id number]) -------------------------------------------------------------------------------- Pain Assessment Details Patient Name: [name] Date of Service: 11/16/2015 10:45 AM Medical Record Number: [id number] Patient Account Number: [id number] Date of Birth/Sex: Mar 29, 1931 (82 y.o. Female) Treating RN: Montey Hora Primary Care Physician: [name] Other Clinician: Referring Physician: [name] Treating Physician/Extender: Frann Rider in Treatment: 7 Active Problems Location of Pain Severity and Description of Pain Patient Has Paino Yes Site Locations Pain Location:  Pain in Ulcers With Dressing Change: Yes Duration of the Pain. Constant / Intermittento Constant Pain Management and Medication Current Pain Management: Electronic Signature(s) Signed: 11/16/2015 4:21:46 PM By: Montey Hora Entered By: Montey Hora on 11/16/2015 11:02:34 [name]  ([id number]) -------------------------------------------------------------------------------- Patient/Caregiver Education Details Patient Name: [name] Date of Service: 11/16/2015 10:45 AM Medical Record Number: [id number] Patient Account Number: [id number] Date of Birth/Gender: 02-Mar-1932 (82 y.o. Female) Treating RN: Montey Hora Primary Care Physician: [name] Other Clinician: Referring Physician: [name] Treating Physician/Extender: Frann Rider in Treatment: 7 Education Assessment Education Provided To: Patient Education Topics Provided Wound/Skin Impairment: Handouts: Other: wound care as ordered Methods: Demonstration, Explain/Verbal Responses: State content correctly Electronic Signature(s) Signed: 11/16/2015 4:21:46 PM By: Montey Hora Entered By: Montey Hora on 11/16/2015 11:58:53 Dozier, Clemens Catholic ([id number]) -------------------------------------------------------------------------------- Wound Assessment Details Patient Name: [name] Date of Service: 11/16/2015 10:45 AM Medical Record Number: [id number] Patient Account Number: [id number] Date of Birth/Sex: 07-21-1931 (82 y.o. Female) Treating RN: Montey Hora Primary Care Physician: [name] Other Clinician: Referring Physician: [name] Treating Physician/Extender: Frann Rider in Treatment: 7 Wound Status Wound Number: 3 Primary Diabetic Wound/Ulcer of the Lower Etiology: Extremity Wound Location: Right Lower Leg - Anterior Secondary Venous Leg Ulcer Wounding Event: Trauma Etiology: Date Acquired: 09/15/2015 Wound Status: Open Weeks Of Treatment: 7 Comorbid Cataracts, Hypertension, Colitis, Clustered Wound: No History: Type II Diabetes, Osteoarthritis Photos Wound Measurements Length: (cm) 2.8 Width: (cm) 2.7 Depth: (cm) 0.3 Area: (cm) 5.938 Volume: (cm) 1.781 % Reduction in Area: 89.5% % Reduction in Volume:  68.5% Epithelialization: None Tunneling: No Undermining: No Wound Description Classification: Grade 1 Foul Odor Af Wound Margin: Distinct, outline attached Due to Produ Exudate Amount: Large Exudate Type: Serous Exudate Color: amber ter Cleansing: Yes ct Use: No Wound Bed Granulation Amount: Large (67-100%) Exposed Structure Granulation Quality: Red, Pink Fascia Exposed: No Necrotic Amount: Small (1-33%) Fat Layer Exposed: No Necrotic Quality: Adherent Slough Tendon Exposed: No Muscle Exposed: No Griffith, Jullia R. ([id number]) Joint Exposed: No Bone Exposed: No Limited to Skin Breakdown Periwound Skin Texture Texture Color No Abnormalities Noted: No No Abnormalities Noted: Yes Callus: No Temperature / Pain Crepitus: No Temperature: No Abnormality Excoriation: No Tenderness on Palpation: Yes Fluctuance: No Friable: No Induration: No Localized Edema: Yes Rash: No Scarring: No Moisture No Abnormalities Noted: No Dry / Scaly: No Maceration: Yes Moist: No Wound Preparation Ulcer Cleansing: Rinsed/Irrigated with Saline Topical Anesthetic Applied: Other: lidocaine 4%, Treatment Notes Wound #3 (Right, Anterior Lower Leg) 1. Cleansed with: Clean wound with Normal Saline 2. Anesthetic Topical Lidocaine 4% cream to wound bed prior to debridement 4. Dressing Applied: Hydrafera Blue 5. Secondary Dressing Applied ABD and Kerlix/Conform 7. Secured with Tape Notes netting Electronic Signature(s) Signed: 11/16/2015 4:21:46 PM By: Montey Hora Entered By: Montey Hora on 11/16/2015 11:09:32 ALAUNA, HAYDEN ([id number]) -------------------------------------------------------------------------------- Vitals Details Patient Name: [name] Date of Service: 11/16/2015 10:45 AM Medical Record Number: [id number] Patient Account Number: [id number] Date of Birth/Sex: 04/12/1931 (82 y.o. Female) Treating RN: Montey Hora Primary Care Physician: [name] Other Clinician: Referring Physician: [name] Treating Physician/Extender: Frann Rider in Treatment: 7 Vital Signs Time Taken: 11:02 Temperature (F): 97.3 Height (in): 61 Pulse (bpm): 57 Weight (lbs): 170 Respiratory Rate (breaths/min): 18 Body Mass Index (BMI): 32.1 Blood Pressure (mmHg): 130/60 Reference Range: 80 - 120 mg / dl Electronic Signature(s) Signed: 11/16/2015 4:21:46 PM By: Montey Hora Entered By: Montey Hora on 11/16/2015 11:02:55

## 2015-11-20 DIAGNOSIS — E11621 Type 2 diabetes mellitus with foot ulcer: Secondary | ICD-10-CM | POA: Diagnosis not present

## 2015-11-20 DIAGNOSIS — Z7982 Long term (current) use of aspirin: Secondary | ICD-10-CM | POA: Diagnosis not present

## 2015-11-20 DIAGNOSIS — I10 Essential (primary) hypertension: Secondary | ICD-10-CM | POA: Diagnosis not present

## 2015-11-20 DIAGNOSIS — N390 Urinary tract infection, site not specified: Secondary | ICD-10-CM | POA: Diagnosis not present

## 2015-11-20 DIAGNOSIS — R2689 Other abnormalities of gait and mobility: Secondary | ICD-10-CM

## 2015-11-20 DIAGNOSIS — L97821 Non-pressure chronic ulcer of other part of left lower leg limited to breakdown of skin: Secondary | ICD-10-CM

## 2015-11-21 ENCOUNTER — Observation Stay (HOSPITAL_BASED_OUTPATIENT_CLINIC_OR_DEPARTMENT_OTHER)
Admit: 2015-11-21 | Discharge: 2015-11-21 | Disposition: A | Discharge status: Home / Self Care | Payer: Medicare Other | Attending: Internal Medicine | Admitting: Internal Medicine

## 2015-11-21 ENCOUNTER — Other Ambulatory Visit: Payer: Self-pay

## 2015-11-21 ENCOUNTER — Observation Stay
Admission: EM | Admit: 2015-11-21 | Discharge: 2015-11-22 | Disposition: A | Discharge status: Home / Self Care | Payer: Medicare Other | Attending: Internal Medicine | Admitting: Internal Medicine

## 2015-11-21 ENCOUNTER — Encounter: Payer: Self-pay | Admitting: Intensive Care

## 2015-11-21 DIAGNOSIS — R55 Syncope and collapse: Principal | ICD-10-CM | POA: Diagnosis present

## 2015-11-21 DIAGNOSIS — S0003XA Contusion of scalp, initial encounter: Secondary | ICD-10-CM | POA: Diagnosis not present

## 2015-11-21 DIAGNOSIS — Z833 Family history of diabetes mellitus: Secondary | ICD-10-CM | POA: Diagnosis not present

## 2015-11-21 DIAGNOSIS — Z880 Allergy status to penicillin: Secondary | ICD-10-CM | POA: Diagnosis not present

## 2015-11-21 DIAGNOSIS — Z85828 Personal history of other malignant neoplasm of skin: Secondary | ICD-10-CM | POA: Insufficient documentation

## 2015-11-21 DIAGNOSIS — R569 Unspecified convulsions: Secondary | ICD-10-CM | POA: Diagnosis present

## 2015-11-21 DIAGNOSIS — M50323 Other cervical disc degeneration at C6-C7 level: Secondary | ICD-10-CM | POA: Diagnosis not present

## 2015-11-21 DIAGNOSIS — I493 Ventricular premature depolarization: Secondary | ICD-10-CM | POA: Diagnosis not present

## 2015-11-21 DIAGNOSIS — M1990 Unspecified osteoarthritis, unspecified site: Secondary | ICD-10-CM | POA: Insufficient documentation

## 2015-11-21 DIAGNOSIS — E1159 Type 2 diabetes mellitus with other circulatory complications: Secondary | ICD-10-CM | POA: Diagnosis present

## 2015-11-21 DIAGNOSIS — E669 Obesity, unspecified: Secondary | ICD-10-CM | POA: Insufficient documentation

## 2015-11-21 DIAGNOSIS — E119 Type 2 diabetes mellitus without complications: Secondary | ICD-10-CM | POA: Insufficient documentation

## 2015-11-21 DIAGNOSIS — M50322 Other cervical disc degeneration at C5-C6 level: Secondary | ICD-10-CM | POA: Insufficient documentation

## 2015-11-21 DIAGNOSIS — Z885 Allergy status to narcotic agent status: Secondary | ICD-10-CM | POA: Diagnosis not present

## 2015-11-21 DIAGNOSIS — Z87891 Personal history of nicotine dependence: Secondary | ICD-10-CM | POA: Diagnosis not present

## 2015-11-21 DIAGNOSIS — I340 Nonrheumatic mitral (valve) insufficiency: Secondary | ICD-10-CM | POA: Insufficient documentation

## 2015-11-21 DIAGNOSIS — R609 Edema, unspecified: Secondary | ICD-10-CM

## 2015-11-21 DIAGNOSIS — M50321 Other cervical disc degeneration at C4-C5 level: Secondary | ICD-10-CM | POA: Diagnosis not present

## 2015-11-21 DIAGNOSIS — I739 Peripheral vascular disease, unspecified: Secondary | ICD-10-CM | POA: Insufficient documentation

## 2015-11-21 DIAGNOSIS — Z9841 Cataract extraction status, right eye: Secondary | ICD-10-CM | POA: Insufficient documentation

## 2015-11-21 DIAGNOSIS — E559 Vitamin D deficiency, unspecified: Secondary | ICD-10-CM | POA: Insufficient documentation

## 2015-11-21 DIAGNOSIS — E7800 Pure hypercholesterolemia, unspecified: Secondary | ICD-10-CM | POA: Insufficient documentation

## 2015-11-21 DIAGNOSIS — Z8249 Family history of ischemic heart disease and other diseases of the circulatory system: Secondary | ICD-10-CM | POA: Insufficient documentation

## 2015-11-21 DIAGNOSIS — X58XXXA Exposure to other specified factors, initial encounter: Secondary | ICD-10-CM | POA: Insufficient documentation

## 2015-11-21 DIAGNOSIS — C4491 Basal cell carcinoma of skin, unspecified: Secondary | ICD-10-CM | POA: Insufficient documentation

## 2015-11-21 DIAGNOSIS — E785 Hyperlipidemia, unspecified: Secondary | ICD-10-CM | POA: Diagnosis not present

## 2015-11-21 DIAGNOSIS — G919 Hydrocephalus, unspecified: Secondary | ICD-10-CM | POA: Insufficient documentation

## 2015-11-21 DIAGNOSIS — D692 Other nonthrombocytopenic purpura: Secondary | ICD-10-CM | POA: Diagnosis not present

## 2015-11-21 DIAGNOSIS — Z801 Family history of malignant neoplasm of trachea, bronchus and lung: Secondary | ICD-10-CM | POA: Insufficient documentation

## 2015-11-21 DIAGNOSIS — I152 Hypertension secondary to endocrine disorders: Secondary | ICD-10-CM | POA: Diagnosis present

## 2015-11-21 DIAGNOSIS — Z6831 Body mass index (BMI) 31.0-31.9, adult: Secondary | ICD-10-CM | POA: Insufficient documentation

## 2015-11-21 DIAGNOSIS — I10 Essential (primary) hypertension: Secondary | ICD-10-CM | POA: Diagnosis not present

## 2015-11-21 DIAGNOSIS — R600 Localized edema: Secondary | ICD-10-CM

## 2015-11-21 HISTORY — DX: Personal history of other medical treatment: Z92.89

## 2015-11-21 HISTORY — DX: Syncope and collapse: R55

## 2015-11-21 LAB — COMPREHENSIVE METABOLIC PANEL
ALK PHOS: 42 U/L (ref 38–126)
ALT: 31 U/L (ref 14–54)
ANION GAP: 11 (ref 5–15)
AST: 28 U/L (ref 15–41)
Albumin: 4 g/dL (ref 3.5–5.0)
BUN: 28 mg/dL — ABNORMAL HIGH (ref 6–20)
CALCIUM: 9.6 mg/dL (ref 8.9–10.3)
CHLORIDE: 104 mmol/L (ref 101–111)
CO2: 24 mmol/L (ref 22–32)
CREATININE: 1.08 mg/dL — AB (ref 0.44–1.00)
GFR, EST AFRICAN AMERICAN: 54 mL/min — AB (ref >60)
GFR, EST NON AFRICAN AMERICAN: 46 mL/min — AB (ref >60)
Glucose, Bld: 173 mg/dL — ABNORMAL HIGH (ref 65–99)
Potassium: 4.1 mmol/L (ref 3.5–5.1)
SODIUM: 139 mmol/L (ref 135–145)
Total Bilirubin: 0.5 mg/dL (ref 0.3–1.2)
Total Protein: 6.8 g/dL (ref 6.5–8.1)

## 2015-11-21 LAB — GLUCOSE, CAPILLARY
GLUCOSE-CAPILLARY: 164 mg/dL — AB (ref 65–99)
GLUCOSE-CAPILLARY: 136 mg/dL — AB (ref 65–99)
Glucose-Capillary: 169 mg/dL — ABNORMAL HIGH (ref 65–99)

## 2015-11-21 LAB — TROPONIN I
Troponin I: <0.03 ng/mL (ref <0.03)
Troponin I: <0.03 ng/mL (ref <0.03)

## 2015-11-21 LAB — CBC
HCT: 39.8 % (ref 35.0–47.0)
HEMOGLOBIN: 13.6 g/dL (ref 12.0–16.0)
MCH: 30.9 pg (ref 26.0–34.0)
MCHC: 34.1 g/dL (ref 32.0–36.0)
MCV: 90.6 fL (ref 80.0–100.0)
PLATELETS: 313 10*3/uL (ref 150–440)
RBC: 4.39 MIL/uL (ref 3.80–5.20)
RDW: 13.7 % (ref 11.5–14.5)
WBC: 15.5 10*3/uL — AB (ref 3.6–11.0)

## 2015-11-21 LAB — URINALYSIS COMPLETE WITH MICROSCOPIC (ARMC ONLY)
BILIRUBIN URINE: NEGATIVE
Bacteria, UA: NONE SEEN
Glucose, UA: NEGATIVE mg/dL
HGB URINE DIPSTICK: NEGATIVE
KETONES UR: NEGATIVE mg/dL
Nitrite: NEGATIVE
PH: 5 (ref 5.0–8.0)
PROTEIN: NEGATIVE mg/dL
SPECIFIC GRAVITY, URINE: 1.016 (ref 1.005–1.030)

## 2015-11-21 LAB — LIPASE, BLOOD: LIPASE: 33 U/L (ref 11–51)

## 2015-11-21 MED ORDER — ONDANSETRON 8 MG PO TBDP
ORAL_TABLET | ORAL | Status: AC
  Administered 2015-11-21: 4 mg via ORAL
  Filled 2015-11-21: qty 1

## 2015-11-21 MED ORDER — ONDANSETRON HCL 4 MG PO TABS: 4.0000 mg | ORAL_TABLET | Freq: Four times a day (QID) | ORAL | Status: DC | PRN

## 2015-11-21 MED ORDER — SODIUM CHLORIDE 0.9% FLUSH
3.0000 mL | Freq: Two times a day (BID) | INTRAVENOUS | Status: DC
  Administered 2015-11-22: 3 mL via INTRAVENOUS

## 2015-11-21 MED ORDER — HYDROCHLOROTHIAZIDE 12.5 MG PO CAPS
12.5000 mg | ORAL_CAPSULE | Freq: Every day | ORAL | Status: DC
  Administered 2015-11-22: 12.5 mg via ORAL
  Filled 2015-11-21: qty 1

## 2015-11-21 MED ORDER — GLIPIZIDE ER 2.5 MG PO TB24
2.5000 mg | ORAL_TABLET | Freq: Every day | ORAL | Status: DC
  Administered 2015-11-22: 2.5 mg via ORAL
  Filled 2015-11-21: qty 1

## 2015-11-21 MED ORDER — ONDANSETRON HCL 4 MG/2ML IJ SOLN
4.0000 mg | Freq: Once | INTRAMUSCULAR | Status: AC
  Administered 2015-11-21: 4 mg via INTRAVENOUS

## 2015-11-21 MED ORDER — SODIUM CHLORIDE 0.9% FLUSH
3.0000 mL | Freq: Two times a day (BID) | INTRAVENOUS | Status: DC
  Administered 2015-11-21: 3 mL via INTRAVENOUS

## 2015-11-21 MED ORDER — LISINOPRIL 10 MG PO TABS
10.0000 mg | ORAL_TABLET | Freq: Every day | ORAL | Status: DC
  Administered 2015-11-22: 10 mg via ORAL
  Filled 2015-11-21: qty 1

## 2015-11-21 MED ORDER — ALBUTEROL SULFATE (2.5 MG/3ML) 0.083% IN NEBU: 2.5000 mg | INHALATION_SOLUTION | RESPIRATORY_TRACT | Status: DC | PRN

## 2015-11-21 MED ORDER — BUDESONIDE 3 MG PO CPEP
3.0000 mg | ORAL_CAPSULE | Freq: Every day | ORAL | Status: DC
  Administered 2015-11-22: 3 mg via ORAL
  Filled 2015-11-21: qty 1

## 2015-11-21 MED ORDER — ACETAMINOPHEN 650 MG RE SUPP: 650.0000 mg | Freq: Four times a day (QID) | RECTAL | Status: DC | PRN

## 2015-11-21 MED ORDER — ACETAMINOPHEN 325 MG PO TABS: 650.0000 mg | ORAL_TABLET | Freq: Four times a day (QID) | ORAL | Status: DC | PRN

## 2015-11-21 MED ORDER — ASPIRIN EC 81 MG PO TBEC
81.0000 mg | DELAYED_RELEASE_TABLET | Freq: Every day | ORAL | Status: DC
  Administered 2015-11-21 – 2015-11-22 (×2): 81 mg via ORAL
  Filled 2015-11-21 (×2): qty 1

## 2015-11-21 MED ORDER — SODIUM CHLORIDE 0.9% FLUSH: 3.0000 mL | INTRAVENOUS | Status: DC | PRN

## 2015-11-21 MED ORDER — LISINOPRIL-HYDROCHLOROTHIAZIDE 10-12.5 MG PO TABS: 1.0000 | ORAL_TABLET | Freq: Every day | ORAL | Status: DC

## 2015-11-21 MED ORDER — ONDANSETRON HCL 4 MG/2ML IJ SOLN: 4.0000 mg | Freq: Four times a day (QID) | INTRAMUSCULAR | Status: DC | PRN

## 2015-11-21 MED ORDER — SIMVASTATIN 40 MG PO TABS
20.0000 mg | ORAL_TABLET | Freq: Every day | ORAL | Status: DC
Start: 2015-11-21 — End: 2015-11-22
  Administered 2015-11-21: 20 mg via ORAL
  Filled 2015-11-21: qty 1

## 2015-11-21 MED ORDER — BISACODYL 10 MG RE SUPP: 10.0000 mg | Freq: Every day | RECTAL | Status: DC | PRN

## 2015-11-21 MED ORDER — ENOXAPARIN SODIUM 40 MG/0.4ML ~~LOC~~ SOLN
40.0000 mg | SUBCUTANEOUS | Status: DC
  Administered 2015-11-21: 40 mg via SUBCUTANEOUS
  Filled 2015-11-21: qty 0.4

## 2015-11-21 MED ORDER — ONDANSETRON HCL 4 MG/2ML IJ SOLN
INTRAMUSCULAR | Status: AC
  Administered 2015-11-21: 4 mg via INTRAVENOUS
  Filled 2015-11-21: qty 2

## 2015-11-21 MED ORDER — INFLUENZA VAC SPLIT QUAD 0.5 ML IM SUSY: 0.5000 mL | PREFILLED_SYRINGE | INTRAMUSCULAR | Status: DC

## 2015-11-21 MED ORDER — ONDANSETRON 4 MG PO TBDP
4.0000 mg | ORAL_TABLET | Freq: Once | ORAL | Status: AC | PRN
Start: 2015-11-21 — End: 2015-11-21
  Administered 2015-11-21: 4 mg via ORAL
  Filled 2015-11-21: qty 1

## 2015-11-21 MED ORDER — POLYETHYLENE GLYCOL 3350 17 G PO PACK: 17.0000 g | PACK | Freq: Every day | ORAL | Status: DC | PRN

## 2015-11-21 MED ORDER — SODIUM CHLORIDE 0.9 % IV SOLN: 250.0000 mL | INTRAVENOUS | Status: DC | PRN

## 2015-11-21 MED ORDER — SODIUM CHLORIDE 0.9 % IV BOLUS (SEPSIS)
1000.0000 mL | Freq: Once | INTRAVENOUS | Status: AC
  Administered 2015-11-21: 1000 mL via INTRAVENOUS

## 2015-11-21 MED ORDER — INSULIN ASPART 100 UNIT/ML ~~LOC~~ SOLN
0.0000 [IU] | Freq: Three times a day (TID) | SUBCUTANEOUS | Status: DC
  Administered 2015-11-21: 2 [IU] via SUBCUTANEOUS
  Filled 2015-11-21: qty 2

## 2015-11-21 MED ORDER — METFORMIN HCL 500 MG PO TABS
500.0000 mg | ORAL_TABLET | Freq: Two times a day (BID) | ORAL | Status: DC
  Administered 2015-11-21 – 2015-11-22 (×2): 500 mg via ORAL
  Filled 2015-11-21 (×2): qty 1

## 2015-11-21 NOTE — H&P (Signed)
[facility] [place] NAME: [name]    MR#:  [id number]  DATE OF BIRTH:  12-09-1931  DATE OF ADMISSION:  11/21/2015  PRIMARY CARE PHYSICIAN: [name], PA   REQUESTING/REFERRING PHYSICIAN: Dr. Kerman Passey  CHIEF COMPLAINT:   Chief Complaint  Patient presents with  . Nausea    HISTORY OF PRESENT ILLNESS:  [name] is a 82 y.o. female with a past medical history of diabetes, diverticulitis, hypertension who presents the emergency department after seizure versus syncope. According to the patient and her daughter the right and open house in an air conditioned home, when all of a sudden the patient became very diaphoretic very lightheaded and nauseated. Daughter reports approximately 15-30 seconds of loss of consciousness with what appears most consistent with hypoxic jerking. States the patient was then able to wake up and answer questions. Daughter states moderate diaphoresis. Brought the patient to the emergency department for evaluation while in the triage room the patient again had a 32nd episode of LOC with hypoxic jerking and diaphoresis. Patient brought emergently back to room 17 in the emergency department, with continued mild diaphoresis and significant nausea. Patient denies any chest pain at any point. Denies any abdominal pain. Patient denies any history of syncopal episodes or seizure activity.   PAST MEDICAL HISTORY:   Past Medical History:  Diagnosis Date  . Diabetes mellitus without complication ([place])   . Diverticulitis   . Hypertension     PAST SURGICAL HISTORY:   Past Surgical History:  Procedure Laterality Date  . ABDOMINAL HYSTERECTOMY    . BACK SURGERY  08/08/2008   Lumbar discectomy  . NECK SURGERY  1980    SOCIAL HISTORY:   Social History  Substance Use Topics  . Smoking status: Former Smoker    Quit date: 03/09/1978  . Smokeless tobacco: Never Used  . Alcohol use Yes   Comment: occasional    FAMILY HISTORY:   Family History  Problem Relation Age of Onset  . Hypertension Mother   . Diabetes Mother   . Lung cancer Father     DRUG ALLERGIES:   Allergies  Allergen Reactions  . Morphine Sulfate Other (See Comments)    BP bottoms out  . Penicillins Diarrhea    Has patient had a PCN reaction causing immediate rash, facial/tongue/throat swelling, SOB or lightheadedness with hypotension: Yes Has patient had a PCN reaction causing severe rash involving mucus membranes or skin necrosis: Yes Has patient had a PCN reaction that required hospitalization No Has patient had a PCN reaction occurring within the last 10 years: No If all of the above answers are "NO", then may proceed with Cephalosporin use.    REVIEW OF SYSTEMS:   Review of Systems  Constitutional: Positive for diaphoresis. Negative for chills, fever and weight loss.  HENT: Negative for hearing loss and nosebleeds.   Eyes: Negative for blurred vision, double vision and pain.  Respiratory: Negative for cough, hemoptysis, sputum production, shortness of breath and wheezing.   Cardiovascular: Negative for chest pain, palpitations, orthopnea and leg swelling.  Gastrointestinal: Positive for nausea. Negative for abdominal pain, constipation, diarrhea and vomiting.  Genitourinary: Negative for dysuria and hematuria.  Musculoskeletal: Negative for back pain, falls and myalgias.  Skin: Negative for rash.  Neurological: Positive for loss of consciousness. Negative for dizziness, tremors, sensory change, speech change, focal weakness, seizures and headaches.  Endo/Heme/Allergies: Does not bruise/bleed easily.  Psychiatric/Behavioral: Negative for depression and memory loss. The  patient is not nervous/anxious.     MEDICATIONS AT HOME:   Prior to Admission medications   Medication Sig Start Date End Date Taking? Authorizing Provider  Ascorbic Acid (VITAMIN C PO) Take 1 tablet by mouth daily.    Yes  Historical Provider, MD  aspirin 81 MG tablet Take 81 mg by mouth daily.  01/02/10  Yes Historical Provider, MD  budesonide (ENTOCORT EC) 3 MG 24 hr capsule TAKE 3  CAPSULES  ORAL, DAILY 07/25/15  Yes Lucilla Lame, MD  Cholecalciferol (VITAMIN D) 2000 UNITS CAPS Take 1 capsule by mouth daily.    Yes Historical Provider, MD  furosemide (LASIX) 20 MG tablet TAKE ONE TABLET BY MOUTH DAILY Patient taking differently: TAKE ONE TABLET BY MOUTH DAILY PRN leg swelling 11/08/15  Yes Dennis E Chrismon, PA  glipiZIDE (GLUCOTROL XL) 2.5 MG 24 hr tablet Take 1 tablet (2.5 mg total) by mouth daily with breakfast. 08/15/15  Yes Margarita Rana, MD  lisinopril-hydrochlorothiazide (PRINZIDE,ZESTORETIC) 10-12.5 MG tablet 1 (ONE) TABLET, ORAL, DAILY 08/31/15  Yes Margarita Rana, MD  metFORMIN (GLUCOPHAGE) 500 MG tablet Take 1 tablet (500 mg total) by mouth 2 (two) times daily with a meal. 08/15/15  Yes Margarita Rana, MD  Multiple Vitamin tablet Take 1 tablet by mouth every evening. Reported on 03/13/2015   Yes Historical Provider, MD  Multiple Vitamins-Minerals (ZINC PO) Take 1 tablet by mouth daily.   Yes Historical Provider, MD  simvastatin (ZOCOR) 20 MG tablet TAKE 1 TABLET BY MOUTH AT BEDTIME 05/14/15  Yes Margarita Rana, MD  VITAMIN A OP Apply 1 tablet to eye daily.   Yes Historical Provider, MD     VITAL SIGNS:  Blood pressure (!) 110/50, pulse 72, temperature 97.8 F (36.6 C), temperature source Oral, resp. rate 13, height 5' 1.5" (1.562 m), weight 77.1 kg (170 lb), SpO2 100 %.  PHYSICAL EXAMINATION:  Physical Exam  GENERAL:  82 y.o.-year-old patient lying in the bed with no acute distress.  EYES: Pupils equal, round, reactive to light and accommodation. No scleral icterus. Extraocular muscles intact.  HEENT: Head atraumatic, normocephalic. Oropharynx and nasopharynx clear. No oropharyngeal erythema, moist oral mucosa . Left facial droop which is chronic NECK:  Supple, no jugular venous distention. No thyroid  enlargement, no tenderness.  LUNGS: Normal breath sounds bilaterally, no wheezing, rales, rhonchi. No use of accessory muscles of respiration.  CARDIOVASCULAR: S1, S2 normal. No murmurs, rubs, or gallops.  ABDOMEN: Soft, nontender, nondistended. Bowel sounds present. No organomegaly or mass.  EXTREMITIES: No pedal edema, cyanosis, or clubbing. + 2 pedal & radial pulses b/l.   NEUROLOGIC: Cranial nerves II through XII are intact. No focal Motor or sensory deficits appreciated b/l PSYCHIATRIC: The patient is alert and oriented x 3. Good affect.  SKIN: No obvious rash, lesion, or ulcer.   LABORATORY PANEL:   CBC  Recent Labs Lab 11/21/15 1259  WBC 15.5*  HGB 13.6  HCT 39.8  PLT 313   ------------------------------------------------------------------------------------------------------------------  Chemistries   Recent Labs Lab 11/21/15 1259  NA 139  K 4.1  CL 104  CO2 24  GLUCOSE 173*  BUN 28*  CREATININE 1.08*  CALCIUM 9.6  AST 28  ALT 31  ALKPHOS 42  BILITOT 0.5   ------------------------------------------------------------------------------------------------------------------  Cardiac Enzymes  Recent Labs Lab 11/21/15 1259  TROPONINI <0.03   ------------------------------------------------------------------------------------------------------------------  RADIOLOGY:  No results found.   IMPRESSION AND PLAN:   * Syncope Etiology unclear. She did have some prodromal symptoms but mainly nausea and sweating. Could  be cardiac in origin. Admit under observation. Repeat troponin. Monitor on telemetry. Consult cardiology. Check echocardiogram. Normal electrolytes. Orthostatics checked and normal  * Possible seizure Could be syncopal convulsion. Consult neurology. Check MRI of the brain. Seizure precautions. No medications at this time.  * Hypertension New home medications  * Chronic right leg wound Follows at the wound care center  All the records are  reviewed and case discussed with ED provider. Management plans discussed with the patient, family and they are in agreement.  CODE STATUS: FULL CODE  TOTAL TIME TAKING CARE OF THIS PATIENT: 40 minutes.   Hillary Bow R M.D on 11/21/2015 at 4:23 PM  Between 7am to 6pm - Pager - [phone]  After 6pm go to [email or web address] - password [email or web address] [facility]  Office  [phone]  CC: Primary care physician; [name], PA  Note: This dictation was prepared with Dragon dictation along with smaller phrase technology. Any transcriptional errors that result from this process are unintentional.

## 2015-11-21 NOTE — ED Notes (Signed)
PAtient had an syncopal episode with LOC with this RN and joanna tech while triaging patient. Patient became red and diaphoretic. Episode lasted about 25 seconds. Patient was then able to speak to MD and RNs

## 2015-11-21 NOTE — ED Provider Notes (Signed)
[facility] Emergency Department Provider Note  Time seen: 1:12 PM  I have reviewed the triage vital signs and the nursing notes.   HISTORY  Chief Complaint Nausea    HPI [name] is a 82 y.o. female with a past medical history of diabetes, diverticulitis, hypertension who presents the emergency department after seizure versus syncope. According to the patient and her daughter the right and open house in an air conditioned home, when all of a sudden the patient became very diaphoretic very lightheaded and nauseated. Daughter reports approximately 15-30 seconds of loss of consciousness with what appears most consistent with hypoxic jerking. States the patient was then able to wake up and answer questions. Daughter states moderate diaphoresis. Brought the patient to the emergency department for evaluation while in the triage room the patient again had a 32nd episode of LOC with hypoxic jerking and diaphoresis. Patient brought emergently back to room 17 in the emergency department, with continued mild diaphoresis and significant nausea. Patient denies any chest pain at any point. Denies any abdominal pain. Patient denies any history of syncopal episodes or seizure activity.  Past Medical History:  Diagnosis Date  . Diabetes mellitus without complication ([place])   . Diverticulitis   . Hypertension     Patient Active Problem List   Diagnosis Date Noted  . ARF (acute renal failure) ([place]) 10/26/2015  . Laceration of right lower leg 09/28/2015  . Senile purpura ([place]) 01/01/2015  . Edema 08/15/2014  . Arthritis 07/12/2014  . Basal cell carcinoma 07/12/2014  . CC (collagenous colitis) 07/12/2014  . Essential (primary) hypertension 07/12/2014  . Hypercholesteremia 07/12/2014  . Adiposity 07/12/2014  . Acne erythematosa 07/12/2014  . Diabetes mellitus, type 2 ([place]) 07/12/2014  . 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 Avitaminosis D 07/12/2014  . H/O malignant neoplasm of  skin 10/10/2011    Past Surgical History:  Procedure Laterality Date  . ABDOMINAL HYSTERECTOMY    . BACK SURGERY  08/08/2008   Lumbar discectomy  . [place]    Prior to Admission medications   Medication Sig Start Date End Date Taking? Authorizing Provider  Ascorbic Acid (VITAMIN C PO) Take by mouth.    Historical Provider, MD  aspirin 81 MG tablet Take by mouth. 01/02/10   Historical Provider, MD  Blood Glucose Monitoring Suppl (ACCU-CHEK AVIVA CONNECT) W/DEVICE KIT ACCU-CHEK AVIVA (In Vitro Strip)  1 (one) Strip Strip daily for 0 days  Quantity: 100;  Refills: 3   Ordered :24-Oct-2013  Lynford Humphrey ;  Started 24-Oct-2013 Active Comments: Dx: 250.00 also include Accu-chek aviva meter 10/24/13   Historical Provider, MD  budesonide (ENTOCORT EC) 3 MG 24 hr capsule TAKE 3  CAPSULES  ORAL, DAILY 07/25/15   Lucilla Lame, MD  Cholecalciferol (VITAMIN D) 2000 UNITS CAPS Take by mouth.    Historical Provider, MD  feeding supplement, ENSURE ENLIVE, (ENSURE ENLIVE) LIQD Take 237 mLs by mouth daily. 10/28/15   Fritzi Mandes, MD  furosemide (LASIX) 20 MG tablet TAKE ONE TABLET BY MOUTH DAILY 11/08/15   Vickki Muff Chrismon, PA  glipiZIDE (GLUCOTROL XL) 2.5 MG 24 hr tablet Take 1 tablet (2.5 mg total) by mouth daily with breakfast. 08/15/15   Margarita Rana, MD  glucose blood test strip BAYER CONTOUR TEST (In Vitro Strip)  1 (one) Strip Strip to check blood sugar daily for 0 days  Quantity: 50;  Refills: 5   Ordered :20-May-2013  Gerald Leitz ;  Started 20-May-2013 Active Comments: & Lancets 05/20/13  Historical Provider, MD  lisinopril-hydrochlorothiazide (PRINZIDE,ZESTORETIC) 10-12.5 MG tablet 1 (ONE) TABLET, ORAL, DAILY 08/31/15   Margarita Rana, MD  metFORMIN (GLUCOPHAGE) 500 MG tablet Take 1 tablet (500 mg total) by mouth 2 (two) times daily with a meal. 08/15/15   Margarita Rana, MD  Multiple Vitamin tablet Take by mouth. Reported on 03/13/2015    Historical Provider, MD  Multiple Vitamins-Minerals  (ZINC PO) Take by mouth.    Historical Provider, MD  OMEGA-3 FATTY ACIDS PO Take by mouth.    Historical Provider, MD  simvastatin (ZOCOR) 20 MG tablet TAKE 1 TABLET BY MOUTH AT BEDTIME 05/14/15   Margarita Rana, MD  traMADol (ULTRAM) 50 MG tablet Take 1 tablet (50 mg total) by mouth every 8 (eight) hours as needed. 10/23/15   Vickki Muff Chrismon, PA    Allergies  Allergen Reactions  . Morphine Sulfate Other (See Comments)    BP bottoms out  . Penicillins Diarrhea    Has patient had a PCN reaction causing immediate rash, facial/tongue/throat swelling, SOB or lightheadedness with hypotension: Yes Has patient had a PCN reaction causing severe rash involving mucus membranes or skin necrosis: Yes Has patient had a PCN reaction that required hospitalization No Has patient had a PCN reaction occurring within the last 10 years: No If all of the above answers are "NO", then may proceed with Cephalosporin use.    Family History  Problem Relation Age of Onset  . Hypertension Mother   . Diabetes Mother   . Lung cancer Father     Social History Social History  Substance Use Topics  . Smoking status: Former Smoker    Quit date: 03/09/1978  . Smokeless tobacco: Never Used  . Alcohol use Yes     Comment: occasional    Review of Systems Constitutional: Negative for fever. Cardiovascular: Negative for chest pain. Respiratory: Negative for shortness of breath. Gastrointestinal: Negative for abdominal pain. Positive for nausea. Neurological: Negative for headache 10-point ROS otherwise negative.  ____________________________________________   PHYSICAL EXAM:  VITAL SIGNS: ED Triage Vitals  Enc Vitals Group     BP 11/21/15 1303 121/62     Pulse Rate 11/21/15 1303 61     Resp 11/21/15 1303 19     Temp 11/21/15 1303 97.8 F (36.6 C)     Temp Source 11/21/15 1303 Oral     SpO2 11/21/15 1303 94 %     Weight 11/21/15 1306 170 lb (77.1 kg)     Height 11/21/15 1306 5' 1.5" (1.562 m)      Head Circumference --      Peak Flow --      Pain Score --      Pain Loc --      Pain Edu? --      Excl. in [place]? --     Constitutional: Alert and oriented. Well appearing and in no distress. Eyes: Normal exam ENT   Head: Normocephalic and atraumatic   Mouth/Throat: Mucous membranes are moist. Cardiovascular: Normal rate, regular rhythm. No murmur Respiratory: Normal respiratory effort without tachypnea nor retractions. Breath sounds are clear  Gastrointestinal: Soft and nontender. No distention. Musculoskeletal: Nontender with normal range of motion in all extremities.  Neurologic:  Normal speech and language. No gross focal neurologic deficits Skin:  Skin is warm,, slight diaphoresis. Psychiatric: Mood and affect are normal. Speech and behavior are normal.   ____________________________________________    EKG  EKG reviewed and interpreted by myself shows normal sinus rhythm at 61 bpm, narrow QRS,  normal axis, normal intervals, nonspecific ST changes. Slight inferior ST elevation, less than 1 mm. No reciprocal changes.  ____________________________________________    INITIAL IMPRESSION / ASSESSMENT AND PLAN / ED COURSE  Pertinent labs & imaging results that were available during my care of the patient were reviewed by me and considered in my medical decision making (see chart for details).  The patient presents the emergency department with multiple syncopal episodes, moderate nausea and diaphoresis. Currently the patient appears well with normal vitals, denies any chest pain or abdominal pain. We will check labs, very carefully monitor the patient on telemetry we will also IV hydrate while awaiting lab results. EKG slightly abnormal we will repeat an EKG.  Work up is largely normal. Troponin is negative. Urinalysis is negative. Given the patient's syncopal episodes without cause significant nausea or diaphoresis, we'll admit the patient to telemetry for further treatment  and evaluation.  ____________________________________________   FINAL CLINICAL IMPRESSION(S) / ED DIAGNOSES  Syncope    Harvest Dark, MD 11/21/15 1539

## 2015-11-21 NOTE — Progress Notes (Signed)
Patient admitted to 2A from Ed, alert and oriented x3 able to make needs known. Tele monitor on and called to CCMD, verified with Maddie RN. Resident denies pain/discomfort at this time. Will continues to monitor.

## 2015-11-21 NOTE — ED Triage Notes (Addendum)
Patient presents to ER with c/o nausea and being very tired. Daughter reports "I saw her convulse in the passenger seat and then wanted to go to sleep. The convulsion only lasted about 10 seconds and was diaphoretic right after" Denies HX of seizures. Patient is very lethargic in triage and feels nauseas. Pt has HX of bells palsy and facial droop on L side is normal per daughter. Denies any new medications. Patient has clear speech and strong bilateral hand grips.

## 2015-11-22 ENCOUNTER — Observation Stay: Payer: Medicare Other

## 2015-11-22 ENCOUNTER — Encounter: Payer: Self-pay | Admitting: Student

## 2015-11-22 ENCOUNTER — Observation Stay (HOSPITAL_BASED_OUTPATIENT_CLINIC_OR_DEPARTMENT_OTHER): Payer: Medicare Other

## 2015-11-22 DIAGNOSIS — R55 Syncope and collapse: Secondary | ICD-10-CM | POA: Diagnosis not present

## 2015-11-22 DIAGNOSIS — I10 Essential (primary) hypertension: Secondary | ICD-10-CM

## 2015-11-22 DIAGNOSIS — R569 Unspecified convulsions: Secondary | ICD-10-CM

## 2015-11-22 LAB — MICROSCOPIC EXAMINATION: CASTS: NONE SEEN /LPF

## 2015-11-22 LAB — COMPREHENSIVE METABOLIC PANEL
ALK PHOS: 46 IU/L (ref 39–117)
ALT: 25 IU/L (ref 0–32)
AST: 18 IU/L (ref 0–40)
Albumin/Globulin Ratio: 1.9 (ref 1.2–2.2)
Albumin: 4.2 g/dL (ref 3.5–4.7)
BILIRUBIN TOTAL: 0.7 mg/dL (ref 0.0–1.2)
BUN/Creatinine Ratio: 24 (ref 12–28)
BUN: 24 mg/dL (ref 8–27)
CHLORIDE: 100 mmol/L (ref 96–106)
CO2: 24 mmol/L (ref 18–29)
CREATININE: 1 mg/dL (ref 0.57–1.00)
Calcium: 9.8 mg/dL (ref 8.7–10.3)
GFR calc Af Amer: 61 mL/min/{1.73_m2} (ref >59)
GFR calc non Af Amer: 53 mL/min/{1.73_m2} — ABNORMAL LOW (ref >59)
GLUCOSE: 119 mg/dL — AB (ref 65–99)
Globulin, Total: 2.2 g/dL (ref 1.5–4.5)
Potassium: 4.4 mmol/L (ref 3.5–5.2)
Sodium: 142 mmol/L (ref 134–144)
Total Protein: 6.4 g/dL (ref 6.0–8.5)

## 2015-11-22 LAB — BASIC METABOLIC PANEL
ANION GAP: 3 — AB (ref 5–15)
BUN: 25 mg/dL — ABNORMAL HIGH (ref 6–20)
CALCIUM: 8.7 mg/dL — AB (ref 8.9–10.3)
CO2: 28 mmol/L (ref 22–32)
Chloride: 108 mmol/L (ref 101–111)
Creatinine, Ser: 0.94 mg/dL (ref 0.44–1.00)
GFR calc non Af Amer: 55 mL/min — ABNORMAL LOW (ref >60)
GLUCOSE: 113 mg/dL — AB (ref 65–99)
POTASSIUM: 3.8 mmol/L (ref 3.5–5.1)
Sodium: 139 mmol/L (ref 135–145)

## 2015-11-22 LAB — CBC WITH DIFFERENTIAL/PLATELET
BASOS ABS: 0 10*3/uL (ref 0.0–0.2)
BASOS: 0 %
EOS (ABSOLUTE): 0.1 10*3/uL (ref 0.0–0.4)
Eos: 2 %
Hematocrit: 38.6 % (ref 34.0–46.6)
Hemoglobin: 13.2 g/dL (ref 11.1–15.9)
IMMATURE GRANS (ABS): 0.1 10*3/uL (ref 0.0–0.1)
IMMATURE GRANULOCYTES: 1 %
LYMPHS: 51 %
Lymphocytes Absolute: 4.5 10*3/uL — ABNORMAL HIGH (ref 0.7–3.1)
MCH: 30.8 pg (ref 26.6–33.0)
MCHC: 34.2 g/dL (ref 31.5–35.7)
MCV: 90 fL (ref 79–97)
MONOS ABS: 1 10*3/uL — AB (ref 0.1–0.9)
Monocytes: 12 %
NEUTROS PCT: 34 %
Neutrophils Absolute: 3 10*3/uL (ref 1.4–7.0)
PLATELETS: 320 10*3/uL (ref 150–379)
RBC: 4.28 x10E6/uL (ref 3.77–5.28)
RDW: 13.4 % (ref 12.3–15.4)
WBC: 8.8 10*3/uL (ref 3.4–10.8)

## 2015-11-22 LAB — ECHOCARDIOGRAM COMPLETE
HEIGHTINCHES: 61.5 in
WEIGHTICAEL: 2720 [oz_av]

## 2015-11-22 LAB — CBC
HEMATOCRIT: 35.3 % (ref 35.0–47.0)
HEMOGLOBIN: 12.2 g/dL (ref 12.0–16.0)
MCH: 31.1 pg (ref 26.0–34.0)
MCHC: 34.5 g/dL (ref 32.0–36.0)
MCV: 90.2 fL (ref 80.0–100.0)
Platelets: 253 10*3/uL (ref 150–440)
RBC: 3.91 MIL/uL (ref 3.80–5.20)
RDW: 13.4 % (ref 11.5–14.5)
WBC: 8.7 10*3/uL (ref 3.6–11.0)

## 2015-11-22 LAB — URINALYSIS, ROUTINE W REFLEX MICROSCOPIC
BILIRUBIN UA: NEGATIVE
GLUCOSE, UA: NEGATIVE
KETONES UA: NEGATIVE
NITRITE UA: NEGATIVE
Protein, UA: NEGATIVE
RBC UA: NEGATIVE
SPEC GRAV UA: 1.017 (ref 1.005–1.030)
UUROB: 0.2 mg/dL (ref 0.2–1.0)
pH, UA: 6 (ref 5.0–7.5)

## 2015-11-22 LAB — GLUCOSE, CAPILLARY
Glucose-Capillary: 100 mg/dL — ABNORMAL HIGH (ref 65–99)
Glucose-Capillary: 134 mg/dL — ABNORMAL HIGH (ref 65–99)

## 2015-11-22 LAB — TROPONIN I: Troponin I: <0.03 ng/mL (ref <0.03)

## 2015-11-22 MED ORDER — LISINOPRIL 10 MG PO TABS: 10.0000 mg | ORAL_TABLET | Freq: Every day | ORAL | 2 refills | Status: DC

## 2015-11-22 MED ORDER — LORAZEPAM 1 MG PO TABS
1.0000 mg | ORAL_TABLET | Freq: Once | ORAL | Status: AC
  Administered 2015-11-22: 1 mg via ORAL
  Filled 2015-11-22: qty 1

## 2015-11-22 MED ORDER — GADOBENATE DIMEGLUMINE 529 MG/ML IV SOLN
15.0000 mL | Freq: Once | INTRAVENOUS | Status: AC | PRN
  Administered 2015-11-22: 15 mL via INTRAVENOUS

## 2015-11-22 MED ORDER — FUROSEMIDE 20 MG PO TABS: ORAL_TABLET | ORAL | 0 refills | Status: DC

## 2015-11-22 NOTE — Care Management (Signed)
Patient placed in observation for episode of syncope vs seizure..  She lives alone.  Independent in all adls, denies issues accessing medical care, obtaining medications or with transportation.  Current with her PCP. She is receiving some physical therapy at home but does not know the name of the agency.  She thinks the agency is [facility] but "they changed their name".   Neuro consult and brain MRI pending

## 2015-11-22 NOTE — Progress Notes (Signed)
MRI contacted primary RN and stated patient may have some claustrophobia for brain MRI. Patient stated she is nervous about having to put a "contraption on her head". Dr. Tressia Miners placed a one time ativan order. Called MRI back and stated they are not getting the patient right now, but they will call before. Will wait to give medication until test will be performed.

## 2015-11-22 NOTE — Discharge Summary (Signed)
[place] [facility] [place] NAME: [name]    MR#:  [id number]  DATE OF BIRTH:  06/15/1931  DATE OF ADMISSION:  11/21/2015   ADMITTING PHYSICIAN: Hillary Bow, MD  DATE OF DISCHARGE: 11/22/15  PRIMARY CARE PHYSICIAN: [name], PA   ADMISSION DIAGNOSIS:   Seizure ([place]) [R56.9] Syncope, unspecified syncope type [R55]  DISCHARGE DIAGNOSIS:   Principal Problem:   Syncope Active Problems:   Essential (primary) hypertension   Seizure ([place])   Faintness   SECONDARY DIAGNOSIS:   Past Medical History:  Diagnosis Date  . Diabetes mellitus without complication ([place])   . Diverticulitis   . History of echocardiogram    a. 11/2015: echo showing EF of 60-65% with no WMA. Grade 1 DD and mild MR noted.   . Hypertension   . Syncope    a. initially occurring in Fall 2016 b. Hospitalized in 10/2015 and 11/2015 for recurrent episodes.     HOSPITAL COURSE:    82 year old female with past medical history significant for hypertension, diabetes mellitus presents to the hospital secondary to syncopal episode.  #1 syncope-likely vasovagal syncope - due to repeated episodes and concern for seizure like activity- agree with further work up - MRI brain Done with and without contrast which did not show any acute abnormality, -EEG done and is pending at this time. Carotid Dopplers without any hemodynamically significant stenosis - -Appreciate neurology consult. No indication for any antiepileptics - No arrhythmias noted on telemetry, ECHO normal and appreciate cards consult- recommended 30 day monitor- will be set up as outpatient - Physical therapy eval recommended outpatient therapy - No dehydration or infection identified  #2 hypertension-stable. Continue lisinopril and hold  hydrochlorothiazide  #3 diabetes mellitus-on glipizide and metformin.  Discharge today   DISCHARGE CONDITIONS:   Stable  CONSULTS OBTAINED:    Treatment Team:  Minna Merritts, MD Leotis Pain, MD Catarina Hartshorn, MD  DRUG ALLERGIES:   Allergies  Allergen Reactions  . Morphine Sulfate Other (See Comments)    BP bottoms out  . Penicillins Diarrhea    Has patient had a PCN reaction causing immediate rash, facial/tongue/throat swelling, SOB or lightheadedness with hypotension: Yes Has patient had a PCN reaction causing severe rash involving mucus membranes or skin necrosis: Yes Has patient had a PCN reaction that required hospitalization No Has patient had a PCN reaction occurring within the last 10 years: No If all of the above answers are "NO", then may proceed with Cephalosporin use.   DISCHARGE MEDICATIONS:     Medication List    STOP taking these medications   lisinopril-hydrochlorothiazide 10-12.5 MG tablet Commonly known as:  PRINZIDE,ZESTORETIC     TAKE these medications   aspirin 81 MG tablet Take 81 mg by mouth daily.   budesonide 3 MG 24 hr capsule Commonly known as:  ENTOCORT EC TAKE 3  CAPSULES  ORAL, DAILY   furosemide 20 MG tablet Commonly known as:  LASIX TAKE ONE TABLET BY MOUTH DAILY PRN leg swelling What changed:  See the new instructions.   glipiZIDE 2.5 MG 24 hr tablet Commonly known as:  GLUCOTROL XL Take 1 tablet (2.5 mg total) by mouth daily with breakfast.   lisinopril 10 MG tablet Commonly known as:  PRINIVIL,ZESTRIL Take 1 tablet (10 mg total) by mouth daily. Start taking on:  11/23/2015   metFORMIN 500 MG tablet Commonly known as:  GLUCOPHAGE Take 1 tablet (500 mg total) by mouth 2 (two) times daily  with a meal.   Multiple Vitamin tablet Take 1 tablet by mouth every evening. Reported on 03/13/2015   simvastatin 20 MG tablet Commonly known as:  ZOCOR TAKE 1 TABLET BY MOUTH AT BEDTIME   VITAMIN A OP Apply 1 tablet to eye daily.   VITAMIN C PO Take 1 tablet by mouth daily.   Vitamin D 2000 units Caps Take 1 capsule by mouth daily.   ZINC PO Take 1 tablet by  mouth daily.        DISCHARGE INSTRUCTIONS:   1. Cardiology f/u in 2-3 days for 30 day monitor 2. PCP f/u in 1 week  DIET:   Cardiac diet  ACTIVITY:   Activity as tolerated  OXYGEN:   Home Oxygen: No.  Oxygen Delivery: room air  DISCHARGE LOCATION:   home   If you experience worsening of your admission symptoms, develop shortness of breath, life threatening emergency, suicidal or homicidal thoughts you must seek medical attention immediately by calling 911 or calling your MD immediately  if symptoms less severe.  You Must read complete instructions/literature along with all the possible adverse reactions/side effects for all the Medicines you take and that have been prescribed to you. Take any new Medicines after you have completely understood and accpet all the possible adverse reactions/side effects.   Please note  You were cared for by a hospitalist during your hospital stay. If you have any questions about your discharge medications or the care you received while you were in the hospital after you are discharged, you can call the unit and asked to speak with the hospitalist on call if the hospitalist that took care of you is not available. Once you are discharged, your primary care physician will handle any further medical issues. Please note that NO REFILLS for any discharge medications will be authorized once you are discharged, as it is imperative that you return to your primary care physician (or establish a relationship with a primary care physician if you do not have one) for your aftercare needs so that they can reassess your need for medications and monitor your lab values.    On the day of Discharge:  VITAL SIGNS:   Blood pressure (!) 127/59, pulse (!) 122, temperature 98 F (36.7 C), temperature source Oral, resp. rate 16, height 5' 1.5" (1.562 m), weight 77.6 kg (171 lb), SpO2 100 %.  PHYSICAL EXAMINATION:   GENERAL:  82 y.o.-year-old patient lying in the  bed with no acute distress.  EYES: Pupils equal, round, reactive to light and accommodation. No scleral icterus. Extraocular muscles intact.  HEENT: Head atraumatic, normocephalic. Oropharynx and nasopharynx clear.  NECK:  Supple, no jugular venous distention. No thyroid enlargement, no tenderness.  LUNGS: Normal breath sounds bilaterally, no wheezing, rales,rhonchi or crepitation. No use of accessory muscles of respiration.  CARDIOVASCULAR: S1, S2 normal. No rubs, or gallops. 2/6 systolic murmur present. ABDOMEN: Soft, nontender, nondistended. Bowel sounds present. No organomegaly or mass.  EXTREMITIES: No pedal edema, cyanosis, or clubbing.  NEUROLOGIC: Cranial nerves II through XII are intact. Muscle strength 5/5 in all extremities. Sensation intact. Gait not checked.  PSYCHIATRIC: The patient is alert and oriented x 3.  SKIN: No obvious rash, lesion, or ulcer.     DATA REVIEW:   CBC  Recent Labs Lab 11/22/15 0006  WBC 8.7  HGB 12.2  HCT 35.3  PLT 253    Chemistries   Recent Labs Lab 11/21/15 1259 11/22/15 0006  NA 139 139  K 4.1  3.8  CL 104 108  CO2 24 28  GLUCOSE 173* 113*  BUN 28* 25*  CREATININE 1.08* 0.94  CALCIUM 9.6 8.7*  AST 28  --   ALT 31  --   ALKPHOS 42  --   BILITOT 0.5  --      Microbiology Results  Results for orders placed or performed in visit on 11/13/15  Microscopic Examination     Status: None   Collection Time: 11/21/15  9:19 AM  Result Value Ref Range Status   WBC, UA 0-5 0 - 5 /hpf Final   RBC, UA 0-2 0 - 2 /hpf Final   Epithelial Cells (non renal) 0-10 0 - 10 /hpf Final   Casts None seen None seen /lpf Final   Mucus, UA Present Not Estab. Final   Bacteria, UA Few None seen/Few Final    RADIOLOGY:  Mr Jeri Cos Wo Contrast  Result Date: 11/22/2015 CLINICAL DATA:  Seizures. Prior syncopal episode. Diabetes and hypertension. Subsequent encounter. EXAM: MRI HEAD WITHOUT AND WITH CONTRAST TECHNIQUE: Multiplanar, multiecho pulse  sequences of the brain and surrounding structures were obtained without and with intravenous contrast. CONTRAST:  45mL MULTIHANCE GADOBENATE DIMEGLUMINE 529 MG/ML IV SOLN COMPARISON:  CT head 10/26/2015. FINDINGS: No evidence for acute infarction, hemorrhage, mass lesion, or extra-axial fluid. For global atrophy. Hydrocephalus ex vacuo. Mild subcortical and periventricular T2 and FLAIR hyperintensities, likely chronic microvascular ischemic change. Flow voids are maintained throughout the carotid, basilar, and vertebral arteries. Tiny focus of chronic hemorrhage RIGHT thalamus likely related to hypertensive vascular disease. Thin-section imaging through the temporal lobes demonstrates no asymmetry or mass. No inflammatory process. Post infusion, no abnormal enhancement of the brain or meninges. Normal pituitary and cerebellar tonsils. Cervical spondylosis suspected at C4-C5. Visualized calvarium, skull base, and upper cervical osseous structures unremarkable. Orbits sinuses, and mastoids show no acute process. RIGHT cataract extraction. Resolving midline frontal scalp hematoma. Compared with recent CT, no change. IMPRESSION: No acute intracranial findings.  Atrophy and small vessel disease. No abnormal postcontrast enhancement Aside from symmetric atrophy, unremarkable temporal lobes. Electronically Signed   By: Staci Righter M.D.   On: 11/22/2015 12:26   US Carotid Bilateral  Result Date: 11/22/2015 CLINICAL DATA:  Syncope. EXAM: BILATERAL CAROTID DUPLEX ULTRASOUND TECHNIQUE: Pearline Cables scale imaging, color Doppler and duplex ultrasound were performed of bilateral carotid and vertebral arteries in the neck. COMPARISON:  None. FINDINGS: Criteria: Quantification of carotid stenosis is based on velocity parameters that correlate the residual internal carotid diameter with NASCET-based stenosis levels, using the diameter of the distal internal carotid lumen as the denominator for stenosis measurement. The following  velocity measurements were obtained: RIGHT ICA:  65/13 cm/sec CCA:  [id number] cm/sec SYSTOLIC ICA/CCA RATIO:  0.8 DIASTOLIC ICA/CCA RATIO:  1.1 ECA:  124 cm/sec LEFT ICA:  80/20 cm/sec CCA:  [id number] cm/sec SYSTOLIC ICA/CCA RATIO:  1.0 DIASTOLIC ICA/CCA RATIO:  2.6 ECA:  85 cm/sec RIGHT CAROTID ARTERY: No significant plaque or stenosis is noted. RIGHT VERTEBRAL ARTERY:  Antegrade flow is noted. LEFT CAROTID ARTERY:  No significant plaque or stenosis is noted. LEFT VERTEBRAL ARTERY:  Antegrade flow is noted. IMPRESSION: No hemodynamically significant stenosis or plaque is noted in either cervical carotid artery. Electronically Signed   By: Marijo Conception, M.D.   On: 11/22/2015 14:17     Management plans discussed with the patient, family and they are in agreement.  CODE STATUS:     Code Status Orders  Start     Ordered   11/22/15 0745  Full code  Continuous     11/22/15 0744    Code Status History    Date Active Date Inactive Code Status Order ID Comments User Context   11/21/2015  3:57 PM 11/21/2015  5:53 PM Full Code [id number]  Hillary Bow, MD ED   10/26/2015  8:56 PM 10/28/2015  4:18 PM Full Code [id number]  Demetrios Loll, MD Inpatient    Advance Directive Documentation   Flowsheet Row Most Recent Value  Type of Advance Directive  Healthcare Power of Attorney, Living will  Pre-existing out of facility DNR order (yellow form or pink MOST form)  No data  "MOST" Form in Place?  No data      TOTAL TIME TAKING CARE OF THIS PATIENT: 37  minutes.    Gladstone Lighter M.D on 11/22/2015 at 2:59 PM  Between 7am to 6pm - Pager - [phone]  After 6pm go to [email or web address] - Proofreader  [facility]  Office  [phone]  CC: Primary care physician; [name], PA   Note: This dictation was prepared with Dragon dictation along with smaller phrase technology. Any transcriptional errors that result from this process are unintentional.

## 2015-11-22 NOTE — Consult Note (Signed)
Cardiology Consult    Patient ID: [name] MRN: [id number], DOB/AGE: 1932/02/16   Admit date: 11/21/2015 Date of Consult: 11/22/2015  Primary Physician: [name], PA Reason for Consult: Recurrent Syncope Primary Cardiologist: New - Dr. Rockey Situ Requesting Provider: Dr. Darvin Neighbours   History of Present Illness    [name]  is a 82 y.o. female with past medical history of Type 2 DM, HTN, HLD, and diverticulosis who presented to [facility] on 11/21/2015 for recurrent syncopal events.  Was recently admitted from 8/18 - 10/28/2015 for syncope thought to be secondary to AKI (creatinine was 2.16 on admission and improved to 0.96 at time of discharge).   The patient reports that yesterday, she was traveling in the car with her daughter when she all at once became diaphoretic and lightheaded. Says her daughter informed her she had a loss of consciousness for less than 30 seconds, accompanied by a jerking motion. The patient does not remember the episode, says she awoke and her daughter stated she was taking her to the hospital and the patient wanted to go home.  She had a similar episode while in the ED. She lost consciousness and nursing staff again noted convulsive motions, lasting less than 30 seconds. The patient remembers waking up and having several medical providers around her. Denies any chest discomfort or palpitations with the episodes. No tongue biting or bowel/bladder incontinence.   She reports the initial syncopal episode occurred in Fall 2016 after getting off a plane. She equated this to having not consumed much food. During 10/2015, the syncopal event occurred while in the restroom. She had walked in to use the restroom but awoke on the floor. Does not remember any prodromal symptoms at that time.   Initial labs show a WBC of 15.5 (improved to 8.7 this AM), Hgb 13.6, and platelets 313. K+ 4.1. Creatinine 1.08. UA with trace leukocytes. Cyclic troponin values have been  negative. EKG shows NSR, HR 61, with no acute ST or T-wave changes. Echo shows preserved EF of 60-65% with no WMA. Grade 1 DD noted along with mild MR.  MRI brain and EEG are pending.   She denies any prior cardiac history. No known MI's or cardiac arrhythmias. Telemetry shows frequent PVC's. No family history of CAD. No prior history of significant tobacco use (smoked occasionally during young adulthood, nothing recently).    Past Medical History   Past Medical History:  Diagnosis Date  . Diabetes mellitus without complication ([place])   . Diverticulitis   . Hypertension     Past Surgical History:  Procedure Laterality Date  . ABDOMINAL HYSTERECTOMY    . BACK SURGERY  08/08/2008   Lumbar discectomy  . NECK SURGERY  1980     Allergies  Allergies  Allergen Reactions  . Morphine Sulfate Other (See Comments)    BP bottoms out  . Penicillins Diarrhea    Has patient had a PCN reaction causing immediate rash, facial/tongue/throat swelling, SOB or lightheadedness with hypotension: Yes Has patient had a PCN reaction causing severe rash involving mucus membranes or skin necrosis: Yes Has patient had a PCN reaction that required hospitalization No Has patient had a PCN reaction occurring within the last 10 years: No If all of the above answers are "NO", then may proceed with Cephalosporin use.    Inpatient Medications    . aspirin EC  81 mg Oral Daily  . budesonide  3 mg Oral Daily  . enoxaparin (LOVENOX) injection  40 mg  Subcutaneous Q24H  . glipiZIDE  2.5 mg Oral Q breakfast  . lisinopril  10 mg Oral Daily   And  . hydrochlorothiazide  12.5 mg Oral Daily  . Influenza vac split quadrivalent PF  0.5 mL Intramuscular Tomorrow-1000  . insulin aspart  0-9 Units Subcutaneous TID WC  . metFORMIN  500 mg Oral BID WC  . simvastatin  20 mg Oral QHS  . sodium chloride flush  3 mL Intravenous Q12H  . sodium chloride flush  3 mL Intravenous Q12H    Family History    Family History    Problem Relation Age of Onset  . Hypertension Mother   . Diabetes Mother   . Lung cancer Father     Social History    Social History   Social History  . Marital status: Widowed    Spouse name: N/A  . Number of children: N/A  . Years of education: N/A   Occupational History  . Not on file.   Social History Main Topics  . Smoking status: Former Smoker    Quit date: 03/09/1978  . Smokeless tobacco: Never Used  . Alcohol use Yes     Comment: occasional  . Drug use: No  . Sexual activity: Not on file   Other Topics Concern  . Not on file   Social History Narrative  . No narrative on file     Review of Systems    General:  No chills, fever, night sweats or weight changes. Positive for diaphoresis.  Cardiovascular:  No chest pain, dyspnea on exertion, edema, orthopnea, palpitations, paroxysmal nocturnal dyspnea. Dermatological: No rash, lesions/masses Respiratory: No cough, dyspnea Urologic: No hematuria, dysuria Abdominal:   No nausea, vomiting, diarrhea, bright red blood per rectum, melena, or hematemesis Neurologic:  No visual changes, wkns, changes in mental status. Positive for syncope.  All other systems reviewed and are otherwise negative except as noted above.  Physical Exam    Blood pressure (!) 127/59, pulse 63, temperature 98 F (36.7 C), temperature source Oral, resp. rate 16, height 5' 1.5" (1.562 m), weight 171 lb (77.6 kg), SpO2 96 %.  General: Pleasant, elderly Caucasian female appearing in NAD Psych: Normal affect. Neuro: Alert and oriented X 3. Moves all extremities spontaneously. HEENT: Normal  Neck: Supple without bruits or JVD. Lungs:  Resp regular and unlabored, CTA without wheezing or rales. Heart: RRR no s3, s4, or murmurs. Abdomen: Soft, non-tender, non-distended, BS + x 4.  Extremities: No clubbing, cyanosis or edema. Wound dressing in place along right lower extremity.  DP/PT/Radials 2+ and equal bilaterally.  Labs    Troponin (Point  of Care Test) No results for input(s): TROPIPOC in the last 72 hours.  Recent Labs  11/21/15 1259 11/21/15 1800 11/22/15 0006  TROPONINI <0.03 <0.03 <0.03   Lab Results  Component Value Date   WBC 8.7 11/22/2015   HGB 12.2 11/22/2015   HCT 35.3 11/22/2015   MCV 90.2 11/22/2015   PLT 253 11/22/2015    Recent Labs Lab 11/21/15 1259 11/22/15 0006  NA 139 139  K 4.1 3.8  CL 104 108  CO2 24 28  BUN 28* 25*  CREATININE 1.08* 0.94  CALCIUM 9.6 8.7*  PROT 6.8  --   BILITOT 0.5  --   ALKPHOS 42  --   ALT 31  --   AST 28  --   GLUCOSE 173* 113*   Lab Results  Component Value Date   CHOL 169 08/16/2015   HDL 55  08/16/2015   [place] 85 08/16/2015   TRIG 145 08/16/2015   No results found for: [facility]   Radiology Studies    Dg Chest 2 View  Result Date: 10/26/2015 CLINICAL DATA:  Fall when state.  Weakness and fatigue. EXAM: CHEST  2 VIEW COMPARISON:  Two-view chest x-ray 05/14/2015 FINDINGS: Heart size normal. Chronic elevation left hemidiaphragm is stable. There is no edema or effusion to suggest failure. Degenerative changes of the thoracic spine are stable. IMPRESSION: No acute cardiopulmonary disease or significant interval change. Electronically Signed   By: San Morelle M.D.   On: 10/26/2015 18:59   Ct Head Wo Contrast  Result Date: 10/26/2015 CLINICAL DATA:  Syncopal episode now with bruising to the nose, orbits and forehead. EXAM: CT HEAD WITHOUT CONTRAST CT CERVICAL SPINE WITHOUT CONTRAST TECHNIQUE: Multidetector CT imaging of the head and cervical spine was performed following the standard protocol without intravenous contrast. Multiplanar CT image reconstructions of the cervical spine were also generated. COMPARISON:  None. FINDINGS: CT HEAD FINDINGS There is a minimal amount of soft tissue stranding about the midline of the forehead (image 20, series 2, sagittal image 13, series 606). This finding is without associated radiopaque foreign body or displaced  calvarial fracture. Normal noncontrast appearance of the bilateral orbits and globes. Post right-sided cataract surgery. No retrobulbar hematoma. Limited visualization of the paranasal sinuses and mastoid air cells is normal. No air-fluid levels. Mild atrophy with sulcal prominence centralized volume loss with commensurate ex vacuo dilatation the ventricular system. Scattered periventricular hypodensities compatible microvascular ischemic disease. Bilateral basal ganglial calcifications. No CT evidence of acute large territory infarct. Beam hardening beam hardening artifact involving the bilateral frontal lobe cortices, right greater than left. No definite intraparenchymal or extra-axial hemorrhage. No definite intraparenchymal or extra-axial mass on this noncontrast examination. Normal configuration of the ventricles and basilar cisterns. No midline shift. There is diffuse increased attenuation intracranial blood pool suggestive of volume depletion. CT CERVICAL SPINE FINDINGS C1 to the superior endplate of T2 There is minimal (approximately 2 mm) of anterolisthesis of C7 upon T1. The dens is normally positioned between the lateral masses of C1. Mild degenerative change of the atlantodental articulation with partial ossification of the transverse ligament. Normal atlantoaxial articulations. Presumed resection of the C7 right lamina (representative axial image 58, series 5, sagittal image 20, series 603). The bilateral facets are normally aligned. Note is made of ankylosis involving in the left C2-C3 transverse process. No fracture or static subluxation of the cervical spine. Cervical vertebral body heights are preserved. Prevertebral soft tissues are normal. Moderate multilevel cervical spine DDD, worse at C5-C6 and C6-C7 with disc space height loss, endplate irregularity and sclerosis. Regional soft tissues appear normal. No bulky cervical lymphadenopathy. Normal appearance of the thyroid gland. Limited  visualization of the lung apices is normal. IMPRESSION: Head CT Impression: 1. Minimal soft tissue swelling about the midline of the forehead without associated radiopaque foreign body, displaced calvarial fracture or acute intracranial process. 2. Advanced atrophy and mild microvascular ischemic disease. Cervical Spine CT Impression: 1. No fracture or static subluxation of the cervical spine. 2. Moderate multilevel cervical spine DDD, worse at C5-C6 and C6-C7. 3. Post presumed resection of the right C7 lamina. Electronically Signed   By: Sandi Mariscal M.D.   On: 10/26/2015 16:17    EKG & Cardiac Imaging    EKG:  NSR, HR 61, with no acute ST or T-wave changes  Echocardiogram: 11/21/2015 Study Conclusions  - Left ventricle: The cavity size was  normal. Systolic function was   normal. The estimated ejection fraction was in the range of 60%   to 65%. Wall motion was normal; there were no regional wall   motion abnormalities. Doppler parameters are consistent with   abnormal left ventricular relaxation (grade 1 diastolic   dysfunction). - Mitral valve: There was mild regurgitation. - Left atrium: The atrium was normal in size. - Right ventricle: Systolic function was normal. - Pulmonary arteries: Systolic pressure was within the normal   range.  Impressions:  - Normal study.  Assessment & Plan    1. Recurrent Syncope - presents with recurrent syncopal episodes, with the episode occurring yesterday preceded by diaphoresis and lightheadedness and lasting for less than 30 seconds. Jerking motions noted by patient's daughter. Recurrent episode occurring in the ED with convulsive motions again noted.  - no significant electrolyte abnormalities present. Cyclic troponin values have been negative. EKG shows NSR, HR 61, with no acute ST or T-wave changes. Telemetry does show frequent PVC's but no pathologic arrhythmias.  - echo shows preserved EF of 60-65% with no WMA. Grade 1 DD noted along with  mild MR. - overall, her symptoms sound most concerning for a seizure. MRI brain and EEG are pending. If Neurology workup is negative, could consider 30-day cardiac event monitor as an outpatient. Continue to monitor on telemetry while admitted.     2. HTN - BP well-controlled. - continue current medication regimen.   3. HLD - continue statin therapy.   Signed, Erma Heritage, PA-C 11/22/2015, 8:57 AM Pager: [phone]

## 2015-11-22 NOTE — Progress Notes (Signed)
Awaiting result for EEG. Called Dr. Tressia Miners to clarify and MD stated she spoke with Dr. Bynum Bellows and we do not need to wait for a result before discharging the patient. If EEG comes back positive, they can call in a prescription and notify the patient. Will discharge at this time. Paperwork has already been given to patient and daughter. IV and tele removed. Clarified with care management and no further workup needed. Education given on new prescriptions and questions answered. Daughter will take patient home now.

## 2015-11-22 NOTE — Consult Note (Signed)
Reason for Consult:syncope Referring Physician: Dr. Tressia Miners  CC: syncope vs seizure  HPI: [name] is an 82 y.o. female with a past medical history of diabetes, diverticulitis, hypertension who presents the emergency department after seizure versus syncope. According to the patient and her daughter the right and open house in an air conditioned home, when all of a sudden the patient became very diaphoretic very lightheaded and nauseated. Daughter reports approximately 15-30 seconds of loss of consciousness with what appears most consistent with hypoxic jerking. States the patient was then able to wake up and answer questions. Daughter states moderate diaphoresis. Brought the patient to the emergency department for evaluation while in the triage room the patient again had another similar episode after which she came back to her baseline.  On current examination completely at baseline.     Past Medical History:  Diagnosis Date  . Diabetes mellitus without complication ([place])   . Diverticulitis   . History of echocardiogram    a. 11/2015: echo showing EF of 60-65% with no WMA. Grade 1 DD and mild MR noted.   . Hypertension   . Syncope    a. initially occurring in Fall 2016 b. Hospitalized in 10/2015 and 11/2015 for recurrent episodes.     Past Surgical History:  Procedure Laterality Date  . ABDOMINAL HYSTERECTOMY    . BACK SURGERY  08/08/2008   Lumbar discectomy  . NECK SURGERY  1980    Family History  Problem Relation Age of Onset  . Hypertension Mother   . Diabetes Mother   . Lung cancer Father     Social History:  reports that she quit smoking about 37 years ago. She has never used smokeless tobacco. She reports that she drinks alcohol. She reports that she does not use drugs.  Allergies  Allergen Reactions  . Morphine Sulfate Other (See Comments)    BP bottoms out  . Penicillins Diarrhea    Has patient had a PCN reaction causing immediate rash,  facial/tongue/throat swelling, SOB or lightheadedness with hypotension: Yes Has patient had a PCN reaction causing severe rash involving mucus membranes or skin necrosis: Yes Has patient had a PCN reaction that required hospitalization No Has patient had a PCN reaction occurring within the last 10 years: No If all of the above answers are "NO", then may proceed with Cephalosporin use.    Medications: I have reviewed the patient's current medications.  ROS: History obtained from the patient  General ROS: negative for - chills, fatigue, fever, night sweats, weight gain or weight loss Psychological ROS: negative for - behavioral disorder, hallucinations, memory difficulties, mood swings or suicidal ideation Ophthalmic ROS: negative for - blurry vision, double vision, eye pain or loss of vision ENT ROS: negative for - epistaxis, nasal discharge, oral lesions, sore throat, tinnitus or vertigo Allergy and Immunology ROS: negative for - hives or itchy/watery eyes Hematological and Lymphatic ROS: negative for - bleeding problems, bruising or swollen lymph nodes Endocrine ROS: negative for - galactorrhea, hair pattern changes, polydipsia/polyuria or temperature intolerance Respiratory ROS: negative for - cough, hemoptysis, shortness of breath or wheezing Cardiovascular ROS: negative for - chest pain, dyspnea on exertion, edema or irregular heartbeat Gastrointestinal ROS: negative for - abdominal pain, diarrhea, hematemesis, nausea/vomiting or stool incontinence Genito-Urinary ROS: negative for - dysuria, hematuria, incontinence or urinary frequency/urgency Musculoskeletal ROS: negative for - joint swelling or muscular weakness Neurological ROS: as noted in HPI Dermatological ROS: negative for rash and skin lesion changes  Physical Examination: Blood  pressure (!) 127/59, pulse (!) 122, temperature 98 F (36.7 C), temperature source Oral, resp. rate 16, height 5' 1.5" (1.562 m), weight 77.6 kg (171  lb), SpO2 100 %.  HEENT-  Normocephalic, no lesions, without obvious abnormality.  Normal external eye and conjunctiva.  Normal TM's bilaterally.  Normal auditory canals and external ears. Normal external nose, mucus membranes and septum.  Normal pharynx. Cardiovascular- regular rate and rhythm, S1, S2 normal, no murmur, click, rub or gallop, pulses palpable throughout   Lungs- Heart exam - S1, S2 normal, no murmur, no gallop, rate regular Abdomen- soft, non-tender; bowel sounds normal; no masses,  no organomegaly Extremities- less then 2 second capillary refill Lymph-no adenopathy palpable Musculoskeletal-no joint tenderness, deformity or swelling Skin-warm and dry, no hyperpigmentation, vitiligo, or suspicious lesions  Neurological Examination Mental Status: Alert, oriented, thought content appropriate.  Speech fluent without evidence of aphasia.  Able to follow 3 step commands without difficulty. Cranial Nerves: II: Discs flat bilaterally; Visual fields grossly normal, pupils equal, round, reactive to light and accommodation III,IV, VI: ptosis not present, extra-ocular motions intact bilaterally V,VII: smile symmetric, facial light touch sensation normal bilaterally VIII: hearing normal bilaterally IX,X: gag reflex present XI: bilateral shoulder shrug XII: midline tongue extension Motor: Right : Upper extremity   5/5    Left:     Upper extremity   5/5  Lower extremity   5/5     Lower extremity   5/5 Tone and bulk:normal tone throughout; no atrophy noted Sensory: Pinprick and light touch intact throughout, bilaterally Deep Tendon Reflexes: 2+ and symmetric throughout Plantars: Right: downgoing   Left: downgoing Cerebellar: normal finger-to-nose, normal rapid alternating movements and normal heel-to-shin test Gait: normal gait and station      Laboratory Studies:   Basic Metabolic Panel:  Recent Labs Lab 11/21/15 0919 11/21/15 1259 11/22/15 0006  NA 142 139 139  K 4.4  4.1 3.8  CL 100 104 108  CO2 24 24 28   GLUCOSE 119* 173* 113*  BUN 24 28* 25*  CREATININE 1.00 1.08* 0.94  CALCIUM 9.8 9.6 8.7*    Liver Function Tests:  Recent Labs Lab 11/21/15 0919 11/21/15 1259  AST 18 28  ALT 25 31  ALKPHOS 46 42  BILITOT 0.7 0.5  PROT 6.4 6.8  ALBUMIN 4.2 4.0    Recent Labs Lab 11/21/15 1259  LIPASE 33   No results for input(s): AMMONIA in the last 168 hours.  CBC:  Recent Labs Lab 11/21/15 0919 11/21/15 1259 11/22/15 0006  WBC 8.8 15.5* 8.7  NEUTROABS 3.0  --   --   HGB  --  13.6 12.2  HCT 38.6 39.8 35.3  MCV 90 90.6 90.2  PLT 320 313 253    Cardiac Enzymes:  Recent Labs Lab 11/21/15 1259 11/21/15 1800 11/22/15 0006  TROPONINI <0.03 <0.03 <0.03    BNP: Invalid input(s): POCBNP  CBG:  Recent Labs Lab 11/21/15 1304 11/21/15 1744 11/21/15 2104 11/22/15 0818 11/22/15 1305  GLUCAP 169* 164* 136* 100* 134*    Microbiology: Results for orders placed or performed in visit on 11/13/15  Microscopic Examination     Status: None   Collection Time: 11/21/15  9:19 AM  Result Value Ref Range Status   WBC, UA 0-5 0 - 5 /hpf Final   RBC, UA 0-2 0 - 2 /hpf Final   Epithelial Cells (non renal) 0-10 0 - 10 /hpf Final   Casts None seen None seen /lpf Final   Mucus, UA Present  Not Estab. Final   Bacteria, UA Few None seen/Few Final    Coagulation Studies: No results for input(s): LABPROT, INR in the last 72 hours.  Urinalysis:  Recent Labs Lab 11/21/15 0919 11/21/15 1300  COLORURINE  --  YELLOW*  LABSPEC  --  1.016  PHURINE  --  5.0  GLUCOSEU Negative NEGATIVE  HGBUR  --  NEGATIVE  BILIRUBINUR Negative NEGATIVE  KETONESUR  --  NEGATIVE  PROTEINUR Negative NEGATIVE  NITRITE Negative NEGATIVE  LEUKOCYTESUR 1+* TRACE*    Lipid Panel:     Component Value Date/Time   CHOL 169 08/16/2015 0902   TRIG 145 08/16/2015 0902   HDL 55 08/16/2015 0902   CHOLHDL 3.1 08/16/2015 0902   LDLCALC 85 08/16/2015 0902     HgbA1C:  Lab Results  Component Value Date   HGBA1C 8.5 10/23/2015    Urine Drug Screen:  No results found for: LABOPIA, COCAINSCRNUR, LABBENZ, AMPHETMU, THCU, LABBARB  Alcohol Level: No results for input(s): ETH in the last 168 hours.   Imaging: Mr Jeri Cos [id number] Contrast  Result Date: 11/22/2015 CLINICAL DATA:  Seizures. Prior syncopal episode. Diabetes and hypertension. Subsequent encounter. EXAM: MRI HEAD WITHOUT AND WITH CONTRAST TECHNIQUE: Multiplanar, multiecho pulse sequences of the brain and surrounding structures were obtained without and with intravenous contrast. CONTRAST:  52mL MULTIHANCE GADOBENATE DIMEGLUMINE 529 MG/ML IV SOLN COMPARISON:  CT head 10/26/2015. FINDINGS: No evidence for acute infarction, hemorrhage, mass lesion, or extra-axial fluid. For global atrophy. Hydrocephalus ex vacuo. Mild subcortical and periventricular T2 and FLAIR hyperintensities, likely chronic microvascular ischemic change. Flow voids are maintained throughout the carotid, basilar, and vertebral arteries. Tiny focus of chronic hemorrhage RIGHT thalamus likely related to hypertensive vascular disease. Thin-section imaging through the temporal lobes demonstrates no asymmetry or mass. No inflammatory process. Post infusion, no abnormal enhancement of the brain or meninges. Normal pituitary and cerebellar tonsils. Cervical spondylosis suspected at C4-C5. Visualized calvarium, skull base, and upper cervical osseous structures unremarkable. Orbits sinuses, and mastoids show no acute process. RIGHT cataract extraction. Resolving midline frontal scalp hematoma. Compared with recent CT, no change. IMPRESSION: No acute intracranial findings.  Atrophy and small vessel disease. No abnormal postcontrast enhancement Aside from symmetric atrophy, unremarkable temporal lobes. Electronically Signed   By: Staci Righter M.D.   On: 11/22/2015 12:26     Assessment/Plan:  82 y.o. female with a past medical history of  diabetes, diverticulitis, hypertension who presents the emergency department after seizure versus syncope. According to the patient and her daughter the right and open house in an air conditioned home, when all of a sudden the patient became very diaphoretic very lightheaded and nauseated. Daughter reports approximately 15-30 seconds of loss of consciousness with what appears most consistent with hypoxic jerking. States the patient was then able to wake up and answer questions. Daughter states moderate diaphoresis. Brought the patient to the emergency department for evaluation while in the triage room the patient again had another similar episode after which she came back to her baseline.  On current examination completely at baseline.    MRI brain no acute abnormality noted  Pt's daughter does not state that pt is safe to live alone, would consider case management for evaluation EEG ordered. Will not start AED unless abnormal EEG Syncope work up out pt with Holter monitor D/c planning    11/22/2015, 1:33 PM

## 2015-11-22 NOTE — Care Management Obs Status (Signed)
[place] NOTIFICATION   Patient Details  Name: [name] MRN: [id number] Date of Birth: Jul 19, 1931   Medicare Observation Status Notification Given:  Yes    Katrina Stack, RN 11/22/2015, 9:03 AM

## 2015-11-22 NOTE — Progress Notes (Signed)
[place] NAME: [name]    MR#:  [id number]  DATE OF BIRTH:  [birth year]  SUBJECTIVE:  CHIEF COMPLAINT:   Chief Complaint  Patient presents with  . Nausea   - admitted with syncope- happened twice yesterday - feels normal now - cardio and neuro consults pending. For MRI today  REVIEW OF SYSTEMS:  Review of Systems  Constitutional: Negative for chills, fever and malaise/fatigue.  HENT: Negative for ear discharge, ear pain and nosebleeds.   Eyes: Negative for blurred vision and double vision.  Respiratory: Negative for cough, shortness of breath and wheezing.   Cardiovascular: Negative for chest pain, palpitations and leg swelling.  Gastrointestinal: Negative for abdominal pain, constipation, diarrhea, nausea and vomiting.  Genitourinary: Negative for dysuria and urgency.  Musculoskeletal: Negative for myalgias.  Neurological: Negative for dizziness, tingling, seizures and headaches.  Psychiatric/Behavioral: Negative for depression.    DRUG ALLERGIES:   Allergies  Allergen Reactions  . Morphine Sulfate Other (See Comments)    BP bottoms out  . Penicillins Diarrhea    Has patient had a PCN reaction causing immediate rash, facial/tongue/throat swelling, SOB or lightheadedness with hypotension: Yes Has patient had a PCN reaction causing severe rash involving mucus membranes or skin necrosis: Yes Has patient had a PCN reaction that required hospitalization No Has patient had a PCN reaction occurring within the last 10 years: No If all of the above answers are "NO", then may proceed with Cephalosporin use.    VITALS:  Blood pressure (!) 127/59, pulse 63, temperature 98 F (36.7 C), temperature source Oral, resp. rate 16, height 5' 1.5" (1.562 m), weight 77.6 kg (171 lb), SpO2 96 %.  PHYSICAL EXAMINATION:  Physical Exam  GENERAL:  82 y.o.-year-old patient lying in the bed with no acute distress.  EYES: Pupils  equal, round, reactive to light and accommodation. No scleral icterus. Extraocular muscles intact.  HEENT: Head atraumatic, normocephalic. Oropharynx and nasopharynx clear.  NECK:  Supple, no jugular venous distention. No thyroid enlargement, no tenderness.  LUNGS: Normal breath sounds bilaterally, no wheezing, rales,rhonchi or crepitation. No use of accessory muscles of respiration.  CARDIOVASCULAR: S1, S2 normal. No rubs, or gallops. 2/6 systolic murmur present. ABDOMEN: Soft, nontender, nondistended. Bowel sounds present. No organomegaly or mass.  EXTREMITIES: No pedal edema, cyanosis, or clubbing.  NEUROLOGIC: Cranial nerves II through XII are intact. Muscle strength 5/5 in all extremities. Sensation intact. Gait not checked.  PSYCHIATRIC: The patient is alert and oriented x 3.  SKIN: No obvious rash, lesion, or ulcer.    LABORATORY PANEL:   CBC  Recent Labs Lab 11/22/15 0006  WBC 8.7  HGB 12.2  HCT 35.3  PLT 253   ------------------------------------------------------------------------------------------------------------------  Chemistries   Recent Labs Lab 11/21/15 1259 11/22/15 0006  NA 139 139  K 4.1 3.8  CL 104 108  CO2 24 28  GLUCOSE 173* 113*  BUN 28* 25*  CREATININE 1.08* 0.94  CALCIUM 9.6 8.7*  AST 28  --   ALT 31  --   ALKPHOS 42  --   BILITOT 0.5  --    ------------------------------------------------------------------------------------------------------------------  Cardiac Enzymes  Recent Labs Lab 11/22/15 0006  TROPONINI <0.03   ------------------------------------------------------------------------------------------------------------------  RADIOLOGY:  No results found.  EKG:   Orders placed or performed in visit on 11/21/15  . EKG 12-Lead  . EKG 12-Lead    ASSESSMENT AND PLAN:   82 year old female with past medical history significant for hypertension, diabetes  mellitus presents to the hospital secondary to syncopal  episode.  #1 syncope-likely vasovagal syncope at least the one that she had at home with aura - due to repeated episodes and concern for seizure like activity- agree with further work up - MRI brain, neuro consult- check dopplers - monitor on tele, ECHO and cards consult - Physical therapy eval as well - No dehydration or infection identified  #2 hypertension-stable. Continue lisinopril and hydrochlorothiazide  #3 diabetes mellitus-on glipizide and metformin.  #4 DVT prophylaxis-on Lovenox.    All the records are reviewed and case discussed with Care Management/Social Workerr. Management plans discussed with the patient, family and they are in agreement.  CODE STATUS: Full Code  TOTAL TIME TAKING CARE OF THIS PATIENT: 37 minutes.   POSSIBLE D/C  TODAY or TOMORROW, DEPENDING ON CLINICAL CONDITION.   Gladstone Lighter M.D on 11/22/2015 at 9:43 AM  Between 7am to 6pm - Pager - [phone]  After 6pm go to [email or web address] - Proofreader  [facility]  Office  [phone]  CC: Primary care physician; [name], PA

## 2015-11-22 NOTE — Evaluation (Addendum)
Physical Therapy Evaluation Patient Details Name: [name] MRN: [id number] DOB: 07-06-1931 Today's Date: 11/22/2015   History of Present Illness  [name] is an 82yo white female who comes [facility] on 9/13 after LOC episode with nausea and diaphoresis, witnessed by daughter. Pt has a heeling wound on the R anterior ankle from an injury in July, and was here three weeks ago due an a similar event . She is seeing HHPT at this time.   Clinical Impression  Upon entry, the patient is received semirecumbent in bed, daughter present. The pt is awake and agreeable to participate. No acute distress noted at this time. The pt is alert and oriented x3, pleasant, conversational, and following simple and multi-step commands consistently. Functional mobility assessment demonstrates mild weakness, the pt requiring supervision and additional time/AD for transfers, and gait, however the patient performs these similarly at baseline and has been making progress with HHPT. The patient is at high risk for falls as evidence by gait speed <1.49m/s, forward reach <5", and multiple LOB demonstrated throughout session. Telemetry demonstrates some unexpected, unpredictable variation in HR from 90s to 120s without any change in activity.   Patient presenting with impairment of strength, balance, and activity tolerance, limiting ability to perform ADL and mobility tasks safely and independently. Pt reports that she feels her mobility is equal to the level of performance to 1 week ago, and complains of no acute impairments. No additional PT needs at this time. PT signing off.      Follow Up Recommendations Outpatient PT (Pt is near completed with HHPT, but is able to access commuity. Pt appropriate for Outpatient PT now and family is interested. )    Equipment Recommendations       Recommendations for Other Services       Precautions / Restrictions Precautions Precautions: None Restrictions Weight Bearing  Restrictions: No      Mobility  Bed Mobility               General bed mobility comments: Received sitting up in bed.   Transfers Overall transfer level: Needs assistance Equipment used: None Transfers: Sit to/from Stand Sit to Stand: Supervision         General transfer comment: no SPC available, but appears a safe  Ambulation/Gait Ambulation/Gait assistance: Min guard Ambulation Distance (Feet): 160 Feet Assistive device: None Gait Pattern/deviations: Drifts right/left Gait velocity: 0.72m/s Gait velocity interpretation: <1.8 ft/sec, indicative of risk for recurrent falls    Stairs            Wheelchair Mobility    Modified Rankin (Stroke Patients Only)       Balance Overall balance assessment: No apparent balance deficits (not formally assessed);Modified Independent;History of Falls                                           Pertinent Vitals/Pain Pain Assessment: No/denies pain    Home Living Family/patient expects to be discharged to:: Private residence Living Arrangements: Alone Available Help at Discharge: Family;Available PRN/intermittently Type of Home: House Home Access: Stairs to enter   [employer] of Steps: 2 in garage (s rail), 5 in back (c rail) Home Layout: One level Home Equipment: Cane - single point      Prior Function Level of Independence: Independent with assistive device(s)         Comments: Still drives, can access  intermittent limited community distances.      Hand Dominance        Extremity/Trunk Assessment                         Communication   Communication: No difficulties  Cognition Arousal/Alertness: Awake/alert Behavior During Therapy: WFL for tasks assessed/performed Overall Cognitive Status: Within Functional Limits for tasks assessed                      General Comments      Exercises        Assessment/Plan    PT Assessment Patent  does not need any further PT services  PT Diagnosis Difficulty walking;Abnormality of gait   PT Problem List Decreased strength;Decreased range of motion;Decreased balance  PT Treatment Interventions     PT Goals (Current goals can be found in the Care Plan section) Acute Rehab PT Goals Patient Stated Goal: Continue to improve indep mobility  PT Goal Formulation: All assessment and education complete, DC therapy    Frequency     Barriers to discharge        Co-evaluation               End of Session Equipment Utilized During Treatment: Gait belt Activity Tolerance: Patient tolerated treatment well;No increased pain Patient left: in bed;with call bell/phone within reach;with nursing/sitter in room;with family/visitor present      Functional Assessment Tool Used: Clinical Judgment  Functional Limitation: Mobility: Walking and moving around Mobility: Walking and Moving Around Current Status [id number]): At least 1 percent but less than 20 percent impaired, limited or restricted Mobility: Walking and Moving Around Goal Status [phone]): At least 1 percent but less than 20 percent impaired, limited or restricted Mobility: Walking and Moving Around Discharge Status [phone]): At least 1 percent but less than 20 percent impaired, limited or restricted    Time: [id number] PT Time Calculation (min) (ACUTE ONLY): 16 min   Charges:   PT Evaluation $PT Eval Moderate Complexity: 1 Procedure PT Treatments $Therapeutic Exercise: 8-22 mins   PT G Codes:   PT G-Codes **NOT FOR INPATIENT CLASS** Functional Assessment Tool Used: Clinical Judgment  Functional Limitation: Mobility: Walking and moving around Mobility: Walking and Moving Around Current Status [id number]): At least 1 percent but less than 20 percent impaired, limited or restricted Mobility: Walking and Moving Around Goal Status [phone]): At least 1 percent but less than 20 percent impaired, limited or restricted Mobility: Walking and  Moving Around Discharge Status [phone]): At least 1 percent but less than 20 percent impaired, limited or restricted    1:35 PM, 11/22/15 Etta Grandchild, PT, DPT Physical Therapist - [place] [phone] (mobile)

## 2015-11-23 ENCOUNTER — Encounter: Payer: Medicare Other | Admitting: Surgery

## 2015-11-23 ENCOUNTER — Telehealth: Payer: Self-pay | Admitting: Cardiovascular Disease

## 2015-11-23 DIAGNOSIS — E11622 Type 2 diabetes mellitus with other skin ulcer: Secondary | ICD-10-CM | POA: Diagnosis not present

## 2015-11-23 DIAGNOSIS — R55 Syncope and collapse: Secondary | ICD-10-CM

## 2015-11-23 NOTE — Telephone Encounter (Signed)
Spoke w/ pt and her daughter.  Advised them that Dr. Rockey Situ wants to have her go ahead and start wearing 30 day monitor, as we were going to place it on her before she was d/c'd from [facility]. She is agreeable and prefers the [employer]. Will enter info into [employer], they will receive a call to verify their address before the monitor is mailed to her.  She will keep appt on Tues w/ Dr. Saunders Revel.

## 2015-11-24 NOTE — Progress Notes (Addendum)
[name] ([id number]) Visit Report for 11/23/2015 Chief Complaint Document Details Patient Name: [name], [name] 11/23/2015 10:45 Date of Service: AM Medical Record [id number] Number: Patient Account Number: [id number] Feb 27, 1932 (82 y.o. Treating RN: Ahmed Prima Date of Birth/Sex: Female) Other Clinician: Primary Care Physician: [name] Treating Kyllie Pettijohn Referring Physician: [name] Physician/Extender: Weeks in Treatment: 8 Information Obtained from: Patient Chief Complaint Patient presents to the wound care center for a consult due non healing wound to the right lower extremity which she's had for about 3 weeks now Electronic Signature(s) Signed: 11/23/2015 11:23:01 AM By: Christin Fudge MD, FACS Entered By: Christin Fudge on 11/23/2015 11:23:01 [name] ([id number]) -------------------------------------------------------------------------------- HPI Details Patient Name: [name] 11/23/2015 10:45 Date of Service: AM Medical Record [id number] Number: Patient Account Number: [id number] 16-Apr-1931 (82 y.o. Treating RN: Ahmed Prima Date of Birth/Sex: Female) Other Clinician: Primary Care Physician: [name] Treating Shimon Trowbridge Referring Physician: [name] Physician/Extender: Weeks in Treatment: 8 History of Present Illness Location: lacerated wound to the right lower extremity Quality: Patient reports experiencing a dull pain to affected area(s). Severity: Patient states wound (s) are getting better. Duration: Patient has had the wound for < 3 weeks prior to presenting for treatment Timing: Pain in wound is Intermittent (comes and goes Context: The wound occurred when the patient had a blunt injury which caused a laceration to the right lower extremity Modifying Factors: Other treatment(s) tried include:was seen in the ER on 09/15/2015 and sutured with 4-0 nylon sutures. Associated Signs and  Symptoms: Patient reports having increase swelling. HPI Description: 82 year old patient who had an injury to her right lower extremity on July 8 while she was in [place] boarding a plane. She was treated at [facility] with sutures being placed on 09/15/2015. She was recently seen last week by her PCP edema of the feet and legs and after review as advised to take Lasix 20 mg daily. past hemoglobin A1c done in June 2017 was 9.1% Her past medical history is significant for diabetes mellitus, arthritis, basal cell carcinoma, collagenous colitis, essential hypertension, avitaminosis D. She is also status post abdominal hysterectomy, lumbar discectomy, neck surgery. She is a former smoker and quit in 1979. Of note in the recent past she was seen by Dr. Lucky Cowboy for what seems a sounds like injections of varicose veins. This was only around her ankles and not in the lower extremities. No Doppler studies were done. She was not advised to wear any compression stockings. 11/02/2015 -- was admitted to the hospital between 8/18 and 10/28/2015 for a syncopal attack and was found to have acute renal failure, UTI and her x-rays checked out okay. She was evaluated and discharged home on Keflex which she is completing today. 11/23/2015 -- recently discharged out of hospital after spending 2 days for a workup for seizures and unspecified syncope. MRI of the brain showed no acute abnormality and EEG is still pending and carotid Dopplers did not show any abnormality. Neurology recommended no antiepileptics and there was no arrhythmia noted on telemetry, echo was normal and a Halter monitor was recommended as an outpatient. Electronic Signature(s) Signed: 11/30/2015 10:42:32 AM By: Christin Fudge MD, FACS Previous Signature: 11/23/2015 11:25:09 AM Version By: Christin Fudge MD, FACS Entered By: Christin Fudge on 11/30/2015 10:42:32 MELAINE, MACINTOSH ([id number]) ANNI, CASSEL  ([id number]) -------------------------------------------------------------------------------- Physical Exam Details Patient Name: [name] 11/23/2015 10:45 Date of Service: AM Medical Record [id number] Number: Patient Account Number: [id number] June 18, 1931 (82 y.o. Treating RN:  Carolyne Fiscal, Debi Date of Birth/Sex: Female) Other Clinician: Primary Care Physician: Natale Milch, DENNIS Treating Britlyn Martine Referring Physician: [name] Physician/Extender: Weeks in Treatment: 8 Constitutional . Pulse regular. Respirations normal and unlabored. Afebrile. . Eyes Nonicteric. Reactive to light. Ears, Nose, Mouth, and Throat Lips, teeth, and gums WNL.Marland Kitchen Moist mucosa without lesions. Neck supple and nontender. No palpable supraclavicular or cervical adenopathy. Normal sized without goiter. Respiratory WNL. No retractions.. Breath sounds WNL, No rubs, rales, rhonchi, or wheeze.. Cardiovascular Heart rhythm and rate regular, no murmur or gallop.. Pedal Pulses WNL. No clubbing, cyanosis or edema. Lymphatic No adneopathy. No adenopathy. No adenopathy. Musculoskeletal Adexa without tenderness or enlargement.. Digits and nails w/o clubbing, cyanosis, infection, petechiae, ischemia, or inflammatory conditions.. Integumentary (Hair, Skin) No suspicious lesions. No crepitus or fluctuance. No peri-wound warmth or erythema. No masses.Marland Kitchen Psychiatric Judgement and insight Intact.. No evidence of depression, anxiety, or agitation.. Notes the wound is looking very good and no subcutaneous debridement was required today. Electronic Signature(s) Signed: 11/23/2015 11:25:43 AM By: Christin Fudge MD, FACS Entered By: Christin Fudge on 11/23/2015 11:25:42 AAISHAH, INZER ([id number]) -------------------------------------------------------------------------------- Physician Orders Details Patient Name: [name] 11/23/2015 10:45 Date of Service: AM Medical  Record [id number] Number: Patient Account Number: [id number] Mar 26, 1931 (82 y.o. Treating RN: Montey Hora Date of Birth/Sex: Female) Other Clinician: Primary Care Physician: [name] Treating Prospero Mahnke Referring Physician: [name] Physician/Extender: Suella [place] in Treatment: 8 Verbal / Phone Orders: Yes Clinician: Montey Hora Read Back and Verified: Yes Diagnosis Coding Wound Cleansing Wound #3 Right,Anterior Lower Leg o Clean wound with Normal Saline. o Cleanse wound with mild soap and water o May Shower, gently pat wound dry prior to applying new dressing. Anesthetic Wound #3 Right,Anterior Lower Leg o Topical Lidocaine 4% cream applied to wound bed prior to debridement - for clinic use Primary Wound Dressing Wound #3 Right,Anterior Lower Leg o Hydrafera Blue Secondary Dressing Wound #3 Right,Anterior Lower Leg o ABD pad o Conform/Kerlix - conform netting and tape o Non-adherent pad Dressing Change Frequency Wound #3 Right,Anterior Lower Leg o Change dressing every other day. Follow-up Appointments Wound #3 Right,Anterior Lower Leg o Return Appointment in 1 week. Additional Orders / Instructions Wound #3 Right,Anterior Lower Leg o Increase protein intake. HILDEGARD, SANON ([id number]) Medications-please add to medication list. Wound #3 Right,Anterior Lower Leg o Other: - Vitamin A, Vitamin C, Zinc, Multivitamin Notes apply for Grafix and Apligraf Electronic Signature(s) Signed: 11/23/2015 12:32:55 PM By: Christin Fudge MD, FACS Signed: 11/23/2015 5:02:11 PM By: Montey Hora Entered By: Montey Hora on 11/23/2015 11:12:38 Ambrosio, Clemens Catholic ([id number]) -------------------------------------------------------------------------------- Problem List Details Patient Name: LITZI, KEZIAH 11/23/2015 10:45 Date of Service: AM Medical Record [id number] Number: Patient Account Number: [id number] [birth year] (82 y.o.  Treating RN: Ahmed Prima Date of Birth/Sex: Female) Other Clinician: Primary Care Physician: [name] Treating Christin Fudge Referring Physician: [name] Physician/Extender: Weeks in Treatment: 8 Active Problems ICD-10 Encounter Code Description Active Date Diagnosis E11.622 Type 2 diabetes mellitus with other skin ulcer 11/09/2015 Yes S81.811A Laceration without foreign body, right lower leg, initial 11/09/2015 Yes encounter L97.212 Non-pressure chronic ulcer of right calf with fat layer 11/09/2015 Yes exposed I87.311 Chronic venous hypertension (idiopathic) with ulcer of 11/09/2015 Yes right lower extremity I89.0 Lymphedema, not elsewhere classified 11/09/2015 Yes Inactive Problems Resolved Problems Electronic Signature(s) Signed: 11/23/2015 11:22:51 AM By: Christin Fudge MD, FACS Entered By: Christin Fudge on 11/23/2015 11:22:50 Heizer, Clemens Catholic ([id number]) -------------------------------------------------------------------------------- Progress Note Details Patient Name: [name] 11/23/2015 10:45 Date of  Service: AM Medical Record [id number] Number: Patient Account Number: [id number] 05-04-1931 (82 y.o. Treating RN: Ahmed Prima Date of Birth/Sex: Female) Other Clinician: Primary Care Physician: [name] Treating Aerith Canal Referring Physician: [name] Physician/Extender: Weeks in Treatment: 8 Subjective Chief Complaint Information obtained from Patient Patient presents to the wound care center for a consult due non healing wound to the right lower extremity which she's had for about 3 weeks now History of Present Illness (HPI) The following HPI elements were documented for the patient's wound: Location: lacerated wound to the right lower extremity Quality: Patient reports experiencing a dull pain to affected area(s). Severity: Patient states wound (s) are getting better. Duration: Patient has had the wound for < 3 weeks  prior to presenting for treatment Timing: Pain in wound is Intermittent (comes and goes Context: The wound occurred when the patient had a blunt injury which caused a laceration to the right lower extremity Modifying Factors: Other treatment(s) tried include:was seen in the ER on 09/15/2015 and sutured with 4-0 nylon sutures. Associated Signs and Symptoms: Patient reports having increase swelling. 82 year old patient who had an injury to her right lower extremity on July 8 while she was in [place] boarding a plane. She was treated at [facility] with sutures being placed on 09/15/2015. She was recently seen last week by her PCP edema of the feet and legs and after review as advised to take Lasix 20 mg daily. past hemoglobin A1c done in June 2017 was 9.1% Her past medical history is significant for diabetes mellitus, arthritis, basal cell carcinoma, collagenous colitis, essential hypertension, avitaminosis D. She is also status post abdominal hysterectomy, lumbar discectomy, neck surgery. She is a former smoker and quit in 1979. Of note in the recent past she was seen by Dr. Lucky Cowboy for what seems a sounds like injections of varicose veins. This was only around her ankles and not in the lower extremities. No Doppler studies were done. She was not advised to wear any compression stockings. 11/02/2015 -- was admitted to the hospital between 8/18 and 10/28/2015 for a syncopal attack and was found to have acute renal failure, UTI and her x-rays checked out okay. She was evaluated and discharged home on Keflex which she is completing today. 11/23/2015 -- recently discharged out of hospital after spending 2 days for a workup for seizures and unspecified syncope. MRI of the brain showed no acute abnormality and EEG is still pending and carotid Jaggi, Temeca R. ([id number]) Dopplers did not show any abnormality. Neurology recommended no antiepileptics and there was no arrhythmia noted on telemetry, echo was  normal and a Halter monitor was recommended as an outpatient. Objective Constitutional Pulse regular. Respirations normal and unlabored. Afebrile. Vitals Time Taken: 10:50 AM, Height: 61 in, Weight: 170 lbs, BMI: 32.1, Temperature: 97.6 F, Pulse: 61 bpm, Respiratory Rate: 18 breaths/min, Blood Pressure: 120/52 mmHg. General Notes: letting Dr. Con Memos know about her diastolic being 52 Eyes Nonicteric. Reactive to light. Ears, Nose, Mouth, and Throat Lips, teeth, and gums WNL.Marland Kitchen Moist mucosa without lesions. Neck supple and nontender. No palpable supraclavicular or cervical adenopathy. Normal sized without goiter. Respiratory WNL. No retractions.. Breath sounds WNL, No rubs, rales, rhonchi, or wheeze.. Cardiovascular Heart rhythm and rate regular, no murmur or gallop.. Pedal Pulses WNL. No clubbing, cyanosis or edema. Lymphatic No adneopathy. No adenopathy. No adenopathy. Musculoskeletal Adexa without tenderness or enlargement.. Digits and nails w/o clubbing, cyanosis, infection, petechiae, ischemia, or inflammatory conditions.Marland Kitchen Psychiatric Judgement and insight Intact.. No evidence of depression, anxiety,  or agitation.. General Notes: the wound is looking very good and no subcutaneous debridement was required today. Integumentary (Hair, Skin) No suspicious lesions. No crepitus or fluctuance. No peri-wound warmth or erythema. No masses.. Wound #3 status is Open. Original cause of wound was Trauma. The wound is located on the Reyno, Jurnee R. ([id number]) Right,Anterior Lower Leg. The wound measures 2.8cm length x 1.3cm width x 0.2cm depth; 2.859cm^2 area and 0.572cm^3 volume. The wound is limited to skin breakdown. There is no tunneling or undermining noted. There is a large amount of serous drainage noted. The wound margin is distinct with the outline attached to the wound base. There is large (67-100%) red, pink granulation within the wound bed. There is a small (1-33%) amount of  necrotic tissue within the wound bed including Adherent Slough. The periwound skin appearance had no abnormalities noted for color. The periwound skin appearance exhibited: Localized Edema, Maceration. The periwound skin appearance did not exhibit: Callus, Crepitus, Excoriation, Fluctuance, Friable, Induration, Rash, Scarring, Dry/Scaly, Moist. Periwound temperature was noted as No Abnormality. The periwound has tenderness on palpation. Assessment Active Problems ICD-10 E11.622 - Type 2 diabetes mellitus with other skin ulcer S81.811A - Laceration without foreign body, right lower leg, initial encounter L97.212 - Non-pressure chronic ulcer of right calf with fat layer exposed I87.311 - Chronic venous hypertension (idiopathic) with ulcer of right lower extremity I89.0 - Lymphedema, not elsewhere classified Having reviewed her recent hospitalization I have assured her that the progress with the lower extremity is excellent at this stage I will continue with local care as before. I have also recommended application of a cellular or tissue base product and we will run the numbers through her insurance to see if she is eligible for this. She will see [place] back next week and continue to use her compression stockings until then Plan Wound Cleansing: Wound #3 Right,Anterior Lower Leg: Clean wound with Normal Saline. Cleanse wound with mild soap and water May Shower, gently pat wound dry prior to applying new dressing. Anesthetic: Wound #3 Right,Anterior Lower Leg: Topical Lidocaine 4% cream applied to wound bed prior to debridement - for clinic use Primary Wound Dressing: Wound #3 Right,Anterior Lower Leg: Hydrafera Blue Rezabek, Jenalyn R. ([id number]) Secondary Dressing: Wound #3 Right,Anterior Lower Leg: ABD pad Conform/Kerlix - conform netting and tape Non-adherent pad Dressing Change Frequency: Wound #3 Right,Anterior Lower Leg: Change dressing every other day. Follow-up  Appointments: Wound #3 Right,Anterior Lower Leg: Return Appointment in 1 week. Additional Orders / Instructions: Wound #3 Right,Anterior Lower Leg: Increase protein intake. Medications-please add to medication list.: Wound #3 Right,Anterior Lower Leg: Other: - Vitamin A, Vitamin C, Zinc, Multivitamin General Notes: apply for Grafix and Apligraf Having reviewed her recent hospitalization I have assured her that the progress with the lower extremity is excellent at this stage I will continue with local care as before. I have also recommended application of a cellular or tissue base product and we will run the numbers through her insurance to see if she is eligible for this. She will see [place] back next week and continue to use her compression stockings until then Electronic Signature(s) Signed: 11/30/2015 10:42:55 AM By: Christin Fudge MD, FACS Previous Signature: 11/23/2015 11:27:16 AM Version By: Christin Fudge MD, FACS Entered By: Christin Fudge on 11/30/2015 10:42:55 RHONNA, DRUGAN ([id number]) -------------------------------------------------------------------------------- SuperBill Details Patient Name: [name] Date of Service: 11/23/2015 Medical Record Number: [id number] Patient Account Number: [id number] Date of Birth/Sex: [birth year] (82 y.o. Female) Treating RN: Carolyne Fiscal, Debi Primary  Care Physician: [name] Other Clinician: Referring Physician: [name] Treating Physician/Extender: Frann Rider in Treatment: 8 Diagnosis Coding ICD-10 Codes Code Description E11.622 Type 2 diabetes mellitus with other skin ulcer S81.811A Laceration without foreign body, right lower leg, initial encounter L97.212 Non-pressure chronic ulcer of right calf with fat layer exposed I87.311 Chronic venous hypertension (idiopathic) with ulcer of right lower extremity I89.0 Lymphedema, not elsewhere classified Facility Procedures CPT4 Code: [id number] Description: [id number]  - WOUND CARE VISIT-LEV 3 EST PT Modifier: Quantity: 1 Physician Procedures CPT4 Code Description: [id number] - WC PHYS LEVEL 3 - EST PT ICD-10 Description Diagnosis E11.622 Type 2 diabetes mellitus with other skin ulcer S81.811A Laceration without foreign body, right lower leg, initi L97.212 Non-pressure chronic ulcer of  right calf with fat layer I87.311 Chronic venous hypertension (idiopathic) with ulcer of Modifier: al encounter exposed right lower Quantity: 1 extremity Electronic Signature(s) Signed: 11/23/2015 11:27:30 AM By: Christin Fudge MD, FACS Entered By: Christin Fudge on 11/23/2015 11:27:30

## 2015-11-24 NOTE — Progress Notes (Signed)
KYUNG, MUTO ([id number]) Visit Report for 11/23/2015 Arrival Information Details Patient Name: [name], [name] Date of Service: 11/23/2015 10:45 AM Medical Record Number: [id number] Patient Account Number: [id number] Date of Birth/Sex: 05-29-1931 (82 y.o. Female) Treating RN: Carolyne Fiscal, Debi Primary Care Physician: [name] Other Clinician: Referring Physician: [name] Treating Physician/Extender: Frann Rider in Treatment: 8 Visit Information History Since Last Visit All ordered tests and consults were completed: No Patient Arrived: Cane Added or deleted any medications: No Arrival Time: 10:50 Any new allergies or adverse reactions: No Accompanied By: DAUGHTER Had a fall or experienced change in No Transfer Assistance: None activities of daily living that may affect Patient Identification Verified: Yes risk of falls: Secondary Verification Process Yes Signs or symptoms of abuse/neglect since last No Completed: visito Patient Requires Transmission- No Hospitalized since last visit: No Based Precautions: Pain Present Now: No Patient Has Alerts: Yes Patient Alerts: Patient on Blood Thinner aspirin BMI 1.04 to rt lower leg Electronic Signature(s) Signed: 11/23/2015 4:43:58 PM By: Alric Quan Entered By: Alric Quan on 11/23/2015 10:50:33 [name], [name] Catholic ([id number]) -------------------------------------------------------------------------------- Clinic Level of Care Assessment Details Patient Name: [name] Date of Service: 11/23/2015 10:45 AM Medical Record Number: [id number] Patient Account Number: [id number] Date of Birth/Sex: 1931/04/14 (82 y.o. Female) Treating RN: Montey Hora Primary Care Physician: [name] Other Clinician: Referring Physician: [name] Treating Physician/Extender: Frann Rider in Treatment: 8 Clinic Level of Care Assessment Items TOOL 4 Quantity Score '[]'$  - Use when  only an EandM is performed on FOLLOW-UP visit 0 ASSESSMENTS - Nursing Assessment / Reassessment X - Reassessment of Co-morbidities (includes updates in patient status) 1 10 X - Reassessment of Adherence to Treatment Plan 1 5 ASSESSMENTS - Wound and Skin Assessment / Reassessment X - Simple Wound Assessment / Reassessment - one wound 1 5 '[]'$  - Complex Wound Assessment / Reassessment - multiple wounds 0 '[]'$  - Dermatologic / Skin Assessment (not related to wound area) 0 ASSESSMENTS - Focused Assessment '[]'$  - Circumferential Edema Measurements - multi extremities 0 '[]'$  - Nutritional Assessment / Counseling / Intervention 0 X - Lower Extremity Assessment (monofilament, tuning fork, pulses) 1 5 '[]'$  - Peripheral Arterial Disease Assessment (using hand held doppler) 0 ASSESSMENTS - Ostomy and/or Continence Assessment and Care '[]'$  - Incontinence Assessment and Management 0 '[]'$  - Ostomy Care Assessment and Management (repouching, etc.) 0 PROCESS - Coordination of Care X - Simple Patient / Family Education for ongoing care 1 15 '[]'$  - Complex (extensive) Patient / Family Education for ongoing care 0 '[]'$  - Staff obtains Programmer, systems, Records, Test Results / Process Orders 0 '[]'$  - Staff telephones HHA, Nursing Homes / Clarify orders / etc 0 '[]'$  - Routine Transfer to another Facility (non-emergent condition) 0 [name], [name] ([id number]) '[]'$  - Routine Hospital Admission (non-emergent condition) 0 '[]'$  - New Admissions / Biomedical engineer / Ordering NPWT, Apligraf, etc. 0 '[]'$  - Emergency Hospital Admission (emergent condition) 0 X - Simple Discharge Coordination 1 10 '[]'$  - Complex (extensive) Discharge Coordination 0 PROCESS - Special Needs '[]'$  - Pediatric / Minor Patient Management 0 '[]'$  - Isolation Patient Management 0 '[]'$  - Hearing / Language / Visual special needs 0 '[]'$  - Assessment of Community assistance (transportation, D/C planning, etc.) 0 '[]'$  - Additional assistance / Altered mentation 0 '[]'$  - Support  Surface(s) Assessment (bed, cushion, seat, etc.) 0 INTERVENTIONS - Wound Cleansing / Measurement X - Simple Wound Cleansing - one wound 1 5 '[]'$  - Complex Wound Cleansing - multiple wounds  0 X - Wound Imaging (photographs - any number of wounds) 1 5 '[]'$  - Wound Tracing (instead of photographs) 0 X - Simple Wound Measurement - one wound 1 5 '[]'$  - Complex Wound Measurement - multiple wounds 0 INTERVENTIONS - Wound Dressings '[]'$  - Small Wound Dressing one or multiple wounds 0 X - Medium Wound Dressing one or multiple wounds 1 15 '[]'$  - Large Wound Dressing one or multiple wounds 0 '[]'$  - Application of Medications - topical 0 '[]'$  - Application of Medications - injection 0 INTERVENTIONS - Miscellaneous '[]'$  - External ear exam 0 [name], [name] ([id number]) '[]'$  - Specimen Collection (cultures, biopsies, blood, body fluids, etc.) 0 '[]'$  - Specimen(s) / Culture(s) sent or taken to Lab for analysis 0 '[]'$  - Patient Transfer (multiple staff / Harrel Lemon Lift / Similar devices) 0 '[]'$  - Simple Staple / Suture removal (25 or less) 0 '[]'$  - Complex Staple / Suture removal (26 or more) 0 '[]'$  - Hypo / Hyperglycemic Management (close monitor of Blood Glucose) 0 '[]'$  - Ankle / Brachial Index (ABI) - do not check if billed separately 0 X - Vital Signs 1 5 Has the patient been seen at the hospital within the last three years: Yes Total Score: 85 Level Of Care: New/Established - Level 3 Electronic Signature(s) Signed: 11/23/2015 5:02:11 PM By: Montey Hora Entered By: Montey Hora on 11/23/2015 11:13:07 [name] ([id number]) -------------------------------------------------------------------------------- Encounter Discharge Information Details Patient Name: [name] Date of Service: 11/23/2015 10:45 AM Medical Record Number: [id number] Patient Account Number: [id number] Date of Birth/Sex: 05-18-1931 (82 y.o. Female) Treating RN: Carolyne Fiscal, Debi Primary Care Physician: [name] Other  Clinician: Referring Physician: [name] Treating Physician/Extender: Frann Rider in Treatment: 8 Encounter Discharge Information Items Discharge Pain Level: 0 Discharge Condition: Stable Ambulatory Status: Cane Discharge Destination: Home Transportation: Private Auto Accompanied By: daughter Schedule Follow-up Appointment: Yes Medication Reconciliation completed Yes and provided to Patient/Care Tyquisha Sharps: Provided on Clinical Summary of Care: 11/23/2015 Form Type Recipient Paper Patient [facility] Signature(s) Signed: 11/23/2015 11:21:35 AM By: Ruthine Dose Entered By: Ruthine Dose on 11/23/2015 11:21:35 [name] ([id number]) -------------------------------------------------------------------------------- Lower Extremity Assessment Details Patient Name: [name] Date of Service: 11/23/2015 10:45 AM Medical Record Number: [id number] Patient Account Number: [id number] Date of Birth/Sex: Jul 17, 1931 (82 y.o. Female) Treating RN: Carolyne Fiscal, Debi Primary Care Physician: [name] Other Clinician: Referring Physician: [name] Treating Physician/Extender: Frann Rider in Treatment: 8 Vascular Assessment Pulses: Posterior Tibial Dorsalis Pedis Palpable: [Right:Yes] Extremity colors, hair growth, and conditions: Extremity Color: [Right:Mottled] Temperature of Extremity: [Right:Warm] Capillary Refill: [Right:< 3 seconds] Toe Nail Assessment Left: Right: Thick: No Discolored: No Deformed: No Improper Length and Hygiene: No Electronic Signature(s) Signed: 11/23/2015 4:43:58 PM By: Alric Quan Entered By: Alric Quan on 11/23/2015 10:53:54 Scheerer, [name] Catholic ([id number]) -------------------------------------------------------------------------------- Multi Wound Chart Details Patient Name: [name] Date of Service: 11/23/2015 10:45 AM Medical Record Number: [id number] Patient Account Number:  [id number] Date of Birth/Sex: [birth year] (82 y.o. 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] Secondary Etiology: [3:Venous Leg Ulcer] [N/A:N/A] Comorbid History: [3:Cataracts, Hypertension, N/A Colitis, Type II Diabetes, Osteoarthritis] Date  Acquired: [3:09/15/2015] [N/A:N/A] Weeks of Treatment: [3:8] [N/A:N/A] Wound Status: [3:Open] [N/A:N/A] Measurements L x W x D 2.8x1.3x0.2 [N/A:N/A] (cm) Area (cm) : [3:2.859] [N/A:N/A] Volume (cm) : [3:0.572] [N/A:N/A] % Reduction in Area: [3:94.90%] [N/A:N/A] % Reduction in Volume: 89.90% [N/A:N/A] Classification: [3:Grade 1] [N/A:N/A] Exudate Amount: [3:Large] [N/A:N/A] Exudate Type: [3:Serous] [N/A:N/A] Exudate Color: [3:amber] [N/A:N/A] Foul Odor After [3:Yes] [N/A:N/A] Cleansing: Odor Anticipated Due to No [N/A:N/A] Product Use: Wound Margin: [3:Distinct, outline attached N/A] Granulation Amount: [3:Large (67-100%)] [N/A:N/A] Granulation Quality: [3:Red, Pink] [N/A:N/A] Necrotic Amount: [3:Small (1-33%)] [N/A:N/A] Exposed Structures: Fascia: No N/A N/A Fat: No Tendon: No Muscle: No Joint: No Bone: No Limited to Skin Breakdown Epithelialization: None N/A N/A Periwound Skin Texture: Edema: Yes N/A N/A Excoriation: No Induration: No Callus: No Crepitus: No Fluctuance: No Friable: No Rash: No Scarring: No Periwound Skin Maceration: Yes N/A N/A Moisture: Moist: No Dry/Scaly: No Periwound Skin Color: No  Abnormalities Noted N/A N/A Temperature: No Abnormality N/A N/A Tenderness on Yes N/A N/A Palpation: Wound Preparation: Ulcer Cleansing: N/A N/A Rinsed/Irrigated with Saline Topical Anesthetic Applied: Other: lidocaine 4% Treatment Notes Electronic Signature(s) Signed: 11/23/2015 5:02:11 PM By: Curtis Sites Entered By: Curtis Sites on 11/23/2015 11:11:17 [name], [name] ([id number]) -------------------------------------------------------------------------------- Multi-Disciplinary Care Plan Details Patient Name: [name] Date of Service: 11/23/2015 10:45 AM Medical Record Number: [id number] Patient Account Number: [id number] Date of Birth/Sex: 1931-11-07 (82 y.o. Female) Treating RN: Curtis Sites Primary Care Physician: Dortha Kern Other Clinician: Referring Physician: Dortha Kern Treating Physician/Extender: Rudene Re in Treatment: 8 Active Inactive Abuse / Safety / Falls / Self Care Management Nursing Diagnoses: Potential for falls Goals: Patient/caregiver will verbalize understanding of skin care regimen Date Initiated: 09/28/2015 Goal Status: Active Patient/caregiver will verbalize/demonstrate measures taken to prevent injury and/or falls Date Initiated: 09/28/2015 Goal Status: Active Interventions: Assess fall risk on admission and as needed Assess: immobility, friction, shearing, incontinence upon admission and as needed Assess impairment of mobility on admission and as needed per policy Assess self care needs on admission and as needed Provide education on fall prevention Notes: Orientation to the Wound Care Program Nursing Diagnoses: Knowledge deficit related to the wound healing center program Goals: Patient/caregiver will verbalize understanding of the Wound Healing Center Program Date Initiated: 09/28/2015 Goal Status: Active Interventions: Provide education on orientation to the wound center Notes: Lipsey, Rome R.  ([id number]) Pain, Acute or Chronic Nursing Diagnoses: Potential alteration in comfort, pain Goals: Patient will verbalize adequate pain control and receive pain control interventions during procedures as needed Date Initiated: 09/28/2015 Goal Status: Active Patient/caregiver will verbalize adequate pain control between visits Date Initiated: 09/28/2015 Goal Status: Active Patient/caregiver will verbalize comfort level met Date Initiated: 09/28/2015 Goal Status: Active Interventions: Assess comfort goal upon admission Complete pain assessment as per visit requirements Encourage patient to take pain medications as prescribed Notes: Wound/Skin Impairment Nursing Diagnoses: Impaired tissue integrity Goals: Ulcer/skin breakdown will heal within 14 weeks Date Initiated: 09/28/2015 Goal Status: Active Interventions: Assess patient/caregiver ability to obtain necessary supplies Assess patient/caregiver ability to perform ulcer/skin care regimen upon admission and as needed Provide education on ulcer and skin care Notes: Electronic Signature(s) Signed: 11/23/2015 5:02:11 PM By: Curtis Sites Entered By: Curtis Sites on 11/23/2015 11:11:06 [name], [name] ([id number]) -------------------------------------------------------------------------------- Pain Assessment Details Patient Name: [name] Date of Service: 11/23/2015 10:45 AM Medical Record Number: [id number] Patient Account Number: [id number] Date of Birth/Sex: April 10, 1931 (82 y.o. Female) Treating RN: Ashok Cordia, Debi Primary Care Physician: Dortha Kern Other Clinician: Referring Physician: Dortha Kern  Treating Physician/Extender: Frann Rider in Treatment: 8 Active Problems Location of Pain Severity and Description of Pain Patient Has Paino No Site Locations With Dressing Change: No Pain Management and Medication Current Pain Management: Electronic Signature(s) Signed: 11/23/2015 4:43:58 PM  By: Alric Quan Entered By: Alric Quan on 11/23/2015 10:50:38 [name] ([id number]) -------------------------------------------------------------------------------- Patient/Caregiver Education Details Patient Name: [name] Date of Service: 11/23/2015 10:45 AM Medical Record Number: [id number] Patient Account Number: [id number] Date of Birth/Gender: Jul 25, 1931 (82 y.o. Female) Treating RN: Carolyne Fiscal, Debi Primary Care Physician: [name] Other Clinician: Referring Physician: [name] Treating Physician/Extender: Frann Rider in Treatment: 8 Education Assessment Education Provided To: Patient Education Topics Provided Wound/Skin Impairment: Handouts: Other: change dressing as ordered Methods: Demonstration, Explain/Verbal Responses: State content correctly Electronic Signature(s) Signed: 11/23/2015 4:43:58 PM By: Alric Quan Entered By: Alric Quan on 11/23/2015 11:00:57 [name] ([id number]) -------------------------------------------------------------------------------- Wound Assessment Details Patient Name: [name] Date of Service: 11/23/2015 10:45 AM Medical Record Number: [id number] Patient Account Number: [id number] Date of Birth/Sex: 1931/05/31 (82 y.o. Female) Treating RN: Carolyne Fiscal, Debi Primary Care Physician: [name] Other Clinician: Referring Physician: [name] Treating Physician/Extender: Frann Rider in Treatment: 8 Wound Status Wound Number: 3 Primary Diabetic Wound/Ulcer of the Lower Etiology: Extremity Wound Location: Right Lower Leg - Anterior Secondary Venous Leg Ulcer Wounding Event: Trauma Etiology: Date Acquired: 09/15/2015 Wound Status: Open Weeks Of Treatment: 8 Comorbid Cataracts, Hypertension, Colitis, Clustered Wound: No History: Type II Diabetes, Osteoarthritis Photos Photo Uploaded By: Alric Quan on 11/23/2015 11:24:46 Wound  Measurements Length: (cm) 2.8 Width: (cm) 1.3 Depth: (cm) 0.2 Area: (cm) 2.859 Volume: (cm) 0.572 % Reduction in Area: 94.9% % Reduction in Volume: 89.9% Epithelialization: None Tunneling: No Undermining: No Wound Description Classification: Grade 1 Foul Odor Af Wound Margin: Distinct, outline attached Due to Produ Exudate Amount: Large Exudate Type: Serous Exudate Color: amber ter Cleansing: Yes ct Use: No Wound Bed Granulation Amount: Large (67-100%) Exposed Structure Granulation Quality: Red, Pink Fascia Exposed: No Necrotic Amount: Small (1-33%) Fat Layer Exposed: No Necrotic Quality: Adherent Slough Tendon Exposed: No [name], [name] ([id number]) Muscle Exposed: No Joint Exposed: No Bone Exposed: No Limited to Skin Breakdown Periwound Skin Texture Texture Color No Abnormalities Noted: No No Abnormalities Noted: Yes Callus: No Temperature / Pain Crepitus: No Temperature: No Abnormality Excoriation: No Tenderness on Palpation: Yes Fluctuance: No Friable: No Induration: No Localized Edema: Yes Rash: No Scarring: No Moisture No Abnormalities Noted: No Dry / Scaly: No Maceration: Yes Moist: No Wound Preparation Ulcer Cleansing: Rinsed/Irrigated with Saline Topical Anesthetic Applied: Other: lidocaine 4%, Treatment Notes Wound #3 (Right, Anterior Lower Leg) 1. Cleansed with: Clean wound with Normal Saline 2. Anesthetic Topical Lidocaine 4% cream to wound bed prior to debridement 4. Dressing Applied: Hydrafera Blue 5. Secondary Dressing Applied Dry Gauze Kerlix/Conform 7. Secured with Tape Notes netting Electronic Signature(s) Signed: 11/23/2015 4:43:58 PM By: Alric Quan Entered By: Alric Quan on 11/23/2015 10:58:39 [name], [name] ([id number]) [name], [name] ([id number]) -------------------------------------------------------------------------------- Vitals Details Patient Name: [name] Date of Service:  11/23/2015 10:45 AM Medical Record Number: [id number] Patient Account Number: [id number] Date of Birth/Sex: February 15, 1932 (82 y.o. Female) Treating RN: Carolyne Fiscal, Debi Primary Care Physician: [name] Other Clinician: Referring Physician: [name] Treating Physician/Extender: Frann Rider in Treatment: 8 Vital Signs Time Taken: 10:50 Temperature (F): 97.6 Height (in): 61 Pulse (bpm): 61 Weight (lbs): 170 Respiratory Rate (breaths/min): 18 Body Mass Index (BMI): 32.1 Blood Pressure (mmHg): 120/52 Reference Range:  80 - 120 mg / dl Notes letting Dr. Con Memos know about her diastolic being 52 Electronic Signature(s) Signed: 11/23/2015 4:43:58 PM By: Alric Quan Entered By: Alric Quan on 11/23/2015 10:52:57

## 2015-11-25 ENCOUNTER — Encounter: Payer: Self-pay | Admitting: Internal Medicine

## 2015-11-26 ENCOUNTER — Encounter (INDEPENDENT_AMBULATORY_CARE_PROVIDER_SITE_OTHER): Payer: Medicare Other

## 2015-11-26 DIAGNOSIS — R55 Syncope and collapse: Secondary | ICD-10-CM

## 2015-11-26 NOTE — Progress Notes (Signed)
Follow-up Outpatient Visit  Date: 11/27/2015  Referring Provider: Margo Common, [address], [place] [address]  Chief Complaint:  Chief Complaint  Patient presents with  . Loss of Consciousness    Hospital follow-up    HPI:  [name] is a 82 y.o. year-old female with history of diabetes mellitus and hypertension, who presents for follow-up after recent hospitalization for syncope. The patient is had to syncopal episodes over the last 1-2 months, beginning in 10/2015. At that time, she had gotten up in the morning to use the bathroom and awoke some. Later on the ground near her toilet. She does not recall passing out or the events surrounding her fall. She suffered a bruise to her forehead but otherwise did not have any significant injuries. During her admission at [facility], she was noted to have acute kidney injury felt to be due to dehydration. HCTZ was discontinued. The patient did not have any preceding symptoms that she recalls, including palpitations, lightheadedness, and chest pain.  Last week, the patient was riding in the car with her daughter when she suddenly became extremely fatigued. After about 5 minutes, she lost consciousness completely and had some convulsive movements, per the patient's daughter. She was unconscious for approximately 1 minute, after which time she seemed to return to her baseline. The patient was brought by her daughter to the [facility] emergency department, where she had a recurrent syncopal episode lasting for up to 1 minute. She was again noted to have convulsive type movements with loss of bladder control. The patient is not recall any preceding symptoms other than profound fatigue. She specifically denies lightheadedness, chest pain, and palpitations. Of note, however, while in the car she had the urge to have the air conditioner blowing on her. She also yawned repetitively prior to passing out.  Upper until August, the patient denies syncope.  She has not had prior cardiac problems but underwent right heart catheterization by Dr. Ubaldo Glassing about 8 years ago due to wheezing. She has occasional falls due to balance problems and typically ambulates with a cane. Her strength and balance have improved with physical therapy. She is currently being followed at the wound care center for wounds along the right knee and just above the ankle. The patient has intermittent dependent leg edema for which she uses furosemide about once a week. She denies orthopnea and PND.  --------------------------------------------------------------------------------------------------  Past Medical History:  Diagnosis Date  . Diabetes mellitus without complication ([place])   . Diverticulitis   . History of echocardiogram    a. 11/2015: echo showing EF of 60-65% with no WMA. Grade 1 DD and mild MR noted.   . Hypertension   . Syncope    a. initially occurring in Fall 2016 b. Hospitalized in 10/2015 and 11/2015 for recurrent episodes.     Past Surgical History:  Procedure Laterality Date  . ABDOMINAL HYSTERECTOMY    . BACK SURGERY  08/08/2008   Lumbar discectomy  . [place]    Outpatient Encounter Prescriptions as of 11/27/2015  Medication Sig  . Ascorbic Acid (VITAMIN C PO) Take 1 tablet by mouth daily.   Marland Kitchen aspirin 81 MG tablet Take 81 mg by mouth daily.   . budesonide (ENTOCORT EC) 3 MG 24 hr capsule TAKE 3  CAPSULES  ORAL, DAILY  . Cholecalciferol (VITAMIN D) 2000 UNITS CAPS Take 1 capsule by mouth daily.   . furosemide (LASIX) 20 MG tablet TAKE ONE TABLET BY MOUTH DAILY PRN leg  swelling  . glipiZIDE (GLUCOTROL XL) 2.5 MG 24 hr tablet Take 1 tablet (2.5 mg total) by mouth daily with breakfast.  . lisinopril (PRINIVIL,ZESTRIL) 10 MG tablet Take 1 tablet (10 mg total) by mouth daily.  . metFORMIN (GLUCOPHAGE) 500 MG tablet Take 1 tablet (500 mg total) by mouth 2 (two) times daily with a meal.  . Multiple Vitamin tablet Take 1 tablet by mouth every  evening. Reported on 03/13/2015  . Multiple Vitamins-Minerals (ZINC PO) Take 1 tablet by mouth daily.  . simvastatin (ZOCOR) 20 MG tablet TAKE 1 TABLET BY MOUTH AT BEDTIME  . VITAMIN A OP Apply 1 tablet to eye daily.   No facility-administered encounter medications on file as of 11/27/2015.     Allergies: Morphine sulfate and Penicillins  Social History   Social History  . Marital status: Widowed    Spouse name: N/A  . Number of children: N/A  . Years of education: N/A   Occupational History  . Not on file.   Social History Main Topics  . Smoking status: Former Smoker    Quit date: 03/09/1978  . Smokeless tobacco: Never Used  . Alcohol use 0.6 oz/week    1 Glasses of wine per week     Comment: none since hospitalization  . Drug use: No  . Sexual activity: Not on file   Other Topics Concern  . Not on file   Social History Narrative  . No narrative on file    Family History  Problem Relation Age of Onset  . Hypertension Mother   . Diabetes Mother   . Lung cancer Father     Review of Systems: A 12-system review of systems was performed and was negative except as noted in the HPI.  --------------------------------------------------------------------------------------------------  Physical Exam: BP (!) 154/84 (BP Location: Left Arm, Cuff Size: Normal)   Pulse 81   Ht 5' 1.5" (1.562 m)   Wt 171 lb 6.4 oz (77.7 kg)   BMI 31.86 kg/m   General:  Well-developed, well nourished elderly woman, seated comfortably in the exam room. She is accompanied by her daughter. HEENT: No conjunctival pallor or scleral icterus.  Moist mucous membranes.  OP clear. Neck: Supple without lymphadenopathy, thyromegaly, JVD, or HJR.  No carotid bruit. Lungs: Normal work of breathing.  Clear to auscultation bilaterally without wheezes or crackles. CV: Regular rate and rhythm without murmurs, rubs, or gallops.  Non-displaced PMI. Cardiac monitor is affixed to the left anterior chest. Abd:  Bowel sounds present.  Soft, NT/ND without hepatosplenomegaly Ext: Clean dressing is noted just above the right ankle. There is 1+ pretibial edema on the right and trace pretibial edema on the left.  Radial, PT, and DP pulses are 2+ bilaterally Skin: warm and dry without rash Neuro: CNIII-XII intact.  Strength and fine-touch sensation intact in upper and lower extremities bilaterally. Psych: Normal mood and affect.  EKG:  Artifact present.  Normal sinus rhythm without significant abnormalities.  Echo (11/21/15): Normal LV size and contraction (LVEF 60-65%), grade 1 diastolic dysfunction, mild MR.  Normal RV size and function.  Normal PA pressure.  Carotid Doppler (11/22/15): No significant stenosis or plaque in either carotid artery.  MRI brain (11/22/15): No acute intracranial findings. Atrophy and small vessel disease noted.  EEG (11/22/15): Normal.  Lab Results  Component Value Date   WBC 8.7 11/22/2015   HGB 12.2 11/22/2015   HCT 35.3 11/22/2015   MCV 90.2 11/22/2015   PLT 253 11/22/2015    Lab Results  Component Value Date   NA 139 11/22/2015   K 3.8 11/22/2015   CL 108 11/22/2015   CO2 28 11/22/2015   BUN 25 (H) 11/22/2015   CREATININE 0.94 11/22/2015   GLUCOSE 113 (H) 11/22/2015   ALT 31 11/21/2015    Lab Results  Component Value Date   CHOL 169 08/16/2015   HDL 55 08/16/2015   LDLCALC 85 08/16/2015   TRIG 145 08/16/2015   CHOLHDL 3.1 08/16/2015    --------------------------------------------------------------------------------------------------  ASSESSMENT AND PLAN: 1. Syncope, unspecified syncope type The patient is now had 3 episodes of syncope, first in mid August and then subsequently twice last week. Her daughter reports convulsive activity, though it is not entirely clear that she is having a seizure, as she does not seem to have a postictal state. Extensive workup in the hospital including echo, EEG, carotid Dopplers, and MRI brain were unrevealing.  Nonetheless, I am concerned for a possible arrhythmogenic cause of her syncope. Alternative cause would be vasovagal syncope, given episodes occurring when the patient felt very hot or was about to use the bathroom. We will continue with the 30 day event monitor that the patient is currently wearing. I have advised her not to drive or travel. She should also avoid climbing ladders or doing other activities that could result in serious injury if she were to become dizzy or pass out. If her event monitor is unrevealing, we may need to consider a myocardial perfusion stress test to exclude silent ischemia, though her lack of chest pain and shortness of breath as well as normal EKG argue against significant coronary insufficiency leading to her syncopal episodes. Ultimately, if an etiology is not found, the patient may benefit from referral to electrophysiology for loop recorder placement and/or EP study.  2. Edema, unspecified type Ms. Paisley's chronic lower extremity edema, right greater than left, with wounds currently being managed by the wound care clinic. She should continue follow with the wound care clinic as previously arranged. I have also encouraged her to wear compression stockings to help with her venous insufficiency.  Follow-up: Return to clinic in about 5 weeks to review results of event monitor.  Nelva Bush, MD 11/27/2015 4:44 PM

## 2015-11-27 ENCOUNTER — Encounter: Payer: Self-pay | Admitting: Internal Medicine

## 2015-11-27 ENCOUNTER — Ambulatory Visit (INDEPENDENT_AMBULATORY_CARE_PROVIDER_SITE_OTHER): Payer: Medicare Other | Admitting: Internal Medicine

## 2015-11-27 VITALS — BP 154/84 | HR 81 | Ht 61.5 in | Wt 171.4 lb

## 2015-11-27 DIAGNOSIS — R609 Edema, unspecified: Secondary | ICD-10-CM

## 2015-11-27 DIAGNOSIS — R55 Syncope and collapse: Secondary | ICD-10-CM | POA: Diagnosis not present

## 2015-11-27 NOTE — Patient Instructions (Signed)
Medication Instructions:  Your physician recommends that you continue on your current medications as directed. Please refer to the Current Medication list given to you today.   Labwork: none  Testing/Procedures: none  Follow-Up: Your physician recommends that you schedule a follow-up appointment in: 5 weeks with Dr. Saunders Revel.    Any Other Special Instructions Will Be Listed Below (If Applicable).     If you need a refill on your cardiac medications before your next appointment, please call your pharmacy.

## 2015-11-29 ENCOUNTER — Telehealth: Payer: Self-pay | Admitting: Family Medicine

## 2015-11-29 NOTE — Telephone Encounter (Signed)
Advise [name] to continue physical therapy twice a week for 2 weeks.

## 2015-11-29 NOTE — Telephone Encounter (Signed)
Left verbal order on confidential voicemail. 

## 2015-11-29 NOTE — Telephone Encounter (Signed)
Erin with [facility] would like to continue physical therapy for twice a week for 2 weeks starting next week. Please advise. Thanks TNP

## 2015-11-30 ENCOUNTER — Encounter: Payer: Self-pay | Admitting: Family Medicine

## 2015-11-30 ENCOUNTER — Encounter: Payer: Medicare Other | Admitting: Surgery

## 2015-11-30 ENCOUNTER — Ambulatory Visit (INDEPENDENT_AMBULATORY_CARE_PROVIDER_SITE_OTHER): Payer: Medicare Other | Admitting: Family Medicine

## 2015-11-30 VITALS — BP 92/54 | HR 64 | Temp 97.6°F | Resp 14 | Wt 172.2 lb

## 2015-11-30 DIAGNOSIS — E11622 Type 2 diabetes mellitus with other skin ulcer: Secondary | ICD-10-CM | POA: Diagnosis not present

## 2015-11-30 DIAGNOSIS — R55 Syncope and collapse: Secondary | ICD-10-CM

## 2015-11-30 DIAGNOSIS — Z23 Encounter for immunization: Secondary | ICD-10-CM | POA: Diagnosis not present

## 2015-11-30 NOTE — Progress Notes (Signed)
[name]    DOB: [birth year]   82 y.o.   MRN: [id number] Visit Date: 11/30/2015  Today's Provider: [name], PA   Chief Complaint  Patient presents with  . Hospitalization Follow-up  . Follow-up   Subjective:    HPI  Follow up Hospitalization  Patient was admitted to [facility] on 11/21/2015 and discharged on 11/22/2015. She was treated for syncope, seizure, and peripheral edema. Treatment for this included stopped Lisinopril-HCTZ 10-12.5 mg start Lisinopril 10 mg and continue Furosemide 20 mg. She reports excellent compliance with treatment. She reports this condition is stable.  ------------------------------------------------------------------------------------  Past Medical History:  Diagnosis Date  . Diabetes mellitus without complication ([place])   . Diverticulitis   . History of echocardiogram    a. 11/2015: echo showing EF of 60-65% with no WMA. Grade 1 DD and mild MR noted.   . Hypertension   . Syncope    a. initially occurring in Fall 2016 b. Hospitalized in 10/2015 and 11/2015 for recurrent episodes.    Past Surgical History:  Procedure Laterality Date  . ABDOMINAL HYSTERECTOMY    . BACK SURGERY  08/08/2008   Lumbar discectomy  . NECK SURGERY  1980   Family History  Problem Relation Age of Onset  . Hypertension Mother   . Diabetes Mother   . Lung cancer Father    Allergies  Allergen Reactions  . Morphine Sulfate Other (See Comments)    BP bottoms out  . Penicillins Diarrhea    Has patient had a PCN reaction causing immediate rash, facial/tongue/throat swelling, SOB or lightheadedness with hypotension: Yes Has patient had a PCN reaction causing severe rash involving mucus membranes or skin necrosis: Yes Has patient had a PCN reaction that required hospitalization No Has patient had a PCN reaction occurring within the last 10 years: No If all of the above answers are "NO", then may proceed with Cephalosporin use.     Previous  Medications   ASCORBIC ACID (VITAMIN C PO)    Take 1 tablet by mouth daily.    ASPIRIN 81 MG TABLET    Take 81 mg by mouth daily.    BUDESONIDE (ENTOCORT EC) 3 MG 24 HR CAPSULE    TAKE 3  CAPSULES  ORAL, DAILY   CHOLECALCIFEROL (VITAMIN D) 2000 UNITS CAPS    Take 1 capsule by mouth daily.    FUROSEMIDE (LASIX) 20 MG TABLET    TAKE ONE TABLET BY MOUTH DAILY PRN leg swelling   GLIPIZIDE (GLUCOTROL XL) 2.5 MG 24 HR TABLET    Take 1 tablet (2.5 mg total) by mouth daily with breakfast.   LISINOPRIL (PRINIVIL,ZESTRIL) 10 MG TABLET    Take 1 tablet (10 mg total) by mouth daily.   METFORMIN (GLUCOPHAGE) 500 MG TABLET    Take 1 tablet (500 mg total) by mouth 2 (two) times daily with a meal.   MULTIPLE VITAMIN TABLET    Take 1 tablet by mouth every evening. Reported on 03/13/2015   MULTIPLE VITAMINS-MINERALS (ZINC PO)    Take 1 tablet by mouth daily.   SIMVASTATIN (ZOCOR) 20 MG TABLET    TAKE 1 TABLET BY MOUTH AT BEDTIME   VITAMIN A OP    Apply 1 tablet to eye daily.    Review of Systems  Constitutional: Negative.   Respiratory: Negative.   Cardiovascular: Negative.     Social History  Substance Use Topics  . Smoking status: Former Smoker    Quit date: 03/09/1978  .  Smokeless tobacco: Never Used  . Alcohol use 0.6 oz/week    1 Glasses of wine per week     Comment: none since hospitalization   Objective:   BP (!) 92/54 (BP Location: Right Arm, Patient Position: Sitting, Cuff Size: Normal)   Pulse 64   Temp 97.6 F (36.4 C) (Oral)   Resp 14   Wt 172 lb 3.2 oz (78.1 kg)   BMI 32.01 kg/m  Repeat BP in 10-15 minutes - 122/60  Physical Exam  Constitutional: She is oriented to person, place, and time. She appears well-developed and well-nourished. No distress.  HENT:  Head: Normocephalic and atraumatic.  Right Ear: Hearing normal.  Left Ear: Hearing normal.  Nose: Nose normal.  Eyes: Conjunctivae and lids are normal. Right eye exhibits no discharge. Left eye exhibits no discharge. No  scleral icterus.  Neck: Neck supple.  Cardiovascular: Normal rate and regular rhythm.   Pulmonary/Chest: Effort normal and breath sounds normal. No respiratory distress.  Abdominal: Soft. Bowel sounds are normal.  Musculoskeletal: Normal range of motion.  Neurological: She is alert and oriented to person, place, and time.  Skin: Skin is intact. No lesion and no rash noted.  Psychiatric: She has a normal mood and affect. Her speech is normal and behavior is normal. Thought content normal.      Assessment & Plan:     1. Syncope, unspecified syncope type Hospitalized twice in the past month for syncopal episodes. No specific injury or etiology found yet. Feeling well today. Good eating habits and sleeping well. Presently has a 30 day Holter Monitor in place for cardiology evaluation. Scans and EEG's have been normal. Recommend she continue cardiology follow up and recheck as needed.  2. Need for influenza vaccination - Flu vaccine HIGH DOSE PF

## 2015-12-01 NOTE — Progress Notes (Signed)
[name], [name] ([id number]) Visit Report for 11/30/2015 Arrival Information Details Patient Name: [name], [name] Date of Service: 11/30/2015 10:45 AM Medical Record Number: [id number] Patient Account Number: [id number] Date of Birth/Sex: 07/26/1931 (82 y.o. Female) Treating RN: Carolyne Fiscal, Debi Primary Care Physician: [name] Other Clinician: Referring Physician: [name] Treating Physician/Extender: Frann Rider in Treatment: 9 Visit Information History Since Last Visit All ordered tests and consults were completed: No Patient Arrived: Cane Added or deleted any medications: No Arrival Time: 10:58 Any new allergies or adverse reactions: No Accompanied By: Derek Jack Had a fall or experienced change in No Transfer Assistance: None activities of daily living that may affect Patient Identification Verified: Yes risk of falls: Secondary Verification Process Yes Signs or symptoms of abuse/neglect since last No Completed: visito Patient Requires Transmission- No Hospitalized since last visit: No Based Precautions: Pain Present Now: No Patient Has Alerts: Yes Patient Alerts: Patient on Blood Thinner aspirin BMI 1.04 to rt lower leg Electronic Signature(s) Signed: 11/30/2015 5:24:41 PM By: Alric Quan Entered By: Alric Quan on 11/30/2015 10:59:11 [name], [name] ([id number]) -------------------------------------------------------------------------------- Clinic Level of Care Assessment Details Patient Name: [name] Date of Service: 11/30/2015 10:45 AM Medical Record Number: [id number] Patient Account Number: [id number] Date of Birth/Sex: 1931-11-06 (82 y.o. Female) Treating RN: Carolyne Fiscal, Debi Primary Care Physician: [name] Other Clinician: Referring Physician: [name] Treating Physician/Extender: Frann Rider in Treatment: 9 Clinic Level of Care Assessment Items TOOL 4 Quantity Score X - Use when  only an EandM is performed on FOLLOW-UP visit 1 0 ASSESSMENTS - Nursing Assessment / Reassessment X - Reassessment of Co-morbidities (includes updates in patient status) 1 10 X - Reassessment of Adherence to Treatment Plan 1 5 ASSESSMENTS - Wound and Skin Assessment / Reassessment X - Simple Wound Assessment / Reassessment - one wound 1 5 '[]'$  - Complex Wound Assessment / Reassessment - multiple wounds 0 '[]'$  - Dermatologic / Skin Assessment (not related to wound area) 0 ASSESSMENTS - Focused Assessment '[]'$  - Circumferential Edema Measurements - multi extremities 0 '[]'$  - Nutritional Assessment / Counseling / Intervention 0 '[]'$  - Lower Extremity Assessment (monofilament, tuning fork, pulses) 0 '[]'$  - Peripheral Arterial Disease Assessment (using hand held doppler) 0 ASSESSMENTS - Ostomy and/or Continence Assessment and Care '[]'$  - Incontinence Assessment and Management 0 '[]'$  - Ostomy Care Assessment and Management (repouching, etc.) 0 PROCESS - Coordination of Care X - Simple Patient / Family Education for ongoing care 1 15 '[]'$  - Complex (extensive) Patient / Family Education for ongoing care 0 '[]'$  - Staff obtains Programmer, systems, Records, Test Results / Process Orders 0 '[]'$  - Staff telephones HHA, Nursing Homes / Clarify orders / etc 0 '[]'$  - Routine Transfer to another Facility (non-emergent condition) 0 [name], [name] ([id number]) '[]'$  - Routine Hospital Admission (non-emergent condition) 0 '[]'$  - New Admissions / Biomedical engineer / Ordering NPWT, Apligraf, etc. 0 '[]'$  - Emergency Hospital Admission (emergent condition) 0 X - Simple Discharge Coordination 1 10 '[]'$  - Complex (extensive) Discharge Coordination 0 PROCESS - Special Needs '[]'$  - Pediatric / Minor Patient Management 0 '[]'$  - Isolation Patient Management 0 '[]'$  - Hearing / Language / Visual special needs 0 '[]'$  - Assessment of Community assistance (transportation, D/C planning, etc.) 0 '[]'$  - Additional assistance / Altered mentation 0 '[]'$  - Support  Surface(s) Assessment (bed, cushion, seat, etc.) 0 INTERVENTIONS - Wound Cleansing / Measurement X - Simple Wound Cleansing - one wound 1 5 '[]'$  - Complex Wound Cleansing - multiple wounds  0 X - Wound Imaging (photographs - any number of wounds) 1 5 '[]'$  - Wound Tracing (instead of photographs) 0 X - Simple Wound Measurement - one wound 1 5 '[]'$  - Complex Wound Measurement - multiple wounds 0 INTERVENTIONS - Wound Dressings '[]'$  - Small Wound Dressing one or multiple wounds 0 X - Medium Wound Dressing one or multiple wounds 1 15 '[]'$  - Large Wound Dressing one or multiple wounds 0 X - Application of Medications - topical 1 5 '[]'$  - Application of Medications - injection 0 INTERVENTIONS - Miscellaneous '[]'$  - External ear exam 0 [name], [name] ([id number]) '[]'$  - Specimen Collection (cultures, biopsies, blood, body fluids, etc.) 0 '[]'$  - Specimen(s) / Culture(s) sent or taken to Lab for analysis 0 '[]'$  - Patient Transfer (multiple staff / Harrel Lemon Lift / Similar devices) 0 '[]'$  - Simple Staple / Suture removal (25 or less) 0 '[]'$  - Complex Staple / Suture removal (26 or more) 0 '[]'$  - Hypo / Hyperglycemic Management (close monitor of Blood Glucose) 0 '[]'$  - Ankle / Brachial Index (ABI) - do not check if billed separately 0 X - Vital Signs 1 5 Has the patient been seen at the hospital within the last three years: Yes Total Score: 85 Level Of Care: New/Established - Level 3 Electronic Signature(s) Signed: 11/30/2015 5:24:41 PM By: Alric Quan Entered By: Alric Quan on 11/30/2015 11:43:53 [name], [name] ([id number]) -------------------------------------------------------------------------------- Encounter Discharge Information Details Patient Name: [name] Date of Service: 11/30/2015 10:45 AM Medical Record Number: [id number] Patient Account Number: [id number] Date of Birth/Sex: 1931-05-15 (82 y.o. Female) Treating RN: Carolyne Fiscal, Debi Primary Care Physician: [name] Other  Clinician: Referring Physician: [name] Treating Physician/Extender: Frann Rider in Treatment: 9 Encounter Discharge Information Items Discharge Pain Level: 0 Discharge Condition: Stable Ambulatory Status: Cane Discharge Destination: Home Transportation: Private Auto Accompanied By: daughter Schedule Follow-up Appointment: Yes Medication Reconciliation completed Yes and provided to Patient/Care Treyton Slimp: Provided on Clinical Summary of Care: 11/30/2015 Form Type Recipient Paper Patient [place] Signature(s) Signed: 11/30/2015 11:33:19 AM By: Ruthine Dose Entered By: Ruthine Dose on 11/30/2015 11:33:18 [name] ([id number]) -------------------------------------------------------------------------------- Lower Extremity Assessment Details Patient Name: [name] Date of Service: 11/30/2015 10:45 AM Medical Record Number: [id number] Patient Account Number: [id number] Date of Birth/Sex: [birth year] (82 y.o. Female) Treating RN: Carolyne Fiscal, Debi Primary Care Physician: [name] Other Clinician: Referring Physician: [name] Treating Physician/Extender: Frann Rider in Treatment: 9 Vascular Assessment Pulses: Posterior Tibial Dorsalis Pedis Palpable: [Right:Yes] Extremity colors, hair growth, and conditions: Extremity Color: [Right:Mottled] Temperature of Extremity: [Right:Warm] Capillary Refill: [Right:< 3 seconds] Toe Nail Assessment Left: Right: Thick: No Discolored: No Deformed: No Improper Length and Hygiene: No Electronic Signature(s) Signed: 11/30/2015 5:24:41 PM By: Alric Quan Entered By: Alric Quan on 11/30/2015 11:02:07 [name], [name] ([id number]) -------------------------------------------------------------------------------- Multi Wound Chart Details Patient Name: [name] Date of Service: 11/30/2015 10:45 AM Medical Record Number: [id number] Patient Account Number:  [id number] Date of Birth/Sex: 1931-11-10 (82 y.o. 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] Secondary Etiology: [3:Venous Leg Ulcer] [N/A:N/A] Comorbid History: [3:Cataracts, Hypertension, N/A Colitis, Type II Diabetes, Osteoarthritis]  Date Acquired: [3:09/15/2015] [N/A:N/A] Weeks of Treatment: [3:9] [N/A:N/A] Wound Status: [3:Open] [N/A:N/A] Measurements L x W x D 2x1x0.2 [N/A:N/A] (cm) Area (cm) : [3:1.571] [N/A:N/A] Volume (cm) : [3:0.314] [N/A:N/A] % Reduction in Area: [3:97.20%] [N/A:N/A] % Reduction in Volume: 94.40% [N/A:N/A] Classification: [3:Grade 1] [N/A:N/A] Exudate Amount: [3:Large] [N/A:N/A] Exudate Type: [3:Serous] [N/A:N/A] Exudate Color: [3:amber] [N/A:N/A] Foul Odor After [3:Yes] [N/A:N/A] Cleansing: Odor Anticipated Due to No [N/A:N/A] Product Use: Wound Margin: [3:Distinct, outline attached N/A] Granulation Amount: [3:Large (67-100%)] [N/A:N/A] Granulation Quality: [3:Red, Pink] [N/A:N/A] Necrotic Amount: [3:Small (1-33%)] [N/A:N/A] Exposed Structures: Fascia: No N/A N/A Fat: No Tendon: No Muscle: No Joint: No Bone: No Limited to Skin Breakdown Epithelialization: None N/A N/A Periwound Skin Texture: Edema: Yes N/A N/A Excoriation: No Induration: No Callus: No Crepitus: No Fluctuance: No Friable: No Rash: No Scarring: No Periwound Skin Maceration: Yes N/A N/A Moisture: Moist: No Dry/Scaly: No Periwound Skin Color: No  Abnormalities Noted N/A N/A Temperature: No Abnormality N/A N/A Tenderness on Yes N/A N/A Palpation: Wound Preparation: Ulcer Cleansing: N/A N/A Rinsed/Irrigated with Saline Topical Anesthetic Applied: Other: lidocaine 4% Treatment Notes Electronic Signature(s) Signed: 11/30/2015 5:24:41 PM By: Alric Quan Entered By: Alric Quan on 11/30/2015 11:10:09 [name], [name] ([id number]) -------------------------------------------------------------------------------- [place] Details Patient Name: [name] Date of Service: 11/30/2015 10:45 AM Medical Record Number: [id number] Patient Account Number: [id number] Date of Birth/Sex: [birth year] (82 y.o. Female) Treating RN: Carolyne Fiscal, Debi Primary Care Physician: [name] Other Clinician: Referring Physician: [name] Treating Physician/Extender: Frann Rider in Treatment: 9 Active Inactive Abuse / Safety / Falls / Self Care Management Nursing Diagnoses: Potential for falls Goals: Patient/caregiver will verbalize understanding of skin care regimen Date Initiated: 09/28/2015 Goal Status: Active Patient/caregiver will verbalize/demonstrate measures taken to prevent injury and/or falls Date Initiated: 09/28/2015 Goal Status: Active Interventions: Assess fall risk on admission and as needed Assess: immobility, friction, shearing, incontinence upon admission and as needed Assess impairment of mobility on admission and as needed per policy Assess self care needs on admission and as needed Provide education on fall prevention Notes: Orientation to the Wound Care Program Nursing Diagnoses: Knowledge deficit related to the wound healing center program Goals: Patient/caregiver will verbalize understanding of the [facility] Date Initiated: 09/28/2015 Goal Status: Active Interventions: Provide education on orientation to the wound center Notes: [name], [name]  R. ([id number]) Pain, Acute or Chronic Nursing Diagnoses: Potential alteration in comfort, pain Goals: Patient will verbalize adequate pain control and receive pain control interventions during procedures as needed Date Initiated: 09/28/2015 Goal Status: Active Patient/caregiver will verbalize adequate pain control between visits Date Initiated: 09/28/2015 Goal Status: Active Patient/caregiver will verbalize comfort level met Date Initiated: 09/28/2015 Goal Status: Active Interventions: Assess comfort goal upon admission Complete pain assessment as per visit requirements Encourage patient to take pain medications as prescribed Notes: Wound/Skin Impairment Nursing Diagnoses: Impaired tissue integrity Goals: Ulcer/skin breakdown will heal within 14 weeks Date Initiated: 09/28/2015 Goal Status: Active Interventions: Assess patient/caregiver ability to obtain necessary supplies Assess patient/caregiver ability to perform ulcer/skin care regimen upon admission and as needed Provide education on ulcer and skin care Notes: Electronic Signature(s) Signed: 11/30/2015 5:24:41 PM By: Alric Quan Entered By: Alric Quan on 11/30/2015 11:10:01 Candy, [name] ([id number]) -------------------------------------------------------------------------------- Pain Assessment Details Patient Name: [name] Date of Service: 11/30/2015 10:45 AM Medical Record Number: [id number] Patient Account Number: [id number] Date of Birth/Sex: [birth year] (82 y.o. Female) Treating RN: Carolyne Fiscal, Debi Primary Care Physician: [name] Other Clinician: Referring Physician: Natale Milch  DENNIS Treating Physician/Extender: Frann Rider in Treatment: 9 Active Problems Location of Pain Severity and Description of Pain Patient Has Paino No Site Locations With Dressing Change: No Pain Management and Medication Current Pain Management: Electronic Signature(s) Signed: 11/30/2015  5:24:41 PM By: Alric Quan Entered By: Alric Quan on 11/30/2015 10:59:19 [name] ([id number]) -------------------------------------------------------------------------------- Patient/Caregiver Education Details Patient Name: [name] Date of Service: 11/30/2015 10:45 AM Medical Record Number: [id number] Patient Account Number: [id number] Date of Birth/Gender: [birth year] (82 y.o. Female) Treating RN: Carolyne Fiscal, Debi Primary Care Physician: [name] Other Clinician: Referring Physician: [name] Treating Physician/Extender: Frann Rider in Treatment: 9 Education Assessment Education Provided To: Patient Education Topics Provided Wound/Skin Impairment: Handouts: Other: change dressing as ordered Electronic Signature(s) Signed: 11/30/2015 5:24:41 PM By: Alric Quan Entered By: Alric Quan on 11/30/2015 11:13:48 Bour, [name] ([id number]) -------------------------------------------------------------------------------- Wound Assessment Details Patient Name: [name] Date of Service: 11/30/2015 10:45 AM Medical Record Number: [id number] Patient Account Number: [id number] Date of Birth/Sex: 09/09/1931 (82 y.o. Female) Treating RN: Carolyne Fiscal, Debi Primary Care Physician: [name] Other Clinician: Referring Physician: [name] Treating Physician/Extender: Frann Rider in Treatment: 9 Wound Status Wound Number: 3 Primary Diabetic Wound/Ulcer of the Lower Etiology: Extremity Wound Location: Right Lower Leg - Anterior Secondary Venous Leg Ulcer Wounding Event: Trauma Etiology: Date Acquired: 09/15/2015 Wound Status: Open Weeks Of Treatment: 9 Comorbid Cataracts, Hypertension, Colitis, Clustered Wound: No History: Type II Diabetes, Osteoarthritis Photos Photo Uploaded By: Alric Quan on 11/30/2015 11:57:03 Wound Measurements Length: (cm) 2 Width: (cm) 1 Depth: (cm) 0.2 Area:  (cm) 1.571 Volume: (cm) 0.314 % Reduction in Area: 97.2% % Reduction in Volume: 94.4% Epithelialization: None Tunneling: No Undermining: No Wound Description Classification: Grade 1 Foul Odor Af Wound Margin: Distinct, outline attached Due to Produ Exudate Amount: Large Exudate Type: Serous Exudate Color: amber ter Cleansing: Yes ct Use: No Wound Bed Granulation Amount: Large (67-100%) Exposed Structure Granulation Quality: Red, Pink Fascia Exposed: No Necrotic Amount: Small (1-33%) Fat Layer Exposed: No Necrotic Quality: Adherent Slough Tendon Exposed: No Ewbank, Bobbe R. ([id number]) Muscle Exposed: No Joint Exposed: No Bone Exposed: No Limited to Skin Breakdown Periwound Skin Texture Texture Color No Abnormalities Noted: No No Abnormalities Noted: Yes Callus: No Temperature / Pain Crepitus: No Temperature: No Abnormality Excoriation: No Tenderness on Palpation: Yes Fluctuance: No Friable: No Induration: No Localized Edema: Yes Rash: No Scarring: No Moisture No Abnormalities Noted: No Dry / Scaly: No Maceration: Yes Moist: No Wound Preparation Ulcer Cleansing: Rinsed/Irrigated with Saline Topical Anesthetic Applied: Other: lidocaine 4%, Treatment Notes Wound #3 (Right, Anterior Lower Leg) 1. Cleansed with: Clean wound with Normal Saline 2. Anesthetic Topical Lidocaine 4% cream to wound bed prior to debridement 4. Dressing Applied: Hydrafera Blue 5. Secondary Dressing Applied ABD Pad Kerlix/Conform 7. Secured with Tape Notes netting Electronic Signature(s) Signed: 11/30/2015 5:24:41 PM By: Alric Quan Entered By: Alric Quan on 11/30/2015 11:08:40 MORGANNA, STYLES ([id number]) KEAUNNA, SKIPPER ([id number]) -------------------------------------------------------------------------------- [place] Details Patient Name: [name] Date of Service: 11/30/2015 10:45 AM Medical Record Number: [id number] Patient Account Number:  [id number] Date of Birth/Sex: 1931/11/26 (82 y.o. Female) Treating RN: Carolyne Fiscal, Debi Primary Care Physician: [name] Other Clinician: Referring Physician: [name] Treating Physician/Extender: Frann Rider in Treatment: 9 Vital Signs Time Taken: 10:59 Temperature (F): 97.6 Height (in): 61 Pulse (bpm): 58 Weight (lbs): 170 Respiratory Rate (breaths/min): 18 Body Mass Index (BMI): 32.1 Blood Pressure (mmHg): 116/53 Reference Range: 80 - 120 mg / dl  Notes Made Dr. Con Memos aware of BP and HR. Electronic Signature(s) Signed: 11/30/2015 5:24:41 PM By: Alric Quan Entered By: Alric Quan on 11/30/2015 11:01:33

## 2015-12-01 NOTE — Progress Notes (Signed)
[name], [name] ([id number]) Visit Report for 11/30/2015 Chief Complaint Document Details Patient Name: [name], [name] 11/30/2015 10:45 Date of Service: AM Medical Record [id number] Number: Patient Account Number: [id number] 1931-07-06 (82 y.o. Treating RN: [name] Date of Birth/Sex: Female) Other Clinician: Primary Care Physician: [name] Treating [name] Referring Physician: Vernie [name] Physician/Extender: [name]: 9 Information Obtained from: Patient Chief Complaint Patient presents to the wound care center for a consult due non healing wound to the right lower extremity which she's had for about 3 [name] now Electronic Signature(s) Signed: 11/30/2015 11:26:15 AM By: [name] Fudge MD, FACS Entered By: [name] on 11/30/2015 11:26:14 [name] ([id number]) -------------------------------------------------------------------------------- HPI Details Patient Name: [name] 11/30/2015 10:45 Date of Service: AM Medical Record [id number] Number: Patient Account Number: [id number] 1931-05-31 (82 y.o. Treating RN: [name] Date of Birth/Sex: Female) Other Clinician: Primary Care Physician: [name] Treating [name] Referring Physician: Vernie [name] Physician/Extender: [name]: 9 History of Present Illness Location: lacerated wound to the right lower extremity Quality: Patient reports experiencing a dull pain to affected area(s). Severity: Patient states wound (s) are getting better. Duration: Patient has had the wound for < 3 [name] prior to presenting for [name] Timing: Pain in wound is Intermittent (comes and goes Context: The wound occurred when the patient had a blunt injury which caused a laceration to the right lower extremity Modifying Factors: Other [name](s) tried include:was seen in the ER on 09/15/2015 and sutured with 4-0 nylon sutures. Associated Signs and  Symptoms: Patient reports having increase swelling. HPI Description: 82 year old patient who had an injury to her right lower extremity on July 8 while she was in [place] boarding a plane. She was treated at [facility] with sutures being placed on 09/15/2015. She was recently seen last week by her PCP edema of the feet and legs and after review as advised to take Lasix 20 mg daily. past hemoglobin A1c done in June 2017 was 9.1% Her past medical history is significant for diabetes mellitus, arthritis, basal cell carcinoma, collagenous colitis, essential hypertension, avitaminosis D. She is also status post abdominal hysterectomy, lumbar discectomy, neck surgery. She is a former smoker and quit in 1979. Of note in the recent past she was seen by Dr. Lucky [name] for what seems a sounds like injections of varicose veins. This was only around her ankles and not in the lower extremities. No Doppler studies were done. She was not advised to wear any compression stockings. 11/02/2015 -- was admitted to the hospital between 8/18 and 10/28/2015 for a syncopal attack and was found to have acute renal failure, UTI and her x-rays checked out okay. She was evaluated and discharged home on Keflex which she is completing today. 11/23/2015 -- recently discharged out of hospital after spending 2 days for a workup for seizures and unspecified syncope. MRI of the brain showed no acute abnormality and EEG is still pending and carotid Dopplers did not show any abnormality. Neurology recommended no antiepileptics and there was no arrhythmia noted on telemetry, echo was normal and a Halter monitor was recommended as an outpatient. 11/30/2015 -- her co- payment for her skin substitute was over $350 per visit and she has declined this. Electronic Signature(s) Signed: 11/30/2015 11:28:12 AM By: [name] Fudge MD, FACS Entered By: [name] on 11/30/2015 11:28:12 [name], [name] ([id number]) [name], [name]  ([id number]) -------------------------------------------------------------------------------- Physical Exam Details Patient Name: [name], [name] 11/30/2015 10:45 Date of Service: AM Medical Record [id number] Number: Patient  Account Number: [id number] March 11, 1931 (82 y.o. Treating RN: [name] Date of Birth/Sex: Female) Other Clinician: Primary Care Physician: [name] Treating [name] Referring Physician: Vernie [name] Physician/Extender: [name]: 9 Constitutional . Pulse regular. Respirations normal and unlabored. Afebrile. . Eyes Nonicteric. Reactive to light. Ears, Nose, Mouth, and Throat Lips, teeth, and gums WNL.Marland Kitchen Moist mucosa without lesions. Neck supple and nontender. No palpable supraclavicular or cervical adenopathy. Normal sized without goiter. Respiratory WNL. No retractions.. Cardiovascular Pedal Pulses WNL. No clubbing, cyanosis or edema. Lymphatic No adneopathy. No adenopathy. No adenopathy. Musculoskeletal Adexa without tenderness or enlargement.. Digits and nails w/o clubbing, cyanosis, infection, petechiae, ischemia, or inflammatory conditions.. Integumentary (Hair, Skin) No suspicious lesions. No crepitus or fluctuance. No peri-wound warmth or erythema. No masses.Marland Kitchen Psychiatric Judgement and insight Intact.. No evidence of depression, anxiety, or agitation.. Notes the wound is looking excellent and there is good resolution in size and depth and no debridement is required today. Electronic Signature(s) Signed: 11/30/2015 11:29:02 AM By: [name] Fudge MD, FACS Entered By: [name] on 11/30/2015 11:29:01 [name] ([id number]) -------------------------------------------------------------------------------- Physician Orders Details Patient Name: [name], [name] 11/30/2015 10:45 Date of Service: AM Medical Record [id number] Number: Patient Account Number: [id number] 1931-12-11 (82 y.o. Treating RN:  [name] Date of Birth/Sex: Female) Other Clinician: Primary Care Physician: [name] Treating [name] Referring Physician: Vernie [name] Physician/Extender: Suella Grove in [name]: 9 Verbal / Phone Orders: Yes ClinicianCarolyne Fiscal, Debi Read Back and Verified: Yes Diagnosis Coding Wound Cleansing Wound #3 Right,Anterior Lower Leg o Clean wound with Normal Saline. o Cleanse wound with mild soap and water o May Shower, gently pat wound dry prior to applying new dressing. Anesthetic Wound #3 Right,Anterior Lower Leg o Topical Lidocaine 4% cream applied to wound bed prior to debridement - for clinic use Primary Wound Dressing Wound #3 Right,Anterior Lower Leg o Hydrafera Blue Secondary Dressing Wound #3 Right,Anterior Lower Leg o ABD pad o Conform/Kerlix - conform netting and tape o Non-adherent pad Dressing Change Frequency Wound #3 Right,Anterior Lower Leg o Change dressing every other day. Follow-up Appointments Wound #3 Right,Anterior Lower Leg o Return Appointment in 1 week. Additional Orders / Instructions Wound #3 Right,Anterior Lower Leg o Increase protein intake. [name], [name] ([id number]) Medications-please add to medication list. Wound #3 Right,Anterior Lower Leg o Other: - Vitamin A, Vitamin C, Zinc, Multivitamin Electronic Signature(s) Signed: 11/30/2015 3:52:17 PM By: [name] Fudge MD, FACS Signed: 11/30/2015 5:24:41 PM By: Alric Quan Entered By: Alric Quan on 11/30/2015 11:18:22 [name], [name] ([id number]) -------------------------------------------------------------------------------- Problem List Details Patient Name: [name], [name] 11/30/2015 10:45 Date of Service: AM Medical Record [id number] Number: Patient Account Number: [id number] 1931/12/16 (82 y.o. Treating RN: [name] Date of Birth/Sex: Female) Other Clinician: Primary Care Physician: [name]  Treating [name] Referring Physician: Vernie [name] Physician/Extender: [name]: 9 Active Problems ICD-10 Encounter Code Description Active Date Diagnosis E11.622 Type 2 diabetes mellitus with other skin ulcer 11/09/2015 Yes S81.811A Laceration without foreign body, right lower leg, initial 11/09/2015 Yes encounter L97.212 Non-pressure chronic ulcer of right calf with fat layer 11/09/2015 Yes exposed I87.311 Chronic venous hypertension (idiopathic) with ulcer of 11/09/2015 Yes right lower extremity I89.0 Lymphedema, not elsewhere classified 11/09/2015 Yes Inactive Problems Resolved Problems Electronic Signature(s) Signed: 11/30/2015 11:26:08 AM By: [name] Fudge MD, FACS Entered By: [name] on 11/30/2015 11:26:08 [name] ([id number]) -------------------------------------------------------------------------------- Progress Note Details Patient Name: [name] 11/30/2015 10:45 Date of Service: AM Medical Record [id number] Number: Patient Account Number:  [id number] 02-Dec-1931 (82 y.o. Treating RN: [name] Date of Birth/Sex: Female) Other Clinician: Primary Care Physician: [name] Treating Shawnta Schlegel Referring Physician: Vernie [name] Physician/Extender: [name]: 9 Subjective Chief Complaint Information obtained from Patient Patient presents to the wound care center for a consult due non healing wound to the right lower extremity which she's had for about 3 [name] now History of Present Illness (HPI) The following HPI elements were documented for the patient's wound: Location: lacerated wound to the right lower extremity Quality: Patient reports experiencing a dull pain to affected area(s). Severity: Patient states wound (s) are getting better. Duration: Patient has had the wound for < 3 [name] prior to presenting for [name] Timing: Pain in wound is Intermittent (comes and goes Context: The wound  occurred when the patient had a blunt injury which caused a laceration to the right lower extremity Modifying Factors: Other [name](s) tried include:was seen in the ER on 09/15/2015 and sutured with 4-0 nylon sutures. Associated Signs and Symptoms: Patient reports having increase swelling. 82 year old patient who had an injury to her right lower extremity on July 8 while she was in [place] boarding a plane. She was treated at [facility] with sutures being placed on 09/15/2015. She was recently seen last week by her PCP edema of the feet and legs and after review as advised to take Lasix 20 mg daily. past hemoglobin A1c done in June 2017 was 9.1% Her past medical history is significant for diabetes mellitus, arthritis, basal cell carcinoma, collagenous colitis, essential hypertension, avitaminosis D. She is also status post abdominal hysterectomy, lumbar discectomy, neck surgery. She is a former smoker and quit in 1979. Of note in the recent past she was seen by Dr. Lucky [name] for what seems a sounds like injections of varicose veins. This was only around her ankles and not in the lower extremities. No Doppler studies were done. She was not advised to wear any compression stockings. 11/02/2015 -- was admitted to the hospital between 8/18 and 10/28/2015 for a syncopal attack and was found to have acute renal failure, UTI and her x-rays checked out okay. She was evaluated and discharged home on Keflex which she is completing today. 11/23/2015 -- recently discharged out of hospital after spending 2 days for a workup for seizures and unspecified syncope. MRI of the brain showed no acute abnormality and EEG is still pending and carotid [name], [name] ([id number]) Dopplers did not show any abnormality. Neurology recommended no antiepileptics and there was no arrhythmia noted on telemetry, echo was normal and a Halter monitor was recommended as an outpatient. 11/30/2015 -- her co- payment for her skin  substitute was over $350 per visit and she has declined this. Objective Constitutional Pulse regular. Respirations normal and unlabored. Afebrile. Vitals Time Taken: 10:59 AM, Height: 61 in, Weight: 170 lbs, BMI: 32.1, Temperature: 97.6 F, Pulse: 58 bpm, Respiratory Rate: 18 breaths/min, Blood Pressure: 116/53 mmHg. General Notes: Made Dr. Con Memos aware of BP and HR. Eyes Nonicteric. Reactive to light. Ears, Nose, Mouth, and Throat Lips, teeth, and gums WNL.Marland Kitchen Moist mucosa without lesions. Neck supple and nontender. No palpable supraclavicular or cervical adenopathy. Normal sized without goiter. Respiratory WNL. No retractions.. Cardiovascular Pedal Pulses WNL. No clubbing, cyanosis or edema. Lymphatic No adneopathy. No adenopathy. No adenopathy. Musculoskeletal Adexa without tenderness or enlargement.. Digits and nails w/o clubbing, cyanosis, infection, petechiae, ischemia, or inflammatory conditions.Marland Kitchen Psychiatric Judgement and insight Intact.. No evidence of depression, anxiety, or agitation.. General Notes: the wound is looking excellent  and there is good resolution in size and depth and no debridement is required today. Integumentary (Hair, Skin) [name], [name] ([id number]) No suspicious lesions. No crepitus or fluctuance. No peri-wound warmth or erythema. No masses.. Wound #3 status is Open. Original cause of wound was Trauma. The wound is located on the Right,Anterior Lower Leg. The wound measures 2cm length x 1cm width x 0.2cm depth; 1.571cm^2 area and 0.314cm^3 volume. The wound is limited to skin breakdown. There is no tunneling or undermining noted. There is a large amount of serous drainage noted. The wound margin is distinct with the outline attached to the wound base. There is large (67-100%) red, pink granulation within the wound bed. There is a small (1-33%) amount of necrotic tissue within the wound bed including Adherent Slough. The periwound skin appearance had  no abnormalities noted for color. The periwound skin appearance exhibited: Localized Edema, Maceration. The periwound skin appearance did not exhibit: Callus, Crepitus, Excoriation, Fluctuance, Friable, Induration, Rash, Scarring, Dry/Scaly, Moist. Periwound temperature was noted as No Abnormality. The periwound has tenderness on palpation. Assessment Active Problems ICD-10 E11.622 - Type 2 diabetes mellitus with other skin ulcer S81.811A - Laceration without foreign body, right lower leg, initial encounter L97.212 - Non-pressure chronic ulcer of right calf with fat layer exposed I87.311 - Chronic venous hypertension (idiopathic) with ulcer of right lower extremity I89.0 - Lymphedema, not elsewhere classified Plan Wound Cleansing: Wound #3 Right,Anterior Lower Leg: Clean wound with Normal Saline. Cleanse wound with mild soap and water May Shower, gently pat wound dry prior to applying new dressing. Anesthetic: Wound #3 Right,Anterior Lower Leg: Topical Lidocaine 4% cream applied to wound bed prior to debridement - for clinic use Primary Wound Dressing: Wound #3 Right,Anterior Lower Leg: Hydrafera Blue Secondary Dressing: Wound #3 Right,Anterior Lower Leg: ABD pad Conform/Kerlix - conform netting and tape Non-adherent pad [name], [name] R. ([id number]) Dressing Change Frequency: Wound #3 Right,Anterior Lower Leg: Change dressing every other day. Follow-up Appointments: Wound #3 Right,Anterior Lower Leg: Return Appointment in 1 week. Additional Orders / Instructions: Wound #3 Right,Anterior Lower Leg: Increase protein intake. Medications-please add to medication list.: Wound #3 Right,Anterior Lower Leg: Other: - Vitamin A, Vitamin C, Zinc, Multivitamin I have recommended we continue with elevation, exercise and compression stocking in addition to the local application of RTD on alternate days. She will return to see me next week Electronic Signature(s) Signed: 11/30/2015  11:30:16 AM By: [name] Fudge MD, FACS Entered By: [name] on 11/30/2015 11:30:15 CAYLA, SPRANDEL ([id number]) -------------------------------------------------------------------------------- SuperBill Details Patient Name: [name] Date of Service: 11/30/2015 Medical Record Number: [id number] Patient Account Number: [id number] Date of Birth/Sex: [birth year] (82 y.o. Female) Treating RN: Carolyne Fiscal, Debi Primary Care Physician: [name] Other Clinician: Referring Physician: Vernie [name] Treating Physician/Extender: Frann Rider in [name]: 9 Diagnosis Coding ICD-10 Codes Code Description E11.622 Type 2 diabetes mellitus with other skin ulcer S81.811A Laceration without foreign body, right lower leg, initial encounter L97.212 Non-pressure chronic ulcer of right calf with fat layer exposed I87.311 Chronic venous hypertension (idiopathic) with ulcer of right lower extremity I89.0 Lymphedema, not elsewhere classified Facility Procedures CPT4 Code: [id number] Description: 99213 - WOUND CARE VISIT-LEV 3 EST PT Modifier: Quantity: 1 Physician Procedures CPT4 Code Description: [id number] - WC PHYS LEVEL 3 - EST PT ICD-10 Description Diagnosis E11.622 Type 2 diabetes mellitus with other skin ulcer S81.811A Laceration without foreign body, right lower leg, initi L97.212 Non-pressure chronic ulcer of  right calf with fat layer I87.311 Chronic venous hypertension (  idiopathic) with ulcer of Modifier: al encounter exposed right lower Quantity: 1 extremity Electronic Signature(s) Signed: 11/30/2015 3:52:17 PM By: [name] Fudge MD, FACS Signed: 11/30/2015 5:24:41 PM By: Alric Quan Previous Signature: 11/30/2015 11:30:34 AM Version By: [name] Fudge MD, FACS Entered By: Alric Quan on 11/30/2015 11:43:41

## 2015-12-07 ENCOUNTER — Ambulatory Visit (INDEPENDENT_AMBULATORY_CARE_PROVIDER_SITE_OTHER): Payer: Medicare Other | Admitting: Podiatry

## 2015-12-07 ENCOUNTER — Encounter: Payer: Medicare Other | Admitting: Surgery

## 2015-12-07 DIAGNOSIS — M2040 Other hammer toe(s) (acquired), unspecified foot: Secondary | ICD-10-CM

## 2015-12-07 DIAGNOSIS — M79676 Pain in unspecified toe(s): Secondary | ICD-10-CM

## 2015-12-07 DIAGNOSIS — E11622 Type 2 diabetes mellitus with other skin ulcer: Secondary | ICD-10-CM | POA: Diagnosis not present

## 2015-12-07 DIAGNOSIS — L84 Corns and callosities: Secondary | ICD-10-CM | POA: Diagnosis not present

## 2015-12-07 DIAGNOSIS — B351 Tinea unguium: Secondary | ICD-10-CM

## 2015-12-07 NOTE — Progress Notes (Signed)
Subjective: Patient presents to office for evaluation foot pain secondary to callus skin. Patient complains of pain at the lesion present on the lateral aspect of the fifth digit. Patient has tried padding with no relief in symptoms. Patient denies any other pedal complaints.   Allergies  Allergen Reactions  . Morphine Sulfate Other (See Comments)    BP bottoms out  . Penicillins Diarrhea    Has patient had a PCN reaction causing immediate rash, facial/tongue/throat swelling, SOB or lightheadedness with hypotension: Yes Has patient had a PCN reaction causing severe rash involving mucus membranes or skin necrosis: Yes Has patient had a PCN reaction that required hospitalization No Has patient had a PCN reaction occurring within the last 10 years: No If all of the above answers are "NO", then may proceed with Cephalosporin use.    Objective:  Physical Exam General: Alert and oriented x3 in no acute distress  Dermatology: Hyperkeratotic lesion present on the Lateral aspect of the fifth digit. Pain on palpation with a central nucleated core noted.  Skin is warm, dry and supple bilateral lower extremities. Negative for open lesions or macerations.  Vascular: Palpable pedal pulses bilaterally. No edema or erythema noted. Capillary refill within normal limits.  Neurological: Epicritic and protective threshold grossly intact bilaterally.   Musculoskeletal Exam: Pain on palpation at the keratotic lesion noted. Range of motion within normal limits bilateral. Muscle strength 5/5 in all groups bilateral.  Assessment and Plan: Problem List Items Addressed This Visit    None    Visit Diagnoses   None.   #1 painful callus fifth toe left foot.  #2 pain in left foot  1. Patient evaluated 2. Excisional debridement of  keratoic lesion using a chisel/15 blade; treated the area withSalinocaine covered with moleskin -Encouraged daily skin emollients -Encouraged use of pumice stone -Advised good  supportive shoes and inserts -Patient to return to office as needed or sooner if condition worsens.  Edrick Kins, DPM

## 2015-12-08 NOTE — Progress Notes (Signed)
LANE, KJOS ([id number]) Visit Report for 12/07/2015 Arrival Information Details Patient Name: [name], [name] Date of Service: 12/07/2015 10:45 AM Medical Record Number: [id number] Patient Account Number: [id number] Date of Birth/Sex: 04/08/1931 (82 y.o. Female) Treating RN: Carolyne Fiscal, Debi Primary Care Physician: [name] Other Clinician: Referring Physician: [name] Treating Physician/Extender: Frann Rider in Treatment: 10 Visit Information History Since Last Visit All ordered tests and consults were completed: No Patient Arrived: Ambulatory Added or deleted any medications: No Arrival Time: 10:47 Any new allergies or adverse reactions: No Accompanied By: daughter Had a fall or experienced change in No Transfer Assistance: None activities of daily living that may affect Patient Identification Verified: Yes risk of falls: Secondary Verification Process Yes Signs or symptoms of abuse/neglect since last No Completed: visito Patient Requires Transmission- No Hospitalized since last visit: No Based Precautions: Pain Present Now: No Patient Has Alerts: Yes Patient Alerts: Patient on Blood Thinner aspirin BMI 1.04 to rt lower leg Electronic Signature(s) Signed: 12/07/2015 4:03:29 PM By: Alric Quan Entered By: Alric Quan on 12/07/2015 10:47:51 [name], [name] ([id number]) -------------------------------------------------------------------------------- Clinic Level of Care Assessment Details Patient Name: [name] Date of Service: 12/07/2015 10:45 AM Medical Record Number: [id number] Patient Account Number: [id number] Date of Birth/Sex: 05-12-1931 (82 y.o. Female) Treating RN: Montey Hora Primary Care Physician: [name] Other Clinician: Referring Physician: [name] Treating Physician/Extender: Frann Rider in Treatment: 10 Clinic Level of Care Assessment Items TOOL 4 Quantity Score '[]'$  -  Use when only an EandM is performed on FOLLOW-UP visit 0 ASSESSMENTS - Nursing Assessment / Reassessment X - Reassessment of Co-morbidities (includes updates in patient status) 1 10 X - Reassessment of Adherence to Treatment Plan 1 5 ASSESSMENTS - Wound and Skin Assessment / Reassessment X - Simple Wound Assessment / Reassessment - one wound 1 5 '[]'$  - Complex Wound Assessment / Reassessment - multiple wounds 0 '[]'$  - Dermatologic / Skin Assessment (not related to wound area) 0 ASSESSMENTS - Focused Assessment '[]'$  - Circumferential Edema Measurements - multi extremities 0 '[]'$  - Nutritional Assessment / Counseling / Intervention 0 X - Lower Extremity Assessment (monofilament, tuning fork, pulses) 1 5 '[]'$  - Peripheral Arterial Disease Assessment (using hand held doppler) 0 ASSESSMENTS - Ostomy and/or Continence Assessment and Care '[]'$  - Incontinence Assessment and Management 0 '[]'$  - Ostomy Care Assessment and Management (repouching, etc.) 0 PROCESS - Coordination of Care X - Simple Patient / Family Education for ongoing care 1 15 '[]'$  - Complex (extensive) Patient / Family Education for ongoing care 0 '[]'$  - Staff obtains Programmer, systems, Records, Test Results / Process Orders 0 '[]'$  - Staff telephones HHA, Nursing Homes / Clarify orders / etc 0 '[]'$  - Routine Transfer to another Facility (non-emergent condition) 0 [name], [name] ([id number]) '[]'$  - Routine Hospital Admission (non-emergent condition) 0 '[]'$  - New Admissions / Biomedical engineer / Ordering NPWT, Apligraf, etc. 0 '[]'$  - Emergency Hospital Admission (emergent condition) 0 X - Simple Discharge Coordination 1 10 '[]'$  - Complex (extensive) Discharge Coordination 0 PROCESS - Special Needs '[]'$  - Pediatric / Minor Patient Management 0 '[]'$  - Isolation Patient Management 0 '[]'$  - Hearing / Language / Visual special needs 0 '[]'$  - Assessment of Community assistance (transportation, D/C planning, etc.) 0 '[]'$  - Additional assistance / Altered mentation 0 '[]'$  -  Support Surface(s) Assessment (bed, cushion, seat, etc.) 0 INTERVENTIONS - Wound Cleansing / Measurement X - Simple Wound Cleansing - one wound 1 5 '[]'$  - Complex Wound Cleansing - multiple wounds  0 X - Wound Imaging (photographs - any number of wounds) 1 5 '[]'$  - Wound Tracing (instead of photographs) 0 X - Simple Wound Measurement - one wound 1 5 '[]'$  - Complex Wound Measurement - multiple wounds 0 INTERVENTIONS - Wound Dressings X - Small Wound Dressing one or multiple wounds 1 10 '[]'$  - Medium Wound Dressing one or multiple wounds 0 '[]'$  - Large Wound Dressing one or multiple wounds 0 '[]'$  - Application of Medications - topical 0 '[]'$  - Application of Medications - injection 0 INTERVENTIONS - Miscellaneous '[]'$  - External ear exam 0 [name], [name] ([id number]) '[]'$  - Specimen Collection (cultures, biopsies, blood, body fluids, etc.) 0 '[]'$  - Specimen(s) / Culture(s) sent or taken to Lab for analysis 0 '[]'$  - Patient Transfer (multiple staff / Harrel Lemon Lift / Similar devices) 0 '[]'$  - Simple Staple / Suture removal (25 or less) 0 '[]'$  - Complex Staple / Suture removal (26 or more) 0 '[]'$  - Hypo / Hyperglycemic Management (close monitor of Blood Glucose) 0 '[]'$  - Ankle / Brachial Index (ABI) - do not check if billed separately 0 X - Vital Signs 1 5 Has the patient been seen at the hospital within the last three years: Yes Total Score: 80 Level Of Care: New/Established - Level 3 Electronic Signature(s) Signed: 12/07/2015 4:28:24 PM By: Montey Hora Entered By: Montey Hora on 12/07/2015 11:01:57 [name] ([id number]) -------------------------------------------------------------------------------- Encounter Discharge Information Details Patient Name: [name] Date of Service: 12/07/2015 10:45 AM Medical Record Number: [id number] Patient Account Number: [id number] Date of Birth/Sex: Jul 20, 1931 (82 y.o. Female) Treating RN: Carolyne Fiscal, Debi Primary Care Physician: [name] Other  Clinician: Referring Physician: [name] Treating Physician/Extender: Frann Rider in Treatment: 10 Encounter Discharge Information Items Discharge Pain Level: 0 Discharge Condition: Stable Ambulatory Status: Cane Discharge Destination: Home Transportation: Private Auto Accompanied By: daughter Schedule Follow-up Appointment: Yes Medication Reconciliation completed Yes and provided to Patient/Care Sulma Ruffino: Provided on Clinical Summary of Care: 12/07/2015 Form Type Recipient Paper Patient [facility] Electronic Signature(s) Signed: 12/07/2015 11:18:18 AM By: Ruthine Dose Entered By: Ruthine Dose on 12/07/2015 11:18:18 [name] ([id number]) -------------------------------------------------------------------------------- Lower Extremity Assessment Details Patient Name: [name] Date of Service: 12/07/2015 10:45 AM Medical Record Number: [id number] Patient Account Number: [id number] Date of Birth/Sex: 1932-02-14 (82 y.o. Female) Treating RN: Carolyne Fiscal, Debi Primary Care Physician: [name] Other Clinician: Referring Physician: [name] Treating Physician/Extender: Frann Rider in Treatment: 10 Vascular Assessment Pulses: Posterior Tibial Dorsalis Pedis Palpable: [Right:Yes] Extremity colors, hair growth, and conditions: Extremity Color: [Right:Mottled] Temperature of Extremity: [Right:Warm] Capillary Refill: [Right:< 3 seconds] Toe Nail Assessment Left: Right: Thick: No Discolored: No Deformed: No Improper Length and Hygiene: No Electronic Signature(s) Signed: 12/07/2015 4:03:29 PM By: Alric Quan Entered By: Alric Quan on 12/07/2015 10:53:17 [place], [name] ([id number]) -------------------------------------------------------------------------------- Multi Wound Chart Details Patient Name: [name] Date of Service: 12/07/2015 10:45 AM Medical Record Number: [id number] Patient Account Number:  [id number] Date of Birth/Sex: Jul 24, 1931 (82 y.o. 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] Secondary Etiology: [3:Venous Leg Ulcer] [N/A:N/A] Comorbid History: [3:Cataracts, Hypertension, N/A Colitis, Type II Diabetes, Osteoarthritis] Date  Acquired: [3:09/15/2015] [N/A:N/A] Weeks of Treatment: [3:10] [N/A:N/A] Wound Status: [3:Open] [N/A:N/A] Measurements L x W x D 0.9x1.2x0.2 [N/A:N/A] (cm) Area (cm) : [3:0.848] [N/A:N/A] Volume (cm) : [3:0.17] [N/A:N/A] % Reduction in Area: [3:98.50%] [N/A:N/A] % Reduction in Volume: 97.00% [N/A:N/A] Classification: [3:Grade 1] [N/A:N/A] Exudate Amount: [3:Large] [N/A:N/A] Exudate Type: [3:Serosanguineous] [N/A:N/A] Exudate Color: [3:red, brown] [N/A:N/A] Foul Odor After [3:Yes] [N/A:N/A] Cleansing: Odor Anticipated Due to No [N/A:N/A] Product Use: Wound Margin: [3:Distinct, outline attached N/A] Granulation Amount: [3:Medium (34-66%)] [N/A:N/A] Granulation Quality: [3:Red, Pink] [N/A:N/A] Necrotic Amount: [3:Medium (34-66%)] [N/A:N/A] Exposed Structures: Fascia: No N/A N/A Fat: No Tendon: No Muscle: No Joint: No Bone: No Limited to Skin Breakdown Epithelialization: None N/A N/A Periwound Skin Texture: Edema: Yes N/A N/A Excoriation: No Induration: No Callus: No Crepitus: No Fluctuance: No Friable: No Rash: No Scarring: No Periwound Skin Maceration: Yes N/A N/A Moisture: Moist: No Dry/Scaly: No Periwound  Skin Color: No Abnormalities Noted N/A N/A Temperature: No Abnormality N/A N/A Tenderness on Yes N/A N/A Palpation: Wound Preparation: Ulcer Cleansing: N/A N/A Rinsed/Irrigated with Saline Topical Anesthetic Applied: Other: lidocaine 4% Treatment Notes Electronic Signature(s) Signed: 12/07/2015 4:28:24 PM By: Curtis Sites Entered By: Curtis Sites on 12/07/2015 11:00:49 [name], [name] ([id number]) -------------------------------------------------------------------------------- Multi-Disciplinary Care Plan Details Patient Name: [name] Date of Service: 12/07/2015 10:45 AM Medical Record Number: [id number] Patient Account Number: [id number] Date of Birth/Sex: 03/29/1931 (82 y.o. Female) Treating RN: Curtis Sites Primary Care Physician: Dortha Kern Other Clinician: Referring Physician: Dortha Kern Treating Physician/Extender: Rudene Re in Treatment: 10 Active Inactive Abuse / Safety / Falls / Self Care Management Nursing Diagnoses: Potential for falls Goals: Patient/caregiver will verbalize understanding of skin care regimen Date Initiated: 09/28/2015 Goal Status: Active Patient/caregiver will verbalize/demonstrate measures taken to prevent injury and/or falls Date Initiated: 09/28/2015 Goal Status: Active Interventions: Assess fall risk on admission and as needed Assess: immobility, friction, shearing, incontinence upon admission and as needed Assess impairment of mobility on admission and as needed per policy Assess self care needs on admission and as needed Provide education on fall prevention Notes: Orientation to the Wound Care Program Nursing Diagnoses: Knowledge deficit related to the wound healing center program Goals: Patient/caregiver will verbalize understanding of the Wound Healing Center Program Date Initiated: 09/28/2015 Goal Status: Active Interventions: Provide education on orientation to the wound  center Notes: [name], [name] ([id number]) Pain, Acute or Chronic Nursing Diagnoses: Potential alteration in comfort, pain Goals: Patient will verbalize adequate pain control and receive pain control interventions during procedures as needed Date Initiated: 09/28/2015 Goal Status: Active Patient/caregiver will verbalize adequate pain control between visits Date Initiated: 09/28/2015 Goal Status: Active Patient/caregiver will verbalize comfort level met Date Initiated: 09/28/2015 Goal Status: Active Interventions: Assess comfort goal upon admission Complete pain assessment as per visit requirements Encourage patient to take pain medications as prescribed Notes: Wound/Skin Impairment Nursing Diagnoses: Impaired tissue integrity Goals: Ulcer/skin breakdown will heal within 14 weeks Date Initiated: 09/28/2015 Goal Status: Active Interventions: Assess patient/caregiver ability to obtain necessary supplies Assess patient/caregiver ability to perform ulcer/skin care regimen upon admission and as needed Provide education on ulcer and skin care Notes: Electronic Signature(s) Signed: 12/07/2015 4:28:24 PM By: Curtis Sites Entered By: Curtis Sites on 12/07/2015 11:00:29 [name] ([id number]) -------------------------------------------------------------------------------- Pain Assessment Details Patient Name: [name] Date of Service: 12/07/2015 10:45 AM Medical Record Number: [id number] Patient Account Number: [id number] Date of Birth/Sex: [birth year] (82 y.o. Female) Treating RN: Ashok Cordia, Debi Primary Care Physician: Dortha Kern Other Clinician: Referring Physician: Shella Spearing  DENNIS Treating Physician/Extender: Frann Rider in Treatment: 10 Active Problems Location of Pain Severity and Description of Pain Patient Has Paino No Site Locations With Dressing Change: No Pain Management and Medication Current Pain Management: Electronic  Signature(s) Signed: 12/07/2015 4:03:29 PM By: Alric Quan Entered By: Alric Quan on 12/07/2015 10:47:57 [name] ([id number]) -------------------------------------------------------------------------------- Patient/Caregiver Education Details Patient Name: [name] Date of Service: 12/07/2015 10:45 AM Medical Record Number: [id number] Patient Account Number: [id number] Date of Birth/Gender: 1931/12/30 (82 y.o. Female) Treating RN: Carolyne Fiscal, Debi Primary Care Physician: [name] Other Clinician: Referring Physician: [name] Treating Physician/Extender: Frann Rider in Treatment: 10 Education Assessment Education Provided To: Patient Education Topics Provided Wound/Skin Impairment: Handouts: Other: change dressing as ordered Methods: Demonstration, Explain/Verbal Responses: State content correctly Electronic Signature(s) Signed: 12/07/2015 4:03:29 PM By: Alric Quan Entered By: Alric Quan on 12/07/2015 11:04:39 [name], [name] ([id number]) -------------------------------------------------------------------------------- Wound Assessment Details Patient Name: [name] Date of Service: 12/07/2015 10:45 AM Medical Record Number: [id number] Patient Account Number: [id number] Date of Birth/Sex: 05-Jun-1931 (82 y.o. Female) Treating RN: Carolyne Fiscal, Debi Primary Care Physician: [name] Other Clinician: Referring Physician: [name] Treating Physician/Extender: Frann Rider in Treatment: 10 Wound Status Wound Number: 3 Primary Diabetic Wound/Ulcer of the Lower Etiology: Extremity Wound Location: Right Lower Leg - Anterior Secondary Venous Leg Ulcer Wounding Event: Trauma Etiology: Date Acquired: 09/15/2015 Wound Status: Open Weeks Of Treatment: 10 Comorbid Cataracts, Hypertension, Colitis, Clustered Wound: No History: Type II Diabetes, Osteoarthritis Photos Photo Uploaded By:  Alric Quan on 12/07/2015 11:26:50 Wound Measurements Length: (cm) 0.9 Width: (cm) 1.2 Depth: (cm) 0.2 Area: (cm) 0.848 Volume: (cm) 0.17 % Reduction in Area: 98.5% % Reduction in Volume: 97% Epithelialization: None Tunneling: No Undermining: No Wound Description Classification: Grade 1 Foul Odor Af Wound Margin: Distinct, outline attached Due to Produ Exudate Amount: Large Exudate Type: Serosanguineous Exudate Color: red, brown ter Cleansing: Yes ct Use: No Wound Bed Granulation Amount: Medium (34-66%) Exposed Structure Granulation Quality: Red, Pink Fascia Exposed: No Necrotic Amount: Medium (34-66%) Fat Layer Exposed: No Necrotic Quality: Adherent Slough Tendon Exposed: No [name], [name] ([id number]) Muscle Exposed: No Joint Exposed: No Bone Exposed: No Limited to Skin Breakdown Periwound Skin Texture Texture Color No Abnormalities Noted: No No Abnormalities Noted: Yes Callus: No Temperature / Pain Crepitus: No Temperature: No Abnormality Excoriation: No Tenderness on Palpation: Yes Fluctuance: No Friable: No Induration: No Localized Edema: Yes Rash: No Scarring: No Moisture No Abnormalities Noted: No Dry / Scaly: No Maceration: Yes Moist: No Wound Preparation Ulcer Cleansing: Rinsed/Irrigated with Saline Topical Anesthetic Applied: Other: lidocaine 4%, Treatment Notes Wound #3 (Right, Anterior Lower Leg) 1. Cleansed with: Clean wound with Normal Saline 2. Anesthetic Topical Lidocaine 4% cream to wound bed prior to debridement 4. Dressing Applied: Prisma Ag 5. Secondary Dressing Applied ABD Pad Dry Gauze Kerlix/Conform 7. Secured with Tape Notes netting Electronic Signature(s) Signed: 12/07/2015 4:03:29 PM By: [name], [name] ([id number]) Entered By: Alric Quan on 12/07/2015 10:55:12 [name], [name] ([id number]) -------------------------------------------------------------------------------- Vitals  Details Patient Name: [name] Date of Service: 12/07/2015 10:45 AM Medical Record Number: [id number] Patient Account Number: [id number] Date of Birth/Sex: 08-18-1931 (82 y.o. Female) Treating RN: Carolyne Fiscal, Debi Primary Care Physician: [name] Other Clinician: Referring Physician: [name] Treating Physician/Extender: Frann Rider in Treatment: 10 Vital Signs Time Taken: 10:48 Temperature (F): 97.8 Height (in): 61 Pulse (bpm): 57 Weight (lbs): 170 Respiratory Rate (breaths/min): 18 Body Mass Index (BMI): 32.1 Blood Pressure (  mmHg): 148/55 Reference Range: 80 - 120 mg / dl Notes My Dr. Con Memos aware of HR. Electronic Signature(s) Signed: 12/07/2015 4:03:29 PM By: Alric Quan Entered By: Alric Quan on 12/07/2015 10:49:56

## 2015-12-08 NOTE — Progress Notes (Signed)
[name], DRAFT ([id number]) Visit Report for 12/07/2015 Chief Complaint Document Details Patient Name: [name], [name] 12/07/2015 10:45 Date of Service: AM Medical Record [id number] Number: Patient Account Number: [id number] [birth year] (82 y.o. Treating RN: Ahmed Prima Date of Birth/Sex: Female) Other Clinician: Primary Care Physician: [name] Treating Tomoya Ringwald Referring Physician: [name] Physician/Extender: Weeks in Treatment: 10 Information Obtained from: Patient Chief Complaint Patient presents to the wound care center for a consult due non healing wound to the right lower extremity which she's had for about 3 weeks now Electronic Signature(s) Signed: 12/07/2015 11:20:40 AM By: Christin Fudge MD, FACS Entered By: Christin Fudge on 12/07/2015 11:20:40 [name] ([id number]) -------------------------------------------------------------------------------- HPI Details Patient Name: [name] 12/07/2015 10:45 Date of Service: AM Medical Record [id number] Number: Patient Account Number: [id number] 09/27/1931 (82 y.o. Treating RN: Ahmed Prima Date of Birth/Sex: Female) Other Clinician: Primary Care Physician: [name] Treating Romonia Yanik Referring Physician: [name] Physician/Extender: Weeks in Treatment: 10 History of Present Illness Location: lacerated wound to the right lower extremity Quality: Patient reports experiencing a dull pain to affected area(s). Severity: Patient states wound (s) are getting better. Duration: Patient has had the wound for < 3 weeks prior to presenting for treatment Timing: Pain in wound is Intermittent (comes and goes Context: The wound occurred when the patient had a blunt injury which caused a laceration to the right lower extremity Modifying Factors: Other treatment(s) tried include:was seen in the ER on 09/15/2015 and sutured with 4-0 nylon sutures. Associated Signs and  Symptoms: Patient reports having increase swelling. HPI Description: 82 year old patient who had an injury to her right lower extremity on July 8 while she was in [place] boarding a plane. She was treated at [facility] with sutures being placed on 09/15/2015. She was recently seen last week by her PCP edema of the feet and legs and after review as advised to take Lasix 20 mg daily. past hemoglobin A1c done in June 2017 was 9.1% Her past medical history is significant for diabetes mellitus, arthritis, basal cell carcinoma, collagenous colitis, essential hypertension, avitaminosis D. She is also status post abdominal hysterectomy, lumbar discectomy, neck surgery. She is a former smoker and quit in 1979. Of note in the recent past she was seen by Dr. Lucky Cowboy for what seems a sounds like injections of varicose veins. This was only around her ankles and not in the lower extremities. No Doppler studies were done. She was not advised to wear any compression stockings. 11/02/2015 -- was admitted to the hospital between 8/18 and 10/28/2015 for a syncopal attack and was found to have acute renal failure, UTI and her x-rays checked out okay. She was evaluated and discharged home on Keflex which she is completing today. 11/23/2015 -- recently discharged out of hospital after spending 2 days for a workup for seizures and unspecified syncope. MRI of the brain showed no acute abnormality and EEG is still pending and carotid Dopplers did not show any abnormality. Neurology recommended no antiepileptics and there was no arrhythmia noted on telemetry, echo was normal and a Halter monitor was recommended as an outpatient. 11/30/2015 -- her co- payment for her skin substitute was over $350 per visit and she has declined this. Electronic Signature(s) Signed: 12/07/2015 11:20:50 AM By: Christin Fudge MD, FACS Entered By: Christin Fudge on 12/07/2015 11:20:49 [name], [name] ([id number]) [name], [name]  ([id number]) -------------------------------------------------------------------------------- Physical Exam Details Patient Name: [name], [name] 12/07/2015 10:45 Date of Service: AM Medical Record [id number] Number: Patient  Account Number: [id number] June 12, 1931 (82 y.o. Treating RN: Ahmed Prima Date of Birth/Sex: Female) Other Clinician: Primary Care Physician: [name] Treating Landen Knoedler Referring Physician: [name] Physician/Extender: Weeks in Treatment: 10 Constitutional . Pulse regular. Respirations normal and unlabored. Afebrile. . Eyes Nonicteric. Reactive to light. Ears, Nose, Mouth, and Throat Lips, teeth, and gums WNL.Marland Kitchen Moist mucosa without lesions. Neck supple and nontender. No palpable supraclavicular or cervical adenopathy. Normal sized without goiter. Respiratory WNL. No retractions.. Cardiovascular Pedal Pulses WNL. No clubbing, cyanosis or edema. Lymphatic No adneopathy. No adenopathy. No adenopathy. Musculoskeletal Adexa without tenderness or enlargement.. Digits and nails w/o clubbing, cyanosis, infection, petechiae, ischemia, or inflammatory conditions.. Integumentary (Hair, Skin) No suspicious lesions. No crepitus or fluctuance. No peri-wound warmth or erythema. No masses.Marland Kitchen Psychiatric Judgement and insight Intact.. No evidence of depression, anxiety, or agitation.. Notes and has had excellent resolution and the depth and the size and no debridement is required. Electronic Signature(s) Signed: 12/07/2015 11:21:32 AM By: Christin Fudge MD, FACS Entered By: Christin Fudge on 12/07/2015 11:21:32 [name], [name] ([id number]) -------------------------------------------------------------------------------- Physician Orders Details Patient Name: [name], [name] 12/07/2015 10:45 Date of Service: AM Medical Record [id number] Number: Patient Account Number: [id number] 10/19/1931 (82 y.o. Treating RN: Montey Hora Date of  Birth/Sex: Female) Other Clinician: Primary Care Physician: [name] Treating Lina Hitch Referring Physician: [name] Physician/Extender: Suella Grove in Treatment: 10 Verbal / Phone Orders: Yes Clinician: Montey Hora Read Back and Verified: Yes Diagnosis Coding Wound Cleansing Wound #3 Right,Anterior Lower Leg o Clean wound with Normal Saline. o Cleanse wound with mild soap and water o May Shower, gently pat wound dry prior to applying new dressing. Anesthetic Wound #3 Right,Anterior Lower Leg o Topical Lidocaine 4% cream applied to wound bed prior to debridement - for clinic use Primary Wound Dressing Wound #3 Right,Anterior Lower Leg o Prisma Ag Secondary Dressing Wound #3 Right,Anterior Lower Leg o ABD pad o Conform/Kerlix - conform netting and tape o Non-adherent pad Dressing Change Frequency Wound #3 Right,Anterior Lower Leg o Change dressing every other day. Follow-up Appointments Wound #3 Right,Anterior Lower Leg o Return Appointment in 1 week. Additional Orders / Instructions Wound #3 Right,Anterior Lower Leg o Increase protein intake. [name], [name] ([id number]) Medications-please add to medication list. Wound #3 Right,Anterior Lower Leg o Other: - Vitamin A, Vitamin C, Zinc, Multivitamin Electronic Signature(s) Signed: 12/07/2015 3:47:29 PM By: Christin Fudge MD, FACS Signed: 12/07/2015 4:28:24 PM By: Montey Hora Entered By: Montey Hora on 12/07/2015 11:02:34 [name], [name] ([id number]) -------------------------------------------------------------------------------- Problem List Details Patient Name: [name], [name] 12/07/2015 10:45 Date of Service: AM Medical Record [id number] Number: Patient Account Number: [id number] 03-19-1931 (82 y.o. Treating RN: Ahmed Prima Date of Birth/Sex: Female) Other Clinician: Primary Care Physician: [name] Treating Christin Fudge Referring Physician:  [name] Physician/Extender: Weeks in Treatment: 10 Active Problems ICD-10 Encounter Code Description Active Date Diagnosis E11.622 Type 2 diabetes mellitus with other skin ulcer 11/09/2015 Yes S81.811A Laceration without foreign body, right lower leg, initial 11/09/2015 Yes encounter L97.212 Non-pressure chronic ulcer of right calf with fat layer 11/09/2015 Yes exposed I87.311 Chronic venous hypertension (idiopathic) with ulcer of 11/09/2015 Yes right lower extremity I89.0 Lymphedema, not elsewhere classified 11/09/2015 Yes Inactive Problems Resolved Problems Electronic Signature(s) Signed: 12/07/2015 11:20:33 AM By: Christin Fudge MD, FACS Entered By: Christin Fudge on 12/07/2015 11:20:33 [name] ([id number]) -------------------------------------------------------------------------------- Progress Note Details Patient Name: [name] 12/07/2015 10:45 Date of Service: AM Medical Record [id number] Number: Patient Account Number: [id number] 01-23-1932 (82 y.o.  Treating RN: Ahmed Prima Date of Birth/Sex: Female) Other Clinician: Primary Care Physician: [name] Treating Jennelle Pinkstaff Referring Physician: [name] Physician/Extender: Weeks in Treatment: 10 Subjective Chief Complaint Information obtained from Patient Patient presents to the wound care center for a consult due non healing wound to the right lower extremity which she's had for about 3 weeks now History of Present Illness (HPI) The following HPI elements were documented for the patient's wound: Location: lacerated wound to the right lower extremity Quality: Patient reports experiencing a dull pain to affected area(s). Severity: Patient states wound (s) are getting better. Duration: Patient has had the wound for < 3 weeks prior to presenting for treatment Timing: Pain in wound is Intermittent (comes and goes Context: The wound occurred when the patient had a blunt injury which  caused a laceration to the right lower extremity Modifying Factors: Other treatment(s) tried include:was seen in the ER on 09/15/2015 and sutured with 4-0 nylon sutures. Associated Signs and Symptoms: Patient reports having increase swelling. 82 year old patient who had an injury to her right lower extremity on July 8 while she was in [place] boarding a plane. She was treated at [facility] with sutures being placed on 09/15/2015. She was recently seen last week by her PCP edema of the feet and legs and after review as advised to take Lasix 20 mg daily. past hemoglobin A1c done in June 2017 was 9.1% Her past medical history is significant for diabetes mellitus, arthritis, basal cell carcinoma, collagenous colitis, essential hypertension, avitaminosis D. She is also status post abdominal hysterectomy, lumbar discectomy, neck surgery. She is a former smoker and quit in 1979. Of note in the recent past she was seen by Dr. Lucky Cowboy for what seems a sounds like injections of varicose veins. This was only around her ankles and not in the lower extremities. No Doppler studies were done. She was not advised to wear any compression stockings. 11/02/2015 -- was admitted to the hospital between 8/18 and 10/28/2015 for a syncopal attack and was found to have acute renal failure, UTI and her x-rays checked out okay. She was evaluated and discharged home on Keflex which she is completing today. 11/23/2015 -- recently discharged out of hospital after spending 2 days for a workup for seizures and unspecified syncope. MRI of the brain showed no acute abnormality and EEG is still pending and carotid [name], [name] ([id number]) Dopplers did not show any abnormality. Neurology recommended no antiepileptics and there was no arrhythmia noted on telemetry, echo was normal and a Halter monitor was recommended as an outpatient. 11/30/2015 -- her co- payment for her skin substitute was over $350 per visit and she has  declined this. Objective Constitutional Pulse regular. Respirations normal and unlabored. Afebrile. Vitals Time Taken: 10:48 AM, Height: 61 in, Weight: 170 lbs, BMI: 32.1, Temperature: 97.8 F, Pulse: 57 bpm, Respiratory Rate: 18 breaths/min, Blood Pressure: 148/55 mmHg. General Notes: My Dr. Con Memos aware of HR. Eyes Nonicteric. Reactive to light. Ears, Nose, Mouth, and Throat Lips, teeth, and gums WNL.Marland Kitchen Moist mucosa without lesions. Neck supple and nontender. No palpable supraclavicular or cervical adenopathy. Normal sized without goiter. Respiratory WNL. No retractions.. Cardiovascular Pedal Pulses WNL. No clubbing, cyanosis or edema. Lymphatic No adneopathy. No adenopathy. No adenopathy. Musculoskeletal Adexa without tenderness or enlargement.. Digits and nails w/o clubbing, cyanosis, infection, petechiae, ischemia, or inflammatory conditions.Marland Kitchen Psychiatric Judgement and insight Intact.. No evidence of depression, anxiety, or agitation.. General Notes: and has had excellent resolution and the depth and the size  and no debridement is required. Integumentary (Hair, Skin) Kinch, Nilah R. ([id number]) No suspicious lesions. No crepitus or fluctuance. No peri-wound warmth or erythema. No masses.. Wound #3 status is Open. Original cause of wound was Trauma. The wound is located on the Right,Anterior Lower Leg. The wound measures 0.9cm length x 1.2cm width x 0.2cm depth; 0.848cm^2 area and 0.17cm^3 volume. The wound is limited to skin breakdown. There is no tunneling or undermining noted. There is a large amount of serosanguineous drainage noted. The wound margin is distinct with the outline attached to the wound base. There is medium (34-66%) red, pink granulation within the wound bed. There is a medium (34-66%) amount of necrotic tissue within the wound bed including Adherent Slough. The periwound skin appearance had no abnormalities noted for color. The periwound skin  appearance exhibited: Localized Edema, Maceration. The periwound skin appearance did not exhibit: Callus, Crepitus, Excoriation, Fluctuance, Friable, Induration, Rash, Scarring, Dry/Scaly, Moist. Periwound temperature was noted as No Abnormality. The periwound has tenderness on palpation. Assessment Active Problems ICD-10 E11.622 - Type 2 diabetes mellitus with other skin ulcer S81.811A - Laceration without foreign body, right lower leg, initial encounter L97.212 - Non-pressure chronic ulcer of right calf with fat layer exposed I87.311 - Chronic venous hypertension (idiopathic) with ulcer of right lower extremity I89.0 - Lymphedema, not elsewhere classified Plan Wound Cleansing: Wound #3 Right,Anterior Lower Leg: Clean wound with Normal Saline. Cleanse wound with mild soap and water May Shower, gently pat wound dry prior to applying new dressing. Anesthetic: Wound #3 Right,Anterior Lower Leg: Topical Lidocaine 4% cream applied to wound bed prior to debridement - for clinic use Primary Wound Dressing: Wound #3 Right,Anterior Lower Leg: Prisma Ag Secondary Dressing: Wound #3 Right,Anterior Lower Leg: ABD pad Conform/Kerlix - conform netting and tape Non-adherent pad Kipnis, Tishanna R. ([id number]) Dressing Change Frequency: Wound #3 Right,Anterior Lower Leg: Change dressing every other day. Follow-up Appointments: Wound #3 Right,Anterior Lower Leg: Return Appointment in 1 week. Additional Orders / Instructions: Wound #3 Right,Anterior Lower Leg: Increase protein intake. Medications-please add to medication list.: Wound #3 Right,Anterior Lower Leg: Other: - Vitamin A, Vitamin C, Zinc, Multivitamin I have recommended we continue with elevation, exercise and compression stocking in addition to the local application of Prisma Ag on alternate days. She will return to see me next week Electronic Signature(s) Signed: 12/07/2015 11:22:21 AM By: Christin Fudge MD, FACS Entered By:  Christin Fudge on 12/07/2015 11:22:20 VIONA, LEDVINA ([id number]) -------------------------------------------------------------------------------- SuperBill Details Patient Name: [name] Date of Service: 12/07/2015 Medical Record Number: [id number] Patient Account Number: [id number] Date of Birth/Sex: [birth year] (82 y.o. Female) Treating RN: Carolyne Fiscal, Debi Primary Care Physician: [name] Other Clinician: Referring Physician: [name] Treating Physician/Extender: Frann Rider in Treatment: 10 Diagnosis Coding ICD-10 Codes Code Description E11.622 Type 2 diabetes mellitus with other skin ulcer S81.811A Laceration without foreign body, right lower leg, initial encounter L97.212 Non-pressure chronic ulcer of right calf with fat layer exposed I87.311 Chronic venous hypertension (idiopathic) with ulcer of right lower extremity I89.0 Lymphedema, not elsewhere classified Facility Procedures CPT4 Code: [id number] Description: 99213 - WOUND CARE VISIT-LEV 3 EST PT Modifier: Quantity: 1 Physician Procedures CPT4 Code Description: [id number] - WC PHYS LEVEL 3 - EST PT ICD-10 Description Diagnosis E11.622 Type 2 diabetes mellitus with other skin ulcer S81.811A Laceration without foreign body, right lower leg, initi L97.212 Non-pressure chronic ulcer of  right calf with fat layer I87.311 Chronic venous hypertension (idiopathic) with ulcer of Modifier: al encounter exposed right  lower Quantity: 1 extremity Electronic Signature(s) Signed: 12/07/2015 11:22:45 AM By: Christin Fudge MD, FACS Entered By: Christin Fudge on 12/07/2015 11:22:44

## 2015-12-14 ENCOUNTER — Encounter: Payer: Medicare Other | Attending: General Surgery | Admitting: Nurse Practitioner

## 2015-12-14 DIAGNOSIS — I10 Essential (primary) hypertension: Secondary | ICD-10-CM | POA: Insufficient documentation

## 2015-12-14 DIAGNOSIS — I87311 Chronic venous hypertension (idiopathic) with ulcer of right lower extremity: Secondary | ICD-10-CM | POA: Insufficient documentation

## 2015-12-14 DIAGNOSIS — I890 Lymphedema, not elsewhere classified: Secondary | ICD-10-CM | POA: Diagnosis not present

## 2015-12-14 DIAGNOSIS — L97212 Non-pressure chronic ulcer of right calf with fat layer exposed: Secondary | ICD-10-CM | POA: Diagnosis not present

## 2015-12-14 DIAGNOSIS — Z880 Allergy status to penicillin: Secondary | ICD-10-CM | POA: Insufficient documentation

## 2015-12-14 DIAGNOSIS — Z87891 Personal history of nicotine dependence: Secondary | ICD-10-CM | POA: Insufficient documentation

## 2015-12-14 DIAGNOSIS — M1990 Unspecified osteoarthritis, unspecified site: Secondary | ICD-10-CM | POA: Insufficient documentation

## 2015-12-14 DIAGNOSIS — E559 Vitamin D deficiency, unspecified: Secondary | ICD-10-CM | POA: Insufficient documentation

## 2015-12-14 DIAGNOSIS — S81812A Laceration without foreign body, left lower leg, initial encounter: Secondary | ICD-10-CM | POA: Insufficient documentation

## 2015-12-14 DIAGNOSIS — Z7984 Long term (current) use of oral hypoglycemic drugs: Secondary | ICD-10-CM | POA: Diagnosis not present

## 2015-12-14 DIAGNOSIS — E11622 Type 2 diabetes mellitus with other skin ulcer: Secondary | ICD-10-CM | POA: Diagnosis not present

## 2015-12-14 DIAGNOSIS — X58XXXA Exposure to other specified factors, initial encounter: Secondary | ICD-10-CM | POA: Insufficient documentation

## 2015-12-18 ENCOUNTER — Encounter: Payer: Medicare Other | Admitting: Physician Assistant

## 2015-12-18 ENCOUNTER — Encounter: Payer: Medicare Other | Admitting: Family Medicine

## 2015-12-18 NOTE — Progress Notes (Signed)
[name], [name] ([id number]) Visit Report for 12/14/2015 Arrival Information Details Patient Name: [name] Date of Service: 12/14/2015 11:00 AM Medical Record Number: [id number] Patient Account Number: [id number] Date of Birth/Sex: Sep 25, 1931 (82 y.o. Female) Treating RN: Montey Hora Primary Care Physician: [name] Other Clinician: Referring Physician: [name] Treating Physician/Extender: Loistine Chance in Treatment: 11 Visit Information History Since Last Visit Added or deleted any medications: No Patient Arrived: Cane Any new allergies or adverse reactions: No Arrival Time: 11:19 Had a fall or experienced change in No Accompanied By: dtr activities of daily living that may affect Transfer Assistance: None risk of falls: Patient Identification Verified: Yes Signs or symptoms of abuse/neglect since last No Secondary Verification Process Yes visito Completed: Hospitalized since last visit: No Patient Requires Transmission- No Pain Present Now: No Based Precautions: Patient Has Alerts: Yes Patient Alerts: Patient on Blood Thinner aspirin BMI 1.04 to rt lower leg Electronic Signature(s) Signed: 12/18/2015 1:45:28 PM By: Gretta Cool, RN, BSN, Kim RN, BSN Previous Signature: 12/14/2015 5:08:51 PM Version By: Montey Hora Entered By: Gretta Cool RN, BSN, Kim on 12/18/2015 09:09:25 [name] ([id number]) -------------------------------------------------------------------------------- Clinic Level of Care Assessment Details Patient Name: [name] Date of Service: 12/14/2015 11:00 AM Medical Record Number: [id number] Patient Account Number: [id number] Date of Birth/Sex: 1932-02-14 (82 y.o. Female) Treating RN: Montey Hora Primary Care Physician: [name] Other Clinician: Referring Physician: [name] Treating Physician/Extender: Loistine Chance in Treatment: 11 Clinic Level of Care Assessment  Items TOOL 4 Quantity Score _0  - Use when only an EandM is performed on FOLLOW-UP visit 0 ASSESSMENTS - Nursing Assessment / Reassessment X - Reassessment of Co-morbidities (includes updates in patient status) 1 10 X - Reassessment of Adherence to Treatment Plan 1 5 ASSESSMENTS - Wound and Skin Assessment / Reassessment X - Simple Wound Assessment / Reassessment - one wound 1 5 _1  - Complex Wound Assessment / Reassessment - multiple wounds 0 _2  - Dermatologic / Skin Assessment (not related to wound area) 0 ASSESSMENTS - Focused Assessment _3  - Circumferential Edema Measurements - multi extremities 0 _4  - Nutritional Assessment / Counseling / Intervention 0 X - Lower Extremity Assessment (monofilament, tuning fork, pulses) 1 5 _5  - Peripheral Arterial Disease Assessment (using hand held doppler) 0 ASSESSMENTS - Ostomy and/or Continence Assessment and Care _6  - Incontinence Assessment and Management 0 _7  - Ostomy Care Assessment and Management (repouching, etc.) 0 PROCESS - Coordination of Care X - Simple Patient / Family Education for ongoing care 1 15 _8  - Complex (extensive) Patient / Family Education for ongoing care 0 _9  - Staff obtains Programmer, systems, Records, Test Results / Process Orders 0 _10  - Staff telephones HHA, Nursing Homes / Clarify orders / etc 0 _11  - Routine Transfer to another Facility (non-emergent condition) 0 Villa, Daphna R. ([id number]) _12  - Routine Hospital Admission (non-emergent condition) 0 _13  - New Admissions / Biomedical engineer / Ordering NPWT, Apligraf, etc. 0 _14  - Emergency Hospital Admission (emergent condition) 0 X - Simple Discharge Coordination 1 10 _15  - Complex (extensive) Discharge Coordination 0 PROCESS - Special Needs _16  - Pediatric / Minor Patient Management 0 _17  - Isolation Patient Management 0 _18  - Hearing / Language / Visual special needs 0 _19  - Assessment of Community assistance (transportation, D/C planning, etc.) 0 _20  - Additional  assistance / Altered mentation 0 _21  - Support Surface(s) Assessment (bed, cushion, seat, etc.) 0 INTERVENTIONS - Wound Cleansing / Measurement X - Simple Wound Cleansing - one wound 1 5 _22  -  Complex Wound Cleansing - multiple wounds 0 X - Wound Imaging (photographs - any number of wounds) 1 5 _0  - Wound Tracing (instead of photographs) 0 X - Simple Wound Measurement - one wound 1 5 _1  - Complex Wound Measurement - multiple wounds 0 INTERVENTIONS - Wound Dressings X - Small Wound Dressing one or multiple wounds 1 10 _2  - Medium Wound Dressing one or multiple wounds 0 _3  - Large Wound Dressing one or multiple wounds 0 <IHKVQQVZDGLOVFIE>_3<\/PIRJJOACZYSAYTKZ>_6  - Application of Medications - topical 0 <WFUXNATFTDDUKGUR>_4<\/YHCWCBJSEGBTDVVO>_1  - Application of Medications - injection 0 INTERVENTIONS - Miscellaneous _6  - External ear exam 0 Hannibal, Kyasia R. ([id number]) _7  - Specimen Collection (cultures, biopsies, blood, body fluids, etc.) 0 _8  - Specimen(s) / Culture(s) sent or taken to Lab for analysis 0 _9  - Patient Transfer (multiple staff / Harrel Lemon Lift / Similar devices) 0 _10  - Simple Staple / Suture removal (25 or less) 0 _11  - Complex Staple / Suture removal (26 or more) 0 _12  - Hypo / Hyperglycemic Management (close monitor of Blood Glucose) 0 _13  - Ankle / Brachial Index (ABI) - do not check if billed separately 0 X - Vital Signs 1 5 Has the patient been seen at the hospital within the last three years: Yes Total Score: 80 Level Of Care: New/Established - Level 3 Electronic Signature(s) Signed: 12/18/2015 1:45:28 PM By: Gretta Cool, RN, BSN, Kim RN, BSN Previous Signature: 12/14/2015 5:08:51 PM Version By: Montey Hora Entered By: Gretta Cool RN, BSN, Kim on 12/18/2015 09:10:54 ELGA, SANTY ([id number]) -------------------------------------------------------------------------------- Encounter Discharge Information Details Patient Name: [name] Date of Service: 12/14/2015 11:00 AM Medical Record Number: [id number] Patient Account Number:  [id number] Date of Birth/Sex: Sep 09, 1931 (82 y.o. Female) Treating RN: Montey Hora Primary Care Physician: [name] Other Clinician: Referring Physician: [name] Treating Physician/Extender: Loistine Chance in Treatment: 11 Encounter Discharge Information Items Discharge Pain Level: 0 Discharge Condition: Stable Ambulatory Status: Cane Discharge Destination: Home Transportation: Private Auto Accompanied By: dtr Schedule Follow-up Appointment: Yes Medication Reconciliation completed No and provided to Patient/Care Siobahn Worsley: Provided on Clinical Summary of Care: 12/14/2015 Form Type Recipient Paper Patient bh Electronic Signature(s) Signed: 12/18/2015 1:45:28 PM By: Gretta Cool, RN, BSN, Kim RN, BSN Previous Signature: 12/14/2015 12:08:03 PM Version By: Montey Hora Entered By: Gretta Cool RN, BSN, Kim on 12/18/2015 09:12:52 [name] ([id number]) -------------------------------------------------------------------------------- Lower Extremity Assessment Details Patient Name: [name] Date of Service: 12/14/2015 11:00 AM Medical Record Number: [id number] Patient Account Number: [id number] Date of Birth/Sex: 02/21/1932 (82 y.o. Female) Treating RN: Montey Hora Primary Care Physician: [name] Other Clinician: Referring Physician: [name] Treating Physician/Extender: Loistine Chance in Treatment: 11 Edema Assessment Assessed: [Left: No] [Right: No] Edema: [Left: Ye] [Right: s] Vascular Assessment Pulses: Posterior Tibial Dorsalis Pedis Palpable: [Right:Yes] Extremity colors, hair growth, and conditions: Extremity Color: [Right:Mottled] Hair Growth on Extremity: [Right:No] Temperature of Extremity: [Right:Warm] Capillary Refill: [Right:< 3 seconds] Electronic Signature(s) Signed: 12/18/2015 1:45:28 PM By: Gretta Cool RN, BSN, Kim RN, BSN Signed: 12/18/2015 4:06:00 PM By: Montey Hora Previous Signature: 12/14/2015  5:08:51 PM Version By: Montey Hora Entered By: Gretta Cool RN, BSN, Kim on 12/18/2015 09:09:50 CARLEI, HUANG ([id number]) -------------------------------------------------------------------------------- Multi Wound Chart Details Patient Name: [name] Date of Service: 12/14/2015 11:00 AM Medical Record Number: [id number] Patient Account Number: [id number] Date of Birth/Sex: [birth year] (82 y.o. 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 ([id number]) Odor Anticipated Due to No N/A N/A Product Use: Wound Margin: Distinct, outline attached N/A N/A Granulation Amount: Large (67-100%) N/A N/A Granulation Quality: Red, Pink N/A N/A Necrotic Amount: Small (1-33%) N/A N/A Exposed Structures: Fascia: No N/A N/A Fat: No Tendon: No Muscle: No Joint: No Bone: No Limited to  Skin Breakdown Epithelialization: None N/A N/A Periwound Skin Texture: Edema: No N/A N/A Excoriation: No Induration: No Callus: No Crepitus: No Fluctuance: No Friable: No Rash: No Scarring: No Periwound Skin Maceration: No N/A N/A Moisture: Moist: No Dry/Scaly: No Periwound Skin Color: Atrophie Blanche: No N/A N/A Cyanosis: No Ecchymosis: No Erythema: No Hemosiderin Staining: No Mottled: No Pallor: No Rubor: No Temperature: No Abnormality N/A N/A Tenderness on Yes N/A N/A Palpation: Wound Preparation: Ulcer Cleansing: N/A N/A Rinsed/Irrigated with Saline Topical Anesthetic Applied: Other: lidocaine 4% Treatment Notes Wound #3 (Right, Anterior Lower Leg) 1. Cleansed with: Clean wound with Normal Saline Supan, Lauryn R. ([id number]) 2. Anesthetic Topical Lidocaine 4% cream to wound bed prior to debridement 4. Dressing Applied: Prisma Ag 5. Secondary Dressing Applied Non-Adherent pad Gauze and Kerlix/Conform 7. Secured with Tape Notes Horticulturist, commercial) Signed: 12/18/2015 1:45:28 PM By: Gretta Cool, RN, BSN, Kim RN, BSN Previous Signature: 12/14/2015 5:08:51 PM Version By: Montey Hora Entered By: Gretta Cool RN, BSN, Kim on 12/18/2015 09:10:26 LEYLANIE, WOODMANSEE ([id number]) -------------------------------------------------------------------------------- Multi-Disciplinary Care Plan Details Patient Name: [name] Date of Service: 12/14/2015 11:00 AM Medical Record Number: [id number] Patient Account Number: [id number] Date of Birth/Sex: [birth year] (82 y.o. Female) Treating RN: Montey Hora Primary Care Physician: [name] Other Clinician: Referring Physician: [name] Treating Physician/Extender: Loistine Chance in Treatment: 11 Active Inactive Abuse / Safety / Falls / Self Care Management Nursing Diagnoses: Potential for falls Goals: Patient/caregiver will verbalize understanding of skin care regimen Date  Initiated: 09/28/2015 Goal Status: Active Patient/caregiver will verbalize/demonstrate measures taken to prevent injury and/or falls Date Initiated: 09/28/2015 Goal Status: Active Interventions: Assess fall risk on admission and as needed Assess: immobility, friction, shearing, incontinence upon admission and as needed Assess impairment of mobility on admission and as needed per policy Assess self care needs on admission and as needed Provide education on fall prevention Notes: Orientation to the Wound Care Program Nursing Diagnoses: Knowledge deficit related to the wound healing center program Goals: Patient/caregiver will verbalize understanding of the [place] [place] [facility] Date Initiated: 09/28/2015 Goal Status: Active Interventions: Provide education on orientation to the wound center Notes: Brickhouse, Kyandra R. ([id number]) Pain, Acute or Chronic Nursing Diagnoses: Potential alteration in comfort, pain Goals: Patient will verbalize adequate pain control and receive pain control interventions during procedures as needed Date Initiated: 09/28/2015 Goal  Status: Active Patient/caregiver will verbalize adequate pain control between visits Date Initiated: 09/28/2015 Goal Status: Active Patient/caregiver will verbalize comfort level met Date Initiated: 09/28/2015 Goal Status: Active Interventions: Assess comfort goal upon admission Complete pain assessment as per visit requirements Encourage patient to take pain medications as prescribed Notes: Wound/Skin Impairment Nursing Diagnoses: Impaired tissue integrity Goals: Ulcer/skin breakdown will heal within 14 weeks Date Initiated: 09/28/2015 Goal Status: Active Interventions: Assess patient/caregiver ability to obtain necessary supplies Assess patient/caregiver ability to perform ulcer/skin care regimen upon admission and as needed Provide education on ulcer and skin care Notes: Electronic Signature(s) Signed:  12/18/2015 1:45:28 PM By: Gretta Cool, RN, BSN, Kim RN, BSN Signed: 12/18/2015 4:06:00 PM By: Montey Hora Previous Signature: 12/14/2015 5:08:51 PM Version By: Montey Hora Entered By: Gretta Cool RN, BSN, Kim on 12/18/2015 09:10:10 MACENZIE, BURFORD ([id number]) CHRISOULA, ZEGARRA ([id number]) -------------------------------------------------------------------------------- Pain Assessment Details Patient Name: [name] Date of Service: 12/14/2015 11:00 AM Medical Record Number: [id number] Patient Account Number: [id number] Date of Birth/Sex: 10-27-1931 (82 y.o. Female) Treating RN: Montey Hora Primary Care Physician: [name] Other Clinician: Referring Physician: [name] Treating Physician/Extender: Loistine Chance in Treatment: 11 Active Problems Location of Pain Severity and Description of Pain Patient Has Paino Yes Site Locations Pain Location: Pain in Ulcers With Dressing Change: Yes Duration of the Pain. Constant / Intermittento Constant Character of Pain Describe the Pain: Aching Pain Management and Medication Current Pain Management: Notes Topical or injectable lidocaine is offered to patient for acute pain when surgical debridement is performed. If needed, Patient is instructed to use over the counter pain medication for the following 24-48 hours after debridement. Wound care MDs do not prescribed pain medications. Patient has chronic pain or uncontrolled pain. Patient has been instructed to make an appointment with their Primary Care Physician for pain management. Electronic Signature(s) Signed: 12/18/2015 1:45:28 PM By: Gretta Cool RN, BSN, Kim RN, BSN Signed: 12/18/2015 4:06:00 PM By: Montey Hora Previous Signature: 12/14/2015 5:08:51 PM Version By: Montey Hora Entered By: Gretta Cool RN, BSN, Kim on 12/18/2015 09:09:33 ORVILLE, WIDMANN  ([id number]) -------------------------------------------------------------------------------- Patient/Caregiver Education Details Patient Name: [name] Date of Service: 12/14/2015 11:00 AM Medical Record Number: [id number] Patient Account Number: [id number] Date of Birth/Gender: 09-15-1931 (82 y.o. Female) Treating RN: Montey Hora Primary Care Physician: [name] Other Clinician: Referring Physician: [name] Treating Physician/Extender: Loistine Chance in Treatment: 11 Education Assessment Education Provided To: Patient Education Topics Provided Wound/Skin Impairment: Handouts: Other: wound care to continue as ordered Methods: Demonstration, Explain/Verbal Responses: State content correctly Electronic Signature(s) Signed: 12/18/2015 1:45:28 PM By: Gretta Cool, RN, BSN, Kim RN, BSN Previous Signature: 12/14/2015 5:08:51 PM Version By: Montey Hora Entered By: Gretta Cool RN, BSN, Kim on 12/18/2015 09:13:00 LERLENE, TREADWELL ([id number]) -------------------------------------------------------------------------------- Wound Assessment Details Patient Name: [name] Date of Service: 12/14/2015 11:00 AM Medical Record Number: [id number] Patient Account Number: [id number] Date of Birth/Sex: 1931/06/15 (82 y.o. Female) Treating RN: Montey Hora Primary Care Physician: [name] Other Clinician: Referring Physician: [name] Treating Physician/Extender: Frann Rider in Treatment: 11 Wound Status Wound Number: 3 Primary Diabetic Wound/Ulcer of the Lower Etiology: Extremity Wound Location: Right Lower Leg - Anterior Secondary Venous Leg Ulcer Wounding Event: Trauma Etiology: Date Acquired: 09/15/2015 Wound Status: Open Weeks Of Treatment: 11 Comorbid Cataracts, Hypertension, Colitis, Clustered Wound: No History: Type II Diabetes, Osteoarthritis Photos Wound Measurements Length: (cm) 1.1 Width: (cm) 0.7 Depth: (cm)  0.2 Area: (cm) 0.605 Volume: (cm) 0.121 % Reduction in Area: 98.9% % Reduction  in Volume: 97.9% Epithelialization: None Tunneling: No Undermining: No Wound Description Classification: Grade 1 Foul Odor Af Wound Margin: Distinct, outline attached Due to Produ Exudate Amount: Large Exudate Type: Serosanguineous Exudate Color: red, brown ter Cleansing: Yes ct Use: No Wound Bed Granulation Amount: Large (67-100%) Exposed Structure Granulation Quality: Red, Pink Fascia Exposed: No Necrotic Amount: Small (1-33%) Fat Layer Exposed: No Necrotic Quality: Adherent Slough Tendon Exposed: No Muscle Exposed: No Nolting, Acsa R. ([id number]) Joint Exposed: No Bone Exposed: No Limited to Skin Breakdown Periwound Skin Texture Texture Color No Abnormalities Noted: No No Abnormalities Noted: No Callus: No Atrophie Blanche: No Crepitus: No Cyanosis: No Excoriation: No Ecchymosis: No Fluctuance: No Erythema: No Friable: No Hemosiderin Staining: No Induration: No Mottled: No Localized Edema: No Pallor: No Rash: No Rubor: No Scarring: No Temperature / Pain Moisture Temperature: No Abnormality No Abnormalities Noted: No Tenderness on Palpation: Yes Dry / Scaly: No Maceration: No Moist: No Wound Preparation Ulcer Cleansing: Rinsed/Irrigated with Saline Topical Anesthetic Applied: Other: lidocaine 4%, Treatment Notes Wound #3 (Right, Anterior Lower Leg) 1. Cleansed with: Clean wound with Normal Saline 2. Anesthetic Topical Lidocaine 4% cream to wound bed prior to debridement 4. Dressing Applied: Prisma Ag 5. Secondary Dressing Applied Non-Adherent pad Gauze and Kerlix/Conform 7. Secured with Tape Notes netting Electronic Signature(s) Signed: 12/14/2015 5:08:51 PM By: Montey Hora Entered By: Montey Hora on 12/14/2015 11:36:05 SAARAH, DEWING ([id number]) YUKIKO, MINNICH  ([id number]) -------------------------------------------------------------------------------- Vitals Details Patient Name: [name] Date of Service: 12/14/2015 11:00 AM Medical Record Number: [id number] Patient Account Number: [id number] Date of Birth/Sex: [birth year] (82 y.o. Female) Treating RN: Montey Hora Primary Care Physician: [name] Other Clinician: Referring Physician: [name] Treating Physician/Extender: Loistine Chance in Treatment: 11 Vital Signs Time Taken: 11:20 Temperature (F): 97.3 Height (in): 61 Pulse (bpm): 57 Weight (lbs): 170 Respiratory Rate (breaths/min): 18 Body Mass Index (BMI): 32.1 Blood Pressure (mmHg): 135/58 Reference Range: 80 - 120 mg / dl Electronic Signature(s) Signed: 12/18/2015 1:45:28 PM By: Gretta Cool, RN, BSN, Kim RN, BSN Previous Signature: 12/14/2015 5:08:51 PM Version By: Montey Hora Entered By: Gretta Cool RN, BSN, Kim on 12/18/2015 09:09:41

## 2015-12-18 NOTE — Progress Notes (Addendum)
[name] ([id number]) Visit Report for 12/14/2015 Chief Complaint Document Details Patient Name: [name], [name] 12/14/2015 11:00 Date of Service: AM Medical Record [id number] Number: Patient Account Number: [id number] 19-Apr-1931 (82 y.o. Treating RN: Montey Hora Date of Birth/Sex: Female) Other Clinician: Primary Care Physician: [name] Treating Londell Moh Referring Physician: [name] Physician/Extender: Suella Grove in Treatment: 11 Information Obtained from: Patient Chief Complaint Patient presents to the wound care center for a consult due non healing wound to the right lower extremity which she's had for about 3 weeks now Electronic Signature(s) Signed: 12/18/2015 12:39:05 PM By: Londell Moh FNP Signed: 12/18/2015 1:45:28 PM By: Gretta Cool RN, BSN, Kim RN, BSN Previous Signature: 12/14/2015 4:21:25 PM Version By: Londell Moh FNP Entered By: Gretta Cool RN, BSN, Kim on 12/18/2015 09:11:30 [name], [name] ([id number]) -------------------------------------------------------------------------------- HPI Details Patient Name: [name], [name] 12/14/2015 11:00 Date of Service: AM Medical Record [id number] Number: Patient Account Number: [id number] [birth year] (82 y.o. Treating RN: Montey Hora Date of Birth/Sex: Female) Other Clinician: Primary Care Physician: [name] Treating Londell Moh Referring Physician: [name] Physician/Extender: Suella Grove in Treatment: 11 History of Present Illness Location: lacerated wound to the right lower extremity Quality: Patient reports experiencing a dull pain to affected area(s). Severity: Patient states wound (s) are getting better. Duration: Patient has had the wound for < 3 weeks prior to presenting for treatment Timing: Pain in wound is Intermittent (comes and goes Context: The wound occurred when the patient had a blunt injury which caused a laceration to the right lower  extremity Modifying Factors: Other treatment(s) tried include:was seen in the ER on 09/15/2015 and sutured with 4-0 nylon sutures. Associated Signs and Symptoms: Patient reports having increase swelling. HPI Description: 82 year old patient who had an injury to her right lower extremity on July 8 while she was in [place] boarding a plane. She was treated at [facility] with sutures being placed on 09/15/2015. She was recently seen last week by her PCP edema of the feet and legs and after review as advised to take Lasix 20 mg daily. past hemoglobin A1c done in June 2017 was 9.1% Her past medical history is significant for diabetes mellitus, arthritis, basal cell carcinoma, collagenous colitis, essential hypertension, avitaminosis D. She is also status post abdominal hysterectomy, lumbar discectomy, neck surgery. She is a former smoker and quit in 1979. Of note in the recent past she was seen by Dr. Lucky Cowboy for what seems a sounds like injections of varicose veins. This was only around her ankles and not in the lower extremities. No Doppler studies were done. She was not advised to wear any compression stockings. 11/02/2015 -- was admitted to the hospital between 8/18 and 10/28/2015 for a syncopal attack and was found to have acute renal failure, UTI and her x-rays checked out okay. She was evaluated and discharged home on Keflex which she is completing today. 11/23/2015 -- recently discharged out of hospital after spending 2 days for a workup for seizures and unspecified syncope. MRI of the brain showed no acute abnormality and EEG is still pending and carotid Dopplers did not show any abnormality. Neurology recommended no antiepileptics and there was no arrhythmia noted on telemetry, echo was normal and a Halter monitor was recommended as an outpatient. 11/30/2015 -- her co- payment for her skin substitute was over $350 per visit and she has declined this. 12/14/15: wound continues to improve. no  fevers, chills, body aches or malaise. no interval changes regarding health status. [name], [name] ([id number]) Electronic Signature(s)  Signed: 12/18/2015 12:39:05 PM By: Londell Moh FNP Signed: 12/18/2015 1:45:28 PM By: Gretta Cool RN, BSN, Kim RN, BSN Previous Signature: 12/14/2015 4:21:25 PM Version By: Londell Moh FNP Entered By: Gretta Cool RN, BSN, Kim on 12/18/2015 09:11:37 [name], [name] ([id number]) -------------------------------------------------------------------------------- Physical Exam Details Patient Name: [name] 12/14/2015 11:00 Date of Service: AM Medical Record [id number] Number: Patient Account Number: [id number] April 08, 1931 (82 y.o. Treating RN: Montey Hora Date of Birth/Sex: Female) Other Clinician: Primary Care Physician: [name] Treating Londell Moh Referring Physician: [name] Physician/Extender: Suella Grove in Treatment: 11 Constitutional Patient's appearance is neat and clean. Appears in no acute distress. Well nourished and well developed.. Ears, Nose, Mouth, and Throat Patient can hear normal speaking tones without difficulty.Marland Kitchen Respiratory Respiratory effort is easy and symmetric bilaterally. Rate is normal at rest and on room air.. Cardiovascular no edema. Peripheral pulses palpable at 1 +. Capillary refill < 3 seconds.. Psychiatric Judgement and insight intact.. Alert and oriented times 3.. Short and long term memory intact.. No evidence of depression, anxiety, or agitation. Calm, cooperative, and communicative. Appropriate interactions and affect.. Electronic Signature(s) Signed: 12/18/2015 12:39:05 PM By: Londell Moh FNP Signed: 12/18/2015 1:45:28 PM By: Gretta Cool RN, BSN, Kim RN, BSN Previous Signature: 12/14/2015 4:21:25 PM Version By: Londell Moh FNP Entered By: Gretta Cool RN, BSN, Kim on 12/18/2015 09:11:45 [name], [name]  ([id number]) -------------------------------------------------------------------------------- Physician Orders Details Patient Name: [name], [name] 12/14/2015 11:00 Date of Service: AM Medical Record [id number] Number: Patient Account Number: [id number] [birth year] (82 y.o. Treating RN: Montey Hora Date of Birth/Sex: Female) Other Clinician: Primary Care Physician: [name] Treating Londell Moh Referring Physician: [name] Physician/Extender: Suella Grove in Treatment: 11 Verbal / Phone Orders: Yes Clinician: Montey Hora Read Back and Verified: Yes Diagnosis Coding Wound Cleansing Wound #3 Right,Anterior Lower Leg o Clean wound with Normal Saline. o Cleanse wound with mild soap and water o May Shower, gently pat wound dry prior to applying new dressing. Anesthetic Wound #3 Right,Anterior Lower Leg o Topical Lidocaine 4% cream applied to wound bed prior to debridement - for clinic use Primary Wound Dressing Wound #3 Right,Anterior Lower Leg o Prisma Ag Secondary Dressing Wound #3 Right,Anterior Lower Leg o Conform/Kerlix - conform netting and tape o Non-adherent pad Dressing Change Frequency Wound #3 Right,Anterior Lower Leg o Change dressing every other day. Follow-up Appointments Wound #3 Right,Anterior Lower Leg o Return Appointment in 1 week. Additional Orders / Instructions Wound #3 Right,Anterior Lower Leg o Increase protein intake. Medications-please add to medication list. [name], [name] ([id number]) Wound #3 Right,Anterior Lower Leg o Other: - Vitamin A, Vitamin C, Zinc, Multivitamin Electronic Signature(s) Signed: 12/18/2015 12:39:05 PM By: Londell Moh FNP Signed: 12/18/2015 1:45:28 PM By: Gretta Cool RN, BSN, Kim RN, BSN Previous Signature: 12/14/2015 5:08:51 PM Version By: Montey Hora Entered By: Gretta Cool RN, BSN, Kim on 12/18/2015 09:10:45 [name], [name]  ([id number]) -------------------------------------------------------------------------------- Problem List Details Patient Name: [name], [name] 12/14/2015 11:00 Date of Service: AM Medical Record [id number] Number: Patient Account Number: [id number] 1931/09/23 (82 y.o. Treating RN: Montey Hora Date of Birth/Sex: Female) Other Clinician: Primary Care Physician: [name] Treating Londell Moh Referring Physician: [name] Physician/Extender: Suella Grove in Treatment: 11 Active Problems ICD-10 Encounter Code Description Active Date Diagnosis E11.622 Type 2 diabetes mellitus with other skin ulcer 11/09/2015 Yes S81.811A Laceration without foreign body, right lower leg, initial 11/09/2015 Yes encounter L97.212 Non-pressure chronic ulcer of right calf with fat layer 11/09/2015 Yes exposed I87.311 Chronic venous hypertension (idiopathic) with ulcer of 11/09/2015 Yes right lower  extremity I89.0 Lymphedema, not elsewhere classified 11/09/2015 Yes Inactive Problems Resolved Problems Electronic Signature(s) Signed: 12/18/2015 12:39:05 PM By: Londell Moh FNP Signed: 12/18/2015 1:45:28 PM By: Gretta Cool RN, BSN, Kim RN, BSN Previous Signature: 12/14/2015 4:21:25 PM Version By: Londell Moh FNP Entered By: Gretta Cool RN, BSN, Kim on 12/18/2015 09:11:11 WAVEL, LOKKEN ([id number]) -------------------------------------------------------------------------------- Progress Note Details Patient Name: [name], [name] 12/14/2015 11:00 Date of Service: AM Medical Record [id number] Number: Patient Account Number: [id number] September 21, 1931 (82 y.o. Treating RN: Montey Hora Date of Birth/Sex: Female) Other Clinician: Primary Care Physician: [name] Treating Londell Moh Referring Physician: [name] Physician/Extender: Suella Grove in Treatment: 11 Subjective Chief Complaint Information obtained from Patient Patient presents to the wound care center for a consult  due non healing wound to the right lower extremity which she's had for about 3 weeks now History of Present Illness (HPI) The following HPI elements were documented for the patient's wound: Location: lacerated wound to the right lower extremity Quality: Patient reports experiencing a dull pain to affected area(s). Severity: Patient states wound (s) are getting better. Duration: Patient has had the wound for < 3 weeks prior to presenting for treatment Timing: Pain in wound is Intermittent (comes and goes Context: The wound occurred when the patient had a blunt injury which caused a laceration to the right lower extremity Modifying Factors: Other treatment(s) tried include:was seen in the ER on 09/15/2015 and sutured with 4-0 nylon sutures. Associated Signs and Symptoms: Patient reports having increase swelling. 82 year old patient who had an injury to her right lower extremity on July 8 while she was in [place] boarding a plane. She was treated at [facility] with sutures being placed on 09/15/2015. She was recently seen last week by her PCP edema of the feet and legs and after review as advised to take Lasix 20 mg daily. past hemoglobin A1c done in June 2017 was 9.1% Her past medical history is significant for diabetes mellitus, arthritis, basal cell carcinoma, collagenous colitis, essential hypertension, avitaminosis D. She is also status post abdominal hysterectomy, lumbar discectomy, neck surgery. She is a former smoker and quit in 1979. Of note in the recent past she was seen by Dr. Lucky Cowboy for what seems a sounds like injections of varicose veins. This was only around her ankles and not in the lower extremities. No Doppler studies were done. She was not advised to wear any compression stockings. 11/02/2015 -- was admitted to the hospital between 8/18 and 10/28/2015 for a syncopal attack and was found to have acute renal failure, UTI and her x-rays checked out okay. She was evaluated and  discharged home on Keflex which she is completing today. 11/23/2015 -- recently discharged out of hospital after spending 2 days for a workup for seizures and unspecified syncope. MRI of the brain showed no acute abnormality and EEG is still pending and carotid Credeur, Sarahgrace R. ([id number]) Dopplers did not show any abnormality. Neurology recommended no antiepileptics and there was no arrhythmia noted on telemetry, echo was normal and a Halter monitor was recommended as an outpatient. 11/30/2015 -- her co- payment for her skin substitute was over $350 per visit and she has declined this. 12/14/15: wound continues to improve. no fevers, chills, body aches or malaise. no interval changes regarding health status. Objective Constitutional Patient's appearance is neat and clean. Appears in no acute distress. Well nourished and well developed.. Vitals Time Taken: 11:20 AM, Height: 61 in, Weight: 170 lbs, BMI: 32.1, Temperature: 97.3 F, Pulse: 57 bpm, Respiratory Rate: 18  breaths/min, Blood Pressure: 135/58 mmHg. Ears, Nose, Mouth, and Throat Patient can hear normal speaking tones without difficulty.Marland Kitchen Respiratory Respiratory effort is easy and symmetric bilaterally. Rate is normal at rest and on room air.. Cardiovascular no edema. Peripheral pulses palpable at 1 +. Capillary refill < 3 seconds.. Psychiatric Judgement and insight intact.. Alert and oriented times 3.. Short and long term memory intact.. No evidence of depression, anxiety, or agitation. Calm, cooperative, and communicative. Appropriate interactions and affect.. Integumentary (Hair, Skin) Wound #3 status is Open. Original cause of wound was Trauma. The wound is located on the Right,Anterior Lower Leg. The wound measures 1.1cm length x 0.7cm width x 0.2cm depth; 0.605cm^2 area and 0.121cm^3 volume. The wound is limited to skin breakdown. There is no tunneling or undermining noted. There is a large amount of serosanguineous  drainage noted. The wound margin is distinct with the outline attached to the wound base. There is large (67-100%) red, pink granulation within the wound bed. There is a small (1-33%) amount of necrotic tissue within the wound bed including Adherent Slough. The periwound skin appearance did not exhibit: Callus, Crepitus, Excoriation, Fluctuance, Friable, Induration, Localized Edema, Rash, Scarring, Dry/Scaly, Maceration, Moist, Atrophie Blanche, Cyanosis, Ecchymosis, Hemosiderin Staining, Mottled, Pallor, Rubor, Erythema. Periwound temperature was noted as No Abnormality. The periwound has tenderness on palpation. TAMIYA, CISNERO ([id number]) Assessment Active Problems ICD-10 E11.622 - Type 2 diabetes mellitus with other skin ulcer S81.811A - Laceration without foreign body, right lower leg, initial encounter L97.212 - Non-pressure chronic ulcer of right calf with fat layer exposed I87.311 - Chronic venous hypertension (idiopathic) with ulcer of right lower extremity I89.0 - Lymphedema, not elsewhere classified Diagnoses ICD-10 E11.622: Type 2 diabetes mellitus with other skin ulcer S81.811A: Laceration without foreign body, right lower leg, initial encounter L97.212: Non-pressure chronic ulcer of right calf with fat layer exposed I87.311: Chronic venous hypertension (idiopathic) with ulcer of right lower extremity I89.0: Lymphedema, not elsewhere classified Plan Wound Cleansing: Wound #3 Right,Anterior Lower Leg: Clean wound with Normal Saline. Cleanse wound with mild soap and water May Shower, gently pat wound dry prior to applying new dressing. Anesthetic: Wound #3 Right,Anterior Lower Leg: Topical Lidocaine 4% cream applied to wound bed prior to debridement - for clinic use Primary Wound Dressing: Wound #3 Right,Anterior Lower Leg: Prisma Ag Secondary Dressing: Wound #3 Right,Anterior Lower Leg: Conform/Kerlix - conform netting and tape Non-adherent pad Dressing Change  Frequency: Wound #3 Right,Anterior Lower Leg: Change dressing every other day. Follow-up Appointments: Wound #3 Right,Anterior Lower Leg: Return Appointment in 1 week. Additional Orders / Instructions: TRINAE, ALVARES R. ([id number]) Wound #3 Right,Anterior Lower Leg: Increase protein intake. Medications-please add to medication list.: Wound #3 Right,Anterior Lower Leg: Other: - Vitamin A, Vitamin C, Zinc, Multivitamin Follow-Up Appointments: A follow-up appointment should be scheduled. A Patient Clinical Summary of Care was provided to bh 1. discussed clinical findings and implications with pt and daughter. all questions were answered. Electronic Signature(s) Signed: 01/02/2016 3:45:39 PM By: Londell Moh FNP Previous Signature: 12/18/2015 12:39:05 PM Version By: Londell Moh FNP Previous Signature: 12/18/2015 1:45:28 PM Version By: Gretta Cool RN, BSN, Kim RN, BSN Previous Signature: 12/14/2015 4:21:25 PM Version By: Londell Moh FNP Entered By: Londell Moh on 01/02/2016 15:45:39 Barbeau, Clemens Catholic ([id number]) -------------------------------------------------------------------------------- [place] Details Patient Name: [name] Date of Service: 12/14/2015 Medical Record Number: [id number] Patient Account Number: [id number] Date of Birth/Sex: 02-06-1932 (82 y.o. Female) Treating RN: Montey Hora Primary Care Physician: [name] Other Clinician: Referring Physician: [name] Treating Physician/Extender:  Londell Moh Weeks in Treatment: 11 Diagnosis Coding ICD-10 Codes Code Description E11.622 Type 2 diabetes mellitus with other skin ulcer S81.811A Laceration without foreign body, right lower leg, initial encounter L97.212 Non-pressure chronic ulcer of right calf with fat layer exposed I87.311 Chronic venous hypertension (idiopathic) with ulcer of right lower extremity I89.0 Lymphedema, not elsewhere classified Facility  Procedures CPT4 Code: [id number] Description: 99213 - WOUND CARE VISIT-LEV 3 EST PT Modifier: Quantity: 1 Physician Procedures CPT4 Code Description: [id number] - WC PHYS LEVEL 2 - EST PT ICD-10 Description Diagnosis S81.811A Laceration without foreign body, right lower leg, init E11.622 Type 2 diabetes mellitus with other skin ulcer L97.212 Non-pressure chronic ulcer of  right calf with fat laye Modifier: ial encounte r exposed Quantity: 1 r Electronic Signature(s) Signed: 12/18/2015 12:39:05 PM By: Londell Moh FNP Signed: 12/18/2015 1:45:28 PM By: Gretta Cool RN, BSN, Kim RN, BSN Previous Signature: 12/14/2015 4:21:25 PM Version By: Londell Moh FNP Entered By: Gretta Cool RN, BSN, Kim on 12/18/2015 09:12:06

## 2015-12-21 ENCOUNTER — Encounter: Payer: Medicare Other | Admitting: Nurse Practitioner

## 2015-12-21 DIAGNOSIS — E11622 Type 2 diabetes mellitus with other skin ulcer: Secondary | ICD-10-CM | POA: Diagnosis not present

## 2015-12-22 NOTE — Progress Notes (Signed)
[name], [name] ([id number]) Visit Report for 12/21/2015 Chief Complaint Document Details Patient Name: [name], [name] 12/21/2015 11:00 Date of Service: AM Medical Record [id number] Number: Patient Account Number: [id number] 1931-07-20 (82 y.o. Treating RN: Baruch Gouty, RN, BSN, Velva Harman Date of Birth/Sex: Female) Other Clinician: Primary Care Physician: [name] Treating Londell Moh Referring Physician: [name] Physician/Extender: Suella Grove in Treatment: 12 Information Obtained from: Patient Chief Complaint Patient presents to the wound care center for a consult due non healing wound to the right lower extremity which she's had for about 3 weeks now Electronic Signature(s) Signed: 12/21/2015 5:31:30 PM By: Londell Moh FNP Entered By: Londell Moh on 12/21/2015 12:32:40 [place], [name] ([id number]) -------------------------------------------------------------------------------- HPI Details Patient Name: [name] Austria R. 12/21/2015 11:00 Date of Service: AM Medical Record [id number] Number: Patient Account Number: [id number] [birth year] (82 y.o. Treating RN: Baruch Gouty, RN, BSN, Velva Harman Date of Birth/Sex: Female) Other Clinician: Primary Care Physician: [name] Treating Londell Moh Referring Physician: [name] Physician/Extender: Suella Grove in Treatment: 12 History of Present Illness Location: lacerated wound to the right lower extremity Quality: Patient reports experiencing a dull pain to affected area(s). Severity: Patient states wound (s) are getting better. Duration: Patient has had the wound for < 3 weeks prior to presenting for treatment Timing: Pain in wound is Intermittent (comes and goes Context: The wound occurred when the patient had a blunt injury which caused a laceration to the right lower extremity Modifying Factors: Other treatment(s) tried include:was seen in the ER on 09/15/2015 and sutured with 4-0 nylon  sutures. Associated Signs and Symptoms: Patient reports having increase swelling. HPI Description: 82 year old patient who had an injury to her right lower extremity on July 8 while she was in [place] boarding a plane. She was treated at [facility] with sutures being placed on 09/15/2015. She was recently seen last week by her PCP edema of the feet and legs and after review as advised to take Lasix 20 mg daily. past hemoglobin A1c done in June 2017 was 9.1% Her past medical history is significant for diabetes mellitus, arthritis, basal cell carcinoma, collagenous colitis, essential hypertension, avitaminosis D. She is also status post abdominal hysterectomy, lumbar discectomy, neck surgery. She is a former smoker and quit in 1979. Of note in the recent past she was seen by Dr. Lucky Cowboy for what seems a sounds like injections of varicose veins. This was only around her ankles and not in the lower extremities. No Doppler studies were done. She was not advised to wear any compression stockings. 11/02/2015 -- was admitted to the hospital between 8/18 and 10/28/2015 for a syncopal attack and was found to have acute renal failure, UTI and her x-rays checked out okay. She was evaluated and discharged home on Keflex which she is completing today. 11/23/2015 -- recently discharged out of hospital after spending 2 days for a workup for seizures and unspecified syncope. MRI of the brain showed no acute abnormality and EEG is still pending and carotid Dopplers did not show any abnormality. Neurology recommended no antiepileptics and there was no arrhythmia noted on telemetry, echo was normal and a Halter monitor was recommended as an outpatient. 11/30/2015 -- her co- payment for her skin substitute was over $350 per visit and she has declined this. 12/14/15: wound continues to improve. no fevers, chills, body aches or malaise. no interval changes regarding health status. 12/21/15: returns today for f/u. wound  improving. no systemic s/s of infection. no interval changes regarding health status. she is hoping her wound is healed  prior to vacation 01/09/16. [name], [name] ([id number]) Electronic Signature(s) Signed: 12/21/2015 5:31:30 PM By: Londell Moh FNP Entered By: Londell Moh on 12/21/2015 12:33:39 [name], [name] ([id number]) -------------------------------------------------------------------------------- Physical Exam Details Patient Name: [name], [name] 12/21/2015 11:00 Date of Service: AM Medical Record [id number] Number: Patient Account Number: [id number] [birth year] (82 y.o. Treating RN: Baruch Gouty, RN, BSN, Velva Harman Date of Birth/Sex: Female) Other Clinician: Primary Care Physician: [name] Treating Londell Moh Referring Physician: [name] Physician/Extender: Suella Grove in Treatment: 12 Constitutional Patient's appearance is neat and clean. Appears in no acute distress. Well nourished and well developed.. Ears, Nose, Mouth, and Throat Patient can hear normal speaking tones without difficulty.. Cardiovascular Pedal pulses palpable and strong bilaterally.. Extremities are free of varicosities, clubbing or edema. Peripheral pulses strong and equal. Capillary refill < 3 seconds.. Psychiatric Judgement and insight intact.. Alert and oriented times 3.. Short and long term memory intact.. No evidence of depression, anxiety, or agitation. Calm, cooperative, and communicative. Appropriate interactions and affect.. Electronic Signature(s) Signed: 12/21/2015 5:31:30 PM By: Londell Moh FNP Entered By: Londell Moh on 12/21/2015 12:34:05 [name], [name] ([id number]) -------------------------------------------------------------------------------- Physician Orders Details Patient Name: [name], [name] 12/21/2015 11:00 Date of Service: AM Medical Record [id number] Number: Patient Account Number: [id number] [birth year] (82 y.o. Treating RN: Baruch Gouty,  RN, BSN, Velva Harman Date of Birth/Sex: Female) Other Clinician: Primary Care Physician: [name] Treating Londell Moh Referring Physician: [name] Physician/Extender: Suella Grove in Treatment: 12 Verbal / Phone Orders: Yes Clinician: Afful, RN, BSN, Rita Read Back and Verified: Yes Diagnosis Coding ICD-10 Coding Code Description E11.622 Type 2 diabetes mellitus with other skin ulcer S81.811A Laceration without foreign body, right lower leg, initial encounter L97.212 Non-pressure chronic ulcer of right calf with fat layer exposed I87.311 Chronic venous hypertension (idiopathic) with ulcer of right lower extremity I89.0 Lymphedema, not elsewhere classified Wound Cleansing Wound #3 Right,Anterior Lower Leg o Clean wound with Normal Saline. o Cleanse wound with mild soap and water o May Shower, gently pat wound dry prior to applying new dressing. Anesthetic Wound #3 Right,Anterior Lower Leg o Topical Lidocaine 4% cream applied to wound bed prior to debridement - for clinic use Primary Wound Dressing Wound #3 Right,Anterior Lower Leg o Prisma Ag Secondary Dressing Wound #3 Right,Anterior Lower Leg o Conform/Kerlix - conform netting and tape o Non-adherent pad Dressing Change Frequency Wound #3 Right,Anterior Lower Leg o Change dressing every other day. [name], [name] ([id number]) Follow-up Appointments Wound #3 Right,Anterior Lower Leg o Return Appointment in 1 week. Additional Orders / Instructions Wound #3 Right,Anterior Lower Leg o Increase protein intake. Medications-please add to medication list. Wound #3 Right,Anterior Lower Leg o Other: - Vitamin A, Vitamin C, Zinc, Multivitamin Electronic Signature(s) Signed: 12/21/2015 5:31:30 PM By: Londell Moh FNP Entered By: Londell Moh on 12/21/2015 12:35:56 [name], [name] ([id number]) -------------------------------------------------------------------------------- Problem  List Details Patient Name: [name] Austria R. 12/21/2015 11:00 Date of Service: AM Medical Record [id number] Number: Patient Account Number: [id number] 07-04-1931 (82 y.o. Treating RN: Afful, RN, BSN, Velva Harman Date of Birth/Sex: Female) Other Clinician: Primary Care Physician: [name] Treating Londell Moh Referring Physician: [name] Physician/Extender: Suella Grove in Treatment: 12 Active Problems ICD-10 Encounter Code Description Active Date Diagnosis E11.622 Type 2 diabetes mellitus with other skin ulcer 11/09/2015 Yes S81.811A Laceration without foreign body, right lower leg, initial 11/09/2015 Yes encounter L97.212 Non-pressure chronic ulcer of right calf with fat layer 11/09/2015 Yes exposed I87.311 Chronic venous hypertension (idiopathic) with ulcer of 11/09/2015 Yes right lower extremity I89.0 Lymphedema, not elsewhere classified  11/09/2015 Yes Inactive Problems Resolved Problems Electronic Signature(s) Signed: 12/21/2015 5:31:30 PM By: Londell Moh FNP Entered By: Londell Moh on 12/21/2015 12:32:32 [name], [name] ([id number]) -------------------------------------------------------------------------------- Progress Note Details Patient Name: [name] Bishop. 12/21/2015 11:00 Date of Service: AM Medical Record [id number] Number: Patient Account Number: [id number] 11-29-1931 (82 y.o. Treating RN: Baruch Gouty, RN, BSN, Velva Harman Date of Birth/Sex: Female) Other Clinician: Primary Care Physician: [name] Treating Londell Moh Referring Physician: [name] Physician/Extender: Suella Grove in Treatment: 12 Subjective Chief Complaint Information obtained from Patient Patient presents to the wound care center for a consult due non healing wound to the right lower extremity which she's had for about 3 weeks now History of Present Illness (HPI) The following HPI elements were documented for the patient's wound: Location: lacerated wound to the  right lower extremity Quality: Patient reports experiencing a dull pain to affected area(s). Severity: Patient states wound (s) are getting better. Duration: Patient has had the wound for < 3 weeks prior to presenting for treatment Timing: Pain in wound is Intermittent (comes and goes Context: The wound occurred when the patient had a blunt injury which caused a laceration to the right lower extremity Modifying Factors: Other treatment(s) tried include:was seen in the ER on 09/15/2015 and sutured with 4-0 nylon sutures. Associated Signs and Symptoms: Patient reports having increase swelling. 82 year old patient who had an injury to her right lower extremity on July 8 while she was in [place] boarding a plane. She was treated at [facility] with sutures being placed on 09/15/2015. She was recently seen last week by her PCP edema of the feet and legs and after review as advised to take Lasix 20 mg daily. past hemoglobin A1c done in June 2017 was 9.1% Her past medical history is significant for diabetes mellitus, arthritis, basal cell carcinoma, collagenous colitis, essential hypertension, avitaminosis D. She is also status post abdominal hysterectomy, lumbar discectomy, neck surgery. She is a former smoker and quit in 1979. Of note in the recent past she was seen by Dr. Lucky Cowboy for what seems a sounds like injections of varicose veins. This was only around her ankles and not in the lower extremities. No Doppler studies were done. She was not advised to wear any compression stockings. 11/02/2015 -- was admitted to the hospital between 8/18 and 10/28/2015 for a syncopal attack and was found to have acute renal failure, UTI and her x-rays checked out okay. She was evaluated and discharged home on Keflex which she is completing today. 11/23/2015 -- recently discharged out of hospital after spending 2 days for a workup for seizures and unspecified syncope. MRI of the brain showed no acute abnormality and  EEG is still pending and carotid [name], [name] ([id number]) Dopplers did not show any abnormality. Neurology recommended no antiepileptics and there was no arrhythmia noted on telemetry, echo was normal and a Halter monitor was recommended as an outpatient. 11/30/2015 -- her co- payment for her skin substitute was over $350 per visit and she has declined this. 12/14/15: wound continues to improve. no fevers, chills, body aches or malaise. no interval changes regarding health status. 12/21/15: returns today for f/u. wound improving. no systemic s/s of infection. no interval changes regarding health status. she is hoping her wound is healed prior to vacation 01/09/16. Objective Constitutional Patient's appearance is neat and clean. Appears in no acute distress. Well nourished and well developed.. Vitals Time Taken: 12:13 PM, Height: 61 in, Weight: 170 lbs, BMI: 32.1, Temperature: 97.6 F, Pulse: 59 bpm, Respiratory  Rate: 19 breaths/min, Blood Pressure: 132/58 mmHg. Ears, Nose, Mouth, and Throat Patient can hear normal speaking tones without difficulty.. Cardiovascular Pedal pulses palpable and strong bilaterally.. Extremities are free of varicosities, clubbing or edema. Peripheral pulses strong and equal. Capillary refill < 3 seconds.. Psychiatric Judgement and insight intact.. Alert and oriented times 3.. Short and long term memory intact.. No evidence of depression, anxiety, or agitation. Calm, cooperative, and communicative. Appropriate interactions and affect.. Integumentary (Hair, Skin) Wound #3 status is Open. Original cause of wound was Trauma. The wound is located on the Right,Anterior Lower Leg. The wound measures 1cm length x 0.4cm width x 0.2cm depth; 0.314cm^2 area and 0.063cm^3 volume. The wound is limited to skin breakdown. There is no tunneling or undermining noted. There is a large amount of serosanguineous drainage noted. The wound margin is distinct with the outline  attached to the wound base. There is large (67-100%) red, pink granulation within the wound bed. There is no necrotic tissue within the wound bed. The periwound skin appearance exhibited: Moist. The periwound skin appearance did not exhibit: Callus, Crepitus, Excoriation, Fluctuance, Friable, Induration, Localized Edema, Rash, Scarring, Dry/Scaly, Maceration, Atrophie Blanche, Cyanosis, Ecchymosis, Hemosiderin Staining, Mottled, Pallor, Rubor, Erythema. Periwound temperature was noted as No Abnormality. The periwound has tenderness on palpation. [name], SCHLOTZHAUER ([id number]) Assessment Active Problems ICD-10 E11.622 - Type 2 diabetes mellitus with other skin ulcer S81.811A - Laceration without foreign body, right lower leg, initial encounter L97.212 - Non-pressure chronic ulcer of right calf with fat layer exposed I87.311 - Chronic venous hypertension (idiopathic) with ulcer of right lower extremity I89.0 - Lymphedema, not elsewhere classified Diagnoses ICD-10 E11.622: Type 2 diabetes mellitus with other skin ulcer S81.811A: Laceration without foreign body, right lower leg, initial encounter L97.212: Non-pressure chronic ulcer of right calf with fat layer exposed I87.311: Chronic venous hypertension (idiopathic) with ulcer of right lower extremity I89.0: Lymphedema, not elsewhere classified Plan Wound Cleansing: Wound #3 Right,Anterior Lower Leg: Clean wound with Normal Saline. Cleanse wound with mild soap and water May Shower, gently pat wound dry prior to applying new dressing. Anesthetic: Wound #3 Right,Anterior Lower Leg: Topical Lidocaine 4% cream applied to wound bed prior to debridement - for clinic use Primary Wound Dressing: Wound #3 Right,Anterior Lower Leg: Prisma Ag Secondary Dressing: Wound #3 Right,Anterior Lower Leg: Conform/Kerlix - conform netting and tape Non-adherent pad Dressing Change Frequency: Wound #3 Right,Anterior Lower Leg: Change dressing every  other day. Follow-up Appointments: Wound #3 Right,Anterior Lower Leg: Return Appointment in 1 week. Additional Orders / Instructions: HAILYN, TROMPETER R. ([id number]) Wound #3 Right,Anterior Lower Leg: Increase protein intake. Medications-please add to medication list.: Wound #3 Right,Anterior Lower Leg: Other: - Vitamin A, Vitamin C, Zinc, Multivitamin Follow-Up Appointments: A Patient Clinical Summary of Care was provided to [facility] 1. discussed clinical findings and implications with pt and daugther. all questions were answered. 2. counseling provided regarding patholophysiology of wound healing and goals for healing. Electronic Signature(s) Signed: 12/21/2015 5:31:30 PM By: Londell Moh FNP Entered By: Londell Moh on 12/21/2015 12:36:26 [place], [name] ([id number]) -------------------------------------------------------------------------------- SuperBill Details Patient Name: [name] Bishop Date of Service: 12/21/2015 Medical Record Patient Account Number: [id number] Number: Afful, RN, BSN, Treating RN: 1931-05-29 (82 y.o. Velva Harman Date of Birth/Sex: Female) Other Clinician: Primary Care Physician: [name] Treating Londell Moh Referring Physician: [name] Physician/Extender: Suella Grove in Treatment: 12 Diagnosis Coding ICD-10 Codes Code Description E11.622 Type 2 diabetes mellitus with other skin ulcer S81.811A Laceration without foreign body, right lower leg, initial encounter  [id number] Non-pressure chronic ulcer of right calf with fat layer exposed I87.311 Chronic venous hypertension (idiopathic) with ulcer of right lower extremity I89.0 Lymphedema, not elsewhere classified Facility Procedures CPT4 Code: [id number] Description: [phone] - WOUND CARE VISIT-LEV 2 EST PT Modifier: Quantity: 1 Physician Procedures CPT4 Code Description: [id number] - WC PHYS LEVEL 2 - EST PT ICD-10 Description Diagnosis S81.811A Laceration without foreign  body, right lower leg, initi E11.622 Type 2 diabetes mellitus with other skin ulcer L97.212 Non-pressure chronic ulcer of  right calf with fat layer I87.311 Chronic venous hypertension (idiopathic) with ulcer of Modifier: al encounter exposed right lower e Quantity: 1 xtremity Electronic Signature(s) Signed: 12/21/2015 5:31:30 PM By: Londell Moh FNP Entered By: Londell Moh on 12/21/2015 12:35:24

## 2015-12-22 NOTE — Progress Notes (Signed)
BRADEN, DELOACH ([id number]) Visit Report for 12/21/2015 Arrival Information Details Patient Name: [name], [name] Date of Service: 12/21/2015 11:00 AM Medical Record Number: [id number] Patient Account Number: [id number] Date of Birth/Sex: [birth year] (82 y.o. Female) Treating RN: Afful, RN, BSN, Velva Harman Primary Care Physician: [name] Other Clinician: Referring Physician: [name] Treating Physician/Extender: Loistine Chance in Treatment: 12 Visit Information History Since Last Visit All ordered tests and consults were completed: No Patient Arrived: Cane Any new allergies or adverse reactions: No Arrival Time: 12:10 Had a fall or experienced change in No Accompanied By: dtr activities of daily living that may affect Transfer Assistance: None risk of falls: Patient Identification Verified: Yes Signs or symptoms of abuse/neglect since last No Secondary Verification Process Yes visito Completed: Hospitalized since last visit: No Patient Requires Transmission- No Has Dressing in Place as Prescribed: Yes Based Precautions: Has Compression in Place as Prescribed: Yes Patient Has Alerts: Yes Pain Present Now: No Patient Alerts: Patient on Blood Thinner aspirin BMI 1.04 to rt lower leg Electronic Signature(s) Signed: 12/21/2015 4:35:15 PM By: Regan Lemming BSN, RN Entered By: Regan Lemming on 12/21/2015 12:12:43 [name] ([id number]) -------------------------------------------------------------------------------- Clinic Level of Care Assessment Details Patient Name: [name] Date of Service: 12/21/2015 11:00 AM Medical Record Number: [id number] Patient Account Number: [id number] Date of Birth/Sex: Nov 21, 1931 (82 y.o. Female) Treating RN: Afful, RN, BSN, Velva Harman Primary Care Physician: [name] Other Clinician: Referring Physician: [name] Treating Physician/Extender: Loistine Chance in Treatment: 12 Clinic Level  of Care Assessment Items TOOL 4 Quantity Score [] - Use when only an EandM is performed on FOLLOW-UP visit 0 ASSESSMENTS - Nursing Assessment / Reassessment X - Reassessment of Co-morbidities (includes updates in patient status) 1 10 X - Reassessment of Adherence to Treatment Plan 1 5 ASSESSMENTS - Wound and Skin Assessment / Reassessment X - Simple Wound Assessment / Reassessment - one wound 1 5 [] - Complex Wound Assessment / Reassessment - multiple wounds 0 [] - Dermatologic / Skin Assessment (not related to wound area) 0 ASSESSMENTS - Focused Assessment [] - Circumferential Edema Measurements - multi extremities 0 [] - Nutritional Assessment / Counseling / Intervention 0 X - Lower Extremity Assessment (monofilament, tuning fork, pulses) 1 5 [] - Peripheral Arterial Disease Assessment (using hand held doppler) 0 ASSESSMENTS - Ostomy and/or Continence Assessment and Care [] - Incontinence Assessment and Management 0 [] - Ostomy Care Assessment and Management (repouching, etc.) 0 PROCESS - Coordination of Care X - Simple Patient / Family Education for ongoing care 1 15 [] - Complex (extensive) Patient / Family Education for ongoing care 0 [] - Staff obtains Programmer, systems, Records, Test Results / Process Orders 0 [] - Staff telephones HHA, Nursing Homes / Clarify orders / etc 0 [] - Routine Transfer to another Facility (non-emergent condition) 0 [name], [name] ([id number]) [] - Routine Hospital Admission (non-emergent condition) 0 [] - New Admissions / Biomedical engineer / Ordering NPWT, Apligraf, etc. 0 [] - Emergency Hospital Admission (emergent condition) 0 [] - Simple Discharge Coordination 0 [] - Complex (extensive) Discharge Coordination 0 PROCESS - Special Needs [] - Pediatric / Minor Patient Management 0 [] - Isolation Patient Management 0 [] - Hearing / Language / Visual special needs 0 [] - Assessment of Community assistance (transportation, D/C planning, etc.) 0 [] -  Additional assistance / Altered mentation 0 [] - Support Surface(s) Assessment (bed, cushion, seat, etc.) 0 INTERVENTIONS - Wound Cleansing / Measurement X - Simple Wound Cleansing -  one wound 1 5 [] - Complex Wound Cleansing - multiple wounds 0 X - Wound Imaging (photographs - any number of wounds) 1 5 [] - Wound Tracing (instead of photographs) 0 [] - Simple Wound Measurement - one wound 0 [] - Complex Wound Measurement - multiple wounds 0 INTERVENTIONS - Wound Dressings X - Small Wound Dressing one or multiple wounds 1 10 [] - Medium Wound Dressing one or multiple wounds 0 [] - Large Wound Dressing one or multiple wounds 0 [] - Application of Medications - topical 0 [] - Application of Medications - injection 0 INTERVENTIONS - Miscellaneous [] - External ear exam 0 [name], [name] ([id number]) [] - Specimen Collection (cultures, biopsies, blood, body fluids, etc.) 0 [] - Specimen(s) / Culture(s) sent or taken to Lab for analysis 0 [] - Patient Transfer (multiple staff / Harrel Lemon Lift / Similar devices) 0 [] - Simple Staple / Suture removal (25 or less) 0 [] - Complex Staple / Suture removal (26 or more) 0 [] - Hypo / Hyperglycemic Management (close monitor of Blood Glucose) 0 [] - Ankle / Brachial Index (ABI) - do not check if billed separately 0 X - Vital Signs 1 5 Has the patient been seen at the hospital within the last three years: Yes Total Score: 65 Level Of Care: New/Established - Level 2 Electronic Signature(s) Signed: 12/21/2015 4:35:15 PM By: Regan Lemming BSN, RN Entered By: Regan Lemming on 12/21/2015 12:17:14 [name] ([id number]) -------------------------------------------------------------------------------- Encounter Discharge Information Details Patient Name: [name] Date of Service: 12/21/2015 11:00 AM Medical Record Number: [id number] Patient Account Number: [id number] Date of Birth/Sex: 02-Apr-1931 (82 y.o. Female) Treating RN: Baruch Gouty, RN, BSN,  Velva Harman Primary Care Physician: [name] Other Clinician: Referring Physician: [name] Treating Physician/Extender: Loistine Chance in Treatment: 12 Encounter Discharge Information Items Discharge Pain Level: 0 Discharge Condition: Stable Ambulatory Status: Ambulatory Discharge Destination: Home Transportation: Private Auto Accompanied By: dtr Schedule Follow-up Appointment: No Medication Reconciliation completed and provided to Patient/Care No : Provided on Clinical Summary of Care: 12/21/2015 Form Type Recipient Paper Patient [place] Signature(s) Signed: 12/21/2015 12:25:25 PM By: Ruthine Dose Entered By: Ruthine Dose on 12/21/2015 12:25:25 [name] ([id number]) -------------------------------------------------------------------------------- Lower Extremity Assessment Details Patient Name: [name] Date of Service: 12/21/2015 11:00 AM Medical Record Number: [id number] Patient Account Number: [id number] Date of Birth/Sex: 06/07/1931 (82 y.o. Female) Treating RN: Afful, RN, BSN, Velva Harman Primary Care Physician: [name] Other Clinician: Referring Physician: [name] Treating Physician/Extender: Loistine Chance in Treatment: 12 Vascular Assessment Pulses: Posterior Tibial Dorsalis Pedis Palpable: [Right:Yes] Extremity colors, hair growth, and conditions: Extremity Color: [Right:Mottled] Hair Growth on Extremity: [Right:No] Temperature of Extremity: [Right:Warm] Capillary Refill: [Right:< 3 seconds] Toe Nail Assessment Left: Right: Thick: Yes Discolored: Yes Deformed: No Improper Length and Hygiene: No Electronic Signature(s) Signed: 12/21/2015 4:35:15 PM By: Regan Lemming BSN, RN Entered By: Regan Lemming on 12/21/2015 12:12:58 [name], [name] ([id number]) -------------------------------------------------------------------------------- Multi Wound Chart Details Patient Name: [name] Date of Service: 12/21/2015 11:00 AM Medical Record Number: [id number] Patient Account Number: [id number] Date of Birth/Sex: [birth year] (82 y.o. 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] Secondary Etiology: [3:Venous Leg Ulcer] [N/A:N/A] Comorbid History: [3:Cataracts, Hypertension, N/A Colitis, Type II Diabetes, Osteoarthritis] Date Acquired: [3:09/15/2015] [N/A:N/A] Weeks of Treatment: [3:12] [N/A:N/A] Wound Status: [3:Open] [N/A:N/A] Measurements L x W x D 1x0.4x0.2 [N/A:N/A] (cm) Area (cm) : [3:0.314] [N/A:N/A] Volume (cm) : [3:0.063] [N/A:N/A] % Reduction in Area: [3:99.40%] [N/A:N/A] % Reduction in Volume: 98.90% [N/A:N/A] Classification: [3:Grade 1] [N/A:N/A] Exudate Amount: [3:Large] [N/A:N/A] Exudate Type: [3:Serosanguineous] [N/A:N/A] Exudate Color: [3:red, brown] [N/A:N/A] Foul Odor After [3:Yes] [N/A:N/A] Cleansing: Odor Anticipated Due to No [N/A:N/A] Product Use: Wound Margin: [3:Distinct, outline attached N/A] Granulation Amount: [3:Large (67-100%)] [N/A:N/A] Granulation Quality: [3:Red, Pink] [N/A:N/A] Necrotic Amount: [3:None Present (0%)] [N/A:N/A] Exposed Structures: Fascia: No N/A N/A Fat: No Tendon: No Muscle: No Joint: No Bone: No Limited to Skin Breakdown Epithelialization: Medium (34-66%) N/A N/A Periwound Skin Texture: Edema: No N/A N/A Excoriation: No Induration: No Callus: No Crepitus: No Fluctuance:  No Friable: No Rash: No Scarring: No Periwound Skin Moist: Yes N/A N/A Moisture: Maceration: No Dry/Scaly: No Periwound Skin Color: Atrophie Blanche: No N/A N/A Cyanosis: No Ecchymosis: No Erythema: No Hemosiderin Staining: No Mottled: No Pallor: No Rubor: No Temperature: No Abnormality N/A N/A Tenderness on Yes N/A N/A Palpation: Wound Preparation: Ulcer Cleansing: N/A N/A Rinsed/Irrigated with Saline Topical Anesthetic Applied: Other: lidocaine 4% Treatment Notes Electronic Signature(s) Signed: 12/21/2015 4:35:15 PM By: Regan Lemming BSN, RN Entered By: Regan Lemming on 12/21/2015 12:16:17 [name], [name] ([id number]) -------------------------------------------------------------------------------- Multi-Disciplinary Care Plan Details Patient Name: [name] Date of Service: 12/21/2015 11:00 AM Medical Record Number: [id number] Patient Account Number: [id number] Date of Birth/Sex: Aug 07, 1931 (82 y.o. Female) Treating RN: Afful, RN, BSN, Velva Harman Primary Care Physician: [name] Other Clinician: Referring Physician: [name] Treating Physician/Extender: Loistine Chance in Treatment: 12 Active Inactive Abuse / Safety / Falls / Self Care Management Nursing Diagnoses: Potential for falls Goals: Patient/caregiver will verbalize understanding of skin care regimen Date Initiated: 09/28/2015 Goal Status: Active Patient/caregiver will verbalize/demonstrate measures taken to prevent injury and/or falls Date Initiated: 09/28/2015 Goal Status: Active Interventions: Assess fall risk on admission and as needed Assess: immobility, friction, shearing, incontinence upon admission and as needed Assess impairment of mobility on admission and as needed per policy Assess self care needs on admission and as needed Provide education on fall prevention Notes: Orientation to the Wound Care Program Nursing Diagnoses: Knowledge deficit related to the wound  healing center program Goals: Patient/caregiver will verbalize understanding of the [place] Program Date Initiated: 09/28/2015 Goal Status: Active Interventions: Provide education on orientation to the wound center Notes: [name], [name] ([id number]) Pain, Acute or Chronic Nursing Diagnoses: Potential alteration in comfort, pain Goals: Patient will verbalize adequate pain control and receive pain control interventions during procedures as needed Date Initiated: 09/28/2015 Goal Status: Active Patient/caregiver will verbalize adequate pain control between visits Date Initiated: 09/28/2015 Goal Status: Active Patient/caregiver will verbalize comfort level met Date Initiated: 09/28/2015 Goal Status: Active Interventions: Assess comfort goal upon admission Complete pain assessment as per visit requirements Encourage patient to take pain medications as prescribed Notes: Wound/Skin Impairment Nursing Diagnoses: Impaired tissue integrity Goals: Ulcer/skin breakdown will heal within 14 weeks Date Initiated: 09/28/2015 Goal Status: Active Interventions: Assess patient/caregiver ability to obtain necessary supplies Assess patient/caregiver ability to perform ulcer/skin care regimen upon admission and as needed Provide education on ulcer and skin care Notes: Electronic Signature(s) Signed: 12/21/2015 4:35:15 PM By: Regan Lemming BSN, RN Entered By: Regan Lemming on 12/21/2015  12:15:39 [name], [name] ([id number]) -------------------------------------------------------------------------------- Pain Assessment Details Patient Name: [name], [name] Date of Service: 12/21/2015 11:00 AM Medical Record Number: [id number] Patient Account Number: [id number] Date of Birth/Sex: 05-Mar-1932 (82 y.o. Female) Treating RN: Baruch Gouty, RN, BSN, Velva Harman Primary Care Physician: [name] Other Clinician: Referring Physician: [name] Treating Physician/Extender: Loistine Chance in Treatment: 12 Active Problems Location of Pain Severity and Description of Pain Patient Has Paino No Site Locations With Dressing Change: No Pain Management and Medication Current Pain Management: Electronic Signature(s) Signed: 12/21/2015 4:35:15 PM By: Regan Lemming BSN, RN Entered By: Regan Lemming on 12/21/2015 12:12:19 [name] ([id number]) -------------------------------------------------------------------------------- Patient/Caregiver Education Details Patient Name: [name] Date of Service: 12/21/2015 11:00 AM Medical Record Number: [id number] Patient Account Number: [id number] Date of Birth/Gender: Sep 15, 1931 (82 y.o. Female) Treating RN: Baruch Gouty, RN, BSN, Velva Harman Primary Care Physician: [name] Other Clinician: Referring Physician: [name] Treating Physician/Extender: Loistine Chance in Treatment: 12 Education Assessment Education Provided To: Patient and Caregiver Education Topics Provided Safety: Methods: Explain/Verbal Responses: State content correctly Welcome To [facility]: Methods: Explain/Verbal Responses: State content correctly Wound/Skin Impairment: Methods: Explain/Verbal Responses: State content correctly Electronic Signature(s) Signed: 12/21/2015 4:35:15 PM By: Regan Lemming BSN, RN Entered By: Regan Lemming on 12/21/2015 12:23:21 [name] ([id number]) -------------------------------------------------------------------------------- Wound Assessment Details Patient Name: [name] Date of Service: 12/21/2015 11:00 AM Medical Record Number: [id number] Patient Account Number: [id number] Date of Birth/Sex: 03/01/1932 (82 y.o. Female) Treating RN: Afful, RN, BSN, [place] Primary Care Physician: [name] Other Clinician: Referring Physician: [name] Treating Physician/Extender: Loistine Chance in Treatment: 12 Wound Status Wound Number: 3 Primary  Diabetic Wound/Ulcer of the Lower Etiology: Extremity Wound Location: Right Lower Leg - Anterior Secondary Venous Leg Ulcer Wounding Event: Trauma Etiology: Date Acquired: 09/15/2015 Wound Status: Open Weeks Of Treatment: 12 Comorbid Cataracts, Hypertension, Colitis, Clustered Wound: No History: Type II Diabetes, Osteoarthritis Wound Measurements Length: (cm) 1 Width: (cm) 0.4 Depth: (cm) 0.2 Area: (cm) 0.314 Volume: (cm) 0.063 % Reduction in Area: 99.4% % Reduction in Volume: 98.9% Epithelialization: Medium (34-66%) Tunneling: No Undermining: No Wound Description Classification: Grade 1 Wound Margin: Distinct, outline attached Exudate Amount: Large Exudate Type: Serosanguineous Exudate Color: red, brown Foul Odor After Cleansing: Yes Due to Product Use: No Wound Bed Granulation Amount: Large (67-100%) Exposed Structure Granulation Quality: Red, Pink Fascia Exposed: No Necrotic Amount: None Present (0%) Fat Layer Exposed: No Tendon Exposed: No Muscle Exposed: No Joint Exposed: No Bone Exposed: No Limited to Skin Breakdown Periwound Skin Texture Texture Color No Abnormalities Noted: No No Abnormalities Noted: No Callus: No Atrophie Blanche: No Crepitus: No Cyanosis: No Excoriation: No Ecchymosis: No Lengyel, [name] ([id number]) Fluctuance: No Erythema: No Friable: No Hemosiderin Staining: No Induration: No Mottled: No Localized Edema: No Pallor: No Rash: No Rubor: No Scarring: No Temperature / Pain Moisture Temperature: No Abnormality No Abnormalities Noted: No Tenderness on Palpation: Yes Dry / Scaly: No Maceration: No Moist: Yes Wound Preparation Ulcer Cleansing: Rinsed/Irrigated with Saline Topical Anesthetic Applied: Other: lidocaine 4%, Treatment Notes Wound #3 (Right, Anterior Lower Leg) 1. Cleansed with: Clean wound with Normal Saline 4. Dressing Applied: Prisma Ag 5. Secondary Dressing Applied Bordered Foam  Dressing Notes netting Electronic Signature(s) Signed: 12/21/2015 4:35:15 PM By: Regan Lemming BSN, RN Entered By: Regan Lemming on 12/21/2015 12:15:21 [name], [name] ([id number]) -------------------------------------------------------------------------------- Vitals Details Patient Name: [name] Date of Service: 12/21/2015 11:00 AM Medical Record Number: [id number] Patient Account Number: [id number]  Date of Birth/Sex: Nov 06, 1931 (82 y.o. Female) Treating RN: Afful, RN, BSN, [place] Primary Care Physician: [name] Other Clinician: Referring Physician: [name] Treating Physician/Extender: Loistine Chance in Treatment: 12 Vital Signs Time Taken: 12:13 Temperature (F): 97.6 Height (in): 61 Pulse (bpm): 59 Weight (lbs): 170 Respiratory Rate (breaths/min): 19 Body Mass Index (BMI): 32.1 Blood Pressure (mmHg): 132/58 Reference Range: 80 - 120 mg / dl Electronic Signature(s) Signed: 12/21/2015 4:35:15 PM By: Regan Lemming BSN, RN Entered By: Regan Lemming on 12/21/2015 12:13:17

## 2015-12-28 ENCOUNTER — Encounter: Payer: Medicare Other | Admitting: Surgery

## 2015-12-28 ENCOUNTER — Other Ambulatory Visit: Payer: Self-pay | Admitting: Family Medicine

## 2015-12-28 DIAGNOSIS — E11622 Type 2 diabetes mellitus with other skin ulcer: Secondary | ICD-10-CM | POA: Diagnosis not present

## 2015-12-28 DIAGNOSIS — R609 Edema, unspecified: Secondary | ICD-10-CM

## 2015-12-29 NOTE — Progress Notes (Signed)
[name], [name] ([id number]) Visit Report for 12/28/2015 Arrival Information Details Patient Name: [name], [name] 12/28/2015 11:00 Date of Service: AM Medical Record [id number] Number: Patient Account Number: [id number] 21-Jan-1932 (82 y.o. Treating RN: Ahmed Prima Date of Birth/Sex: Female) Other Clinician: Primary Care Physician: [name] Treating Britto, Errol Referring Physician: [name] Physician/Extender: Weeks in Treatment: 13 Visit Information History Since Last Visit All ordered tests and consults were completed: No Patient Arrived: Ambulatory Added or deleted any medications: No Arrival Time: 11:27 Any new allergies or adverse reactions: No Accompanied By: daughter Had a fall or experienced change in No Transfer Assistance: None activities of daily living that may affect Patient Identification Verified: Yes risk of falls: Secondary Verification Process Yes Signs or symptoms of abuse/neglect since last No Completed: visito Patient Requires Transmission- No Hospitalized since last visit: No Based Precautions: Pain Present Now: No Patient Has Alerts: Yes Patient Alerts: Patient on Blood Thinner aspirin BMI 1.04 to rt lower leg Electronic Signature(s) Signed: 12/28/2015 4:59:27 PM By: Alric Quan Entered By: Alric Quan on 12/28/2015 11:28:53 Knoop, Clemens Catholic ([id number]) -------------------------------------------------------------------------------- Clinic Level of Care Assessment Details Patient Name: [name]. 12/28/2015 11:00 Date of Service: AM Medical Record [id number] Number: Patient Account Number: [id number] 11-Jun-1931 (82 y.o. Treating RN: Montey Hora Date of Birth/Sex: Female) Other Clinician: Primary Care Physician: [name] Treating Britto, Errol Referring Physician: [name] Physician/Extender: Weeks in Treatment: 13 Clinic Level of Care Assessment Items TOOL 4 Quantity  Score []  - Use when only an EandM is performed on FOLLOW-UP visit 0 ASSESSMENTS - Nursing Assessment / Reassessment X - Reassessment of Co-morbidities (includes updates in patient status) 1 10 X - Reassessment of Adherence to Treatment Plan 1 5 ASSESSMENTS - Wound and Skin Assessment / Reassessment X - Simple Wound Assessment / Reassessment - one wound 1 5 []  - Complex Wound Assessment / Reassessment - multiple wounds 0 []  - Dermatologic / Skin Assessment (not related to wound area) 0 ASSESSMENTS - Focused Assessment []  - Circumferential Edema Measurements - multi extremities 0 []  - Nutritional Assessment / Counseling / Intervention 0 X - Lower Extremity Assessment (monofilament, tuning fork, pulses) 1 5 []  - Peripheral Arterial Disease Assessment (using hand held doppler) 0 ASSESSMENTS - Ostomy and/or Continence Assessment and Care []  - Incontinence Assessment and Management 0 []  - Ostomy Care Assessment and Management (repouching, etc.) 0 PROCESS - Coordination of Care X - Simple Patient / Family Education for ongoing care 1 15 []  - Complex (extensive) Patient / Family Education for ongoing care 0 []  - Staff obtains Programmer, systems, Records, Test Results / Process Orders 0 []  - Staff telephones HHA, Nursing Homes / Clarify orders / etc 0 Privett, Ed R. ([id number]) []  - Routine Transfer to another Facility (non-emergent condition) 0 []  - Routine Hospital Admission (non-emergent condition) 0 []  - New Admissions / Biomedical engineer / Ordering NPWT, Apligraf, etc. 0 []  - Emergency Hospital Admission (emergent condition) 0 X - Simple Discharge Coordination 1 10 []  - Complex (extensive) Discharge Coordination 0 PROCESS - Special Needs []  - Pediatric / Minor Patient Management 0 []  - Isolation Patient Management 0 []  - Hearing / Language / Visual special needs 0 []  - Assessment of Community assistance (transportation, D/C planning, etc.) 0 []  - Additional assistance / Altered  mentation 0 []  - Support Surface(s) Assessment (bed, cushion, seat, etc.) 0 INTERVENTIONS - Wound Cleansing / Measurement X - Simple Wound Cleansing - one wound 1 5 []  - Complex Wound Cleansing - multiple wounds  0 X - Wound Imaging (photographs - any number of wounds) 1 5 []  - Wound Tracing (instead of photographs) 0 X - Simple Wound Measurement - one wound 1 5 []  - Complex Wound Measurement - multiple wounds 0 INTERVENTIONS - Wound Dressings []  - Small Wound Dressing one or multiple wounds 0 []  - Medium Wound Dressing one or multiple wounds 0 []  - Large Wound Dressing one or multiple wounds 0 []  - Application of Medications - topical 0 []  - Application of Medications - injection 0 Degen, Irja R. ([id number]) INTERVENTIONS - Miscellaneous []  - External ear exam 0 []  - Specimen Collection (cultures, biopsies, blood, body fluids, etc.) 0 []  - Specimen(s) / Culture(s) sent or taken to Lab for analysis 0 []  - Patient Transfer (multiple staff / Harrel Lemon Lift / Similar devices) 0 []  - Simple Staple / Suture removal (25 or less) 0 []  - Complex Staple / Suture removal (26 or more) 0 []  - Hypo / Hyperglycemic Management (close monitor of Blood Glucose) 0 []  - Ankle / Brachial Index (ABI) - do not check if billed separately 0 X - Vital Signs 1 5 Has the patient been seen at the hospital within the last three years: Yes Total Score: 70 Level Of Care: New/Established - Level 2 Electronic Signature(s) Signed: 12/28/2015 4:59:11 PM By: Montey Hora Entered By: Montey Hora on 12/28/2015 11:44:30 Allbaugh, Clemens Catholic ([id number]) -------------------------------------------------------------------------------- Encounter Discharge Information Details Patient Name: [name] Austria R. 12/28/2015 11:00 Date of Service: AM Medical Record [id number] Number: Patient Account Number: [id number] [birth year] (82 y.o. Treating RN: Montey Hora Date of Birth/Sex: Female) Other Clinician: Primary Care  Physician: [name] Treating Britto, Errol Referring Physician: [name] Physician/Extender: Weeks in Treatment: 13 Encounter Discharge Information Items Discharge Pain Level: 0 Discharge Condition: Stable Ambulatory Status: Ambulatory Discharge Destination: Home Transportation: Private Auto Accompanied By: dtr Schedule Follow-up Appointment: No Medication Reconciliation completed and provided to Patient/Care No Kamrie Fanton: Provided on Clinical Summary of Care: 12/28/2015 Form Type Recipient Paper Patient [place] Signature(s) Signed: 12/28/2015 11:57:06 AM By: Ruthine Dose Entered By: Ruthine Dose on 12/28/2015 11:57:06 [name] ([id number]) -------------------------------------------------------------------------------- Lower Extremity Assessment Details Patient Name: [name] Austria R. 12/28/2015 11:00 Date of Service: AM Medical Record [id number] Number: Patient Account Number: [id number] 08-11-1931 (82 y.o. Treating RN: Ahmed Prima Date of Birth/Sex: Female) Other Clinician: Primary Care Physician: [name] Treating Britto, Errol Referring Physician: [name] Physician/Extender: Weeks in Treatment: 13 Vascular Assessment Pulses: Posterior Tibial Dorsalis Pedis Palpable: [Right:Yes] Extremity colors, hair growth, and conditions: Extremity Color: [Right:Mottled] Temperature of Extremity: [Right:Warm] Capillary Refill: [Right:< 3 seconds] Toe Nail Assessment Left: Right: Thick: Yes Discolored: Yes Deformed: No Improper Length and Hygiene: No Electronic Signature(s) Signed: 12/28/2015 4:59:27 PM By: Alric Quan Entered By: Alric Quan on 12/28/2015 11:30:08 [name] ([id number]) -------------------------------------------------------------------------------- [place] Details Patient Name: [name] Austria R. 12/28/2015 11:00 Date of Service: AM Medical  Record [id number] Number: Patient Account Number: [id number] [birth year] (82 y.o. Treating RN: Montey Hora Date of Birth/Sex: Female) Other Clinician: Primary Care Physician: [name] Treating Christin Fudge Referring Physician: [name] Physician/Extender: Weeks in Treatment: 13 Active Inactive Electronic Signature(s) Signed: 12/28/2015 4:59:11 PM By: Montey Hora Entered By: Montey Hora on 12/28/2015 11:43:32 Isais, Clemens Catholic ([id number]) -------------------------------------------------------------------------------- Pain Assessment Details Patient Name: ARLETHE, GREENHILL 12/28/2015 11:00 Date of Service: AM Medical Record [id number] Number: Patient Account Number: [id number] 11-08-1931 (82 y.o. Treating RN: Ahmed Prima Date of Birth/Sex: Female) Other Clinician: Primary Care Physician: [name] Treating Britto,  Errol Referring Physician: [name] Physician/Extender: Suella [place] in Treatment: 13 Active Problems Location of Pain Severity and Description of Pain Patient Has Paino No Site Locations With Dressing Change: No Pain Management and Medication Current Pain Management: Electronic Signature(s) Signed: 12/28/2015 4:59:27 PM By: Alric Quan Entered By: Alric Quan on 12/28/2015 11:29:20 [name] ([id number]) -------------------------------------------------------------------------------- Patient/Caregiver Education Details Patient Name: [name] Austria R. 12/28/2015 11:00 Date of Service: AM Medical Record [id number] Number: Patient Account Number: [id number] Sep 05, 1931 (82 y.o. Treating RN: Montey Hora Date of Birth/Gender: Female) Other Clinician: Primary Care Physician: [name] Treating Britto, Errol Referring Physician: [name] Physician/Extender: Suella [place] in Treatment: 13 Education Assessment Education Provided To: Patient and Caregiver Education Topics Provided Basic  Hygiene: Handouts: Other: protect newly healed wound site Methods: Explain/Verbal Responses: State content correctly Electronic Signature(s) Signed: 12/28/2015 4:59:11 PM By: Montey Hora Entered By: Montey Hora on 12/28/2015 11:45:53 Hogue, Clemens Catholic ([id number]) -------------------------------------------------------------------------------- Wound Assessment Details Patient Name: [name] Austria R. 12/28/2015 11:00 Date of Service: AM Medical Record [id number] Number: Patient Account Number: [id number] 08-06-1931 (82 y.o. Treating RN: Ahmed Prima Date of Birth/Sex: Female) Other Clinician: Primary Care Physician: [name] Treating Britto, Errol Referring Physician: [name] Physician/Extender: Weeks in Treatment: 13 Wound Status Wound Number: 3 Primary Diabetic Wound/Ulcer of the Lower Etiology: Extremity Wound Location: Right Lower Leg - Anterior Secondary Venous Leg Ulcer Wounding Event: Trauma Etiology: Date Acquired: 09/15/2015 Wound Status: Open Weeks Of Treatment: 13 Comorbid Cataracts, Hypertension, Colitis, Clustered Wound: No History: Type II Diabetes, Osteoarthritis Photos Photo Uploaded By: Alric Quan on 12/28/2015 12:06:46 Wound Measurements Length: (cm) Width: (cm) Depth: (cm) Area: (cm) Volume: (cm) 0 % Reduction in Area: 100% 0 % Reduction in Volume: 100% 0 Epithelialization: Large (67-100%) 0 Tunneling: No 0 Undermining: No Wound Description Classification: Grade 1 Wound Margin: Distinct, outline attached Exudate Amount: None Present Foul Odor After Cleansing: No Wound Bed Granulation Amount: None Present (0%) Exposed Structure Necrotic Amount: None Present (0%) Fascia Exposed: No Fat Layer Exposed: No Tendon Exposed: No Mckinny, Lissa R. ([id number]) Muscle Exposed: No Joint Exposed: No Bone Exposed: No Limited to Skin Breakdown Periwound Skin Texture Texture Color No Abnormalities Noted: No No  Abnormalities Noted: No Callus: No Atrophie Blanche: No Crepitus: No Cyanosis: No Excoriation: No Ecchymosis: No Fluctuance: No Erythema: No Friable: No Hemosiderin Staining: No Induration: No Mottled: No Localized Edema: No Pallor: No Rash: No Rubor: No Scarring: No Temperature / Pain Moisture Temperature: No Abnormality No Abnormalities Noted: No Tenderness on Palpation: Yes Dry / Scaly: No Maceration: No Moist: No Wound Preparation Ulcer Cleansing: Rinsed/Irrigated with Saline Topical Anesthetic Applied: None Electronic Signature(s) Signed: 12/28/2015 4:59:27 PM By: Alric Quan Entered By: Alric Quan on 12/28/2015 11:36:49 Switzer, Clemens Catholic ([id number]) -------------------------------------------------------------------------------- Vitals Details Patient Name: [name] Austria R. 12/28/2015 11:00 Date of Service: AM Medical Record [id number] Number: Patient Account Number: [id number] [birth year] (83 y.o. Treating RN: Ahmed Prima Date of Birth/Sex: Female) Other Clinician: Primary Care Physician: [name] Treating Britto, Errol Referring Physician: [name] Physician/Extender: Weeks in Treatment: 13 Vital Signs Time Taken: 11:29 Temperature (F): 97.5 Height (in): 61 Pulse (bpm): 60 Weight (lbs): 170 Respiratory Rate (breaths/min): 18 Body Mass Index (BMI): 32.1 Blood Pressure (mmHg): 148/73 Reference Range: 80 - 120 mg / dl Electronic Signature(s) Signed: 12/28/2015 4:59:27 PM By: Alric Quan Entered By: Alric Quan on 12/28/2015 11:29:40

## 2015-12-29 NOTE — Progress Notes (Signed)
[name], [name] ([id number]) Visit Report for 12/28/2015 Chief Complaint Document Details Patient Name: [name], [name] 12/28/2015 11:00 Date of Service: AM Medical Record [id number] Number: Patient Account Number: [id number] 1932/02/05 (82 y.o. Treating RN: Date of Birth/Sex: Female) Other Clinician: Primary Care Physician: Natale Milch, DENNIS Treating [name] Referring Physician: [name] Physician/Extender: Weeks in Treatment: 13 Information Obtained from: Patient Chief Complaint Patient presents to the wound care center for a consult due non healing wound to the right lower extremity which she's had for about 3 weeks now Electronic Signature(s) Signed: 12/28/2015 11:58:25 AM By: Christin Fudge MD, FACS Entered By: Christin Fudge on 12/28/2015 11:58:25 [name] ([id number]) -------------------------------------------------------------------------------- HPI Details Patient Name: [name] Austria R. 12/28/2015 11:00 Date of Service: AM Medical Record [id number] Number: Patient Account Number: [id number] Jun 07, 1931 (82 y.o. Treating RN: Date of Birth/Sex: Female) Other Clinician: Primary Care Physician: Natale Milch, DENNIS Treating [name] Referring Physician: [name] Physician/Extender: Weeks in Treatment: 13 History of Present Illness Location: lacerated wound to the right lower extremity Quality: Patient reports experiencing a dull pain to affected area(s). Severity: Patient states wound (s) are getting better. Duration: Patient has had the wound for < 3 weeks prior to presenting for treatment Timing: Pain in wound is Intermittent (comes and goes Context: The wound occurred when the patient had a blunt injury which caused a laceration to the right lower extremity Modifying Factors: Other treatment(s) tried include:was seen in the ER on 09/15/2015 and sutured with 4-0 nylon sutures. Associated Signs and Symptoms: Patient reports  having increase swelling. HPI Description: 82 year old patient who had an injury to her right lower extremity on July 8 while she was in [place] boarding a plane. She was treated at [facility] with sutures being placed on 09/15/2015. She was recently seen last week by her PCP edema of the feet and legs and after review as advised to take Lasix 20 mg daily. past hemoglobin A1c done in June 2017 was 9.1% Her past medical history is significant for diabetes mellitus, arthritis, basal cell carcinoma, collagenous colitis, essential hypertension, avitaminosis D. She is also status post abdominal hysterectomy, lumbar discectomy, neck surgery. She is a former smoker and quit in 1979. Of note in the recent past she was seen by Dr. Lucky Cowboy for what seems a sounds like injections of varicose veins. This was only around her ankles and not in the lower extremities. No Doppler studies were done. She was not advised to wear any compression stockings. 11/02/2015 -- was admitted to the hospital between 8/18 and 10/28/2015 for a syncopal attack and was found to have acute renal failure, UTI and her x-rays checked out okay. She was evaluated and discharged home on Keflex which she is completing today. 11/23/2015 -- recently discharged out of hospital after spending 2 days for a workup for seizures and unspecified syncope. MRI of the brain showed no acute abnormality and EEG is still pending and carotid Dopplers did not show any abnormality. Neurology recommended no antiepileptics and there was no arrhythmia noted on telemetry, echo was normal and a Halter monitor was recommended as an outpatient. 11/30/2015 -- her co- payment for her skin substitute was over $350 per visit and she has declined this. 12/14/15: wound continues to improve. no fevers, chills, body aches or malaise. no interval changes regarding health status. 12/21/15: returns today for f/u. wound improving. no systemic s/s of infection. no interval changes  regarding health status. she is hoping her wound is healed prior to vacation 01/09/16. [place], [name] (  [id number]) Electronic Signature(s) Signed: 12/28/2015 11:58:38 AM By: Christin Fudge MD, FACS Entered By: Christin Fudge on 12/28/2015 11:58:38 [name], [name] ([id number]) -------------------------------------------------------------------------------- Physical Exam Details Patient Name: [name], [name] 12/28/2015 11:00 Date of Service: AM Medical Record [id number] Number: Patient Account Number: [id number] [birth year] (82 y.o. Treating RN: Date of Birth/Sex: Female) Other Clinician: Primary Care Physician: Natale Milch, DENNIS Treating [name] Referring Physician: [name] Physician/Extender: Weeks in Treatment: 13 Constitutional . Pulse regular. Respirations normal and unlabored. Afebrile. . Eyes Nonicteric. Reactive to light. Ears, Nose, Mouth, and Throat Lips, teeth, and gums WNL.Marland Kitchen Moist mucosa without lesions. Neck supple and nontender. No palpable supraclavicular or cervical adenopathy. Normal sized without goiter. Respiratory WNL. No retractions.. Breath sounds WNL, No rubs, rales, rhonchi, or wheeze.. Cardiovascular Heart rhythm and rate regular, no murmur or gallop.. Pedal Pulses WNL. No clubbing, cyanosis or edema. Lymphatic No adneopathy. No adenopathy. No adenopathy. Musculoskeletal Adexa without tenderness or enlargement.. Digits and nails w/o clubbing, cyanosis, infection, petechiae, ischemia, or inflammatory conditions.. Integumentary (Hair, Skin) No suspicious lesions. No crepitus or fluctuance. No peri-wound warmth or erythema. No masses.Marland Kitchen Psychiatric Judgement and insight Intact.. No evidence of depression, anxiety, or agitation.. Notes the wound on the right lower extremity is completely healed Electronic Signature(s) Signed: 12/28/2015 11:58:59 AM By: Christin Fudge MD, FACS Entered By: Christin Fudge on 12/28/2015 11:58:58 [name], [name] ([id number]) -------------------------------------------------------------------------------- Physician Orders Details Patient Name: [name]. 12/28/2015 11:00 Date of Service: AM Medical Record [id number] Number: Patient Account Number: [id number] [birth year] (82 y.o. Treating RN: Montey Hora Date of Birth/Sex: Female) Other Clinician: Primary Care Physician: [name] Treating [place] Pelto Referring Physician: [name] Physician/Extender: Suella [place] in Treatment: 13 Verbal / Phone Orders: Yes Clinician: Montey Hora Read Back and Verified: Yes Diagnosis Coding Discharge From [facility] o Discharge from [place] - protect newly healed wound site Electronic Signature(s) Signed: 12/28/2015 4:40:13 PM By: Christin Fudge MD, FACS Signed: 12/28/2015 4:59:11 PM By: Montey Hora Entered By: Montey Hora on 12/28/2015 11:44:01 Archey, Clemens Catholic ([id number]) -------------------------------------------------------------------------------- Problem List Details Patient Name: BAHAR, TURTURRO 12/28/2015 11:00 Date of Service: AM Medical Record [id number] Number: Patient Account Number: [id number] Nov 03, 1931 (82 y.o. Treating RN: Date of Birth/Sex: Female) Other Clinician: Primary Care Physician: [name] Treating Thera Basden Referring Physician: [name] Physician/Extender: Weeks in Treatment: 13 Active Problems ICD-10 Encounter Code Description Active Date Diagnosis E11.622 Type 2 diabetes mellitus with other skin ulcer 11/09/2015 Yes S81.811A Laceration without foreign body, right lower leg, initial 11/09/2015 Yes encounter L97.212 Non-pressure chronic ulcer of right calf with fat layer 11/09/2015 Yes exposed I87.311 Chronic venous hypertension (idiopathic) with ulcer of 11/09/2015 Yes right lower extremity I89.0 Lymphedema, not elsewhere classified 11/09/2015 Yes Inactive Problems Resolved Problems Electronic  Signature(s) Signed: 12/28/2015 11:58:18 AM By: Christin Fudge MD, FACS Entered By: Christin Fudge on 12/28/2015 11:58:17 [name] ([id number]) -------------------------------------------------------------------------------- Progress Note Details Patient Name: [name] Austria R. 12/28/2015 11:00 Date of Service: AM Medical Record [id number] Number: Patient Account Number: [id number] 1932-02-07 (82 y.o. Treating RN: Date of Birth/Sex: Female) Other Clinician: Primary Care Physician: Natale Milch, DENNIS Treating Gabriela Irigoyen Referring Physician: [name] Physician/Extender: Weeks in Treatment: 13 Subjective Chief Complaint Information obtained from Patient Patient presents to the wound care center for a consult due non healing wound to the right lower extremity which she's had for about 3 weeks now History of Present Illness (HPI) The following HPI elements were documented for the patient's wound: Location: lacerated wound to the right lower extremity  Quality: Patient reports experiencing a dull pain to affected area(s). Severity: Patient states wound (s) are getting better. Duration: Patient has had the wound for < 3 weeks prior to presenting for treatment Timing: Pain in wound is Intermittent (comes and goes Context: The wound occurred when the patient had a blunt injury which caused a laceration to the right lower extremity Modifying Factors: Other treatment(s) tried include:was seen in the ER on 09/15/2015 and sutured with 4-0 nylon sutures. Associated Signs and Symptoms: Patient reports having increase swelling. 82 year old patient who had an injury to her right lower extremity on July 8 while she was in [place] boarding a plane. She was treated at [facility] with sutures being placed on 09/15/2015. She was recently seen last week by her PCP edema of the feet and legs and after review as advised to take Lasix 20 mg daily. past hemoglobin A1c done in June 2017 was  9.1% Her past medical history is significant for diabetes mellitus, arthritis, basal cell carcinoma, collagenous colitis, essential hypertension, avitaminosis D. She is also status post abdominal hysterectomy, lumbar discectomy, neck surgery. She is a former smoker and quit in 1979. Of note in the recent past she was seen by Dr. Lucky Cowboy for what seems a sounds like injections of varicose veins. This was only around her ankles and not in the lower extremities. No Doppler studies were done. She was not advised to wear any compression stockings. 11/02/2015 -- was admitted to the hospital between 8/18 and 10/28/2015 for a syncopal attack and was found to have acute renal failure, UTI and her x-rays checked out okay. She was evaluated and discharged home on Keflex which she is completing today. 11/23/2015 -- recently discharged out of hospital after spending 2 days for a workup for seizures and unspecified syncope. MRI of the brain showed no acute abnormality and EEG is still pending and carotid [name], [name] ([id number]) Dopplers did not show any abnormality. Neurology recommended no antiepileptics and there was no arrhythmia noted on telemetry, echo was normal and a Halter monitor was recommended as an outpatient. 11/30/2015 -- her co- payment for her skin substitute was over $350 per visit and she has declined this. 12/14/15: wound continues to improve. no fevers, chills, body aches or malaise. no interval changes regarding health status. 12/21/15: returns today for f/u. wound improving. no systemic s/s of infection. no interval changes regarding health status. she is hoping her wound is healed prior to vacation 01/09/16. Objective Constitutional Pulse regular. Respirations normal and unlabored. Afebrile. Vitals Time Taken: 11:29 AM, Height: 61 in, Weight: 170 lbs, BMI: 32.1, Temperature: 97.5 F, Pulse: 60 bpm, Respiratory Rate: 18 breaths/min, Blood Pressure: 148/73 mmHg. Eyes Nonicteric.  Reactive to light. Ears, Nose, Mouth, and Throat Lips, teeth, and gums WNL.Marland Kitchen Moist mucosa without lesions. Neck supple and nontender. No palpable supraclavicular or cervical adenopathy. Normal sized without goiter. Respiratory WNL. No retractions.. Breath sounds WNL, No rubs, rales, rhonchi, or wheeze.. Cardiovascular Heart rhythm and rate regular, no murmur or gallop.. Pedal Pulses WNL. No clubbing, cyanosis or edema. Lymphatic No adneopathy. No adenopathy. No adenopathy. Musculoskeletal Adexa without tenderness or enlargement.. Digits and nails w/o clubbing, cyanosis, infection, petechiae, ischemia, or inflammatory conditions.Marland Kitchen Psychiatric Judgement and insight Intact.. No evidence of depression, anxiety, or agitation.Marland Kitchen [name], [name] ([id number]) General Notes: the wound on the right lower extremity is completely healed Integumentary (Hair, Skin) No suspicious lesions. No crepitus or fluctuance. No peri-wound warmth or erythema. No masses.. Wound #3 status is Open. Original cause  of wound was Trauma. The wound is located on the Right,Anterior Lower Leg. The wound measures 0cm length x 0cm width x 0cm depth; 0cm^2 area and 0cm^3 volume. The wound is limited to skin breakdown. There is no tunneling or undermining noted. There is a none present amount of drainage noted. The wound margin is distinct with the outline attached to the wound base. There is no granulation within the wound bed. There is no necrotic tissue within the wound bed. The periwound skin appearance did not exhibit: Callus, Crepitus, Excoriation, Fluctuance, Friable, Induration, Localized Edema, Rash, Scarring, Dry/Scaly, Maceration, Moist, Atrophie Blanche, Cyanosis, Ecchymosis, Hemosiderin Staining, Mottled, Pallor, Rubor, Erythema. Periwound temperature was noted as No Abnormality. The periwound has tenderness on palpation. Assessment Active Problems ICD-10 E11.622 - Type 2 diabetes mellitus with other skin  ulcer S81.811A - Laceration without foreign body, right lower leg, initial encounter L97.212 - Non-pressure chronic ulcer of right calf with fat layer exposed I87.311 - Chronic venous hypertension (idiopathic) with ulcer of right lower extremity I89.0 - Lymphedema, not elsewhere classified Plan Discharge From [facility]: Discharge from [place] - protect newly healed wound site The wound is completely healed and I have recommended a bordered foam to protect the supple scar and application of compression stockings every day during her waking hours. She is discharged from the wound care services and will be seen back only if needed [name], [name] ([id number]) Electronic Signature(s) Signed: 12/28/2015 11:59:37 AM By: Christin Fudge MD, FACS Entered By: Christin Fudge on 12/28/2015 11:59:37 [name], [name] ([id number]) -------------------------------------------------------------------------------- SuperBill Details Patient Name: [name] Date of Service: 12/28/2015 Medical Record Number: [id number] Patient Account Number: [id number] Date of Birth/Sex: 1931/11/02 (82 y.o. Female) Treating RN: Primary Care Physician: [name] Other Clinician: Referring Physician: [name] Treating Physician/Extender: Frann Rider in Treatment: 13 Diagnosis Coding ICD-10 Codes Code Description E11.622 Type 2 diabetes mellitus with other skin ulcer S81.811A Laceration without foreign body, right lower leg, initial encounter L97.212 Non-pressure chronic ulcer of right calf with fat layer exposed I87.311 Chronic venous hypertension (idiopathic) with ulcer of right lower extremity I89.0 Lymphedema, not elsewhere classified Facility Procedures CPT4 Code: [id number] Description: [id number] - WOUND CARE VISIT-LEV 2 EST PT Modifier: Quantity: 1 Physician Procedures CPT4 Code Description: [id number] - WC PHYS LEVEL 2 - EST PT ICD-10 Description Diagnosis E11.622  Type 2 diabetes mellitus with other skin ulcer L97.212 Non-pressure chronic ulcer of right calf with fat layer I87.311 Chronic venous hypertension  (idiopathic) with ulcer of Modifier: exposed right lower Quantity: 1 extremity Electronic Signature(s) Signed: 12/28/2015 11:59:52 AM By: Christin Fudge MD, FACS Entered By: Christin Fudge on 12/28/2015 11:59:52

## 2016-01-01 ENCOUNTER — Encounter: Payer: Self-pay | Admitting: Internal Medicine

## 2016-01-01 ENCOUNTER — Ambulatory Visit (INDEPENDENT_AMBULATORY_CARE_PROVIDER_SITE_OTHER): Payer: Medicare Other | Admitting: Internal Medicine

## 2016-01-01 VITALS — BP 130/62 | HR 68 | Ht 61.5 in | Wt 175.8 lb

## 2016-01-01 DIAGNOSIS — R0602 Shortness of breath: Secondary | ICD-10-CM

## 2016-01-01 DIAGNOSIS — R55 Syncope and collapse: Secondary | ICD-10-CM

## 2016-01-01 NOTE — Patient Instructions (Addendum)
Medication Instructions:  Your physician recommends that you continue on your current medications as directed. Please refer to the Current Medication list given to you today.   Labwork: none  Testing/Procedures: Your physician has requested that you have a lexiscan myoview. For further information please visit [email or web address]. Please follow instruction sheet, as given.  [place]  Your caregiver has ordered a Stress Test with nuclear imaging. The purpose of this test is to evaluate the blood supply to your heart muscle. This procedure is referred to as a "Non-Invasive Stress Test." This is because other than having an IV started in your vein, nothing is inserted or "invades" your body. Cardiac stress tests are done to find areas of poor blood flow to the heart by determining the extent of coronary artery disease (CAD). Some patients exercise on a treadmill, which naturally increases the blood flow to your heart, while others who are  unable to walk on a treadmill due to physical limitations have a pharmacologic/chemical stress agent called Lexiscan . This medicine will mimic walking on a treadmill by temporarily increasing your coronary blood flow.   Please note: these test may take anywhere between 2-4 hours to complete  PLEASE REPORT TO [facility] AT THE FIRST DESK WILL DIRECT YOU WHERE TO GO  Date of Procedure: Tuesday, October 31  Arrival Time for Procedure: 7:15am  Instructions regarding medication:   __xx__ : Hold diabetes medication (metformin and glipizide) the morning of test   xx____:  Hold lasix the morning of your test.  PLEASE NOTIFY THE OFFICE AT LEAST 24 HOURS IN ADVANCE IF YOU ARE UNABLE TO KEEP YOUR APPOINTMENT.  [phone] AND  PLEASE NOTIFY NUCLEAR MEDICINE AT [facility] AT LEAST 24 HOURS IN ADVANCE IF YOU ARE UNABLE TO KEEP YOUR APPOINTMENT. [phone]  How to prepare for your Myoview test:  1. Do not eat or drink after  midnight 2. No caffeine for 24 hours prior to test 3. No smoking 24 hours prior to test. 4. Your medication may be taken with water.  If your doctor stopped a medication because of this test, do not take that medication. 5. Ladies, please do not wear dresses.  Skirts or pants are appropriate. Please wear a short sleeve shirt. 6. No perfume, cologne or lotion. 7. Wear comfortable walking shoes. No heels!            Follow-Up: Your physician recommends that you schedule a follow-up appointment in: 3 months with Dr. Saunders Revel.    Any Other Special Instructions Will Be Listed Below (If Applicable).     If you need a refill on your cardiac medications before your next appointment, please call your pharmacy.  Cardiac Nuclear Scanning A cardiac nuclear scan is used to check your heart for problems, such as the following:  A portion of the heart is not getting enough blood.  Part of the heart muscle has died, which happens with a heart attack.  The heart wall is not working normally.  In this test, a radioactive dye (tracer) is injected into your bloodstream. After the tracer has traveled to your heart, a scanning device is used to measure how much of the tracer is absorbed by or distributed to various areas of your heart. LET [employer] CARE PROVIDER KNOW ABOUT:  Any allergies you have.  All medicines you are taking, including vitamins, herbs, eye drops, creams, and over-the-counter medicines.  Previous problems you or members of your family have had with the use  of anesthetics.  Any blood disorders you have.  Previous surgeries you have had.  Medical conditions you have.  RISKS AND COMPLICATIONS Generally, this is a safe procedure. However, as with any procedure, problems can occur. Possible problems include:   Serious chest pain.  Rapid heartbeat.  Sensation of warmth in your chest. This usually passes quickly. BEFORE THE PROCEDURE Ask your health care provider about  changing or stopping your regular medicines. PROCEDURE This procedure is usually done at a hospital and takes 2-4 hours.  An IV tube is inserted into one of your veins.  Your health care provider will inject a small amount of radioactive tracer through the tube.  You will then wait for 20-40 minutes while the tracer travels through your bloodstream.  You will lie down on an exam table so images of your heart can be taken. Images will be taken for about 15-20 minutes.  You will exercise on a treadmill or stationary bike. While you exercise, your heart activity will be monitored with an electrocardiogram (ECG), and your blood pressure will be checked.  If you are unable to exercise, you may be given a medicine to make your heart beat faster.  When blood flow to your heart has peaked, tracer will again be injected through the IV tube.  After 20-40 minutes, you will get back on the exam table and have more images taken of your heart.  When the procedure is over, your IV tube will be removed. AFTER THE PROCEDURE  You will likely be able to leave shortly after the test. Unless your health care provider tells you otherwise, you may return to your normal schedule, including diet, activities, and medicines.  Make sure you find out how and when you will get your test results.   This information is not intended to replace advice given to you by your health care provider. Make sure you discuss any questions you have with your health care provider.   Document Released: 03/21/2004 Document Revised: 03/01/2013 Document Reviewed: 02/02/2013 Elsevier Interactive Patient Education [employer].

## 2016-01-01 NOTE — Progress Notes (Signed)
Follow-up Outpatient Visit Date: 01/01/2016  Chief Complaint: Follow-up syncope  HPI:  Ms. Ange is a 82 y.o. year-old female with history of type 2 diabetes mellitus and hypertension, who presents for follow-up of 3 syncopal episodes in the last few months. I first met the patient last month after she was hospitalized following a syncopal episode while riding in her daughter's car. She had additional brief syncopal episode in the emergency department. Workup at that time was unrevealing. We proceeded a 30 day event monitor, which was notable only for short episodes of paroxysmal SVT that were asymptomatic.  Since our last visit, the patient has been feeling well. She has not had any further episodes of lightheadedness or syncope. Her mobility has been limited due to a right lower extremity wound that is gradually healing. She denies chest pain, palpitations, orthopnea, and PND. She notes occasional ankle swelling as well as chronic shortness of breath that this at her baseline.  --------------------------------------------------------------------------------------------------  Cardiovascular History & Procedures: Cardiovascular Problems:  Syncope  Risk Factors:  Diabetes mellitus, hypertension, and age  Cath/PCI:  None.  CV Surgery:  None.  EP Procedures and Devices:  30 day event monitor (12/2015): Predominant rhythm was sinus with an average rate 60 bpm (range 45-134 bpm). Occasional PACs and PVCs were noted. There were 2 episodes of superventricular tachycardia lasting up to 14 beats with a maximum rate of 136 bpm. No sustained arrhythmias were identified. Symptoms of feeling "tired and fatigued" we'll corresponded to sinus rhythm and sinus rhythm with PACs.  Non-Invasive Evaluation(s):  Transthoracic echocardiogram (11/21/15): Normal LV size and function (EF 60-65%) with grade 1 diastolic dysfunction. Mild MR. Normal RV size and function. Normal pulmonary artery  pressure.  Carotid Doppler (11/22/15): No hemodynamically significant plaque or stenosis in either cervical carotid artery.  Recent CV Pertinent Labs: Lab Results  Component Value Date   CHOL 169 08/16/2015   HDL 55 08/16/2015   LDLCALC 85 08/16/2015   TRIG 145 08/16/2015   CHOLHDL 3.1 08/16/2015   K 3.8 11/22/2015   BUN 25 (H) 11/22/2015   BUN 24 11/21/2015   CREATININE 0.94 11/22/2015    Past medical and surgical history were reviewed and updated in EPIC.   Outpatient Encounter Prescriptions as of 01/01/2016  Medication Sig  . Ascorbic Acid (VITAMIN C PO) Take 1 tablet by mouth daily.   Marland Kitchen aspirin 81 MG tablet Take 81 mg by mouth daily.   . budesonide (ENTOCORT EC) 3 MG 24 hr capsule TAKE 3  CAPSULES  ORAL, DAILY  . Cholecalciferol (VITAMIN D) 2000 UNITS CAPS Take 1 capsule by mouth daily.   . furosemide (LASIX) 20 MG tablet TAKE ONE TABLET BY MOUTH DAILY (Patient taking differently: TAKE ONE TABLET BY MOUTH DAILY AS NEEDED.)  . glipiZIDE (GLUCOTROL XL) 2.5 MG 24 hr tablet Take 1 tablet (2.5 mg total) by mouth daily with breakfast.  . lisinopril (PRINIVIL,ZESTRIL) 10 MG tablet Take 1 tablet (10 mg total) by mouth daily.  . metFORMIN (GLUCOPHAGE) 500 MG tablet Take 1 tablet (500 mg total) by mouth 2 (two) times daily with a meal.  . Multiple Vitamin tablet Take 1 tablet by mouth every evening. Reported on 03/13/2015  . Multiple Vitamins-Minerals (ZINC PO) Take 1 tablet by mouth daily.  . simvastatin (ZOCOR) 20 MG tablet TAKE 1 TABLET BY MOUTH AT BEDTIME  . VITAMIN A OP Apply 1 tablet to eye daily.   No facility-administered encounter medications on file as of 01/01/2016.  Allergies: Morphine sulfate and Penicillins  Social History   Social History  . Marital status: Widowed    Spouse name: N/A  . Number of children: N/A  . Years of education: N/A   Occupational History  . Not on file.   Social History Main Topics  . Smoking status: Former Smoker    Quit date:  03/09/1978  . Smokeless tobacco: Never Used  . Alcohol use 0.6 oz/week    1 Glasses of wine per week     Comment: none since hospitalization  . Drug use: No  . Sexual activity: Not on file   Other Topics Concern  . Not on file   Social History Narrative  . No narrative on file    Family History  Problem Relation Age of Onset  . Hypertension Mother   . Diabetes Mother   . Lung cancer Father     Review of Systems: A 12-system review of systems was performed and was negative except as noted in the HPI.  --------------------------------------------------------------------------------------------------  Physical Exam: BP 130/62 (BP Location: Left Arm, Patient Position: Sitting, Cuff Size: Normal)   Pulse 68   Ht 5' 1.5" (1.562 m)   Wt 175 lb 12 oz (79.7 kg)   BMI 32.67 kg/m   General:  Overweight elderly woman seated comfortably in the exam room. She is accompanied by her daughter. HEENT: No conjunctival pallor or scleral icterus.  Moist mucous membranes.  OP clear. Neck: Supple without lymphadenopathy, thyromegaly, JVD, or HJR.  No carotid bruit. Lungs: Normal work of breathing.  Clear to auscultation bilaterally without wheezes or crackles. Heart: Regular rate and rhythm without murmurs, rubs, or gallops.  Non-displaced PMI. Abd: Bowel sounds present.  Soft, NT/ND without hepatosplenomegaly Ext: Trace pretibial edema.  Radial, PT, and DP pulses are 2+ bilaterally. Skin: Right shin wound is covered with clean dressing. Otherwise, no rash. Skin is warm and dry.  Lab Results  Component Value Date   WBC 8.7 11/22/2015   HGB 12.2 11/22/2015   HCT 35.3 11/22/2015   MCV 90.2 11/22/2015   PLT 253 11/22/2015    Lab Results  Component Value Date   NA 139 11/22/2015   K 3.8 11/22/2015   CL 108 11/22/2015   CO2 28 11/22/2015   BUN 25 (H) 11/22/2015   CREATININE 0.94 11/22/2015   GLUCOSE 113 (H) 11/22/2015   ALT 31 11/21/2015    Lab Results  Component Value Date    CHOL 169 08/16/2015   HDL 55 08/16/2015   LDLCALC 85 08/16/2015   TRIG 145 08/16/2015   CHOLHDL 3.1 08/16/2015    --------------------------------------------------------------------------------------------------  ASSESSMENT AND PLAN: 1. Syncope: The patient has not had any further episodes of syncope or lightheadedness since her last visit. Etiology remains somewhat unclear, though I am most suspicious for a vasovagal mechanism. Recent event monitor was notable only for 2 brief episodes of superventricular tachycardia that were asymptomatic and likely unrelated to her prior syncopal episodes. We discussed further evaluation options and have agreed to a pharmacologic myocardial perfusion stress test, given that the patient has multiple cardiac risk factors and syncope and shortness of breath may represent her anginal equivalents. I have asked the patient to refrain from driving until this has been completed. If the stress test is low risk and Ms. Szczygiel does not have any further syncopal episodes, I think it would be reasonable for her to resume driving and traveling at that time.  2.  Shortness of breath: This is been a  chronic problem for the patient. Her lungs are clear on exam today. As above, we have agreed to proceed with a myocardial perfusion stress test to evaluate for significant coronary insufficiency. We will not make any medication changes today.  Follow-up: Return to clinic in 3 months.  Nelva Bush, MD 01/01/2016 4:29 PM

## 2016-01-04 ENCOUNTER — Ambulatory Visit: Payer: Medicare Other | Admitting: Surgery

## 2016-01-07 ENCOUNTER — Telehealth: Payer: Self-pay | Admitting: Internal Medicine

## 2016-01-07 NOTE — Telephone Encounter (Signed)
Attempted to contact pt at home number regarding myoview instructions . No answer. Called cell number. Daugther, Jackelyn Poling, (on [place]) answered. Review instructions w/daughter who verbalized understanding and will try to reach patient now.

## 2016-01-08 ENCOUNTER — Encounter
Admission: RE | Admit: 2016-01-08 | Discharge: 2016-01-08 | Disposition: A | Discharge status: Home / Self Care | Payer: Medicare Other | Source: Ambulatory Visit | Attending: Internal Medicine | Admitting: Internal Medicine

## 2016-01-08 DIAGNOSIS — R55 Syncope and collapse: Secondary | ICD-10-CM | POA: Insufficient documentation

## 2016-01-08 DIAGNOSIS — R0602 Shortness of breath: Secondary | ICD-10-CM | POA: Diagnosis present

## 2016-01-08 LAB — NM MYOCAR MULTI W/SPECT W/WALL MOTION / EF
CHL CUP NUCLEAR SSS: 0
CHL CUP RESTING HR STRESS: 63 {beats}/min
LV dias vol: 70 mL (ref 46–106)
LVSYSVOL: 21 mL
NUC STRESS TID: 0.75
Peak HR: 112 {beats}/min
Percent HR: 81 %
SDS: 0
SRS: 15

## 2016-01-08 MED ORDER — TECHNETIUM TC 99M TETROFOSMIN IV KIT
13.0190 | PACK | Freq: Once | INTRAVENOUS | Status: AC | PRN
  Administered 2016-01-08: 13.019 via INTRAVENOUS

## 2016-01-08 MED ORDER — TECHNETIUM TC 99M TETROFOSMIN IV KIT
33.0000 | PACK | Freq: Once | INTRAVENOUS | Status: AC | PRN
  Administered 2016-01-08: 32.152 via INTRAVENOUS

## 2016-01-08 MED ORDER — REGADENOSON 0.4 MG/5ML IV SOLN
0.4000 mg | Freq: Once | INTRAVENOUS | Status: AC
  Administered 2016-01-08: 0.4 mg via INTRAVENOUS
  Filled 2016-01-08: qty 5

## 2016-01-16 ENCOUNTER — Ambulatory Visit (INDEPENDENT_AMBULATORY_CARE_PROVIDER_SITE_OTHER): Payer: Medicare Other | Admitting: Podiatry

## 2016-01-16 ENCOUNTER — Encounter: Payer: Self-pay | Admitting: Podiatry

## 2016-01-16 DIAGNOSIS — M779 Enthesopathy, unspecified: Secondary | ICD-10-CM

## 2016-01-16 DIAGNOSIS — M7752 Other enthesopathy of left foot: Secondary | ICD-10-CM

## 2016-01-16 DIAGNOSIS — Q828 Other specified congenital malformations of skin: Secondary | ICD-10-CM | POA: Diagnosis not present

## 2016-01-16 DIAGNOSIS — M778 Other enthesopathies, not elsewhere classified: Secondary | ICD-10-CM

## 2016-01-16 DIAGNOSIS — G5792 Unspecified mononeuropathy of left lower limb: Secondary | ICD-10-CM

## 2016-01-16 NOTE — Progress Notes (Signed)
She presents today with a chief complaint of a painful corn to the fifth digit of the left foot.  Objective: Vital signs are stable alert and oriented 3. Pulses are palpable. Fifth digit left foot is mildly edematous and painful at the level of the PIPJ with an overlying reactive hyperkeratotic lesion. There is no signs of infection noted. No cellulitis drainage or odor. No open lesions or wounds are noted.  Assessment: Hammertoe deformity capsulitis PIPJ fifth left with overlying porokeratotic lesion.  Plan: Debrided reactive hyperkeratotic lesion after the injection of dexamethasone and local anesthetic to the point of maximal tenderness of the toe.

## 2016-01-21 ENCOUNTER — Ambulatory Visit (INDEPENDENT_AMBULATORY_CARE_PROVIDER_SITE_OTHER): Payer: Medicare Other | Admitting: Family Medicine

## 2016-01-21 ENCOUNTER — Encounter: Payer: Self-pay | Admitting: Family Medicine

## 2016-01-21 VITALS — BP 108/64 | HR 62 | Temp 97.7°F | Resp 14 | Wt 174.8 lb

## 2016-01-21 DIAGNOSIS — E7800 Pure hypercholesterolemia, unspecified: Secondary | ICD-10-CM | POA: Diagnosis not present

## 2016-01-21 DIAGNOSIS — E119 Type 2 diabetes mellitus without complications: Secondary | ICD-10-CM

## 2016-01-21 DIAGNOSIS — I10 Essential (primary) hypertension: Secondary | ICD-10-CM

## 2016-01-21 MED ORDER — METFORMIN HCL 500 MG PO TABS: 500.0000 mg | ORAL_TABLET | Freq: Two times a day (BID) | ORAL | 3 refills | Status: DC

## 2016-01-21 MED ORDER — GLIPIZIDE ER 2.5 MG PO TB24: 2.5000 mg | ORAL_TABLET | Freq: Every day | ORAL | 3 refills | Status: DC

## 2016-01-21 MED ORDER — LISINOPRIL 10 MG PO TABS: 10.0000 mg | ORAL_TABLET | Freq: Every day | ORAL | 3 refills | Status: DC

## 2016-01-21 NOTE — Progress Notes (Signed)
[name]    DOB: 1931/07/03   82 y.o.   MRN: [id number] Visit Date: 01/21/2016  Today's Provider: [name], PA   Chief Complaint  Patient presents with  . Hypertension  . Hyperlipidemia  . Diabetes  . Follow-up   Subjective:    HPI Patient is here for follow up and establish care with Simona Huh since Dr Venia Minks left. Patient is doing well on current medication regimen.   Diabetes Mellitus Type II, Follow-up:   Lab Results  Component Value Date   HGBA1C 8.5 10/23/2015   HGBA1C 9.1 08/15/2015   HGBA1C 8.7 05/14/2015   Last seen for diabetes 4 months ago.  Management since then includes continue Metformin with Glipizide and try to follow diet. She reports good compliance with treatment. She is not having side effects.  Current symptoms include none and have been stable. Home blood sugar records: being checked sometimes  Episodes of hypoglycemia? no   Current Insulin Regimen: none Weight trend: stable Current diet: well balanced Current exercise: none  ------------------------------------------------------------------------   Hypertension, follow-up:  BP Readings from Last 3 Encounters:  01/21/16 108/64  01/01/16 130/62  11/30/15 (!) 92/54    She was last seen for hypertension 4 months ago.  BP at that visit was 92/54. Management since that visit includes 128/86.She reports excellent compliance with treatment. She is not having side effects.  She is not exercising. She is adherent to low salt diet.   Outside blood pressures are not being checked. She is experiencing none.  Patient denies chest pain, irregular heart beat and palpitations.   Cardiovascular risk factors include advanced age (older than 12 for men, 40 for women), diabetes mellitus, dyslipidemia and obesity (BMI >= 30 kg/m2).  Use of agents associated with hypertension: none.    ------------------------------------------------------------------------    Lipid/Cholesterol, Follow-up:   Last seen for this 4 months ago.  Management since that visit includes continue medication.  Last Lipid Panel:    Component Value Date/Time   CHOL 169 08/16/2015 0902   TRIG 145 08/16/2015 0902   HDL 55 08/16/2015 0902   CHOLHDL 3.1 08/16/2015 0902   LDLCALC 85 08/16/2015 0902    She reports excellent compliance with treatment. She is not having side effects.   Wt Readings from Last 3 Encounters:  01/21/16 174 lb 12.8 oz (79.3 kg)  01/01/16 175 lb 12 oz (79.7 kg)  11/30/15 172 lb 3.2 oz (78.1 kg)    ------------------------------------------------------------------------    Previous Medications   ASCORBIC ACID (VITAMIN C PO)    Take 1 tablet by mouth daily.    ASPIRIN 81 MG TABLET    Take 81 mg by mouth daily.    BUDESONIDE (ENTOCORT EC) 3 MG 24 HR CAPSULE    TAKE 3  CAPSULES  ORAL, DAILY   CHOLECALCIFEROL (VITAMIN D) 2000 UNITS CAPS    Take 1 capsule by mouth daily.    FUROSEMIDE (LASIX) 20 MG TABLET    TAKE ONE TABLET BY MOUTH DAILY   GLIPIZIDE (GLUCOTROL XL) 2.5 MG 24 HR TABLET    Take 1 tablet (2.5 mg total) by mouth daily with breakfast.   LISINOPRIL (PRINIVIL,ZESTRIL) 10 MG TABLET    Take 1 tablet (10 mg total) by mouth daily.   METFORMIN (GLUCOPHAGE) 500 MG TABLET    Take 1 tablet (500 mg total) by mouth 2 (two) times daily with a meal.   MULTIPLE VITAMIN TABLET    Take 1 tablet by mouth every evening. Reported  on 03/13/2015   MULTIPLE VITAMINS-MINERALS (ZINC PO)    Take 1 tablet by mouth daily.   SIMVASTATIN (ZOCOR) 20 MG TABLET    TAKE 1 TABLET BY MOUTH AT BEDTIME   VITAMIN A OP    Apply 1 tablet to eye daily.    Review of Systems  Constitutional: Negative.   Respiratory: Negative.   Cardiovascular: Negative.   Endocrine: Negative.   Musculoskeletal: Negative.     Social History  Substance Use Topics  . Smoking status: Former Smoker    Quit  date: 03/09/1978  . Smokeless tobacco: Never Used  . Alcohol use 0.6 oz/week    1 Glasses of wine per week     Comment: none since hospitalization   Objective:   BP 108/64 (BP Location: Right Arm, Patient Position: Sitting, Cuff Size: Normal)   Pulse 62   Temp 97.7 F (36.5 C) (Oral)   Resp 14   Wt 174 lb 12.8 oz (79.3 kg)   BMI 32.49 kg/m   Physical Exam  Constitutional: She is oriented to person, place, and time. She appears well-developed and well-nourished. No distress.  HENT:  Head: Normocephalic and atraumatic.  Right Ear: Hearing normal.  Left Ear: Hearing normal.  Nose: Nose normal.  Mouth/Throat: Oropharynx is clear and moist.  Eyes: Conjunctivae and lids are normal. Right eye exhibits no discharge. Left eye exhibits no discharge. No scleral icterus.  Neck: Normal range of motion. Neck supple.  Cardiovascular: Normal rate and regular rhythm.   Pulmonary/Chest: Effort normal and breath sounds normal. No respiratory distress.  Abdominal: Soft. Bowel sounds are normal.  Musculoskeletal: Normal range of motion.  Neurological: She is alert and oriented to person, place, and time.  Skin: Skin is intact. No lesion and no rash noted.  Well healed wound on right anterior lower leg.   Psychiatric: She has a normal mood and affect. Her speech is normal and behavior is normal. Thought content normal.      Assessment & Plan:     1. Type 2 diabetes mellitus without complication, without long-term current use of insulin (HCC) Stable and tolerating Glipizide and Metformin without hypoglycemic episodes. No further syncopal episodes as she had in August 2017. Has had cardiology evaluation and will have follow up with Dr. Saunders Revel in January 2018. Had podiatrist follow up on corn on the left little toe last week. FBS around 117 and evening BS 156 on average. Will recheck labs and follow up in 3-4 months pending results. - glipiZIDE (GLUCOTROL XL) 2.5 MG 24 hr tablet; Take 1 tablet (2.5 mg  total) by mouth daily with breakfast.  Dispense: 90 tablet; Refill: 3 - metFORMIN (GLUCOPHAGE) 500 MG tablet; Take 1 tablet (500 mg total) by mouth 2 (two) times daily with a meal.  Dispense: 180 tablet; Refill: 3 - CBC with Differential/Platelet - Comprehensive metabolic panel - Hemoglobin A1c - Lipid panel  2. Hypercholesteremia Tolerating simvastatin 20 mg qd without side effects. Recheck labs and continue low fat diet.  - Comprehensive metabolic panel - Lipid panel  3. Essential (primary) hypertension Stable and well controlled with Lisinopril 10 mg qd. Will refill medication and recheck labs. - lisinopril (PRINIVIL,ZESTRIL) 10 MG tablet; Take 1 tablet (10 mg total) by mouth daily.  Dispense: 90 tablet; Refill: 3 - CBC with Differential/Platelet - Comprehensive metabolic panel - Lipid panel

## 2016-01-23 ENCOUNTER — Other Ambulatory Visit: Payer: Self-pay | Admitting: Gastroenterology

## 2016-01-23 ENCOUNTER — Telehealth: Payer: Self-pay

## 2016-01-23 LAB — CBC WITH DIFFERENTIAL/PLATELET
BASOS ABS: 0 10*3/uL (ref 0.0–0.2)
Basos: 0 %
EOS (ABSOLUTE): 0.1 10*3/uL (ref 0.0–0.4)
EOS: 2 %
HEMATOCRIT: 40.5 % (ref 34.0–46.6)
HEMOGLOBIN: 13.9 g/dL (ref 11.1–15.9)
IMMATURE GRANULOCYTES: 1 %
Immature Grans (Abs): 0.1 10*3/uL (ref 0.0–0.1)
LYMPHS ABS: 4.4 10*3/uL — AB (ref 0.7–3.1)
LYMPHS: 53 %
MCH: 30.9 pg (ref 26.6–33.0)
MCHC: 34.3 g/dL (ref 31.5–35.7)
MCV: 90 fL (ref 79–97)
MONOCYTES: 10 %
Monocytes Absolute: 0.8 10*3/uL (ref 0.1–0.9)
NEUTROS PCT: 34 %
Neutrophils Absolute: 2.8 10*3/uL (ref 1.4–7.0)
Platelets: 265 10*3/uL (ref 150–379)
RBC: 4.5 x10E6/uL (ref 3.77–5.28)
RDW: 12.3 % (ref 12.3–15.4)
WBC: 8.3 10*3/uL (ref 3.4–10.8)

## 2016-01-23 LAB — COMPREHENSIVE METABOLIC PANEL
ALBUMIN: 4 g/dL (ref 3.5–4.7)
ALK PHOS: 44 IU/L (ref 39–117)
ALT: 22 IU/L (ref 0–32)
AST: 13 IU/L (ref 0–40)
Albumin/Globulin Ratio: 1.8 (ref 1.2–2.2)
BUN/Creatinine Ratio: 26 (ref 12–28)
BUN: 25 mg/dL (ref 8–27)
Bilirubin Total: 0.5 mg/dL (ref 0.0–1.2)
CALCIUM: 9.5 mg/dL (ref 8.7–10.3)
CO2: 28 mmol/L (ref 18–29)
CREATININE: 0.95 mg/dL (ref 0.57–1.00)
Chloride: 101 mmol/L (ref 96–106)
GFR calc Af Amer: 65 mL/min/{1.73_m2} (ref >59)
GFR, EST NON AFRICAN AMERICAN: 56 mL/min/{1.73_m2} — AB (ref >59)
GLUCOSE: 131 mg/dL — AB (ref 65–99)
Globulin, Total: 2.2 g/dL (ref 1.5–4.5)
Potassium: 4.2 mmol/L (ref 3.5–5.2)
Sodium: 145 mmol/L — ABNORMAL HIGH (ref 134–144)
Total Protein: 6.2 g/dL (ref 6.0–8.5)

## 2016-01-23 LAB — LIPID PANEL
Chol/HDL Ratio: 2.8 ratio units (ref 0.0–4.4)
Cholesterol, Total: 167 mg/dL (ref 100–199)
HDL: 59 mg/dL (ref >39)
LDL Calculated: 84 mg/dL (ref 0–99)
TRIGLYCERIDES: 118 mg/dL (ref 0–149)
VLDL CHOLESTEROL CAL: 24 mg/dL (ref 5–40)

## 2016-01-23 LAB — HEMOGLOBIN A1C
ESTIMATED AVERAGE GLUCOSE: 157 mg/dL
HEMOGLOBIN A1C: 7.1 % — AB (ref 4.8–5.6)

## 2016-01-23 NOTE — Telephone Encounter (Signed)
-----   Message from Margo Common, [place] sent at 01/22/2016  5:18 PM EST ----- Preliminary report shows normal CBC. Awaiting completed reports of the remainder of tests.

## 2016-01-23 NOTE — Telephone Encounter (Signed)
Patient has been advised. KW 

## 2016-01-25 ENCOUNTER — Telehealth: Payer: Self-pay

## 2016-01-25 NOTE — Telephone Encounter (Signed)
-----   Message from Margo Common, [place] sent at 01/25/2016  3:27 PM EST ----- All blood tests essentially normal except blood sugar slightly up. Hgb A1C is better than last check 3 months ago. This is all good news. Continue present medications and recheck in 3 months.

## 2016-01-25 NOTE — Telephone Encounter (Signed)
Patient advised. Follow up appointment scheduled.  

## 2016-02-06 ENCOUNTER — Ambulatory Visit: Payer: Medicare Other | Admitting: Podiatry

## 2016-02-07 ENCOUNTER — Ambulatory Visit (INDEPENDENT_AMBULATORY_CARE_PROVIDER_SITE_OTHER): Payer: Medicare Other | Admitting: Podiatry

## 2016-02-07 DIAGNOSIS — L851 Acquired keratosis [keratoderma] palmaris et plantaris: Secondary | ICD-10-CM

## 2016-02-07 DIAGNOSIS — M79671 Pain in right foot: Secondary | ICD-10-CM

## 2016-02-07 DIAGNOSIS — M79672 Pain in left foot: Secondary | ICD-10-CM

## 2016-02-07 DIAGNOSIS — L84 Corns and callosities: Secondary | ICD-10-CM

## 2016-02-10 NOTE — Progress Notes (Signed)
Subjective: Patient presents to the office today for chief complaint of painful callus lesions of the feet. Patient states that the pain is ongoing and is affecting their ability to ambulate without pain. Patient presents today for further treatment and evaluation.  Objective:  Physical Exam General: Alert and oriented x3 in no acute distress  Dermatology: Hyperkeratotic lesion present on the distal tufts of the bilateral digits. Pain on palpation with a central nucleated core noted.  Skin is warm, dry and supple bilateral lower extremities. Negative for open lesions or macerations.  Vascular: Palpable pedal pulses bilaterally. No edema or erythema noted. Capillary refill within normal limits.  Neurological: Epicritic and protective threshold grossly intact bilaterally.   Musculoskeletal Exam: Pain on palpation at the keratotic lesion noted. Range of motion within normal limits bilateral. Muscle strength 5/5 in all groups bilateral.  Assessment: #1 painful callus lesions distal tuft bilateral digits 4 #2 pain in bilateral toes #3 porokeratosis bilateral toes   Plan of Care:  #1 Patient evaluated #2 Excisional debridement of  keratoic lesion using a chisel blade was performed without incident.  #3 Treated area(s) with Salinocaine and dressed with light dressing. #4 silicone toe caps were dispensed. Discussed conservative management of callus lesions. #5 Patient is to return to the clinic PRN.   Edrick Kins, [place]

## 2016-02-14 LAB — HM DIABETES EYE EXAM

## 2016-03-19 ENCOUNTER — Encounter: Payer: Self-pay | Admitting: Podiatry

## 2016-03-19 ENCOUNTER — Ambulatory Visit (INDEPENDENT_AMBULATORY_CARE_PROVIDER_SITE_OTHER): Payer: Medicare Other | Admitting: Podiatry

## 2016-03-19 DIAGNOSIS — M79676 Pain in unspecified toe(s): Secondary | ICD-10-CM

## 2016-03-19 DIAGNOSIS — M7752 Other enthesopathy of left foot: Secondary | ICD-10-CM

## 2016-03-19 DIAGNOSIS — E119 Type 2 diabetes mellitus without complications: Secondary | ICD-10-CM

## 2016-03-19 DIAGNOSIS — Q828 Other specified congenital malformations of skin: Secondary | ICD-10-CM | POA: Diagnosis not present

## 2016-03-19 DIAGNOSIS — B351 Tinea unguium: Secondary | ICD-10-CM

## 2016-03-19 NOTE — Progress Notes (Signed)
She presents today with chief complaint of painful corns and calluses particularly that of the fifth toe left foot.  Objective: Vital signs are stable to alert and oriented 3 adductor varus rotated hammertoe deformity fifth left with an overlying bursitis and capsulitis of the PIPJ she also has distal clavi to the second digit of the right foot third digit of the left foot. Toenails are long thick yellow dystrophic onychomycotic.  Assessment: Capsulitis bursitis fifth digit left reactive hyperkeratoses second digit right foot fourth digit left foot fifth digit left foot. Pain in limb secondary onychomycosis.  Plan: Debrided all reactive hyperkeratotic lesion and injected dexamethasone to the point of maximal tenderness the PIPJ. Debrided toenails 1 through 5.

## 2016-04-02 ENCOUNTER — Encounter: Payer: Self-pay | Admitting: Internal Medicine

## 2016-04-02 ENCOUNTER — Ambulatory Visit (INDEPENDENT_AMBULATORY_CARE_PROVIDER_SITE_OTHER): Payer: Medicare Other | Admitting: Internal Medicine

## 2016-04-02 VITALS — BP 130/70 | HR 67 | Ht 61.5 in | Wt 178.0 lb

## 2016-04-02 DIAGNOSIS — R55 Syncope and collapse: Secondary | ICD-10-CM | POA: Diagnosis not present

## 2016-04-02 DIAGNOSIS — M7989 Other specified soft tissue disorders: Secondary | ICD-10-CM

## 2016-04-02 DIAGNOSIS — I10 Essential (primary) hypertension: Secondary | ICD-10-CM

## 2016-04-02 NOTE — Progress Notes (Signed)
Follow-up Outpatient Visit Date: 04/02/2016  Chief Complaint: Follow-up syncope  HPI:  [name] is a 83 y.o. year-old female with history of type 2 diabetes mellitus and hypertension, who presents for follow-up of 3 syncopal episodes this summer. I last saw her on 01/01/16, which time she had felt well without further syncopal episodes. She remained concerned about possible cardiac etiologies for her episodes. We therefore agreed to proceed with myocardial perfusion stress test, which showed no significant ischemia and a normal LVEF. The patient has remained active without any further lightheadedness or syncope. She has been trying to stay well-hydrated and is drinking more water. She denies chest pain. She has chronic exertional dyspnea when going up stairs, which has been unchanged for years. She has noted a gradual decrease in her stamina over several years. She denies orthopnea, PND and palpitations. She notes mild lower extremity edema for which she uses as needed furosemide one to 2 days per week.  --------------------------------------------------------------------------------------------------  Cardiovascular History & Procedures: Cardiovascular Problems:  Syncope  Risk Factors:  Diabetes mellitus, hypertension, and age  Cath/PCI:  None.  CV Surgery:  None.  EP Procedures and Devices:  30 day event monitor (12/2015): Predominant rhythm was sinus with an average rate 60 bpm (range 45-134 bpm). Occasional PACs and PVCs were noted. There were 2 episodes of superventricular tachycardia lasting up to 14 beats with a maximum rate of 136 bpm. No sustained arrhythmias were identified. Symptoms of feeling "tired and fatigued" we'll corresponded to sinus rhythm and sinus rhythm with PACs.  Non-Invasive Evaluation(s):  Pharmacologic myocardial perfusion stress test (01/08/16): Low risk study with small apical anterior and apical defect more pronounced on the rest images consistent  with shifting breast attenuation. No ischemia identified. Normal LVEF (55-65%).  Transthoracic echocardiogram (11/21/15): Normal LV size and function (EF 60-65%) with grade 1 diastolic dysfunction. Mild MR. Normal RV size and function. Normal pulmonary artery pressure.  Carotid Doppler (11/22/15): No hemodynamically significant plaque or stenosis in either cervical carotid artery.  Recent CV Pertinent Labs: Lab Results  Component Value Date   CHOL 167 01/22/2016   HDL 59 01/22/2016   LDLCALC 84 01/22/2016   TRIG 118 01/22/2016   CHOLHDL 2.8 01/22/2016   K 4.2 01/22/2016   BUN 25 01/22/2016   CREATININE 0.95 01/22/2016    Past medical and surgical history were reviewed and updated in EPIC.   Outpatient Encounter Prescriptions as of 04/02/2016  Medication Sig  . aspirin 81 MG tablet Take 81 mg by mouth daily.   . budesonide (ENTOCORT EC) 3 MG 24 hr capsule TAKE 3  CAPSULES  ORAL, DAILY  . Cholecalciferol (VITAMIN D) 2000 UNITS CAPS Take 1 capsule by mouth daily.   . furosemide (LASIX) 20 MG tablet TAKE ONE TABLET BY MOUTH DAILY (Patient taking differently: TAKE ONE TABLET BY MOUTH DAILY AS NEEDED.)  . glipiZIDE (GLUCOTROL XL) 2.5 MG 24 hr tablet Take 1 tablet (2.5 mg total) by mouth daily with breakfast.  . lisinopril (PRINIVIL,ZESTRIL) 10 MG tablet Take 1 tablet (10 mg total) by mouth daily.  . metFORMIN (GLUCOPHAGE) 500 MG tablet Take 1 tablet (500 mg total) by mouth 2 (two) times daily with a meal.  . Multiple Vitamin tablet Take 1 tablet by mouth every evening. Reported on 03/13/2015  . simvastatin (ZOCOR) 20 MG tablet TAKE 1 TABLET BY MOUTH AT BEDTIME  . [DISCONTINUED] Ascorbic Acid (VITAMIN C PO) Take 1 tablet by mouth daily.   . [DISCONTINUED] Multiple Vitamins-Minerals (ZINC PO) Take  1 tablet by mouth daily.  . [DISCONTINUED] VITAMIN A OP Apply 1 tablet to eye daily.   No facility-administered encounter medications on file as of 04/02/2016.     Allergies: Morphine sulfate and  Penicillins  Social History   Social History  . Marital status: Widowed    Spouse name: N/A  . Number of children: N/A  . Years of education: N/A   Occupational History  . Not on file.   Social History Main Topics  . Smoking status: Former Smoker    Quit date: 03/09/1978  . Smokeless tobacco: Never Used  . Alcohol use 0.6 oz/week    1 Glasses of wine per week     Comment: none since hospitalization  . Drug use: No  . Sexual activity: Not on file   Other Topics Concern  . Not on file   Social History Narrative  . No narrative on file    Family History  Problem Relation Age of Onset  . Hypertension Mother   . Diabetes Mother   . Lung cancer Father     Review of Systems: A 12-system review of systems was performed and was negative except as noted in the HPI.  --------------------------------------------------------------------------------------------------  Physical Exam: BP 130/70 (BP Location: Left Arm, Patient Position: Sitting, Cuff Size: Normal)   Pulse 67   Ht 5' 1.5" (1.562 m)   Wt 178 lb (80.7 kg)   BMI 33.09 kg/m   General:  Overweight elderly woman seated comfortably in the exam room. She is accompanied by her daughter. HEENT: No conjunctival pallor or scleral icterus.  Moist mucous membranes.  OP clear. Neck: Supple without lymphadenopathy, thyromegaly, JVD, or HJR. Lungs: Normal work of breathing.  Clear to auscultation bilaterally without wheezes or crackles. Heart: Regular rate and rhythm without murmurs, rubs, or gallops.  Non-displaced PMI. Abd: Bowel sounds present.  Soft, NT/ND without hepatosplenomegaly Ext: No significant lower extremity edema. Radial, PT, and DP pulses are 2+ bilaterally. Skin: Skin is warm and dry.  Lab Results  Component Value Date   WBC 8.3 01/22/2016   HGB 12.2 11/22/2015   HCT 40.5 01/22/2016   MCV 90 01/22/2016   PLT 265 01/22/2016    Lab Results  Component Value Date   NA 145 (H) 01/22/2016   K 4.2  01/22/2016   CL 101 01/22/2016   CO2 28 01/22/2016   BUN 25 01/22/2016   CREATININE 0.95 01/22/2016   GLUCOSE 131 (H) 01/22/2016   ALT 22 01/22/2016    Lab Results  Component Value Date   CHOL 167 01/22/2016   HDL 59 01/22/2016   LDLCALC 84 01/22/2016   TRIG 118 01/22/2016   CHOLHDL 2.8 01/22/2016    --------------------------------------------------------------------------------------------------  ASSESSMENT AND PLAN: Syncope No further episodes since her last visit in October. Extensive cardiac workup has been without a clear etiology. I suspect her syncope was most likely vasovagal in etiology. I counseled the patient on the nature of vasovagal syncope the importance of staying well-hydrated and sitting/lying down if she begins to feel dizzy. We will not perform further workup at this time.  Hypertension Blood pressure at upper [place] of normal, given history of diabetes. We will not make any medication changes today.  Leg swelling No significant edema today.  Patient can continue with as needed furosemide.  She should also consider wearing compression stockings.  Follow-up: Return to clinic in 6 months.  Nelva Bush, MD 04/02/2016 11:06 AM

## 2016-04-02 NOTE — Patient Instructions (Signed)

## 2016-04-03 ENCOUNTER — Encounter: Payer: Self-pay | Admitting: Internal Medicine

## 2016-04-29 ENCOUNTER — Encounter: Payer: Self-pay | Admitting: Family Medicine

## 2016-04-29 ENCOUNTER — Ambulatory Visit (INDEPENDENT_AMBULATORY_CARE_PROVIDER_SITE_OTHER): Payer: Medicare Other | Admitting: Family Medicine

## 2016-04-29 VITALS — BP 122/78 | HR 67 | Temp 97.5°F | Resp 14 | Wt 181.2 lb

## 2016-04-29 DIAGNOSIS — I10 Essential (primary) hypertension: Secondary | ICD-10-CM

## 2016-04-29 DIAGNOSIS — E7800 Pure hypercholesterolemia, unspecified: Secondary | ICD-10-CM | POA: Diagnosis not present

## 2016-04-29 DIAGNOSIS — E1100 Type 2 diabetes mellitus with hyperosmolarity without nonketotic hyperglycemic-hyperosmolar coma (NKHHC): Secondary | ICD-10-CM

## 2016-04-29 NOTE — Progress Notes (Signed)
[name]    DOB: [birth year]   83 y.o.   MRN: [id number] Visit Date: 04/29/2016  Today's Provider: [name], PA   Chief Complaint  Patient presents with  . Hypertension  . Hyperlipidemia  . Diabetes  . Follow-up   Subjective:    HPI  Diabetes Mellitus Type II, Follow-up:   Lab Results  Component Value Date   HGBA1C 7.1 (H) 01/22/2016   HGBA1C 8.5 10/23/2015   HGBA1C 9.1 08/15/2015   Last seen for diabetes 3 months ago.  Management since then includes continue Metformin with Glipizide and try to follow diet. She reports good compliance with treatment. She is not having side effects.  Current symptoms include none  Home blood sugar records: fasting range: 125-132  Episodes of hypoglycemia? no   Current Insulin Regimen: none Weight trend: stable Current diet: well balanced Current exercise: no regular exercise minimal  ------------------------------------------------------------------------   Hypertension, follow-up:  BP Readings from Last 3 Encounters:  04/29/16 122/78  04/02/16 130/70  01/21/16 108/64    She was last seen for hypertension 3 months ago.  BP at that visit was 108/64. Management since that visit includes continue medications.She reports good compliance with treatment. She is not having side effects.  She is exercising minimal. She is adherent to low salt diet.   Outside blood pressures are not being checked. She is experiencing none.  Patient denies chest pain, chest pressure/discomfort, irregular heart beat and palpitations.   Cardiovascular risk factors include advanced age (older than 88 for men, 73 for women), diabetes mellitus, dyslipidemia and obesity (BMI >= 30 kg/m2).  Use of agents associated with hypertension: none.   ------------------------------------------------------------------------    Lipid/Cholesterol, Follow-up:   Last seen for this 3 months ago.  Management since that visit includes  continue medications.  Last Lipid Panel:    Component Value Date/Time   CHOL 167 01/22/2016 0846   TRIG 118 01/22/2016 0846   HDL 59 01/22/2016 0846   CHOLHDL 2.8 01/22/2016 0846   LDLCALC 84 01/22/2016 0846    She reports good compliance with treatment. She is not having side effects.   Wt Readings from Last 3 Encounters:  04/29/16 181 lb 3.2 oz (82.2 kg)  04/02/16 178 lb (80.7 kg)  01/21/16 174 lb 12.8 oz (79.3 kg)    ------------------------------------------------------------------------ Past Medical History:  Diagnosis Date  . Diabetes mellitus without complication ([place])   . Diverticulitis   . History of echocardiogram    a. 11/2015: echo showing EF of 60-65% with no WMA. Grade 1 DD and mild MR noted.   . Hypertension   . Syncope    a. initially occurring in Fall 2016 b. Hospitalized in 10/2015 and 11/2015 for recurrent episodes.    Past Surgical History:  Procedure Laterality Date  . ABDOMINAL HYSTERECTOMY    . BACK SURGERY  08/08/2008   Lumbar discectomy  . NECK SURGERY  1980   Family History  Problem Relation Age of Onset  . Hypertension Mother   . Diabetes Mother   . Lung cancer Father    Allergies  Allergen Reactions  . Morphine Sulfate Other (See Comments)    BP bottoms out  . Penicillins Diarrhea    Has patient had a PCN reaction causing immediate rash, facial/tongue/throat swelling, SOB or lightheadedness with hypotension: Yes Has patient had a PCN reaction causing severe rash involving mucus membranes or skin necrosis: Yes Has patient had a PCN reaction that required hospitalization No Has patient had  a PCN reaction occurring within the last 10 years: No If all of the above answers are "NO", then may proceed with Cephalosporin use.     Previous Medications   ASPIRIN 81 MG TABLET    Take 81 mg by mouth daily.    BUDESONIDE (ENTOCORT EC) 3 MG 24 HR CAPSULE    TAKE 3  CAPSULES  ORAL, DAILY   CHOLECALCIFEROL (VITAMIN D) 2000 UNITS CAPS    Take 1  capsule by mouth daily.    FUROSEMIDE (LASIX) 20 MG TABLET    TAKE ONE TABLET BY MOUTH DAILY   GLIPIZIDE (GLUCOTROL XL) 2.5 MG 24 HR TABLET    Take 1 tablet (2.5 mg total) by mouth daily with breakfast.   LISINOPRIL (PRINIVIL,ZESTRIL) 10 MG TABLET    Take 1 tablet (10 mg total) by mouth daily.   METFORMIN (GLUCOPHAGE) 500 MG TABLET    Take 1 tablet (500 mg total) by mouth 2 (two) times daily with a meal.   MULTIPLE VITAMIN TABLET    Take 1 tablet by mouth every evening. Reported on 03/13/2015   SIMVASTATIN (ZOCOR) 20 MG TABLET    TAKE 1 TABLET BY MOUTH AT BEDTIME    Review of Systems  Constitutional: Negative.   Respiratory: Negative.   Cardiovascular: Negative.   Endocrine: Negative.   Musculoskeletal: Negative.     Social History  Substance Use Topics  . Smoking status: Former Smoker    Quit date: 03/09/1978  . Smokeless tobacco: Never Used  . Alcohol use 0.6 oz/week    1 Glasses of wine per week     Comment: none since hospitalization   Objective:   BP 122/78 (BP Location: Right Arm, Patient Position: Sitting, Cuff Size: Normal)   Pulse 67   Temp 97.5 F (36.4 C) (Oral)   Resp 14   Wt 181 lb 3.2 oz (82.2 kg)   SpO2 99%   BMI 33.68 kg/m   Physical Exam  Constitutional: She is oriented to person, place, and time. She appears well-developed and well-nourished. No distress.  HENT:  Head: Normocephalic and atraumatic.  Right Ear: Hearing normal.  Left Ear: Hearing normal.  Nose: Nose normal.  Eyes: Conjunctivae and lids are normal. Right eye exhibits no discharge. Left eye exhibits no discharge. No scleral icterus.  Neck: Neck supple.  Cardiovascular: Normal rate and regular rhythm.   Pulmonary/Chest: Effort normal and breath sounds normal. No respiratory distress.  Abdominal: Soft. Bowel sounds are normal.  Musculoskeletal:  Corn left lateral 5th toe and callus on the tip of the right 2nd toe with tenderness to each lesion. Questionable decrease in sensation dorsum of  each forefoot. No plantar calluses.   Neurological: She is alert and oriented to person, place, and time.  Skin: Skin is intact. No lesion and no rash noted.  Psychiatric: She has a normal mood and affect. Her speech is normal and behavior is normal. Thought content normal.      Assessment & Plan:     1. Type 2 diabetes mellitus with hyperosmolarity without coma, without long-term current use of insulin (HCC) Good glucose control with FBS ave. 125-132. Continues to have a right 2nd toe callus and corn on the left 5th toe that will be followed by Dr. Milinda Pointer (podiatrist) in 3 weeks. Questionable decrease sensation in forefoot bilaterally that he will further assess. Continues to have eye exam annually (last exam 02-14-16). Continue Metformin and Glipizide daily. Recheck labs and follow up pending reports. - CBC with Differential/Platelet -  Comprehensive metabolic panel - Hemoglobin A1c - Lipid panel  2. Essential (primary) hypertension Good BP control. Tolerating Lisinopril 10 mg qd and occasionally uses Lasix 20 mg prn edema. Recheck CBC, CMP, Lipids and TSH. Follow up pending reports. - CBC with Differential/Platelet - Comprehensive metabolic panel - Lipid panel - TSH  3. Hypercholesteremia Continues to work on low fat diet and tolerating simvastatin 20 mg qd without side effects. Recheck routine labs. - Comprehensive metabolic panel - Lipid panel - TSH

## 2016-05-01 ENCOUNTER — Telehealth: Payer: Self-pay

## 2016-05-01 DIAGNOSIS — E119 Type 2 diabetes mellitus without complications: Secondary | ICD-10-CM

## 2016-05-01 LAB — COMPREHENSIVE METABOLIC PANEL
A/G RATIO: 2 (ref 1.2–2.2)
ALK PHOS: 48 IU/L (ref 39–117)
ALT: 22 IU/L (ref 0–32)
AST: 15 IU/L (ref 0–40)
Albumin: 3.9 g/dL (ref 3.5–4.7)
BILIRUBIN TOTAL: 0.6 mg/dL (ref 0.0–1.2)
BUN/Creatinine Ratio: 29 — ABNORMAL HIGH (ref 12–28)
BUN: 27 mg/dL (ref 8–27)
CALCIUM: 9.1 mg/dL (ref 8.7–10.3)
CHLORIDE: 105 mmol/L (ref 96–106)
CO2: 26 mmol/L (ref 18–29)
Creatinine, Ser: 0.94 mg/dL (ref 0.57–1.00)
GFR calc Af Amer: 65 (ref >59)
GFR, EST NON AFRICAN AMERICAN: 57 — AB (ref >59)
GLOBULIN, TOTAL: 2 (ref 1.5–4.5)
Glucose: 141 mg/dL — ABNORMAL HIGH (ref 65–99)
POTASSIUM: 4.4 mmol/L (ref 3.5–5.2)
SODIUM: 146 mmol/L — AB (ref 134–144)
Total Protein: 5.9 g/dL — ABNORMAL LOW (ref 6.0–8.5)

## 2016-05-01 LAB — CBC WITH DIFFERENTIAL/PLATELET
BASOS: 0 %
Basophils Absolute: 0 10*3/uL (ref 0.0–0.2)
EOS (ABSOLUTE): 0.2 10*3/uL (ref 0.0–0.4)
EOS: 2 %
HEMATOCRIT: 39 % (ref 34.0–46.6)
Hemoglobin: 13 g/dL (ref 11.1–15.9)
Immature Grans (Abs): 0.1 10*3/uL (ref 0.0–0.1)
Immature Granulocytes: 1 %
LYMPHS ABS: 4.6 10*3/uL — AB (ref 0.7–3.1)
Lymphs: 54 %
MCH: 29.8 pg (ref 26.6–33.0)
MCHC: 33.3 g/dL (ref 31.5–35.7)
MCV: 89 fL (ref 79–97)
MONOS ABS: 0.8 10*3/uL (ref 0.1–0.9)
Monocytes: 9 %
NEUTROS ABS: 2.8 10*3/uL (ref 1.4–7.0)
NEUTROS PCT: 34 %
PLATELETS: 265 10*3/uL (ref 150–379)
RBC: 4.36 x10E6/uL (ref 3.77–5.28)
RDW: 13.4 % (ref 12.3–15.4)
WBC: 8.5 10*3/uL (ref 3.4–10.8)

## 2016-05-01 LAB — LIPID PANEL
Chol/HDL Ratio: 3 (ref 0.0–4.4)
Cholesterol, Total: 161 mg/dL (ref 100–199)
HDL: 54 mg/dL (ref >39)
LDL Calculated: 66 (ref 0–99)
Triglycerides: 207 mg/dL — ABNORMAL HIGH (ref 0–149)
VLDL CHOLESTEROL CAL: 41 — AB (ref 5–40)

## 2016-05-01 LAB — TSH: TSH: 2.99 u[IU]/mL (ref 0.450–4.500)

## 2016-05-01 LAB — HEMOGLOBIN A1C
ESTIMATED AVERAGE GLUCOSE: 183
Hgb A1c MFr Bld: 8 % — ABNORMAL HIGH (ref 4.8–5.6)

## 2016-05-01 MED ORDER — METFORMIN HCL 500 MG PO TABS: ORAL_TABLET | ORAL | 12 refills | Status: DC

## 2016-05-01 NOTE — Telephone Encounter (Signed)
-----   Message from Margo Common, [place] sent at 05/01/2016 12:58 PM EST ----- Blood sugar, triglycerides and Hgb A1C higher. Sodium a little elevated. Increasing water intake and increase Metformin 500 mg to one each morning and two each evening should help. Watch diet and recheck next month as planned.

## 2016-05-19 ENCOUNTER — Other Ambulatory Visit: Payer: Self-pay | Admitting: Family Medicine

## 2016-05-19 ENCOUNTER — Telehealth: Payer: Self-pay

## 2016-05-19 DIAGNOSIS — E1100 Type 2 diabetes mellitus with hyperosmolarity without nonketotic hyperglycemic-hyperosmolar coma (NKHHC): Secondary | ICD-10-CM

## 2016-05-19 DIAGNOSIS — E7800 Pure hypercholesterolemia, unspecified: Secondary | ICD-10-CM

## 2016-05-19 MED ORDER — SIMVASTATIN 20 MG PO TABS: 20.0000 mg | ORAL_TABLET | Freq: Every day | ORAL | 3 refills | Status: DC

## 2016-05-19 NOTE — Telephone Encounter (Signed)
Done

## 2016-05-19 NOTE — Telephone Encounter (Signed)
Refill request received from [facility] requesting simvastatin (ZOCOR) 20 MG tablet

## 2016-05-21 ENCOUNTER — Encounter: Payer: Self-pay | Admitting: Podiatry

## 2016-05-21 ENCOUNTER — Ambulatory Visit (INDEPENDENT_AMBULATORY_CARE_PROVIDER_SITE_OTHER): Payer: Medicare Other | Admitting: Podiatry

## 2016-05-21 DIAGNOSIS — Q828 Other specified congenital malformations of skin: Secondary | ICD-10-CM | POA: Diagnosis not present

## 2016-05-21 DIAGNOSIS — E119 Type 2 diabetes mellitus without complications: Secondary | ICD-10-CM

## 2016-05-21 DIAGNOSIS — M7752 Other enthesopathy of left foot: Secondary | ICD-10-CM

## 2016-05-21 DIAGNOSIS — M779 Enthesopathy, unspecified: Secondary | ICD-10-CM

## 2016-05-21 DIAGNOSIS — M778 Other enthesopathies, not elsewhere classified: Secondary | ICD-10-CM

## 2016-05-21 MED ORDER — PREGABALIN 50 MG PO CAPS: 50.0000 mg | ORAL_CAPSULE | Freq: Two times a day (BID) | ORAL | 3 refills | Status: DC

## 2016-05-21 NOTE — Progress Notes (Signed)
She presents today with chief complaint of painful porokeratosis distal clavus second digit right dorsal lateral aspect of the fifth PIPJ left.  Objective: Vital signs are stable alert and oriented 3. Pulses are palpable. Distal clavus second digit right foot with hammertoe deformity, tender to palpation no signs of ulceration. She does have a bogginess underneath the PIPJ and the reactive hyperkeratotic tissue overlying the fifth toe.  Assessment: Bursitis capsulitis fifth digit left foot. Debrided all reactive hyperkeratotic tissue. Also for keratoma second digit right foot.  Plan: Debrided all reactive hyperkeratotic tissue after injection sublesionally at the level of the PIPJ Kenalog and dexamethasone local anesthetic.

## 2016-05-23 ENCOUNTER — Other Ambulatory Visit: Payer: Self-pay | Admitting: Gastroenterology

## 2016-06-25 ENCOUNTER — Ambulatory Visit (INDEPENDENT_AMBULATORY_CARE_PROVIDER_SITE_OTHER): Payer: Medicare Other | Admitting: Podiatry

## 2016-06-25 DIAGNOSIS — M7752 Other enthesopathy of left foot: Secondary | ICD-10-CM | POA: Diagnosis not present

## 2016-06-25 DIAGNOSIS — Q828 Other specified congenital malformations of skin: Secondary | ICD-10-CM | POA: Diagnosis not present

## 2016-06-25 DIAGNOSIS — M779 Enthesopathy, unspecified: Principal | ICD-10-CM

## 2016-06-25 DIAGNOSIS — M778 Other enthesopathies, not elsewhere classified: Secondary | ICD-10-CM

## 2016-06-25 NOTE — Progress Notes (Signed)
She presents today with chief complaint of a painful corn to the fifth digit of the left foot and a crack in her heel right.  Objective: Vital signs are stable she is alert and oriented 3. The crack to the posterior heel is superficial does not probe deep does not demonstrate erythema cellulitis drainage or odor. It is mildly tender on palpation. Fifth toe left foot does demonstrate a area of bursitis overlying the PIPJ fifth digit left foot with an overlying reactive hyperkeratotic lesion. Hammertoe deformity is [place].  Assessment: Hammertoe deformity bursitis capsulitis fifth digit left foot. Heel fissure right foot.  Plan: Discussed etiology pathology conservative versus surgical therapies. Dispensed O'Keefe's cream. Also injected the fifth toe with dexamethasone and local anesthetic debrided all reactive hyperkeratotic lesions. Remembers asked how her trip to [place] went.

## 2016-07-21 ENCOUNTER — Ambulatory Visit (INDEPENDENT_AMBULATORY_CARE_PROVIDER_SITE_OTHER): Payer: Medicare Other | Admitting: Family Medicine

## 2016-07-21 ENCOUNTER — Encounter: Payer: Self-pay | Admitting: Family Medicine

## 2016-07-21 VITALS — BP 130/58 | HR 96 | Temp 98.3°F | Resp 16 | Wt 186.0 lb

## 2016-07-21 DIAGNOSIS — I10 Essential (primary) hypertension: Secondary | ICD-10-CM | POA: Diagnosis not present

## 2016-07-21 DIAGNOSIS — R609 Edema, unspecified: Secondary | ICD-10-CM | POA: Diagnosis not present

## 2016-07-21 DIAGNOSIS — E1100 Type 2 diabetes mellitus with hyperosmolarity without nonketotic hyperglycemic-hyperosmolar coma (NKHHC): Secondary | ICD-10-CM

## 2016-07-21 DIAGNOSIS — E7800 Pure hypercholesterolemia, unspecified: Secondary | ICD-10-CM | POA: Diagnosis not present

## 2016-07-21 MED ORDER — FUROSEMIDE 20 MG PO TABS: 20.0000 mg | ORAL_TABLET | Freq: Every day | ORAL | 2 refills | Status: DC | PRN

## 2016-07-21 NOTE — Progress Notes (Signed)
[name]    DOB: 11-Jun-1931   83 y.o.   MRN: [id number] Visit Date: 07/21/2016  Today's Provider: [name], PA   Chief Complaint  Patient presents with  . Diabetes  . Hypertension  . Hyperlipidemia   Subjective:    HPI  Diabetes Mellitus Type II, Follow-up:   Lab Results  Component Value Date   HGBA1C 8.0 (H) 04/30/2016   HGBA1C 7.1 (H) 01/22/2016   HGBA1C 8.5 10/23/2015    Last seen for diabetes 3 months ago.  Management since then includes increasing metformin to 2 tablets daily. However, patient reports that she did not increase her medication. She reports fair compliance with treatment. She is not having side effects.  Current symptoms include swelling in lower legs and have been worsening. Home blood sugar records: trend: fluctuating a bit  Episodes of hypoglycemia? no   Current Insulin Regimen: none Most Recent Eye Exam: due Weight trend: fluctuating a bit Prior visit with dietician: no Current diet: well balanced Current exercise: none  Pertinent Labs:    Component Value Date/Time   CHOL 161 04/30/2016 0903   TRIG 207 (H) 04/30/2016 0903   HDL 54 04/30/2016 0903   LDLCALC 66 04/30/2016 0903   CREATININE 0.94 04/30/2016 0903    Wt Readings from Last 3 Encounters:  07/21/16 186 lb (84.4 kg)  04/29/16 181 lb 3.2 oz (82.2 kg)  04/02/16 178 lb (80.7 kg)      Lipid/Cholesterol, Follow-up:   Last seen for this3 months ago.  Management changes since that visit include increase exercise and watch diet. . Last Lipid Panel:    Component Value Date/Time   CHOL 161 04/30/2016 0903   TRIG 207 (H) 04/30/2016 0903   HDL 54 04/30/2016 0903   CHOLHDL 3.0 04/30/2016 0903   LDLCALC 66 04/30/2016 0903    Risk factors for vascular disease include diabetes mellitus  She reports fair compliance with treatment. She is having side effects.  Current symptoms include swelling in lower legs and have been worsening. Weight  trend: fluctuating a bit Prior visit with dietician: no Current diet: well balanced Current exercise: none  Wt Readings from Last 3 Encounters:  07/21/16 186 lb (84.4 kg)  04/29/16 181 lb 3.2 oz (82.2 kg)  04/02/16 178 lb (80.7 kg)    Patient Active Problem List   Diagnosis Date Noted  . Seizure ([place])   . Faintness   . Syncope 11/21/2015  . ARF (acute renal failure) ([place]) 10/26/2015  . Laceration of right lower leg 09/28/2015  . Senile purpura ([place]) 01/01/2015  . Edema 08/15/2014  . Arthritis 07/12/2014  . Basal cell carcinoma 07/12/2014  . CC (collagenous colitis) 07/12/2014  . Essential (primary) hypertension 07/12/2014  . Hypercholesteremia 07/12/2014  . Adiposity 07/12/2014  . Acne erythematosa 07/12/2014  . Diabetes mellitus, type 2 ([place]) 07/12/2014  . 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 Avitaminosis D 07/12/2014  . H/O malignant neoplasm of skin 10/10/2011   Past Surgical History:  Procedure Laterality Date  . ABDOMINAL HYSTERECTOMY    . BACK SURGERY  08/08/2008   Lumbar discectomy  . NECK SURGERY  1980   Family History  Problem Relation Age of Onset  . Hypertension Mother   . Diabetes Mother   . Lung cancer Father    Allergies  Allergen Reactions  . Morphine Sulfate Other (See Comments)    BP bottoms out  . Penicillins Diarrhea    Has patient had a PCN reaction  causing immediate rash, facial/tongue/throat swelling, SOB or lightheadedness with hypotension: Yes Has patient had a PCN reaction causing severe rash involving mucus membranes or skin necrosis: Yes Has patient had a PCN reaction that required hospitalization No Has patient had a PCN reaction occurring within the last 10 years: No If all of the above answers are "NO", then may proceed with Cephalosporin use.    Current Outpatient Prescriptions:  .  aspirin 81 MG tablet, Take 81 mg by mouth daily. , Disp: , Rfl:  .  budesonide (ENTOCORT EC) 3 MG 24 hr capsule, TAKE THREE CAPSULES BY MOUTH DAILY, Disp: 90  capsule, Rfl: 2 .  Cholecalciferol (VITAMIN D) 2000 UNITS CAPS, Take 1 capsule by mouth daily. , Disp: , Rfl:  .  furosemide (LASIX) 20 MG tablet, TAKE ONE TABLET BY MOUTH DAILY (Patient taking differently: TAKE ONE TABLET BY MOUTH DAILY AS NEEDED.), Disp: 20 tablet, Rfl: 0 .  glipiZIDE (GLUCOTROL XL) 2.5 MG 24 hr tablet, Take 1 tablet (2.5 mg total) by mouth daily with breakfast., Disp: 90 tablet, Rfl: 3 .  lisinopril (PRINIVIL,ZESTRIL) 10 MG tablet, Take 1 tablet (10 mg total) by mouth daily., Disp: 90 tablet, Rfl: 3 .  metFORMIN (GLUCOPHAGE) 500 MG tablet, 1 tablet in the morning and 2 tablets in the evening, Disp: 90 tablet, Rfl: 12 .  Multiple Vitamin tablet, Take 1 tablet by mouth every evening. Reported on 03/13/2015, Disp: , Rfl:  .  pregabalin (LYRICA) 50 MG capsule, Take 1 capsule (50 mg total) by mouth 2 (two) times daily., Disp: 60 capsule, Rfl: 3 .  simvastatin (ZOCOR) 20 MG tablet, Take 1 tablet (20 mg total) by mouth at bedtime., Disp: 90 tablet, Rfl: 3  Review of Systems  Constitutional: Negative.   Cardiovascular: Positive for leg swelling. Negative for chest pain and palpitations.  Musculoskeletal: Positive for arthralgias and joint swelling.  Neurological: Negative for dizziness, weakness, numbness and headaches.    Social History  Substance Use Topics  . Smoking status: Former Smoker    Quit date: 03/09/1978  . Smokeless tobacco: Never Used  . Alcohol use 0.6 oz/week    1 Glasses of wine per week     Comment: none since hospitalization   Objective:   BP (!) 130/58 (BP Location: Right Arm, Patient Position: Sitting, Cuff Size: Normal)   Pulse 96   Temp 98.3 F (36.8 C)   Resp 16   Wt 186 lb (84.4 kg)   SpO2 95%   BMI 34.58 kg/m  Vitals:   07/21/16 1327  BP: (!) 130/58  Pulse: 96  Resp: 16  Temp: 98.3 F (36.8 C)  SpO2: 95%  Weight: 186 lb (84.4 kg)     Physical Exam  Constitutional: She is oriented to person, place, and time. She appears  well-developed and well-nourished. No distress.  HENT:  Head: Normocephalic and atraumatic.  Right Ear: Hearing normal.  Left Ear: Hearing normal.  Nose: Nose normal.  Eyes: Conjunctivae and lids are normal. Right eye exhibits no discharge. Left eye exhibits no discharge. No scleral icterus.  Neck: Neck supple.  Cardiovascular: Normal rate and regular rhythm.   Pulmonary/Chest: Effort normal and breath sounds normal. No respiratory distress.  Abdominal: Soft. Bowel sounds are normal.  Musculoskeletal: She exhibits edema.  Callus and deflated blister on the left 5th toe. No lymphangitis. 3+ pitting edema of both lower legs and feet.  Lymphadenopathy:    She has no cervical adenopathy.  Neurological: She is alert and oriented to person,  place, and time.  Skin: Skin is intact. No lesion and no rash noted.  Psychiatric: She has a normal mood and affect. Her speech is normal and behavior is normal. Thought content normal.      Assessment & Plan:     1. Type 2 diabetes mellitus with hyperosmolarity without coma, without long-term current use of insulin ([place]) Did not increase Metformin when Hgb A1C was 8.0 in Feb. 2018. Still taking Metformin 500 mg BID and Glipizide 2.5 mg qd. States the FBS is around 140 at home (had a 250 one evening after her trip). Must get back on there diet and checking FBS daily. May need to consider increase in Glipizide since she gets too much diarrhea when taking more than 1000 mg of Metformin. Recheck labs and follow up pending reports. - CBC with Differential/Platelet - Comprehensive metabolic panel - Lipid panel - Hemoglobin A1c  2. Essential (primary) hypertension Tolerating Lisinopril 10 mg qd. Restrict sodium, caffeine and ETOH (had more during her trip). Recheck CBC, CMP and Lipid Panel.  - CBC with Differential/Platelet - Comprehensive metabolic panel - Lipid panel  3. Hypercholesteremia Tolerating Simvastatin 20 mg qd and has gained 5 lbs since last  visit in Feb. 2018. Recheck labs and continue to work on a low fat diet. - Comprehensive metabolic panel - Lipid panel  4. Peripheral edema Recurrence after trip to [place] the past weekend. Will check labs and refill Lasix (forgot to take this with her on the trip). Encouraged to restrict sodium in diet. Recheck pending lab reports. - furosemide (LASIX) 20 MG tablet; Take 1 tablet (20 mg total) by mouth daily as needed.  Dispense: 30 tablet; Refill: 2 - CBC with Differential/Platelet - Comprehensive metabolic panel       [name], PA  [facility]

## 2016-07-24 LAB — CBC WITH DIFFERENTIAL/PLATELET
BASOS ABS: 0 10*3/uL (ref 0.0–0.2)
Basos: 0 %
EOS (ABSOLUTE): 0.2 10*3/uL (ref 0.0–0.4)
EOS: 3 %
HEMATOCRIT: 38.8 % (ref 34.0–46.6)
Hemoglobin: 12.9 g/dL (ref 11.1–15.9)
Immature Grans (Abs): 0.1 10*3/uL (ref 0.0–0.1)
Immature Granulocytes: 1 %
LYMPHS ABS: 4.4 10*3/uL — AB (ref 0.7–3.1)
Lymphs: 47 %
MCH: 30.2 pg (ref 26.6–33.0)
MCHC: 33.2 g/dL (ref 31.5–35.7)
MCV: 91 fL (ref 79–97)
MONOS ABS: 1 10*3/uL — AB (ref 0.1–0.9)
Monocytes: 11 %
NEUTROS ABS: 3.5 10*3/uL (ref 1.4–7.0)
Neutrophils: 38 %
Platelets: 260 10*3/uL (ref 150–379)
RBC: 4.27 x10E6/uL (ref 3.77–5.28)
RDW: 13.1 % (ref 12.3–15.4)
WBC: 9.3 10*3/uL (ref 3.4–10.8)

## 2016-07-24 LAB — COMPREHENSIVE METABOLIC PANEL
ALBUMIN: 3.7 g/dL (ref 3.5–4.7)
ALK PHOS: 51 IU/L (ref 39–117)
ALT: 23 IU/L (ref 0–32)
AST: 14 IU/L (ref 0–40)
Albumin/Globulin Ratio: 1.5 (ref 1.2–2.2)
BILIRUBIN TOTAL: 0.5 mg/dL (ref 0.0–1.2)
BUN / CREAT RATIO: 23 (ref 12–28)
BUN: 22 mg/dL (ref 8–27)
CHLORIDE: 102 mmol/L (ref 96–106)
CO2: 29 mmol/L (ref 18–29)
CREATININE: 0.94 mg/dL (ref 0.57–1.00)
Calcium: 9.2 mg/dL (ref 8.7–10.3)
GFR calc Af Amer: 65 mL/min/{1.73_m2} (ref >59)
GFR calc non Af Amer: 56 mL/min/{1.73_m2} — ABNORMAL LOW (ref >59)
GLOBULIN, TOTAL: 2.4 g/dL (ref 1.5–4.5)
Glucose: 139 mg/dL — ABNORMAL HIGH (ref 65–99)
POTASSIUM: 4.6 mmol/L (ref 3.5–5.2)
SODIUM: 143 mmol/L (ref 134–144)
Total Protein: 6.1 g/dL (ref 6.0–8.5)

## 2016-07-24 LAB — LIPID PANEL
CHOLESTEROL TOTAL: 151 mg/dL (ref 100–199)
Chol/HDL Ratio: 2.6 ratio (ref 0.0–4.4)
HDL: 57 mg/dL (ref >39)
LDL CALC: 73 mg/dL (ref 0–99)
TRIGLYCERIDES: 103 mg/dL (ref 0–149)
VLDL Cholesterol Cal: 21 mg/dL (ref 5–40)

## 2016-07-24 LAB — HEMOGLOBIN A1C
ESTIMATED AVERAGE GLUCOSE: 197 mg/dL
HEMOGLOBIN A1C: 8.5 % — AB (ref 4.8–5.6)

## 2016-07-25 ENCOUNTER — Telehealth: Payer: Self-pay

## 2016-07-25 DIAGNOSIS — E119 Type 2 diabetes mellitus without complications: Secondary | ICD-10-CM

## 2016-07-25 MED ORDER — GLIPIZIDE ER 5 MG PO TB24: 5.0000 mg | ORAL_TABLET | Freq: Every day | ORAL | 3 refills | Status: DC

## 2016-07-25 NOTE — Telephone Encounter (Signed)
-----   Message from Margo Common, [place] sent at 07/25/2016  1:35 PM EDT ----- Cholesterol back to normal. Hgb A1C is getting worse. Increase Glipizide 2.5 mg qd to 5 mg qd. Continue Metformin 500 mg BID and recheck progress in 3 months.

## 2016-07-28 ENCOUNTER — Ambulatory Visit: Payer: Medicare Other | Admitting: Family Medicine

## 2016-07-29 ENCOUNTER — Ambulatory Visit (INDEPENDENT_AMBULATORY_CARE_PROVIDER_SITE_OTHER): Payer: Medicare Other | Admitting: Podiatry

## 2016-07-29 ENCOUNTER — Encounter: Payer: Self-pay | Admitting: Podiatry

## 2016-07-29 ENCOUNTER — Ambulatory Visit (INDEPENDENT_AMBULATORY_CARE_PROVIDER_SITE_OTHER): Payer: Medicare Other

## 2016-07-29 DIAGNOSIS — L03032 Cellulitis of left toe: Secondary | ICD-10-CM | POA: Diagnosis not present

## 2016-07-29 DIAGNOSIS — I70245 Atherosclerosis of native arteries of left leg with ulceration of other part of foot: Secondary | ICD-10-CM | POA: Diagnosis not present

## 2016-07-29 DIAGNOSIS — E0842 Diabetes mellitus due to underlying condition with diabetic polyneuropathy: Secondary | ICD-10-CM

## 2016-07-29 DIAGNOSIS — M79675 Pain in left toe(s): Secondary | ICD-10-CM | POA: Diagnosis not present

## 2016-07-29 DIAGNOSIS — L97522 Non-pressure chronic ulcer of other part of left foot with fat layer exposed: Secondary | ICD-10-CM

## 2016-07-29 MED ORDER — SULFAMETHOXAZOLE-TRIMETHOPRIM 800-160 MG PO TABS: 1.0000 | ORAL_TABLET | Freq: Two times a day (BID) | ORAL | 0 refills | Status: DC

## 2016-07-29 MED ORDER — GENTAMICIN SULFATE 0.1 % EX CREA: 1.0000 "application " | TOPICAL_CREAM | Freq: Three times a day (TID) | CUTANEOUS | 0 refills | Status: DC

## 2016-07-29 NOTE — Progress Notes (Signed)
   Subjective:  Patient with a history of diabetes mellitus presents today for evaluation of a blistered to the left fifth toe that appeared 1.5 weeks ago. She states she popped the blister with a needle. She now reports associated redness, swelling and pain. She states she also has a corn on the toe. Patient presents today for further treatment and evaluation   Objective/Physical Exam General: The patient is alert and oriented x3 in no acute distress.  Dermatology:  Wound #1 noted to the left fifth toe measuring 0.3 x 0.3 x 0.3 cm (LxWxD).   To the noted ulceration(s), there is no eschar. There is a moderate amount of slough, fibrin, and necrotic tissue noted. Granulation tissue and wound base is red. There is a minimal amount of serosanguineous drainage noted. There is no exposed bone muscle-tendon ligament or joint. There is no malodor. Periwound integrity is intact. Skin is warm, dry and supple bilateral lower extremities.  Vascular: Erythema with edema to the left fifth toe. Palpable pedal pulses bilaterally. Capillary refill within normal limits.  Neurological: Epicritic and protective threshold absent bilaterally.   Musculoskeletal Exam: Range of motion within normal limits to all pedal and ankle joints bilateral. Muscle strength 5/5 in all groups bilateral.   Radiographic Exam:  Normal osseous mineralization. Joint spaces preserved. No fracture/dislocation/boney destruction.  Assessment: #1 ulcer of the left fifth toe secondary to diabetes mellitus #2 diabetes mellitus w/ peripheral neuropathy #3 cellulitis of the left fifth toe   Plan of Care:  #1 Patient was evaluated. X-rays reviewed. #2 #2 medically necessary excisional debridement including muscle and deep fascial tissue was performed using a tissue nipper and a chisel blade. Excisional debridement of all the necrotic nonviable tissue down to healthy bleeding viable tissue was performed with post-debridement measurements  same as pre-. #3 the wound was cleansed and dry sterile dressing applied. #4 prescription for Bactrim DS given to patient. #5 prescription for gentamicin cream given to patient. #6 culture taken today. #7 return to clinic in 1 week.   Edrick Kins, DPM [facility]  Dr. Edrick Kins, [place]                                        [place], [place] [address]                Office [phone]  Fax [phone]

## 2016-07-30 ENCOUNTER — Telehealth: Payer: Self-pay | Admitting: Internal Medicine

## 2016-07-30 NOTE — Telephone Encounter (Signed)
Daughter not available at the moment. She was with a customer at work. They will give her the message to call back.

## 2016-07-30 NOTE — Telephone Encounter (Signed)
Pt daughter calling stating she is having some swelling again   Pt c/o swelling: STAT is pt has developed SOB within 24 hours  1. How long have you been experiencing swelling? A month   2. Where is the swelling located? Legs   3.  Are you currently taking a "fluid pill"? She is, she did any take them on the trip    4.  Are you currently SOB? She states no but her age may be a factor   5.  Have you traveled recently? went on a trip, she went to [place]. Has been back about two weeks, she stated It back when she got back.  On the trip her legs were oozing but it seems to be better now but was asked to call us  Please advise    Please call daughter back  Work [phone] Cell [phone]

## 2016-07-31 NOTE — Telephone Encounter (Signed)
Received incoming call from patient's daughter. Patient has been having swelling in ankles and feet, slightly swollen in the morning but get worse throughout the day. Reports patient has been back from [place] exactly 2 weeks today. Over the past two weeks, patient has only taken furosemide 20 mg about 8 times max. While in [place] for 7 days, patient did not take furosemide at all. While there, her lower extremity wounds were weeping fluid to the point that the patient had to wear slipper and the slippers were wet. Patient last saw Dr Ellard Artis with podiatry on 07/29/16. Patient reports slight increase in SOB but does not feel like its much worse than normal. Daughter says patient manages her salt well but not her sugar. Patient has been trying to drink more water to "flush out the toxins." However, the daughter is concerned with how much patient should intake of water and fluids with the swelling. Patient does not like taking the furosemide on the days she has places to go. She did not take it yesterday because she had a hair appointment. Daughter says she did take it today. No BP's to report. Denies chest pain or dizziness.   Advised to make sure patient took furosemide today and plan to take furosemide in the morning and await for further advice from Dr End. Patient last saw Dr End in January and was to f/u in 6 months. Appt scheduled for 08/13/16 with Dr End if needed. Routing to Dr End for advice.

## 2016-07-31 NOTE — Telephone Encounter (Signed)
I recommend that [name] take the furosemide 20 mg daily and wear compression stockings during the daytime. If she continues to have significant edema, she can increase the furosemide to 40 mg daily. We are scheduled to follow-up on 08/13/16. If her swelling continues to worsen despite the aforementioned interventions or she develops significant shortness of breath, she should contact us to be seen more urgently. Thanks.  Nelva Bush, MD [facility] Pager: [phone]

## 2016-07-31 NOTE — Telephone Encounter (Signed)
Returned call to patient's daughter. She verbalized understanding of recommendations and instructions of medications and plan of care.

## 2016-08-01 LAB — WOUND CULTURE

## 2016-08-08 ENCOUNTER — Ambulatory Visit (INDEPENDENT_AMBULATORY_CARE_PROVIDER_SITE_OTHER): Payer: Medicare Other | Admitting: Podiatry

## 2016-08-08 DIAGNOSIS — L97522 Non-pressure chronic ulcer of other part of left foot with fat layer exposed: Secondary | ICD-10-CM | POA: Diagnosis not present

## 2016-08-08 DIAGNOSIS — I70245 Atherosclerosis of native arteries of left leg with ulceration of other part of foot: Secondary | ICD-10-CM

## 2016-08-08 DIAGNOSIS — E0842 Diabetes mellitus due to underlying condition with diabetic polyneuropathy: Secondary | ICD-10-CM

## 2016-08-08 MED ORDER — DOXYCYCLINE HYCLATE 100 MG PO TABS: 100.0000 mg | ORAL_TABLET | Freq: Two times a day (BID) | ORAL | 0 refills | Status: DC

## 2016-08-09 NOTE — Progress Notes (Signed)
   Subjective:  Patient with a history of diabetes mellitus presents today for follow-up evaluation of an ulcer to the left fifth toe. She states her condition is unchanged and the lesion is still irritated and painful. She is still taking the prescribed Bactrim.   Objective/Physical Exam General: The patient is alert and oriented x3 in no acute distress.  Dermatology:  Wound #1 noted to the left fifth toe measuring 0.3 x 0.3 x 0.3 cm (LxWxD).   To the noted ulceration(s), there is no eschar. There is a moderate amount of slough, fibrin, and necrotic tissue noted. Granulation tissue and wound base is red. There is a minimal amount of serosanguineous drainage noted. There is no exposed bone muscle-tendon ligament or joint. There is no malodor. Periwound integrity is intact. Skin is warm, dry and supple bilateral lower extremities.  Vascular: Erythema with edema to the left fifth toe. Palpable pedal pulses bilaterally. Capillary refill within normal limits.  Neurological: Epicritic and protective threshold absent bilaterally.   Musculoskeletal Exam: Range of motion within normal limits to all pedal and ankle joints bilateral. Muscle strength 5/5 in all groups bilateral.    Assessment: #1 ulcer of the left fifth toe secondary to diabetes mellitus #2 diabetes mellitus w/ peripheral neuropathy   Plan of Care:  #1 Patient was evaluated.  #2 Medically necessary excisional debridement including muscle and deep fascial tissue was performed using a tissue nipper and a chisel blade. Excisional debridement of all the necrotic nonviable tissue down to healthy bleeding viable tissue was performed with post-debridement measurements same as pre-. #3 the wound was cleansed and dry sterile dressing applied. #4 prior to debridement, 3 mLs of 2% lidocaine utilized. #5 continue topical antibiotics. #6 prescription for doxycycline 100 mg given to patient. #7 return to clinic in 2 weeks.   Edrick Kins, DPM [facility]  Dr. Edrick Kins, [place]                                        [place], [place] [address]                Office [phone]  Fax [phone]

## 2016-08-13 ENCOUNTER — Encounter: Payer: Self-pay | Admitting: Internal Medicine

## 2016-08-13 ENCOUNTER — Ambulatory Visit (INDEPENDENT_AMBULATORY_CARE_PROVIDER_SITE_OTHER): Payer: Medicare Other | Admitting: Internal Medicine

## 2016-08-13 VITALS — BP 136/68 | HR 86 | Ht 62.0 in | Wt 183.8 lb

## 2016-08-13 DIAGNOSIS — I10 Essential (primary) hypertension: Secondary | ICD-10-CM | POA: Diagnosis not present

## 2016-08-13 DIAGNOSIS — R609 Edema, unspecified: Secondary | ICD-10-CM | POA: Diagnosis not present

## 2016-08-13 DIAGNOSIS — I4891 Unspecified atrial fibrillation: Secondary | ICD-10-CM | POA: Insufficient documentation

## 2016-08-13 DIAGNOSIS — Z79899 Other long term (current) drug therapy: Secondary | ICD-10-CM

## 2016-08-13 DIAGNOSIS — R0989 Other specified symptoms and signs involving the circulatory and respiratory systems: Secondary | ICD-10-CM

## 2016-08-13 DIAGNOSIS — S81809A Unspecified open wound, unspecified lower leg, initial encounter: Secondary | ICD-10-CM | POA: Diagnosis not present

## 2016-08-13 DIAGNOSIS — I480 Paroxysmal atrial fibrillation: Secondary | ICD-10-CM | POA: Insufficient documentation

## 2016-08-13 MED ORDER — APIXABAN 5 MG PO TABS: 5.0000 mg | ORAL_TABLET | Freq: Two times a day (BID) | ORAL | 3 refills | Status: DC

## 2016-08-13 MED ORDER — METOPROLOL SUCCINATE ER 25 MG PO TB24: 12.5000 mg | ORAL_TABLET | Freq: Every day | ORAL | 3 refills | Status: DC

## 2016-08-13 NOTE — Progress Notes (Signed)
Follow-up Outpatient Visit Date: 08/13/2016  Chief Complaint: Leg swelling  HPI:  [name] is a 83 y.o. year-old female with history of type 2 diabetes mellitus and hypertension, who presents for evaluation of leg swelling. I last saw her on 04/02/16 for follow-up of 3 preceding syncopal episodes. Workup of her syncope was negative, and symptoms were ultimately attributed to vasovagal episodes.  The patient reports that she developed bilateral lower extremity edema about a month ago, shortly before traveling to [place]. While there, she had worsening leg edema with resultant weeping of both legs. She was unable to wear her shoes on the flight home. Of note, she had previously been taking furosemide 20 mg daily as needed for edema and was using this about once a month. She did not take it with her on her trip to [place]. She said, restarted furosemide 20 mg on a daily basis and has also been using compression stockings. She notes that her edema has resolved. She has noted some fatigue and exertional dyspnea over the last 1-2 months. She denies chest pain, palpitations, lightheadedness, orthopnea, and PND. She has not had any more syncopal or near syncopal episodes since our last visit.  The patient also notes a slow to heal wound along the left fifth toe. She has been seeing a podiatrist for several months, but notes that it continues to be painful at time and has not completely resolved. She denies claudication.  --------------------------------------------------------------------------------------------------  Cardiovascular History & Procedures: Cardiovascular Problems:  Syncope  Risk Factors:  Diabetes mellitus, hypertension, and age  Cath/PCI:  None.  CV Surgery:  None.  EP Procedures and Devices:  30-day event monitor (12/2015): Predominant rhythm was sinus with an average rate 60 bpm (range 45-134 bpm). Occasional PACs and PVCs were noted. There were 2 episodes of  superventricular tachycardia lasting up to 14 beats with a maximum rate of 136 bpm. No sustained arrhythmias were identified. Symptoms of feeling "tired and fatigued" we'll corresponded to sinus rhythm and sinus rhythm with PACs.  Non-Invasive Evaluation(s):  Pharmacologic myocardial perfusion stress test (01/08/16): Low risk study with small apical anterior and apical defect more pronounced on the rest images consistent with shifting breast attenuation. No ischemia identified. Normal LVEF (55-65%).  Transthoracic echocardiogram (11/21/15): Normal LV size and function (EF 60-65%) with grade 1 diastolic dysfunction. Mild MR. Normal RV size and function. Normal pulmonary artery pressure.  Carotid Doppler (11/22/15): No hemodynamically significant plaque or stenosis in either cervical carotid artery.  Recent CV Pertinent Labs: Lab Results  Component Value Date   CHOL 151 07/23/2016   HDL 57 07/23/2016   LDLCALC 73 07/23/2016   TRIG 103 07/23/2016   CHOLHDL 2.6 07/23/2016   K 4.6 07/23/2016   BUN 22 07/23/2016   CREATININE 0.94 07/23/2016    Past medical and surgical history were reviewed and updated in EPIC.   Outpatient Encounter Prescriptions as of 08/13/2016  Medication Sig  . aspirin 81 MG tablet Take 81 mg by mouth daily.   . budesonide (ENTOCORT EC) 3 MG 24 hr capsule TAKE THREE CAPSULES BY MOUTH DAILY  . Cholecalciferol (VITAMIN D) 2000 UNITS CAPS Take 1 capsule by mouth daily.   Marland Kitchen doxycycline (VIBRA-TABS) 100 MG tablet Take 1 tablet (100 mg total) by mouth 2 (two) times daily.  . furosemide (LASIX) 20 MG tablet Take 1 tablet (20 mg total) by mouth daily as needed.  Marland Kitchen gentamicin cream (GARAMYCIN) 0.1 % Apply 1 application topically 3 (three) times daily.  Marland Kitchen glipiZIDE (  GLUCOTROL XL) 5 MG 24 hr tablet Take 1 tablet (5 mg total) by mouth daily with breakfast.  . lisinopril (PRINIVIL,ZESTRIL) 10 MG tablet Take 1 tablet (10 mg total) by mouth daily.  . metFORMIN (GLUCOPHAGE) 500 MG  tablet 1 tablet in the morning and 2 tablets in the evening  . Multiple Vitamin tablet Take 1 tablet by mouth every evening. Reported on 03/13/2015  . pregabalin (LYRICA) 50 MG capsule Take 1 capsule (50 mg total) by mouth 2 (two) times daily.  . simvastatin (ZOCOR) 20 MG tablet Take 1 tablet (20 mg total) by mouth at bedtime.  . sulfamethoxazole-trimethoprim (BACTRIM DS,SEPTRA DS) 800-160 MG tablet Take 1 tablet by mouth 2 (two) times daily.  . [DISCONTINUED] doxycycline (VIBRA-TABS) 100 MG tablet Take 1 tablet (100 mg total) by mouth 2 (two) times daily. (Patient not taking: Reported on 08/13/2016)   No facility-administered encounter medications on file as of 08/13/2016.     Allergies: Morphine sulfate and Penicillins  Social History   Social History  . Marital status: Widowed    Spouse name: N/A  . Number of children: N/A  . Years of education: N/A   Occupational History  . Not on file.   Social History Main Topics  . Smoking status: Former Smoker    Quit date: 03/09/1978  . Smokeless tobacco: Never Used  . Alcohol use 0.6 oz/week    1 Glasses of wine per week     Comment: none since hospitalization  . Drug use: No  . Sexual activity: Not on file   Other Topics Concern  . Not on file   Social History Narrative  . No narrative on file    Family History  Problem Relation Age of Onset  . Hypertension Mother   . Diabetes Mother   . Lung cancer Father     Review of Systems: A 12-system review of systems was performed and was negative except as noted in the HPI.  --------------------------------------------------------------------------------------------------  Physical Exam: BP 136/68 (BP Location: Left Arm, Patient Position: Sitting, Cuff Size: Normal)   Pulse 86   Ht 5\' 2"  (1.575 m)   Wt 183 lb 12 oz (83.3 kg)   BMI 33.61 kg/m   General:  Obese, elderly woman, seated comfortably in the exam room. She is accompanied by her daughter. HEENT: No conjunctival pallor  or scleral icterus.  Moist mucous membranes.  OP clear. Neck: Supple without lymphadenopathy, thyromegaly, JVD, or HJR.  No carotid bruit. Lungs: Normal work of breathing.  Clear to auscultation bilaterally without wheezes or crackles. Heart: Irregularly irregular rhythm without murmurs or rubs.  Unable to assess PMI due to body habitus. Abd: Bowel sounds present.  Soft, NT/ND without hepatosplenomegaly Ext: Trace pretibial edema bilaterally.  Radial and dorsalis pedis pulses are 2+ bilaterally. Posterior tibial pulses are nonpalpable bilaterally. Skin: Small ulceration noted along the lateral aspect of the left fifth digit measuring approximately 1 cm in diameter. Purplish discoloration of both distal calves and feet with prominent varicose veins is noted.  EKG: Atrial fibrillation with PVCs versus occasional aberrancy. Rhythm is more pronounced on extended rhythm strip. Atrial fibrillation is new from prior tracings.  Lab Results  Component Value Date   WBC 9.3 07/23/2016   HGB 12.2 11/22/2015   HCT 38.8 07/23/2016   MCV 91 07/23/2016   PLT 260 07/23/2016    Lab Results  Component Value Date   NA 143 07/23/2016   K 4.6 07/23/2016   CL 102 07/23/2016   CO2  29 07/23/2016   BUN 22 07/23/2016   CREATININE 0.94 07/23/2016   GLUCOSE 139 (H) 07/23/2016   ALT 23 07/23/2016    Lab Results  Component Value Date   CHOL 151 07/23/2016   HDL 57 07/23/2016   LDLCALC 73 07/23/2016   TRIG 103 07/23/2016   CHOLHDL 2.6 07/23/2016   --------------------------------------------------------------------------------------------------  ASSESSMENT AND PLAN: Atrial fibrillation This is a new diagnosis for the patient. Chronicity is uncertain, though I suspect this may have been going on for several weeks, leading to Ms. Roundtree's increased fatigue and edema. Heart rate is relatively well controlled, though at times, she has brief episodes of tachycardia. Echocardiogram and myocardial perfusion  stress test within the last year were unrevealing. I will repeat a complete metabolic panel today, magnesium level, CBC, and TSH. We will initiate metoprolol succinate 12.5 mg daily. I have also discussed risks and benefits of anticoagulation, given Ms. Previti's CHADSVASC score of at least 5 (HTN, DM, female, and age x 2). We have agreed to start apixaban 5 mg twice a day. If she remains significantly fatigued with persistent atrial fibrillation, we could consider cardioversion after one month of anticoagulation.  Lower extremity edema This could be multifactorial, including an element of heart failure with preserved ejection fraction (potentially exacerbated by atrial fibrillation) and venous insufficiency. As above, we will check a complete metabolic panel today. I will have asked the patient to continue with furosemide 20 mg daily and compression stockings. If her swelling worsens, we may need to consider repeating an echocardiogram to assess for interval decline in LV function, given new atrial fibrillation.  Decreased pedal pulses with left fifth toe wound This has been a chronic wound that is slow to heal. Given her diminished posterior tibial pulses bilaterally and history of diabetes, I am concerned for underlying PVD. We will begin with bilateral ABIs and proceed to more extensive testing, as needed. The patient should continue to follow with her podiatrist for wound care.  Hypertension Blood pressure mildly elevated, given history of diabetes mellitus. As above, we will add low-dose metoprolol.  Follow-up: Return to clinic in 3-4 weeks.  Nelva Bush, MD 08/13/2016 8:17 AM

## 2016-08-13 NOTE — Patient Instructions (Signed)
Medication Instructions:  Your physician has recommended you make the following change in your medication:  1- START TAKING Metoprolol succinate 1/2 tablet (12.5 mg) by mouth once a day. 2- START TAKING Eliquis 5 mg (1 tablet) by mouth twice a day.    Labwork: Your physician recommends that you return for lab work in: [place] CBC, CMP, Mg, TSH.   Testing/Procedures: Your physician has requested that you have an ankle brachial index (ABI). During this test an ultrasound and blood pressure cuff are used to evaluate the arteries that supply the arms and legs with blood. Allow thirty minutes for this exam. There are no restrictions or special instructions.  Your physician has requested that you have a lower extremity arterial doppler- During this test, ultrasound is used to evaluate arterial blood flow in the legs. Allow approximately one hour for this exam.     Follow-Up: Your physician recommends that you schedule a follow-up appointment in: 3-4 [place].   If you need a refill on your cardiac medications before your next appointment, please call your pharmacy.   Ankle-Brachial Index Test The ankle-brachial index (ABI) test is used to find peripheral vascular disease (PVD). PVD is also known as peripheral arterial disease (PAD). PVD is the blocking or hardening of the arteries anywhere within the circulatory system beyond the heart. PVD is caused by cholesterol deposits in your blood vessels (atherosclerosis). These deposits cause arteries to narrow. The delivery of oxygen to your tissues is impaired as a result. This can cause muscle pain and fatigue. This is called claudication. PVD means there may also be buildup of cholesterol in your:  Heart. This increases the risk of heart attacks.  Brain. This increases the risk of strokes.  The ankle-brachial index test measures the blood flow in your arms and legs. This test also determines if blood vessels in your leg  are narrowed by cholesterol deposits. There are additional causes of a reduced ankle-brachial index, such as inflammation of vessels or a clot in the vessels. However, these are much less common than narrowing due to cholesterol deposits. What is being tested? The test is done while you are lying down and resting. Measurements are taken of the systolic pressure:  In your arm (brachial).  In your ankle at several points along your leg.  Systolic pressure is the pressure inside your arteries when your heart pumps. The measurements are taken several times on both sides. Then, the highest systolic pressure of the ankle is divided by the highest brachial systolic pressure. The result is the ankle-brachial pressure ratio, or ABI. Sometimes this test is repeated after you have exercised on a treadmill for five minutes. You may have leg pain during the exercise portion of the test if you suffer from PAD. If the index number drops after exercise, this may show that PAD is present. A normal ABI ratio is between 0.9 and 1.4. A value below 0.9 is considered abnormal. This information is not intended to replace advice given to you by your health care provider. Make sure you discuss any questions you have with your health care provider. Document Released: 02/29/2004 Document Revised: 08/02/2015 Document Reviewed: 09/30/2013 Elsevier Interactive Patient Education  [employer].

## 2016-08-14 ENCOUNTER — Telehealth: Payer: Self-pay | Admitting: *Deleted

## 2016-08-14 ENCOUNTER — Telehealth: Payer: Self-pay

## 2016-08-14 ENCOUNTER — Other Ambulatory Visit: Payer: Self-pay | Admitting: Internal Medicine

## 2016-08-14 DIAGNOSIS — S81809A Unspecified open wound, unspecified lower leg, initial encounter: Secondary | ICD-10-CM

## 2016-08-14 DIAGNOSIS — Z5181 Encounter for therapeutic drug level monitoring: Secondary | ICD-10-CM

## 2016-08-14 DIAGNOSIS — R609 Edema, unspecified: Secondary | ICD-10-CM

## 2016-08-14 DIAGNOSIS — Z79899 Other long term (current) drug therapy: Secondary | ICD-10-CM

## 2016-08-14 LAB — CBC WITH DIFFERENTIAL/PLATELET
Basophils Absolute: 0 10*3/uL (ref 0.0–0.2)
Basos: 0 %
EOS (ABSOLUTE): 0.3 10*3/uL (ref 0.0–0.4)
EOS: 5 %
HEMATOCRIT: 39.4 % (ref 34.0–46.6)
HEMOGLOBIN: 13 g/dL (ref 11.1–15.9)
Immature Grans (Abs): 0 10*3/uL (ref 0.0–0.1)
Immature Granulocytes: 0 %
LYMPHS ABS: 3.6 10*3/uL — AB (ref 0.7–3.1)
Lymphs: 53 %
MCH: 30 pg (ref 26.6–33.0)
MCHC: 33 g/dL (ref 31.5–35.7)
MCV: 91 fL (ref 79–97)
MONOCYTES: 12 %
Monocytes Absolute: 0.8 10*3/uL (ref 0.1–0.9)
Neutrophils Absolute: 2 10*3/uL (ref 1.4–7.0)
Neutrophils: 30 %
Platelets: 283 10*3/uL (ref 150–379)
RBC: 4.34 x10E6/uL (ref 3.77–5.28)
RDW: 13.8 % (ref 12.3–15.4)
WBC: 6.7 10*3/uL (ref 3.4–10.8)

## 2016-08-14 LAB — COMPREHENSIVE METABOLIC PANEL
ALBUMIN: 4 g/dL (ref 3.5–4.7)
ALT: 24 IU/L (ref 0–32)
AST: 18 IU/L (ref 0–40)
Albumin/Globulin Ratio: 1.7 (ref 1.2–2.2)
Alkaline Phosphatase: 45 IU/L (ref 39–117)
BUN / CREAT RATIO: 26 (ref 12–28)
BUN: 37 mg/dL — ABNORMAL HIGH (ref 8–27)
Bilirubin Total: 0.5 mg/dL (ref 0.0–1.2)
CALCIUM: 9.3 mg/dL (ref 8.7–10.3)
CO2: 23 mmol/L (ref 18–29)
CREATININE: 1.4 mg/dL — AB (ref 0.57–1.00)
Chloride: 99 mmol/L (ref 96–106)
GFR, EST AFRICAN AMERICAN: 40 mL/min/{1.73_m2} — AB (ref >59)
GFR, EST NON AFRICAN AMERICAN: 35 mL/min/{1.73_m2} — AB (ref >59)
GLOBULIN, TOTAL: 2.3 g/dL (ref 1.5–4.5)
Glucose: 144 mg/dL — ABNORMAL HIGH (ref 65–99)
Potassium: 4.7 mmol/L (ref 3.5–5.2)
SODIUM: 138 mmol/L (ref 134–144)
TOTAL PROTEIN: 6.3 g/dL (ref 6.0–8.5)

## 2016-08-14 LAB — MAGNESIUM: Magnesium: 1.8 mg/dL (ref 1.6–2.3)

## 2016-08-14 LAB — TSH: TSH: 5.18 u[IU]/mL — ABNORMAL HIGH (ref 0.450–4.500)

## 2016-08-14 MED ORDER — FUROSEMIDE 20 MG PO TABS: 20.0000 mg | ORAL_TABLET | ORAL | 2 refills | Status: DC

## 2016-08-14 NOTE — Telephone Encounter (Signed)
Patient's daughter Neoma Laming advised. She states patient was advised by Dr[facility] Darn[facility]. Patient's daughter will be out of town and states patient would feel more comfortable coming here vs. [facility]. Appointment scheduled here on 6/21 for repeat TSH and BMP. BMP has been ordered as a future order by Dr. Darnelle Bos office.

## 2016-08-14 NOTE — Telephone Encounter (Signed)
-----   Message from Margo Common, [place] sent at 08/14/2016  8:21 AM EDT ----- Schedule appointment to check thyroid as Dr. Saunders Revel suggested.

## 2016-08-14 NOTE — Telephone Encounter (Signed)
Received incoming call from patient's daughter, ok per DPR. She verbalized understanding of results and plan of care as well. She plans to call her mother to discuss them with her as well.

## 2016-08-14 NOTE — Telephone Encounter (Signed)
-----   Message from Nelva Bush, MD sent at 08/14/2016  6:41 AM EDT ----- Please let Ms. Guse know that her kidney function is slightly worse than baseline, which may reflect dehydration given increased Lasix use in the last few weeks. I suggest that she increase her water intake and consider decreasing furosemide to 20 mg every other day. She should let us know if she has worsening swelling. Additionally, her TSH is mildly elevated. She should contact her PCP for further evaluation, as hypothyroidism could contribute to some of her recent symptoms. She should come back in about 2 weeks for a repeat basic metabolic panel.

## 2016-08-14 NOTE — Telephone Encounter (Signed)
No answer on daughter's cell phone.  Called and spoke with patient at home. She verbalized understanding of results and recommendations. Advised her that Dr Natale Milch is aware of TSH already and to reach out to his office to schedule an appt. She verbalized understanding to go to [place] in 2 weeks for labs as well. She asked that I give this same information to her daughter. Left message with daughter and will reach out to her if I do not receive return call in a timely fashion. Orders for repeat labs and furosemide change made it EPIC.

## 2016-08-14 NOTE — Telephone Encounter (Signed)
Will see her on the 21st.

## 2016-08-19 ENCOUNTER — Other Ambulatory Visit: Payer: Self-pay | Admitting: Gastroenterology

## 2016-08-26 ENCOUNTER — Ambulatory Visit (INDEPENDENT_AMBULATORY_CARE_PROVIDER_SITE_OTHER): Payer: Medicare Other | Admitting: Podiatry

## 2016-08-26 VITALS — BP 112/57 | HR 68

## 2016-08-26 DIAGNOSIS — L97522 Non-pressure chronic ulcer of other part of left foot with fat layer exposed: Secondary | ICD-10-CM

## 2016-08-26 DIAGNOSIS — E0842 Diabetes mellitus due to underlying condition with diabetic polyneuropathy: Secondary | ICD-10-CM | POA: Diagnosis not present

## 2016-08-26 DIAGNOSIS — I70245 Atherosclerosis of native arteries of left leg with ulceration of other part of foot: Secondary | ICD-10-CM | POA: Diagnosis not present

## 2016-08-27 ENCOUNTER — Ambulatory Visit: Payer: Medicare Other | Admitting: Podiatry

## 2016-08-28 ENCOUNTER — Encounter: Payer: Self-pay | Admitting: Family Medicine

## 2016-08-28 ENCOUNTER — Ambulatory Visit (INDEPENDENT_AMBULATORY_CARE_PROVIDER_SITE_OTHER): Payer: Medicare Other | Admitting: Family Medicine

## 2016-08-28 VITALS — BP 92/52 | HR 63 | Temp 97.6°F | Wt 185.6 lb

## 2016-08-28 DIAGNOSIS — E1100 Type 2 diabetes mellitus with hyperosmolarity without nonketotic hyperglycemic-hyperosmolar coma (NKHHC): Secondary | ICD-10-CM

## 2016-08-28 DIAGNOSIS — I4891 Unspecified atrial fibrillation: Secondary | ICD-10-CM

## 2016-08-28 DIAGNOSIS — I10 Essential (primary) hypertension: Secondary | ICD-10-CM | POA: Diagnosis not present

## 2016-08-28 DIAGNOSIS — R2689 Other abnormalities of gait and mobility: Secondary | ICD-10-CM

## 2016-08-28 DIAGNOSIS — R5383 Other fatigue: Secondary | ICD-10-CM

## 2016-08-28 NOTE — Progress Notes (Signed)
[name]    DOB: [birth year]   83 y.o.   MRN: [id number] Visit Date: 08/28/2016  Today's Provider: [name], PA   Chief Complaint  Patient presents with  . Abnormal Lab  . Follow-up   Subjective:    HPI Patient is here today to have TSH and BMP repeated per Dr. Saunders Revel. Labs done on 08/13/2016 showed kidney function slightly worse than baseline, and TSH mildly elevated. Patient advised to increase water intake and decrease Lasix to 20 mg every other day. Patient reports good compliance with recommendations. She reports feeling extremely fatigue and sluggish since seeing Dr. Saunders Revel on 08/13/2016.  Past Medical History:  Diagnosis Date  . Diabetes mellitus without complication ([place])   . Diverticulitis   . History of echocardiogram    a. 11/2015: echo showing EF of 60-65% with no WMA. Grade 1 DD and mild MR noted.   . Hypertension   . Syncope    a. initially occurring in Fall 2016 b. Hospitalized in 10/2015 and 11/2015 for recurrent episodes.    Past Surgical History:  Procedure Laterality Date  . ABDOMINAL HYSTERECTOMY    . BACK SURGERY  08/08/2008   Lumbar discectomy  . NECK SURGERY  1980   Family History  Problem Relation Age of Onset  . Hypertension Mother   . Diabetes Mother   . Lung cancer Father    Allergies  Allergen Reactions  . Morphine Sulfate Other (See Comments)    BP bottoms out  . Penicillins Diarrhea    Has patient had a PCN reaction causing immediate rash, facial/tongue/throat swelling, SOB or lightheadedness with hypotension: Yes Has patient had a PCN reaction causing severe rash involving mucus membranes or skin necrosis: Yes Has patient had a PCN reaction that required hospitalization No Has patient had a PCN reaction occurring within the last 10 years: No If all of the above answers are "NO", then may proceed with Cephalosporin use.     Previous Medications   APIXABAN (ELIQUIS) 5 MG TABS TABLET    Take 1 tablet (5 mg total) by  mouth 2 (two) times daily.   ASPIRIN 81 MG TABLET    Take 81 mg by mouth daily.    BUDESONIDE (ENTOCORT EC) 3 MG 24 HR CAPSULE    TAKE THREE CAPSULES BY MOUTH DAILY   CHOLECALCIFEROL (VITAMIN D) 2000 UNITS CAPS    Take 1 capsule by mouth daily.    FUROSEMIDE (LASIX) 20 MG TABLET    Take 1 tablet (20 mg total) by mouth every other day.   GENTAMICIN CREAM (GARAMYCIN) 0.1 %    Apply 1 application topically 3 (three) times daily.   GLIPIZIDE (GLUCOTROL XL) 5 MG 24 HR TABLET    Take 1 tablet (5 mg total) by mouth daily with breakfast.   LISINOPRIL (PRINIVIL,ZESTRIL) 10 MG TABLET    Take 1 tablet (10 mg total) by mouth daily.   METFORMIN (GLUCOPHAGE) 500 MG TABLET    1 tablet in the morning and 2 tablets in the evening   METOPROLOL SUCCINATE (TOPROL XL) 25 MG 24 HR TABLET    Take 0.5 tablets (12.5 mg total) by mouth daily.   MULTIPLE VITAMIN TABLET    Take 1 tablet by mouth every evening. Reported on 03/13/2015   PREGABALIN (LYRICA) 50 MG CAPSULE    Take 1 capsule (50 mg total) by mouth 2 (two) times daily.   SIMVASTATIN (ZOCOR) 20 MG TABLET    Take 1 tablet (20 mg  total) by mouth at bedtime.    Review of Systems  Constitutional: Positive for fatigue.  Respiratory: Negative.   Cardiovascular: Negative.     Social History  Substance Use Topics  . Smoking status: Former Smoker    Quit date: 03/09/1978  . Smokeless tobacco: Never Used  . Alcohol use 0.6 oz/week    1 Glasses of wine per week     Comment: none since hospitalization   Objective:   BP (!) 92/52 (BP Location: Right Arm, Patient Position: Sitting, Cuff Size: Normal)   Pulse 63   Temp 97.6 F (36.4 C) (Oral)   Wt 185 lb 9.6 oz (84.2 kg)   BMI 33.95 kg/m   Physical Exam  Constitutional: She is oriented to person, place, and time. She appears well-developed and well-nourished. No distress.  HENT:  Head: Normocephalic and atraumatic.  Right Ear: Hearing normal.  Left Ear: Hearing normal.  Nose: Nose normal.  Eyes:  Conjunctivae and lids are normal. Right eye exhibits no discharge. Left eye exhibits no discharge. No scleral icterus.  Pulmonary/Chest: Effort normal. No respiratory distress.  Musculoskeletal:  Poor balance with peripheral neuropathy and open sore on left 5th toe.  Neurological: She is alert and oriented to person, place, and time.  Skin: Skin is intact. No lesion and no rash noted.  Psychiatric: She has a normal mood and affect. Her speech is normal and behavior is normal. Thought content normal.      Assessment & Plan:     1. Atrial fibrillation, unspecified type (HCC) Stable with slight irregularity. Lower leg edema better with use of support hose and taking Furosemide 20 mg qod with Metoprolol 25 mg 1/2 tab qd. Cardiologist has scheduled ultrasound to check poor pulse in the left foot to rule out PVD. No chest pains or significant dyspnea. Given form for handicap placard due to peripheral neuropathy and balance problems.  2. Essential (primary) hypertension BP low today. Continue present meds and lower Lisinopril to 5 mg qd. Recheck labs as requested by her cardiologist (Dr. Saunders Revel) - CBC with Differential/Platelet - Basic Metabolic Panel (BMET)  3. Fatigue, unspecified type Noticing more fatigue and cardiologist (Dr. Saunders Revel) found an increase in TSH on 08-15-16. BP low today. Recommend decreasing Lisinopril to 5 mg qd especially with using Furosemide 20 mg qod and Metoprolol 25 mg 0.5 tablet qd for BP control. Check CBC, BMP, TSH and T4 to rule out other metabolic disorders. - CBC with Differential/Platelet - Basic Metabolic Panel (BMET) - TSH - T4  4. Type 2 diabetes mellitus with hyperosmolarity without coma, without long-term current use of insulin (HCC) Tolerating Metformin 500 mg 1 tab in the morning and 2 in the evening with Glipizide 5 mg qd. Trying to watch diet. Hgb A1C was 8.5% 07-23-16. On 08-13-16 her cardiologist found the glucose was up to 144. Will recheck BMP. Having difficulty  exercising due to poor balance from peripheral polyneuropathy. Has a foot ulcer/wound on the left 5th toe followed by Dr. Amalia Hailey (podiatrist - last appointment 08-26-16). Still using Lyrica 50 mg BID for pain. Continue follow up with Dr. Amalia Hailey. Schedule home health evaluation and treatment. - Basic Metabolic Panel (BMET) - Ambulatory referral to [facility]  5. Poor balance Walking with a cane for support. Having more problems with balance and ambulating due to polyneuropathy. Will schedule Home Health PT for assessment and treatment of ambulation and safety at home. - Ambulatory referral to [place]

## 2016-08-30 LAB — CBC WITH DIFFERENTIAL/PLATELET
Basophils Absolute: 0 10*3/uL (ref 0.0–0.2)
Basos: 1 %
EOS (ABSOLUTE): 0.1 10*3/uL (ref 0.0–0.4)
EOS: 2 %
HEMATOCRIT: 37.3 % (ref 34.0–46.6)
Hemoglobin: 12.4 g/dL (ref 11.1–15.9)
Immature Grans (Abs): 0.1 10*3/uL (ref 0.0–0.1)
Immature Granulocytes: 1 %
LYMPHS ABS: 4.3 10*3/uL — AB (ref 0.7–3.1)
Lymphs: 59 %
MCH: 30.4 pg (ref 26.6–33.0)
MCHC: 33.2 g/dL (ref 31.5–35.7)
MCV: 91 fL (ref 79–97)
MONOS ABS: 0.8 10*3/uL (ref 0.1–0.9)
Monocytes: 10 %
Neutrophils Absolute: 2 10*3/uL (ref 1.4–7.0)
Neutrophils: 27 %
Platelets: 227 10*3/uL (ref 150–379)
RBC: 4.08 x10E6/uL (ref 3.77–5.28)
RDW: 12.9 % (ref 12.3–15.4)
WBC: 7.3 10*3/uL (ref 3.4–10.8)

## 2016-08-30 LAB — BASIC METABOLIC PANEL
BUN / CREAT RATIO: 31 — AB (ref 12–28)
BUN: 31 mg/dL — ABNORMAL HIGH (ref 8–27)
CO2: 26 mmol/L (ref 20–29)
Calcium: 9 mg/dL (ref 8.7–10.3)
Chloride: 104 mmol/L (ref 96–106)
Creatinine, Ser: 1 mg/dL (ref 0.57–1.00)
GFR, EST AFRICAN AMERICAN: 60 mL/min/{1.73_m2} (ref >59)
GFR, EST NON AFRICAN AMERICAN: 52 mL/min/{1.73_m2} — AB (ref >59)
Glucose: 97 mg/dL (ref 65–99)
POTASSIUM: 4.5 mmol/L (ref 3.5–5.2)
SODIUM: 143 mmol/L (ref 134–144)

## 2016-08-30 LAB — TSH: TSH: 2.99 u[IU]/mL (ref 0.450–4.500)

## 2016-08-30 LAB — T4: T4, Total: 5.4 ug/dL (ref 4.5–12.0)

## 2016-08-30 NOTE — Progress Notes (Signed)
   Subjective:  Patient with a history of diabetes mellitus presents today for follow-up evaluation of an ulcer to the left fifth toe. She reports the wound is improving and is not as sore at this time. She states she finished her entire course of antibiotics.    Objective/Physical Exam General: The patient is alert and oriented x3 in no acute distress.  Dermatology: Skin is warm, dry and supple bilateral lower extremities. Negative for open lesions or macerations.complete reepithelialization of the ulcer has occurred  Vascular: Erythema with edema to the left fifth toe. Palpable pedal pulses bilaterally. Capillary refill within normal limits.  Neurological: Epicritic and protective threshold absent bilaterally.   Musculoskeletal Exam: Range of motion within normal limits to all pedal and ankle joints bilateral. Muscle strength 5/5 in all groups bilateral.    Assessment: #1 ulcer of the left fifth toe secondary to diabetes mellitus-healed #2 diabetes mellitus w/ peripheral neuropathy   Plan of Care:  #1 Patient was evaluated.  #2 silicone toe caps dispensed. #3 recommended good shoe gear. #4 return to clinic when necessary.  Edrick Kins, DPM [facility]  Dr. Edrick Kins, [address]                                        [place], [place] [address]                Office [phone]  Fax [phone]

## 2016-09-01 ENCOUNTER — Other Ambulatory Visit: Payer: Self-pay | Admitting: Family Medicine

## 2016-09-01 ENCOUNTER — Telehealth: Payer: Self-pay

## 2016-09-01 DIAGNOSIS — I10 Essential (primary) hypertension: Secondary | ICD-10-CM

## 2016-09-01 MED ORDER — LISINOPRIL 5 MG PO TABS: 5.0000 mg | ORAL_TABLET | Freq: Every day | ORAL | 3 refills | Status: DC

## 2016-09-01 NOTE — Telephone Encounter (Signed)
Done. Need to have nurse check BP in the office in 2 weeks.

## 2016-09-01 NOTE — Telephone Encounter (Signed)
Patient called saying that you wanted her to decrease the Lisinopril to 5mg  daily. She reports that she has been cutting the 10mg  in half with a knife to get this dose. She says that the tablets are not scored, and she doesn't know if she gets the correct amount. She is wanting to know if you could send in the 5mg  tablet into [facility] so it would be easier. Contact info is correct. Thanks!

## 2016-09-02 NOTE — Telephone Encounter (Signed)
Patient's daughter Neoma Laming advised.

## 2016-09-03 ENCOUNTER — Telehealth: Payer: Self-pay | Admitting: Family Medicine

## 2016-09-03 NOTE — Telephone Encounter (Signed)
Anderson Malta PT with [facility] called wanting a verbal on Mrs. Folds.  She wants to start PT 1 time week for 1 week and 2 times a week for 4 weeks.   Please advise [phone]  Thanks teri

## 2016-09-04 NOTE — Telephone Encounter (Signed)
Give verbal order to start PT once a week for 1 week then twice a week for 4 weeks to improve balance and ambulation.

## 2016-09-04 NOTE — Telephone Encounter (Signed)
LMTCB

## 2016-09-08 ENCOUNTER — Ambulatory Visit: Payer: Medicare Other | Admitting: Physician Assistant

## 2016-09-09 NOTE — Telephone Encounter (Signed)
LMTCB ED 

## 2016-09-11 NOTE — Progress Notes (Addendum)
Cardiology Office Note    Date:  09/17/2016   ID:  [name], DOB 1932-01-13, MRN [id number]  PCP:  Margo Common, PA  Cardiologist: Dr. Saunders Revel  Chief Complaint  Patient presents with  . other    F/u ABI and lower extremity US. Pt in donut hole with Eliquis. Meds reviewed verbally with pt.    History of Present Illness:    [name] is a 83 y.o. female with past medical history of Type 2 DM, HTN, HLD, recurrent syncope (thought to be secondary to a vasovagal etiology), and new-onset atrial fibrillation (diagnosed in 08/2016) who presents to the office today for 4-week follow-up.   She was last examined by Dr. Saunders Revel on 08/13/2016 for worsening lower extremity edema over the past month. She has traveled to [place] and upon returning, resumed Lasix 20mg  daily along with using compression stockings. She denied any associated orthopnea, PND, or lower extremity edema. EKG showed new-onset atrial fibrillation with a HR of 79. She was started on Eliquis 5mg  BID for anticoagulation in the setting of her elevated CHA2DS2-VASc Score of 5 along with Toprol-XL 12.5mg  daily for rate-control. She was also found to have a non-healing wound along her 5th toe, therefore bilateral ABI's were recommended. These were obtained on 09/15/2016 and showed normal ABI's.   In talking with the patient today, she reports overall doing well from a cardiac perspective since her last office visit. She denies any recent chest pain, dyspnea on exertion, palpitations, orthopnea, PND or lower extremity edema. She lives by herself and takes care of her home. Can make the bed daily without any exertional symptoms. Does report becoming "winded" when putting on her compression stockings in the morning.   She reports good compliance with her Eliquis. Denies any recent melena or hematochezia.   Is planning to undergo amputation of his left fifth digit by Dr. Amalia Hailey later this month.    Past Medical History:  Diagnosis  Date  . Diabetes mellitus without complication ([place])   . Diverticulitis   . History of echocardiogram    a. 11/2015: echo showing EF of 60-65% with no WMA. Grade 1 DD and mild MR noted.   . Hypertension   . PAF (paroxysmal atrial fibrillation) ([place])    a. initially diagnosed in 08/2016 --> started on Eliquis  . Syncope    a. initially occurring in Fall 2016 b. Hospitalized in 10/2015 and 11/2015 for recurrent episodes.     Past Surgical History:  Procedure Laterality Date  . ABDOMINAL HYSTERECTOMY    . BACK SURGERY  08/08/2008   Lumbar discectomy  . NECK SURGERY  1980    Current Medications: Outpatient Medications Prior to Visit  Medication Sig Dispense Refill  . apixaban (ELIQUIS) 5 MG TABS tablet Take 1 tablet (5 mg total) by mouth 2 (two) times daily. 180 tablet 3  . aspirin 81 MG tablet Take 81 mg by mouth daily.     . budesonide (ENTOCORT EC) 3 MG 24 hr capsule TAKE THREE CAPSULES BY MOUTH DAILY 90 capsule 1  . Cholecalciferol (VITAMIN D) 2000 UNITS CAPS Take 1 capsule by mouth daily.     . furosemide (LASIX) 20 MG tablet Take 1 tablet (20 mg total) by mouth every other day. 30 tablet 2  . gentamicin cream (GARAMYCIN) 0.1 % Apply 1 application topically 3 (three) times daily. 15 g 0  . glipiZIDE (GLUCOTROL XL) 5 MG 24 hr tablet Take 1 tablet (5 mg total) by mouth  daily with breakfast. 90 tablet 3  . lisinopril (PRINIVIL,ZESTRIL) 5 MG tablet Take 1 tablet (5 mg total) by mouth daily. 30 tablet 3  . metFORMIN (GLUCOPHAGE) 500 MG tablet 1 tablet in the morning and 2 tablets in the evening (Patient taking differently: 1 tablet in the morning and 1 tablets in the evening) 90 tablet 12  . Multiple Vitamin tablet Take 1 tablet by mouth every evening. Reported on 03/13/2015    . simvastatin (ZOCOR) 20 MG tablet Take 1 tablet (20 mg total) by mouth at bedtime. 90 tablet 3  . sulfamethoxazole-trimethoprim (BACTRIM) 400-80 MG tablet Take 1 tablet by mouth 2 (two) times daily. 20 tablet 0    . metoprolol succinate (TOPROL XL) 25 MG 24 hr tablet Take 0.5 tablets (12.5 mg total) by mouth daily. 45 tablet 3  . lisinopril (PRINIVIL,ZESTRIL) 10 MG tablet     . pregabalin (LYRICA) 50 MG capsule Take 1 capsule (50 mg total) by mouth 2 (two) times daily. (Patient not taking: Reported on 09/17/2016) 60 capsule 3   No facility-administered medications prior to visit.      Allergies:   Morphine sulfate and Penicillins   Social History   Social History  . Marital status: Widowed    Spouse name: N/A  . Number of children: N/A  . Years of education: N/A   Social History Main Topics  . Smoking status: Former Smoker    Quit date: 03/09/1978  . Smokeless tobacco: Never Used  . Alcohol use 0.6 oz/week    1 Glasses of wine per week     Comment: none since hospitalization  . Drug use: No  . Sexual activity: Not Asked   Other Topics Concern  . None   Social History Narrative  . None     Family History:  The patient's family history includes Diabetes in her mother; Hypertension in her mother; Lung cancer in her father.   Review of Systems:   Please see the history of present illness.     General:  No chills, fever, night sweats or weight changes. Positive for left foot pain.  Cardiovascular:  No chest pain, dyspnea on exertion, edema, orthopnea, palpitations, paroxysmal nocturnal dyspnea. Dermatological: No rash, lesions/masses Respiratory: No cough, dyspnea Urologic: No hematuria, dysuria Abdominal:   No nausea, vomiting, diarrhea, bright red blood per rectum, melena, or hematemesis Neurologic:  No visual changes, wkns, changes in mental status.  All other systems reviewed and are otherwise negative except as noted above.   Physical Exam:    VS:  BP (!) 150/83 (BP Location: Left Arm, Patient Position: Sitting, Cuff Size: Large)   Pulse 77   Ht 5\' 2"  (1.575 m)   Wt 186 lb 4 oz (84.5 kg)   BMI 34.07 kg/m    General: Well developed, well nourished Caucasian female  appearing in no acute distress. Head: Normocephalic, atraumatic, sclera non-icteric, no xanthomas, nares are without discharge.  Neck: No carotid bruits. JVD not elevated.  Lungs: Respirations regular and unlabored, without wheezes or rales.  Heart: Regular rate and rhythm with occasional ectopic beats. No S3 or S4.  No murmur, no rubs, or gallops appreciated. Abdomen: Soft, non-tender, non-distended with normoactive bowel sounds. No hepatomegaly. No rebound/guarding. No obvious abdominal masses. Msk:  Strength and tone appear normal for age. No joint deformities or effusions. Extremities: No clubbing or cyanosis. No edema.  Distal pedal pulses are 2+ bilaterally. Neuro: Alert and oriented X 3. Moves all extremities spontaneously. No focal deficits noted. Psych:  Responds to questions appropriately with a normal affect. Skin: No rashes or lesions noted  Wt Readings from Last 3 Encounters:  09/17/16 186 lb 4 oz (84.5 kg)  08/28/16 185 lb 9.6 oz (84.2 kg)  08/13/16 183 lb 12 oz (83.3 kg)     Studies/Labs Reviewed:   EKG:  EKG is ordered today.  The ekg ordered today demonstrates NSR, HR 74, with no acute ST or T-wave changes.   Recent Labs: 08/13/2016: ALT 24; Magnesium 1.8 08/29/2016: BUN 31; Creatinine, Ser 1.00; Hemoglobin 12.4; Platelets 227; Potassium 4.5; Sodium 143; TSH 2.990   Lipid Panel    Component Value Date/Time   CHOL 151 07/23/2016 0925   TRIG 103 07/23/2016 0925   HDL 57 07/23/2016 0925   CHOLHDL 2.6 07/23/2016 0925   LDLCALC 73 07/23/2016 0925    Additional studies/ records that were reviewed today include:   Echocardiogram: 11/2015 Study Conclusions  - Left ventricle: The cavity size was normal. Systolic function was   normal. The estimated ejection fraction was in the range of 60%   to 65%. Wall motion was normal; there were no regional wall   motion abnormalities. Doppler parameters are consistent with   abnormal left ventricular relaxation (grade 1  diastolic   dysfunction). - Mitral valve: There was mild regurgitation. - Left atrium: The atrium was normal in size. - Right ventricle: Systolic function was normal. - Pulmonary arteries: Systolic pressure was within the normal   range.  Impressions: - Normal study.  NST: 01/08/2016  No significant ischemia.  Small apical anterior and apical defect is present, more pronounced on ther rest images. This most likely represents shifting breast attenuation but cannot rule out small area of scar.  The left ventricular ejection fraction is normal (55-65%).  Study is degraded by extracardiac activity and motion.  This is a low risk study.   Assessment:    1. Paroxysmal atrial fibrillation (HCC)   2. Current use of long term anticoagulation   3. Essential (primary) hypertension   4. Mixed hyperlipidemia   5. Non-healing wound of lower extremity, left, subsequent encounter      Plan:   In order of problems listed above:  1. Paroxysmal Atrial Fibrillation/ Long-term Anticoagulation - was found to be in atrial fibrillation at her last office visit with Dr. Saunders Revel on 08/13/2016. Was started on Eliquis 5mg  BID for anticoagulation in the setting of her elevated CHA2DS2-VASc Score of 5 along with Toprol-XL 12.5mg  daily for rate-control.  - she denies any recent chest pain, dyspnea on exertion, palpitations, orthopnea, PND or lower extremity edema. Able to perform her ADL's without exertional symptoms. NST in 12/2015 was low-risk. - initially on examination, her HR seemed irregular. An EKG was obtained and showed she was actually in NSR. Ectopic beats were likely secondary to PAC's/PVC's. - she denies any evidence of active bleeding, therefore will continue Eliquis for anticoagulation. Stop ASA 81mg  daily in the setting of being on Eliquis. Will titrate Toprol-XL to 25mg  daily in the setting of her elevated BP and occasional episodes of fatigue (the only symptom she reported when in atrial  fibrillation).  2. HTN - BP elevated to 150/83 during today's visit. Reports BP has been "well-controlled" at her recent office visits. - continue Lisinopril 5mg  daily. Will increase Toprol-XL from 12.5mg  daily to 25mg  daily.   3. HLD - Lipid Panel in 07/2016 showed total cholesterol of 151, HDL 57, and LDL 73. - remains on Simvastatin 20mg  daily.   4. Non-healing Wound  of Left Lower Extremity - she has a non-healing wound along her 5th toe with ABI's on 09/15/2016 being normal.  - she is being followed by Dr.Evans with Podiatry and is planning to undergo amputation of her left fifth digit later this month. We have not yet received a cardiac clearance form for her procedure. I advised the patient to follow-up with the surgeon to see if she needs to hold Eliquis for the procedure. If needed, she can hold Eliquis for 36 hours prior to her surgery.    Medication Adjustments/Labs and Tests Ordered: Current medicines are reviewed at length with the patient today.  Concerns regarding medicines are outlined above.  Medication changes, Labs and Tests ordered today are listed in the Patient Instructions below. Patient Instructions  Medication Instructions:  Your physician has recommended you make the following change in your medication:  1- INCREASE Toprol XL to 25 mg by mouth once daily.   Labwork: none  Testing/Procedures: none  Follow-Up: Your physician recommends that you schedule a follow-up appointment in: 3 MONTHS WITH DR END.  If you need a refill on your cardiac medications before your next appointment, please call your pharmacy.  Medication Samples have been provided to the patient.  Drug name: ELIQUIS       Strength: 5MG         Qty: 4 BOXES  LOT: [id number]  Exp.Date: OCT 2020    Signed, Erma Heritage, PA-C  09/17/2016 4:30 PM    [address] [place], [place]  [address] Phone: [phone]; Fax: [phone]  [address] [place], [place] [address] Phone: [phone]

## 2016-09-15 ENCOUNTER — Ambulatory Visit: Payer: Medicare Other

## 2016-09-15 DIAGNOSIS — S81809A Unspecified open wound, unspecified lower leg, initial encounter: Secondary | ICD-10-CM

## 2016-09-15 DIAGNOSIS — I10 Essential (primary) hypertension: Secondary | ICD-10-CM

## 2016-09-15 DIAGNOSIS — R0989 Other specified symptoms and signs involving the circulatory and respiratory systems: Secondary | ICD-10-CM

## 2016-09-15 DIAGNOSIS — R202 Paresthesia of skin: Secondary | ICD-10-CM | POA: Diagnosis not present

## 2016-09-15 NOTE — Telephone Encounter (Signed)
Anderson Malta called to say she got the voice mail for verbal orders.  No need to call her back.  teri

## 2016-09-15 NOTE — Telephone Encounter (Signed)
OK 

## 2016-09-15 NOTE — Telephone Encounter (Signed)
LMTCB

## 2016-09-16 ENCOUNTER — Ambulatory Visit (INDEPENDENT_AMBULATORY_CARE_PROVIDER_SITE_OTHER): Payer: Medicare Other | Admitting: Podiatry

## 2016-09-16 ENCOUNTER — Ambulatory Visit (INDEPENDENT_AMBULATORY_CARE_PROVIDER_SITE_OTHER): Payer: Medicare Other

## 2016-09-16 VITALS — Temp 98.2°F

## 2016-09-16 DIAGNOSIS — L97522 Non-pressure chronic ulcer of other part of left foot with fat layer exposed: Secondary | ICD-10-CM

## 2016-09-16 DIAGNOSIS — I70245 Atherosclerosis of native arteries of left leg with ulceration of other part of foot: Secondary | ICD-10-CM

## 2016-09-16 DIAGNOSIS — E0842 Diabetes mellitus due to underlying condition with diabetic polyneuropathy: Secondary | ICD-10-CM | POA: Diagnosis not present

## 2016-09-16 DIAGNOSIS — M869 Osteomyelitis, unspecified: Secondary | ICD-10-CM

## 2016-09-16 MED ORDER — SULFAMETHOXAZOLE-TRIMETHOPRIM 400-80 MG PO TABS: 1.0000 | ORAL_TABLET | Freq: Two times a day (BID) | ORAL | 0 refills | Status: DC

## 2016-09-16 NOTE — Progress Notes (Signed)
   HPI: 83 year old female presents to the office today for evaluation of an ulcer that reoccurred to the lateral aspect of the left fifth digit. Patient was last seen 08/26/2016 at which time we determined that the ulcer had healed. At that time I recommended good shoe gear, offloading silicone toe caps, and return to the clinic when necessary. Patient states that approximately 1 week after her last office visit the ulcer redeveloped and has been draining and the toe is been significantly red ever since the visit. Patient presents today for further treatment and evaluation   Physical Exam: General: The patient is alert and oriented x3 in no acute distress.  Dermatology: Ulcer noted to the lateral aspect of the fifth digit left foot measuring approximately 001.001.001.001 cm. To the noted ulceration there is no eschar. There is a moderate amount of slough fibrin and necrotic tissue noted. Granulation tissue and wound base is red. The ulcer does probe to bone. There is no malodor. Periwound integrity is intact.   Vascular: Palpable pedal pulses bilaterally. No edema or erythema noted. Capillary refill within normal limits.  Neurological: Epicritic and protective threshold grossly intact bilaterally.   Musculoskeletal Exam: Range of motion within normal limits to all pedal and ankle joints bilateral. Muscle strength 5/5 in all groups bilateral.   Radiographic Exam:  Radiographic exam was consistent with osteomyelitis. There is periosteal reaction and degenerative changes noted to the osseous structures the fifth digit left foot best seen on the medial oblique view.  Assessment: 1. Osteomyelitis fifth digit left foot 2. Cellulitis fifth digit left foot 3. Ulcer fifth digit left foot secondary to diabetes mellitus   Plan of Care:  1. Patient was evaluated.X-rays were reviewed today  2. Medically necessary excisional debridement including muscle and fascial tissues was performed using a tissue  nipper. Excisional debridement of all necrotic nonviable tissue down to healthy bleeding viable tissue was performed with post-debridement measurements same as pre-. 3. Antibiotic ointment and a dry sterile dressing was applied. 4. Today I had a very frank discussion with the patient regarding the nature of osteomyelitis. It is in her best interest to undergo surgical toe amputation fifth digit left foot. Patient understands and agrees. All possible complications and details the procedure were explained. All patient questions were answered. No guarantees were expressed or implied. 5. Authorization for surgery initiated today. Surgery will consist of amputation fifth digit left foot. Incision and drainage left foot. 6. Today deep wound cultures were taken and sent to pathology  7. Prescription for Bactrim DS  8. Return to clinic 1 week postop   Edrick Kins, DPM [facility]  Dr. Edrick Kins, [address] [place]                                        [place], [place] [address]                Office [phone]  Fax [phone]

## 2016-09-17 ENCOUNTER — Encounter: Payer: Self-pay | Admitting: Student

## 2016-09-17 ENCOUNTER — Telehealth: Payer: Self-pay | Admitting: *Deleted

## 2016-09-17 ENCOUNTER — Ambulatory Visit (INDEPENDENT_AMBULATORY_CARE_PROVIDER_SITE_OTHER): Payer: Medicare Other | Admitting: Student

## 2016-09-17 VITALS — BP 150/83 | HR 77 | Ht 62.0 in | Wt 186.2 lb

## 2016-09-17 DIAGNOSIS — I10 Essential (primary) hypertension: Secondary | ICD-10-CM | POA: Diagnosis not present

## 2016-09-17 DIAGNOSIS — I480 Paroxysmal atrial fibrillation: Secondary | ICD-10-CM

## 2016-09-17 DIAGNOSIS — S81802D Unspecified open wound, left lower leg, subsequent encounter: Secondary | ICD-10-CM

## 2016-09-17 DIAGNOSIS — E782 Mixed hyperlipidemia: Secondary | ICD-10-CM | POA: Diagnosis not present

## 2016-09-17 DIAGNOSIS — Z7901 Long term (current) use of anticoagulants: Secondary | ICD-10-CM

## 2016-09-17 MED ORDER — METOPROLOL SUCCINATE ER 25 MG PO TB24: 25.0000 mg | ORAL_TABLET | Freq: Every day | ORAL | 3 refills | Status: DC

## 2016-09-17 NOTE — Patient Instructions (Addendum)
Medication Instructions:  Your physician has recommended you make the following change in your medication:  1- INCREASE Toprol XL to 25 mg by mouth twice a day.   Labwork: none  Testing/Procedures: none  Follow-Up: Your physician recommends that you schedule a follow-up appointment in: 3 MONTHS WITH DR END.  If you need a refill on your cardiac medications before your next appointment, please call your pharmacy.  Medication Samples have been provided to the patient.  Drug name: ELIQUIS       Strength: 5MG         Qty: 4 BOXES  LOT: [id number]  Exp.Date: OCT 2020

## 2016-09-17 NOTE — Telephone Encounter (Signed)
"  Dr. Amalia Hailey told me to call you.  I am going to have surgery on July 19."  Dr. Amalia Hailey does not have any time available on July 19.  He can do it on July 26.  "Okay, I guess I will do it then.  What's next?"  You will need to register with the surgical center.  You can go online to register, instructions are in the brochure that was given to you on the first blue page.  If you do not have access to do it on-line, you can wait until they call you to get the information.  "When will they call me?"  Someone from the surgical center will call you a day or two prior to the surgery date.  They will get the information then and give you your arrival time.

## 2016-09-18 ENCOUNTER — Telehealth: Payer: Self-pay | Admitting: Family Medicine

## 2016-09-18 NOTE — Telephone Encounter (Signed)
Lana with [facility] called to say she went to the podiatrist.  She was prescribed a medication that will interfere with her lisinopril.  She was not able to get in touch with the podiatrist.  The name of the sulfamethoxazole 400 mg.  Please advise  Her call back is [phone]  Thanks teri

## 2016-09-18 NOTE — Telephone Encounter (Signed)
Please review-aa 

## 2016-09-19 ENCOUNTER — Telehealth: Payer: Self-pay | Admitting: Podiatry

## 2016-09-19 LAB — WOUND CULTURE: ORGANISM ID, BACTERIA: NONE SEEN

## 2016-09-19 NOTE — Telephone Encounter (Signed)
My research does show the possibility of a rare risk of potassium elevation with this combination. Have never run across this in my practice over the past 38 years. Short term use probably not a problem but the podiatrist probably has another option for this antibiotic.

## 2016-09-19 NOTE — Telephone Encounter (Signed)
Calling about interaction with a couple of pt's medications one of which was prescribed by Dr. Amalia Hailey. Requested a call back at [phone].

## 2016-09-19 NOTE — Telephone Encounter (Signed)
[name] with [facility] advised, [name] states she tried to contact dr Amalia Hailey brent yesterday and after 30 minutes of been transferred to different location she was not able to get anyone to answer her. I called Dr Amalia Hailey office and left a voicemail on nurses voicemail they had, waiting to hear back to see if doctor wants to change Septra to something else. [name] needs to be advised when we hear back-aa I called [phone], also [name] gave another number if needed [phone] to contact dr Beatriz Stallion

## 2016-09-19 NOTE — Telephone Encounter (Signed)
Spoke to [employer] and advised him that I have not heard from Dr Amalia Hailey office still. Per Simona Huh at this time it is ok for patient to take Septra. She also took it in May 2018 for short term and she was on Lisinopril too at that time. Follow if needed. Clinton Gallant from [facility] and advised her if we hear anything different from Dr Amalia Hailey office we will let her know. Elane Fritz Graylin Shiver

## 2016-09-24 ENCOUNTER — Telehealth: Payer: Self-pay | Admitting: Podiatry

## 2016-09-24 ENCOUNTER — Telehealth: Payer: Self-pay | Admitting: *Deleted

## 2016-09-24 NOTE — Telephone Encounter (Signed)
"  I'm calling on behalf of my mother.  She's scheduled to have surgery on Thursday, July 26.  She has decided to hold off on that surgery.  We went for a second opinion.  So we have decided to try some alternative methods to see if it can heal.  We may decide later to proceed with surgery but for now we want to hold off."  I will let Dr. Amalia Hailey know and cancel it at the surgical center.

## 2016-09-24 NOTE — Telephone Encounter (Signed)
Pt's daughter Emeline Gins called and stated she took her mother for a second opinion yesterday before she has her surgery next week. Stated they want to cancel the surgery for now because they are going to try and save her toe. Stated she would contact the surgical center to cancel the surgery. Said if needed, they would call back to reschedule the surgery if they needed to in the future.

## 2016-09-25 NOTE — Telephone Encounter (Signed)
I left [name] a message to cancel surgery for July 26.  Patient got a second opinion and wants to hold off for now.

## 2016-10-10 ENCOUNTER — Encounter: Payer: Medicare Other | Admitting: Podiatry

## 2016-10-15 ENCOUNTER — Other Ambulatory Visit: Payer: Self-pay | Admitting: Family Medicine

## 2016-10-15 DIAGNOSIS — R609 Edema, unspecified: Secondary | ICD-10-CM

## 2016-10-17 ENCOUNTER — Encounter: Payer: Medicare Other | Admitting: Podiatry

## 2016-10-23 ENCOUNTER — Other Ambulatory Visit: Payer: Self-pay | Admitting: Gastroenterology

## 2016-11-11 LAB — HM DIABETES FOOT EXAM

## 2016-11-11 IMAGING — US US CAROTID DUPLEX BILAT
1 series · 14 of 24 positions shown · non-contrast
Comparison: None.

CLINICAL DATA: Syncope.

EXAM:
BILATERAL CAROTID DUPLEX ULTRASOUND
TECHNIQUE: Gray scale imaging, color Doppler and duplex ultrasound were
performed of bilateral carotid and vertebral arteries in the neck.

[Series 1: us carotid duplex bilat · 0.07mm/px · 14 of 67 slices shown]
[im 1/67]
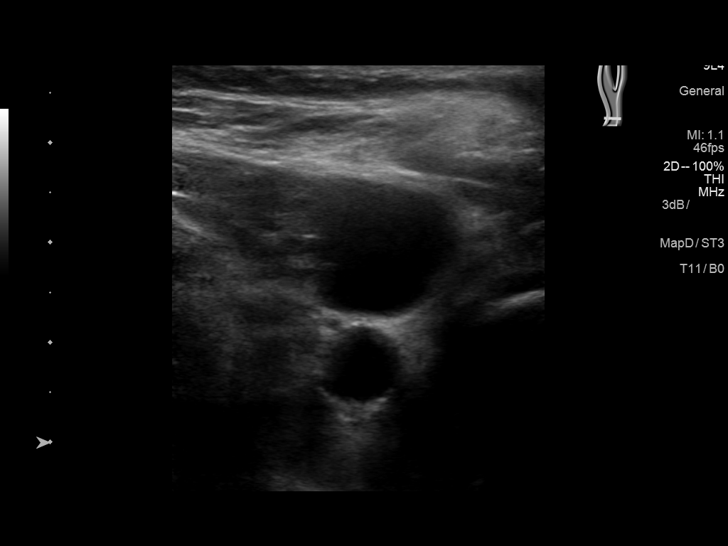
[im 6/67]
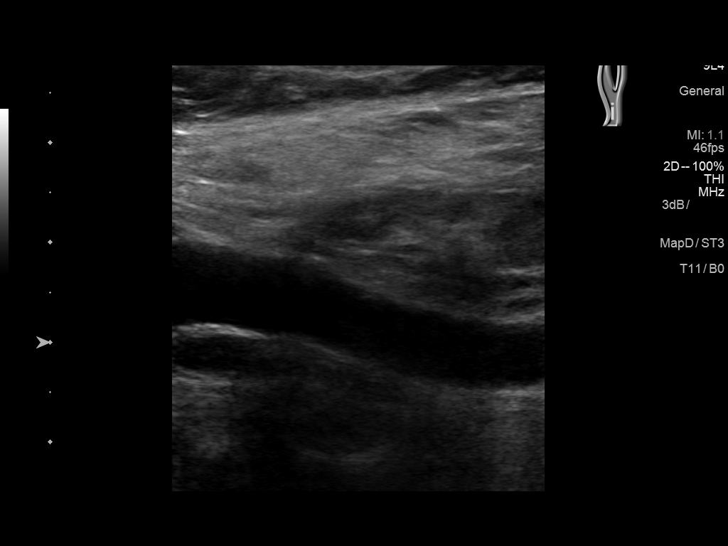
[im 12/67]
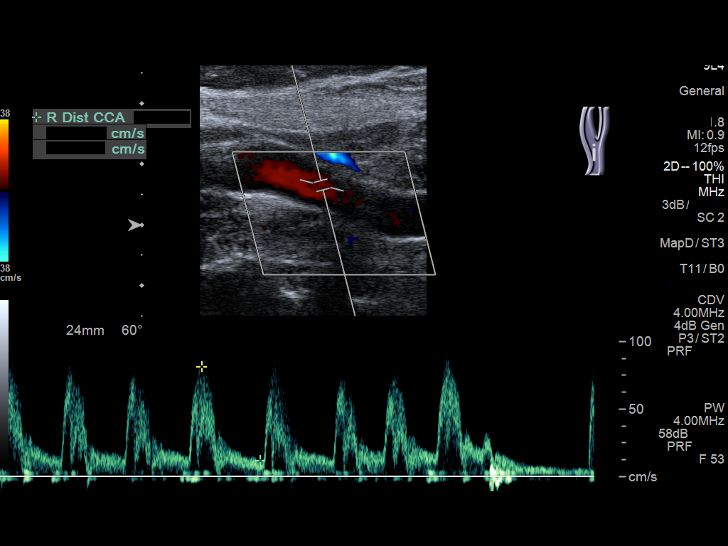
[im 18/67]
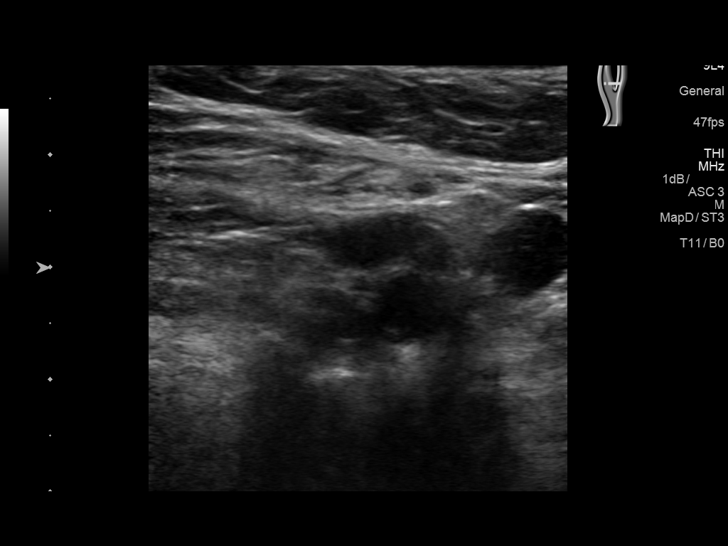
[im 21/67]
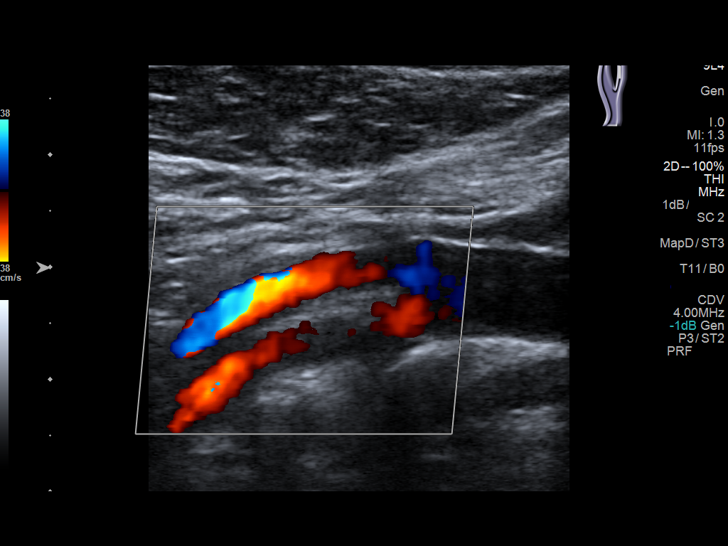
[im 26/67]
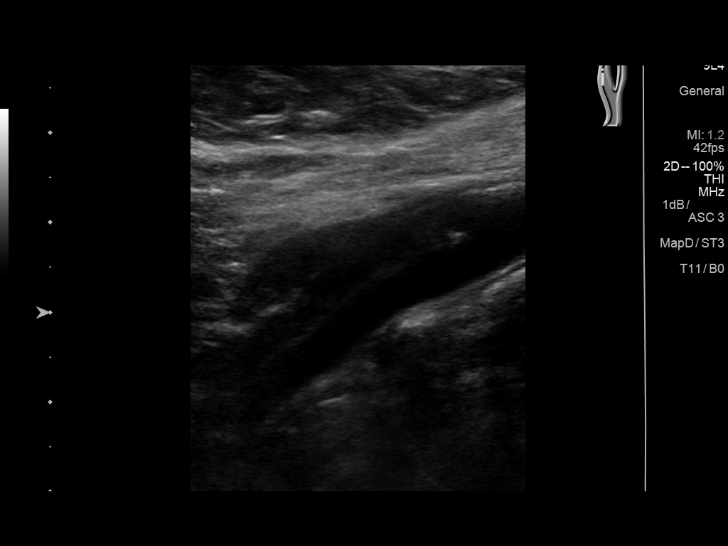
[im 32/67]
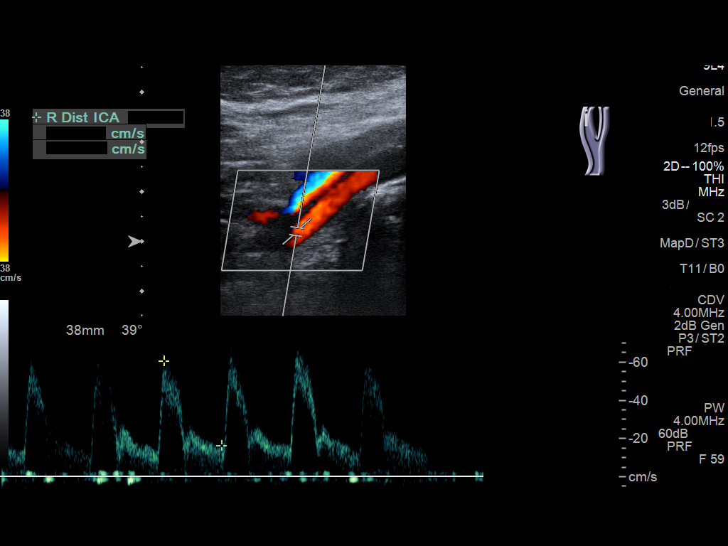
[im 35/67]
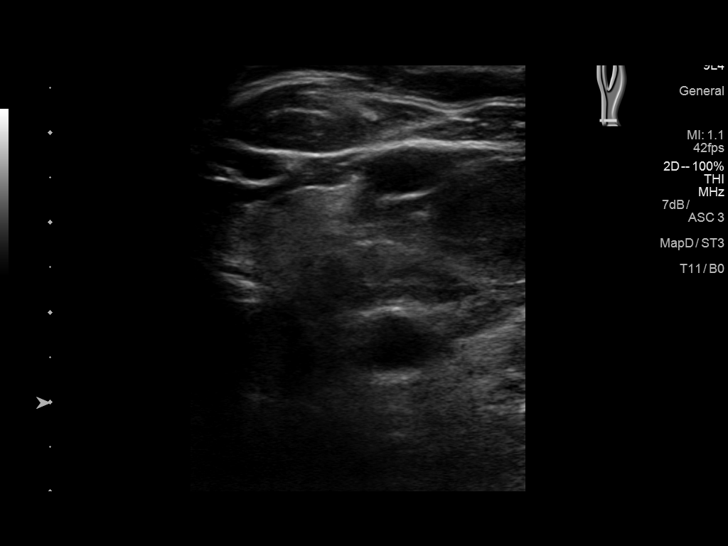
[im 41/67]
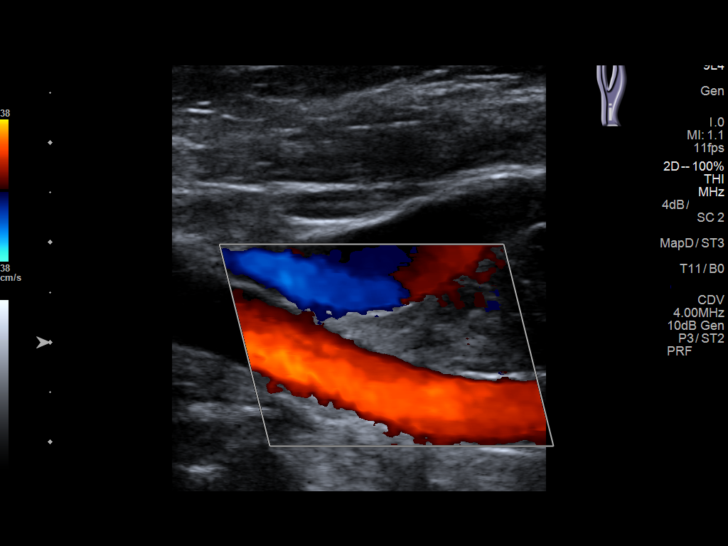
[im 46/67]
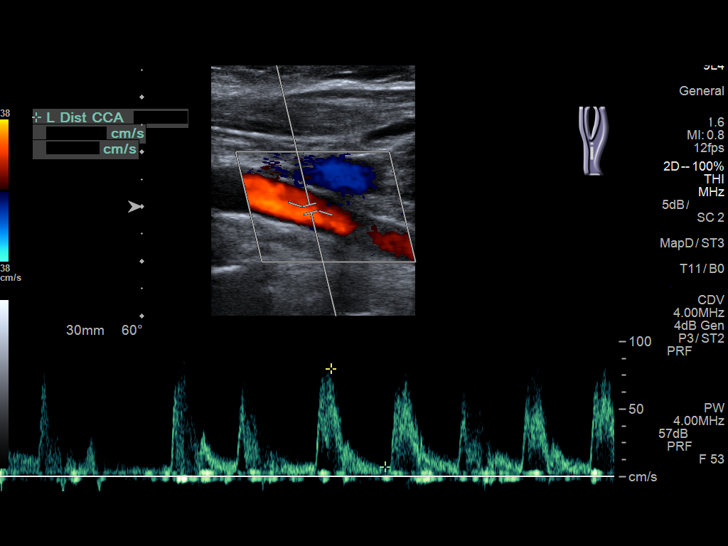
[im 52/67]
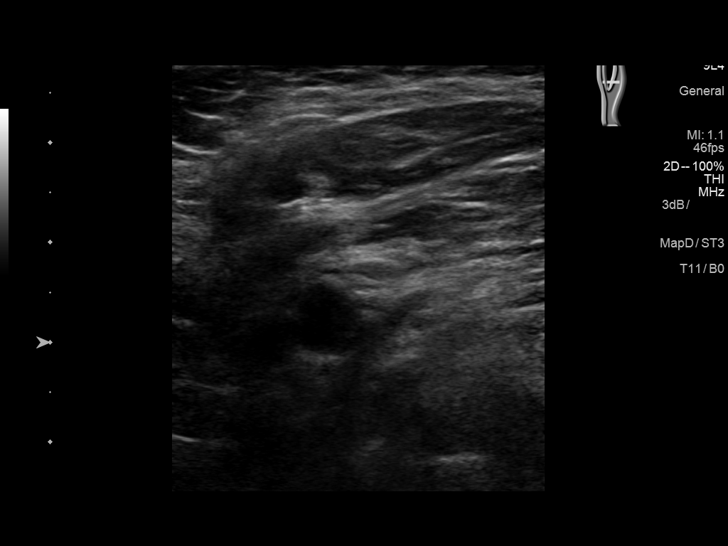
[im 55/67]
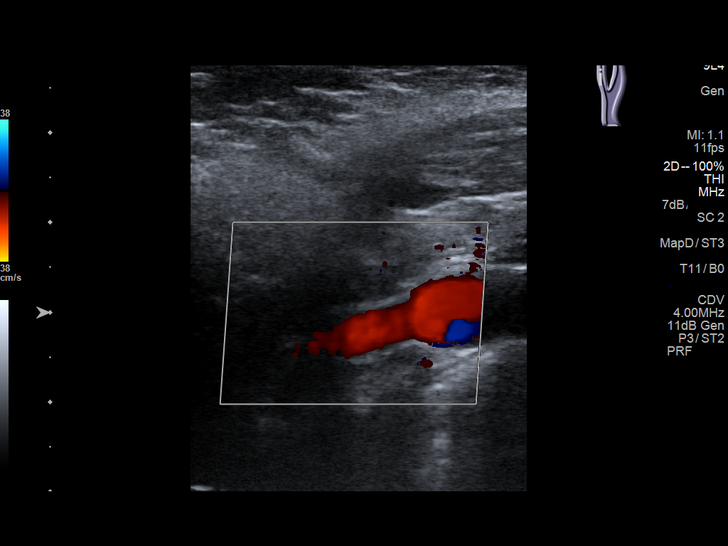
[im 61/67]
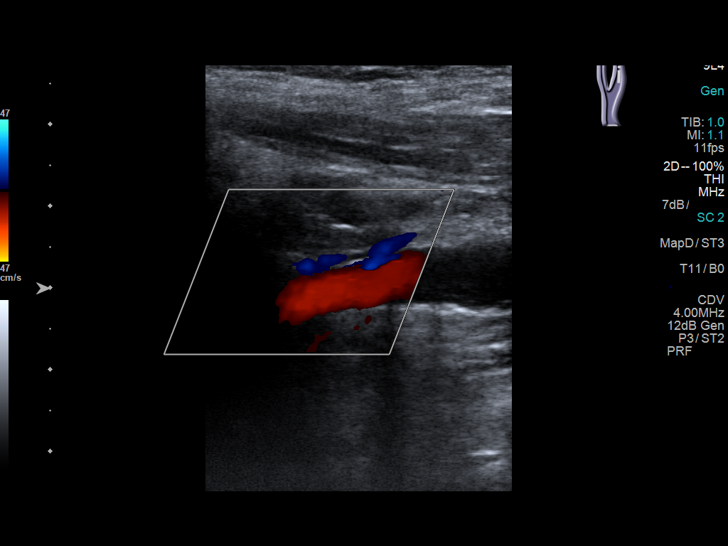
[im 67/67]
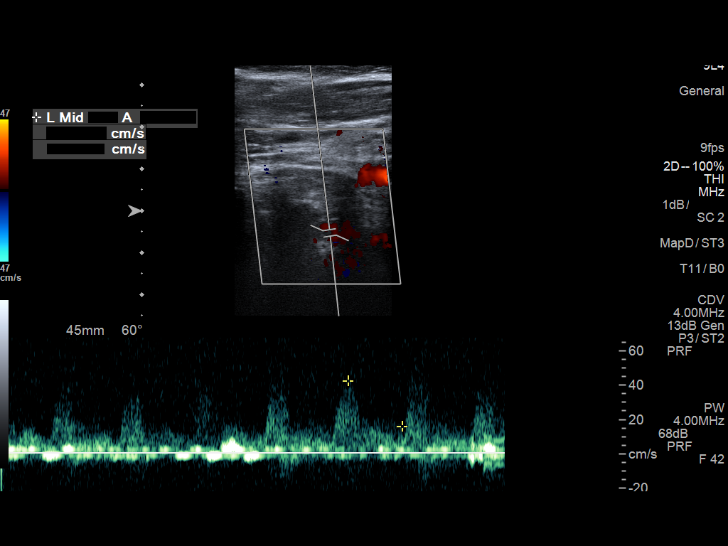

[14 of 24 positions shown; findings below may reference images not displayed]

FINDINGS: Criteria: Quantification of carotid stenosis is based on velocity
parameters that correlate the residual internal carotid diameter
with NASCET-based stenosis levels, using the diameter of the distal
internal carotid lumen as the denominator for stenosis measurement.

The following velocity measurements were obtained:

RIGHT

ICA:  65/13 cm/sec

CCA:  82/12 cm/sec

SYSTOLIC ICA/CCA RATIO:

DIASTOLIC ICA/CCA RATIO:

ECA:  124 cm/sec

LEFT

ICA:  80/20 cm/sec

CCA:  80/7 cm/sec

SYSTOLIC ICA/CCA RATIO:

DIASTOLIC ICA/CCA RATIO:

ECA:  85 cm/sec

RIGHT CAROTID ARTERY: No significant plaque or stenosis is noted.

RIGHT VERTEBRAL ARTERY:  Antegrade flow is noted.

LEFT CAROTID ARTERY:  No significant plaque or stenosis is noted.

LEFT VERTEBRAL ARTERY:  Antegrade flow is noted.
IMPRESSION: No hemodynamically significant stenosis or plaque is noted in either
cervical carotid artery.

## 2016-11-27 ENCOUNTER — Other Ambulatory Visit: Payer: Self-pay | Admitting: Family Medicine

## 2016-11-27 DIAGNOSIS — I10 Essential (primary) hypertension: Secondary | ICD-10-CM

## 2016-11-27 NOTE — Telephone Encounter (Signed)
[name] faxed a refill request on the following medications:  lisinopril (PRINIVIL,ZESTRIL) 5 MG tablet.  Take one tablet by mouth daily.    [name] [place]/MW

## 2016-11-28 MED ORDER — LISINOPRIL 5 MG PO TABS: 5.0000 mg | ORAL_TABLET | Freq: Every day | ORAL | 3 refills | Status: DC

## 2016-12-01 ENCOUNTER — Encounter: Payer: Self-pay | Admitting: Family Medicine

## 2016-12-01 ENCOUNTER — Ambulatory Visit (INDEPENDENT_AMBULATORY_CARE_PROVIDER_SITE_OTHER): Payer: Medicare Other | Admitting: Family Medicine

## 2016-12-01 VITALS — BP 136/84 | HR 59 | Temp 97.7°F | Wt 184.2 lb

## 2016-12-01 DIAGNOSIS — Z23 Encounter for immunization: Secondary | ICD-10-CM | POA: Diagnosis not present

## 2016-12-01 DIAGNOSIS — E7800 Pure hypercholesterolemia, unspecified: Secondary | ICD-10-CM

## 2016-12-01 DIAGNOSIS — I10 Essential (primary) hypertension: Secondary | ICD-10-CM

## 2016-12-01 DIAGNOSIS — I480 Paroxysmal atrial fibrillation: Secondary | ICD-10-CM | POA: Diagnosis not present

## 2016-12-01 DIAGNOSIS — E1100 Type 2 diabetes mellitus with hyperosmolarity without nonketotic hyperglycemic-hyperosmolar coma (NKHHC): Secondary | ICD-10-CM

## 2016-12-01 MED ORDER — LISINOPRIL 5 MG PO TABS: 5.0000 mg | ORAL_TABLET | Freq: Every day | ORAL | 3 refills | Status: DC

## 2016-12-01 NOTE — Progress Notes (Signed)
[name]    DOB: 1931-05-31   83 y.o.   MRN: [id number] Visit Date: 12/01/2016  Today's Provider: [name], PA   Chief Complaint  Patient presents with  . Diabetes  . Hyperlipidemia  . Hypertension  . Follow-up   Subjective:    HPI  Diabetes Mellitus Type II, Follow-up:   Lab Results  Component Value Date   HGBA1C 8.5 (H) 07/23/2016   HGBA1C 8.0 (H) 04/30/2016   HGBA1C 7.1 (H) 01/22/2016   Last seen for diabetes 3 months ago.  Management since then includes continue medications She reports good compliance with treatment. She is not having side effects.  Current symptoms include none  Home blood sugar records: patient does not check FBS often   Current Insulin Regimen: none Most Recent Eye Exam: 02/14/2016 Weight trend: stable Current diet: in general, an "unhealthy" diet Current exercise: no regular exercise  ------------------------------------------------------------------------   Hypertension, follow-up:  BP Readings from Last 3 Encounters:  12/01/16 136/84  09/17/16 (!) 150/83  08/28/16 (!) 92/52    She was last seen for hypertension 3 months ago.  BP at that visit was 92/52. Management since that visit includes decrease Lisinopril to 5 mg.She reports good compliance with treatment. She is not having side effects.  She is not exercising. She is not adherent to low salt diet.   Outside blood pressures are being checked occasionally. She is experiencing none.  Patient denies chest pain, chest pressure/discomfort, irregular heart beat and palpitations.   Cardiovascular risk factors include advanced age (older than 7 for men, 21 for women), diabetes mellitus, dyslipidemia, hypertension and obesity (BMI >= 30 kg/m2).  Use of agents associated with hypertension: none.   ------------------------------------------------------------------------    Lipid/Cholesterol, Follow-up:   Last seen for this 3 months ago.  Management  since that visit includes continue medication.  Last Lipid Panel:    Component Value Date/Time   CHOL 151 07/23/2016 0925   TRIG 103 07/23/2016 0925   HDL 57 07/23/2016 0925   CHOLHDL 2.6 07/23/2016 0925   LDLCALC 73 07/23/2016 0925    She reports good compliance with treatment. She is not having side effects.   Wt Readings from Last 3 Encounters:  12/01/16 184 lb 3.2 oz (83.6 kg)  09/17/16 186 lb 4 oz (84.5 kg)  08/28/16 185 lb 9.6 oz (84.2 kg)    ------------------------------------------------------------------------ Past Medical History:  Diagnosis Date  . Diabetes mellitus without complication ([place])   . Diverticulitis   . History of echocardiogram    a. 11/2015: echo showing EF of 60-65% with no WMA. Grade 1 DD and mild MR noted.   . Hypertension   . PAF (paroxysmal atrial fibrillation) ([place])    a. initially diagnosed in 08/2016 --> started on Eliquis  . Syncope    a. initially occurring in Fall 2016 b. Hospitalized in 10/2015 and 11/2015 for recurrent episodes.    Past Surgical History:  Procedure Laterality Date  . ABDOMINAL HYSTERECTOMY    . BACK SURGERY  08/08/2008   Lumbar discectomy  . NECK SURGERY  1980   Family History  Problem Relation Age of Onset  . Hypertension Mother   . Diabetes Mother   . Lung cancer Father    Allergies  Allergen Reactions  . Morphine Sulfate Other (See Comments)    BP bottoms out  . Penicillins Diarrhea    Has patient had a PCN reaction causing immediate rash, facial/tongue/throat swelling, SOB or lightheadedness with hypotension: Yes Has  patient had a PCN reaction causing severe rash involving mucus membranes or skin necrosis: Yes Has patient had a PCN reaction that required hospitalization No Has patient had a PCN reaction occurring within the last 10 years: No If all of the above answers are "NO", then may proceed with Cephalosporin use.     Previous Medications   APIXABAN (ELIQUIS) 5 MG TABS TABLET    Take 1 tablet  (5 mg total) by mouth 2 (two) times daily.   ASPIRIN 81 MG TABLET    Take 81 mg by mouth daily.    BUDESONIDE (ENTOCORT EC) 3 MG 24 HR CAPSULE    TAKE THREE CAPSULES BY MOUTH DAILY   CHOLECALCIFEROL (VITAMIN D) 2000 UNITS CAPS    Take 1 capsule by mouth daily.    FUROSEMIDE (LASIX) 20 MG TABLET    TAKE ONE TABLET BY MOUTH DAILY AS NEEDED   GENTAMICIN CREAM (GARAMYCIN) 0.1 %    Apply 1 application topically 3 (three) times daily.   GLIPIZIDE (GLUCOTROL XL) 5 MG 24 HR TABLET    Take 1 tablet (5 mg total) by mouth daily with breakfast.   LISINOPRIL (PRINIVIL,ZESTRIL) 5 MG TABLET    Take 1 tablet (5 mg total) by mouth daily.   METFORMIN (GLUCOPHAGE) 500 MG TABLET    1 tablet in the morning and 2 tablets in the evening   METOPROLOL SUCCINATE (TOPROL XL) 25 MG 24 HR TABLET    Take 1 tablet (25 mg total) by mouth daily.   MULTIPLE VITAMIN TABLET    Take 1 tablet by mouth every evening. Reported on 03/13/2015   SIMVASTATIN (ZOCOR) 20 MG TABLET    Take 1 tablet (20 mg total) by mouth at bedtime.    Review of Systems  Constitutional: Positive for fatigue.  Respiratory: Negative.   Cardiovascular: Negative.   Gastrointestinal: Negative.   Endocrine: Negative.   Genitourinary: Negative.   Musculoskeletal: Negative.     Social History  Substance Use Topics  . Smoking status: Former Smoker    Quit date: 03/09/1978  . Smokeless tobacco: Never Used  . Alcohol use 0.6 oz/week    1 Glasses of wine per week     Comment: none since hospitalization   Objective:   BP 136/84 (BP Location: Right Arm, Patient Position: Sitting, Cuff Size: Normal)   Pulse (!) 59   Temp 97.7 F (36.5 C) (Oral)   Wt 184 lb 3.2 oz (83.6 kg)   SpO2 98%   BMI 33.69 kg/m   Physical Exam  Constitutional: She is oriented to person, place, and time. She appears well-developed and well-nourished. No distress.  HENT:  Head: Normocephalic and atraumatic.  Right Ear: Hearing normal.  Left Ear: Hearing normal.  Nose: Nose  normal.  Eyes: Conjunctivae and lids are normal. Right eye exhibits no discharge. Left eye exhibits no discharge. No scleral icterus.  Neck: Neck supple.  Cardiovascular: Normal rate and regular rhythm.   Pulmonary/Chest: Effort normal. No respiratory distress.  Musculoskeletal: Normal range of motion.  Neurological: She is alert and oriented to person, place, and time.  Skin: Skin is intact. No lesion and no rash noted.  Psychiatric: She has a normal mood and affect. Her speech is normal and behavior is normal. Thought content normal.   Diabetic Foot Exam - Simple   Simple Foot Form Diabetic Foot exam was performed with the following findings:  Yes 12/01/2016 11:10 AM  Visual Inspection See comments:  Yes Sensation Testing Intact to touch and monofilament  testing bilaterally:  Yes Pulse Check See comments:  Yes Comments Calluses on the tip of both 3rd toes. No open sores or corns today. Normal sensation bilaterally by monofilament. Pulses 2+ left foot 1+ on the right. Continues routine follow up with podiatrist (Dr. Elvina Mattes).       Assessment & Plan:     1. Type 2 diabetes mellitus with hyperosmolarity without coma, without long-term current use of insulin (HCC) No hypoglycemic episodes on the Glipizide 5 mg qd and Metformin 500 mg BID (decided to stop the extra dose in the evenings). Weight is stable and no polyuria, polydipsia or vision changes. Last eye exam was 02-14-16 and followed by Dr. Elvina Mattes (podiatrist) with calluses and history of osteomyelitis (no amputation required). Recheck CBC, CMP, Lipid Panel and Hgb A1C. Follow up pending reports. - CBC with Differential/Platelet - Comprehensive metabolic panel - Lipid panel - Hemoglobin A1c  2. Essential (primary) hypertension Stable on the Lisinopril 5 mg qd with Metoprolol succinate 25 mg qd. Intermittent use of Furosemide 20 mg qd prn peripheral edema. No dyspnea, palpitations or chest pains today. Recheck routine labs. -  lisinopril (PRINIVIL,ZESTRIL) 5 MG tablet; Take 1 tablet (5 mg total) by mouth daily.  Dispense: 90 tablet; Refill: 3 - CBC with Differential/Platelet - Comprehensive metabolic panel - Lipid panel  3. Hypercholesteremia Weight stable. Trying to follow the low fat diet and takes the Simvastatin 20 mg qd without side effects. Recheck labs and follow up pending reports. - Comprehensive metabolic panel - Lipid panel  4. Paroxysmal atrial fibrillation (HCC) Well controlled without irregularity today. No chest pains. Followed by Dr. Stephens November (cardiologist) regularly. Still taking the Metoprolol Succinate 25 mg qd and Eliquis 5 mg qd. Stopped taking the ASA.  5. Need for influenza vaccination - Flu vaccine HIGH DOSE PF

## 2016-12-03 LAB — COMPREHENSIVE METABOLIC PANEL
ALBUMIN: 4 g/dL (ref 3.5–4.7)
ALT: 20 IU/L (ref 0–32)
AST: 17 IU/L (ref 0–40)
Albumin/Globulin Ratio: 1.9 (ref 1.2–2.2)
Alkaline Phosphatase: 45 IU/L (ref 39–117)
BUN/Creatinine Ratio: 26 (ref 12–28)
BUN: 24 mg/dL (ref 8–27)
Bilirubin Total: 0.5 mg/dL (ref 0.0–1.2)
CALCIUM: 9.5 mg/dL (ref 8.7–10.3)
CO2: 26 mmol/L (ref 20–29)
CREATININE: 0.94 mg/dL (ref 0.57–1.00)
Chloride: 105 mmol/L (ref 96–106)
GFR, EST AFRICAN AMERICAN: 65 mL/min/{1.73_m2} (ref >59)
GFR, EST NON AFRICAN AMERICAN: 56 mL/min/{1.73_m2} — AB (ref >59)
GLOBULIN, TOTAL: 2.1 g/dL (ref 1.5–4.5)
Glucose: 112 mg/dL — ABNORMAL HIGH (ref 65–99)
Potassium: 4.2 mmol/L (ref 3.5–5.2)
SODIUM: 146 mmol/L — AB (ref 134–144)
TOTAL PROTEIN: 6.1 g/dL (ref 6.0–8.5)

## 2016-12-03 LAB — CBC WITH DIFFERENTIAL/PLATELET
BASOS: 0 %
Basophils Absolute: 0 10*3/uL (ref 0.0–0.2)
EOS (ABSOLUTE): 0.2 10*3/uL (ref 0.0–0.4)
EOS: 3 %
HEMATOCRIT: 37.6 % (ref 34.0–46.6)
HEMOGLOBIN: 12.7 g/dL (ref 11.1–15.9)
IMMATURE GRANS (ABS): 0 10*3/uL (ref 0.0–0.1)
IMMATURE GRANULOCYTES: 0 %
LYMPHS: 39 %
Lymphocytes Absolute: 2.6 10*3/uL (ref 0.7–3.1)
MCH: 30.3 pg (ref 26.6–33.0)
MCHC: 33.8 g/dL (ref 31.5–35.7)
MCV: 90 fL (ref 79–97)
Monocytes Absolute: 0.7 10*3/uL (ref 0.1–0.9)
Monocytes: 11 %
NEUTROS PCT: 47 %
Neutrophils Absolute: 3.1 10*3/uL (ref 1.4–7.0)
Platelets: 239 10*3/uL (ref 150–379)
RBC: 4.19 x10E6/uL (ref 3.77–5.28)
RDW: 13.2 % (ref 12.3–15.4)
WBC: 6.7 10*3/uL (ref 3.4–10.8)

## 2016-12-03 LAB — LIPID PANEL
CHOL/HDL RATIO: 3 ratio (ref 0.0–4.4)
Cholesterol, Total: 140 mg/dL (ref 100–199)
HDL: 47 mg/dL (ref >39)
LDL Calculated: 67 mg/dL (ref 0–99)
TRIGLYCERIDES: 132 mg/dL (ref 0–149)
VLDL Cholesterol Cal: 26 mg/dL (ref 5–40)

## 2016-12-03 LAB — HEMOGLOBIN A1C
ESTIMATED AVERAGE GLUCOSE: 137 mg/dL
Hgb A1c MFr Bld: 6.4 % — ABNORMAL HIGH (ref 4.8–5.6)

## 2016-12-04 ENCOUNTER — Telehealth: Payer: Self-pay

## 2016-12-04 NOTE — Telephone Encounter (Signed)
-----   Message from Margo Common, [place] sent at 12/04/2016  8:06 AM EDT ----- Very good Hgb A1C indicates diabetes in control. Cholesterol perfect and normal kidney function. Sodium slightly up - probably need to drink a little more water and be sure not to add any extra salt to diet. Recheck diabetes in 6 months.

## 2016-12-04 NOTE — Telephone Encounter (Signed)
Patient advised.KW 

## 2016-12-24 ENCOUNTER — Encounter: Payer: Self-pay | Admitting: Internal Medicine

## 2016-12-24 ENCOUNTER — Ambulatory Visit (INDEPENDENT_AMBULATORY_CARE_PROVIDER_SITE_OTHER): Payer: Medicare Other | Admitting: Internal Medicine

## 2016-12-24 VITALS — BP 142/74 | HR 59 | Ht 61.5 in | Wt 181.8 lb

## 2016-12-24 DIAGNOSIS — I10 Essential (primary) hypertension: Secondary | ICD-10-CM | POA: Diagnosis not present

## 2016-12-24 DIAGNOSIS — R600 Localized edema: Secondary | ICD-10-CM

## 2016-12-24 DIAGNOSIS — I480 Paroxysmal atrial fibrillation: Secondary | ICD-10-CM | POA: Diagnosis not present

## 2016-12-24 DIAGNOSIS — R5383 Other fatigue: Secondary | ICD-10-CM | POA: Diagnosis not present

## 2016-12-24 DIAGNOSIS — R531 Weakness: Secondary | ICD-10-CM | POA: Insufficient documentation

## 2016-12-24 NOTE — Progress Notes (Signed)
Follow-up Outpatient Visit Date: 12/24/2016  Primary Care Provider: Margo Common, [place] [id number]  Chief Complaint: Follow-up paroxysmal atrial fibrillation  HPI:  Ms. Brinkmeier is a 83 y.o. year-old female with history of paroxysmal atrial fibrillation, hypertension, hyperlipidemia, diabetes mellitus, and recurrent syncope (thought to be due to vasovagal mechanism), who presents for follow-up of atrial fibrillation. She was last seen in our office in July after having been diagnosed with atrial fibrillation one month prior. At her last visit, she was doing well with the exception of shortness of breath when putting on her compression stockings. EKG showed normal sinus rhythm. Metoprolol succinate was increased from 12.5 mg daily to 25 mg daily.  Today, Ms. Mobley reports feeling well with the exception of some fatigue. She occasionally takes naps during the day but is otherwise able to carry out her usual activities without any significant weakness or excessive fatigue. She denies chest pain, shortness of breath, palpitations, lightheadedness, and syncope/falls. She bruises easily but has not had any significant bleeding, remaining compliant with apixaban. She is currently using furosemide every other day, which seems to be controlling her edema well.  --------------------------------------------------------------------------------------------------  Cardiovascular History & Procedures: Cardiovascular Problems:  Paroxysmal atrial fibrillation  Syncope  Risk Factors:  Diabetes mellitus, hypertension, and age  Cath/PCI:  None.  CV Surgery:  None.  EP Procedures and Devices:  30-day event monitor (12/2015): Predominant rhythm was sinus with an average rate 60 bpm (range 45-134 bpm). Occasional PACs and PVCs were noted. There were 2 episodes of superventricular tachycardia lasting up to 14 beats with a maximum rate of 136 bpm. No sustained  arrhythmias were identified. Symptoms of feeling "tired and fatigued" we'll corresponded to sinus rhythm and sinus rhythm with PACs.  Non-Invasive Evaluation(s):  ABI's (09/15/16): Normal (1.1 bilaterally)  Pharmacologic myocardial perfusion stress test (01/08/16): Low risk study with small apical anterior and apical defect more pronounced on the rest images consistent with shifting breast attenuation. No ischemia identified. Normal LVEF (55-65%).  Transthoracic echocardiogram (11/21/15): Normal LV size and function (EF 60-65%) with grade 1 diastolic dysfunction. Mild MR. Normal RV size and function. Normal pulmonary artery pressure.  Carotid Doppler (11/22/15): No hemodynamically significant plaque or stenosis in either cervical carotid artery.  Recent CV Pertinent Labs: Lab Results  Component Value Date   CHOL 140 12/02/2016   HDL 47 12/02/2016   LDLCALC 67 12/02/2016   TRIG 132 12/02/2016   CHOLHDL 3.0 12/02/2016   K 4.2 12/02/2016   MG 1.8 08/13/2016   BUN 24 12/02/2016   CREATININE 0.94 12/02/2016    Past medical and surgical history were reviewed and updated in EPIC.  Current Meds  Medication Sig  . apixaban (ELIQUIS) 5 MG TABS tablet Take 1 tablet (5 mg total) by mouth 2 (two) times daily.  . budesonide (ENTOCORT EC) 3 MG 24 hr capsule TAKE THREE CAPSULES BY MOUTH DAILY  . Cholecalciferol (VITAMIN D) 2000 UNITS CAPS Take 1 capsule by mouth daily.   . furosemide (LASIX) 20 MG tablet TAKE ONE TABLET BY MOUTH DAILY AS NEEDED  . glipiZIDE (GLUCOTROL XL) 5 MG 24 hr tablet Take 1 tablet (5 mg total) by mouth daily with breakfast.  . lisinopril (PRINIVIL,ZESTRIL) 5 MG tablet Take 1 tablet (5 mg total) by mouth daily.  . metFORMIN (GLUCOPHAGE) 500 MG tablet 1 tablet in the morning and 2 tablets in the evening (Patient taking differently: 1 tablet in the morning and 1 tablets in the evening)  .  metoprolol succinate (TOPROL XL) 25 MG 24 hr tablet Take 1 tablet (25 mg total) by mouth  daily.  . Multiple Vitamin tablet Take 1 tablet by mouth every evening. Reported on 03/13/2015  . simvastatin (ZOCOR) 20 MG tablet Take 1 tablet (20 mg total) by mouth at bedtime.    Allergies: Morphine sulfate and Penicillins  Social History   Social History  . Marital status: Widowed    Spouse name: N/A  . Number of children: N/A  . Years of education: N/A   Occupational History  . Not on file.   Social History Main Topics  . Smoking status: Former Smoker    Quit date: 03/09/1978  . Smokeless tobacco: Never Used  . Alcohol use 0.6 oz/week    1 Glasses of wine per week     Comment: none since hospitalization  . Drug use: No  . Sexual activity: Not on file   Other Topics Concern  . Not on file   Social History Narrative  . No narrative on file    Family History  Problem Relation Age of Onset  . Hypertension Mother   . Diabetes Mother   . Lung cancer Father     Review of Systems: A 12-system review of systems was performed and was negative except as noted in the HPI.  --------------------------------------------------------------------------------------------------  Physical Exam: BP (!) 142/74 (BP Location: Left Arm, Patient Position: Sitting, Cuff Size: Normal)   Pulse (!) 59   Ht 5' 1.5" (1.562 m)   Wt 181 lb 12 oz (82.4 kg)   BMI 33.79 kg/m   General:  Obese, elderly woman, seated comfortably in the exam room. She is accompanied by her daughter. HEENT: No conjunctival pallor or scleral icterus. Moist mucous membranes.  OP clear. Neck: Supple without lymphadenopathy, thyromegaly, JVD, or HJR. Lungs: Normal work of breathing. Clear to auscultation bilaterally without wheezes or crackles. Heart: Regular rate and rhythm without murmurs, rubs, or gallops. Unable to assess PMI due to body habitus. Abd: Bowel sounds present. Soft, NT/ND without hepatosplenomegaly Ext: Trace ankle edema bilaterally.  Skin: Warm and dry without rash.  EKG:  Sinus bradycardia  (HR 59 bpm) and low voltage. Heart rate has decreased since 09/17/16. Otherwise, there has been no significant interval change.  Lab Results  Component Value Date   WBC 6.7 12/02/2016   HGB 12.7 12/02/2016   HCT 37.6 12/02/2016   MCV 90 12/02/2016   PLT 239 12/02/2016    Lab Results  Component Value Date   NA 146 (H) 12/02/2016   K 4.2 12/02/2016   CL 105 12/02/2016   CO2 26 12/02/2016   BUN 24 12/02/2016   CREATININE 0.94 12/02/2016   GLUCOSE 112 (H) 12/02/2016   ALT 20 12/02/2016    Lab Results  Component Value Date   CHOL 140 12/02/2016   HDL 47 12/02/2016   LDLCALC 67 12/02/2016   TRIG 132 12/02/2016   CHOLHDL 3.0 12/02/2016    --------------------------------------------------------------------------------------------------  ASSESSMENT AND PLAN: Paroxysmal atrial fibrillations No symptoms to suggest recurrence. Mild bradycardia noted today without significant symptoms. Chronic fatigue is non-specific and does not seem to have worsened with increased dose of metoprolol. We will continue current dose of metoprolol succinate 25 mg daily as well as indefinite apixaban, given CHADSVASC score of at least 5.  Hypertension Blood pressure mildly elevated. We will tolerate a small degree of permissive hypertension in order to avoid lightheadedness/fall. I encouraged Ms. Art to limit her salt intake.  Lower extremity edema  Trace swelling on exam today. Overall, edema seems to be adequately controlled with furosemide. Weight is also down 5 pounds since July. No medication changes today.  Fatigue This is likely multifactorial. I encouraged Ms. Stannard to speak with her PCP about possible physical therapy evaluation. She may also benefit from joining [facility] [employer] if she does not qualify for PT or after completing formal physical therapy program.  Follow-up: Return to clinic in 4 months.  Nelva Bush, MD 12/24/2016 10:27 AM

## 2016-12-24 NOTE — Patient Instructions (Signed)
Medication Instructions:  Your physician recommends that you continue on your current medications as directed. Please refer to the Current Medication list given to you today.  Labwork: none  Testing/Procedures: none  Follow-Up: Your physician wants you to follow-up in: 4 months.  You will receive a reminder letter in the mail two months in advance. If you don't receive a letter, please call our office to schedule the follow-up appointment.   Any Other Special Instructions Will Be Listed Below (If Applicable).     If you need a refill on your cardiac medications before your next appointment, please call your pharmacy.   

## 2017-04-13 ENCOUNTER — Ambulatory Visit: Payer: Medicare Other | Admitting: Family Medicine

## 2017-04-14 ENCOUNTER — Ambulatory Visit: Payer: Medicare Other | Admitting: Family Medicine

## 2017-04-14 ENCOUNTER — Encounter: Payer: Self-pay | Admitting: Family Medicine

## 2017-04-14 VITALS — BP 122/64 | HR 61 | Temp 97.7°F | Wt 168.8 lb

## 2017-04-14 DIAGNOSIS — E7800 Pure hypercholesterolemia, unspecified: Secondary | ICD-10-CM | POA: Diagnosis not present

## 2017-04-14 DIAGNOSIS — I480 Paroxysmal atrial fibrillation: Secondary | ICD-10-CM | POA: Diagnosis not present

## 2017-04-14 DIAGNOSIS — E1100 Type 2 diabetes mellitus with hyperosmolarity without nonketotic hyperglycemic-hyperosmolar coma (NKHHC): Secondary | ICD-10-CM

## 2017-04-14 DIAGNOSIS — I10 Essential (primary) hypertension: Secondary | ICD-10-CM | POA: Diagnosis not present

## 2017-04-14 NOTE — Progress Notes (Signed)
[name]    DOB: 02-28-1932   84 y.o.   MRN: [id number] Visit Date: 04/14/2017  Today's Provider: [name], PA   Chief Complaint  Patient presents with  . Diabetes  . Hyperlipidemia  . Hypertension  . Follow-up   Subjective:      HPI  Diabetes Mellitus Type II, Follow-up:   RecentLabs   Lab Results  Component Value Date   CHOL 140 12/02/2016   HDL 47 12/02/2016   LDLCALC 67 12/02/2016   TRIG 132 12/02/2016   CHOLHDL 3.0 12/02/2016    Last seen for diabetes 4 months ago.  Management since then includes continue medications She reports good compliance with treatment. She is not having side effects.  Current symptoms include none  Home blood sugar records: patient does not check FBS often              Current Insulin Regimen: none Most Recent Eye Exam: 02/14/2016 Weight trend: stable Current diet: loss of appetite, some weight loss since OV Current exercise: no regular exercise  Lab Results  Component Value Date   HGBA1C 6.4 (H) 12/02/2016    ------------------------------------------------------------------------   Hypertension, follow-up:     BP Readings from Last 3 Encounters:  12/01/16 136/84  09/17/16 (!) 150/83  08/28/16 (!) 92/52    She was last seen for hypertension 4 months ago.  BP at that visit was 136/84. Management since that visit includes continue medication. She reports good compliance with treatment. She is not having side effects.  She is not exercising. She is not adherent to low salt diet.   Outside blood pressures are being checked occasionally. She is experiencing none.  Patient denies chest pain, chest pressure/discomfort, irregular heart beat and palpitations.   Cardiovascular risk factors include advanced age (older than 40 for men, 26 for women), diabetes mellitus, dyslipidemia, hypertension and obesity (BMI >= 30 kg/m2).  Use of agents associated with hypertension: none.    ------------------------------------------------------------------------    Lipid/Cholesterol, Follow-up:   Last seen for this 4 months ago.  Management since that visit includes continue medication.  Last Lipid Panel: Labs(Brief)   Lab Results  Component Value Date   CHOL 140 12/02/2016   HDL 47 12/02/2016   LDLCALC 67 12/02/2016   TRIG 132 12/02/2016   CHOLHDL 3.0 12/02/2016    She reports good compliance with treatment. She is not having side effects.      Wt Readings from Last 3 Encounters:  12/01/16 184 lb 3.2 oz (83.6 kg)  09/17/16 186 lb 4 oz (84.5 kg)  08/28/16 185 lb 9.6 oz (84.2 kg)    Past Medical History:  Diagnosis Date  . Diabetes mellitus without complication ([place])   . Diverticulitis   . History of echocardiogram    a. 11/2015: echo showing EF of 60-65% with no WMA. Grade 1 DD and mild MR noted.   . Hypertension   . PAF (paroxysmal atrial fibrillation) ([place])    a. initially diagnosed in 08/2016 --> started on Eliquis  . Syncope    a. initially occurring in Fall 2016 b. Hospitalized in 10/2015 and 11/2015 for recurrent episodes.    Past Surgical History:  Procedure Laterality Date  . ABDOMINAL HYSTERECTOMY    . BACK SURGERY  08/08/2008   Lumbar discectomy  . NECK SURGERY  1980   Family History  Problem Relation Age of Onset  . Hypertension Mother   . Diabetes Mother   . Lung cancer Father  Allergies  Allergen Reactions  . Morphine Sulfate Other (See Comments)    BP bottoms out  . Penicillins Diarrhea    Has patient had a PCN reaction causing immediate rash, facial/tongue/throat swelling, SOB or lightheadedness with hypotension: Yes Has patient had a PCN reaction causing severe rash involving mucus membranes or skin necrosis: Yes Has patient had a PCN reaction that required hospitalization No Has patient had a PCN reaction occurring within the last 10 years: No If all of the above answers are "NO", then may proceed with  Cephalosporin use.    Current Outpatient Medications:  .  apixaban (ELIQUIS) 5 MG TABS tablet, Take 1 tablet (5 mg total) by mouth 2 (two) times daily., Disp: 180 tablet, Rfl: 3 .  budesonide (ENTOCORT EC) 3 MG 24 hr capsule, TAKE THREE CAPSULES BY MOUTH DAILY, Disp: 90 capsule, Rfl: 1 .  Cholecalciferol (VITAMIN D) 2000 UNITS CAPS, Take 1 capsule by mouth daily. , Disp: , Rfl:  .  furosemide (LASIX) 20 MG tablet, TAKE ONE TABLET BY MOUTH DAILY AS NEEDED, Disp: 90 tablet, Rfl: 1 .  glipiZIDE (GLUCOTROL XL) 5 MG 24 hr tablet, Take 1 tablet (5 mg total) by mouth daily with breakfast., Disp: 90 tablet, Rfl: 3 .  lisinopril (PRINIVIL,ZESTRIL) 5 MG tablet, Take 1 tablet (5 mg total) by mouth daily., Disp: 90 tablet, Rfl: 3 .  metFORMIN (GLUCOPHAGE) 500 MG tablet, 1 tablet in the morning and 2 tablets in the evening (Patient taking differently: 1 tablet in the morning and 1 tablets in the evening), Disp: 90 tablet, Rfl: 12 .  metoprolol succinate (TOPROL XL) 25 MG 24 hr tablet, Take 1 tablet (25 mg total) by mouth daily., Disp: 90 tablet, Rfl: 3 .  Multiple Vitamin tablet, Take 1 tablet by mouth every evening. Reported on 03/13/2015, Disp: , Rfl:  .  simvastatin (ZOCOR) 20 MG tablet, Take 1 tablet (20 mg total) by mouth at bedtime., Disp: 90 tablet, Rfl: 3  Review of Systems  Constitutional: Negative.   Respiratory: Negative.   Cardiovascular: Negative.   Endocrine: Negative.   Musculoskeletal: Negative.    Social History   Tobacco Use  . Smoking status: Former Smoker    Last attempt to quit: 03/09/1978    Years since quitting: 39.1  . Smokeless tobacco: Never Used  Substance Use Topics  . Alcohol use: Yes    Alcohol/week: 0.6 oz    Types: 1 Glasses of wine per week    Comment: none since hospitalization   Objective:   BP 122/64 (BP Location: Right Arm, Patient Position: Sitting, Cuff Size: Normal)   Pulse 61   Temp 97.7 F (36.5 C) (Oral)   Wt 168 lb 12.8 oz (76.6 kg)   SpO2 98%    BMI 31.38 kg/m  Wt Readings from Last 3 Encounters:  04/14/17 168 lb 12.8 oz (76.6 kg)  12/24/16 181 lb 12 oz (82.4 kg)  12/01/16 184 lb 3.2 oz (83.6 kg)   Physical Exam  Constitutional: She is oriented to person, place, and time. She appears well-developed and well-nourished. No distress.  HENT:  Head: Normocephalic and atraumatic.  Right Ear: Hearing and external ear normal.  Left Ear: Hearing and external ear normal.  Nose: Nose normal.  Mouth/Throat: Oropharynx is clear and moist.  Eyes: Conjunctivae and lids are normal. Right eye exhibits no discharge. Left eye exhibits no discharge. No scleral icterus.  Neck: Neck supple.  Cardiovascular: Normal rate.  Pulmonary/Chest: Effort normal and breath sounds normal. No respiratory  distress.  Abdominal: Soft.  Musculoskeletal: Normal range of motion.  Neurological: She is alert and oriented to person, place, and time.  Skin: Skin is intact. No lesion and no rash noted.  Psychiatric: She has a normal mood and affect. Her speech is normal and behavior is normal. Thought content normal.       Assessment & Plan:     1. Type 2 diabetes mellitus with hyperosmolarity without coma, without long-term current use of insulin (HCC) Feeling well. Doesn't check FBS often (last reading she remembers was 120's). Still taking Metformin 500 mg BID and Glipizide 5 mg qd. No hypoglycemic episodes. Recommend she schedule ophthalmology screening annually. Recheck labs and follow up pending reports. - HgB A1c - Microalbumin, urine - CBC with Differential/Platelet - Comprehensive metabolic panel - Lipid panel  2. Essential hypertension Well controlled with Metoprolol and Lisinopril. Will recheck labs and follow up pending reports. - CBC with Differential/Platelet - Comprehensive metabolic panel  3. Hypercholesteremia Tolerating simvastatin 20 mg q hs and has lost 13 lbs since last office visit. Will continue present medications and recheck labs. -  Comprehensive metabolic panel - Lipid panel  4. Paroxysmal atrial fibrillation (HCC) Stable on the metoprolol succinate 25 mg qd, Eliquis 5 mg BID and regular follow up with cardiologist (Dr. Saunders Revel). Next appointment with him will be 04-29-17. No recent issues with chest pains or palpitations.       [name], PA  [facility]

## 2017-04-16 LAB — CBC WITH DIFFERENTIAL/PLATELET
BASOS: 0 %
Basophils Absolute: 0 10*3/uL (ref 0.0–0.2)
EOS (ABSOLUTE): 0.1 10*3/uL (ref 0.0–0.4)
EOS: 2 %
HEMATOCRIT: 37.8 % (ref 34.0–46.6)
HEMOGLOBIN: 12.2 g/dL (ref 11.1–15.9)
IMMATURE GRANS (ABS): 0 10*3/uL (ref 0.0–0.1)
IMMATURE GRANULOCYTES: 0 %
Lymphocytes Absolute: 2.5 10*3/uL (ref 0.7–3.1)
Lymphs: 39 %
MCH: 29.7 pg (ref 26.6–33.0)
MCHC: 32.3 g/dL (ref 31.5–35.7)
MCV: 92 fL (ref 79–97)
MONOCYTES: 13 %
MONOS ABS: 0.8 10*3/uL (ref 0.1–0.9)
Neutrophils Absolute: 2.9 10*3/uL (ref 1.4–7.0)
Neutrophils: 46 %
Platelets: 252 10*3/uL (ref 150–379)
RBC: 4.11 x10E6/uL (ref 3.77–5.28)
RDW: 13.6 % (ref 12.3–15.4)
WBC: 6.3 10*3/uL (ref 3.4–10.8)

## 2017-04-16 LAB — LIPID PANEL
CHOL/HDL RATIO: 2.9 ratio (ref 0.0–4.4)
Cholesterol, Total: 131 mg/dL (ref 100–199)
HDL: 45 mg/dL (ref >39)
LDL CALC: 61 mg/dL (ref 0–99)
Triglycerides: 127 mg/dL (ref 0–149)
VLDL Cholesterol Cal: 25 mg/dL (ref 5–40)

## 2017-04-16 LAB — COMPREHENSIVE METABOLIC PANEL
ALBUMIN: 4.1 g/dL (ref 3.5–4.7)
ALT: 20 IU/L (ref 0–32)
AST: 17 IU/L (ref 0–40)
Albumin/Globulin Ratio: 2 (ref 1.2–2.2)
Alkaline Phosphatase: 58 IU/L (ref 39–117)
BUN / CREAT RATIO: 22 (ref 12–28)
BUN: 19 mg/dL (ref 8–27)
Bilirubin Total: 0.5 mg/dL (ref 0.0–1.2)
CALCIUM: 9.6 mg/dL (ref 8.7–10.3)
CO2: 23 mmol/L (ref 20–29)
CREATININE: 0.85 mg/dL (ref 0.57–1.00)
Chloride: 106 mmol/L (ref 96–106)
GFR, EST AFRICAN AMERICAN: 73 mL/min/{1.73_m2} (ref >59)
GFR, EST NON AFRICAN AMERICAN: 64 mL/min/{1.73_m2} (ref >59)
GLOBULIN, TOTAL: 2.1 g/dL (ref 1.5–4.5)
Glucose: 132 mg/dL — ABNORMAL HIGH (ref 65–99)
Potassium: 4.4 mmol/L (ref 3.5–5.2)
SODIUM: 145 mmol/L — AB (ref 134–144)
TOTAL PROTEIN: 6.2 g/dL (ref 6.0–8.5)

## 2017-04-16 LAB — HEMOGLOBIN A1C
Est. average glucose Bld gHb Est-mCnc: 151 mg/dL
Hgb A1c MFr Bld: 6.9 % — ABNORMAL HIGH (ref 4.8–5.6)

## 2017-04-20 ENCOUNTER — Telehealth: Payer: Self-pay | Admitting: Family Medicine

## 2017-04-20 ENCOUNTER — Telehealth: Payer: Self-pay

## 2017-04-20 NOTE — Telephone Encounter (Signed)
Pt advised.   Thanks,   -Dylan Ruotolo  

## 2017-04-20 NOTE — Telephone Encounter (Signed)
Erroneous entry

## 2017-04-20 NOTE — Telephone Encounter (Signed)
-----   Message from Margo Common, [place] sent at 04/17/2017  6:07 PM EST ----- Cholesterol and triglycerides in good shape. Continue low fat diet with Simvastatin daily. Blood sugar slightly above fasting levels and Hgb A1C a little higher than last check but still at the goal of <7.0. Continue present medications and recheck in 3 months.

## 2017-04-29 ENCOUNTER — Ambulatory Visit: Payer: Medicare Other | Admitting: Internal Medicine

## 2017-04-29 ENCOUNTER — Ambulatory Visit
Admission: RE | Admit: 2017-04-29 | Discharge: 2017-04-29 | Disposition: A | Discharge status: Home / Self Care | Payer: Medicare Other | Source: Ambulatory Visit | Attending: Internal Medicine | Admitting: Internal Medicine

## 2017-04-29 ENCOUNTER — Encounter: Payer: Self-pay | Admitting: Internal Medicine

## 2017-04-29 VITALS — BP 120/58 | HR 80 | Ht 61.5 in | Wt 165.0 lb

## 2017-04-29 DIAGNOSIS — R0689 Other abnormalities of breathing: Secondary | ICD-10-CM

## 2017-04-29 DIAGNOSIS — R5383 Other fatigue: Secondary | ICD-10-CM

## 2017-04-29 DIAGNOSIS — J986 Disorders of diaphragm: Secondary | ICD-10-CM | POA: Insufficient documentation

## 2017-04-29 DIAGNOSIS — I480 Paroxysmal atrial fibrillation: Secondary | ICD-10-CM | POA: Diagnosis not present

## 2017-04-29 DIAGNOSIS — E782 Mixed hyperlipidemia: Secondary | ICD-10-CM | POA: Diagnosis not present

## 2017-04-29 DIAGNOSIS — I10 Essential (primary) hypertension: Secondary | ICD-10-CM

## 2017-04-29 NOTE — Patient Instructions (Signed)
Medication Instructions:  Your physician recommends that you continue on your current medications as directed. Please refer to the Current Medication list given to you today.   Labwork: none  Testing/Procedures: A chest x-ray takes a picture of the organs and structures inside the chest, including the heart, lungs, and blood vessels. This test can show several things, including, whether the heart is enlarges; whether fluid is building up in the lungs; and whether pacemaker / defibrillator leads are still in place. - Please go to the [facility]. You will check in at the front desk to the right as you walk into the atrium. Valet Parking is offered if needed.     Follow-Up: Your physician wants you to follow-up in: 6 MONTHS WITH DR END. You will receive a reminder letter in the mail two months in advance. If you don't receive a letter, please call our office to schedule the follow-up appointment.    If you need a refill on your cardiac medications before your next appointment, please call your pharmacy.    Chest X-Ray A chest X-ray is a painless test that uses radiation to create images of the structures inside of your chest. Chest X-rays are used to look for many health conditions, including heart failure, pneumonia, tuberculosis, rib fractures, breathing disorders, and cancer. They may be used to diagnose chest pain, constant coughing, or trouble breathing. Tell a health care provider about:  Any allergies you have.  All medicines you are taking, including vitamins, herbs, eye drops, creams, and over-the-counter medicines.  Any surgeries you have had.  Any medical conditions you have.  Whether you are pregnant or may be pregnant. What are the risks? Getting a chest X-ray is a safe procedure. However, you will be exposed to a small amount of radiation. Being exposed to too much radiation over a lifetime can increase the risk of cancer. This risk is small, but it may occur if  you have many X-rays throughout your life. What happens before the procedure?  You may be asked to remove glasses, jewelry, and any other metal objects.  You will be asked to undress from the waist up. You may be given a hospital gown to wear.  You may be asked to wear a protective lead apron to protect parts of your body from radiation. What happens during the procedure?  You will be asked to stand still as each picture is taken to get the best possible images.  You will be asked to take a deep breath and hold your breath for a few seconds.  The X-ray machine will create a picture of your chest using a tiny burst of radiation. This is painless.  More pictures may be taken from other angles. Typically, one picture will be taken while you face the X-ray camera, and another picture will be taken from the side while you stand. If you cannot stand, you may be asked to lie down. The procedure may vary among health care providers and hospitals. What happens after the procedure?  The X-ray(s) will be reviewed by your health care provider or an X-ray (radiology) specialist.  It is up to you to get your test results. Ask your health care provider, or the department that is doing the test, when your results will be ready.  Your health care provider will tell you if you need more tests or a follow-up exam. Keep all follow-up visits as told by your health care provider. This is important. Summary  A chest  X-ray is a safe, painless test that is used to examine the inside of the chest, heart, and lungs.  You will need to undress from the waist up and remove jewelry and metal objects before the procedure.  You will be exposed to a small amount of radiation during the procedure.  The X-ray machine will take one or more pictures of your chest while you remain as still as possible.  Later, a health care provider or specialist will review the test results with you. This information is not intended  to replace advice given to you by your health care provider. Make sure you discuss any questions you have with your health care provider. Document Released: 04/22/2016 Document Revised: 04/22/2016 Document Reviewed: 04/22/2016 Elsevier Interactive Patient Education  [employer].

## 2017-04-29 NOTE — Progress Notes (Signed)
Follow-up Outpatient Visit Date: 04/29/2017  Primary Care Provider: Margo Common, [address] [address]  Chief Complaint: Fatigue  HPI:  [name] is a 84 y.o. year-old female with history of  paroxysmal atrial fibrillation, hypertension, hyperlipidemia, diabetes mellitus, and recurrent syncope (thought to be due to vasovagal), who presents for follow-up of atrial fibrillation.  I last saw her in October, which time [name] was doing well with the exception of fatigue.  We did not make any medication changes at that time.  Today, [name] reports feeling about the same.  She continues to note fatigue on a frequent basis, maybe a little bit worse since Christmas.  She attributes this to stress at home, as her son-in-law was recently diagnosed with metastatic appendiceal cancer.  Otherwise, [name] has felt well.  She denies chest pain, shortness of breath, palpitations, lightheadedness, and syncope.  She notes occasional dependent edema that resolves with as needed furosemide.  She has noted decreased appetite over the last few months with an accompanying 16 pound weight loss.  She is beginning to eat more again at the urging of her family.  She typically sleeps well at night but often will doze off during the day when she is not busy.  She remains compliant with her medications, including apixaban.  She denies bleeding.  --------------------------------------------------------------------------------------------------  Cardiovascular History & Procedures: Cardiovascular Problems:  Paroxysmal atrial fibrillation  Syncope  Risk Factors:  Diabetes mellitus, hypertension, and age  Cath/PCI:  None.  CV Surgery:  None.  EP Procedures and Devices:  30-day event monitor (12/2015): Predominant rhythm was sinus with an average rate 60 bpm (range 45-134 bpm). Occasional PACs and PVCs were noted. There were 2 episodes of superventricular tachycardia  lasting up to 14 beats with a maximum rate of 136 bpm. No sustained arrhythmias were identified. Symptoms of feeling "tired and fatigued" we'll corresponded to sinus rhythm and sinus rhythm with PACs.  Non-Invasive Evaluation(s):  ABI's (09/15/16): Normal (1.1 bilaterally)  Pharmacologic myocardial perfusion stress test (01/08/16): Low risk study with small apical anterior and apical defect more pronounced on the rest images consistent with shifting breast attenuation. No ischemia identified. Normal LVEF (55-65%).  Transthoracic echocardiogram (11/21/15): Normal LV size and function (EF 60-65%) with grade 1 diastolic dysfunction. Mild MR. Normal RV size and function. Normal pulmonary artery pressure.  Carotid Doppler (11/22/15): No hemodynamically significant plaque or stenosis in either cervical carotid artery.  Recent CV Pertinent Labs: Lab Results  Component Value Date   CHOL 131 04/14/2017   HDL 45 04/14/2017   LDLCALC 61 04/14/2017   TRIG 127 04/14/2017   CHOLHDL 2.9 04/14/2017   K 4.4 04/14/2017   MG 1.8 08/13/2016   BUN 19 04/14/2017   CREATININE 0.85 04/14/2017    Past medical and surgical history were reviewed and updated in EPIC.  Current Meds  Medication Sig  . apixaban (ELIQUIS) 5 MG TABS tablet Take 1 tablet (5 mg total) by mouth 2 (two) times daily.  . Cholecalciferol (VITAMIN D) 2000 UNITS CAPS Take 1 capsule by mouth daily.   . furosemide (LASIX) 20 MG tablet TAKE ONE TABLET BY MOUTH DAILY AS NEEDED  . glipiZIDE (GLUCOTROL XL) 5 MG 24 hr tablet Take 1 tablet (5 mg total) by mouth daily with breakfast.  . lisinopril (PRINIVIL,ZESTRIL) 5 MG tablet Take 1 tablet (5 mg total) by mouth daily.  . metFORMIN (GLUCOPHAGE) 500 MG tablet Take 500 mg by mouth 2 (two) times daily with  a meal.  . metoprolol succinate (TOPROL XL) 25 MG 24 hr tablet Take 1 tablet (25 mg total) by mouth daily.  . Multiple Vitamin tablet Take 1 tablet by mouth every evening. Reported on 03/13/2015  .  simvastatin (ZOCOR) 20 MG tablet Take 1 tablet (20 mg total) by mouth at bedtime.    Allergies: Morphine sulfate and Penicillins  Social History   Socioeconomic History  . Marital status: Widowed    Spouse name: Not on file  . Number of children: Not on file  . Years of education: Not on file  . Highest education level: Not on file  Social Needs  . Financial resource strain: Not on file  . Food insecurity - worry: Not on file  . Food insecurity - inability: Not on file  . Transportation needs - medical: Not on file  . Transportation needs - non-medical: Not on file  Occupational History  . Not on file  Tobacco Use  . Smoking status: Former Smoker    Last attempt to quit: 03/09/1978    Years since quitting: 39.1  . Smokeless tobacco: Never Used  Substance and Sexual Activity  . Alcohol use: Yes    Alcohol/week: 0.6 oz    Types: 1 Glasses of wine per week    Comment: none since hospitalization  . Drug use: No  . Sexual activity: Not on file  Other Topics Concern  . Not on file  Social History Narrative  . Not on file    Family History  Problem Relation Age of Onset  . Hypertension Mother   . Diabetes Mother   . Lung cancer Father     Review of Systems: A 12-system review of systems was performed and was negative except as noted in the HPI.  --------------------------------------------------------------------------------------------------  Physical Exam: BP (!) 120/58 (BP Location: Left Arm, Patient Position: Sitting, Cuff Size: Normal)   Pulse 80   Ht 5' 1.5" (1.562 m)   Wt 165 lb (74.8 kg)   BMI 30.67 kg/m   General: Obese elderly woman, seated comfortably in the exam room. HEENT: No conjunctival pallor or scleral icterus. Moist mucous membranes.  OP clear. Neck: Supple without lymphadenopathy, thyromegaly, JVD, or HJR. Lungs: Normal work of breathing.  Diminished breath sounds at the left base.  Otherwise clear without wheezes or crackles. Heart: Regular  rate and rhythm without murmurs, rubs, or gallops. Non-displaced PMI. Abd: Bowel sounds present. Soft, NT/ND without hepatosplenomegaly Ext: No lower extremity edema. Radial, PT, and DP pulses are 2+ bilaterally. Skin: Warm and dry without rash.  EKG: Normal sinus rhythm with sinus arrhythmia.  Otherwise, no significant abnormalities.  Paroxysmal atrial fibrillation.  Lab Results  Component Value Date   WBC 6.3 04/14/2017   HGB 12.2 04/14/2017   HCT 37.8 04/14/2017   MCV 92 04/14/2017   PLT 252 04/14/2017    Lab Results  Component Value Date   NA 145 (H) 04/14/2017   K 4.4 04/14/2017   CL 106 04/14/2017   CO2 23 04/14/2017   BUN 19 04/14/2017   CREATININE 0.85 04/14/2017   GLUCOSE 132 (H) 04/14/2017   ALT 20 04/14/2017    Lab Results  Component Value Date   CHOL 131 04/14/2017   HDL 45 04/14/2017   LDLCALC 61 04/14/2017   TRIG 127 04/14/2017   CHOLHDL 2.9 04/14/2017    --------------------------------------------------------------------------------------------------  ASSESSMENT AND PLAN: Paroxysmal atrial fibrillation No symptoms reported by the patient.  She is tolerating low-dose metoprolol and apixaban well.  Given  fatigue and daytime somnolence, we discussed the risks and benefits of discontinuing metoprolol.  We have agreed to continue her current medications.  She will need to remain on indefinite anticoagulation, given her CHADSVASC score of at least 5.  Fatigue and diminished breath sounds Stable to slightly worse though complicated by increased stress from her son-in-law's recent cancer diagnosis.  I encouraged her to increase her activity, including regular exercise.  Given diminished breath sounds at the left lung base and weight loss, I will obtain a PA and lateral chest radiograph to exclude infiltrate or malignancy.  Hypertension Blood pressure well controlled today.  No medication changes.  Hyperlipidemia Recent lipid panel shows excellent LDL.   Continue simvastatin, per PCP.  Follow-up: Return to clinic in 6 months.  Nelva Bush, MD 04/29/2017 3:15 PM

## 2017-05-11 ENCOUNTER — Other Ambulatory Visit: Payer: Self-pay | Admitting: Family Medicine

## 2017-05-11 ENCOUNTER — Telehealth: Payer: Self-pay | Admitting: Family Medicine

## 2017-05-11 DIAGNOSIS — K529 Noninfective gastroenteritis and colitis, unspecified: Secondary | ICD-10-CM

## 2017-05-11 NOTE — Telephone Encounter (Signed)
Placed order in the chart to schedule recheck of collagenous colitis by Dr. Allen Norris. Last colonoscopy 07-15-13 (by Dr. Dorothey Baseman letter). Continues to have diarrhea chronically.

## 2017-05-11 NOTE — Telephone Encounter (Signed)
Pt requesting a referral to GI, pt is requesting Dr. Durwin Reges or another provider in the office.  She states she is having diarrhea still and it is getting worse.

## 2017-05-14 ENCOUNTER — Other Ambulatory Visit: Payer: Self-pay | Admitting: Family Medicine

## 2017-05-18 ENCOUNTER — Ambulatory Visit: Payer: Medicare Other | Admitting: Gastroenterology

## 2017-05-22 ENCOUNTER — Ambulatory Visit: Payer: Medicare Other | Admitting: Gastroenterology

## 2017-05-22 ENCOUNTER — Encounter: Payer: Self-pay | Admitting: Gastroenterology

## 2017-05-22 VITALS — BP 144/63 | HR 70 | Temp 97.9°F | Ht 61.5 in | Wt 160.4 lb

## 2017-05-22 DIAGNOSIS — K529 Noninfective gastroenteritis and colitis, unspecified: Secondary | ICD-10-CM | POA: Diagnosis not present

## 2017-05-22 DIAGNOSIS — R197 Diarrhea, unspecified: Secondary | ICD-10-CM

## 2017-05-22 DIAGNOSIS — K52831 Collagenous colitis: Secondary | ICD-10-CM | POA: Diagnosis not present

## 2017-05-22 MED ORDER — BUDESONIDE 3 MG PO CPEP: 9.0000 mg | ORAL_CAPSULE | ORAL | 2 refills | Status: DC

## 2017-05-22 NOTE — Progress Notes (Signed)
Cephas Darby, MD [address]  [place]  [place], [place] [address]  Main: [phone]  Fax: [phone]    Gastroenterology Consultation  Referring Provider:     Margo Common, PA Primary Care Physician:  Margo Common, PA Primary Gastroenterologist:  Dr. Cephas Darby Reason for Consultation:   Collagenous colitis        HPI:   [name] is a 84 y.o. female referred by Dr. Margo Common, PA  for consultation & management of chronic diarrhea. She has history of paroxysmal A. Fib, currently on apixaban and metoprolol. She is diagnosed with collagenous colitis by Dr. Allen Norris in 2015 when she had a colonoscopy for chronic diarrhea. She has been on Entocort 3 pills daily since then which brought her to remission. She was having 1 formed bowel movement daily. Patient self tapered the medication due to cost to 2 pills a day for 1 month then one pill a day for one month. She stopped taking Entocort about 3-4 months ago. She reports having recurrence of diarrhea, nonbloody, runny liquid since one & half months. She is taking Imodium as needed which has improved consistency but not back to normal. She also had episodes of incontinence during nighttime. She has been going through stress lately as her son-in-law passed away from metastatic appendiceal carcinoma. She lost weight intentionally and also secondary to stress. She reports that her weight has been stable now and coping up with stress well. Her daughter is with her now. She otherwise denies abdominal pain, bloating, nausea, vomiting, rectal bleeding or hematochezia or melena. Her most recent labs have been unremarkable. There is no evidence of anemia.  NSAIDs: none  Antiplts/Anticoagulants/Anti thrombotics: apixaban for paroxysmal A. fib  GI Procedures:  Colonoscopy in 2015 by Dr. Allen Norris Diverticulosis, internal hemorrhoids, random colon biopsies revealed collagenous colitis  Past Medical History:  Diagnosis  Date  . Diabetes mellitus without complication ([place])   . Diverticulitis   . History of echocardiogram    a. 11/2015: echo showing EF of 60-65% with no WMA. Grade 1 DD and mild MR noted.   . Hypertension   . PAF (paroxysmal atrial fibrillation) ([place])    a. initially diagnosed in 08/2016 --> started on Eliquis  . Syncope    a. initially occurring in Fall 2016 b. Hospitalized in 10/2015 and 11/2015 for recurrent episodes.     Past Surgical History:  Procedure Laterality Date  . ABDOMINAL HYSTERECTOMY    . BACK SURGERY  08/08/2008   Lumbar discectomy  . NECK SURGERY  1980     Current Outpatient Medications:  .  apixaban (ELIQUIS) 5 MG TABS tablet, Take 1 tablet (5 mg total) by mouth 2 (two) times daily., Disp: 180 tablet, Rfl: 3 .  Cholecalciferol (VITAMIN D) 2000 UNITS CAPS, Take 1 capsule by mouth daily. , Disp: , Rfl:  .  furosemide (LASIX) 20 MG tablet, TAKE ONE TABLET BY MOUTH DAILY AS NEEDED, Disp: 90 tablet, Rfl: 1 .  glipiZIDE (GLUCOTROL XL) 5 MG 24 hr tablet, Take 1 tablet (5 mg total) by mouth daily with breakfast., Disp: 90 tablet, Rfl: 3 .  lisinopril (PRINIVIL,ZESTRIL) 5 MG tablet, Take 1 tablet (5 mg total) by mouth daily., Disp: 90 tablet, Rfl: 3 .  metFORMIN (GLUCOPHAGE) 500 MG tablet, Take 500 mg by mouth 2 (two) times daily with a meal., Disp: , Rfl:  .  metoprolol succinate (TOPROL XL) 25 MG 24 hr tablet, Take 1 tablet (25 mg  total) by mouth daily., Disp: 90 tablet, Rfl: 3 .  Multiple Vitamin tablet, Take 1 tablet by mouth every evening. Reported on 03/13/2015, Disp: , Rfl:  .  simvastatin (ZOCOR) 20 MG tablet, TAKE 1 TABLET BY MOUTH AT BEDTIME, Disp: 30 tablet, Rfl: 12 .  budesonide (ENTOCORT EC) 3 MG 24 hr capsule, Take 3 capsules (9 mg total) by mouth every morning., Disp: 90 capsule, Rfl: 2   Family History  Problem Relation Age of Onset  . Hypertension Mother   . Diabetes Mother   . Lung cancer Father      Social History   Tobacco Use  . Smoking status:  Former Smoker    Last attempt to quit: 03/09/1978    Years since quitting: 39.2  . Smokeless tobacco: Never Used  Substance Use Topics  . Alcohol use: Yes    Alcohol/week: 0.6 oz    Types: 1 Glasses of wine per week    Comment: none since hospitalization  . Drug use: No    Allergies as of 05/22/2017 - Review Complete 05/22/2017  Allergen Reaction Noted  . Morphine sulfate Other (See Comments) 07/12/2014  . Penicillins Diarrhea 07/12/2014    Review of Systems:    All systems reviewed and negative except where noted in HPI.   Physical Exam:  BP (!) 144/63   Pulse 70   Temp 97.9 F (36.6 C) (Oral)   Ht 5' 1.5" (1.562 m)   Wt 160 lb 6.4 oz (72.8 kg)   BMI 29.82 kg/m  No LMP recorded. Patient has had a hysterectomy.  General:   Alert,  Well-developed, well-nourished, pleasant and cooperative in NAD Head:  Normocephalic and atraumatic. Eyes:  Sclera clear, no icterus.   Conjunctiva pink. Ears:  Normal auditory acuity. Nose:  No deformity, discharge, or lesions. Mouth:  No deformity or lesions,oropharynx pink & moist. Neck:  Supple; no masses or thyromegaly. Lungs:  Respirations even and unlabored.  Clear throughout to auscultation.   No wheezes, crackles, or rhonchi. No acute distress. Heart:  Regular rate and rhythm; no murmurs, clicks, rubs, or gallops. Abdomen:  Normal bowel sounds. Soft, non-tender and non-distended without masses, hepatosplenomegaly or hernias noted.  No guarding or rebound tenderness.   Rectal: Not performed Msk:  Symmetrical without gross deformities. Good, equal movement & strength bilaterally. Pulses:  Normal pulses noted. Extremities:  No clubbing or edema.  No cyanosis. Neurologic:  Alert and oriented x3;  grossly normal neurologically. Skin:  Intact without significant lesions or rashes. No jaundice. Psych:  Alert and cooperative. Normal mood and affect.  Imaging Studies: No recent abdominal imaging  Assessment and Plan:   NAVJOT PILGRIM  is a 84 y.o. female with collagenous colitis, in remission on Entocort, presents with recurrence in last 1-2 months after discontinuation of Entocort. She does not have alarm signs or symptoms otherwise.  - Recommend stool studies to evaluate for infections, check fecal calprotectin - Restart Entocort, 3mg  2-3 pills daily, taper to the lowest dose that keeps her diarrhea in remission   Follow up in 4 weeks   Cephas Darby, MD

## 2017-05-25 ENCOUNTER — Other Ambulatory Visit: Payer: Self-pay | Admitting: Family Medicine

## 2017-05-25 DIAGNOSIS — R609 Edema, unspecified: Secondary | ICD-10-CM

## 2017-05-25 DIAGNOSIS — E119 Type 2 diabetes mellitus without complications: Secondary | ICD-10-CM

## 2017-05-25 NOTE — Telephone Encounter (Signed)
Simona Huh, I think this is your patient

## 2017-05-26 ENCOUNTER — Other Ambulatory Visit
Admission: RE | Admit: 2017-05-26 | Discharge: 2017-05-26 | Disposition: A | Discharge status: Home / Self Care | Payer: Medicare Other | Source: Ambulatory Visit | Attending: Gastroenterology | Admitting: Gastroenterology

## 2017-05-26 DIAGNOSIS — R197 Diarrhea, unspecified: Secondary | ICD-10-CM | POA: Insufficient documentation

## 2017-05-27 LAB — GASTROINTESTINAL PANEL BY PCR, STOOL (REPLACES STOOL CULTURE)

## 2017-05-29 LAB — CALPROTECTIN, FECAL: CALPROTECTIN, FECAL: 68 ug/g (ref 0–120)

## 2017-06-24 ENCOUNTER — Ambulatory Visit: Payer: Medicare Other | Admitting: Gastroenterology

## 2017-06-24 ENCOUNTER — Other Ambulatory Visit: Payer: Self-pay

## 2017-06-24 ENCOUNTER — Encounter: Payer: Self-pay | Admitting: Gastroenterology

## 2017-06-24 VITALS — BP 128/66 | HR 73 | Temp 97.6°F | Ht 65.0 in | Wt 161.6 lb

## 2017-06-24 DIAGNOSIS — K52831 Collagenous colitis: Secondary | ICD-10-CM

## 2017-06-24 NOTE — Progress Notes (Signed)
Cephas Darby, MD [address]  [place]  [place], [place] [address]  Main: [phone]  Fax: [phone]    Gastroenterology Consultation  Referring Provider:     Margo Common, PA Primary Care Physician:  Margo Common, PA Primary Gastroenterologist:  Dr. Cephas Darby Reason for Consultation:   Collagenous colitis        HPI:   [name] is a 84 y.o. female referred by Dr. Margo Common, PA  for consultation & management of chronic diarrhea. She has history of paroxysmal A. Fib, currently on apixaban and metoprolol. She is diagnosed with collagenous colitis by Dr. Allen Norris in 2015 when she had a colonoscopy for chronic diarrhea. She has been on Entocort 3 pills daily since then which brought her to remission. She was having 1 formed bowel movement daily. Patient self tapered the medication due to cost to 2 pills a day for 1 month then one pill a day for one month. She stopped taking Entocort about 3-4 months ago. She reports having recurrence of diarrhea, nonbloody, runny liquid since one & half months. She is taking Imodium as needed which has improved consistency but not back to normal. She also had episodes of incontinence during nighttime. She has been going through stress lately as her son-in-law passed away from metastatic appendiceal carcinoma. She lost weight intentionally and also secondary to stress. She reports that her weight has been stable now and coping up with stress well. Her daughter is with her now. She otherwise denies abdominal pain, bloating, nausea, vomiting, rectal bleeding or hematochezia or melena. Her most recent labs have been unremarkable. There is no evidence of anemia.  Follow-up visit 06/24/2017: She started Entocort 3 mg 3 pills daily. Currently having one formed bowel movement daily. She wants to know if she can decrease the dose to 2 pills daily. She otherwise does not have any complaints today  NSAIDs:  none  Antiplts/Anticoagulants/Anti thrombotics: apixaban for paroxysmal A. fib  GI Procedures:  Colonoscopy in 2015 by Dr. Allen Norris Diverticulosis, internal hemorrhoids, random colon biopsies revealed collagenous colitis  Past Medical History:  Diagnosis Date  . Chronic diarrhea   . Collagenous colitis   . Diabetes mellitus without complication ([place])   . Diverticulitis   . History of echocardiogram    a. 11/2015: echo showing EF of 60-65% with no WMA. Grade 1 DD and mild MR noted.   . Hypertension   . PAF (paroxysmal atrial fibrillation) ([place])    a. initially diagnosed in 08/2016 --> started on Eliquis  . Syncope    a. initially occurring in Fall 2016 b. Hospitalized in 10/2015 and 11/2015 for recurrent episodes.     Past Surgical History:  Procedure Laterality Date  . ABDOMINAL HYSTERECTOMY    . BACK SURGERY  08/08/2008   Lumbar discectomy  . NECK SURGERY  1980     Current Outpatient Medications:  .  apixaban (ELIQUIS) 5 MG TABS tablet, Take 1 tablet (5 mg total) by mouth 2 (two) times daily., Disp: 180 tablet, Rfl: 3 .  budesonide (ENTOCORT EC) 3 MG 24 hr capsule, Take 3 capsules (9 mg total) by mouth every morning., Disp: 90 capsule, Rfl: 2 .  Cholecalciferol (VITAMIN D) 2000 UNITS CAPS, Take 1 capsule by mouth daily. , Disp: , Rfl:  .  furosemide (LASIX) 20 MG tablet, TAKE ONE TABLET BY MOUTH DAILY AS NEEDED, Disp: 90 tablet, Rfl: 0 .  glipiZIDE (GLUCOTROL XL) 5 MG 24 hr tablet,  Take 1 tablet (5 mg total) by mouth daily with breakfast., Disp: 90 tablet, Rfl: 3 .  lisinopril (PRINIVIL,ZESTRIL) 5 MG tablet, Take 1 tablet (5 mg total) by mouth daily., Disp: 90 tablet, Rfl: 3 .  metFORMIN (GLUCOPHAGE) 500 MG tablet, Take 1 tablet (500 mg total) by mouth 2 (two) times daily with a meal. TAKE ONE TABLET IN THE MORNING AND TWO TABLETS IN THE EVENING, Disp: 180 tablet, Rfl: 3 .  metoprolol succinate (TOPROL XL) 25 MG 24 hr tablet, Take 1 tablet (25 mg total) by mouth daily., Disp: 90  tablet, Rfl: 3 .  Multiple Vitamin tablet, Take 1 tablet by mouth every evening. Reported on 03/13/2015, Disp: , Rfl:  .  simvastatin (ZOCOR) 20 MG tablet, TAKE 1 TABLET BY MOUTH AT BEDTIME, Disp: 30 tablet, Rfl: 12   Family History  Problem Relation Age of Onset  . Hypertension Mother   . Diabetes Mother   . Lung cancer Father      Social History   Tobacco Use  . Smoking status: Former Smoker    Last attempt to quit: 03/09/1978    Years since quitting: 39.3  . Smokeless tobacco: Never Used  Substance Use Topics  . Alcohol use: Yes    Alcohol/week: 0.6 oz    Types: 1 Glasses of wine per week    Comment: none since hospitalization  . Drug use: No    Allergies as of 06/24/2017 - Review Complete 06/24/2017  Allergen Reaction Noted  . Morphine sulfate Other (See Comments) 07/12/2014  . Penicillins Diarrhea 07/12/2014    Review of Systems:    All systems reviewed and negative except where noted in HPI.   Physical Exam:  BP 128/66   Pulse 73   Temp 97.6 F (36.4 C) (Oral)   Ht 5\' 5"  (1.651 m)   Wt 161 lb 9.6 oz (73.3 kg)   BMI 26.89 kg/m  No LMP recorded. Patient has had a hysterectomy.  General:   Alert,  Well-developed, well-nourished, pleasant and cooperative in NAD Head:  Normocephalic and atraumatic. Eyes:  Sclera clear, no icterus.   Conjunctiva pink. Ears:  Normal auditory acuity. Nose:  No deformity, discharge, or lesions. Mouth:  No deformity or lesions,oropharynx pink & moist. Neck:  Supple; no masses or thyromegaly. Lungs:  Respirations even and unlabored.  Clear throughout to auscultation.   No wheezes, crackles, or rhonchi. No acute distress. Heart:  Regular rate and rhythm; no murmurs, clicks, rubs, or gallops. Abdomen:  Normal bowel sounds. Soft, non-tender and non-distended without masses, hepatosplenomegaly or hernias noted.  No guarding or rebound tenderness.   Rectal: Not performed Msk:  Symmetrical without gross deformities. Good, equal movement &  strength bilaterally. Pulses:  Normal pulses noted. Extremities:  No clubbing or edema.  No cyanosis. Neurologic:  Alert and oriented x3;  grossly normal neurologically. Skin:  Intact without significant lesions or rashes. No jaundice. Psych:  Alert and cooperative. Normal mood and affect.  Imaging Studies: No recent abdominal imaging  Assessment and Plan:   [name] is a 84 y.o. female with collagenous colitis, in remission on Entocort, presents with recurrence in last 1-2 months after discontinuation of Entocort. She does not have alarm signs or symptoms otherwise. Her diarrhea resolved on Entocort 3 pills daily. Her infectious workup came back negative. Normal fecal calprotectin  - decrease Entocort, 3mg  to 2pills daily for 3 weeks, then taper down to 1 pill daily if she no longer experiences diarrhea on 2 pills daily -  Told her that our goal to taper to the lowest dose that keeps her diarrhea in remission   Follow up in 2-3 months   Cephas Darby, MD

## 2017-07-03 ENCOUNTER — Other Ambulatory Visit: Payer: Self-pay | Admitting: Family Medicine

## 2017-07-03 DIAGNOSIS — E119 Type 2 diabetes mellitus without complications: Secondary | ICD-10-CM

## 2017-07-18 ENCOUNTER — Other Ambulatory Visit: Payer: Self-pay | Admitting: Internal Medicine

## 2017-07-20 NOTE — Telephone Encounter (Signed)
Please review for refill, Thanks !  

## 2017-08-17 ENCOUNTER — Other Ambulatory Visit: Payer: Self-pay | Admitting: Student

## 2017-08-17 ENCOUNTER — Other Ambulatory Visit: Payer: Self-pay | Admitting: Gastroenterology

## 2017-08-28 ENCOUNTER — Telehealth: Payer: Self-pay | Admitting: Gastroenterology

## 2017-08-28 NOTE — Telephone Encounter (Signed)
Pt left vm she states Dr. Marius Ditch told her she could work her rx endocurt ec down from  3 pills to 1 pill she would like to have her scripted rewritten and send to [facility] it would safe her money

## 2017-08-31 ENCOUNTER — Other Ambulatory Visit: Payer: Self-pay

## 2017-08-31 MED ORDER — BUDESONIDE 3 MG PO CPEP: 3.0000 mg | ORAL_CAPSULE | Freq: Every day | ORAL | 1 refills | Status: DC

## 2017-08-31 NOTE — Telephone Encounter (Signed)
Prescription has been changed to once daily per Dr. Marius Ditch and has been sent to Pharmacy, pt has been notified and verbalized understanding

## 2017-09-08 ENCOUNTER — Ambulatory Visit: Payer: Medicare Other | Admitting: Family Medicine

## 2017-09-08 ENCOUNTER — Encounter: Payer: Self-pay | Admitting: Family Medicine

## 2017-09-08 VITALS — BP 102/58 | HR 73 | Temp 97.9°F | Wt 168.6 lb

## 2017-09-08 DIAGNOSIS — E119 Type 2 diabetes mellitus without complications: Secondary | ICD-10-CM | POA: Diagnosis not present

## 2017-09-08 DIAGNOSIS — S81812A Laceration without foreign body, left lower leg, initial encounter: Secondary | ICD-10-CM | POA: Diagnosis not present

## 2017-09-08 MED ORDER — DOXYCYCLINE HYCLATE 100 MG PO TABS
100.0000 mg | ORAL_TABLET | Freq: Two times a day (BID) | ORAL | 0 refills | Status: DC
Start: 2017-09-08 — End: 2017-09-18

## 2017-09-08 MED ORDER — METFORMIN HCL 500 MG PO TABS: 500.0000 mg | ORAL_TABLET | Freq: Two times a day (BID) | ORAL | 3 refills | Status: DC

## 2017-09-08 NOTE — Progress Notes (Signed)
[name]    DOB: 1931-09-04   84 y.o.   MRN: [id number] Visit Date: 09/08/2017  Today's Provider: [name], PA   Chief Complaint  Patient presents with  . Leg Pain   Subjective:    Leg Pain   Incident onset: last week. Incident location: at nail salon. Injury mechanism: laceration from a metal object on the side of pedicure chair. The pain is present in the left leg. The quality of the pain is described as aching. The pain has been constant since onset. Associated symptoms comments: Redness, heat, and swelling . Treatments tried: Saline solution, Peroxide and bandage. The treatment provided mild relief.   Past Medical History:  Diagnosis Date  . Chronic diarrhea   . Collagenous colitis   . Diabetes mellitus without complication ([place])   . Diverticulitis   . History of echocardiogram    a. 11/2015: echo showing EF of 60-65% with no WMA. Grade 1 DD and mild MR noted.   . Hypertension   . PAF (paroxysmal atrial fibrillation) ([place])    a. initially diagnosed in 08/2016 --> started on Eliquis  . Syncope    a. initially occurring in Fall 2016 b. Hospitalized in 10/2015 and 11/2015 for recurrent episodes.    Past Surgical History:  Procedure Laterality Date  . ABDOMINAL HYSTERECTOMY    . BACK SURGERY  08/08/2008   Lumbar discectomy  . NECK SURGERY  1980   Family History  Problem Relation Age of Onset  . Hypertension Mother   . Diabetes Mother   . Lung cancer Father    Allergies  Allergen Reactions  . Morphine Sulfate Other (See Comments)    BP bottoms out  . Penicillins Diarrhea    Has patient had a PCN reaction causing immediate rash, facial/tongue/throat swelling, SOB or lightheadedness with hypotension: Yes Has patient had a PCN reaction causing severe rash involving mucus membranes or skin necrosis: Yes Has patient had a PCN reaction that required hospitalization No Has patient had a PCN reaction occurring within the last 10 years:  No If all of the above answers are "NO", then may proceed with Cephalosporin use.   Current Outpatient Medications on File Prior to Visit  Medication Sig Dispense Refill  . budesonide (ENTOCORT EC) 3 MG 24 hr capsule Take 1 capsule (3 mg total) by mouth daily. 90 capsule 1  . Cholecalciferol (VITAMIN D) 2000 UNITS CAPS Take 1 capsule by mouth daily.     Marland Kitchen ELIQUIS 5 MG TABS tablet TAKE ONE TABLET BY MOUTH TWICE A DAY 120 tablet 2  . furosemide (LASIX) 20 MG tablet TAKE ONE TABLET BY MOUTH DAILY AS NEEDED 90 tablet 0  . glipiZIDE (GLUCOTROL XL) 5 MG 24 hr tablet TAKE ONE TABLET BY MOUTH DAILY WITH BREAKFAST.  90 tablet 2  . lisinopril (PRINIVIL,ZESTRIL) 5 MG tablet Take 1 tablet (5 mg total) by mouth daily. 90 tablet 3  . metoprolol succinate (TOPROL-XL) 25 MG 24 hr tablet TAKE ONE TABLET BY MOUTH DAILY 90 tablet 3  . Multiple Vitamin tablet Take 1 tablet by mouth every evening. Reported on 03/13/2015    . simvastatin (ZOCOR) 20 MG tablet TAKE 1 TABLET BY MOUTH AT BEDTIME 30 tablet 12   No current facility-administered medications on file prior to visit.      Review of Systems  Constitutional: Negative.   Respiratory: Negative.   Cardiovascular: Negative.   Skin:       Left lower  leg wound    Social History   Tobacco Use  . Smoking status: Former Smoker    Last attempt to quit: 03/09/1978    Years since quitting: 39.5  . Smokeless tobacco: Never Used  Substance Use Topics  . Alcohol use: Yes    Alcohol/week: 0.6 oz    Types: 1 Glasses of wine per week    Comment: none since hospitalization   Objective:   BP (!) 102/58 (BP Location: Right Arm, Patient Position: Sitting, Cuff Size: Normal)   Pulse 73   Temp 97.9 F (36.6 C) (Oral)   Wt 168 lb 9.6 oz (76.5 kg)   SpO2 97%   BMI 28.06 kg/m   Physical Exam  Constitutional: She is oriented to person, place, and time. She appears well-developed and well-nourished. No distress.  HENT:  Head: Normocephalic and atraumatic.   Right Ear: Hearing normal.  Left Ear: Hearing normal.  Nose: Nose normal.  Eyes: Conjunctivae and lids are normal. Right eye exhibits no discharge. Left eye exhibits no discharge. No scleral icterus.  Pulmonary/Chest: Effort normal. No respiratory distress.  Musculoskeletal: Normal range of motion.  Neurological: She is alert and oriented to person, place, and time.  Skin: Skin is intact. No lesion and no rash noted. There is erythema.  1.5 cm flap-type laceration with large erythematous and painful area around and below the laceration on the left lower leg. No lymphangitis or local lymphadenopathy.  Psychiatric: She has a normal mood and affect. Her speech is normal and behavior is normal. Thought content normal.    Media Information         Document Information   Photos    09/08/2017 11:16  Attached To:  Office Visit on 09/08/17 with Iwalani Templeton, Vickki Muff, PA   Source Information   Jaidin Richison, Vickki Muff, [place]  [facility]      Assessment & Plan:     1. Lower leg laceration with complication, left, initial encounter Onset last week as she was getting a pedicure. Scraped the left lower leg on a sharp piece of metal causing a triangular flap-type laceration. Tried to clean it with H2O2 and saline irrigation. Having large area of redness and tenderness around and below the laceration - cellulitis. Will treat with antibiotic and recheck in 3 days. Last Tdap was given on 02-28-15. Denies fever or local lymphadenitis. - doxycycline (VIBRA-TABS) 100 MG tablet; Take 1 tablet (100 mg total) by mouth 2 (two) times daily.  Dispense: 20 tablet; Refill: [place], [place]  [facility]

## 2017-09-11 ENCOUNTER — Encounter: Payer: Self-pay | Admitting: Family Medicine

## 2017-09-11 ENCOUNTER — Ambulatory Visit: Payer: Medicare Other | Admitting: Family Medicine

## 2017-09-11 VITALS — BP 122/58 | HR 67 | Temp 97.5°F | Wt 166.8 lb

## 2017-09-11 DIAGNOSIS — E1100 Type 2 diabetes mellitus with hyperosmolarity without nonketotic hyperglycemic-hyperosmolar coma (NKHHC): Secondary | ICD-10-CM | POA: Diagnosis not present

## 2017-09-11 DIAGNOSIS — E7800 Pure hypercholesterolemia, unspecified: Secondary | ICD-10-CM

## 2017-09-11 DIAGNOSIS — S81812D Laceration without foreign body, left lower leg, subsequent encounter: Secondary | ICD-10-CM | POA: Diagnosis not present

## 2017-09-11 DIAGNOSIS — I10 Essential (primary) hypertension: Secondary | ICD-10-CM

## 2017-09-11 LAB — POCT UA - MICROALBUMIN: Microalbumin Ur, POC: 20 mg/L

## 2017-09-11 NOTE — Progress Notes (Signed)
[name]    DOB: January 10, 1932   84 y.o.   MRN: [id number] Visit Date: 09/11/2017  Today's Provider: [name], PA   Chief Complaint  Patient presents with  . Lower Leg Laceration    follow up    Subjective:    HPI Lower Leg Laceration: Patient presents for a 3 day follow up. Last OV was on 09/08/17. Patient started Doxycycline 100 mg BID. She reports good compliance with treatment plan. Symptoms are unchanged. She states she noticed some swelling and localized heat.     Patient Active Problem List   Diagnosis Date Noted  . Mixed hyperlipidemia 04/29/2017  . Lower extremity edema 12/24/2016  . Fatigue 12/24/2016  . Current use of long term anticoagulation 09/17/2016  . Atrial fibrillation ([place]) 08/13/2016  . Seizure ([place])   . Faintness   . Syncope 11/21/2015  . ARF (acute renal failure) ([place]) 10/26/2015  . Laceration of right lower leg 09/28/2015  . Bursitis of hip 08/27/2015  . Senile purpura ([place]) 01/01/2015  . Edema 08/15/2014  . Arthritis 07/12/2014  . Basal cell carcinoma 07/12/2014  . CC (collagenous colitis) 07/12/2014  . Essential hypertension 07/12/2014  . Hypercholesteremia 07/12/2014  . Adiposity 07/12/2014  . Acne erythematosa 07/12/2014  . Diabetes mellitus, type 2 ([place]) 07/12/2014  . 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 Avitaminosis D 07/12/2014  . H/O malignant neoplasm of skin 10/10/2011   Past Surgical History:  Procedure Laterality Date  . ABDOMINAL HYSTERECTOMY    . BACK SURGERY  08/08/2008   Lumbar discectomy  . NECK SURGERY  1980   Family History  Problem Relation Age of Onset  . Hypertension Mother   . Diabetes Mother   . Lung cancer Father    Allergies  Allergen Reactions  . Morphine Sulfate Other (See Comments)    BP bottoms out  . Penicillins Diarrhea    Has patient had a PCN reaction causing immediate rash, facial/tongue/throat swelling, SOB or lightheadedness with hypotension: Yes Has patient had a PCN reaction  causing severe rash involving mucus membranes or skin necrosis: Yes Has patient had a PCN reaction that required hospitalization No Has patient had a PCN reaction occurring within the last 10 years: No If all of the above answers are "NO", then may proceed with Cephalosporin use.    Current Outpatient Medications:  .  budesonide (ENTOCORT EC) 3 MG 24 hr capsule, Take 1 capsule (3 mg total) by mouth daily., Disp: 90 capsule, Rfl: 1 .  Cholecalciferol (VITAMIN D) 2000 UNITS CAPS, Take 1 capsule by mouth daily. , Disp: , Rfl:  .  doxycycline (VIBRA-TABS) 100 MG tablet, Take 1 tablet (100 mg total) by mouth 2 (two) times daily., Disp: 20 tablet, Rfl: 0 .  ELIQUIS 5 MG TABS tablet, TAKE ONE TABLET BY MOUTH TWICE A DAY, Disp: 120 tablet, Rfl: 2 .  furosemide (LASIX) 20 MG tablet, TAKE ONE TABLET BY MOUTH DAILY AS NEEDED, Disp: 90 tablet, Rfl: 0 .  glipiZIDE (GLUCOTROL XL) 5 MG 24 hr tablet, TAKE ONE TABLET BY MOUTH DAILY WITH BREAKFAST, Disp: 90 tablet, Rfl: 2 .  lisinopril (PRINIVIL,ZESTRIL) 5 MG tablet, Take 1 tablet (5 mg total) by mouth daily., Disp: 90 tablet, Rfl: 3 .  metFORMIN (GLUCOPHAGE) 500 MG tablet, Take 1 tablet (500 mg total) by mouth 2 (two) times daily with a meal. TAKE ONE TABLET IN THE MORNING AND TWO TABLETS IN THE EVENING, Disp: 180 tablet, Rfl: 3 .  metoprolol succinate (TOPROL-XL) 25  MG 24 hr tablet, TAKE ONE TABLET BY MOUTH DAILY, Disp: 90 tablet, Rfl: 3 .  Multiple Vitamin tablet, Take 1 tablet by mouth every evening. Reported on 03/13/2015, Disp: , Rfl:  .  simvastatin (ZOCOR) 20 MG tablet, TAKE 1 TABLET BY MOUTH AT BEDTIME, Disp: 30 tablet, Rfl: 12  Review of Systems  Constitutional: Negative.   Respiratory: Negative.   Cardiovascular: Negative.   Skin:       Lower leg laceration   Social History   Tobacco Use  . Smoking status: Former Smoker    Last attempt to quit: 03/09/1978    Years since quitting: 39.5  . Smokeless tobacco: Never Used  Substance Use Topics  .  Alcohol use: Yes    Alcohol/week: 0.6 oz    Types: 1 Glasses of wine per week    Comment: none since hospitalization   Objective:   BP (!) 122/58 (BP Location: Right Arm, Patient Position: Sitting, Cuff Size: Normal)   Pulse 67   Temp (!) 97.5 F (36.4 C) (Oral)   Wt 166 lb 12.8 oz (75.7 kg)   SpO2 97%   BMI 27.76 kg/m   Physical Exam  Constitutional: She is oriented to person, place, and time. She appears well-developed and well-nourished. No distress.  HENT:  Head: Normocephalic and atraumatic.  Right Ear: Hearing normal.  Left Ear: Hearing normal.  Nose: Nose normal.  Eyes: Conjunctivae and lids are normal. Right eye exhibits no discharge. Left eye exhibits no discharge. No scleral icterus.  Cardiovascular: Normal rate.  Pulmonary/Chest: Effort normal. No respiratory distress.  Musculoskeletal: Normal range of motion.  Neurological: She is alert and oriented to person, place, and time.  Skin: Skin is intact. No lesion and no rash noted.  Less redness and not hot today. Still tender but in a more localized spot around the scab. No drainage or lymphangitis.  Psychiatric: She has a normal mood and affect. Her speech is normal and behavior is normal. Thought content normal.    Media Information         Document Information   Photos    09/11/2017 08:53  Attached To:  Office Visit on 09/11/17 with Lido Maske, Vickki Muff, PA   Source Information   Yousof Alderman, Vickki Muff, [place]  [facility]      Assessment & Plan:     1. Laceration of left lower leg with complication, subsequent encounter No fever and less redness of infected laceration on the left lateral lower leg. No drainage, fever or lymphangitis. Still tender but more localized to the triangular laceration scab. Will check CBC to assess infection and continue Doxycycline. Recheck in 1 week. - CBC with Differential/Platelet  2. Essential hypertension BP well controlled with Metoprolol and Lisinopril. Recheck  routine follow up labs. - CBC with Differential/Platelet - Comprehensive metabolic panel - Lipid panel - TSH  3. Type 2 diabetes mellitus with hyperosmolarity without coma, without long-term current use of insulin (HCC) Hgb A1C was 6.9 on 04-14-17. No polyuria or polydipsia. Encouraged to get ophthalmology recheck and continue Glipizide 5 mg qd, Metformin 500 mg BID with diabetic diet. Also, taking Lisinopril and Simvastatin. Recheck urine microalbumen was 20 mg/L today (same as on 08-15-14) and follow up labs drawn. - CBC with Differential/Platelet - Comprehensive metabolic panel - Lipid panel - Hemoglobin A1c  4. Hypercholesteremia Tolerating Simvastatin 20 mg qd without side effects. Will recheck labs. - Comprehensive metabolic panel - Lipid panel - TSH       [name],  [facility]

## 2017-09-12 LAB — LIPID PANEL
Chol/HDL Ratio: 3 ratio (ref 0.0–4.4)
Cholesterol, Total: 164 mg/dL (ref 100–199)
HDL: 54 mg/dL (ref >39)
LDL Calculated: 83 mg/dL (ref 0–99)
TRIGLYCERIDES: 136 mg/dL (ref 0–149)
VLDL Cholesterol Cal: 27 mg/dL (ref 5–40)

## 2017-09-12 LAB — COMPREHENSIVE METABOLIC PANEL
A/G RATIO: 1.7 (ref 1.2–2.2)
ALT: 20 IU/L (ref 0–32)
AST: 16 IU/L (ref 0–40)
Albumin: 4 g/dL (ref 3.5–4.7)
Alkaline Phosphatase: 60 IU/L (ref 39–117)
BILIRUBIN TOTAL: 0.5 mg/dL (ref 0.0–1.2)
BUN/Creatinine Ratio: 26 (ref 12–28)
BUN: 21 mg/dL (ref 8–27)
CHLORIDE: 102 mmol/L (ref 96–106)
CO2: 25 mmol/L (ref 20–29)
Calcium: 9.7 mg/dL (ref 8.7–10.3)
Creatinine, Ser: 0.8 mg/dL (ref 0.57–1.00)
GFR calc Af Amer: 78 mL/min/{1.73_m2} (ref >59)
GFR calc non Af Amer: 68 mL/min/{1.73_m2} (ref >59)
GLUCOSE: 150 mg/dL — AB (ref 65–99)
Globulin, Total: 2.4 g/dL (ref 1.5–4.5)
POTASSIUM: 4.3 mmol/L (ref 3.5–5.2)
Sodium: 141 mmol/L (ref 134–144)
Total Protein: 6.4 g/dL (ref 6.0–8.5)

## 2017-09-12 LAB — CBC WITH DIFFERENTIAL/PLATELET
BASOS ABS: 0 10*3/uL (ref 0.0–0.2)
BASOS: 0 %
EOS (ABSOLUTE): 0.1 10*3/uL (ref 0.0–0.4)
Eos: 2 %
Hematocrit: 40.1 % (ref 34.0–46.6)
Hemoglobin: 13 g/dL (ref 11.1–15.9)
IMMATURE GRANULOCYTES: 1 %
Immature Grans (Abs): 0 10*3/uL (ref 0.0–0.1)
Lymphocytes Absolute: 3.3 10*3/uL — ABNORMAL HIGH (ref 0.7–3.1)
Lymphs: 51 %
MCH: 30.2 pg (ref 26.6–33.0)
MCHC: 32.4 g/dL (ref 31.5–35.7)
MCV: 93 fL (ref 79–97)
MONOS ABS: 0.7 10*3/uL (ref 0.1–0.9)
Monocytes: 11 %
NEUTROS ABS: 2.3 10*3/uL (ref 1.4–7.0)
NEUTROS PCT: 35 %
PLATELETS: 282 10*3/uL (ref 150–450)
RBC: 4.31 x10E6/uL (ref 3.77–5.28)
RDW: 13.6 % (ref 12.3–15.4)
WBC: 6.5 10*3/uL (ref 3.4–10.8)

## 2017-09-12 LAB — HEMOGLOBIN A1C
Est. average glucose Bld gHb Est-mCnc: 151 mg/dL
HEMOGLOBIN A1C: 6.9 % — AB (ref 4.8–5.6)

## 2017-09-12 LAB — TSH: TSH: 3.2 u[IU]/mL (ref 0.450–4.500)

## 2017-09-18 ENCOUNTER — Encounter: Payer: Self-pay | Admitting: Family Medicine

## 2017-09-18 ENCOUNTER — Ambulatory Visit: Payer: Medicare Other | Admitting: Family Medicine

## 2017-09-18 VITALS — BP 126/66 | HR 80 | Temp 97.6°F | Resp 16 | Wt 166.8 lb

## 2017-09-18 DIAGNOSIS — S81812D Laceration without foreign body, left lower leg, subsequent encounter: Secondary | ICD-10-CM | POA: Diagnosis not present

## 2017-09-18 NOTE — Progress Notes (Signed)
[name]    DOB: 1931-08-17   84 y.o.   MRN: [id number] Visit Date: 09/18/2017  Today's Provider: [name], PA   Chief Complaint  Patient presents with  . Follow-up   Subjective:    HPI Patient here to follow up on left lower leg laceration. Patient was started on doxy and had labs done. Patient reports good compliance and tolerance with medications. Patient reports laceration is doing better. Patient reports she is done with doxy.     Patient Active Problem List   Diagnosis Date Noted  . Mixed hyperlipidemia 04/29/2017  . Lower extremity edema 12/24/2016  . Fatigue 12/24/2016  . Current use of long term anticoagulation 09/17/2016  . Atrial fibrillation ([place]) 08/13/2016  . Seizure ([place])   . Faintness   . Syncope 11/21/2015  . ARF (acute renal failure) ([place]) 10/26/2015  . Laceration of right lower leg 09/28/2015  . Bursitis of hip 08/27/2015  . Senile purpura ([place]) 01/01/2015  . Edema 08/15/2014  . Arthritis 07/12/2014  . Basal cell carcinoma 07/12/2014  . CC (collagenous colitis) 07/12/2014  . Essential hypertension 07/12/2014  . Hypercholesteremia 07/12/2014  . Adiposity 07/12/2014  . Acne erythematosa 07/12/2014  . Diabetes mellitus, type 2 ([place]) 07/12/2014  . 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 Avitaminosis D 07/12/2014  . H/O malignant neoplasm of skin 10/10/2011   Past Surgical History:  Procedure Laterality Date  . ABDOMINAL HYSTERECTOMY    . BACK SURGERY  08/08/2008   Lumbar discectomy  . NECK SURGERY  1980   Family History  Problem Relation Age of Onset  . Hypertension Mother   . Diabetes Mother   . Lung cancer Father    Allergies  Allergen Reactions  . Morphine Sulfate Other (See Comments)    BP bottoms out  . Penicillins Diarrhea    Has patient had a PCN reaction causing immediate rash, facial/tongue/throat swelling, SOB or lightheadedness with hypotension: Yes Has patient had a PCN reaction causing severe rash  involving mucus membranes or skin necrosis: Yes Has patient had a PCN reaction that required hospitalization No Has patient had a PCN reaction occurring within the last 10 years: No If all of the above answers are "NO", then may proceed with Cephalosporin use.    Current Outpatient Medications:  .  budesonide (ENTOCORT EC) 3 MG 24 hr capsule, Take 1 capsule (3 mg total) by mouth daily., Disp: 90 capsule, Rfl: 1 .  Cholecalciferol (VITAMIN D) 2000 UNITS CAPS, Take 1 capsule by mouth daily. , Disp: , Rfl:  .  ELIQUIS 5 MG TABS tablet, TAKE ONE TABLET BY MOUTH TWICE A DAY, Disp: 120 tablet, Rfl: 2 .  furosemide (LASIX) 20 MG tablet, TAKE ONE TABLET BY MOUTH DAILY AS NEEDED, Disp: 90 tablet, Rfl: 0 .  glipiZIDE (GLUCOTROL XL) 5 MG 24 hr tablet, TAKE ONE TABLET BY MOUTH DAILY WITH BREAKFAST. , Disp: 90 tablet, Rfl: 2 .  lisinopril (PRINIVIL,ZESTRIL) 5 MG tablet, Take 1 tablet (5 mg total) by mouth daily., Disp: 90 tablet, Rfl: 3 .  metFORMIN (GLUCOPHAGE) 500 MG tablet, Take 1 tablet (500 mg total) by mouth 2 (two) times daily with a meal. TAKE ONE TABLET IN THE MORNING AND TWO TABLETS IN THE EVENING, Disp: 180 tablet, Rfl: 3 .  metoprolol succinate (TOPROL-XL) 25 MG 24 hr tablet, TAKE ONE TABLET BY MOUTH DAILY, Disp: 90 tablet, Rfl: 3 .  Multiple Vitamin tablet, Take 1 tablet by mouth every evening. Reported on  03/13/2015, Disp: , Rfl:  .  simvastatin (ZOCOR) 20 MG tablet, TAKE 1 TABLET BY MOUTH AT BEDTIME, Disp: 30 tablet, Rfl: 12  Review of Systems  Constitutional: Negative.   Cardiovascular: Negative.   Skin: Positive for wound.   Social History   Tobacco Use  . Smoking status: Former Smoker    Last attempt to quit: 03/09/1978    Years since quitting: 39.5  . Smokeless tobacco: Never Used  Substance Use Topics  . Alcohol use: Yes    Alcohol/week: 0.6 oz    Types: 1 Glasses of wine per week    Comment: none since hospitalization   Objective:   BP 126/66 (BP Location: Left Arm, Patient  Position: Sitting, Cuff Size: Normal)   Pulse 80   Temp 97.6 F (36.4 C) (Oral)   Resp 16   Wt 166 lb 12.8 oz (75.7 kg)   SpO2 97%   BMI 27.76 kg/m  Vitals:   09/18/17 1326  BP: 126/66  Pulse: 80  Resp: 16  Temp: 97.6 F (36.4 C)  TempSrc: Oral  SpO2: 97%  Weight: 166 lb 12.8 oz (75.7 kg)   Physical Exam  Skin:  No further tenderness or heat. Erythema has faded.     Media Information         Document Information   Photos    09/18/2017 13:54  Attached To:  Office Visit on 09/18/17 with Darrin Apodaca, Vickki Muff, PA   Source Information   Adger Cantera, Vickki Muff, [place]  [facility]      Assessment & Plan:     1. Laceration of left lower leg with complication, subsequent encounter Healing well with clearing of infection. No further pain and no fever. Finish all the Doxycycline and schedule AWE in October or November.       [name], PA  [facility]

## 2017-09-26 IMAGING — MR MR HEAD WO/W CM
13 series · 48 of 48 positions shown · IV contrast (multihance)
Comparison: CT head 10/26/2015.

CLINICAL DATA: Seizures. Prior syncopal episode. Diabetes and
hypertension. Subsequent encounter.

EXAM:
MRI HEAD WITHOUT AND WITH CONTRAST
TECHNIQUE: Multiplanar, multiecho pulse sequences of the brain and surrounding
structures were obtained without and with intravenous contrast.
CONTRAST:  15mL MULTIHANCE GADOBENATE DIMEGLUMINE 529 MG/ML IV SOLN

[Series 2: T1 · sagittal · 5.0mm · 0.45mm/px · 3 of 25 slices shown (1 of 2)]
[im 1/25]
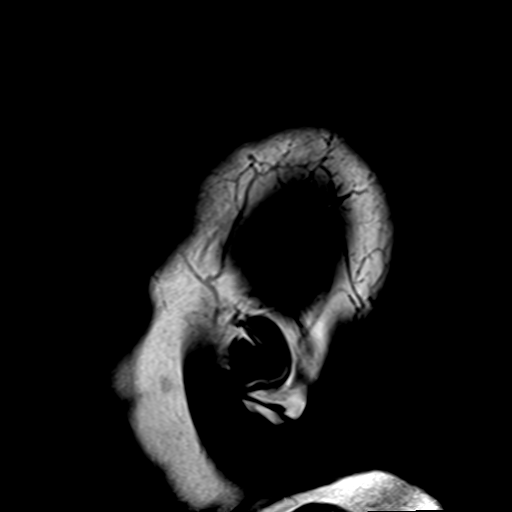
[im 13/25]
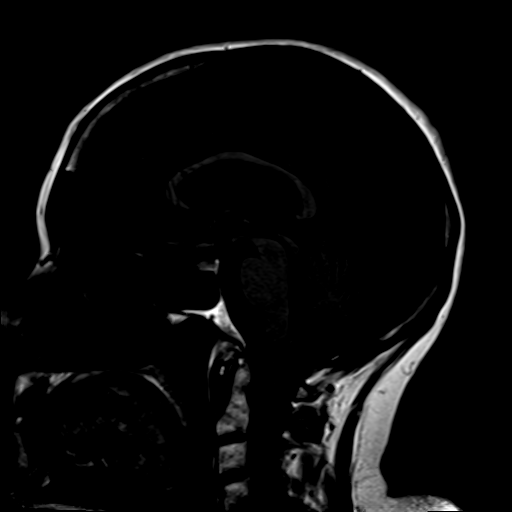
[im 25/25]
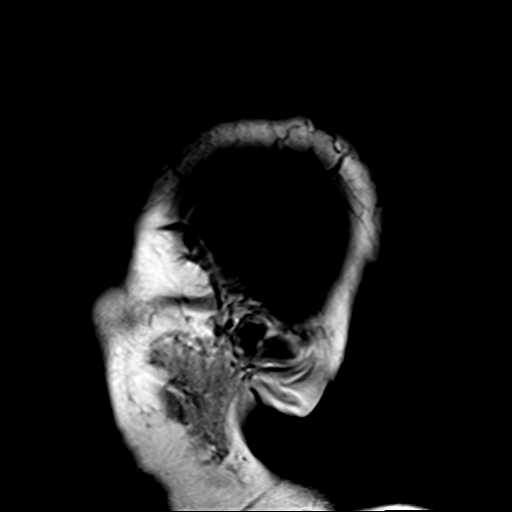

[Series 7: T2 · axial · 5.0mm · 0.60mm/px · z∈[-69,+87]mm · 2 of 25 slices shown (1 of 3)]
[im 1/25]
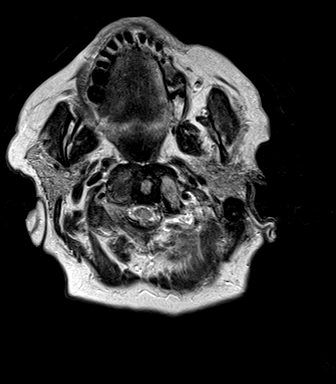
[im 25/25]
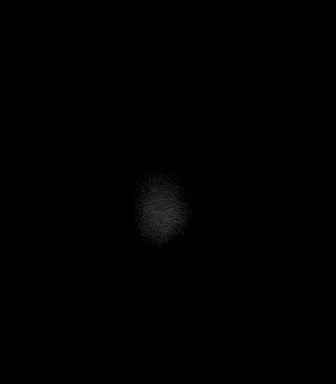

[Series 8: FLAIR · axial · 5.0mm · 0.45mm/px · z∈[-69,+87]mm · 2 of 25 slices shown]
[im 1/25]
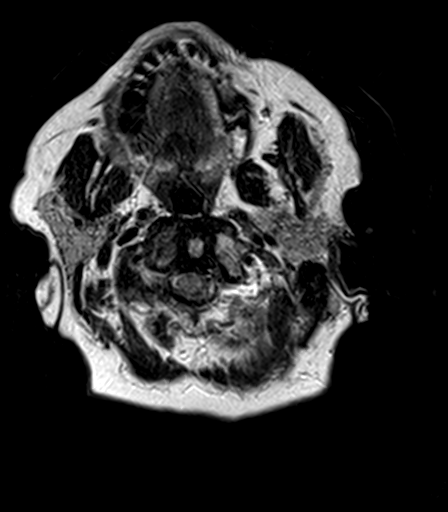
[im 25/25]
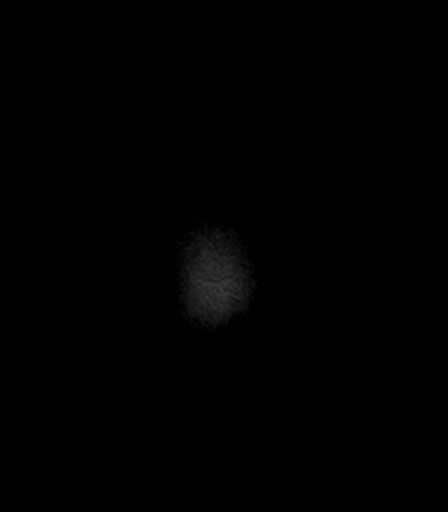

[Series 9: T2 · axial · 5.0mm · 0.45mm/px · z∈[-69,+87]mm · 2 of 25 slices shown (2 of 3)]
[im 1/25]
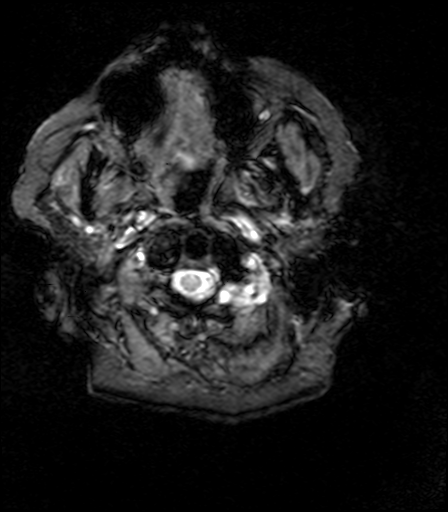
[im 25/25]
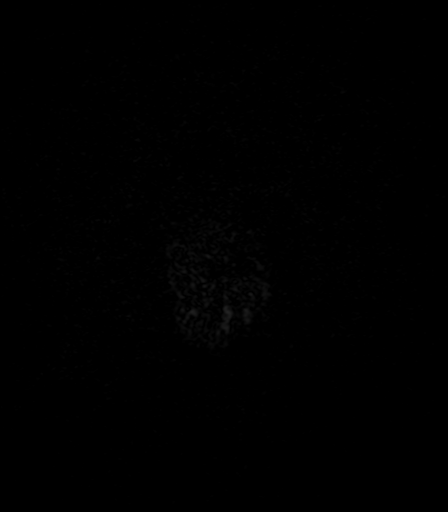

[Series 10: T1 · axial · 3.0mm · 1.00mm/px · z∈[-79,+98]mm · 6 of 60 slices shown (2 of 2)]
[im 1/60]
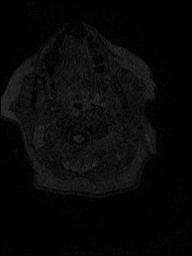
[im 12/60]
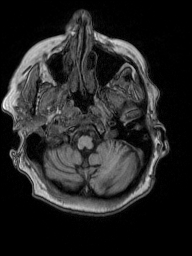
[im 24/60]
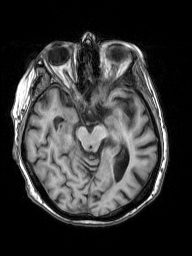
[im 36/60]
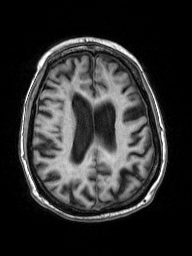
[im 48/60]
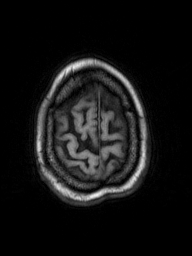
[im 60/60]
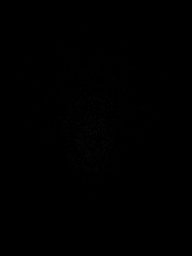

[Series 11: T2 · coronal · 4.0mm · 0.60mm/px · 3 of 31 slices shown (3 of 3)]
[im 1/31]
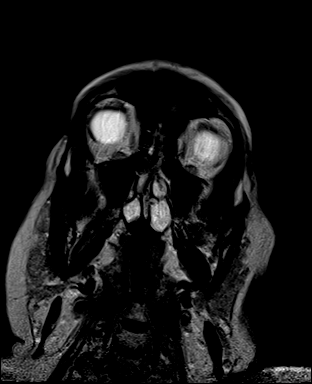
[im 16/31]
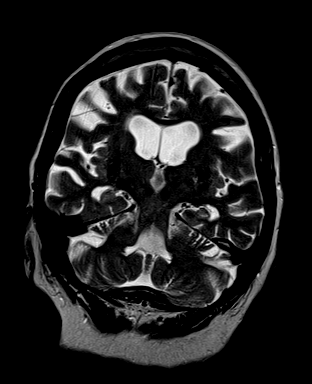
[im 31/31]
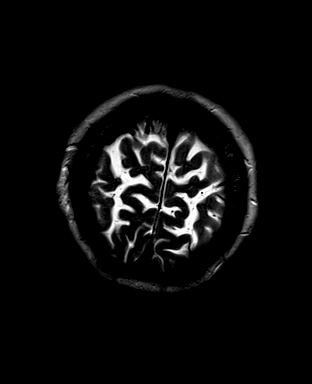

[Series 12: T2 post-contrast · coronal · 5.0mm · 0.49mm/px · 3 of 30 slices shown]
[im 1/30]
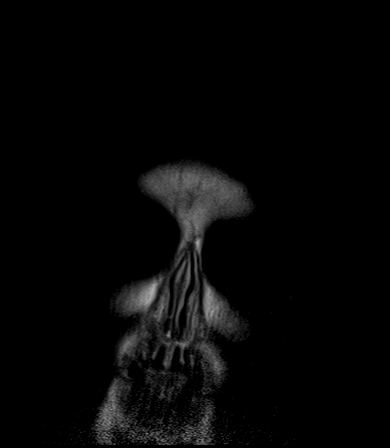
[im 15/30]
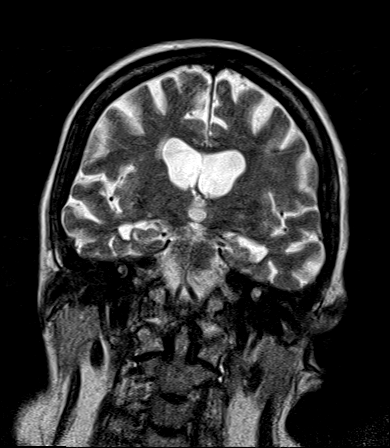
[im 30/30]
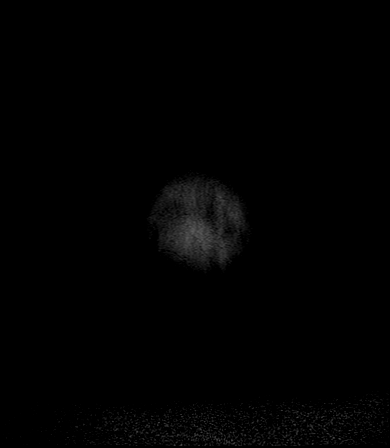

[Series 13: T1 post-contrast · axial · 3.0mm · 1.00mm/px · z∈[-79,+98]mm · 6 of 60 slices shown (1 of 2)]
[im 1/60]
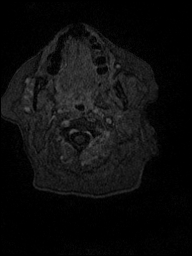
[im 12/60]
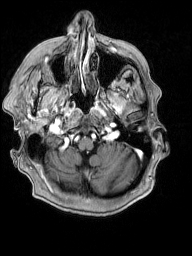
[im 24/60]
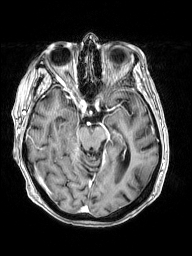
[im 36/60]
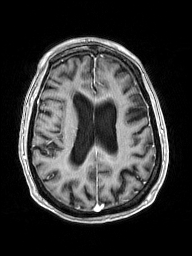
[im 48/60]
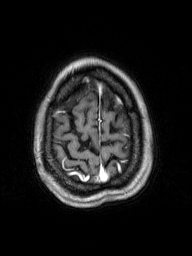
[im 60/60]
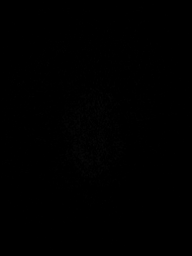

[Series 14: T1 post-contrast · coronal · 5.0mm · 0.43mm/px · 3 of 30 slices shown (2 of 2)]
[im 1/30]
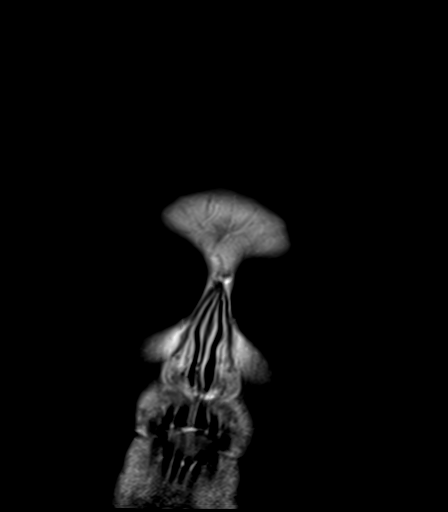
[im 15/30]
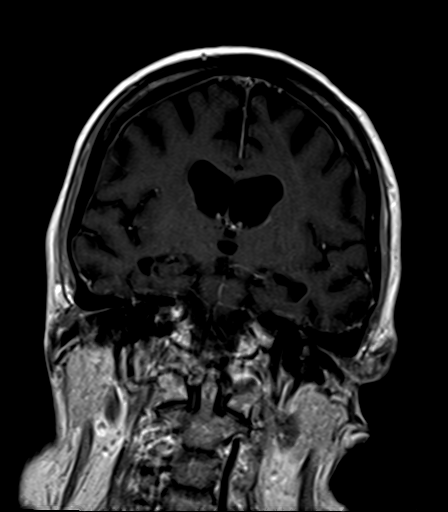
[im 30/30]
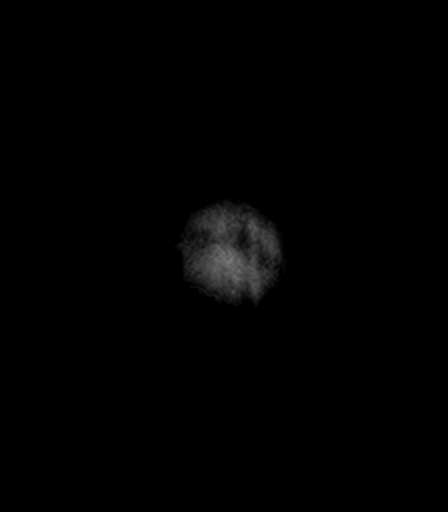

[Series 100: DWI · axial · 3.0mm · 1.80mm/px · z∈[-72,+90]mm · 5 of 55 slices shown (1 of 2)]
[im 1/55]
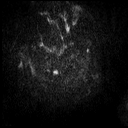
[im 14/55]
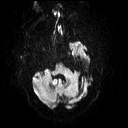
[im 28/55]
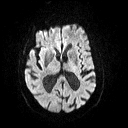
[im 41/55]
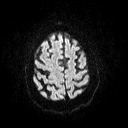
[im 55/55]
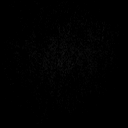

[Series 101: ADC · axial · 3.0mm · 1.80mm/px · z∈[-72,+87]mm · 5 of 54 slices shown (1 of 2)]
[im 1/54]
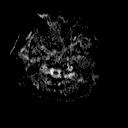
[im 14/54]
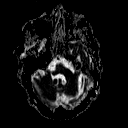
[im 27/54]
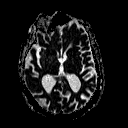
[im 40/54]
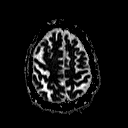
[im 54/54]
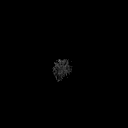

[Series 102: DWI · coronal · 3.0mm · 1.80mm/px · 4 of 48 slices shown (2 of 2)]
[im 1/48]
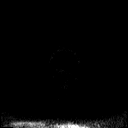
[im 16/48]
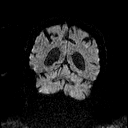
[im 32/48]
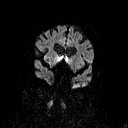
[im 48/48]
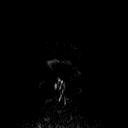

[Series 103: ADC · coronal · 3.0mm · 1.80mm/px · 4 of 48 slices shown (2 of 2)]
[im 1/48]
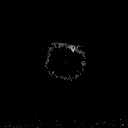
[im 16/48]
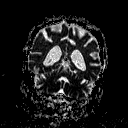
[im 32/48]
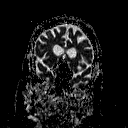
[im 48/48]
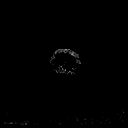

[48 of 48 positions shown; findings below may reference images not displayed]

FINDINGS: No evidence for acute infarction, hemorrhage, mass lesion, or
extra-axial fluid. For global atrophy. Hydrocephalus ex vacuo. Mild
subcortical and periventricular T2 and FLAIR hyperintensities,
likely chronic microvascular ischemic change.

Flow voids are maintained throughout the carotid, basilar, and
vertebral arteries. Tiny focus of chronic hemorrhage RIGHT thalamus
likely related to hypertensive vascular disease.

Thin-section imaging through the temporal lobes demonstrates no
asymmetry or mass. No inflammatory process.

Post infusion, no abnormal enhancement of the brain or meninges.

Normal pituitary and cerebellar tonsils. Cervical spondylosis
suspected at C4-C5.

Visualized calvarium, skull base, and upper cervical osseous
structures unremarkable. Orbits sinuses, and mastoids show no acute
process. RIGHT cataract extraction. Resolving midline frontal scalp
hematoma.

Compared with recent CT, no change.
IMPRESSION: No acute intracranial findings.  Atrophy and small vessel disease.

No abnormal postcontrast enhancement

Aside from symmetric atrophy, unremarkable temporal lobes.

## 2017-10-12 ENCOUNTER — Encounter: Payer: Self-pay | Admitting: Gastroenterology

## 2017-10-12 ENCOUNTER — Ambulatory Visit: Payer: Medicare Other | Admitting: Gastroenterology

## 2017-10-12 VITALS — BP 133/75 | HR 75 | Resp 17 | Wt 169.0 lb

## 2017-10-12 DIAGNOSIS — K52831 Collagenous colitis: Secondary | ICD-10-CM

## 2017-10-12 NOTE — Progress Notes (Signed)
Cephas Darby, MD [address]  [place]  [place], [place] [address]  Main: [phone]  Fax: [phone]    Gastroenterology Consultation  Referring Provider:     Margo Common, PA Primary Care Physician:  Margo Common, PA Primary Gastroenterologist:  Dr. Cephas Darby Reason for Consultation:   Collagenous colitis        HPI:   [name] is a 84 y.o. female referred by Dr. Margo Common, PA  for consultation & management of chronic diarrhea. She has history of paroxysmal A. Fib, currently on apixaban and metoprolol. She is diagnosed with collagenous colitis by Dr. Allen Norris in 2015 when she had a colonoscopy for chronic diarrhea. She has been on Entocort 3 pills daily since then which brought her to remission. She was having 1 formed bowel movement daily. Patient self tapered the medication due to cost to 2 pills a day for 1 month then one pill a day for one month. She stopped taking Entocort about 3-4 months ago. She reports having recurrence of diarrhea, nonbloody, runny liquid since one & half months. She is taking Imodium as needed which has improved consistency but not back to normal. She also had episodes of incontinence during nighttime. She has been going through stress lately as her son-in-law passed away from metastatic appendiceal carcinoma. She lost weight intentionally and also secondary to stress. She reports that her weight has been stable now and coping up with stress well. Her daughter is with her now. She otherwise denies abdominal pain, bloating, nausea, vomiting, rectal bleeding or hematochezia or melena. Her most recent labs have been unremarkable. There is no evidence of anemia.  Follow-up visit 06/24/2017: She started Entocort 3 mg 3 pills daily. Currently having one formed bowel movement daily. She wants to know if she can decrease the dose to 2 pills daily. She otherwise does not have any complaints today  Follow up visit  10/12/17: Diarrhea resolved, entocort at [facility] daily. Having 1 formed BM daily. Has loose stool when she has corn or beans.   NSAIDs: none  Antiplts/Anticoagulants/Anti thrombotics: apixaban for paroxysmal A. fib  GI Procedures:  Colonoscopy in 2015 by Dr. Allen Norris Diverticulosis, internal hemorrhoids, random colon biopsies revealed collagenous colitis  Past Medical History:  Diagnosis Date  . Chronic diarrhea   . Collagenous colitis   . Diabetes mellitus without complication ([place])   . Diverticulitis   . History of echocardiogram    a. 11/2015: echo showing EF of 60-65% with no WMA. Grade 1 DD and mild MR noted.   . Hypertension   . PAF (paroxysmal atrial fibrillation) ([place])    a. initially diagnosed in 08/2016 --> started on Eliquis  . Syncope    a. initially occurring in Fall 2016 b. Hospitalized in 10/2015 and 11/2015 for recurrent episodes.     Past Surgical History:  Procedure Laterality Date  . ABDOMINAL HYSTERECTOMY    . BACK SURGERY  08/08/2008   Lumbar discectomy  . NECK SURGERY  1980    Family History  Problem Relation Age of Onset  . Hypertension Mother   . Diabetes Mother   . Lung cancer Father      Social History   Tobacco Use  . Smoking status: Former Smoker    Last attempt to quit: 03/09/1978    Years since quitting: 39.6  . Smokeless tobacco: Never Used  Substance Use Topics  . Alcohol use: Yes    Alcohol/week: 0.6 oz  Types: 1 Glasses of wine per week    Comment: none since hospitalization  . Drug use: No    Allergies as of 10/12/2017 - Review Complete 10/12/2017  Allergen Reaction Noted  . Morphine sulfate Other (See Comments) 07/12/2014  . Penicillins Diarrhea 07/12/2014    Review of Systems:    All systems reviewed and negative except where noted in HPI.   Physical Exam:  BP 133/75 (BP Location: Left Arm, Patient Position: Sitting, Cuff Size: Large)   Pulse 75   Resp 17   Wt 169 lb (76.7 kg)   BMI 28.12 kg/m  No LMP recorded.  Patient has had a hysterectomy.  General:   Alert,  Well-developed, well-nourished, pleasant and cooperative in NAD Head:  Normocephalic and atraumatic. Eyes:  Sclera clear, no icterus.   Conjunctiva pink. Ears:  Normal auditory acuity. Nose:  No deformity, discharge, or lesions. Mouth:  No deformity or lesions,oropharynx pink & moist. Neck:  Supple; no masses or thyromegaly. Lungs:  Respirations even and unlabored.  Clear throughout to auscultation.   No wheezes, crackles, or rhonchi. No acute distress. Heart:  Regular rate and rhythm; no murmurs, clicks, rubs, or gallops. Abdomen:  Normal bowel sounds. Soft, non-tender and non-distended without masses, hepatosplenomegaly or hernias noted.  No guarding or rebound tenderness.   Rectal: Not performed Msk:  Symmetrical without gross deformities. Good, equal movement & strength bilaterally. Pulses:  Normal pulses noted. Extremities:  No clubbing or edema.  No cyanosis. Neurologic:  Alert and oriented x3;  grossly normal neurologically. Skin:  Intact without significant lesions or rashes. No jaundice. Psych:  Alert and cooperative. Normal mood and affect.  Imaging Studies: No recent abdominal imaging  Assessment and Plan:   [name] is a 84 y.o. female with collagenous colitis, in remission on Entocort. Her infectious workup came back negative. Normal fecal calprotectin  - Continue entocort 1 pill daily  - Told her that our goal to taper to the lowest dose that keeps her diarrhea in remission   Follow up in 6 months   Cephas Darby, MD

## 2017-12-07 LAB — HM DIABETES EYE EXAM

## 2017-12-11 ENCOUNTER — Encounter: Payer: Self-pay | Admitting: Family Medicine

## 2017-12-28 ENCOUNTER — Emergency Department
Admission: EM | Admit: 2017-12-28 | Discharge: 2017-12-29 | Disposition: A | Discharge status: Home / Self Care | Payer: Medicare Other | Attending: Emergency Medicine | Admitting: Emergency Medicine

## 2017-12-28 ENCOUNTER — Other Ambulatory Visit: Payer: Self-pay

## 2017-12-28 ENCOUNTER — Encounter: Payer: Self-pay | Admitting: *Deleted

## 2017-12-28 ENCOUNTER — Emergency Department: Payer: Medicare Other

## 2017-12-28 DIAGNOSIS — S0990XA Unspecified injury of head, initial encounter: Secondary | ICD-10-CM | POA: Insufficient documentation

## 2017-12-28 DIAGNOSIS — Y999 Unspecified external cause status: Secondary | ICD-10-CM | POA: Insufficient documentation

## 2017-12-28 DIAGNOSIS — E119 Type 2 diabetes mellitus without complications: Secondary | ICD-10-CM | POA: Insufficient documentation

## 2017-12-28 DIAGNOSIS — S20301A Unspecified superficial injuries of right front wall of thorax, initial encounter: Secondary | ICD-10-CM | POA: Diagnosis present

## 2017-12-28 DIAGNOSIS — S81811A Laceration without foreign body, right lower leg, initial encounter: Secondary | ICD-10-CM | POA: Insufficient documentation

## 2017-12-28 DIAGNOSIS — S1093XA Contusion of unspecified part of neck, initial encounter: Secondary | ICD-10-CM | POA: Insufficient documentation

## 2017-12-28 DIAGNOSIS — Z7901 Long term (current) use of anticoagulants: Secondary | ICD-10-CM | POA: Diagnosis not present

## 2017-12-28 DIAGNOSIS — Y92410 Unspecified street and highway as the place of occurrence of the external cause: Secondary | ICD-10-CM | POA: Insufficient documentation

## 2017-12-28 DIAGNOSIS — Y939 Activity, unspecified: Secondary | ICD-10-CM | POA: Insufficient documentation

## 2017-12-28 DIAGNOSIS — I10 Essential (primary) hypertension: Secondary | ICD-10-CM | POA: Diagnosis not present

## 2017-12-28 DIAGNOSIS — Z87891 Personal history of nicotine dependence: Secondary | ICD-10-CM | POA: Insufficient documentation

## 2017-12-28 DIAGNOSIS — S20211A Contusion of right front wall of thorax, initial encounter: Secondary | ICD-10-CM

## 2017-12-28 DIAGNOSIS — S41111A Laceration without foreign body of right upper arm, initial encounter: Secondary | ICD-10-CM | POA: Insufficient documentation

## 2017-12-28 DIAGNOSIS — S3991XA Unspecified injury of abdomen, initial encounter: Secondary | ICD-10-CM | POA: Diagnosis not present

## 2017-12-28 DIAGNOSIS — S51811A Laceration without foreign body of right forearm, initial encounter: Secondary | ICD-10-CM | POA: Insufficient documentation

## 2017-12-28 LAB — CBC
HCT: 41.4 % (ref 36.0–46.0)
Hemoglobin: 13.6 g/dL (ref 12.0–15.0)
MCH: 30.2 pg (ref 26.0–34.0)
MCHC: 32.9 g/dL (ref 30.0–36.0)
MCV: 91.8 fL (ref 80.0–100.0)
PLATELETS: 257 10*3/uL (ref 150–400)
RBC: 4.51 MIL/uL (ref 3.87–5.11)
RDW: 12.4 % (ref 11.5–15.5)
WBC: 11 10*3/uL — ABNORMAL HIGH (ref 4.0–10.5)
nRBC: 0 % (ref 0.0–0.2)

## 2017-12-28 LAB — BASIC METABOLIC PANEL
Anion gap: 10 (ref 5–15)
BUN: 22 mg/dL (ref 8–23)
CO2: 26 mmol/L (ref 22–32)
CREATININE: 1.11 mg/dL — AB (ref 0.44–1.00)
Calcium: 9.5 mg/dL (ref 8.9–10.3)
Chloride: 105 mmol/L (ref 98–111)
GFR calc Af Amer: 51 mL/min — ABNORMAL LOW (ref >60)
GFR calc non Af Amer: 44 mL/min — ABNORMAL LOW (ref >60)
GLUCOSE: 166 mg/dL — AB (ref 70–99)
Potassium: 4 mmol/L (ref 3.5–5.1)
SODIUM: 141 mmol/L (ref 135–145)

## 2017-12-28 MED ORDER — SODIUM CHLORIDE 0.9 % IV BOLUS
1000.0000 mL | Freq: Once | INTRAVENOUS | Status: AC
  Administered 2017-12-28: 1000 mL via INTRAVENOUS

## 2017-12-28 MED ORDER — TRAMADOL HCL 50 MG PO TABS: 50.0000 mg | ORAL_TABLET | Freq: Once | ORAL | Status: DC

## 2017-12-28 MED ORDER — TRAMADOL HCL 50 MG PO TABS: 50.0000 mg | ORAL_TABLET | Freq: Four times a day (QID) | ORAL | 0 refills | Status: DC | PRN

## 2017-12-28 MED ORDER — IOPAMIDOL (ISOVUE-370) INJECTION 76%
75.0000 mL | Freq: Once | INTRAVENOUS | Status: AC | PRN
  Administered 2017-12-28: 100 mL via INTRAVENOUS

## 2017-12-28 MED ORDER — FENTANYL CITRATE (PF) 100 MCG/2ML IJ SOLN
75.0000 ug | Freq: Once | INTRAMUSCULAR | Status: AC
  Administered 2017-12-28: 75 ug via INTRAVENOUS
  Filled 2017-12-28: qty 2
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
## 2017-12-28 NOTE — Discharge Instructions (Addendum)
Please follow up with your primary care provider this week for a recheck. Return to the ER for symptoms of concern if unable to schedule an appointment.
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
## 2017-12-28 NOTE — ED Notes (Signed)
Provider at bedside

## 2017-12-28 NOTE — ED Notes (Signed)
Pt to CT via stretcher accomp by CT tech 
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
## 2017-12-29 ENCOUNTER — Encounter: Payer: Medicare Other | Admitting: Family Medicine

## 2017-12-29 ENCOUNTER — Ambulatory Visit: Payer: Medicare Other

## 2017-12-29 MED ORDER — TRAMADOL HCL 50 MG PO TABS
50.0000 mg | ORAL_TABLET | Freq: Once | ORAL | Status: AC
  Administered 2017-12-29: 50 mg via ORAL
  Filled 2017-12-29: qty 1
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
## 2017-12-30 ENCOUNTER — Telehealth: Payer: Self-pay | Admitting: Family Medicine

## 2017-12-30 NOTE — Telephone Encounter (Signed)
Merry Proud with [facility] asking for skilled nursing 2 times a week for wound care.  Also to [facility] for PT services  BC#  [phone]  Thanks  teri
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
## 2018-01-07 ENCOUNTER — Telehealth: Payer: Self-pay | Admitting: Family Medicine

## 2018-01-07 NOTE — Telephone Encounter (Signed)
Ask if this is for the large skin tear on her right arm, or another situation?
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
## 2018-01-07 NOTE — Telephone Encounter (Signed)
Please advise 

## 2018-01-07 NOTE — Telephone Encounter (Signed)
[name], [place] w/ [place] [phone]  Needing PT Verbal Orders for:  2 times a week for 4 weeks    Please advise.  Thanks, [employer]
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
## 2018-01-07 NOTE — Telephone Encounter (Signed)
LMTCB for information per dennis.  dbs

## 2018-01-07 NOTE — Telephone Encounter (Signed)
Thank you. Authorize verbal order for PT 2 times a week for 4 weeks to treat right rib contusion and help with balance to prevent falls. She is scheduled for a follow up appointment in the office on 01-11-18.

## 2018-01-08 NOTE — Telephone Encounter (Signed)
Please advise 

## 2018-01-08 NOTE — Telephone Encounter (Signed)
Left detailed message on [name] from [place] [facility] with verbal order approval per Simona Huh and if any questions to call us back

## 2018-01-08 NOTE — Telephone Encounter (Signed)
Pt called back wanting to know if anything had been sent in yet to the pharmacy  CB#  [phone]  Thanks  teri

## 2018-01-11 ENCOUNTER — Other Ambulatory Visit: Payer: Self-pay

## 2018-01-11 ENCOUNTER — Ambulatory Visit: Payer: Medicare Other | Admitting: Family Medicine

## 2018-01-11 ENCOUNTER — Encounter: Payer: Self-pay | Admitting: Family Medicine

## 2018-01-11 VITALS — BP 130/68 | HR 104 | Temp 97.4°F | Ht 61.5 in | Wt 164.2 lb

## 2018-01-11 DIAGNOSIS — T148XXA Other injury of unspecified body region, initial encounter: Secondary | ICD-10-CM | POA: Diagnosis not present

## 2018-01-11 DIAGNOSIS — E119 Type 2 diabetes mellitus without complications: Secondary | ICD-10-CM | POA: Diagnosis not present

## 2018-01-11 DIAGNOSIS — S20211D Contusion of right front wall of thorax, subsequent encounter: Secondary | ICD-10-CM | POA: Diagnosis not present

## 2018-01-11 DIAGNOSIS — Z23 Encounter for immunization: Secondary | ICD-10-CM

## 2018-01-11 MED ORDER — TRAMADOL HCL 50 MG PO TABS: 50.0000 mg | ORAL_TABLET | Freq: Four times a day (QID) | ORAL | 0 refills | Status: DC | PRN

## 2018-01-11 MED ORDER — GLIPIZIDE ER 5 MG PO TB24: ORAL_TABLET | ORAL | 2 refills | Status: DC
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
## 2018-01-13 ENCOUNTER — Other Ambulatory Visit: Payer: Self-pay | Admitting: Internal Medicine

## 2018-01-13 ENCOUNTER — Other Ambulatory Visit: Payer: Self-pay

## 2018-01-13 NOTE — Telephone Encounter (Signed)
Please review for refill. Thanks!  

## 2018-01-18 ENCOUNTER — Ambulatory Visit: Payer: Self-pay | Admitting: Family Medicine

## 2018-01-19 ENCOUNTER — Other Ambulatory Visit: Payer: Self-pay

## 2018-01-19 ENCOUNTER — Ambulatory Visit: Payer: Medicare Other

## 2018-01-19 ENCOUNTER — Ambulatory Visit: Payer: Medicare Other | Admitting: Family Medicine

## 2018-01-19 ENCOUNTER — Encounter: Payer: Self-pay | Admitting: Family Medicine

## 2018-01-19 VITALS — BP 118/62 | HR 76 | Temp 97.6°F | Ht 61.5 in | Wt 164.2 lb

## 2018-01-19 DIAGNOSIS — S20211D Contusion of right front wall of thorax, subsequent encounter: Secondary | ICD-10-CM

## 2018-01-19 DIAGNOSIS — T148XXA Other injury of unspecified body region, initial encounter: Secondary | ICD-10-CM | POA: Diagnosis not present
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
## 2018-01-21 ENCOUNTER — Telehealth: Payer: Self-pay | Admitting: Family Medicine

## 2018-01-21 ENCOUNTER — Other Ambulatory Visit: Payer: Self-pay | Admitting: Family Medicine

## 2018-01-21 DIAGNOSIS — S20211D Contusion of right front wall of thorax, subsequent encounter: Secondary | ICD-10-CM

## 2018-01-21 MED ORDER — TRAMADOL HCL 50 MG PO TABS: 50.0000 mg | ORAL_TABLET | Freq: Four times a day (QID) | ORAL | 0 refills | Status: DC | PRN

## 2018-01-21 NOTE — Telephone Encounter (Signed)
Done

## 2018-01-21 NOTE — Telephone Encounter (Signed)
Patient has called requesting a refill on her Tramadol. Does not want to run out over the weekend.   Please call to [place].

## 2018-01-27 ENCOUNTER — Telehealth: Payer: Self-pay | Admitting: Family Medicine

## 2018-01-27 ENCOUNTER — Telehealth: Payer: Self-pay

## 2018-01-27 NOTE — Telephone Encounter (Signed)
Needing a call back from [facility] regarding her AWV.  Thanks, [employer]

## 2018-01-27 NOTE — Telephone Encounter (Signed)
LM requesting a CB to scheduled AWV.  -MM

## 2018-01-28 NOTE — Telephone Encounter (Signed)
Pt was returning a call to schedule her AWV. Scheduled for 02/09/18 @ 120 PM. -MM

## 2018-02-03 ENCOUNTER — Ambulatory Visit: Admit: 2018-02-03 | Payer: Medicare Other | Admitting: Ophthalmology

## 2018-02-03 SURGERY — PHACOEMULSIFICATION, CATARACT, WITH IOL INSERTION
Anesthesia: Choice | Laterality: Left

## 2018-02-09 ENCOUNTER — Ambulatory Visit (INDEPENDENT_AMBULATORY_CARE_PROVIDER_SITE_OTHER): Payer: Medicare Other

## 2018-02-09 ENCOUNTER — Telehealth: Payer: Self-pay

## 2018-02-09 VITALS — BP 140/62 | HR 66 | Temp 97.9°F | Ht 65.0 in | Wt 166.0 lb

## 2018-02-09 DIAGNOSIS — Z0000 Encounter for general adult medical examination without abnormal findings: Secondary | ICD-10-CM

## 2018-02-09 NOTE — Progress Notes (Addendum)
Subjective:   [name] is a 84 y.o. female who presents for an Initial Medicare Annual Wellness Visit.  Review of Systems    N/A  Cardiac Risk Factors include: advanced age (>18men, >84 women);diabetes mellitus;dyslipidemia;hypertension     Objective:    Today's Vitals   02/09/18 1322  BP: 140/62  Pulse: 66  Temp: 97.9 F (36.6 C)  TempSrc: Oral  Weight: 166 lb (75.3 kg)  Height: 5\' 5"  (1.651 m)  PainSc: 2   PainLoc: Hip   Body mass index is 27.62 kg/m.  Advanced Directives 02/09/2018 11/21/2015 11/21/2015 10/26/2015 09/15/2015 03/13/2015 07/19/2014  Does Patient Have a Medical Advance Directive? Yes Yes Yes Yes Yes Yes Yes  Type of Paramedic of [place];Living will [place];Living will [place];Living will Healthcare Power of [place];Living will Living will;Healthcare Power of [place];Living will  Does patient want to make changes to medical advance directive? - No - Patient declined - No - Patient declined No - Patient declined - No - Patient declined  Copy of [place] in Chart? No - copy requested No - copy requested No - copy requested No - copy requested - - No - copy requested    Current Medications (verified) Outpatient Encounter Medications as of 02/09/2018  Medication Sig  . budesonide (ENTOCORT EC) 3 MG 24 hr capsule Take 1 capsule (3 mg total) by mouth daily.  . Cholecalciferol (VITAMIN D) 2000 UNITS CAPS Take 1 capsule by mouth daily.   . diphenhydramine-acetaminophen (TYLENOL PM) 25-500 MG TABS tablet Take 1 tablet by mouth at bedtime as needed.  Marland Kitchen ELIQUIS 5 MG TABS tablet TAKE ONE TABLET BY MOUTH TWICE A DAY  . furosemide (LASIX) 20 MG tablet TAKE ONE TABLET BY MOUTH DAILY AS NEEDED  . glipiZIDE (GLUCOTROL XL) 5 MG 24 hr tablet TAKE ONE TABLET BY MOUTH DAILY WITH BREAKFAST.  Marland Kitchen lisinopril (PRINIVIL,ZESTRIL) 5 MG  tablet Take 1 tablet (5 mg total) by mouth daily.  . metFORMIN (GLUCOPHAGE) 500 MG tablet Take 1 tablet (500 mg total) by mouth 2 (two) times daily with a meal. TAKE ONE TABLET IN THE MORNING AND TWO TABLETS IN THE EVENING (Patient taking differently: Take 500 mg by mouth 2 (two) times daily with a meal. )  . metoprolol succinate (TOPROL-XL) 25 MG 24 hr tablet TAKE ONE TABLET BY MOUTH DAILY  . Multiple Vitamin tablet Take 1 tablet by mouth every evening. Reported on 03/13/2015  . simvastatin (ZOCOR) 20 MG tablet TAKE 1 TABLET BY MOUTH AT BEDTIME  . traMADol (ULTRAM) 50 MG tablet Take 1 tablet (50 mg total) by mouth every 6 (six) hours as needed.   No facility-administered encounter medications on file as of 02/09/2018.     Allergies (verified) Morphine sulfate and Penicillins   History: Past Medical History:  Diagnosis Date  . Chronic diarrhea   . Collagenous colitis   . Diabetes mellitus without complication ([place])   . Diverticulitis   . History of echocardiogram    a. 11/2015: echo showing EF of 60-65% with no WMA. Grade 1 DD and mild MR noted.   . Hypertension   . PAF (paroxysmal atrial fibrillation) ([place])    a. initially diagnosed in 08/2016 --> started on Eliquis  . Syncope    a. initially occurring in Fall 2016 b. Hospitalized in 10/2015 and 11/2015 for recurrent episodes.    Past Surgical History:  Procedure Laterality  Date  . ABDOMINAL HYSTERECTOMY    . BACK SURGERY  08/08/2008   Lumbar discectomy  . NECK SURGERY  1980   Family History  Problem Relation Age of Onset  . Hypertension Mother   . Diabetes Mother   . Lung cancer Father    Social History   Socioeconomic History  . Marital status: Widowed    Spouse name: Not on file  . Number of children: 2  . Years of education: Not on file  . Highest education level: Associate degree: academic program  Occupational History  . Occupation: retired  Scientific laboratory technician  . Financial resource strain: Not hard at all  . Food  insecurity:    Worry: Never true    Inability: Never true  . Transportation needs:    Medical: No    Non-medical: No  Tobacco Use  . Smoking status: Former Smoker    Last attempt to quit: 03/09/1978    Years since quitting: 39.9  . Smokeless tobacco: Never Used  Substance and Sexual Activity  . Alcohol use: Yes    Alcohol/week: 0.0 - 1.0 standard drinks  . Drug use: No  . Sexual activity: Not on file  Lifestyle  . Physical activity:    Days per week: 0 days    Minutes per session: 0 min  . Stress: To some extent  Relationships  . Social connections:    Talks on phone: Patient refused    Gets together: Patient refused    Attends religious service: Patient refused    Active member of club or organization: Patient refused    Attends meetings of clubs or organizations: Patient refused    Relationship status: Patient refused  Other Topics Concern  . Not on file  Social History Narrative  . Not on file    Tobacco Counseling Counseling given: Not Answered   Clinical Intake:  Pre-visit preparation completed: Yes  Pain : 0-10 Pain Score: 2  Pain Type: Acute pain Pain Location: Hip(and right cheek) Pain Orientation: Left Pain Descriptors / Indicators: Aching Pain Onset: More than a month ago Pain Frequency: Constant    Diabetes:  Is the patient diabetic?  Yes type 2 If diabetic, was a CBG obtained today?  No  Did the patient bring in their glucometer from home?  No  How often do you monitor your CBG's? Occasionally, not daily.   Financial Strains and Diabetes Management:  Are you having any financial strains with the device, your supplies or your medication? No .  Does the patient want to be seen by Chronic Care Management for management of their diabetes?  No  Would the patient like to be referred to a Nutritionist or for Diabetic Management?  No   Diabetic Exams:  Diabetic Eye Exam: Completed 12/07/17.   Diabetic Foot Exam: Completed 07/28/17.     Nutritional Status: BMI 25 -29 Overweight Nutritional Risks: None   How often do you need to have someone help you when you read instructions, pamphlets, or other written materials from your doctor or pharmacy?: 1 - Never  Interpreter Needed?: No  Information entered by :: [facility], LPN   Activities of Daily Living In your present state of health, do you have any difficulty performing the following activities: 02/09/2018 09/08/2017  Hearing? N N  Vision? N N  Comment Wears eye glasses.  -  Difficulty concentrating or making decisions? N N  Walking or climbing stairs? N Y  Dressing or bathing? N N  Doing errands, shopping? N  N  Preparing Food and eating ? N -  Using the Toilet? N -  In the past six months, have you accidently leaked urine? N -  Do you have problems with loss of bowel control? N -  Managing your Medications? N -  Managing your Finances? N -  Housekeeping or managing your Housekeeping? N -  Comment Has assistance cleaning home.  -  Some recent data might be hidden     Immunizations and Health Maintenance Immunization History  Administered Date(s) Administered  . Influenza Split 01/01/2009, 01/02/2010, 01/13/2011, 12/22/2011  . Influenza, High Dose Seasonal PF 01/18/2014, 01/01/2015, 11/30/2015, 12/01/2016, 01/11/2018  . Influenza-Unspecified 01/08/2013  . Pneumococcal Conjugate-13 10/19/2013  . Pneumococcal Polysaccharide-23 01/02/2010  . Td 11/02/2003, 01/13/2011  . Tdap 01/13/2011, 02/28/2015  . Zoster 07/14/2007   There are no preventive care reminders to display for this patient.  Patient Care Team: Chrismon, Vickki Muff, PA as PCP - General (Family Medicine) Elvina Mattes, Adele Schilder as Attending Physician (Podiatry) End, Harrell Gave, MD as Consulting Physician (Cardiology) Lin Landsman, MD as Consulting Physician (Gastroenterology) Leandrew Koyanagi, MD as Referring Physician (Ophthalmology) Dingeldein, Remo Lipps, MD  (Ophthalmology)  Indicate any recent Medical Services you may have received from other than [facility] providers in the past year (date may be approximate).     Assessment:   This is a routine wellness examination for [place].  Hearing/Vision screen No exam data present  Dietary issues and exercise activities discussed: Current Exercise Habits: The patient does not participate in regular exercise at present, Exercise limited by: orthopedic condition(s)  Goals    . DIET - INCREASE WATER INTAKE     Recommend to drink at least 6-8 8oz glasses of water per day.      Depression Screen PHQ 2/9 Scores 02/09/2018 01/19/2018 01/19/2018 01/11/2018 09/08/2017 08/29/2016 01/01/2015  PHQ - 2 Score 1 0 0 2 0 0 0  PHQ- 9 Score - - - - 3 6 -    Fall Risk Fall Risk  02/09/2018 01/19/2018 01/19/2018 01/11/2018 09/18/2017  Falls in the past year? 0 0 0 0 Yes  Number falls in past yr: - - - - 1  Injury with Fall? - - - - Yes    [place]:  Any stairs in or around the home WITH handrails? No  Home free of loose throw rugs in walkways, pet beds, electrical cords, etc? Yes  Adequate lighting in your home to reduce risk of falls? Yes   ASSISTIVE DEVICES UTILIZED TO PREVENT FALLS:  Life alert? Yes  Use of a cane, walker or w/c? Yes  Grab bars in the bathroom? Yes  Shower chair or bench in shower? No  Elevated toilet seat or a handicapped toilet? No    TIMED UP AND GO:  Was the test performed? No .    Cognitive Function:      6CIT Screen 02/09/2018  What Year? 0 points  What month? 0 points  What time? 0 points  Count back from 20 0 points  Months in reverse 0 points  Repeat phrase 4 points  Total Score 4    Screening Tests Health Maintenance  Topic Date Due  . HEMOGLOBIN A1C  03/14/2018  . FOOT EXAM  07/29/2018  . OPHTHALMOLOGY EXAM  12/08/2018  . TETANUS/TDAP  02/27/2025  . INFLUENZA VACCINE  Completed  . DEXA SCAN  Completed  . PNA vac Low Risk  Adult  Completed    Qualifies for Shingles Vaccine? Yes  Zostavax completed 07/14/07. Due for Shingrix. Education has been provided regarding the importance of this vaccine. Pt has been advised to call insurance company to determine out of pocket expense. Advised may also receive vaccine at local pharmacy or Health Dept. Verbalized acceptance and understanding.  Tdap: Up to date  Flu Vaccine: Up to date  Pneumococcal Vaccine: Up to date   Cancer Screenings:  Colorectal Screening: No longer required.   Mammogram: No longer required.   Bone Density: No longer required.   Lung Cancer Screening: (Low Dose CT Chest recommended if Age 20-80 years, 30 pack-year currently smoking OR have quit w/in 15years.) does not qualify.    Additional Screening:  Vision Screening: Recommended annual ophthalmology exams for early detection of glaucoma and other disorders of the eye.  Dental Screening: Recommended annual dental exams for proper oral hygiene  Community Resource Referral:  CRR required this visit?  No       Plan:  I have personally reviewed and addressed the Medicare Annual Wellness questionnaire and have noted the following in the patient's chart:  A. Medical and social history B. Use of alcohol, tobacco or illicit drugs  C. Current medications and supplements D. Functional ability and status E.  Nutritional status F.  Physical activity G. Advance directives H. List of other physicians I.  Hospitalizations, surgeries, and ER visits in previous 12 months J.  [place] such as hearing and vision if needed, cognitive and depression L. Referrals and appointments - none  In addition, I have reviewed and discussed with patient certain preventive protocols, quality metrics, and best practice recommendations. A written personalized care plan for preventive services as well as general preventive health recommendations were provided to patient.  See attached scanned  questionnaire for additional information.   Signed,  Fabio Neighbors, LPN Nurse Health Advisor   Nurse Recommendations: Please see encounter sent today regarding pt questions.   Reviewed Nurse Health Advisor's documentation and recommendations. Was available for consultation during screening. Patient decided to schedule a follow up appointment next week to discuss findings and immunizations.
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
## 2018-02-09 NOTE — Patient Instructions (Signed)
[name] , Thank you for taking time to come for your Medicare Wellness Visit. I appreciate your ongoing commitment to your health goals. Please review the following plan we discussed and let me know if I can assist you in the future.   Screening recommendations/referrals: Colonoscopy: No longer required.  Mammogram: No longer required.  Bone Density: No longer required.  Recommended yearly ophthalmology/optometry visit for glaucoma screening and checkup Recommended yearly dental visit for hygiene and checkup  Vaccinations: Influenza vaccine: Up to date Pneumococcal vaccine: Completed series Tdap vaccine: Up to date, due 02/2025 Shingles vaccine: Pt declines today.     Advanced directives: Please bring a copy of your POA (Power of Attorney) and/or Living Will to your next appointment.   Conditions/risks identified: Recommend to drink at least 6-8 8oz glasses of water per day.  Next appointment: None. Pt declined scheduling and AWV and CPE today. Encounter placed to PCP to see about driving. Will schedule following PCP's decision.    Preventive Care 40 Years and Older, Female Preventive care refers to lifestyle choices and visits with your health care provider that can promote health and wellness. What does preventive care include?  A yearly physical exam. This is also called an annual well check.  Dental exams once or twice a year.  Routine eye exams. Ask your health care provider how often you should have your eyes checked.  Personal lifestyle choices, including:  Daily care of your teeth and gums.  Regular physical activity.  Eating a healthy diet.  Avoiding tobacco and drug use.  Limiting alcohol use.  Practicing safe sex.  Taking low-dose aspirin every day.  Taking vitamin and mineral supplements as recommended by your health care provider. What happens during an annual well check? The services and screenings done by your health care provider during your  annual well check will depend on your age, overall health, lifestyle risk factors, and family history of disease. Counseling  Your health care provider may ask you questions about your:  Alcohol use.  Tobacco use.  Drug use.  Emotional well-being.  Home and relationship well-being.  Sexual activity.  Eating habits.  History of falls.  Memory and ability to understand (cognition).  Work and work Statistician.  Reproductive health. Screening  You may have the following tests or measurements:  Height, weight, and BMI.  Blood pressure.  Lipid and cholesterol levels. These may be checked every 5 years, or more frequently if you are over 11 years old.  Skin check.  Lung cancer screening. You may have this screening every year starting at age 46 if you have a 30-pack-year history of smoking and currently smoke or have quit within the past 15 years.  Fecal occult blood test (FOBT) of the stool. You may have this test every year starting at age 59.  Flexible sigmoidoscopy or colonoscopy. You may have a sigmoidoscopy every 5 years or a colonoscopy every 10 years starting at age 77.  Hepatitis C blood test.  Hepatitis B blood test.  Sexually transmitted disease (STD) testing.  Diabetes screening. This is done by checking your blood sugar (glucose) after you have not eaten for a while (fasting). You may have this done every 1-3 years.  Bone density scan. This is done to screen for osteoporosis. You may have this done starting at age 46.  Mammogram. This may be done every 1-2 years. Talk to your health care provider about how often you should have regular mammograms. Talk with your health care provider  about your test results, treatment options, and if necessary, the need for more tests. Vaccines  Your health care provider may recommend certain vaccines, such as:  Influenza vaccine. This is recommended every year.  Tetanus, diphtheria, and acellular pertussis (Tdap, Td)  vaccine. You may need a Td booster every 10 years.  Zoster vaccine. You may need this after age 46.  Pneumococcal 13-valent conjugate (PCV13) vaccine. One dose is recommended after age 53.  Pneumococcal polysaccharide (PPSV23) vaccine. One dose is recommended after age 65. Talk to your health care provider about which screenings and vaccines you need and how often you need them. This information is not intended to replace advice given to you by your health care provider. Make sure you discuss any questions you have with your health care provider. Document Released: 03/23/2015 Document Revised: 11/14/2015 Document Reviewed: 12/26/2014 Elsevier Interactive Patient Education  2017 [place] Prevention in the Home Falls can cause injuries. They can happen to people of all ages. There are many things you can do to make your home safe and to help prevent falls. What can I do on the outside of my home?  Regularly fix the edges of walkways and driveways and fix any cracks.  Remove anything that might make you trip as you walk through a door, such as a raised step or threshold.  Trim any bushes or trees on the path to your home.  Use bright outdoor lighting.  Clear any walking paths of anything that might make someone trip, such as rocks or tools.  Regularly check to see if handrails are loose or broken. Make sure that both sides of any steps have handrails.  Any raised decks and porches should have guardrails on the edges.  Have any leaves, snow, or ice cleared regularly.  Use sand or salt on walking paths during winter.  Clean up any spills in your garage right away. This includes oil or grease spills. What can I do in the bathroom?  Use night lights.  Install grab bars by the toilet and in the tub and shower. Do not use towel bars as grab bars.  Use non-skid mats or decals in the tub or shower.  If you need to sit down in the shower, use a plastic, non-slip  stool.  Keep the floor dry. Clean up any water that spills on the floor as soon as it happens.  Remove soap buildup in the tub or shower regularly.  Attach bath mats securely with double-sided non-slip rug tape.  Do not have throw rugs and other things on the floor that can make you trip. What can I do in the bedroom?  Use night lights.  Make sure that you have a light by your bed that is easy to reach.  Do not use any sheets or blankets that are too big for your bed. They should not hang down onto the floor.  Have a firm chair that has side arms. You can use this for support while you get dressed.  Do not have throw rugs and other things on the floor that can make you trip. What can I do in the kitchen?  Clean up any spills right away.  Avoid walking on wet floors.  Keep items that you use a lot in easy-to-reach places.  If you need to reach something above you, use a strong step stool that has a grab bar.  Keep electrical cords out of the way.  Do not use floor polish or  wax that makes floors slippery. If you must use wax, use non-skid floor wax.  Do not have throw rugs and other things on the floor that can make you trip. What can I do with my stairs?  Do not leave any items on the stairs.  Make sure that there are handrails on both sides of the stairs and use them. Fix handrails that are broken or loose. Make sure that handrails are as long as the stairways.  Check any carpeting to make sure that it is firmly attached to the stairs. Fix any carpet that is loose or worn.  Avoid having throw rugs at the top or bottom of the stairs. If you do have throw rugs, attach them to the floor with carpet tape.  Make sure that you have a light switch at the top of the stairs and the bottom of the stairs. If you do not have them, ask someone to add them for you. What else can I do to help prevent falls?  Wear shoes that:  Do not have high heels.  Have rubber bottoms.  Are  comfortable and fit you well.  Are closed at the toe. Do not wear sandals.  If you use a stepladder:  Make sure that it is fully opened. Do not climb a closed stepladder.  Make sure that both sides of the stepladder are locked into place.  Ask someone to hold it for you, if possible.  Clearly mark and make sure that you can see:  Any grab bars or handrails.  First and last steps.  Where the edge of each step is.  Use tools that help you move around (mobility aids) if they are needed. These include:  Canes.  Walkers.  Scooters.  Crutches.  Turn on the lights when you go into a dark area. Replace any light bulbs as soon as they burn out.  Set up your furniture so you have a clear path. Avoid moving your furniture around.  If any of your floors are uneven, fix them.  If there are any pets around you, be aware of where they are.  Review your medicines with your doctor. Some medicines can make you feel dizzy. This can increase your chance of falling. Ask your doctor what other things that you can do to help prevent falls. This information is not intended to replace advice given to you by your health care provider. Make sure you discuss any questions you have with your health care provider. Document Released: 12/21/2008 Document Revised: 08/02/2015 Document Reviewed: 03/31/2014 Elsevier Interactive Patient Education  2017 [employer].

## 2018-02-10 NOTE — Telephone Encounter (Signed)
Noted, thank you

## 2018-02-15 ENCOUNTER — Other Ambulatory Visit: Payer: Self-pay | Admitting: Family Medicine

## 2018-02-15 DIAGNOSIS — I10 Essential (primary) hypertension: Secondary | ICD-10-CM
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
## 2018-02-16 ENCOUNTER — Encounter: Payer: Self-pay | Admitting: Family Medicine

## 2018-02-16 ENCOUNTER — Ambulatory Visit: Payer: Medicare Other | Admitting: Family Medicine

## 2018-02-16 DIAGNOSIS — E1100 Type 2 diabetes mellitus with hyperosmolarity without nonketotic hyperglycemic-hyperosmolar coma (NKHHC): Secondary | ICD-10-CM

## 2018-02-16 DIAGNOSIS — I10 Essential (primary) hypertension: Secondary | ICD-10-CM | POA: Diagnosis not present

## 2018-02-16 DIAGNOSIS — E782 Mixed hyperlipidemia: Secondary | ICD-10-CM

## 2018-03-19 NOTE — Telephone Encounter (Signed)
Completed AWV 02/09/18. -MM

## 2018-03-30 ENCOUNTER — Telehealth: Payer: Self-pay

## 2018-03-30 LAB — COMPREHENSIVE METABOLIC PANEL
ALT: 20 IU/L (ref 0–32)
AST: 17 IU/L (ref 0–40)
Albumin/Globulin Ratio: 1.7 (ref 1.2–2.2)
Albumin: 4 g/dL (ref 3.6–4.6)
Alkaline Phosphatase: 59 IU/L (ref 39–117)
BUN/Creatinine Ratio: 19 (ref 12–28)
BUN: 19 mg/dL (ref 8–27)
Bilirubin Total: 0.6 mg/dL (ref 0.0–1.2)
CO2: 25 mmol/L (ref 20–29)
Calcium: 9.6 mg/dL (ref 8.7–10.3)
Chloride: 103 mmol/L (ref 96–106)
Creatinine, Ser: 1.02 mg/dL — ABNORMAL HIGH (ref 0.57–1.00)
GFR calc Af Amer: 58 mL/min/{1.73_m2} — ABNORMAL LOW (ref >59)
GFR calc non Af Amer: 51 mL/min/{1.73_m2} — ABNORMAL LOW (ref >59)
GLUCOSE: 120 mg/dL — AB (ref 65–99)
Globulin, Total: 2.4 g/dL (ref 1.5–4.5)
Potassium: 4.1 mmol/L (ref 3.5–5.2)
Sodium: 143 mmol/L (ref 134–144)
Total Protein: 6.4 g/dL (ref 6.0–8.5)

## 2018-03-30 LAB — HEMOGLOBIN A1C
Est. average glucose Bld gHb Est-mCnc: 148 mg/dL
Hgb A1c MFr Bld: 6.8 % — ABNORMAL HIGH (ref 4.8–5.6)

## 2018-03-30 LAB — LIPID PANEL
CHOL/HDL RATIO: 2.8 ratio (ref 0.0–4.4)
Cholesterol, Total: 148 mg/dL (ref 100–199)
HDL: 52 mg/dL (ref >39)
LDL Calculated: 64 mg/dL (ref 0–99)
TRIGLYCERIDES: 160 mg/dL — AB (ref 0–149)
VLDL Cholesterol Cal: 32 mg/dL (ref 5–40)

## 2018-03-30 LAB — CBC WITH DIFFERENTIAL/PLATELET
Basophils Absolute: 0 10*3/uL (ref 0.0–0.2)
Basos: 1 %
EOS (ABSOLUTE): 0.1 10*3/uL (ref 0.0–0.4)
Eos: 2 %
Hematocrit: 37.3 % (ref 34.0–46.6)
Hemoglobin: 12.9 g/dL (ref 11.1–15.9)
Immature Grans (Abs): 0 10*3/uL (ref 0.0–0.1)
Immature Granulocytes: 0 %
LYMPHS: 44 %
Lymphocytes Absolute: 3.1 10*3/uL (ref 0.7–3.1)
MCH: 31.7 pg (ref 26.6–33.0)
MCHC: 34.6 g/dL (ref 31.5–35.7)
MCV: 92 fL (ref 79–97)
Monocytes Absolute: 0.8 10*3/uL (ref 0.1–0.9)
Monocytes: 12 %
Neutrophils Absolute: 2.8 10*3/uL (ref 1.4–7.0)
Neutrophils: 41 %
Platelets: 238 10*3/uL (ref 150–450)
RBC: 4.07 x10E6/uL (ref 3.77–5.28)
RDW: 12.2 % (ref 11.7–15.4)
WBC: 7 10*3/uL (ref 3.4–10.8)

## 2018-03-30 NOTE — Telephone Encounter (Signed)
-----   Message from [employer], [place] sent at 03/30/2018  8:36 AM EST ----- Hgb A1C stable and blood sugar better control. Kidney function showing improvement but triglycerides above the goal of <150. Remainder of labs essentially normal. Continue present medications and recheck in 3 months.

## 2018-03-30 NOTE — Telephone Encounter (Signed)
Patient advised as directed below. Patient is scheduled for her 3 month follow-up.

## 2018-04-06 ENCOUNTER — Telehealth: Payer: Self-pay | Admitting: Internal Medicine

## 2018-04-06 NOTE — Telephone Encounter (Signed)
Patient called back. Scheduled her to see Dr End in April. She denies any current cardiac symptoms. Needs to know how long to hold Eliquis prior to tooth extraction. Routing to pharmacy.

## 2018-04-06 NOTE — Telephone Encounter (Signed)
Recommend continuing Eliquis for single dental extraction.

## 2018-04-06 NOTE — Telephone Encounter (Signed)
1. What dental office are you calling from? [facility]   2. What is your office phone number? [phone]   3. What is your fax number? [phone]  4. What type of procedure is the patient having performed? Tooth extraction   5. What date is procedure scheduled or is the patient there now?  TBD (if the patient is at the dentist's office question goes to their cardiologist if he/she is in the office.  If not, question should go to the DOD).   6. What is your question (ex. Antibiotics prior to procedure, holding medication-we need to know how long dentist wants pt to hold med)? Eliquis - when prior to stop and when to resume   [facility]

## 2018-04-06 NOTE — Telephone Encounter (Signed)
Routed to number provided via EPIC fax. Called patient and she verbalized understanding of recommendations as well.

## 2018-04-06 NOTE — Telephone Encounter (Signed)
No answer. Left message to call back on home number.  Called mobile number listed and it was her daughter. She was unsure of the details of procedure. I let her know I saw she was due for follow up and she needed an appointment as well. She will pass the message on to the patient.

## 2018-04-26 ENCOUNTER — Other Ambulatory Visit: Payer: Self-pay

## 2018-04-26 ENCOUNTER — Encounter: Payer: Self-pay | Admitting: Emergency Medicine

## 2018-04-26 DIAGNOSIS — Z87891 Personal history of nicotine dependence: Secondary | ICD-10-CM | POA: Diagnosis not present

## 2018-04-26 DIAGNOSIS — Z79899 Other long term (current) drug therapy: Secondary | ICD-10-CM | POA: Insufficient documentation

## 2018-04-26 DIAGNOSIS — R82998 Other abnormal findings in urine: Secondary | ICD-10-CM | POA: Insufficient documentation

## 2018-04-26 DIAGNOSIS — R531 Weakness: Secondary | ICD-10-CM | POA: Insufficient documentation

## 2018-04-26 DIAGNOSIS — Z85828 Personal history of other malignant neoplasm of skin: Secondary | ICD-10-CM | POA: Diagnosis not present

## 2018-04-26 DIAGNOSIS — Z7984 Long term (current) use of oral hypoglycemic drugs: Secondary | ICD-10-CM | POA: Diagnosis not present

## 2018-04-26 DIAGNOSIS — E119 Type 2 diabetes mellitus without complications: Secondary | ICD-10-CM | POA: Diagnosis not present

## 2018-04-26 DIAGNOSIS — I10 Essential (primary) hypertension: Secondary | ICD-10-CM | POA: Diagnosis not present

## 2018-04-26 DIAGNOSIS — Z7901 Long term (current) use of anticoagulants: Secondary | ICD-10-CM | POA: Insufficient documentation

## 2018-04-26 LAB — COMPREHENSIVE METABOLIC PANEL
ALT: 27 U/L (ref 0–44)
AST: 22 U/L (ref 15–41)
Albumin: 4 g/dL (ref 3.5–5.0)
Alkaline Phosphatase: 55 U/L (ref 38–126)
Anion gap: 7 (ref 5–15)
BUN: 20 mg/dL (ref 8–23)
CO2: 26 mmol/L (ref 22–32)
Calcium: 9.3 mg/dL (ref 8.9–10.3)
Chloride: 105 mmol/L (ref 98–111)
Creatinine, Ser: 0.78 mg/dL (ref 0.44–1.00)
GFR calc Af Amer: >60 mL/min (ref >60)
GFR calc non Af Amer: >60 mL/min (ref >60)
Glucose, Bld: 192 mg/dL — ABNORMAL HIGH (ref 70–99)
Potassium: 4 mmol/L (ref 3.5–5.1)
Sodium: 138 mmol/L (ref 135–145)
Total Bilirubin: 1 mg/dL (ref 0.3–1.2)
Total Protein: 7.1 g/dL (ref 6.5–8.1)

## 2018-04-26 LAB — CBC WITH DIFFERENTIAL/PLATELET
Abs Immature Granulocytes: 0.1 10*3/uL — ABNORMAL HIGH (ref 0.00–0.07)
Basophils Absolute: 0 10*3/uL (ref 0.0–0.1)
Basophils Relative: 0 %
EOS ABS: 0 10*3/uL (ref 0.0–0.5)
EOS PCT: 0 %
HCT: 40.7 % (ref 36.0–46.0)
Hemoglobin: 13.5 g/dL (ref 12.0–15.0)
Immature Granulocytes: 1 %
Lymphocytes Relative: 14 %
Lymphs Abs: 1.9 10*3/uL (ref 0.7–4.0)
MCH: 30 pg (ref 26.0–34.0)
MCHC: 33.2 g/dL (ref 30.0–36.0)
MCV: 90.4 fL (ref 80.0–100.0)
MONO ABS: 1.4 10*3/uL — AB (ref 0.1–1.0)
Monocytes Relative: 11 %
Neutro Abs: 9.8 10*3/uL — ABNORMAL HIGH (ref 1.7–7.7)
Neutrophils Relative %: 74 %
Platelets: 250 10*3/uL (ref 150–400)
RBC: 4.5 MIL/uL (ref 3.87–5.11)
RDW: 12.3 % (ref 11.5–15.5)
WBC: 13.4 10*3/uL — ABNORMAL HIGH (ref 4.0–10.5)
nRBC: 0 % (ref 0.0–0.2)

## 2018-04-26 NOTE — ED Triage Notes (Signed)
Pt presents to ED via [facility] with c/o foul smelling urine. EMS initially called out for sick call and "not feeling well". Per EMS pt called daughter to come stay with her, would like her evaluated for dehydration and UTI, pt states "I haven't felt well for 2 or 3 days." Pt is alert and oriented x 4 upon arrival to ED.    BP 201/97  CBG 190 HR 84-102 A-fib 98% on RA 98.7 [place]

## 2018-04-27 ENCOUNTER — Emergency Department
Admission: EM | Admit: 2018-04-27 | Discharge: 2018-04-27 | Disposition: A | Discharge status: Home / Self Care | Payer: Medicare Other | Attending: Emergency Medicine | Admitting: Emergency Medicine

## 2018-04-27 ENCOUNTER — Emergency Department: Payer: Medicare Other

## 2018-04-27 DIAGNOSIS — N390 Urinary tract infection, site not specified: Secondary | ICD-10-CM

## 2018-04-27 DIAGNOSIS — E860 Dehydration: Secondary | ICD-10-CM

## 2018-04-27 DIAGNOSIS — R531 Weakness: Secondary | ICD-10-CM

## 2018-04-27 LAB — LIPASE, BLOOD: Lipase: 23 U/L (ref 11–51)

## 2018-04-27 LAB — URINALYSIS, COMPLETE (UACMP) WITH MICROSCOPIC
Bilirubin Urine: NEGATIVE
Glucose, UA: NEGATIVE mg/dL
HGB URINE DIPSTICK: NEGATIVE
Ketones, ur: 20 mg/dL — AB
NITRITE: NEGATIVE
PH: 5 (ref 5.0–8.0)
Protein, ur: 30 mg/dL — AB
Specific Gravity, Urine: 1.023 (ref 1.005–1.030)
WBC, UA: >50 WBC/hpf — ABNORMAL HIGH (ref 0–5)

## 2018-04-27 LAB — INFLUENZA PANEL BY PCR (TYPE A & B)
Influenza A By PCR: NEGATIVE
Influenza B By PCR: NEGATIVE

## 2018-04-27 LAB — LACTIC ACID, PLASMA: Lactic Acid, Venous: 1.1 mmol/L (ref 0.5–1.9)

## 2018-04-27 LAB — TROPONIN I: Troponin I: <0.03 ng/mL (ref <0.03)

## 2018-04-27 MED ORDER — SODIUM CHLORIDE 0.9 % IV BOLUS
500.0000 mL | Freq: Once | INTRAVENOUS | Status: AC
  Administered 2018-04-27: 500 mL via INTRAVENOUS

## 2018-04-27 MED ORDER — CEPHALEXIN 250 MG PO CAPS
250.0000 mg | ORAL_CAPSULE | Freq: Once | ORAL | Status: AC
  Administered 2018-04-27: 250 mg via ORAL
  Filled 2018-04-27: qty 1

## 2018-04-27 MED ORDER — CEPHALEXIN 250 MG PO CAPS: 250.0000 mg | ORAL_CAPSULE | Freq: Three times a day (TID) | ORAL | 0 refills | Status: DC
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
## 2018-04-27 NOTE — Discharge Instructions (Addendum)
1.  Take antibiotic as prescribed (Keflex 3 times daily x7 days). 2.  Drink plenty of fluids daily. 3.  Return to the ER for worsening symptoms, persistent vomiting, difficulty breathing or other concerns.

## 2018-04-29 ENCOUNTER — Other Ambulatory Visit: Payer: Self-pay | Admitting: Family Medicine

## 2018-04-29 LAB — URINE CULTURE: Culture: >=100000 — AB

## 2018-04-30 NOTE — Progress Notes (Signed)
[name]    DOB: 1931/10/20   85 y.o.   MRN: [id number] Visit Date: 05/03/2018  Today's Provider: [name], PA   Chief Complaint  Patient presents with  . Hospitalization Follow-up   Subjective:     HPI    Follow up ER visit  Patient was seen in ER for [facility] on 04/27/2018. She was treated for Afib and Bladder Infection. Treatment for this included; obtained ekg, chest x-ray, UA, labs. Given rx for Keflex. She reports goodcompliance with treatment. She reports this condition is Improved.  ---------------------------------------------------------------  Patient states she feels improved somewhat since ER visit but she is still very fatigued.   Past Medical History:  Diagnosis Date  . Chronic diarrhea   . Collagenous colitis   . Diabetes mellitus without complication ([place])   . Diverticulitis   . History of echocardiogram    a. 11/2015: echo showing EF of 60-65% with no WMA. Grade 1 DD and mild MR noted.   . Hypertension   . PAF (paroxysmal atrial fibrillation) ([place])    a. initially diagnosed in 08/2016 --> started on Eliquis  . Syncope    a. initially occurring in Fall 2016 b. Hospitalized in 10/2015 and 11/2015 for recurrent episodes.    Past Surgical History:  Procedure Laterality Date  . ABDOMINAL HYSTERECTOMY    . BACK SURGERY  08/08/2008   Lumbar discectomy  . NECK SURGERY  1980   Family History  Problem Relation Age of Onset  . Hypertension Mother   . Diabetes Mother   . Lung cancer Father    Allergies  Allergen Reactions  . Morphine Sulfate Other (See Comments)    BP bottoms out  . Penicillins Diarrhea    Has patient had a PCN reaction causing immediate rash, facial/tongue/throat swelling, SOB or lightheadedness with hypotension: Yes Has patient had a PCN reaction causing severe rash involving mucus membranes or skin necrosis: Yes Has patient had a PCN reaction that required hospitalization No Has patient had a  PCN reaction occurring within the last 10 years: No If all of the above answers are "NO", then may proceed with Cephalosporin use.    Current Outpatient Medications:  .  budesonide (ENTOCORT EC) 3 MG 24 hr capsule, Take 1 capsule (3 mg total) by mouth daily., Disp: 90 capsule, Rfl: 1 .  cephALEXin (KEFLEX) 250 MG capsule, Take 1 capsule (250 mg total) by mouth 3 (three) times daily., Disp: 21 capsule, Rfl: 0 .  Cholecalciferol (VITAMIN D) 2000 UNITS CAPS, Take 1 capsule by mouth daily. , Disp: , Rfl:  .  diphenhydramine-acetaminophen (TYLENOL PM) 25-500 MG TABS tablet, Take 1 tablet by mouth at bedtime as needed., Disp: , Rfl:  .  ELIQUIS 5 MG TABS tablet, TAKE ONE TABLET BY MOUTH TWICE A DAY, Disp: 116 tablet, Rfl: 1 .  furosemide (LASIX) 20 MG tablet, TAKE ONE TABLET BY MOUTH DAILY AS NEEDED, Disp: 90 tablet, Rfl: 0 .  glipiZIDE (GLUCOTROL XL) 5 MG 24 hr tablet, TAKE ONE TABLET BY MOUTH DAILY WITH BREAKFAST., Disp: 90 tablet, Rfl: 2 .  lisinopril (PRINIVIL,ZESTRIL) 5 MG tablet, TAKE ONE TABLET BY MOUTH DAILY, Disp: 90 tablet, Rfl: 3 .  metFORMIN (GLUCOPHAGE) 500 MG tablet, Take 1 tablet (500 mg total) by mouth 2 (two) times daily with a meal. TAKE ONE TABLET IN THE MORNING AND TWO TABLETS IN THE EVENING (Patient taking differently: Take 500 mg by mouth 2 (two) times daily with a  meal. ), Disp: 180 tablet, Rfl: 3 .  metoprolol succinate (TOPROL-XL) 25 MG 24 hr tablet, TAKE ONE TABLET BY MOUTH DAILY, Disp: 90 tablet, Rfl: 3 .  Multiple Vitamin tablet, Take 1 tablet by mouth every evening. Reported on 03/13/2015, Disp: , Rfl:  .  simvastatin (ZOCOR) 20 MG tablet, TAKE ONE TABLET BY MOUTH EVERY NIGHT AT BEDTIME, Disp: 90 tablet, Rfl: 3 .  traMADol (ULTRAM) 50 MG tablet, Take 1 tablet (50 mg total) by mouth every 6 (six) hours as needed., Disp: 20 tablet, Rfl: 0  Review of Systems  Constitutional: Negative for appetite change, chills, fatigue and fever.  Respiratory: Negative for chest tightness and  shortness of breath.   Cardiovascular: Negative for chest pain and palpitations.  Gastrointestinal: Negative for abdominal pain, nausea and vomiting.  Neurological: Negative for dizziness and weakness.   Social History   Tobacco Use  . Smoking status: Former Smoker    Last attempt to quit: 03/09/1978    Years since quitting: 40.1  . Smokeless tobacco: Never Used  Substance Use Topics  . Alcohol use: Yes    Alcohol/week: 0.0 - 1.0 standard drinks     Objective:   BP (!) 102/58 (BP Location: Right Arm, Patient Position: Sitting, Cuff Size: Large)   Pulse 84   Temp 97.9 F (36.6 C) (Oral)   Resp 16   Wt 165 lb (74.8 kg)   SpO2 98%   BMI 30.67 kg/m    Wt Readings from Last 3 Encounters:  05/03/18 165 lb (74.8 kg)  04/26/18 160 lb (72.6 kg)  02/16/18 163 lb (73.9 kg)   Vitals:   05/03/18 1010  BP: (!) 102/58  Pulse: 84  Resp: 16  Temp: 97.9 F (36.6 C)  TempSrc: Oral  SpO2: 98%  Weight: 165 lb (74.8 kg)   Physical Exam Constitutional:      General: She is not in acute distress.    Appearance: She is well-developed.  HENT:     Head: Normocephalic and atraumatic.     Right Ear: Hearing normal.     Left Ear: Hearing normal.     Nose: Nose normal.  Eyes:     General: Lids are normal. No scleral icterus.       Right eye: No discharge.        Left eye: No discharge.     Conjunctiva/sclera: Conjunctivae normal.  Cardiovascular:     Rate and Rhythm: Normal rate and regular rhythm.     Heart sounds: Normal heart sounds.  Pulmonary:     Effort: Pulmonary effort is normal. No respiratory distress.  Abdominal:     General: Bowel sounds are normal.  Musculoskeletal: Normal range of motion.  Skin:    Findings: No lesion or rash.  Neurological:     Mental Status: She is alert and oriented to person, place, and time.  Psychiatric:        Speech: Speech normal.        Behavior: Behavior normal.        Thought Content: Thought content normal.       Assessment &  Plan    1. Fatigue, unspecified type Felt generalized weakness on 04-27-18 and was taken to the ER by her daughter. Blood cultures were negative but urinalysis and urine culture confirmed UTI. CXR, flu test, lactic acid and troponin levels were all normal. She improved with IV hydration. Still a little fatigue now. Will recheck CBC and CMP. Recommend continued increase in fluid intake. Recheck  pending lab reports. - CBC with Differential/Platelet - Comprehensive metabolic panel  2. Urinary tract infection without hematuria, site unspecified Initial urinalysis in the ER on 04-27-18 showed pyuria and greater than 100,000 colonies of Aerococcus Urinae was isolated on the culture. CBC showed WBC count 13,400. Was discharged on Keflex 250 mg TID for a week and no dysuria or fever noted today. No hematuria or abdominal pains. Will recheck labs and finish all the antibiotics. Recheck pending lab reports. - CBC with Differential/Platelet - Comprehensive metabolic panel     [name], PA  [facility]

## 2018-05-02 LAB — CULTURE, BLOOD (ROUTINE X 2)
Culture: NO GROWTH
Culture: NO GROWTH
Special Requests: ADEQUATE

## 2018-05-03 ENCOUNTER — Encounter: Payer: Self-pay | Admitting: Family Medicine

## 2018-05-03 ENCOUNTER — Ambulatory Visit (INDEPENDENT_AMBULATORY_CARE_PROVIDER_SITE_OTHER): Payer: Medicare Other | Admitting: Family Medicine

## 2018-05-03 VITALS — BP 102/58 | HR 84 | Temp 97.9°F | Resp 16 | Wt 165.0 lb

## 2018-05-03 DIAGNOSIS — N390 Urinary tract infection, site not specified: Secondary | ICD-10-CM

## 2018-05-03 DIAGNOSIS — R5383 Other fatigue: Secondary | ICD-10-CM | POA: Diagnosis not present

## 2018-05-03 LAB — POCT URINALYSIS DIPSTICK
Appearance: NORMAL
Bilirubin, UA: NEGATIVE
Blood, UA: NEGATIVE
Glucose, UA: NEGATIVE
Ketones, UA: NEGATIVE
LEUKOCYTES UA: NEGATIVE
NITRITE UA: NEGATIVE
Odor: ABNORMAL
PROTEIN UA: NEGATIVE
Spec Grav, UA: 1.015 (ref 1.010–1.025)
Urobilinogen, UA: 0.2 E.U./dL
pH, UA: 6 (ref 5.0–8.0)

## 2018-05-04 ENCOUNTER — Telehealth: Payer: Self-pay | Admitting: *Deleted

## 2018-05-04 LAB — COMPREHENSIVE METABOLIC PANEL
ALT: 18 IU/L (ref 0–32)
AST: 14 IU/L (ref 0–40)
Albumin/Globulin Ratio: 1.7 (ref 1.2–2.2)
Albumin: 4.1 g/dL (ref 3.6–4.6)
Alkaline Phosphatase: 59 IU/L (ref 39–117)
BUN/Creatinine Ratio: 23 (ref 12–28)
BUN: 20 mg/dL (ref 8–27)
Bilirubin Total: 0.3 mg/dL (ref 0.0–1.2)
CO2: 24 mmol/L (ref 20–29)
Calcium: 9.8 mg/dL (ref 8.7–10.3)
Chloride: 102 mmol/L (ref 96–106)
Creatinine, Ser: 0.88 mg/dL (ref 0.57–1.00)
GFR calc non Af Amer: 61 mL/min/{1.73_m2} (ref >59)
GFR, EST AFRICAN AMERICAN: 70 mL/min/{1.73_m2} (ref >59)
Globulin, Total: 2.4 g/dL (ref 1.5–4.5)
Glucose: 112 mg/dL — ABNORMAL HIGH (ref 65–99)
Potassium: 4.8 mmol/L (ref 3.5–5.2)
Sodium: 144 mmol/L (ref 134–144)
Total Protein: 6.5 g/dL (ref 6.0–8.5)

## 2018-05-04 LAB — CBC WITH DIFFERENTIAL/PLATELET
BASOS: 1 %
Basophils Absolute: 0 10*3/uL (ref 0.0–0.2)
EOS (ABSOLUTE): 0.1 10*3/uL (ref 0.0–0.4)
Eos: 1 %
Hematocrit: 36.9 % (ref 34.0–46.6)
Hemoglobin: 12.7 g/dL (ref 11.1–15.9)
Immature Grans (Abs): 0.1 10*3/uL (ref 0.0–0.1)
Immature Granulocytes: 1 %
LYMPHS: 34 %
Lymphocytes Absolute: 2.6 10*3/uL (ref 0.7–3.1)
MCH: 30.7 pg (ref 26.6–33.0)
MCHC: 34.4 g/dL (ref 31.5–35.7)
MCV: 89 fL (ref 79–97)
Monocytes Absolute: 0.7 10*3/uL (ref 0.1–0.9)
Monocytes: 10 %
Neutrophils Absolute: 4.1 10*3/uL (ref 1.4–7.0)
Neutrophils: 53 %
Platelets: 310 10*3/uL (ref 150–450)
RBC: 4.14 x10E6/uL (ref 3.77–5.28)
RDW: 11.8 % (ref 11.7–15.4)
WBC: 7.7 10*3/uL (ref 3.4–10.8)

## 2018-05-04 NOTE — Telephone Encounter (Signed)
Pt returned missed call. Pt will be going out in an hour.  Please call pt back,  Thanks, [place]

## 2018-05-04 NOTE — Telephone Encounter (Signed)
Patient was notified of results. Expressed understanding.  

## 2018-05-04 NOTE — Telephone Encounter (Signed)
-----   Message from [place], [place] sent at 05/04/2018  9:08 AM EST ----- All blood tests are back to normal. Finish the antibiotic (Keflex) and drink plenty of fluids. Encourage to eat 3 meals a day, regularly. Blood sugar in very good shape, also. Keep follow up appointment scheduled for 07-01-18.

## 2018-05-04 NOTE — Telephone Encounter (Signed)
LMOVM for pt to return call 

## 2018-05-10 ENCOUNTER — Other Ambulatory Visit: Payer: Self-pay | Admitting: Internal Medicine

## 2018-05-11 NOTE — Telephone Encounter (Signed)
Refill Request.  

## 2018-05-19 ENCOUNTER — Other Ambulatory Visit: Payer: Self-pay

## 2018-05-19 ENCOUNTER — Encounter: Payer: Self-pay | Admitting: *Deleted

## 2018-05-20 ENCOUNTER — Encounter: Payer: Self-pay | Admitting: Anesthesiology

## 2018-05-20 NOTE — Discharge Instructions (Signed)

## 2018-05-26 ENCOUNTER — Ambulatory Visit: Admit: 2018-05-26 | Payer: Medicare Other | Admitting: Ophthalmology

## 2018-05-26 HISTORY — DX: Cardiac murmur, unspecified: R01.1

## 2018-05-26 SURGERY — PHACOEMULSIFICATION, CATARACT, WITH IOL INSERTION
Anesthesia: Topical | Laterality: Left

## 2018-06-11 ENCOUNTER — Telehealth: Payer: Self-pay | Admitting: *Deleted

## 2018-06-11 NOTE — Telephone Encounter (Signed)
Pt refused virtual video visit and mentioned that she is doing well and doesn't feel that her appointment is urgent at this time. She would like to be seen physically in office. Pt is aware that it may take several months before she can get in to see Dr. Saunders Revel.

## 2018-06-11 NOTE — Telephone Encounter (Signed)
Discussed with patient options for Evisit.  She decided she is ok with doing telephone only Visit .  Rescheduled appt as Virtual.

## 2018-06-11 NOTE — Telephone Encounter (Signed)
Virtual Visit Pre-Appointment Phone Call  Steps For Call:  1. Confirm consent - "In the setting of the current Covid19 crisis, you are scheduled for a (phone or video) visit with your provider on (date) at (time).  Just as we do with many in-office visits, in order for you to participate in this visit, we must obtain consent.  If you'd like, I can send this to your mychart (if signed up) or email for you to review.  Otherwise, I can obtain your verbal consent now.  All virtual visits are billed to your insurance company just like a normal visit would be.  By agreeing to a virtual visit, we'd like you to understand that the technology does not allow for your provider to perform an examination, and thus may limit your provider's ability to fully assess your condition.  Finally, though the technology is pretty good, we cannot assure that it will always work on either your or our end, and in the setting of a video visit, we may have to convert it to a phone-only visit.  In either situation, we cannot ensure that we have a secure connection.  Are you willing to proceed?"  2. Give patient instructions for WebEx download to smartphone as below if video visit  3. Advise patient to be prepared with any vital sign or heart rhythm information, their current medicines, and a piece of paper and pen handy for any instructions they may receive the day of their visit  4. Inform patient they will receive a phone call 15 minutes prior to their appointment time (may be from unknown caller ID) so they should be prepared to answer  5. Confirm that appointment type is correct in Epic appointment notes (video vs telephone)    TELEPHONE CALL NOTE  [name] has been deemed a candidate for a follow-up tele-health visit to limit community exposure during the Covid-19 pandemic. I spoke with the patient via phone to ensure availability of phone/video source, confirm preferred email & phone number, and discuss  instructions and expectations.  I reminded [name] to be prepared with any vital sign and/or heart rhythm information that could potentially be obtained via home monitoring, at the time of her visit. I reminded [name] to expect a phone call at the time of her visit if her visit.  Did the patient verbally acknowledge consent to treatment? YES  [name] 06/11/2018 2:56 PM   DOWNLOADING THE [place], go to [employer] and type in WebEx in the search bar. [place] [employer], the blue/green circle. The app is free but as with any other app downloads, their phone may require them to verify saved payment information or Apple password. The patient does NOT have to create an account.  - If Android, ask patient to go to [employer] and type in WebEx in the search bar. [place] [employer], the blue/green circle. The app is free but as with any other app downloads, their phone may require them to verify saved payment information or Android password. The patient does NOT have to create an account.   CONSENT FOR TELE-HEALTH VISIT - PLEASE REVIEW  I hereby voluntarily request, consent and authorize [facility] and its employed or contracted physicians, physician assistants, nurse practitioners or other licensed health care professionals (the Practitioner), to provide me with telemedicine health care services (the Services") as deemed necessary by the treating Practitioner. I  acknowledge and consent to receive the Services by the Practitioner via telemedicine. I understand that the telemedicine visit will involve communicating with the Practitioner through live audiovisual communication technology and the disclosure of certain medical information by electronic transmission. I acknowledge that I have been given the opportunity to request an in-person assessment or other available alternative prior to the telemedicine visit and  am voluntarily participating in the telemedicine visit.  I understand that I have the right to withhold or withdraw my consent to the use of telemedicine in the course of my care at any time, without affecting my right to future care or treatment, and that the Practitioner or I may terminate the telemedicine visit at any time. I understand that I have the right to inspect all information obtained and/or recorded in the course of the telemedicine visit and may receive copies of available information for a reasonable fee.  I understand that some of the potential risks of receiving the Services via telemedicine include:   Delay or interruption in medical evaluation due to technological equipment failure or disruption;  Information transmitted may not be sufficient (e.g. poor resolution of images) to allow for appropriate medical decision making by the Practitioner; and/or   In rare instances, security protocols could fail, causing a breach of personal health information.  Furthermore, I acknowledge that it is my responsibility to provide information about my medical history, conditions and care that is complete and accurate to the best of my ability. I acknowledge that Practitioner's advice, recommendations, and/or decision may be based on factors not within their control, such as incomplete or inaccurate data provided by me or distortions of diagnostic images or specimens that may result from electronic transmissions. I understand that the practice of medicine is not an exact science and that Practitioner makes no warranties or guarantees regarding treatment outcomes. I acknowledge that I will receive a copy of this consent concurrently upon execution via email to the email address I last provided but may also request a printed copy by calling the office of [facility].    I understand that my insurance will be billed for this visit.   I have read or had this consent read to me.  I understand the  contents of this consent, which adequately explains the benefits and risks of the Services being provided via telemedicine.   I have been provided ample opportunity to ask questions regarding this consent and the Services and have had my questions answered to my satisfaction.  I give my informed consent for the services to be provided through the use of telemedicine in my medical care  By participating in this telemedicine visit I agree to the above.

## 2018-06-11 NOTE — Telephone Encounter (Signed)
No answer. Left message to call back on patient's number.  Called the other number listed and it was her daughter, ok per DPR. We discussed importance and benefit of Virtual Visit.  Patient has not been seen since 04/2017. She said she would speak with her mother and have her call us to schedule if agreeable.

## 2018-06-17 NOTE — Telephone Encounter (Signed)
scheduled
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
## 2018-06-21 ENCOUNTER — Telehealth (INDEPENDENT_AMBULATORY_CARE_PROVIDER_SITE_OTHER): Payer: Medicare Other | Admitting: Internal Medicine

## 2018-06-21 ENCOUNTER — Ambulatory Visit: Payer: Medicare Other | Admitting: Internal Medicine

## 2018-06-21 ENCOUNTER — Other Ambulatory Visit: Payer: Self-pay

## 2018-06-21 ENCOUNTER — Encounter: Payer: Self-pay | Admitting: Internal Medicine

## 2018-06-21 VITALS — BP 142/66 | HR 72 | Ht 62.0 in | Wt 160.0 lb

## 2018-06-21 DIAGNOSIS — I10 Essential (primary) hypertension: Secondary | ICD-10-CM

## 2018-06-21 DIAGNOSIS — I480 Paroxysmal atrial fibrillation: Secondary | ICD-10-CM | POA: Diagnosis not present

## 2018-06-21 DIAGNOSIS — E782 Mixed hyperlipidemia: Secondary | ICD-10-CM | POA: Diagnosis not present

## 2018-06-21 NOTE — Patient Instructions (Signed)
Medication Instructions:  Your physician recommends that you continue on your current medications as directed. Please refer to the Current Medication list given to you today.  If you need a refill on your cardiac medications before your next appointment, please call your pharmacy.   Lab work: none If you have labs (blood work) drawn today and your tests are completely normal, you will receive your results only by: Marland Kitchen MyChart Message (if you have MyChart) OR . A paper copy in the mail If you have any lab test that is abnormal or we need to change your treatment, we will call you to review the results.  Testing/Procedures: none  Follow-Up: At [facility], you and your health needs are our priority.  As part of our continuing mission to provide you with exceptional heart care, we have created designated Provider Care Teams.  These Care Teams include your primary Cardiologist (physician) and Advanced Practice Providers (APPs -  Physician Assistants and Nurse Practitioners) who all work together to provide you with the care you need, when you need it. You will need a follow up appointment in 6 months.  Please call our office 2 months in advance to schedule this appointment.  You may see Nelva Bush, MD or one of the following Advanced Practice Providers on your designated Care Team:   Murray Hodgkins, NP Christell Faith, PA-C . Marrianne Mood, PA-C

## 2018-07-01 ENCOUNTER — Ambulatory Visit: Payer: Self-pay | Admitting: Family Medicine

## 2018-07-10 ENCOUNTER — Other Ambulatory Visit: Payer: Self-pay | Admitting: Internal Medicine

## 2018-07-10 ENCOUNTER — Other Ambulatory Visit: Payer: Self-pay | Admitting: Gastroenterology

## 2018-07-12 NOTE — Telephone Encounter (Signed)
Pt's wt 72.6 kg, age 85, serum creatinine 0.88, CrCl 53.57.

## 2018-07-12 NOTE — Telephone Encounter (Signed)
Please review for refill.  

## 2018-07-13 ENCOUNTER — Other Ambulatory Visit: Payer: Self-pay | Admitting: Gastroenterology

## 2018-08-13 ENCOUNTER — Other Ambulatory Visit: Payer: Self-pay | Admitting: Internal Medicine

## 2018-08-30 ENCOUNTER — Ambulatory Visit (INDEPENDENT_AMBULATORY_CARE_PROVIDER_SITE_OTHER): Payer: Medicare Other | Admitting: Family Medicine

## 2018-08-30 ENCOUNTER — Encounter: Payer: Self-pay | Admitting: Family Medicine

## 2018-08-30 ENCOUNTER — Other Ambulatory Visit: Payer: Self-pay

## 2018-08-30 VITALS — BP 120/60 | HR 46 | Temp 97.9°F | Resp 16 | Wt 168.0 lb

## 2018-08-30 DIAGNOSIS — I10 Essential (primary) hypertension: Secondary | ICD-10-CM

## 2018-08-30 DIAGNOSIS — E1100 Type 2 diabetes mellitus with hyperosmolarity without nonketotic hyperglycemic-hyperosmolar coma (NKHHC): Secondary | ICD-10-CM | POA: Diagnosis not present

## 2018-08-30 DIAGNOSIS — I480 Paroxysmal atrial fibrillation: Secondary | ICD-10-CM | POA: Diagnosis not present

## 2018-08-30 DIAGNOSIS — E7800 Pure hypercholesterolemia, unspecified: Secondary | ICD-10-CM

## 2018-08-30 LAB — POCT GLYCOSYLATED HEMOGLOBIN (HGB A1C)
Est. average glucose Bld gHb Est-mCnc: 163
Hemoglobin A1C: 7.3 % — AB (ref 4.0–5.6)

## 2018-08-30 NOTE — Progress Notes (Signed)
[name]    DOB: [birth year]   85 y.o.   MRN: [id number] Visit Date: 08/30/2018  Today's Provider: [name], PA   Chief Complaint  Patient presents with  . Follow-up  . Diabetes  . Hypertension  . Hyperlipidemia   Subjective:     HPI    Diabetes Mellitus Type II, Follow-up:   Lab Results  Component Value Date   HGBA1C 6.8 (H) 03/29/2018   HGBA1C 6.9 (H) 09/11/2017   HGBA1C 6.9 (H) 04/14/2017   Last seen for diabetes 6 months ago.  Management since then includes; labs checked 03/29/2018 showing A1c stable at 6.8. no changes were made. She reports good compliance with treatment. She is not having side effects. none Current symptoms include none and have been unchanged. Home blood sugar records: fasting range: 120-130  Episodes of hypoglycemia? no   Current Insulin Regimen: n/s Most Recent Eye Exam: 12/07/2017 Weight trend: stable Prior visit with dietician: no Current diet: not asked Current exercise: none  -------------------------------------------------------------------   Hypertension, follow-up:  BP Readings from Last 3 Encounters:  08/30/18 120/60  06/21/18 (!) 142/66  05/03/18 (!) 102/58    She was last seen for hypertension 6 months ago.  BP at that visit was 02/16/2018. Management since that visit includes; labs checked 03/29/2018, no changes.She reports good compliance with treatment. She is not having side effects. none She is not exercising. She is not adherent to low salt diet.   Outside blood pressures are none. She is experiencing none.  Patient denies none.   Cardiovascular risk factors include advanced age (older than 47 for men, 87 for women) and diabetes mellitus.  Use of agents associated with hypertension: none.   -------------------------------------------------------------------   Lipid/Cholesterol, Follow-up:   Last seen for this 6 months ago.  Management since that visit includes; labs  checked 03/29/2018, no changes.  Last Lipid Panel:    Component Value Date/Time   CHOL 148 03/29/2018 1023   TRIG 160 (H) 03/29/2018 1023   HDL 52 03/29/2018 1023   CHOLHDL 2.8 03/29/2018 1023   [place] 64 03/29/2018 1023    She reports good compliance with treatment. She is not having side effects. none  Wt Readings from Last 3 Encounters:  08/30/18 168 lb (76.2 kg)  06/21/18 160 lb (72.6 kg)  05/03/18 165 lb (74.8 kg)    ------------------------------------------------------------------------  Past Medical History:  Diagnosis Date  . Chronic diarrhea   . Collagenous colitis   . Diabetes mellitus without complication ([place])   . Diverticulitis   . Heart murmur    at birth, none since  . History of echocardiogram    a. 11/2015: echo showing EF of 60-65% with no WMA. Grade 1 DD and mild MR noted.   . Hypertension   . PAF (paroxysmal atrial fibrillation) ([place])    a. initially diagnosed in 08/2016 --> started on Eliquis  . Syncope    a. initially occurring in Fall 2016 b. Hospitalized in 10/2015 and 11/2015 for recurrent episodes.    Past Surgical History:  Procedure Laterality Date  . ABDOMINAL HYSTERECTOMY    . BACK SURGERY  08/08/2008   Lumbar discectomy  . NECK SURGERY  1980   Family History  Problem Relation Age of Onset  . Hypertension Mother   . Diabetes Mother   . Lung cancer Father    Allergies  Allergen Reactions  . Morphine Sulfate Other (See Comments)    BP bottoms out  .  Penicillins Diarrhea    Has patient had a PCN reaction causing immediate rash, facial/tongue/throat swelling, SOB or lightheadedness with hypotension: Yes Has patient had a PCN reaction causing severe rash involving mucus membranes or skin necrosis: Yes Has patient had a PCN reaction that required hospitalization No Has patient had a PCN reaction occurring within the last 10 years: No If all of the above answers are "NO", then may proceed with Cephalosporin use.    Current  Outpatient Medications:  .  apixaban (ELIQUIS) 5 MG TABS tablet, Take 1 tablet (5 mg total) by mouth 2 (two) times daily., Disp: 180 tablet, Rfl: 1 .  Cholecalciferol (VITAMIN D) 2000 UNITS CAPS, Take 1 capsule by mouth daily. , Disp: , Rfl:  .  diphenhydramine-acetaminophen (TYLENOL PM) 25-500 MG TABS tablet, Take 1 tablet by mouth at bedtime as needed., Disp: , Rfl:  .  furosemide (LASIX) 20 MG tablet, TAKE ONE TABLET BY MOUTH DAILY AS NEEDED, Disp: 90 tablet, Rfl: 0 .  glipiZIDE (GLUCOTROL XL) 5 MG 24 hr tablet, TAKE ONE TABLET BY MOUTH DAILY WITH BREAKFAST., Disp: 90 tablet, Rfl: 2 .  lisinopril (PRINIVIL,ZESTRIL) 5 MG tablet, TAKE ONE TABLET BY MOUTH DAILY, Disp: 90 tablet, Rfl: 3 .  metFORMIN (GLUCOPHAGE) 500 MG tablet, Take 1 tablet (500 mg total) by mouth 2 (two) times daily with a meal. TAKE ONE TABLET IN THE MORNING AND TWO TABLETS IN THE EVENING (Patient taking differently: Take 500 mg by mouth 2 (two) times daily with a meal. ), Disp: 180 tablet, Rfl: 3 .  metoprolol succinate (TOPROL-XL) 25 MG 24 hr tablet, TAKE ONE TABLET BY MOUTH DAILY, Disp: 30 tablet, Rfl: 3 .  Multiple Vitamin tablet, Take 1 tablet by mouth every evening. Reported on 03/13/2015, Disp: , Rfl:  .  simvastatin (ZOCOR) 20 MG tablet, TAKE ONE TABLET BY MOUTH EVERY NIGHT AT BEDTIME, Disp: 90 tablet, Rfl: 3 .  budesonide (ENTOCORT EC) 3 MG 24 hr capsule, Take 1 capsule (3 mg total) by mouth daily. (Patient not taking: Reported on 08/30/2018), Disp: 90 capsule, Rfl: 1  Review of Systems  Constitutional: Negative for appetite change, chills, fatigue and fever.  Respiratory: Negative for chest tightness and shortness of breath.   Cardiovascular: Negative for chest pain and palpitations.  Gastrointestinal: Negative for abdominal pain, nausea and vomiting.  Neurological: Negative for dizziness and weakness.   Social History   Tobacco Use  . Smoking status: Former Smoker    Quit date: 03/09/1978    Years since quitting:  40.5  . Smokeless tobacco: Never Used  Substance Use Topics  . Alcohol use: Yes    Alcohol/week: 0.0 - 1.0 standard drinks     Objective:   BP 120/60 (BP Location: Right Arm, Patient Position: Sitting, Cuff Size: Large)   Pulse (!) 46   Temp 97.9 F (36.6 C) (Oral)   Resp 16   Wt 168 lb (76.2 kg)   SpO2 94%   BMI 30.73 kg/m  Vitals:   08/30/18 1356  BP: 120/60  Pulse: (!) 46  Resp: 16  Temp: 97.9 F (36.6 C)  TempSrc: Oral  SpO2: 94%  Weight: 168 lb (76.2 kg)   Physical Exam  Constitutional: She is oriented to person, place, and time. She appears well-developed and well-nourished. No distress.  HENT:  Head: Normocephalic and atraumatic.  Right Ear: Hearing normal.  Left Ear: Hearing normal.  Nose: Nose normal.  Eyes: Conjunctivae and lids are normal. Right eye exhibits no discharge. Left eye exhibits  no discharge. No scleral icterus.  Neck: Neck supple.  Cardiovascular: Normal rate and regular rhythm.  Pulmonary/Chest: Effort normal and breath sounds normal. No respiratory distress.  Abdominal: Soft. Bowel sounds are normal.  Musculoskeletal: Normal range of motion.     Comments: Many varicose veins of both lower legs and feet. Good pulses. Using a cane for balance and support with walking. Fair ROM.  Neurological: She is alert and oriented to person, place, and time.  Skin: Skin is intact. No lesion and no rash noted.  Psychiatric: She has a normal mood and affect. Her speech is normal and behavior is normal. Thought content normal.       Assessment & Plan    1. Type 2 diabetes mellitus with hyperosmolarity without coma, without long-term current use of insulin (HCC) FBS in the 120-130 range at home. Tolerating Metformin 500 mg BID and Glipizide 5 mg qd. Still taking statin and ACE-I daily. Last foot exam by podiatrist (Dr. Elvina Mattes) on 08-11-18. Encouraged to see ophthalmologist annually. Hgb A1C 7.3% today. Recommend CBC, CMP and Lipid Panel fasting and continue  present medications. - POCT glycosylated hemoglobin (Hb A1C) - CBC with Differential/Platelet - Comprehensive metabolic panel - Lipid panel  2. Hypercholesteremia Tolerating Simvastatin 20 mg qd without side effects. Continue low fat diet restrictions and recheck follow up labs. - Comprehensive metabolic panel - Lipid panel - TSH  3. Essential hypertension Well controlled BP today. No chest pains, palpitations, edema of significance or dyspnea. Recheck routine labs and continue Lisinopril 5 mg qd, Metoprolol Succinate 25 mg qd with prn use of Furosemide 20 mg qd for lower extremity swelling. - CBC with Differential/Platelet - Comprehensive metabolic panel - Lipid panel - TSH  4. Paroxysmal atrial fibrillation (HCC) Followed by Dr. Saunders Revel (cardiologist). Rhythm seems to be well controlled by Metoprolol Succinate 25 mg qd and takes Eliquis 5 mg BID to prevent clot formation. - Comprehensive metabolic panel - TSH     [name], PA  [facility]

## 2018-09-07 LAB — CBC WITH DIFFERENTIAL/PLATELET
Basophils Absolute: 0 10*3/uL (ref 0.0–0.2)
Basos: 1 %
EOS (ABSOLUTE): 0.2 10*3/uL (ref 0.0–0.4)
Eos: 3 %
Hematocrit: 39.2 % (ref 34.0–46.6)
Hemoglobin: 12.7 g/dL (ref 11.1–15.9)
Immature Grans (Abs): 0 10*3/uL (ref 0.0–0.1)
Immature Granulocytes: 1 %
Lymphocytes Absolute: 3 10*3/uL (ref 0.7–3.1)
Lymphs: 49 %
MCH: 30 pg (ref 26.6–33.0)
MCHC: 32.4 g/dL (ref 31.5–35.7)
MCV: 93 fL (ref 79–97)
Monocytes Absolute: 0.8 10*3/uL (ref 0.1–0.9)
Monocytes: 12 %
Neutrophils Absolute: 2.1 10*3/uL (ref 1.4–7.0)
Neutrophils: 34 %
Platelets: 256 10*3/uL (ref 150–450)
RBC: 4.24 x10E6/uL (ref 3.77–5.28)
RDW: 12.6 % (ref 11.7–15.4)
WBC: 6.1 10*3/uL (ref 3.4–10.8)

## 2018-09-07 LAB — COMPREHENSIVE METABOLIC PANEL
ALT: 29 IU/L (ref 0–32)
AST: 21 IU/L (ref 0–40)
Albumin/Globulin Ratio: 1.9 (ref 1.2–2.2)
Albumin: 3.9 g/dL (ref 3.6–4.6)
Alkaline Phosphatase: 57 IU/L (ref 39–117)
BUN/Creatinine Ratio: 21 (ref 12–28)
BUN: 19 mg/dL (ref 8–27)
Bilirubin Total: 0.5 mg/dL (ref 0.0–1.2)
CO2: 24 mmol/L (ref 20–29)
Calcium: 9.2 mg/dL (ref 8.7–10.3)
Chloride: 104 mmol/L (ref 96–106)
Creatinine, Ser: 0.9 mg/dL (ref 0.57–1.00)
GFR calc Af Amer: 67 mL/min/{1.73_m2} (ref >59)
GFR calc non Af Amer: 58 mL/min/{1.73_m2} — ABNORMAL LOW (ref >59)
Globulin, Total: 2.1 g/dL (ref 1.5–4.5)
Glucose: 129 mg/dL — ABNORMAL HIGH (ref 65–99)
Potassium: 4.4 mmol/L (ref 3.5–5.2)
Sodium: 143 mmol/L (ref 134–144)
Total Protein: 6 g/dL (ref 6.0–8.5)

## 2018-09-07 LAB — LIPID PANEL
Chol/HDL Ratio: 2.9 ratio (ref 0.0–4.4)
Cholesterol, Total: 139 mg/dL (ref 100–199)
HDL: 48 mg/dL (ref >39)
LDL Calculated: 58 mg/dL (ref 0–99)
Triglycerides: 163 mg/dL — ABNORMAL HIGH (ref 0–149)
VLDL Cholesterol Cal: 33 mg/dL (ref 5–40)

## 2018-09-07 LAB — TSH: TSH: 3.04 u[IU]/mL (ref 0.450–4.500)

## 2018-09-09 ENCOUNTER — Other Ambulatory Visit: Payer: Self-pay | Admitting: Orthopedic Surgery

## 2018-09-09 ENCOUNTER — Telehealth: Payer: Self-pay | Admitting: Family Medicine

## 2018-09-09 ENCOUNTER — Telehealth: Payer: Self-pay

## 2018-09-09 DIAGNOSIS — M1612 Unilateral primary osteoarthritis, left hip: Secondary | ICD-10-CM

## 2018-09-09 NOTE — Telephone Encounter (Signed)
Acknowledged. Agree with plan.

## 2018-09-09 NOTE — Telephone Encounter (Signed)
FYI-I discussed with [name] her test results and the need for better control of the diabetes.  She states she is unable to take 2 Metformin in the evening due to side effects.  She states she had discussed this in the past.  Have advised her to try and take one of them with her lunch to see if she could tolerate it better that way.  She said she would try and will call us to let us know if she is able to do it that way.  She did admit that with the pandemic and having to stay confined in her home she is eating more and eating more of the wrong things.  She states she will try to do better but she feels that is a big reason for the worsening numbers.  I advised her that is she is unable to tolerate the Metformin I would ask you about other alternatives. She will let us now how things are going next week.

## 2018-09-09 NOTE — Telephone Encounter (Signed)
-----   Message from [place], [place] sent at 09/07/2018 12:28 PM EDT ----- Blood sugar and triglycerides a little elevated. Focus on better diet control and continue Glipizide 5 mg qd and Metformin 500 mg 1 tab q am and 2 tabs q pm. Recheck diabetes and labs in 3 months.

## 2018-09-23 ENCOUNTER — Ambulatory Visit
Admission: RE | Admit: 2018-09-23 | Discharge: 2018-09-23 | Disposition: A | Discharge status: Home / Self Care | Payer: Medicare Other | Source: Ambulatory Visit | Attending: Orthopedic Surgery | Admitting: Orthopedic Surgery

## 2018-09-23 ENCOUNTER — Other Ambulatory Visit: Payer: Self-pay

## 2018-09-23 DIAGNOSIS — M1612 Unilateral primary osteoarthritis, left hip: Secondary | ICD-10-CM | POA: Insufficient documentation

## 2018-09-29 ENCOUNTER — Other Ambulatory Visit: Payer: Self-pay

## 2018-09-29 ENCOUNTER — Ambulatory Visit: Payer: Medicare Other | Attending: Orthopedic Surgery

## 2018-09-29 DIAGNOSIS — M6281 Muscle weakness (generalized): Secondary | ICD-10-CM | POA: Diagnosis present

## 2018-09-29 DIAGNOSIS — M25552 Pain in left hip: Secondary | ICD-10-CM | POA: Insufficient documentation

## 2018-09-29 DIAGNOSIS — M25511 Pain in right shoulder: Secondary | ICD-10-CM | POA: Diagnosis present

## 2018-09-29 DIAGNOSIS — R262 Difficulty in walking, not elsewhere classified: Secondary | ICD-10-CM | POA: Diagnosis present

## 2018-09-29 NOTE — Patient Instructions (Signed)
Pt education on sleeping with pillow(s) between knees when in R S/L, maintaining equal weight bearing on each LE when standing, and femoral control with standing tasks to decrease stress the L lateral/posterior lateral hip. Pt verbalized understanding.
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
## 2018-09-30 ENCOUNTER — Ambulatory Visit: Payer: Medicare Other

## 2018-10-05 ENCOUNTER — Other Ambulatory Visit: Payer: Self-pay

## 2018-10-05 ENCOUNTER — Ambulatory Visit: Payer: Medicare Other

## 2018-10-05 DIAGNOSIS — R262 Difficulty in walking, not elsewhere classified: Secondary | ICD-10-CM

## 2018-10-05 DIAGNOSIS — M25511 Pain in right shoulder: Secondary | ICD-10-CM

## 2018-10-05 DIAGNOSIS — M25552 Pain in left hip: Secondary | ICD-10-CM

## 2018-10-05 DIAGNOSIS — M6281 Muscle weakness (generalized): Secondary | ICD-10-CM
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
## 2018-10-05 NOTE — Patient Instructions (Signed)
Seated hip extension isometrics   Sitting on a chair,    Squeeze your rear end muscles together and press your L foot onto the floor.    40% effort   Hold for 1 minute   Rest for 1 minute    Repeat 5 times   Perform 3 times daily.     Seated clamshell isometrics  Sitting on your bed (hips less than 90 degrees flexion)   Belt around thighs, which are shoulder width apart,    Press knees out against belt with 40 % effort for 1 minute   Rest for 1-2 minutes   Perform 5 times per set.     Do 3 sets per day. Each set to be performed a few hours apart.      Medbridge Access Code: [email or web address]  Seated B scapular retraction 10x3 with 5 seconds

## 2018-10-07 ENCOUNTER — Other Ambulatory Visit: Payer: Self-pay | Admitting: Internal Medicine

## 2018-10-07 ENCOUNTER — Other Ambulatory Visit: Payer: Self-pay

## 2018-10-07 ENCOUNTER — Other Ambulatory Visit: Payer: Self-pay | Admitting: Family Medicine

## 2018-10-07 ENCOUNTER — Ambulatory Visit: Payer: Medicare Other

## 2018-10-07 DIAGNOSIS — E119 Type 2 diabetes mellitus without complications: Secondary | ICD-10-CM

## 2018-10-07 DIAGNOSIS — M25552 Pain in left hip: Secondary | ICD-10-CM | POA: Diagnosis not present

## 2018-10-07 DIAGNOSIS — R262 Difficulty in walking, not elsewhere classified: Secondary | ICD-10-CM

## 2018-10-07 DIAGNOSIS — M25511 Pain in right shoulder: Secondary | ICD-10-CM

## 2018-10-07 DIAGNOSIS — M6281 Muscle weakness (generalized): Secondary | ICD-10-CM
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
## 2018-10-11 ENCOUNTER — Other Ambulatory Visit: Payer: Self-pay

## 2018-10-11 ENCOUNTER — Ambulatory Visit: Payer: Medicare Other | Attending: Orthopedic Surgery

## 2018-10-11 DIAGNOSIS — M6281 Muscle weakness (generalized): Secondary | ICD-10-CM | POA: Insufficient documentation

## 2018-10-11 DIAGNOSIS — M25511 Pain in right shoulder: Secondary | ICD-10-CM | POA: Insufficient documentation

## 2018-10-11 DIAGNOSIS — M25552 Pain in left hip: Secondary | ICD-10-CM | POA: Diagnosis present

## 2018-10-11 DIAGNOSIS — R262 Difficulty in walking, not elsewhere classified: Secondary | ICD-10-CM | POA: Insufficient documentation

## 2018-10-11 NOTE — Patient Instructions (Signed)
Medbridge Access Code: [email or web address]  Shoulder External Rotation and Scapular Retraction with Resistance Yellow band 10x3 daily  B scapular retraction resisted yellow band 10x3
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
## 2018-10-14 ENCOUNTER — Ambulatory Visit: Payer: Medicare Other

## 2018-10-14 ENCOUNTER — Other Ambulatory Visit: Payer: Self-pay

## 2018-10-14 DIAGNOSIS — M25552 Pain in left hip: Secondary | ICD-10-CM

## 2018-10-14 DIAGNOSIS — M25511 Pain in right shoulder: Secondary | ICD-10-CM | POA: Diagnosis not present

## 2018-10-14 DIAGNOSIS — M6281 Muscle weakness (generalized): Secondary | ICD-10-CM

## 2018-10-14 DIAGNOSIS — R262 Difficulty in walking, not elsewhere classified: Secondary | ICD-10-CM

## 2018-10-14 NOTE — Patient Instructions (Signed)
Stand to sit without UE assist onto regular chair with arms 5x2 to promote LE strength  Reviewed and given as part of her HEP. Pt demonstrated and verbalized understanding.
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
## 2018-10-18 ENCOUNTER — Other Ambulatory Visit: Payer: Self-pay

## 2018-10-18 ENCOUNTER — Ambulatory Visit: Payer: Medicare Other

## 2018-10-18 DIAGNOSIS — R262 Difficulty in walking, not elsewhere classified: Secondary | ICD-10-CM

## 2018-10-18 DIAGNOSIS — M25511 Pain in right shoulder: Secondary | ICD-10-CM

## 2018-10-18 DIAGNOSIS — M6281 Muscle weakness (generalized): Secondary | ICD-10-CM

## 2018-10-18 DIAGNOSIS — M25552 Pain in left hip: Secondary | ICD-10-CM
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
## 2018-10-21 ENCOUNTER — Other Ambulatory Visit: Payer: Self-pay

## 2018-10-21 ENCOUNTER — Ambulatory Visit: Payer: Medicare Other

## 2018-10-21 DIAGNOSIS — M6281 Muscle weakness (generalized): Secondary | ICD-10-CM

## 2018-10-21 DIAGNOSIS — M25552 Pain in left hip: Secondary | ICD-10-CM

## 2018-10-21 DIAGNOSIS — M25511 Pain in right shoulder: Secondary | ICD-10-CM

## 2018-10-21 DIAGNOSIS — R262 Difficulty in walking, not elsewhere classified: Secondary | ICD-10-CM
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
## 2018-10-25 ENCOUNTER — Ambulatory Visit: Payer: Medicare Other | Admitting: Physical Therapy

## 2018-10-27 ENCOUNTER — Other Ambulatory Visit: Payer: Self-pay

## 2018-10-27 ENCOUNTER — Ambulatory Visit: Payer: Medicare Other

## 2018-10-27 DIAGNOSIS — R262 Difficulty in walking, not elsewhere classified: Secondary | ICD-10-CM

## 2018-10-27 DIAGNOSIS — M25511 Pain in right shoulder: Secondary | ICD-10-CM | POA: Diagnosis not present

## 2018-10-27 DIAGNOSIS — M6281 Muscle weakness (generalized): Secondary | ICD-10-CM

## 2018-10-27 DIAGNOSIS — M25552 Pain in left hip: Secondary | ICD-10-CM
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
## 2018-11-01 ENCOUNTER — Ambulatory Visit: Payer: Medicare Other

## 2018-11-01 ENCOUNTER — Other Ambulatory Visit: Payer: Self-pay

## 2018-11-01 DIAGNOSIS — M6281 Muscle weakness (generalized): Secondary | ICD-10-CM

## 2018-11-01 DIAGNOSIS — M25511 Pain in right shoulder: Secondary | ICD-10-CM | POA: Diagnosis not present

## 2018-11-01 DIAGNOSIS — R262 Difficulty in walking, not elsewhere classified: Secondary | ICD-10-CM

## 2018-11-01 DIAGNOSIS — M25552 Pain in left hip: Secondary | ICD-10-CM
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
## 2018-11-03 ENCOUNTER — Ambulatory Visit: Payer: Medicare Other

## 2018-11-03 ENCOUNTER — Other Ambulatory Visit: Payer: Self-pay

## 2018-11-03 DIAGNOSIS — M6281 Muscle weakness (generalized): Secondary | ICD-10-CM

## 2018-11-03 DIAGNOSIS — M25511 Pain in right shoulder: Secondary | ICD-10-CM | POA: Diagnosis not present

## 2018-11-03 DIAGNOSIS — R262 Difficulty in walking, not elsewhere classified: Secondary | ICD-10-CM

## 2018-11-03 DIAGNOSIS — M25552 Pain in left hip: Secondary | ICD-10-CM

## 2018-11-03 NOTE — Patient Instructions (Signed)
Access Code: [email or web address]  URL: [email or web address]  Date: 11/03/2018  Prepared by: Joneen Boers   Exercises Seated Scapular Retraction - 10 reps - 3 sets - 5 seconds hold - 3x daily - 7x weekly Shoulder External Rotation and Scapular Retraction with Resistance - 10 reps - 3 sets - 1x daily - 7x weekly Scapular Retraction with Resistance - 10 reps - 3 sets - 1x daily - 7x weekly Standing Hip Abduction with Counter Support - 10 reps - 3 sets - 1x daily - 7x weekly
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
## 2018-11-08 ENCOUNTER — Ambulatory Visit: Payer: Medicare Other

## 2018-11-22 ENCOUNTER — Other Ambulatory Visit: Payer: Self-pay

## 2018-11-22 ENCOUNTER — Ambulatory Visit: Payer: Medicare Other | Attending: Orthopedic Surgery

## 2018-11-22 DIAGNOSIS — M6281 Muscle weakness (generalized): Secondary | ICD-10-CM | POA: Diagnosis present

## 2018-11-22 DIAGNOSIS — R262 Difficulty in walking, not elsewhere classified: Secondary | ICD-10-CM | POA: Diagnosis present

## 2018-11-22 DIAGNOSIS — M25511 Pain in right shoulder: Secondary | ICD-10-CM | POA: Diagnosis present

## 2018-11-22 DIAGNOSIS — M25552 Pain in left hip: Secondary | ICD-10-CM | POA: Diagnosis present
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
## 2018-11-25 ENCOUNTER — Other Ambulatory Visit: Payer: Self-pay

## 2018-11-25 ENCOUNTER — Ambulatory Visit: Payer: Medicare Other

## 2018-11-25 DIAGNOSIS — M25552 Pain in left hip: Secondary | ICD-10-CM

## 2018-11-25 DIAGNOSIS — R262 Difficulty in walking, not elsewhere classified: Secondary | ICD-10-CM

## 2018-11-25 DIAGNOSIS — M25511 Pain in right shoulder: Secondary | ICD-10-CM

## 2018-11-25 DIAGNOSIS — M6281 Muscle weakness (generalized): Secondary | ICD-10-CM

## 2018-11-25 NOTE — Patient Instructions (Signed)
  Sitting on a chair, place your right hand on the chair   Press your right hand onto the chair with your shoulder blade (keep your shoulder blade squeezed)    Hold for 5 seconds    Repeat 10 times   Perform 3 sets daily
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
## 2018-11-29 ENCOUNTER — Ambulatory Visit: Payer: Medicare Other

## 2018-12-02 ENCOUNTER — Ambulatory Visit: Payer: Medicare Other

## 2018-12-20 ENCOUNTER — Other Ambulatory Visit: Payer: Self-pay

## 2018-12-20 ENCOUNTER — Encounter: Payer: Self-pay | Admitting: Family Medicine

## 2018-12-20 ENCOUNTER — Ambulatory Visit (INDEPENDENT_AMBULATORY_CARE_PROVIDER_SITE_OTHER): Payer: Medicare Other | Admitting: Family Medicine

## 2018-12-20 VITALS — BP 120/78 | HR 78 | Temp 97.1°F | Wt 166.0 lb

## 2018-12-20 DIAGNOSIS — I10 Essential (primary) hypertension: Secondary | ICD-10-CM | POA: Diagnosis not present

## 2018-12-20 DIAGNOSIS — I480 Paroxysmal atrial fibrillation: Secondary | ICD-10-CM

## 2018-12-20 DIAGNOSIS — E1100 Type 2 diabetes mellitus with hyperosmolarity without nonketotic hyperglycemic-hyperosmolar coma (NKHHC): Secondary | ICD-10-CM | POA: Diagnosis not present

## 2018-12-20 DIAGNOSIS — Z23 Encounter for immunization: Secondary | ICD-10-CM | POA: Diagnosis not present

## 2018-12-20 DIAGNOSIS — E7800 Pure hypercholesterolemia, unspecified: Secondary | ICD-10-CM | POA: Diagnosis not present

## 2018-12-20 NOTE — Progress Notes (Signed)
[name]  MRN: [id number] DOB: 28-Apr-1931  Subjective:  HPI   The patient is an 85 year old female who presents for follow up of her chronic health issues.  She was last seen on 08/30/18.  Diabetes-Last A1C was 7.3, which was up from the previous one of 6.8 in January.  Hypercholesterolemia-Triglycerides elevated at 163.  Patient is on low dose of Simvastatin and tolerating the 20 mg.  Lab Results  Component Value Date   CHOL 139 09/06/2018   HDL 48 09/06/2018   LDLCALC 58 09/06/2018   TRIG 163 (H) 09/06/2018   CHOLHDL 2.9 09/06/2018   Hypertension-last visit patient was instructed to continue Lisinopril, Metoprolol and Lasix.   Lab Results  Component Value Date   CREATININE 0.90 09/06/2018   BUN 19 09/06/2018   NA 143 09/06/2018   K 4.4 09/06/2018   CL 104 09/06/2018   CO2 24 09/06/2018     Patient Active Problem List   Diagnosis Date Noted  . Mixed hyperlipidemia 04/29/2017  . Lower extremity edema 12/24/2016  . Fatigue 12/24/2016  . Current use of long term anticoagulation 09/17/2016  . Atrial fibrillation ([place]) 08/13/2016  . Seizure ([place])   . Faintness   . Syncope 11/21/2015  . ARF (acute renal failure) ([place]) 10/26/2015  . Laceration of right lower leg 09/28/2015  . Bursitis of hip 08/27/2015  . Senile purpura ([place]) 01/01/2015  . Edema 08/15/2014  . Arthritis 07/12/2014  . Basal cell carcinoma 07/12/2014  . CC (collagenous colitis) 07/12/2014  . Essential hypertension 07/12/2014  . Hypercholesteremia 07/12/2014  . Adiposity 07/12/2014  . Acne erythematosa 07/12/2014  . Diabetes mellitus, type 2 ([place]) 07/12/2014  . 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 Avitaminosis D 07/12/2014  . H/O malignant neoplasm of skin 10/10/2011   Past Medical History:  Diagnosis Date  . Chronic diarrhea   . Collagenous colitis   . Diabetes mellitus without complication ([place])   . Diverticulitis   . Heart murmur    at birth, none since  . History of echocardiogram    a.  11/2015: echo showing EF of 60-65% with no WMA. Grade 1 DD and mild MR noted.   . Hypertension   . PAF (paroxysmal atrial fibrillation) ([place])    a. initially diagnosed in 08/2016 --> started on Eliquis  . Syncope    a. initially occurring in Fall 2016 b. Hospitalized in 10/2015 and 11/2015 for recurrent episodes.    Past Surgical History:  Procedure Laterality Date  . ABDOMINAL HYSTERECTOMY    . BACK SURGERY  08/08/2008   Lumbar discectomy  . NECK SURGERY  1980   Family History  Problem Relation Age of Onset  . Hypertension Mother   . Diabetes Mother   . Lung cancer Father    Social History   Socioeconomic History  . Marital status: Widowed    Spouse name: Not on file  . Number of children: 2  . Years of education: Not on file  . Highest education level: Associate degree: academic program  Occupational History  . Occupation: retired  Scientific laboratory technician  . Financial resource strain: Not hard at all  . Food insecurity    Worry: Never true    Inability: Never true  . Transportation needs    Medical: No    Non-medical: No  Tobacco Use  . Smoking status: Former Smoker    Quit date: 03/09/1978    Years since quitting: 40.8  . Smokeless tobacco: Never Used  Substance and Sexual  Activity  . Alcohol use: Yes    Alcohol/week: 0.0 - 1.0 standard drinks  . Drug use: No  . Sexual activity: Not on file  Lifestyle  . Physical activity    Days per week: 0 days    Minutes per session: 0 min  . Stress: To some extent  Relationships  . Social Herbalist on phone: Patient refused    Gets together: Patient refused    Attends religious service: Patient refused    Active member of club or organization: Patient refused    Attends meetings of clubs or organizations: Patient refused    Relationship status: Patient refused  . Intimate partner violence    Fear of current or ex partner: Patient refused    Emotionally abused: Patient refused    Physically abused: Patient refused     Forced sexual activity: Patient refused  Other Topics Concern  . Not on file  Social History Narrative  . Not on file   Outpatient Encounter Medications as of 12/20/2018  Medication Sig  . apixaban (ELIQUIS) 5 MG TABS tablet Take 1 tablet (5 mg total) by mouth 2 (two) times daily.  . budesonide (ENTOCORT EC) 3 MG 24 hr capsule Take 1 capsule (3 mg total) by mouth daily.  . Cholecalciferol (VITAMIN D) 2000 UNITS CAPS Take 1 capsule by mouth daily.   . diphenhydramine-acetaminophen (TYLENOL PM) 25-500 MG TABS tablet Take 1 tablet by mouth at bedtime as needed.  . furosemide (LASIX) 20 MG tablet TAKE ONE TABLET BY MOUTH DAILY AS NEEDED  . glipiZIDE (GLUCOTROL XL) 5 MG 24 hr tablet TAKE ONE TABLET BY MOUTH DAILY WITH BREAKFAST  . lisinopril (PRINIVIL,ZESTRIL) 5 MG tablet TAKE ONE TABLET BY MOUTH DAILY  . metFORMIN (GLUCOPHAGE) 500 MG tablet TAKE ONE TABLET BY MOUTH TWICE A DAY WITH MEALS  . methylPREDNISolone (MEDROL) 4 MG tablet Take 4 mg by mouth daily.  . metoprolol succinate (TOPROL-XL) 25 MG 24 hr tablet TAKE ONE TABLET BY MOUTH DAILY  . Multiple Vitamin tablet Take 1 tablet by mouth every evening. Reported on 03/13/2015  . simvastatin (ZOCOR) 20 MG tablet TAKE ONE TABLET BY MOUTH EVERY NIGHT AT BEDTIME   No facility-administered encounter medications on file as of 12/20/2018.    Allergies  Allergen Reactions  . Morphine Sulfate Other (See Comments)    BP bottoms out  . Penicillins Diarrhea    Has patient had a PCN reaction causing immediate rash, facial/tongue/throat swelling, SOB or lightheadedness with hypotension: Yes Has patient had a PCN reaction causing severe rash involving mucus membranes or skin necrosis: Yes Has patient had a PCN reaction that required hospitalization No Has patient had a PCN reaction occurring within the last 10 years: No If all of the above answers are "NO", then may proceed with Cephalosporin use.   Review of Systems  Constitutional: Negative.    HENT: Negative.   Respiratory: Negative.   Cardiovascular: Negative.     Objective:  BP 120/78 (BP Location: Right Arm, Patient Position: Sitting, Cuff Size: Normal)   Pulse 78   Wt 166 lb (75.3 kg)   SpO2 96%   BMI 30.36 kg/m   Physical Exam  Constitutional: She is oriented to person, place, and time and well-developed, well-nourished, and in no distress.  HENT:  Head: Normocephalic.  Eyes: Conjunctivae are normal.  Neck: Neck supple.  Cardiovascular: Normal rate.  Pulmonary/Chest: Effort normal.  Abdominal: Soft.  Musculoskeletal: Normal range of motion.  Neurological: She is alert  and oriented to person, place, and time.  Skin: No rash noted.  Psychiatric: Mood, affect and judgment normal.   Diabetic Foot Form - Detailed   Diabetic Foot Exam - detailed Diabetic Foot exam was performed with the following findings: Yes 12/20/2018  2:58 PM  Visual Foot Exam completed.: Yes  Can the patient see the bottom of their feet?: Yes Are the shoes appropriate in style and fit?: Yes Is there swelling or and abnormal foot shape?: No Is there a claw toe deformity?: No Is there elevated skin temparature?: No Is there foot or ankle muscle weakness?: No Normal Range of Motion: Yes Pulse Foot Exam completed.: Yes  Right posterior Tibialias: Present Left posterior Tibialias: Present  Right Dorsalis Pedis: Present Left Dorsalis Pedis: Present  Sensory Foot Exam Completed.: Yes Semmes-Weinstein Monofilament Test R Site 1-Great Toe: Pos L Site 1-Great Toe: Pos    Comments: Larger amount of varicose veins both feet.     Assessment and Plan :   1. Type 2 diabetes mellitus with hyperosmolarity without coma, without long-term current use of insulin ([place]) The last Hgb A1C was 7.3% on 08-30-18 and she did not increase the Metformin 500 mg to 1 q am and 2 q pm (only one BID now) with Glipizide 5 mg qd. Recommend she continue diabetic diet and recheck labs to see if this increase in medication  will be necessary. - CBC with Differential/Platelet - Comprehensive metabolic panel - Lipid panel - Hemoglobin A1c  2. Hypercholesteremia Tolerating Simvastatin 20 mg qd and recheck CMP, Lipid Panel with TSH. No significant muscle aches and pains. - Comprehensive metabolic panel - Lipid panel - TSH  3. Essential hypertension Stable BP without dizziness or weakness. Tolerating the Toprol-XL 25 mg qd with Lisinopril 5 mg qd. Continue to restrict salt intake and get routine labs. - CBC with Differential/Platelet - Comprehensive metabolic panel - Lipid panel - TSH  4. Paroxysmal atrial fibrillation (H[facility]) No palpitations or chest pains. No dyspnea or edema. Feeling well. Recheck labs and continue follow up with Dr. Saunders Revel (cardiologist). - CBC with Differential/Platelet - Comprehensive metabolic panel - TSH  5. Need for influenza vaccination -Flu Vaccine QUAD High Dose(Fluad)

## 2018-12-20 NOTE — Progress Notes (Signed)
Follow-up Outpatient Visit Date: 12/22/2018  Primary Care Provider: Margo Common, [place] [address]  Chief Complaint: Fatigue  HPI:  [name] is a 85 y.o. year-old female with history of paroxysmal atrial fibrillation, hypertension, hyperlipidemia, type 2 diabetes mellitus, and recurrent syncope (thought to be vasovagal), who presents for follow-up of syncope and atrial fibrillation.  We last spoke via virtual visit in mid April, at which time [name] was doing well.  As she suffered from a UTI in February and was told that she was in atrial fibrillation by the paramedics.  However, EKG in the emergency department demonstrated sinus rhythm.  Though home blood pressure was mildly elevated at the time of our virtual visit, we agreed to defer medication changes.  Today, [name] reports that she feels a little more fatigued than at past visits.  She sometimes naps in the afternoons if she is sitting down.  If she stays active, she does not feel like she needs to nap.  She denies chest pain, shortness of breath, orthopnea, palpitations, and lightheadedness.  Her daughter is concerned about gait instability and falls.  [name] has chronic pain in the left hip (surgery previously recommended, though she wishes to avoid this) and new right ankle pain.  [name] remains compliant with her medications.  She has not had any significant bleeding.  --------------------------------------------------------------------------------------------------  Cardiovascular History & Procedures: Cardiovascular Problems:  Paroxysmal atrial fibrillation  Syncope  Risk Factors:  Diabetes mellitus, hypertension, and age  Cath/PCI:  None.  CV Surgery:  None.  EP Procedures and Devices:  30-day event monitor (12/2015): Predominant rhythm was sinus with an average rate 60 bpm (range 45-134 bpm). Occasional PACs and PVCs were noted. There were 2 episodes of  superventricular tachycardia lasting up to 14 beats with a maximum rate of 136 bpm. No sustained arrhythmias were identified. Symptoms of feeling "tired and fatigued" we'll corresponded to sinus rhythm and sinus rhythm with PACs.  Non-Invasive Evaluation(s):  ABI's (09/15/16): Normal (1.1 bilaterally)  Pharmacologic myocardial perfusion stress test (01/08/16): Low risk study with small apical anterior and apical defect more pronounced on the rest images consistent with shifting breast attenuation. No ischemia identified. Normal LVEF (55-65%).  Transthoracic echocardiogram (11/21/15): Normal LV size and function (EF 60-65%) with grade 1 diastolic dysfunction. Mild MR. Normal RV size and function. Normal pulmonary artery pressure.  Carotid Doppler (11/22/15): No hemodynamically significant plaque or stenosis in either cervical carotid artery.  Recent CV Pertinent Labs: Lab Results  Component Value Date   CHOL 149 12/21/2018   HDL 52 12/21/2018   LDLCALC 69 12/21/2018   TRIG 163 (H) 12/21/2018   CHOLHDL 2.9 12/21/2018   K 4.3 12/21/2018   MG 1.8 08/13/2016   BUN 23 12/21/2018   CREATININE 0.88 12/21/2018    Past medical and surgical history were reviewed and updated in EPIC.  Current Meds  Medication Sig  . apixaban (ELIQUIS) 5 MG TABS tablet Take 1 tablet (5 mg total) by mouth 2 (two) times daily.  . budesonide (ENTOCORT EC) 3 MG 24 hr capsule Take 1 capsule (3 mg total) by mouth daily.  . Cholecalciferol (VITAMIN D) 2000 UNITS CAPS Take 1 capsule by mouth daily.   . diphenhydramine-acetaminophen (TYLENOL PM) 25-500 MG TABS tablet Take 1 tablet by mouth at bedtime as needed.  . furosemide (LASIX) 20 MG tablet TAKE ONE TABLET BY MOUTH DAILY AS NEEDED  . glipiZIDE (GLUCOTROL XL) 5 MG 24 hr tablet TAKE  ONE TABLET BY MOUTH DAILY WITH BREAKFAST  . lisinopril (PRINIVIL,ZESTRIL) 5 MG tablet TAKE ONE TABLET BY MOUTH DAILY  . metFORMIN (GLUCOPHAGE) 500 MG tablet TAKE ONE TABLET BY MOUTH TWICE  A DAY WITH MEALS  . methylPREDNISolone (MEDROL) 4 MG tablet Take 4 mg by mouth daily.  . metoprolol succinate (TOPROL-XL) 25 MG 24 hr tablet TAKE ONE TABLET BY MOUTH DAILY  . Multiple Vitamin tablet Take 1 tablet by mouth every evening. Reported on 03/13/2015  . simvastatin (ZOCOR) 20 MG tablet TAKE ONE TABLET BY MOUTH EVERY NIGHT AT BEDTIME    Allergies: Morphine sulfate and Penicillins  Social History   Tobacco Use  . Smoking status: Former Smoker    Quit date: 03/09/1978    Years since quitting: 40.8  . Smokeless tobacco: Never Used  Substance Use Topics  . Alcohol use: Yes    Alcohol/week: 0.0 - 1.0 standard drinks  . Drug use: No    Family History  Problem Relation Age of Onset  . Hypertension Mother   . Diabetes Mother   . Lung cancer Father     Review of Systems: A 12-system review of systems was performed and was negative except as noted in the HPI.  --------------------------------------------------------------------------------------------------  Physical Exam: BP 112/60 (BP Location: Left Arm, Patient Position: Sitting, Cuff Size: Normal)   Pulse 70   Ht 5\' 2"  (1.575 m)   Wt 166 lb 4 oz (75.4 kg)   BMI 30.41 kg/m   General:  NAD.  Accompanied by her daughter. HEENT: No conjunctival pallor or scleral icterus. Facemask in place Neck: Supple without lymphadenopathy, thyromegaly, JVD, or HJR.  Lungs: Normal work of breathing. Clear to auscultation bilaterally without wheezes or crackles. Heart: Regular rate and rhythm without murmurs, rubs, or gallops. Non-displaced PMI. Abd: Bowel sounds present. Soft, NT/ND without hepatosplenomegaly Ext: No lower extremity edema. Radial, PT, and DP pulses are 2+ bilaterally. Skin: Warm and dry without rash.  EKG:  NSR with sinus arrhythmia.  No significant abnormality.  Lab Results  Component Value Date   WBC 6.1 12/21/2018   HGB 13.3 12/21/2018   HCT 40.0 12/21/2018   MCV 92 12/21/2018   PLT 270 12/21/2018    Lab  Results  Component Value Date   NA 140 12/21/2018   K 4.3 12/21/2018   CL 103 12/21/2018   CO2 22 12/21/2018   BUN 23 12/21/2018   CREATININE 0.88 12/21/2018   GLUCOSE 138 (H) 12/21/2018   ALT 30 12/21/2018    Lab Results  Component Value Date   CHOL 149 12/21/2018   HDL 52 12/21/2018   LDLCALC 69 12/21/2018   TRIG 163 (H) 12/21/2018   CHOLHDL 2.9 12/21/2018    --------------------------------------------------------------------------------------------------  ASSESSMENT AND PLAN: Paroxysmal atrial fibrillation: EKG today again shows sinus rhythm.  No symptoms to suggest recurrence of a-fib.  We will continue her current medications, including metoprolol and apixaban.  If fatigue worsens, we may need to readdress discontinuation of metoprolol.  Fatigue: Chronic and likely multifactorial.  I do not recommend any medication changes or additional testing at this time.  Hip and ankle pain: Discomfort (especially at the left hip) seem to be impairing Ms. Feliz's balance.  I recommend that she use a walker, if possible, to prevent falls.  She should follow-up with her orthopedist to discuss treatment options.  Though surgery at 85 years of age is not without risk, I feel that Ms. Umana is optimized from a cardiac standpoint and would likely tolerate an  operation reasonably well.  Hypertension: BP is well-controlled today.  No medication changes at this time.  Follow-up: Return to clinic in 6 months.  Nelva Bush, MD 12/22/2018 10:15 AM

## 2018-12-22 ENCOUNTER — Ambulatory Visit (INDEPENDENT_AMBULATORY_CARE_PROVIDER_SITE_OTHER): Payer: Medicare Other | Admitting: Internal Medicine

## 2018-12-22 ENCOUNTER — Other Ambulatory Visit: Payer: Self-pay

## 2018-12-22 ENCOUNTER — Encounter: Payer: Self-pay | Admitting: Internal Medicine

## 2018-12-22 VITALS — BP 112/60 | HR 70 | Ht 62.0 in | Wt 166.2 lb

## 2018-12-22 DIAGNOSIS — R5383 Other fatigue: Secondary | ICD-10-CM | POA: Diagnosis not present

## 2018-12-22 DIAGNOSIS — M25559 Pain in unspecified hip: Secondary | ICD-10-CM | POA: Diagnosis not present

## 2018-12-22 DIAGNOSIS — M25571 Pain in right ankle and joints of right foot: Secondary | ICD-10-CM

## 2018-12-22 DIAGNOSIS — I10 Essential (primary) hypertension: Secondary | ICD-10-CM | POA: Diagnosis not present

## 2018-12-22 DIAGNOSIS — I480 Paroxysmal atrial fibrillation: Secondary | ICD-10-CM

## 2018-12-22 LAB — CBC WITH DIFFERENTIAL/PLATELET
Basophils Absolute: 0 10*3/uL (ref 0.0–0.2)
Basos: 0 %
EOS (ABSOLUTE): 0.2 10*3/uL (ref 0.0–0.4)
Eos: 3 %
Hematocrit: 40 % (ref 34.0–46.6)
Hemoglobin: 13.3 g/dL (ref 11.1–15.9)
Immature Grans (Abs): 0 10*3/uL (ref 0.0–0.1)
Immature Granulocytes: 0 %
Lymphocytes Absolute: 2.4 10*3/uL (ref 0.7–3.1)
Lymphs: 40 %
MCH: 30.4 pg (ref 26.6–33.0)
MCHC: 33.3 g/dL (ref 31.5–35.7)
MCV: 92 fL (ref 79–97)
Monocytes Absolute: 0.7 10*3/uL (ref 0.1–0.9)
Monocytes: 12 %
Neutrophils Absolute: 2.7 10*3/uL (ref 1.4–7.0)
Neutrophils: 45 %
Platelets: 270 10*3/uL (ref 150–450)
RBC: 4.37 x10E6/uL (ref 3.77–5.28)
RDW: 11.9 % (ref 11.7–15.4)
WBC: 6.1 10*3/uL (ref 3.4–10.8)

## 2018-12-22 LAB — LIPID PANEL
Chol/HDL Ratio: 2.9 ratio (ref 0.0–4.4)
Cholesterol, Total: 149 mg/dL (ref 100–199)
HDL: 52 mg/dL (ref >39)
LDL Chol Calc (NIH): 69 mg/dL (ref 0–99)
Triglycerides: 163 mg/dL — ABNORMAL HIGH (ref 0–149)
VLDL Cholesterol Cal: 28 mg/dL (ref 5–40)

## 2018-12-22 LAB — COMPREHENSIVE METABOLIC PANEL
ALT: 30 IU/L (ref 0–32)
AST: 20 IU/L (ref 0–40)
Albumin/Globulin Ratio: 1.9 (ref 1.2–2.2)
Albumin: 4.1 g/dL (ref 3.6–4.6)
Alkaline Phosphatase: 65 IU/L (ref 39–117)
BUN/Creatinine Ratio: 26 (ref 12–28)
BUN: 23 mg/dL (ref 8–27)
Bilirubin Total: 0.5 mg/dL (ref 0.0–1.2)
CO2: 22 mmol/L (ref 20–29)
Calcium: 9.4 mg/dL (ref 8.7–10.3)
Chloride: 103 mmol/L (ref 96–106)
Creatinine, Ser: 0.88 mg/dL (ref 0.57–1.00)
GFR calc Af Amer: 69 mL/min/{1.73_m2} (ref >59)
GFR calc non Af Amer: 60 mL/min/{1.73_m2} (ref >59)
Globulin, Total: 2.2 g/dL (ref 1.5–4.5)
Glucose: 138 mg/dL — ABNORMAL HIGH (ref 65–99)
Potassium: 4.3 mmol/L (ref 3.5–5.2)
Sodium: 140 mmol/L (ref 134–144)
Total Protein: 6.3 g/dL (ref 6.0–8.5)

## 2018-12-22 LAB — HEMOGLOBIN A1C
Est. average glucose Bld gHb Est-mCnc: 154 mg/dL
Hgb A1c MFr Bld: 7 % — ABNORMAL HIGH (ref 4.8–5.6)

## 2018-12-22 LAB — TSH: TSH: 3.14 u[IU]/mL (ref 0.450–4.500)

## 2018-12-22 MED ORDER — METOPROLOL SUCCINATE ER 25 MG PO TB24: 25.0000 mg | ORAL_TABLET | Freq: Every day | ORAL | 2 refills | Status: DC

## 2018-12-22 NOTE — Patient Instructions (Signed)
Medication Instructions:  Your physician recommends that you continue on your current medications as directed. Please refer to the Current Medication list given to you today.  If you need a refill on your cardiac medications before your next appointment, please call your pharmacy.   Lab work: NONE If you have labs (blood work) drawn today and your tests are completely normal, you will receive your results only by: . MyChart Message (if you have MyChart) OR . A paper copy in the mail If you have any lab test that is abnormal or we need to change your treatment, we will call you to review the results.  Testing/Procedures: NONE  Follow-Up: At [facility], you and your health needs are our priority.  As part of our continuing mission to provide you with exceptional heart care, we have created designated Provider Care Teams.  These Care Teams include your primary Cardiologist (physician) and Advanced Practice Providers (APPs -  Physician Assistants and Nurse Practitioners) who all work together to provide you with the care you need, when you need it. You will need a follow up appointment in 6 months.   Please call our office 2 months in advance to schedule this appointment.  You may see Christopher End, MD or one of the following Advanced Practice Providers on your designated Care Team:   Christopher Berge, NP Ryan Dunn, PA-C . Jacquelyn Visser, PA-C    

## 2018-12-23 ENCOUNTER — Encounter: Payer: Self-pay | Admitting: Internal Medicine

## 2018-12-23 DIAGNOSIS — M25559 Pain in unspecified hip: Secondary | ICD-10-CM | POA: Insufficient documentation

## 2018-12-23 DIAGNOSIS — M25571 Pain in right ankle and joints of right foot: Secondary | ICD-10-CM | POA: Insufficient documentation

## 2019-01-05 ENCOUNTER — Other Ambulatory Visit: Payer: Self-pay | Admitting: Internal Medicine

## 2019-01-05 NOTE — Telephone Encounter (Signed)
Refill request

## 2019-01-05 NOTE — Telephone Encounter (Signed)
Pt's age 85, wt 75.4 kg, SCr 0.88, CrCl 55.63, last ov w/ Dr. Saunders Revel 12/22/18.

## 2019-03-21 ENCOUNTER — Other Ambulatory Visit: Payer: Self-pay | Admitting: Family Medicine

## 2019-03-21 ENCOUNTER — Other Ambulatory Visit: Payer: Self-pay | Admitting: Internal Medicine

## 2019-03-21 DIAGNOSIS — I10 Essential (primary) hypertension: Secondary | ICD-10-CM

## 2019-03-21 NOTE — Telephone Encounter (Signed)
Eliquis refill request.

## 2019-03-21 NOTE — Telephone Encounter (Signed)
Patient was too soon for her refill

## 2019-03-21 NOTE — Telephone Encounter (Signed)
Pt's age 86, wt 75.4 kg, SCr 0.88, CrCl 55.63, last ov w/ CE 12/22/18.

## 2019-03-21 NOTE — Telephone Encounter (Signed)
Patient called to have the nurse call regarding two of her prescriptions that were denied.  Please call patient to explain at [phone]

## 2019-03-25 ENCOUNTER — Telehealth: Payer: Self-pay | Admitting: Internal Medicine

## 2019-03-25 NOTE — Telephone Encounter (Signed)
   [facility] Pre-operative Risk Assessment    Request for surgical clearance:  1. What type of surgery is being performed? Left hip abductor tendon repair    2. When is this surgery scheduled? TBD  3. What type of clearance is required (medical clearance vs. Pharmacy clearance to hold med vs. Both)? None listed, please advise if needed   4. Are there any medications that need to be held prior to surgery and how long? None listed, please advise if needed    5. Practice name and name of physician performing surgery? [facility], Dr Hessie Knows  6. What is your office phone number [phone]    7.   What is your office fax number [phone]  8.   Anesthesia type (None, local, MAC, general) ? Not listed    Ace Gins 03/25/2019, 4:15 PM  _________________________________________________________________   (provider comments below)

## 2019-03-25 NOTE — Telephone Encounter (Signed)
   Primary Cardiologist: Nelva Bush, MD  Chart reviewed as part of pre-operative protocol coverage. Patient was contacted 03/25/2019 in reference to pre-operative risk assessment for pending surgery as outlined below.  [name] was last seen on 12/22/2018 by Dr. Saunders Revel.  Since that day, [name] has done well from a cardiac standpoint. While she has had hip pain, she can still complete 4 METs without experiencing chest pain or SOB. No complaints of palpitations.   Therefore, based on ACC/AHA guidelines, the patient would be at acceptable risk for the planned procedure without further cardiovascular testing.   Per pharmacy recommendations, patient can hold eliquis 3 days prior to her upcoming hip surgery and should restart when cleared to do so by Dr. Rudene Christians.   I will route this recommendation to the requesting party via Epic fax function and remove from pre-op pool.  Please call with questions.  Abigail Butts, PA-C 03/25/2019, 5:05 PM

## 2019-03-25 NOTE — Telephone Encounter (Signed)
Patient with diagnosis of ATRIAL FIBRILLATION on ELIQUIS for anticoagulation.    Procedure: LEFT HIP TENDON REPAINR Date of procedure: TBD  CHADS2-VASc score of  5 (HTN, AGE X 2, DM2, female)  *NO history of stroke/CVA/VTE noted*  CrCl = 55ML/MIN Platelet count = 270  Per office protocol, patient can hold ELIQUIS for 3 days prior to procedure.

## 2019-04-04 ENCOUNTER — Telehealth: Payer: Self-pay

## 2019-04-04 NOTE — Telephone Encounter (Signed)
NOTES ON FILE FROM [facility] [phone] REFERRAL TO [facility]

## 2019-04-05 ENCOUNTER — Other Ambulatory Visit: Payer: Self-pay | Admitting: Family Medicine

## 2019-04-05 ENCOUNTER — Other Ambulatory Visit: Payer: Self-pay | Admitting: Orthopedic Surgery

## 2019-04-05 DIAGNOSIS — I10 Essential (primary) hypertension: Secondary | ICD-10-CM
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
## 2019-04-07 ENCOUNTER — Ambulatory Visit: Payer: Medicare Other | Admitting: Internal Medicine

## 2019-04-13 ENCOUNTER — Encounter: Payer: Self-pay | Admitting: Internal Medicine

## 2019-04-13 ENCOUNTER — Ambulatory Visit: Payer: Medicare Other | Admitting: Internal Medicine

## 2019-04-13 ENCOUNTER — Other Ambulatory Visit: Payer: Self-pay

## 2019-04-13 VITALS — BP 128/68 | HR 69 | Ht 61.5 in | Wt 164.0 lb

## 2019-04-13 DIAGNOSIS — I480 Paroxysmal atrial fibrillation: Secondary | ICD-10-CM

## 2019-04-13 DIAGNOSIS — Z01810 Encounter for preprocedural cardiovascular examination: Secondary | ICD-10-CM | POA: Diagnosis not present

## 2019-04-13 DIAGNOSIS — R55 Syncope and collapse: Secondary | ICD-10-CM

## 2019-04-13 DIAGNOSIS — I10 Essential (primary) hypertension: Secondary | ICD-10-CM

## 2019-04-13 MED ORDER — APIXABAN 5 MG PO TABS: 5.0000 mg | ORAL_TABLET | Freq: Two times a day (BID) | ORAL | 2 refills | Status: DC

## 2019-04-13 NOTE — Patient Instructions (Signed)
Medication Instructions:  Your physician recommends that you continue on your current medications as directed. Please refer to the Current Medication list given to you today.  *If you need a refill on your cardiac medications before your next appointment, please call your pharmacy*  Lab Work: none If you have labs (blood work) drawn today and your tests are completely normal, you will receive your results only by: Marland Kitchen MyChart Message (if you have MyChart) OR . A paper copy in the mail If you have any lab test that is abnormal or we need to change your treatment, we will call you to review the results.  Testing/Procedures: Your physician has requested that you have an echocardiogram as soon as possible as patient has surgery in a few weeks. Echocardiography is a painless test that uses sound waves to create images of your heart. It provides your doctor with information about the size and shape of your heart and how well your heart's chambers and valves are working. This procedure takes approximately one hour. There are no restrictions for this procedure. You may get an IV, if needed, to receive an ultrasound enhancing agent through to better visualize your heart.   Follow-Up: At [facility], you and your health needs are our priority.  As part of our continuing mission to provide you with exceptional heart care, we have created designated Provider Care Teams.  These Care Teams include your primary Cardiologist (physician) and Advanced Practice Providers (APPs -  Physician Assistants and Nurse Practitioners) who all work together to provide you with the care you need, when you need it.  Your next appointment:   6 month(s)  The format for your next appointment:   In Person  Provider:    You may see Nelva Bush, MD or one of the following Advanced Practice Providers on your designated Care Team:    Murray Hodgkins, NP  Christell Faith, PA-C  Marrianne Mood, PA-C

## 2019-04-13 NOTE — Progress Notes (Signed)
Follow-up Outpatient Visit Date: 04/13/2019  Primary Care Provider: Margo Common, [address] [place] [address]  Chief Complaint: Fall  HPI:  [name] is a 86 y.o. female with history of paroxysmal atrial fibrillation, hypertension, hyperlipidemia, type 2 diabetes mellitus, and recurrent syncope (thought to be vasovagal), who presents for preoperative cardiovascular risk assessment in anticipation of left hip surgery and evaluation of recent fall with loss of consciousness.  I last saw [name] in 12/2018, at which time she reported feeling more fatigued than at previous visits.  Her daughter was also concerned about gait instability and falls.  No medication changes or additional testing were recommended at that time.  [name] was recently at [facility] with her daughter.  She lost her balance after touching and escalator and fell, landing on her left shoulder.  She felt fine before the fall and did not get dizzy or pass out before falling.  However, shortly after landing on the floor, the patient's daughter noted her mother to be transiently unconscious for a few seconds (less than 10 seconds on two occasions).  She was evaluated by paramedics and advised to be transported to the ED for further evaluation.  They reportedly stated that [name] was NOT in atrial fibrillation at the time of their evaluation.  However, the patient declined, as she felt back to her usual state of health other then left shoulder soreness and mild fatigue.  The paramedics thought that [name] was likely dehydrated.  She notes that she had not been drinking much that day.  [name] has not had any other episodes of syncope/loss of consciousness.  She also denies chest pain, shortness of breath, palpitations, and edema.  Her mobility has been limited by left hip pain, for which she is scheduled to undergo surgery by Dr. Rudene Christians later this  month.  --------------------------------------------------------------------------------------------------  Cardiovascular History & Procedures: Cardiovascular Problems:  Paroxysmal atrial fibrillation  Syncope  Risk Factors:  Diabetes mellitus, hypertension, and age  Cath/PCI:  None.  CV Surgery:  None.  EP Procedures and Devices:  30-day event monitor (12/2015): Predominant rhythm was sinus with an average rate 60 bpm (range 45-134 bpm). Occasional PACs and PVCs were noted. There were 2 episodes of superventricular tachycardia lasting up to 14 beats with a maximum rate of 136 bpm. No sustained arrhythmias were identified. Symptoms of feeling "tired and fatigued" we'll corresponded to sinus rhythm and sinus rhythm with PACs.  Non-Invasive Evaluation(s):  ABI's (09/15/16): Normal (1.1 bilaterally)  Pharmacologic myocardial perfusion stress test (01/08/16): Low risk study with small apical anterior and apical defect more pronounced on the rest images consistent with shifting breast attenuation. No ischemia identified. Normal LVEF (55-65%).  Transthoracic echocardiogram (11/21/15): Normal LV size and function (EF 60-65%) with grade 1 diastolic dysfunction. Mild MR. Normal RV size and function. Normal pulmonary artery pressure.  Carotid Doppler (11/22/15): No hemodynamically significant plaque or stenosis in either cervical carotid artery.  Recent CV Pertinent Labs: Lab Results  Component Value Date   CHOL 149 12/21/2018   HDL 52 12/21/2018   LDLCALC 69 12/21/2018   TRIG 163 (H) 12/21/2018   CHOLHDL 2.9 12/21/2018   K 4.3 12/21/2018   MG 1.8 08/13/2016   BUN 23 12/21/2018   CREATININE 0.88 12/21/2018    Past medical and surgical history were reviewed and updated in EPIC.  Current Meds  Medication Sig  . apixaban (ELIQUIS) 5 MG TABS tablet Take 1 tablet (5 mg total) by  mouth 2 (two) times daily.  . Cholecalciferol (VITAMIN D) 2000 UNITS CAPS Take 1 capsule by  mouth daily.   . diphenhydramine-acetaminophen (TYLENOL PM) 25-500 MG TABS tablet Take 1 tablet by mouth at bedtime as needed.  . furosemide (LASIX) 20 MG tablet TAKE ONE TABLET BY MOUTH DAILY AS NEEDED  . glipiZIDE (GLUCOTROL XL) 5 MG 24 hr tablet TAKE ONE TABLET BY MOUTH DAILY WITH BREAKFAST  . lisinopril (ZESTRIL) 5 MG tablet TAKE ONE TABLET BY MOUTH DAILY  . metFORMIN (GLUCOPHAGE) 500 MG tablet TAKE ONE TABLET BY MOUTH TWICE A DAY WITH MEALS  . methylPREDNISolone (MEDROL) 4 MG tablet Take 4 mg by mouth daily.  . metoprolol succinate (TOPROL-XL) 25 MG 24 hr tablet Take 1 tablet (25 mg total) by mouth daily.  . Multiple Vitamin tablet Take 1 tablet by mouth every evening. Reported on 03/13/2015  . simvastatin (ZOCOR) 20 MG tablet TAKE ONE TABLET BY MOUTH EVERY NIGHT AT BEDTIME  . [DISCONTINUED] apixaban (ELIQUIS) 5 MG TABS tablet Take 1 tablet (5 mg total) by mouth 2 (two) times daily.    Allergies: Morphine sulfate and Penicillins  Social History   Tobacco Use  . Smoking status: Former Smoker    Quit date: 03/09/1978    Years since quitting: 41.1  . Smokeless tobacco: Never Used  Substance Use Topics  . Alcohol use: Yes    Alcohol/week: 0.0 - 1.0 standard drinks  . Drug use: No    Family History  Problem Relation Age of Onset  . Hypertension Mother   . Diabetes Mother   . Lung cancer Father     Review of Systems: A 12-system review of systems was performed and was negative except as noted in the HPI.  --------------------------------------------------------------------------------------------------  Physical Exam: BP 128/68 (BP Location: Left Arm, Patient Position: Sitting, Cuff Size: Normal)   Pulse 69   Ht 5' 1.5" (1.562 m)   Wt 164 lb (74.4 kg)   SpO2 98%   BMI 30.49 kg/m   General:  NAD.  Accompanied by her daughter. HEENT: No conjunctival pallor or scleral icterus. Facemask in place. Neck: Supple without lymphadenopathy, thyromegaly, JVD, or HJR. Lungs:  Normal work of breathing. Clear to auscultation bilaterally without wheezes or crackles. Heart: Regular rate and rhythm without murmurs, rubs, or gallops. Abd: Bowel sounds present. Soft, NT/ND without hepatosplenomegaly Ext: No lower extremity edema. 2+ radial pulses.  EKG:  NSR with sinus arrhythmia versus PAC's  Low voltage.  Otherwise, no significant abnormality.  Lab Results  Component Value Date   WBC 6.1 12/21/2018   HGB 13.3 12/21/2018   HCT 40.0 12/21/2018   MCV 92 12/21/2018   PLT 270 12/21/2018    Lab Results  Component Value Date   NA 140 12/21/2018   K 4.3 12/21/2018   CL 103 12/21/2018   CO2 22 12/21/2018   BUN 23 12/21/2018   CREATININE 0.88 12/21/2018   GLUCOSE 138 (H) 12/21/2018   ALT 30 12/21/2018    Lab Results  Component Value Date   CHOL 149 12/21/2018   HDL 52 12/21/2018   LDLCALC 69 12/21/2018   TRIG 163 (H) 12/21/2018   CHOLHDL 2.9 12/21/2018    --------------------------------------------------------------------------------------------------  ASSESSMENT AND PLAN: Syncope: Recent fall appears to have been mechanical in nature.  Subsequent transient loss of consciousness/unresponsiveness reported by [name]'s daughter is difficult to characterize.  It may have been transient vagal event in the setting fall and associated left shoulder trauma.  EMS evaluation at the time  was reportedly unremarkable other than findings suggestive of dehydration.  EKG and exam today are unremarkable.  Prior workup syncope in 2017 was unrevealing, though PAF was subsequently noted.  Circumstances do not sound consistent with a primary arrhythmogenic event.  I think it would be reasonable to obtain an echocardiogram to ensure that new structural abnormalities have not developed, particularly with upcoming left his surgery.  If this is without significant abnormalities, I favor deferring additional cardiac workup at this time.  I encouraged [name] to remain  well-hydrated and advised her to refrain from driving for 6 months from the time of her most recent syncopal event.  Paroxysmal atrial fibrillation: No symptoms to suggest recurrence.  EKG today shows NSR.  [name] daughter reports that sinus rhythm was also noted at the time of recent fall.  While offset pause could precipitate transient loss of consciousness, though it seems very unlikely that [name] would have developed transient a-fib at exactly the moment that she suffered a mechanical fall.  We defer additional cardiac monitoring unless she develops symptoms or has recurrent syncope.  We will continue with apixaban and metoprolol succinate, though if falls become a recurrent issue, safety of long-term anticoagulation will need to be reassessed.  Hypertension: BP is well-controlled today.  [name] should continue her current regimen of metoprolol and lisinopril.  Preop evaluation: Other than recent fall with reported transient loss of consciousness, [name] has been asymptomatic from a cardiac standpoint (though her mobility is limited by hip pain).  Exam and EKG today are unremarkable.  We will obtain an echocardiogram, as outlined above.  If this does not reveal any significant abnormalities, I think it is reasonable for [name] to proceed with elective hip surgery, which is a low-risk procedure, without additional testing/intervention.  Apixaban should be held 2-3 days prior to the procedure and restarted when felt safe to do so from by Dr. Rudene Christians.  Follow-up: Return to clinic in 6 months.  Nelva Bush, MD 04/15/2019 7:47 AM

## 2019-04-21 ENCOUNTER — Other Ambulatory Visit: Payer: Self-pay

## 2019-04-21 ENCOUNTER — Ambulatory Visit (INDEPENDENT_AMBULATORY_CARE_PROVIDER_SITE_OTHER): Payer: Medicare Other

## 2019-04-21 DIAGNOSIS — R55 Syncope and collapse: Secondary | ICD-10-CM

## 2019-04-22 ENCOUNTER — Telehealth: Payer: Self-pay | Admitting: Internal Medicine

## 2019-04-22 NOTE — Telephone Encounter (Signed)
Patient calling in asking about echo results from 2/11. Patient will be out getting second covid shot this afternoon and said a voicemail will work if she does not answer

## 2019-04-22 NOTE — Telephone Encounter (Signed)
No answer. Left message that echo results not ready yet but will call when they are.

## 2019-04-26 ENCOUNTER — Encounter
Admission: RE | Admit: 2019-04-26 | Discharge: 2019-04-26 | Disposition: A | Discharge status: Home / Self Care | Payer: Medicare Other | Source: Ambulatory Visit | Attending: Orthopedic Surgery | Admitting: Orthopedic Surgery

## 2019-04-26 ENCOUNTER — Other Ambulatory Visit: Payer: Self-pay

## 2019-04-26 DIAGNOSIS — Z01812 Encounter for preprocedural laboratory examination: Secondary | ICD-10-CM | POA: Insufficient documentation

## 2019-04-26 NOTE — Patient Instructions (Signed)
Your procedure is scheduled on: Tuesday 05/03/19.  Report to DAY SURGERY DEPARTMENT LOCATED ON 2ND FLOOR MEDICAL MALL ENTRANCE. To find out your arrival time please call [phone] between 1PM - 3PM on Monday 05/02/19.   Remember: Instructions that are not followed completely may result in serious medical risk, up to and including death, or upon the discretion of your surgeon and anesthesiologist your surgery may need to be rescheduled.      _X__ 1. Do not eat food after midnight the night before your procedure.                 No gum chewing or hard candies. You may drink SUGAR FREE clear liquids up to 2 hours                 before you are scheduled to arrive for your surgery- DO NOT drink clear                 liquids within 2 hours of the start of your surgery.                   ** Dr. Rudene Christians would like for you to drink the G2 Gatorade on the morning of surgery. Please finish 2 hours prior to arrival time. **   __X__ 2.  On the morning of surgery brush your teeth with toothpaste and water, you may rinse your mouth with mouthwash if you wish.  Do not swallow any toothpaste or mouthwash.       __X__3.  No Alcohol for 24 hours before or after surgery.    __X__4.  Notify your doctor if there is any change in your medical condition      (cold, fever, infections).      Do not wear jewelry, make-up, hairpins, clips or nail polish. Do not wear lotions, powders, or perfumes.  Do not shave 48 hours prior to surgery. Men may shave face and neck. Do not bring valuables to the hospital.      [facility] is not responsible for any belongings or valuables.   Contacts, dentures/partials or body piercings may not be worn into surgery. Bring a case for your contacts, glasses or hearing aids, a denture cup will be supplied.    Patients discharged the day of surgery will not be allowed to drive home.     __X__ Take these medicines the morning of surgery with A SIP OF WATER:     1.  metoprolol succinate (TOPROL-XL)   2. acetaminophen (TYLENOL) If needed    __X__ Use CHG Soap as directed   __X__ Stop Metformin 2 days prior to your procedure. Your last dose will be on Saturday evening 04/30/19.   __X__ Stop Blood Thinners:  apixaban (ELIQUIS) as previously instructed. You reported being instructed to stop taking this on Saturday 04/30/19.   __X__ Stop Anti-inflammatories 7 days before surgery such as Advil, Ibuprofen, Motrin, BC or Goodies Powder, Naprosyn, Naproxen, Aleve, Aspirin, Meloxicam. May take Tylenol if needed for pain or discomfort.    __X__  Don't start taking any new herbal supplements prior to your procedure.

## 2019-04-29 ENCOUNTER — Other Ambulatory Visit
Admission: RE | Admit: 2019-04-29 | Discharge: 2019-04-29 | Disposition: A | Discharge status: Home / Self Care | Payer: Medicare Other | Source: Ambulatory Visit | Attending: Orthopedic Surgery | Admitting: Orthopedic Surgery

## 2019-04-29 DIAGNOSIS — Z20822 Contact with and (suspected) exposure to covid-19: Secondary | ICD-10-CM | POA: Insufficient documentation

## 2019-04-29 DIAGNOSIS — Z01812 Encounter for preprocedural laboratory examination: Secondary | ICD-10-CM | POA: Insufficient documentation

## 2019-04-29 LAB — BASIC METABOLIC PANEL
Anion gap: 8 (ref 5–15)
BUN: 25 mg/dL — ABNORMAL HIGH (ref 8–23)
CO2: 26 mmol/L (ref 22–32)
Calcium: 9.3 mg/dL (ref 8.9–10.3)
Chloride: 105 mmol/L (ref 98–111)
Creatinine, Ser: 0.81 mg/dL (ref 0.44–1.00)
GFR calc Af Amer: >60 mL/min (ref >60)
GFR calc non Af Amer: >60 mL/min (ref >60)
Glucose, Bld: 169 mg/dL — ABNORMAL HIGH (ref 70–99)
Potassium: 4.4 mmol/L (ref 3.5–5.1)
Sodium: 139 mmol/L (ref 135–145)

## 2019-04-29 LAB — CBC
HCT: 39.3 % (ref 36.0–46.0)
Hemoglobin: 12.8 g/dL (ref 12.0–15.0)
MCH: 29.8 pg (ref 26.0–34.0)
MCHC: 32.6 g/dL (ref 30.0–36.0)
MCV: 91.4 fL (ref 80.0–100.0)
Platelets: 239 10*3/uL (ref 150–400)
RBC: 4.3 MIL/uL (ref 3.87–5.11)
RDW: 11.9 % (ref 11.5–15.5)
WBC: 6.7 10*3/uL (ref 4.0–10.5)
nRBC: 0 % (ref 0.0–0.2)

## 2019-04-29 LAB — SARS CORONAVIRUS 2 (TAT 6-24 HRS): SARS Coronavirus 2: NEGATIVE

## 2019-05-02 MED ORDER — CLINDAMYCIN PHOSPHATE 900 MG/50ML IV SOLN
900.0000 mg | INTRAVENOUS | Status: AC
  Administered 2019-05-03: 900 mg via INTRAVENOUS

## 2019-05-03 ENCOUNTER — Ambulatory Visit: Payer: Medicare Other | Admitting: Anesthesiology

## 2019-05-03 ENCOUNTER — Encounter
Admission: RE | Disposition: A | Discharge status: Home / Self Care | Payer: Self-pay | Source: Home / Self Care | Attending: Orthopedic Surgery

## 2019-05-03 ENCOUNTER — Encounter: Payer: Self-pay | Admitting: Orthopedic Surgery

## 2019-05-03 ENCOUNTER — Other Ambulatory Visit: Payer: Self-pay

## 2019-05-03 ENCOUNTER — Ambulatory Visit
Admission: RE | Admit: 2019-05-03 | Discharge: 2019-05-03 | Disposition: A | Discharge status: Home / Self Care | Payer: Medicare Other | Attending: Orthopedic Surgery | Admitting: Orthopedic Surgery

## 2019-05-03 DIAGNOSIS — Z7901 Long term (current) use of anticoagulants: Secondary | ICD-10-CM | POA: Insufficient documentation

## 2019-05-03 DIAGNOSIS — Z87891 Personal history of nicotine dependence: Secondary | ICD-10-CM | POA: Diagnosis not present

## 2019-05-03 DIAGNOSIS — E1151 Type 2 diabetes mellitus with diabetic peripheral angiopathy without gangrene: Secondary | ICD-10-CM | POA: Diagnosis not present

## 2019-05-03 DIAGNOSIS — Z833 Family history of diabetes mellitus: Secondary | ICD-10-CM | POA: Insufficient documentation

## 2019-05-03 DIAGNOSIS — Z79899 Other long term (current) drug therapy: Secondary | ICD-10-CM | POA: Insufficient documentation

## 2019-05-03 DIAGNOSIS — Z885 Allergy status to narcotic agent status: Secondary | ICD-10-CM | POA: Insufficient documentation

## 2019-05-03 DIAGNOSIS — I10 Essential (primary) hypertension: Secondary | ICD-10-CM | POA: Insufficient documentation

## 2019-05-03 DIAGNOSIS — Z882 Allergy status to sulfonamides status: Secondary | ICD-10-CM | POA: Insufficient documentation

## 2019-05-03 DIAGNOSIS — Z7984 Long term (current) use of oral hypoglycemic drugs: Secondary | ICD-10-CM | POA: Insufficient documentation

## 2019-05-03 DIAGNOSIS — I480 Paroxysmal atrial fibrillation: Secondary | ICD-10-CM | POA: Insufficient documentation

## 2019-05-03 DIAGNOSIS — X58XXXA Exposure to other specified factors, initial encounter: Secondary | ICD-10-CM | POA: Diagnosis not present

## 2019-05-03 DIAGNOSIS — Z880 Allergy status to penicillin: Secondary | ICD-10-CM | POA: Diagnosis not present

## 2019-05-03 DIAGNOSIS — S76012A Strain of muscle, fascia and tendon of left hip, initial encounter: Secondary | ICD-10-CM | POA: Insufficient documentation

## 2019-05-03 DIAGNOSIS — E782 Mixed hyperlipidemia: Secondary | ICD-10-CM | POA: Diagnosis not present

## 2019-05-03 HISTORY — PX: EXCISION/RELEASE BURSA HIP: SHX5014

## 2019-05-03 LAB — GLUCOSE, CAPILLARY
Glucose-Capillary: 134 mg/dL — ABNORMAL HIGH (ref 70–99)
Glucose-Capillary: 138 mg/dL — ABNORMAL HIGH (ref 70–99)

## 2019-05-03 SURGERY — RELEASE, BURSA, TROCHANTERIC
Anesthesia: Spinal | Site: Hip | Laterality: Left

## 2019-05-03 MED ORDER — HYDROCODONE-ACETAMINOPHEN 5-325 MG PO TABS
ORAL_TABLET | ORAL | Status: AC
  Filled 2019-05-03: qty 1

## 2019-05-03 MED ORDER — PROPOFOL 500 MG/50ML IV EMUL
INTRAVENOUS | Status: DC | PRN
  Administered 2019-05-03: 50 ug/kg/min via INTRAVENOUS

## 2019-05-03 MED ORDER — CHLORHEXIDINE GLUCONATE 4 % EX LIQD: 60.0000 mL | Freq: Once | CUTANEOUS | Status: DC

## 2019-05-03 MED ORDER — CLINDAMYCIN PHOSPHATE 900 MG/50ML IV SOLN
INTRAVENOUS | Status: AC
  Filled 2019-05-03: qty 50

## 2019-05-03 MED ORDER — HYDROCODONE-ACETAMINOPHEN 5-325 MG PO TABS: 1.0000 | ORAL_TABLET | Freq: Four times a day (QID) | ORAL | 0 refills | Status: DC | PRN

## 2019-05-03 MED ORDER — BUPIVACAINE-EPINEPHRINE (PF) 0.5% -1:200000 IJ SOLN
INTRAMUSCULAR | Status: AC
  Filled 2019-05-03: qty 30

## 2019-05-03 MED ORDER — FENTANYL CITRATE (PF) 100 MCG/2ML IJ SOLN
INTRAMUSCULAR | Status: AC
  Filled 2019-05-03: qty 2

## 2019-05-03 MED ORDER — SODIUM CHLORIDE 0.9 % IR SOLN
Status: DC | PRN
  Administered 2019-05-03: 10:00:00 1000 mL

## 2019-05-03 MED ORDER — PROPOFOL 10 MG/ML IV BOLUS
INTRAVENOUS | Status: DC | PRN
  Administered 2019-05-03: 20 mg via INTRAVENOUS

## 2019-05-03 MED ORDER — FENTANYL CITRATE (PF) 100 MCG/2ML IJ SOLN
INTRAMUSCULAR | Status: DC | PRN
  Administered 2019-05-03: 50 ug via INTRAVENOUS

## 2019-05-03 MED ORDER — FAMOTIDINE 20 MG PO TABS: 20.0000 mg | ORAL_TABLET | Freq: Once | ORAL | Status: AC

## 2019-05-03 MED ORDER — SODIUM CHLORIDE 0.9 % IV SOLN
INTRAVENOUS | Status: DC | PRN
  Administered 2019-05-03: 50 ug/min via INTRAVENOUS

## 2019-05-03 MED ORDER — BUPIVACAINE HCL (PF) 0.5 % IJ SOLN
INTRAMUSCULAR | Status: DC | PRN
  Administered 2019-05-03: 2.5 mL

## 2019-05-03 MED ORDER — HYDROCODONE-ACETAMINOPHEN 5-325 MG PO TABS
1.0000 | ORAL_TABLET | Freq: Once | ORAL | Status: AC
  Administered 2019-05-03: 16:00:00 1 via ORAL

## 2019-05-03 MED ORDER — PHENYLEPHRINE HCL (PRESSORS) 10 MG/ML IV SOLN
INTRAVENOUS | Status: DC | PRN
  Administered 2019-05-03: 100 ug via INTRAVENOUS

## 2019-05-03 MED ORDER — NEOMYCIN-POLYMYXIN B GU 40-200000 IR SOLN
Status: AC
  Filled 2019-05-03: qty 4

## 2019-05-03 MED ORDER — GLYCOPYRROLATE 0.2 MG/ML IJ SOLN
INTRAMUSCULAR | Status: DC | PRN
  Administered 2019-05-03: .2 mg via INTRAVENOUS

## 2019-05-03 MED ORDER — FAMOTIDINE 20 MG PO TABS
ORAL_TABLET | ORAL | Status: AC
  Administered 2019-05-03: 09:00:00 20 mg via ORAL
  Filled 2019-05-03: qty 1

## 2019-05-03 MED ORDER — PROPOFOL 10 MG/ML IV BOLUS
INTRAVENOUS | Status: AC
  Filled 2019-05-03: qty 20

## 2019-05-03 MED ORDER — FENTANYL CITRATE (PF) 100 MCG/2ML IJ SOLN: 25.0000 ug | INTRAMUSCULAR | Status: DC | PRN

## 2019-05-03 MED ORDER — SODIUM CHLORIDE 0.9 % IV SOLN: INTRAVENOUS | Status: DC

## 2019-05-03 SURGICAL SUPPLY — 36 items
ANCHOR JUGGERKNOT WTAP NDL 2.9 (Anchor) ×4 IMPLANT
BIT DRILL JUGRKNT W/NDL BIT2.9 (DRILL) IMPLANT
BNDG COHESIVE 6X5 TAN STRL LF (GAUZE/BANDAGES/DRESSINGS) ×3 IMPLANT
CANISTER SUCT 1200ML W/VALVE (MISCELLANEOUS) ×3 IMPLANT
CANISTER SUCT 3000ML PPV (MISCELLANEOUS) ×3 IMPLANT
CHLORAPREP W/TINT 26 (MISCELLANEOUS) ×3 IMPLANT
COVER WAND RF STERILE (DRAPES) ×3 IMPLANT
DRAPE 3/4 80X56 (DRAPES) ×3 IMPLANT
DRAPE INCISE IOBAN 66X60 STRL (DRAPES) ×3 IMPLANT
DRILL JUGGERKNOT W/NDL BIT 2.9 (DRILL) ×3
DRSG OPSITE POSTOP 4X10 (GAUZE/BANDAGES/DRESSINGS) ×2 IMPLANT
ELECT CAUTERY BLADE 6.4 (BLADE) ×3 IMPLANT
ELECT REM PT RETURN 9FT ADLT (ELECTROSURGICAL) ×3
ELECTRODE REM PT RTRN 9FT ADLT (ELECTROSURGICAL) ×1 IMPLANT
GAUZE SPONGE 4X4 12PLY STRL (GAUZE/BANDAGES/DRESSINGS) ×3 IMPLANT
GAUZE XEROFORM 1X8 LF (GAUZE/BANDAGES/DRESSINGS) ×5 IMPLANT
GLOVE BIOGEL PI IND STRL 9 (GLOVE) ×1 IMPLANT
GLOVE BIOGEL PI INDICATOR 9 (GLOVE) ×2
GLOVE SURG SYN 9.0  PF PI (GLOVE) ×2
GLOVE SURG SYN 9.0 PF PI (GLOVE) ×1 IMPLANT
GOWN SRG 2XL LVL 4 RGLN SLV (GOWNS) ×1 IMPLANT
GOWN STRL NON-REIN 2XL LVL4 (GOWNS) ×2
GOWN STRL REUS W/ TWL LRG LVL3 (GOWN DISPOSABLE) ×1 IMPLANT
GOWN STRL REUS W/TWL LRG LVL3 (GOWN DISPOSABLE) ×2
KIT TURNOVER KIT A (KITS) ×3 IMPLANT
NDL FILTER BLUNT 18X1 1/2 (NEEDLE) ×1 IMPLANT
NEEDLE FILTER BLUNT 18X 1/2SAF (NEEDLE) ×2
NEEDLE FILTER BLUNT 18X1 1/2 (NEEDLE) ×1 IMPLANT
NS IRRIG 1000ML POUR BTL (IV SOLUTION) ×3 IMPLANT
PACK HIP PROSTHESIS (MISCELLANEOUS) ×3 IMPLANT
STAPLER SKIN PROX 35W (STAPLE) ×3 IMPLANT
SUT VIC AB 0 CT1 27 (SUTURE) ×2
SUT VIC AB 0 CT1 27XCR 8 STRN (SUTURE) ×1 IMPLANT
SUT VIC AB 2-0 CT1 27 (SUTURE) ×2
SUT VIC AB 2-0 CT1 TAPERPNT 27 (SUTURE) ×1 IMPLANT
SYR 10ML LL (SYRINGE) ×3 IMPLANT

## 2019-05-03 NOTE — Anesthesia Procedure Notes (Signed)
Spinal  Patient location during procedure: OR Start time: 05/03/2019 9:40 AM End time: 05/03/2019 9:51 AM Staffing Performed: anesthesiologist  Anesthesiologist: Tera Mater, MD Preanesthetic Checklist Completed: patient identified, IV checked, site marked, risks and benefits discussed, surgical consent, monitors and equipment checked, pre-op evaluation and timeout performed Spinal Block Patient position: sitting Prep: Betadine Patient monitoring: heart rate, continuous pulse ox, blood pressure and cardiac monitor Approach: midline Location: L3-4 Injection technique: single-shot Needle Needle type: Whitacre and Introducer  Needle gauge: 25 G Needle length: 9 cm Additional Notes Negative paresthesia. Negative blood return. Positive free-flowing CSF. Expiration date of kit checked and confirmed. Patient tolerated procedure well, without complications.

## 2019-05-03 NOTE — Op Note (Signed)
05/03/2019  11:01 AM  PATIENT:  [name]  86 y.o. female  PRE-OPERATIVE DIAGNOSIS:  TENDINOPATHY OF LEFT GLUTEUS MEDIAS  POST-OPERATIVE DIAGNOSIS:  TENDINOPATHY OF LEFT GLUTEUS MEDIAS  PROCEDURE:  Procedure(s): HIP ABDUCTOR REPAIR (Left)  SURGEON: Laurene Footman, MD  ASSISTANTS: None  ANESTHESIA:   spinal  EBL:  Total I/O In: 600 [I.V.:600] Out: 20 [Blood:20]  BLOOD ADMINISTERED:none  DRAINS: none   LOCAL MEDICATIONS USED:  MARCAINE     SPECIMEN:  No Specimen  DISPOSITION OF SPECIMEN:  N/A  COUNTS:  YES  TOURNIQUET:  * No tourniquets in log *  IMPLANTS: Biomet juggernaut soft anchors x2  DICTATION: .Dragon Dictation patient was brought to the operating room and after spinal anesthesia was obtained patient was placed in the lateral decubitus position.  The left hip was prepped and draped in the usual sterile fashion.  After patient identification and timeout procedures were completed incision was made directly over the greater trochanter and the subcutaneous tissue divided to expose the tensor.  This was split and there is extensive bursa present which was removed.  There is also irregular bumpy surface to the lateral trochanter which was also debrided with use of nausea.  The leg was placed on a padded Mayo stand and with the leg externally rotated there is a fair area where the gluteus medius and minimus tendons attached.  This was debrided with use of a Roger to get a roughened surface for subsequent repair.  Anterior the abductors had retracted and were adherent to the overlying gluteus muscle.  After separating them they could be brought down separately and 2 suture anchors were placed on repairing tenderness for cementing medius medius with juggernaut anchor placed and 2 sutures on this anchor woven through the tendon and bring the tendon back down to the bone.  After the repair and all sutures have been placed the hip was brought down to the side and the repair was  stable.  The wound was thoroughly irrigated and 30 cc of half percent Sensorcaine with epinephrine was infiltrated in the adjacent tissues.  The IT band was repaired using a running 0 Vicryl suture followed by 2-0 Vicryl subcutaneously and skin staples.  Xeroform and honeycomb dressing applied.  PLAN OF CARE: Discharge to home after PACU  PATIENT DISPOSITION:  PACU - hemodynamically stable.

## 2019-05-03 NOTE — Progress Notes (Signed)
Patient moving all extremities, bends knees wnl.  Patient feels like she wants to get up and use the bathroom to void.

## 2019-05-03 NOTE — Anesthesia Preprocedure Evaluation (Addendum)
Anesthesia Evaluation  Patient identified by MRN, date of birth, ID band Patient awake    Reviewed: Allergy & Precautions, H&P , NPO status , Patient's Chart, lab work & pertinent test results  Airway Mallampati: II  TM Distance: >3 FB Neck ROM: full    Dental  (+) Chipped   Pulmonary former smoker,           Cardiovascular hypertension, + Peripheral Vascular Disease  + dysrhythmias (on Eliquis) Atrial Fibrillation   H/o recurrent syncope thought to be vasovagal in nature.  Seen by Cardiology, recommended echo which was WNL apart from grade 1 diastolic dysfunction, no further workup recommended at this time, cleared for surgery.  Echo 04/20/18: 1. Left ventricular ejection fraction, by estimation, is 55 to 60%. The  left ventricle has normal function. The left ventricle has no regional  wall motion abnormalities. Left ventricular diastolic parameters are  consistent with Grade I diastolic  dysfunction (impaired relaxation).  2. Right ventricular systolic function is normal. The right ventricular  size is normal.    Neuro/Psych negative neurological ROS  negative psych ROS   GI/Hepatic negative GI ROS, Neg liver ROS,   Endo/Other  diabetes  Renal/GU      Musculoskeletal   Abdominal   Peds  Hematology negative hematology ROS (+)   Anesthesia Other Findings Past Medical History: No date: Chronic diarrhea No date: Collagenous colitis No date: Diabetes mellitus without complication (HCC) No date: Diverticulitis No date: Heart murmur     Comment:  at birth, none since No date: History of echocardiogram     Comment:  a. 11/2015: echo showing EF of 60-65% with no WMA. Grade              1 DD and mild MR noted.  No date: Hypertension No date: PAF (paroxysmal atrial fibrillation) ([place])     Comment:  a. initially diagnosed in 08/2016 --> started on Eliquis No date: Syncope     Comment:  a. initially occurring in  Fall 2016 b. Hospitalized in               10/2015 and 11/2015 for recurrent episodes.   Past Surgical History: No date: ABDOMINAL HYSTERECTOMY 08/08/2008: BACK SURGERY     Comment:  Lumbar discectomy No date: COLONOSCOPY WITH PROPOFOL 1980: NECK SURGERY     Reproductive/Obstetrics negative OB ROS                            Anesthesia Physical Anesthesia Plan  ASA: III  Anesthesia Plan: Spinal   Post-op Pain Management:    Induction:   PONV Risk Score and Plan: Propofol infusion  Airway Management Planned: Natural Airway and Simple Face Mask  Additional Equipment:   Intra-op Plan:   Post-operative Plan:   Informed Consent: I have reviewed the patients History and Physical, chart, labs and discussed the procedure including the risks, benefits and alternatives for the proposed anesthesia with the patient or authorized representative who has indicated his/her understanding and acceptance.     Dental Advisory Given  Plan Discussed with: Anesthesiologist  Anesthesia Plan Comments: (Last dose Eliquis 4 days ago)       Anesthesia Quick Evaluation

## 2019-05-03 NOTE — H&P (Signed)
Reviewed paper H+P, will be scanned into chart. No changes noted.  

## 2019-05-03 NOTE — Transfer of Care (Signed)
Immediate Anesthesia Transfer of Care Note  [name]  Procedure(s) Performed: HIP ABDUCTOR REPAIR (Left Hip)  Patient Location: PACU  Anesthesia Type:Spinal  Level of Consciousness: awake, alert  and oriented  Airway & Oxygen Therapy: Patient Spontanous Breathing  Post-op Assessment: Report given to RN and Post -op Vital signs reviewed and stable  Post vital signs: Reviewed and stable  Last Vitals:  Vitals Value Taken Time  BP    Temp    Pulse 66 05/03/19 1057  Resp    SpO2 100 % 05/03/19 1057  Vitals shown include unvalidated device data.  Last Pain:  Vitals:   05/03/19 0846  TempSrc: Temporal  PainSc: 0-No pain         Complications: No apparent anesthesia complications

## 2019-05-03 NOTE — Discharge Instructions (Addendum)
Pain medicine as directed.  Weightbearing as tolerated on left side.  Ice to the side of the hip today and tomorrow to help with pain and swelling.  Reinforce dressing as needed.  Call office if you are having problems.    AMBULATORY SURGERY  DISCHARGE INSTRUCTIONS   1) The drugs that you were given will stay in your system until tomorrow so for the next 24 hours you should not:  A) Drive an automobile B) Make any legal decisions C) Drink any alcoholic beverage   2) You may resume regular meals tomorrow.  Today it is better to start with liquids and gradually work up to solid foods.  You may eat anything you prefer, but it is better to start with liquids, then soup and crackers, and gradually work up to solid foods.   3) Please notify your doctor immediately if you have any unusual bleeding, trouble breathing, redness and pain at the surgery site, drainage, fever, or pain not relieved by medication.    4) Additional Instructions:        Please contact your physician with any problems or Same Day Surgery at [phone], Monday through Friday 6 am to 4 pm, or [place] at [facility] number at [phone].

## 2019-05-03 NOTE — Progress Notes (Signed)
Patient assisted to bedside commode to void.  Patient stood well and wanted some privacy to void, states it takes her a little bit at home to be able to use the bathroom.

## 2019-05-05 NOTE — Addendum Note (Signed)
Addendum  created 05/05/19 1045 by Nelda Marseille, CRNA   Intraprocedure Event edited

## 2019-05-05 NOTE — Anesthesia Postprocedure Evaluation (Signed)
Anesthesia Post Note  [name]  Procedure(s) Performed: HIP ABDUCTOR REPAIR (Left Hip)  Patient location during evaluation: PACU Anesthesia Type: Spinal Level of consciousness: oriented and awake and alert Pain management: pain level controlled Vital Signs Assessment: post-procedure vital signs reviewed and stable Respiratory status: spontaneous breathing and respiratory function stable Cardiovascular status: blood pressure returned to baseline and stable Postop Assessment: no headache, no backache, no apparent nausea or vomiting and spinal receding Anesthetic complications: no     Last Vitals:  Vitals:   05/03/19 1300 05/03/19 1352  BP: 138/86 (!) 176/65  Pulse: 70 73  Resp: 12 16  Temp:  36.6 C  SpO2: 98% 100%    Last Pain:  Vitals:   05/04/19 0818  TempSrc:   PainSc: [place]

## 2019-06-11 ENCOUNTER — Other Ambulatory Visit: Payer: Self-pay

## 2019-06-11 ENCOUNTER — Inpatient Hospital Stay
Admission: EM | Admit: 2019-06-11 | Discharge: 2019-06-16 | DRG: 482 | Disposition: A | Discharge status: Skilled Nursing Facility | Payer: Medicare Other | Attending: Internal Medicine | Admitting: Internal Medicine

## 2019-06-11 ENCOUNTER — Encounter: Payer: Self-pay | Admitting: Emergency Medicine

## 2019-06-11 ENCOUNTER — Emergency Department: Payer: Medicare Other

## 2019-06-11 DIAGNOSIS — I480 Paroxysmal atrial fibrillation: Secondary | ICD-10-CM

## 2019-06-11 DIAGNOSIS — W010XXA Fall on same level from slipping, tripping and stumbling without subsequent striking against object, initial encounter: Secondary | ICD-10-CM | POA: Diagnosis present

## 2019-06-11 DIAGNOSIS — R079 Chest pain, unspecified: Secondary | ICD-10-CM

## 2019-06-11 DIAGNOSIS — K5903 Drug induced constipation: Secondary | ICD-10-CM | POA: Diagnosis not present

## 2019-06-11 DIAGNOSIS — Z7901 Long term (current) use of anticoagulants: Secondary | ICD-10-CM

## 2019-06-11 DIAGNOSIS — S72141A Displaced intertrochanteric fracture of right femur, initial encounter for closed fracture: Secondary | ICD-10-CM

## 2019-06-11 DIAGNOSIS — Z833 Family history of diabetes mellitus: Secondary | ICD-10-CM

## 2019-06-11 DIAGNOSIS — Y92009 Unspecified place in unspecified non-institutional (private) residence as the place of occurrence of the external cause: Secondary | ICD-10-CM | POA: Diagnosis not present

## 2019-06-11 DIAGNOSIS — W19XXXA Unspecified fall, initial encounter: Secondary | ICD-10-CM | POA: Diagnosis not present

## 2019-06-11 DIAGNOSIS — R296 Repeated falls: Secondary | ICD-10-CM | POA: Diagnosis present

## 2019-06-11 DIAGNOSIS — R0789 Other chest pain: Secondary | ICD-10-CM | POA: Diagnosis not present

## 2019-06-11 DIAGNOSIS — Z419 Encounter for procedure for purposes other than remedying health state, unspecified: Secondary | ICD-10-CM

## 2019-06-11 DIAGNOSIS — Z801 Family history of malignant neoplasm of trachea, bronchus and lung: Secondary | ICD-10-CM

## 2019-06-11 DIAGNOSIS — I10 Essential (primary) hypertension: Secondary | ICD-10-CM | POA: Diagnosis present

## 2019-06-11 DIAGNOSIS — E1100 Type 2 diabetes mellitus with hyperosmolarity without nonketotic hyperglycemic-hyperosmolar coma (NKHHC): Secondary | ICD-10-CM

## 2019-06-11 DIAGNOSIS — Z87891 Personal history of nicotine dependence: Secondary | ICD-10-CM | POA: Diagnosis not present

## 2019-06-11 DIAGNOSIS — Z880 Allergy status to penicillin: Secondary | ICD-10-CM | POA: Diagnosis not present

## 2019-06-11 DIAGNOSIS — Z96642 Presence of left artificial hip joint: Secondary | ICD-10-CM | POA: Diagnosis present

## 2019-06-11 DIAGNOSIS — E119 Type 2 diabetes mellitus without complications: Secondary | ICD-10-CM | POA: Diagnosis present

## 2019-06-11 DIAGNOSIS — Z20822 Contact with and (suspected) exposure to covid-19: Secondary | ICD-10-CM | POA: Diagnosis present

## 2019-06-11 DIAGNOSIS — T402X5A Adverse effect of other opioids, initial encounter: Secondary | ICD-10-CM | POA: Diagnosis not present

## 2019-06-11 DIAGNOSIS — E785 Hyperlipidemia, unspecified: Secondary | ICD-10-CM | POA: Diagnosis present

## 2019-06-11 DIAGNOSIS — M25551 Pain in right hip: Secondary | ICD-10-CM

## 2019-06-11 DIAGNOSIS — S72001A Fracture of unspecified part of neck of right femur, initial encounter for closed fracture: Secondary | ICD-10-CM | POA: Diagnosis not present

## 2019-06-11 DIAGNOSIS — Z7984 Long term (current) use of oral hypoglycemic drugs: Secondary | ICD-10-CM

## 2019-06-11 DIAGNOSIS — S72144A Nondisplaced intertrochanteric fracture of right femur, initial encounter for closed fracture: Secondary | ICD-10-CM

## 2019-06-11 DIAGNOSIS — I152 Hypertension secondary to endocrine disorders: Secondary | ICD-10-CM | POA: Diagnosis present

## 2019-06-11 DIAGNOSIS — E1159 Type 2 diabetes mellitus with other circulatory complications: Secondary | ICD-10-CM

## 2019-06-11 DIAGNOSIS — Z8249 Family history of ischemic heart disease and other diseases of the circulatory system: Secondary | ICD-10-CM

## 2019-06-11 DIAGNOSIS — S7291XA Unspecified fracture of right femur, initial encounter for closed fracture: Secondary | ICD-10-CM | POA: Diagnosis present

## 2019-06-11 DIAGNOSIS — S72144D Nondisplaced intertrochanteric fracture of right femur, subsequent encounter for closed fracture with routine healing: Secondary | ICD-10-CM | POA: Diagnosis not present

## 2019-06-11 HISTORY — DX: Displaced intertrochanteric fracture of right femur, initial encounter for closed fracture: S72.141A

## 2019-06-11 LAB — CBC
HCT: 36.7 % (ref 36.0–46.0)
Hemoglobin: 12.4 g/dL (ref 12.0–15.0)
MCH: 30.5 pg (ref 26.0–34.0)
MCHC: 33.8 g/dL (ref 30.0–36.0)
MCV: 90.4 fL (ref 80.0–100.0)
Platelets: 240 10*3/uL (ref 150–400)
RBC: 4.06 MIL/uL (ref 3.87–5.11)
RDW: 12.1 % (ref 11.5–15.5)
WBC: 9.1 10*3/uL (ref 4.0–10.5)
nRBC: 0 % (ref 0.0–0.2)

## 2019-06-11 LAB — HEMOGLOBIN A1C
Hgb A1c MFr Bld: 7 % — ABNORMAL HIGH (ref 4.8–5.6)
Mean Plasma Glucose: 154.2 mg/dL

## 2019-06-11 LAB — BASIC METABOLIC PANEL
Anion gap: 8 (ref 5–15)
Anion gap: 8 (ref 5–15)
BUN: 27 mg/dL — ABNORMAL HIGH (ref 8–23)
BUN: 25 mg/dL — ABNORMAL HIGH (ref 8–23)
CO2: 27 mmol/L (ref 22–32)
CO2: 25 mmol/L (ref 22–32)
Calcium: 9.3 mg/dL (ref 8.9–10.3)
Calcium: 9 mg/dL (ref 8.9–10.3)
Chloride: 107 mmol/L (ref 98–111)
Chloride: 108 mmol/L (ref 98–111)
Creatinine, Ser: 0.79 mg/dL (ref 0.44–1.00)
Creatinine, Ser: 0.63 mg/dL (ref 0.44–1.00)
GFR calc Af Amer: >60 mL/min (ref >60)
GFR calc Af Amer: >60 mL/min (ref >60)
GFR calc non Af Amer: >60 mL/min (ref >60)
GFR calc non Af Amer: >60 mL/min (ref >60)
Glucose, Bld: 102 mg/dL — ABNORMAL HIGH (ref 70–99)
Glucose, Bld: 152 mg/dL — ABNORMAL HIGH (ref 70–99)
Potassium: 4.5 mmol/L (ref 3.5–5.1)
Potassium: 4.4 mmol/L (ref 3.5–5.1)
Sodium: 142 mmol/L (ref 135–145)
Sodium: 141 mmol/L (ref 135–145)

## 2019-06-11 LAB — RESPIRATORY PANEL BY RT PCR (FLU A&B, COVID)
Influenza A by PCR: NEGATIVE
Influenza B by PCR: NEGATIVE
SARS Coronavirus 2 by RT PCR: NEGATIVE

## 2019-06-11 LAB — PROTIME-INR
INR: 1.3 — ABNORMAL HIGH (ref 0.8–1.2)
Prothrombin Time: 15.6 seconds — ABNORMAL HIGH (ref 11.4–15.2)

## 2019-06-11 LAB — GLUCOSE, CAPILLARY: Glucose-Capillary: 124 mg/dL — ABNORMAL HIGH (ref 70–99)

## 2019-06-11 MED ORDER — ONDANSETRON HCL 4 MG/2ML IJ SOLN
4.0000 mg | Freq: Once | INTRAMUSCULAR | Status: AC
  Administered 2019-06-11: 4 mg via INTRAVENOUS
  Filled 2019-06-11: qty 2

## 2019-06-11 MED ORDER — INSULIN ASPART 100 UNIT/ML ~~LOC~~ SOLN
0.0000 [IU] | Freq: Every day | SUBCUTANEOUS | Status: DC
  Administered 2019-06-13: 21:00:00 3 [IU] via SUBCUTANEOUS
  Administered 2019-06-14: 22:00:00 2 [IU] via SUBCUTANEOUS
  Filled 2019-06-11 (×2): qty 1

## 2019-06-11 MED ORDER — INSULIN ASPART 100 UNIT/ML ~~LOC~~ SOLN
0.0000 [IU] | Freq: Three times a day (TID) | SUBCUTANEOUS | Status: DC
  Administered 2019-06-12: 2 [IU] via SUBCUTANEOUS
  Administered 2019-06-12: 17:00:00 3 [IU] via SUBCUTANEOUS
  Administered 2019-06-13: 5 [IU] via SUBCUTANEOUS
  Administered 2019-06-13 – 2019-06-14 (×2): 3 [IU] via SUBCUTANEOUS
  Administered 2019-06-14 (×2): 5 [IU] via SUBCUTANEOUS
  Administered 2019-06-15 – 2019-06-16 (×4): 3 [IU] via SUBCUTANEOUS
  Filled 2019-06-11 (×11): qty 1

## 2019-06-11 MED ORDER — FUROSEMIDE 20 MG PO TABS: 20.0000 mg | ORAL_TABLET | Freq: Every day | ORAL | Status: DC | PRN

## 2019-06-11 MED ORDER — FENTANYL CITRATE (PF) 100 MCG/2ML IJ SOLN
50.0000 ug | Freq: Once | INTRAMUSCULAR | Status: AC
  Administered 2019-06-11: 50 ug via INTRAVENOUS
  Filled 2019-06-11: qty 2

## 2019-06-11 MED ORDER — MORPHINE SULFATE (PF) 2 MG/ML IV SOLN
1.0000 mg | INTRAVENOUS | Status: DC | PRN
  Administered 2019-06-11: 21:00:00 2 mg via INTRAVENOUS
  Administered 2019-06-11: 1 mg via INTRAVENOUS
  Administered 2019-06-12 (×2): 2 mg via INTRAVENOUS
  Filled 2019-06-11 (×4): qty 1

## 2019-06-11 MED ORDER — ADULT MULTIVITAMIN W/MINERALS CH
1.0000 | ORAL_TABLET | Freq: Every day | ORAL | Status: DC
  Administered 2019-06-12 – 2019-06-16 (×4): 1 via ORAL
  Filled 2019-06-11 (×4): qty 1

## 2019-06-11 MED ORDER — LISINOPRIL 5 MG PO TABS
5.0000 mg | ORAL_TABLET | Freq: Every day | ORAL | Status: DC
  Administered 2019-06-11 – 2019-06-16 (×5): 5 mg via ORAL
  Filled 2019-06-11 (×5): qty 1

## 2019-06-11 MED ORDER — ONDANSETRON HCL 4 MG/2ML IJ SOLN
4.0000 mg | Freq: Four times a day (QID) | INTRAMUSCULAR | Status: DC | PRN
  Administered 2019-06-11: 18:00:00 4 mg via INTRAVENOUS
  Filled 2019-06-11: qty 2

## 2019-06-11 MED ORDER — METOPROLOL SUCCINATE ER 25 MG PO TB24
25.0000 mg | ORAL_TABLET | Freq: Every day | ORAL | Status: DC
  Administered 2019-06-11 – 2019-06-16 (×6): 25 mg via ORAL
  Filled 2019-06-11 (×6): qty 1

## 2019-06-11 MED ORDER — HYDROCODONE-ACETAMINOPHEN 5-325 MG PO TABS
1.0000 | ORAL_TABLET | Freq: Four times a day (QID) | ORAL | Status: DC | PRN
  Administered 2019-06-11 – 2019-06-14 (×8): 2 via ORAL
  Administered 2019-06-14: 1 via ORAL
  Administered 2019-06-15 – 2019-06-16 (×3): 2 via ORAL
  Filled 2019-06-11: qty 1
  Filled 2019-06-11 (×11): qty 2

## 2019-06-11 MED ORDER — BISACODYL 10 MG RE SUPP
10.0000 mg | Freq: Every day | RECTAL | Status: DC | PRN
  Administered 2019-06-15: 13:00:00 10 mg via RECTAL
  Filled 2019-06-11: qty 1

## 2019-06-11 MED ORDER — INSULIN ASPART 100 UNIT/ML ~~LOC~~ SOLN
4.0000 [IU] | Freq: Three times a day (TID) | SUBCUTANEOUS | Status: DC
  Filled 2019-06-11: qty 1

## 2019-06-11 MED ORDER — SIMVASTATIN 20 MG PO TABS
20.0000 mg | ORAL_TABLET | Freq: Every day | ORAL | Status: DC
  Administered 2019-06-11 – 2019-06-15 (×5): 20 mg via ORAL
  Filled 2019-06-11 (×5): qty 1

## 2019-06-11 MED ORDER — METHOCARBAMOL 500 MG PO TABS
500.0000 mg | ORAL_TABLET | Freq: Four times a day (QID) | ORAL | Status: DC | PRN
  Administered 2019-06-11: 18:00:00 500 mg via ORAL
  Filled 2019-06-11 (×2): qty 1

## 2019-06-11 MED ORDER — ACETAMINOPHEN 500 MG PO TABS
1000.0000 mg | ORAL_TABLET | Freq: Two times a day (BID) | ORAL | Status: DC | PRN
  Administered 2019-06-14: 1000 mg via ORAL
  Filled 2019-06-11: qty 2

## 2019-06-11 MED ORDER — VITAMIN D 25 MCG (1000 UNIT) PO TABS
2000.0000 [IU] | ORAL_TABLET | Freq: Every day | ORAL | Status: DC
  Administered 2019-06-12 – 2019-06-16 (×4): 2000 [IU] via ORAL
  Filled 2019-06-11 (×4): qty 2

## 2019-06-11 MED ORDER — MORPHINE SULFATE (PF) 2 MG/ML IV SOLN
INTRAVENOUS | Status: AC
  Filled 2019-06-11: qty 1

## 2019-06-11 MED ORDER — FENTANYL CITRATE (PF) 100 MCG/2ML IJ SOLN: 50.0000 ug | INTRAMUSCULAR | Status: DC | PRN

## 2019-06-11 MED ORDER — MORPHINE SULFATE (PF) 2 MG/ML IV SOLN
0.5000 mg | INTRAVENOUS | Status: DC | PRN
  Administered 2019-06-11: 0.5 mg via INTRAVENOUS

## 2019-06-11 MED ORDER — FENTANYL CITRATE (PF) 100 MCG/2ML IJ SOLN
75.0000 ug | Freq: Once | INTRAMUSCULAR | Status: AC
  Administered 2019-06-11: 14:00:00 75 ug via INTRAVENOUS
  Filled 2019-06-11: qty 2

## 2019-06-11 NOTE — Progress Notes (Signed)
Patient seen, plan ORIF Monday secondary to Eliquis.  Full consult to follow.

## 2019-06-11 NOTE — ED Provider Notes (Signed)
[facility] Emergency Department Provider Note  ____________________________________________   First MD Initiated Contact with Patient 06/11/19 1358     (approximate)  I have reviewed the triage vital signs and the nursing notes.  History  Chief Complaint Fall and Hip Pain    HPI [name] is a 86 y.o. female with hx of atrial fibrillation, on Eliquis, amongst others as noted below, who presents to the ED for a non-syncopal fall with resultant R hip pain. Patient states she tripped over her slippers, causing her to fall. She fell directly onto her R hip. She denies any head injury or LOC. Denies any other injuries or pain aside from her R hip. Pain is sharp and severe, 10/10 in severity, at the right hip, no radiation, improved with medication and laying still, worsened w/ any kind of movement or adjustment. Denies any weakness or numbness. Denies any preceding presyncopal component to the fall, no lightheadedness, LOC, dizziness.   She reports compliance w/ all her medications. Last took Eliquis this AM.   Past Medical Hx Past Medical History:  Diagnosis Date  . Chronic diarrhea   . Collagenous colitis   . Diabetes mellitus without complication ([place])   . Diverticulitis   . Heart murmur    at birth, none since  . History of echocardiogram    a. 11/2015: echo showing EF of 60-65% with no WMA. Grade 1 DD and mild MR noted.   . Hypertension   . PAF (paroxysmal atrial fibrillation) ([place])    a. initially diagnosed in 08/2016 --> started on Eliquis  . Syncope    a. initially occurring in Fall 2016 b. Hospitalized in 10/2015 and 11/2015 for recurrent episodes.     Problem List Patient Active Problem List   Diagnosis Date Noted  . Intertrochanteric fracture of right femur ([place]) 06/11/2019  . Femur fracture, right ([place]) 06/11/2019  . Hip pain 12/23/2018  . Right ankle pain 12/23/2018  . Mixed hyperlipidemia 04/29/2017  . Lower extremity edema  12/24/2016  . Fatigue 12/24/2016  . Current use of long term anticoagulation 09/17/2016  . PAF (paroxysmal atrial fibrillation) ([place]) 08/13/2016  . Seizure ([place])   . Syncope and collapse   . Syncope 11/21/2015  . ARF (acute renal failure) ([place]) 10/26/2015  . Laceration of right lower leg 09/28/2015  . Bursitis of hip 08/27/2015  . Senile purpura ([place]) 01/01/2015  . Edema 08/15/2014  . Arthritis 07/12/2014  . Basal cell carcinoma 07/12/2014  . CC (collagenous colitis) 07/12/2014  . Essential hypertension 07/12/2014  . Hypercholesteremia 07/12/2014  . Adiposity 07/12/2014  . Acne erythematosa 07/12/2014  . Diabetes mellitus, type 2 ([place]) 07/12/2014  . 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 Avitaminosis D 07/12/2014  . H/O malignant neoplasm of skin 10/10/2011    Past Surgical Hx Past Surgical History:  Procedure Laterality Date  . ABDOMINAL HYSTERECTOMY    . BACK SURGERY  08/08/2008   Lumbar discectomy  . COLONOSCOPY WITH PROPOFOL    . EXCISION/RELEASE BURSA HIP Left 05/03/2019   Procedure: HIP ABDUCTOR REPAIR;  Surgeon: Hessie Knows, MD;  Location: [facility] ORS;  Service: Orthopedics;  Laterality: Left;  . NECK SURGERY  1980    Medications Prior to Admission medications   Medication Sig Start Date End Date Taking? Authorizing Provider  acetaminophen (TYLENOL) 500 MG tablet Take 1,000 mg by mouth 2 (two) times daily as needed (pain.).   Yes [provider]  apixaban (ELIQUIS) 5 MG TABS tablet Take 1 tablet (5 mg  total) by mouth 2 (two) times daily. 04/13/19  Yes End, Harrell Gave, MD  Cholecalciferol (VITAMIN D) 2000 UNITS CAPS Take 2,000 Units by mouth daily.    Yes [provider]  diphenhydramine-acetaminophen (TYLENOL PM) 25-500 MG TABS tablet Take 1 tablet by mouth at bedtime as needed (sleep.).    Yes [provider]  furosemide (LASIX) 20 MG tablet TAKE ONE TABLET BY MOUTH DAILY AS NEEDED Patient taking differently: Take 20 mg by mouth daily as needed (fluid  retention.).  05/25/17  Yes Chrismon, Vickki Muff, PA  glipiZIDE (GLUCOTROL XL) 5 MG 24 hr tablet TAKE ONE TABLET BY MOUTH DAILY WITH BREAKFAST Patient taking differently: Take 5 mg by mouth daily with breakfast. TAKE ONE TABLET BY MOUTH DAILY WITH BREAKFAST. 10/07/18  Yes Chrismon, Vickki Muff, PA  lisinopril (ZESTRIL) 5 MG tablet TAKE ONE TABLET BY MOUTH DAILY Patient taking differently: Take 5 mg by mouth daily.  04/05/19  Yes Chrismon, Vickki Muff, PA  metFORMIN (GLUCOPHAGE) 500 MG tablet TAKE ONE TABLET BY MOUTH TWICE A DAY WITH MEALS Patient taking differently: Take 500 mg by mouth 2 (two) times daily with a meal.  10/07/18  Yes Chrismon, Vickki Muff, PA  metoprolol succinate (TOPROL-XL) 25 MG 24 hr tablet Take 1 tablet (25 mg total) by mouth daily. 12/22/18  Yes End, Harrell Gave, MD  Multiple Vitamin (MULTIVITAMIN WITH MINERALS) TABS tablet Take 1 tablet by mouth daily.   Yes [provider]  simvastatin (ZOCOR) 20 MG tablet TAKE ONE TABLET BY MOUTH EVERY NIGHT AT BEDTIME Patient taking differently: Take 20 mg by mouth at bedtime.  04/05/19  Yes Chrismon, Vickki Muff, PA    Allergies Morphine sulfate and Penicillins  Family Hx Family History  Problem Relation Age of Onset  . Hypertension Mother   . Diabetes Mother   . Lung cancer Father     Social Hx Social History   Tobacco Use  . Smoking status: Former Smoker    Quit date: 03/09/1978    Years since quitting: 41.2  . Smokeless tobacco: Never Used  Substance Use Topics  . Alcohol use: Yes    Alcohol/week: 0.0 - 1.0 standard drinks  . Drug use: No     Review of Systems  Constitutional: Negative for fever. Negative for chills. + fall Eyes: Negative for visual changes. ENT: Negative for sore throat. Cardiovascular: Negative for chest pain. Respiratory: Negative for shortness of breath. Gastrointestinal: Negative for nausea. Negative for vomiting.  Genitourinary: Negative for dysuria. Musculoskeletal: Negative for leg  swelling. + R hip pain Skin: Negative for rash. Neurological: Negative for headaches.   Physical Exam  Vital Signs: ED Triage Vitals  Enc Vitals Group     BP 06/11/19 1401 (!) 170/80     Pulse Rate 06/11/19 1401 74     Resp 06/11/19 1401 20     Temp 06/11/19 1401 98.4 F (36.9 C)     Temp Source 06/11/19 1401 Oral     SpO2 06/11/19 1350 98 %     Weight 06/11/19 1359 158 lb (71.7 kg)     Height 06/11/19 1359 5\' 3"  (1.6 m)     Head Circumference --      Peak Flow --      Pain Score 06/11/19 1354 10     Pain Loc --      Pain Edu? --      Excl. in [place]? --     Constitutional: Alert and oriented. Appears in pain.  Head: Normocephalic. Atraumatic. Eyes:  Conjunctivae clear. Sclera anicteric. Pupils equal and symmetric. Nose: No masses or lesions. No congestion or rhinorrhea. Mouth/Throat: Wearing mask.  Neck: No stridor. Trachea midline.  Cardiovascular: Normal rate, regular rhythm. Extremities well perfused. Respiratory: Normal respiratory effort.  Lungs CTAB. Gastrointestinal: Soft. Non-distended. Non-tender.  Genitourinary: Deferred. Musculoskeletal: RLE guarded, slightly shortened. TTP about proximal R hip. Compartments soft and compressible. RLE 2+ DP pulse by palpation. Toes WWP. Able to wiggle toes. Knee, lower leg, ankle, foot NT to palpation.  Neurologic:  Normal speech and language. No gross focal or lateralizing neurologic deficits are appreciated.  Skin: Skin is warm, dry and intact. No rash noted. Psychiatric: Mood and affect are appropriate for situation.  EKG  Personally reviewed and interpreted by myself.   Date: 06/11/19 Time: 15:11 Rate: 78 Rhythm: sinus Axis: normal Intervals: WNL No STEMI    Radiology  Personally reviewed available imaging myself.   Hip XR  IMPRESSION:  Acute minimally displaced intertrochanteric right femur fracture.    Procedures  Procedure(s) performed (including critical care):  Procedures   Initial Impression /  Assessment and Plan / MDM / ED Course  86 y.o. female who presents to the ED for fall with resultant R hip pain, concerning for fracture.   Will plan for imaging, basic labs, pain control.  Labs reviewed, largely unremarkable. INR 1.3. XR as above.    Clinical Course as of Jun 11 1622  Sat Jun 11, 2019  1518 XR reveals acute minimally displaced intertrochanteric right femur fracture. Will c/s orthopedics and admit to hospitalist.   [SM]    Clinical Course User Index [SM] Lilia Pro., MD     _______________________________   As part of my medical decision making I have reviewed available labs, radiology tests, reviewed old records/chart review, obtained additional history from family at bedside (daughter), and discussed with consultants (Dr. Rudene Christians, orthopedics).     Final Clinical Impression(s) / ED Diagnosis  Final diagnoses:  Fall, initial encounter  Right hip pain  Closed fracture of right hip, initial encounter [facility])       Note:  This document was prepared using Dragon voice recognition software and may include unintentional dictation errors.   Lilia Pro., MD 06/11/19 1630

## 2019-06-11 NOTE — ED Notes (Signed)
Radiology notified that patient has received pain meds

## 2019-06-11 NOTE — ED Notes (Signed)
Family at bedside. 

## 2019-06-11 NOTE — Plan of Care (Signed)
  Problem: Health Behavior/Discharge Planning: Goal: Ability to manage health-related needs will improve Outcome: Progressing   Problem: Clinical Measurements: Goal: Ability to maintain clinical measurements within normal limits will improve Outcome: Progressing Goal: Will remain free from infection Outcome: Progressing Goal: Respiratory complications will improve Outcome: Progressing   Problem: Activity: Goal: Risk for activity intolerance will decrease Outcome: Progressing   Problem: Nutrition: Goal: Adequate nutrition will be maintained Outcome: Progressing   Problem: Coping: Goal: Level of anxiety will decrease Outcome: Progressing   Problem: Elimination: Goal: Will not experience complications related to bowel motility Outcome: Progressing Goal: Will not experience complications related to urinary retention Outcome: Progressing   Problem: Pain Managment: Goal: General experience of comfort will improve Outcome: Progressing   Problem: Safety: Goal: Ability to remain free from injury will improve Outcome: Progressing   Problem: Skin Integrity: Goal: Risk for impaired skin integrity will decrease Outcome: Progressing

## 2019-06-11 NOTE — H&P (Signed)
History and Physical    WARD AMMIRATI [id number] DOB: 07-Feb-1932 DOA: 06/11/2019  PCP: Margo Common, PA   Patient coming from: Home  I have personally briefly reviewed patient's old medical records in [place]  Chief Complaint: Right hip pain  HPI: [name] is a 86 y.o. female with medical history significant for hypertension, diabetes mellitus, atrial fibrillation on chronic anticoagulation therapy who was brought into the emergency room by EMS after she fell at home today.  Patient states that she tripped on her slippers and fell on her right hip.  She was unable to get up and had severe pain in her right hip.  She denied hitting her head and there was no loss of consciousness.  She is status post recent left hip arthroplasty. She denies having any chest pain, no shortness of breath, no headache , no nausea, no vomiting, no abdominal pain or urinary symptoms. She had a right x-ray which showed and acute minimally displaced intertrochanteric right femur fracture.   ED Course: 86 year old female who presents to the emergency room following a mechanical fall at home.  She landed on her right hip and x-ray shows an acute minimally displaced intertrochanteric right femur fracture. She will be admitted to the hospital for further evaluation  Review of Systems: As per HPI otherwise 10 point review of systems negative.    Past Medical History:  Diagnosis Date  . Chronic diarrhea   . Collagenous colitis   . Diabetes mellitus without complication ([place])   . Diverticulitis   . Heart murmur    at birth, none since  . History of echocardiogram    a. 11/2015: echo showing EF of 60-65% with no WMA. Grade 1 DD and mild MR noted.   . Hypertension   . PAF (paroxysmal atrial fibrillation) ([place])    a. initially diagnosed in 08/2016 --> started on Eliquis  . Syncope    a. initially occurring in Fall 2016 b. Hospitalized in 10/2015 and 11/2015 for recurrent episodes.      Past Surgical History:  Procedure Laterality Date  . ABDOMINAL HYSTERECTOMY    . BACK SURGERY  08/08/2008   Lumbar discectomy  . COLONOSCOPY WITH PROPOFOL    . EXCISION/RELEASE BURSA HIP Left 05/03/2019   Procedure: HIP ABDUCTOR REPAIR;  Surgeon: Hessie Knows, MD;  Location: [facility] ORS;  Service: Orthopedics;  Laterality: Left;  . [place]     reports that she quit smoking about 41 years ago. She has never used smokeless tobacco. She reports current alcohol use. She reports that she does not use drugs.  Allergies  Allergen Reactions  . Morphine Sulfate Other (See Comments)    BP bottoms out  . Penicillins Diarrhea    Has patient had a PCN reaction causing immediate rash, facial/tongue/throat swelling, SOB or lightheadedness with hypotension: Yes Has patient had a PCN reaction causing severe rash involving mucus membranes or skin necrosis: Yes Has patient had a PCN reaction that required hospitalization No Has patient had a PCN reaction occurring within the last 10 years: No If all of the above answers are "NO", then may proceed with Cephalosporin use.    Family History  Problem Relation Age of Onset  . Hypertension Mother   . Diabetes Mother   . Lung cancer Father      Prior to Admission medications   Medication Sig Start Date End Date Taking? Authorizing Provider  acetaminophen (TYLENOL) 500 MG tablet Take 1,000 mg by mouth  2 (two) times daily as needed (pain.).    [provider]  apixaban (ELIQUIS) 5 MG TABS tablet Take 1 tablet (5 mg total) by mouth 2 (two) times daily. 04/13/19   End, Harrell Gave, MD  Cholecalciferol (VITAMIN D) 2000 UNITS CAPS Take 2,000 Units by mouth daily.     [provider]  diphenhydramine-acetaminophen (TYLENOL PM) 25-500 MG TABS tablet Take 1 tablet by mouth at bedtime as needed (sleep.).     [provider]  furosemide (LASIX) 20 MG tablet TAKE ONE TABLET BY MOUTH DAILY AS NEEDED Patient taking differently:  Take 20 mg by mouth daily as needed (fluid retention.).  05/25/17   Chrismon, Vickki Muff, PA  glipiZIDE (GLUCOTROL XL) 5 MG 24 hr tablet TAKE ONE TABLET BY MOUTH DAILY WITH BREAKFAST Patient taking differently: Take 5 mg by mouth daily with breakfast. TAKE ONE TABLET BY MOUTH DAILY WITH BREAKFAST. 10/07/18   Chrismon, Vickki Muff, PA  HYDROcodone-acetaminophen (NORCO) 5-325 MG tablet Take 1-2 tablets by mouth every 6 (six) hours as needed for moderate pain. 05/03/19   Hessie Knows, MD  lisinopril (ZESTRIL) 5 MG tablet TAKE ONE TABLET BY MOUTH DAILY Patient taking differently: Take 5 mg by mouth daily.  04/05/19   Chrismon, Vickki Muff, PA  metFORMIN (GLUCOPHAGE) 500 MG tablet TAKE ONE TABLET BY MOUTH TWICE A DAY WITH MEALS Patient taking differently: Take 500 mg by mouth 2 (two) times daily with a meal.  10/07/18   Chrismon, Vickki Muff, PA  metoprolol succinate (TOPROL-XL) 25 MG 24 hr tablet Take 1 tablet (25 mg total) by mouth daily. 12/22/18   End, Harrell Gave, MD  Multiple Vitamin (MULTIVITAMIN WITH MINERALS) TABS tablet Take 1 tablet by mouth daily.    [provider]  simvastatin (ZOCOR) 20 MG tablet TAKE ONE TABLET BY MOUTH EVERY NIGHT AT BEDTIME Patient taking differently: Take 20 mg by mouth at bedtime.  04/05/19   Chrismon, Vickki Muff, PA    Physical Exam: Vitals:   06/11/19 1350 06/11/19 1359 06/11/19 1401  BP:   (!) 170/80  Pulse:   74  Resp:   20  Temp:   98.4 F (36.9 C)  TempSrc:   Oral  SpO2: 98%  98%  Weight:  71.7 kg   Height:  5\' 3"  (1.6 m)      Vitals:   06/11/19 1350 06/11/19 1359 06/11/19 1401  BP:   (!) 170/80  Pulse:   74  Resp:   20  Temp:   98.4 F (36.9 C)  TempSrc:   Oral  SpO2: 98%  98%  Weight:  71.7 kg   Height:  5\' 3"  (1.6 m)     Constitutional: NAD, alert and oriented x 3. Appears to be in painful distress Eyes: PERRL, lids and conjunctivae normal ENMT: Mucous membranes are moist.  Neck: normal, supple, no masses, no thyromegaly Respiratory: clear  to auscultation bilaterally, no wheezing, no crackles. Normal respiratory effort. No accessory muscle use.  Cardiovascular: Regular rate and rhythm, no murmurs / rubs / gallops. No extremity edema. 2+ pedal pulses. No carotid bruits. Abdomen: no tenderness, no masses palpated. No hepatosplenomegaly. Bowel sounds positive.  Musculoskeletal: no clubbing / cyanosis. No joint deformity upper and lower extremities.  Skin: no rashes, lesions, ulcers.  Neurologic: No gross focal neurologic deficit. Psychiatric: Normal mood and affect.   Labs on Admission: I have personally reviewed following labs and imaging studies  CBC: Recent Labs  Lab 06/11/19 1513  WBC 9.1  HGB 12.4  HCT 36.7  MCV 90.4  PLT [id number]   Basic Metabolic Panel: No results for input(s): NA, K, CL, CO2, GLUCOSE, BUN, CREATININE, CALCIUM, MG, PHOS in the last 168 hours. GFR: CrCl cannot be calculated (Patient's most recent lab result is older than the maximum 21 days allowed.). Liver Function Tests: No results for input(s): AST, ALT, ALKPHOS, BILITOT, PROT, ALBUMIN in the last 168 hours. No results for input(s): LIPASE, AMYLASE in the last 168 hours. No results for input(s): AMMONIA in the last 168 hours. Coagulation Profile: No results for input(s): INR, PROTIME in the last 168 hours. Cardiac Enzymes: No results for input(s): CKTOTAL, CKMB, CKMBINDEX, TROPONINI in the last 168 hours. BNP (last 3 results) No results for input(s): PROBNP in the last 8760 hours. HbA1C: No results for input(s): HGBA1C in the last 72 hours. CBG: No results for input(s): GLUCAP in the last 168 hours. Lipid Profile: No results for input(s): CHOL, HDL, LDLCALC, TRIG, CHOLHDL, LDLDIRECT in the last 72 hours. Thyroid Function Tests: No results for input(s): TSH, T4TOTAL, FREET4, T3FREE, THYROIDAB in the last 72 hours. Anemia Panel: No results for input(s): VITAMINB12, FOLATE, FERRITIN, TIBC, IRON, RETICCTPCT in the last 72 hours. Urine  analysis:    Component Value Date/Time   COLORURINE AMBER (A) 04/27/2018 0236   APPEARANCEUR CLOUDY (A) 04/27/2018 0236   APPEARANCEUR Clear 11/21/2015 0919   LABSPEC 1.023 04/27/2018 0236   PHURINE 5.0 04/27/2018 0236   GLUCOSEU NEGATIVE 04/27/2018 0236   HGBUR NEGATIVE 04/27/2018 0236   BILIRUBINUR Neg 05/03/2018 1305   BILIRUBINUR Negative 11/21/2015 0919   KETONESUR 20 (A) 04/27/2018 0236   PROTEINUR Negative 05/03/2018 1305   PROTEINUR 30 (A) 04/27/2018 0236   UROBILINOGEN 0.2 05/03/2018 1305   NITRITE Neg 05/03/2018 1305   NITRITE NEGATIVE 04/27/2018 0236   LEUKOCYTESUR Negative 05/03/2018 1305   LEUKOCYTESUR SMALL (A) 04/27/2018 0236    Radiological Exams on Admission: DG Chest 1 View  Result Date: 06/11/2019 CLINICAL DATA:  Fall today with right hip fracture. EXAM: CHEST  1 VIEW COMPARISON:  04/27/2018 FINDINGS: Low lung volumes. Unchanged elevation of left hemidiaphragm with adjacent atelectasis or scarring. Normal heart size with unchanged mediastinal contours. Mild aortic atherosclerosis. No pulmonary edema, confluent airspace disease, large pleural effusion or pneumothorax. No acute osseous abnormalities are seen. IMPRESSION: Low lung volumes without acute abnormality. Chronic elevation of left hemidiaphragm. Aortic Atherosclerosis (ICD10-I70.0). Electronically Signed   By: Keith Rake M.D.   On: 06/11/2019 15:03   DG Hip Unilat With Pelvis 2-3 Views Right  Result Date: 06/11/2019 CLINICAL DATA:  Fall today with right hip injury. Right hip pain. Tripped over slippers. EXAM: DG HIP (WITH OR WITHOUT PELVIS) 2-3V RIGHT COMPARISON:  None. FINDINGS: Acute intertrochanteric right proximal femur fracture. Fracture is minimally displaced and involves both greater and lesser trochanters. Displacement is less than 1 cm. Femoral head remains seated in the acetabulum. Mild right hip osteoarthritis. Pubic rami are intact. No additional fracture of the pelvis. IMPRESSION: Acute  minimally displaced intertrochanteric right femur fracture. Electronically Signed   By: Keith Rake M.D.   On: 06/11/2019 15:02    EKG: Independently reviewed.  Sinus rhythm  Assessment/Plan Principal Problem:   Intertrochanteric fracture of right femur (HCC) Active Problems:   Essential hypertension   Diabetes mellitus, type 2 (HCC)   PAF (paroxysmal atrial fibrillation) (HCC)    Status post fall with intertrochanteric fracture of right femur Patient is status post a mechanical fall at home and has a fracture of the right  femur Immobilize right lower extremity Pain control Consult orthopedic surgery   Paroxysmal atrial fibrillation Patient is currently in sinus rhythm Continue metoprolol Hold Eliquis for planned procedure   Hypertension Blood pressure is stable Continue metoprolol and Lisinopril   Diabetes mellitus Maintain consistent carbohydrate diet Sliding scale coverage   DVT prophylaxis: SCD Code Status: Full Family Communication: Plan of care was discussed with patient and her daughter in detail. She verbalizes understanding and agrees with the plan Disposition Plan: TBD Consults called: Orthopedic surgery    Fatim Vanderschaaf MD [facility]     06/11/2019, 3:28 PM

## 2019-06-11 NOTE — ED Triage Notes (Addendum)
Pt arrived via [facility] from home with reports of falling today after tripping on slippers while using walker. PT had surgery on L hip in February by Dr. Rudene Christians for arthritis and bone spurs, reports falling on R hip today, unable to extend legs, +PMS.   Pt alert and oriented on arrival, received a total of 141mcg of Fentanyl with EMS and 100cc NS.  Pt had elevated blood sugar of 301.  Pt denies any LOC

## 2019-06-11 NOTE — ED Notes (Signed)
X ray in room.

## 2019-06-12 DIAGNOSIS — S72144D Nondisplaced intertrochanteric fracture of right femur, subsequent encounter for closed fracture with routine healing: Secondary | ICD-10-CM

## 2019-06-12 DIAGNOSIS — I10 Essential (primary) hypertension: Secondary | ICD-10-CM

## 2019-06-12 LAB — BASIC METABOLIC PANEL
Anion gap: 8 (ref 5–15)
BUN: 25 mg/dL — ABNORMAL HIGH (ref 8–23)
CO2: 26 mmol/L (ref 22–32)
Calcium: 9.2 mg/dL (ref 8.9–10.3)
Chloride: 106 mmol/L (ref 98–111)
Creatinine, Ser: 0.9 mg/dL (ref 0.44–1.00)
GFR calc Af Amer: >60 mL/min (ref >60)
GFR calc non Af Amer: 58 mL/min — ABNORMAL LOW (ref >60)
Glucose, Bld: 134 mg/dL — ABNORMAL HIGH (ref 70–99)
Potassium: 4.8 mmol/L (ref 3.5–5.1)
Sodium: 140 mmol/L (ref 135–145)

## 2019-06-12 LAB — GLUCOSE, CAPILLARY
Glucose-Capillary: 111 mg/dL — ABNORMAL HIGH (ref 70–99)
Glucose-Capillary: 143 mg/dL — ABNORMAL HIGH (ref 70–99)
Glucose-Capillary: 155 mg/dL — ABNORMAL HIGH (ref 70–99)
Glucose-Capillary: 170 mg/dL — ABNORMAL HIGH (ref 70–99)

## 2019-06-12 LAB — CBC
HCT: 36.4 % (ref 36.0–46.0)
Hemoglobin: 12 g/dL (ref 12.0–15.0)
MCH: 30.3 pg (ref 26.0–34.0)
MCHC: 33 g/dL (ref 30.0–36.0)
MCV: 91.9 fL (ref 80.0–100.0)
Platelets: 246 10*3/uL (ref 150–400)
RBC: 3.96 MIL/uL (ref 3.87–5.11)
RDW: 12.1 % (ref 11.5–15.5)
WBC: 10.5 10*3/uL (ref 4.0–10.5)
nRBC: 0 % (ref 0.0–0.2)

## 2019-06-12 LAB — MRSA PCR SCREENING: MRSA by PCR: NEGATIVE

## 2019-06-12 MED ORDER — CLINDAMYCIN PHOSPHATE 600 MG/50ML IV SOLN
600.0000 mg | Freq: Once | INTRAVENOUS | Status: AC
  Administered 2019-06-13: 600 mg via INTRAVENOUS
  Filled 2019-06-12: qty 50

## 2019-06-12 MED ORDER — NEPRO/CARBSTEADY PO LIQD
237.0000 mL | ORAL | Status: DC
  Administered 2019-06-12 – 2019-06-14 (×2): 237 mL via ORAL

## 2019-06-12 NOTE — Plan of Care (Signed)
  Problem: Health Behavior/Discharge Planning: Goal: Ability to manage health-related needs will improve Outcome: Progressing   Problem: Clinical Measurements: Goal: Ability to maintain clinical measurements within normal limits will improve Outcome: Progressing Goal: Will remain free from infection Outcome: Progressing   Problem: Activity: Goal: Risk for activity intolerance will decrease Outcome: Progressing   Problem: Nutrition: Goal: Adequate nutrition will be maintained Outcome: Progressing   Problem: Coping: Goal: Level of anxiety will decrease Outcome: Progressing   Problem: Elimination: Goal: Will not experience complications related to bowel motility Outcome: Progressing Goal: Will not experience complications related to urinary retention Outcome: Progressing   Problem: Pain Managment: Goal: General experience of comfort will improve Outcome: Progressing   Problem: Safety: Goal: Ability to remain free from injury will improve Outcome: Progressing   Problem: Skin Integrity: Goal: Risk for impaired skin integrity will decrease Outcome: Progressing   

## 2019-06-12 NOTE — Progress Notes (Signed)
PROGRESS NOTE                                                                                                                                                                                                             Patient Demographics:    [name], is a 86 y.o. female, DOB - 08-10-1931, [id number]  Admit date - 06/11/2019   Admitting Physician Collier Bullock, MD  Outpatient Primary MD for the patient is Chrismon, Vickki Muff, PA  LOS - 1  Outpatient Specialists: [facility] (Dr End)   Chief Complaint  Patient presents with  . Fall  . Hip Pain       Brief Narrative   86 year old female with history of hypertension, paroxysmal A. fib on Eliquis, diabetes mellitus, recent left hip abductor repair 6 weeks back who presented after a mechanical fall landing on her right hip and sustained minimally displaced intertrochanteric right femur fracture. Seen by orthopedic surgery and plan for OR on 4/5 Eliquis is cleared from the system.   Subjective:   Seen and examined.  Reports pain in the right hip controlled on current pain meds.   Assessment  & Plan :    Principal Problem:   Intertrochanteric fracture of right femur ([place]) Secondary to mechanical fall.  Pain control with as needed oral Vicodin and IV morphine for severe pain.  IV Robaxin as needed for muscle spasms.  Bowel regimen. N.p.o. after midnight for surgery tomorrow.   Active Problems:   PAF (paroxysmal atrial fibrillation) (HCC) Rate controlled.  Continue Toprol and maintain perioperative beta-blockade.  Eliquis discontinued for surgery.  Resume postop.    Essential hypertension Stable.  Continue lisinopril and metoprolol.    Diabetes mellitus, type 2, controlled without complication (HCC) 123456 of 7.  On glipizide at home which is held.  Stable on sliding scale coverage.  Dyslipidemia Continue statin.  Preop cardiac clearance Based on her  assessment the [place] Safe pre and 30-day postoperative risk for myocardial infarction or cardiac arrhythmia is only 0.4%. Patient was seen by her cardiologist in February before the left hip surgery and had 2D echo done which was normal.  She does not have any chest pain symptoms and admission EKG is unremarkable. She is already on perioperative beta-blocker and stable on telemetry.  No further preoperative cardiac work-up is needed.   Code Status :  Full code  Family Communication  : Daughter at bedside  Disposition Plan  : SNF postop  Barriers For Discharge : Right hip fracture, pending surgery  Consults  : Orthopedics  Procedures  : None  DVT Prophylaxis  : SCDs (for surgery), resume Eliquis postop  Lab Results  Component Value Date   PLT 246 06/12/2019    Antibiotics  :   Anti-infectives (From admission, onward)   Start     Dose/Rate Route Frequency Ordered Stop   06/13/19 1300  clindamycin (CLEOCIN) IVPB 600 mg     600 mg 100 mL/hr over 30 Minutes Intravenous  Once 06/12/19 0735          Objective:   Vitals:   06/11/19 1818 06/11/19 2342 06/12/19 0731 06/12/19 1149  BP: 123/60 (!) 143/58 (!) 144/78 130/66  Pulse: 64 65 84 63  Resp: 17 18 17 18   Temp: 99.1 F (37.3 C) 98.9 F (37.2 C) 99.2 F (37.3 C) 98.3 F (36.8 C)  TempSrc: Oral Oral Oral Oral  SpO2: 96% 97% 93% 94%  Weight:      Height:        Wt Readings from Last 3 Encounters:  06/11/19 71.7 kg  05/03/19 73.5 kg  04/13/19 74.4 kg     Intake/Output Summary (Last 24 hours) at 06/12/2019 1152 Last data filed at 06/12/2019 0612 Gross per 24 hour  Intake --  Output 250 ml  Net -250 ml     Physical Exam  Gen: not in distress HEENT:  moist mucosa, supple neck Chest: clear b/l, no added sounds CVS: S1-S2 regular, no murmur GI: soft, NT, ND, BS+ Musculoskeletal: warm, no edema, impaired mobility of right hip     Data Review:    CBC Recent Labs  Lab 06/11/19 1513 06/12/19 0446  WBC 9.1  10.5  HGB 12.4 12.0  HCT 36.7 36.4  PLT 240 246  MCV 90.4 91.9  MCH 30.5 30.3  MCHC 33.8 33.0  RDW 12.1 12.1    Chemistries  Recent Labs  Lab 06/11/19 1513 06/11/19 1805 06/12/19 0446  NA 142 141 140  K 4.5 4.4 4.8  CL 107 108 106  CO2 27 25 26   GLUCOSE 102* 152* 134*  BUN 27* 25* 25*  CREATININE 0.79 0.63 0.90  CALCIUM 9.3 9.0 9.2   ------------------------------------------------------------------------------------------------------------------ No results for input(s): CHOL, HDL, LDLCALC, TRIG, CHOLHDL, LDLDIRECT in the last 72 hours.  Lab Results  Component Value Date   HGBA1C 7.0 (H) 06/11/2019   ------------------------------------------------------------------------------------------------------------------ No results for input(s): TSH, T4TOTAL, T3FREE, THYROIDAB in the last 72 hours.  Invalid input(s): FREET3 ------------------------------------------------------------------------------------------------------------------ No results for input(s): VITAMINB12, FOLATE, FERRITIN, TIBC, IRON, RETICCTPCT in the last 72 hours.  Coagulation profile Recent Labs  Lab 06/11/19 1513  INR 1.3*    No results for input(s): DDIMER in the last 72 hours.  Cardiac Enzymes No results for input(s): CKMB, TROPONINI, MYOGLOBIN in the last 168 hours.  Invalid input(s): CK ------------------------------------------------------------------------------------------------------------------ No results found for: BNP  Inpatient Medications  Scheduled Meds: . cholecalciferol  2,000 Units Oral Daily  . feeding supplement (NEPRO CARB STEADY)  237 mL Oral Q24H  . insulin aspart  0-15 Units Subcutaneous TID WC  . insulin aspart  0-5 Units Subcutaneous QHS  . insulin aspart  4 Units Subcutaneous TID WC  . lisinopril  5 mg Oral Daily  . metoprolol succinate  25 mg Oral Daily  . multivitamin with minerals  1 tablet Oral Daily  . simvastatin  20  mg Oral QHS   Continuous  Infusions: . [START ON 06/13/2019] clindamycin (CLEOCIN) IV     PRN Meds:.acetaminophen, bisacodyl, furosemide, HYDROcodone-acetaminophen, methocarbamol, morphine injection, ondansetron (ZOFRAN) IV  Micro Results Recent Results (from the past 240 hour(s))  Respiratory Panel by RT PCR (Flu A&B, Covid) - Nasopharyngeal Swab     Status: None   Collection Time: 06/11/19  3:41 PM   Specimen: Nasopharyngeal Swab  Result Value Ref Range Status   SARS Coronavirus 2 by RT PCR NEGATIVE NEGATIVE Final    Comment: (NOTE) SARS-CoV-2 target nucleic acids are NOT DETECTED. The SARS-CoV-2 RNA is generally detectable in upper respiratoy specimens during the acute phase of infection. The lowest concentration of SARS-CoV-2 viral copies this assay can detect is 131 copies/mL. A negative result does not preclude SARS-Cov-2 infection and should not be used as the sole basis for treatment or other patient management decisions. A negative result may occur with  improper specimen collection/handling, submission of specimen other than nasopharyngeal swab, presence of viral mutation(s) within the areas targeted by this assay, and inadequate number of viral copies (<131 copies/mL). A negative result must be combined with clinical observations, patient history, and epidemiological information. The expected result is Negative. Fact Sheet for Patients:  PinkCheek.be Fact Sheet for Healthcare Providers:  GravelBags.it This test is not yet ap proved or cleared by the [place] FDA and  has been authorized for detection and/or diagnosis of SARS-CoV-2 by FDA under an Emergency Use Authorization (EUA). This EUA will remain  in effect (meaning this test can be used) for the duration of the COVID-19 declaration under Section 564(b)(1) of the Act, 21 U.S.C. section 360bbb-3(b)(1), unless the authorization is terminated or revoked sooner.    Influenza A by PCR  NEGATIVE NEGATIVE Final   Influenza B by PCR NEGATIVE NEGATIVE Final    Comment: (NOTE) The Xpert Xpress SARS-CoV-2/FLU/RSV assay is intended as an aid in  the diagnosis of influenza from Nasopharyngeal swab specimens and  should not be used as a sole basis for treatment. Nasal washings and  aspirates are unacceptable for Xpert Xpress SARS-CoV-2/FLU/RSV  testing. Fact Sheet for Patients: PinkCheek.be Fact Sheet for Healthcare Providers: GravelBags.it This test is not yet approved or cleared by the [place] FDA and  has been authorized for detection and/or diagnosis of SARS-CoV-2 by  FDA under an Emergency Use Authorization (EUA). This EUA will remain  in effect (meaning this test can be used) for the duration of the  Covid-19 declaration under Section 564(b)(1) of the Act, 21  U.S.C. section 360bbb-3(b)(1), unless the authorization is  terminated or revoked. Performed at [facility], [address] [place], [place] [address]     Radiology Reports DG Chest 1 View  Result Date: 06/11/2019 CLINICAL DATA:  Fall today with right hip fracture. EXAM: CHEST  1 VIEW COMPARISON:  04/27/2018 FINDINGS: Low lung volumes. Unchanged elevation of left hemidiaphragm with adjacent atelectasis or scarring. Normal heart size with unchanged mediastinal contours. Mild aortic atherosclerosis. No pulmonary edema, confluent airspace disease, large pleural effusion or pneumothorax. No acute osseous abnormalities are seen. IMPRESSION: Low lung volumes without acute abnormality. Chronic elevation of left hemidiaphragm. Aortic Atherosclerosis (ICD10-I70.0). Electronically Signed   By: Keith Rake M.D.   On: 06/11/2019 15:03   DG Hip Unilat With Pelvis 2-3 Views Right  Result Date: 06/11/2019 CLINICAL DATA:  Fall today with right hip injury. Right hip pain. Tripped over slippers. EXAM: DG HIP (WITH OR WITHOUT PELVIS) 2-3V RIGHT COMPARISON:  None. FINDINGS: Acute intertrochanteric right proximal femur fracture. Fracture is minimally displaced and involves both greater and lesser trochanters. Displacement is less than 1 cm. Femoral head remains seated in the acetabulum. Mild right hip osteoarthritis. Pubic rami are intact. No additional fracture of the pelvis. IMPRESSION: Acute minimally displaced intertrochanteric right femur fracture. Electronically Signed   By: Keith Rake M.D.   On: 06/11/2019 15:02    Time Spent in minutes  25   Jenna Ardoin M.D on 06/12/2019 at 11:52 AM  Between 7am to 7pm - Pager - [phone]  After 7pm go to [email or web address] - password [facility]  [facility] -  Office  [phone]

## 2019-06-12 NOTE — Consult Note (Signed)
Reason for Consult: Right intertrochanteric hip fracture Referring Physician: Dr. Juliann Mule is an 86 y.o. female.  HPI: Patient is an 86 year old who has been recuperating from a left hip abductor tendon repair.  She has been using a walker twisted yesterday and fell to the ground onto her right side suffering a fracture.  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  She denies prodromal symptoms.  No loss of consciousness.  Past Medical History:  Diagnosis Date  . Chronic diarrhea   . Collagenous colitis   . Diabetes mellitus without complication ([place])   . Diverticulitis   . Heart murmur    at birth, none since  . History of echocardiogram    a. 11/2015: echo showing EF of 60-65% with no WMA. Grade 1 DD and mild MR noted.   . Hypertension   . PAF (paroxysmal atrial fibrillation) ([place])    a. initially diagnosed in 08/2016 --> started on Eliquis  . Syncope    a. initially occurring in Fall 2016 b. Hospitalized in 10/2015 and 11/2015 for recurrent episodes.     Past Surgical History:  Procedure Laterality Date  . ABDOMINAL HYSTERECTOMY    . BACK SURGERY  08/08/2008   Lumbar discectomy  . COLONOSCOPY WITH PROPOFOL    . EXCISION/RELEASE BURSA HIP Left 05/03/2019   Procedure: HIP ABDUCTOR REPAIR;  Surgeon: Hessie Knows, MD;  Location: [facility] ORS;  Service: Orthopedics;  Laterality: Left;  . NECK SURGERY  1980    Family History  Problem Relation Age of Onset  . Hypertension Mother   . Diabetes Mother   . Lung cancer Father     Social History:  reports that she quit smoking about 41 years ago. She has never used smokeless tobacco. She reports current alcohol use. She reports that she does not use drugs.  Allergies:  Allergies  Allergen Reactions  . Morphine Sulfate Other (See Comments)    BP bottoms out  . Penicillins Diarrhea    Has patient had a PCN reaction causing immediate rash, facial/tongue/throat swelling, SOB or lightheadedness with  hypotension: Yes Has patient had a PCN reaction causing severe rash involving mucus membranes or skin necrosis: Yes Has patient had a PCN reaction that required hospitalization No Has patient had a PCN reaction occurring within the last 10 years: No If all of the above answers are "NO", then may proceed with Cephalosporin use.    Medications: I have reviewed the patient's current medications.  Results for orders placed or performed during the hospital encounter of 06/11/19 (from the past 48 hour(s))  Basic metabolic panel     Status: Abnormal   Collection Time: 06/11/19  3:13 PM  Result Value Ref Range   Sodium 142 135 - 145 mmol/L   Potassium 4.5 3.5 - 5.1 mmol/L   Chloride 107 98 - 111 mmol/L   CO2 27 22 - 32 mmol/L   Glucose, Bld 102 (H) 70 - 99 mg/dL    Comment: Glucose reference range applies only to samples taken after fasting for at least 8 hours.   BUN 27 (H) 8 - 23 mg/dL   Creatinine, Ser 0.79 0.44 - 1.00 mg/dL   Calcium 9.3 8.9 - 10.3 mg/dL   GFR calc non Af Amer >60 >60 mL/min   GFR calc Af Amer >60 >60 mL/min   Anion gap 8 5 - 15    Comment: Performed at [facility], [address], [place],  [address]  CBC  Status: None   Collection Time: 06/11/19  3:13 PM  Result Value Ref Range   WBC 9.1 4.0 - 10.5 K/uL   RBC 4.06 3.87 - 5.11 MIL/uL   Hemoglobin 12.4 12.0 - 15.0 g/dL   HCT 36.7 36.0 - 46.0 %   MCV 90.4 80.0 - 100.0 fL   MCH 30.5 26.0 - 34.0 pg   MCHC 33.8 30.0 - 36.0 g/dL   RDW 12.1 11.5 - 15.5 %   Platelets 240 150 - 400 K/uL   nRBC 0.0 0.0 - 0.2 %    Comment: Performed at [facility], [address] [place], [place] [address]  Protime-INR     Status: Abnormal   Collection Time: 06/11/19  3:13 PM  Result Value Ref Range   Prothrombin Time 15.6 (H) 11.4 - 15.2 seconds   INR 1.3 (H) 0.8 - 1.2    Comment: (NOTE) INR goal varies based on device and disease states. Performed at [facility], [address]  [place], [place] [address]   Hemoglobin A1c     Status: Abnormal   Collection Time: 06/11/19  3:13 PM  Result Value Ref Range   Hgb A1c MFr Bld 7.0 (H) 4.8 - 5.6 %    Comment: (NOTE) Pre diabetes:          5.7%-6.4% Diabetes:              >6.4% Glycemic control for   <7.0% adults with diabetes    Mean Plasma Glucose 154.2 mg/dL    Comment: Performed at [facility] [address], [place], [place] [address]  Respiratory Panel by RT PCR (Flu A&B, Covid) - Nasopharyngeal Swab     Status: None   Collection Time: 06/11/19  3:41 PM   Specimen: Nasopharyngeal Swab  Result Value Ref Range   SARS Coronavirus 2 by RT PCR NEGATIVE NEGATIVE    Comment: (NOTE) SARS-CoV-2 target nucleic acids are NOT DETECTED. The SARS-CoV-2 RNA is generally detectable in upper respiratoy specimens during the acute phase of infection. The lowest concentration of SARS-CoV-2 viral copies this assay can detect is 131 copies/mL. A negative result does not preclude SARS-Cov-2 infection and should not be used as the sole basis for treatment or other patient management decisions. A negative result may occur with  improper specimen collection/handling, submission of specimen other than nasopharyngeal swab, presence of viral mutation(s) within the areas targeted by this assay, and inadequate number of viral copies (<131 copies/mL). A negative result must be combined with clinical observations, patient history, and epidemiological information. The expected result is Negative. Fact Sheet for Patients:  PinkCheek.be Fact Sheet for Healthcare Providers:  GravelBags.it This test is not yet ap proved or cleared by the [place] FDA and  has been authorized for detection and/or diagnosis of SARS-CoV-2 by FDA under an Emergency Use Authorization (EUA). This EUA will remain  in effect (meaning this test can be used) for the duration of the COVID-19 declaration  under Section 564(b)(1) of the Act, 21 U.S.C. section 360bbb-3(b)(1), unless the authorization is terminated or revoked sooner.    Influenza A by PCR NEGATIVE NEGATIVE   Influenza B by PCR NEGATIVE NEGATIVE    Comment: (NOTE) The Xpert Xpress SARS-CoV-2/FLU/RSV assay is intended as an aid in  the diagnosis of influenza from Nasopharyngeal swab specimens and  should not be used as a sole basis for treatment. Nasal washings and  aspirates are unacceptable for Xpert Xpress SARS-CoV-2/FLU/RSV  testing. Fact Sheet for  Patients: PinkCheek.be Fact Sheet for Healthcare Providers: GravelBags.it This test is not yet approved or cleared by the [place] FDA and  has been authorized for detection and/or diagnosis of SARS-CoV-2 by  FDA under an Emergency Use Authorization (EUA). This EUA will remain  in effect (meaning this test can be used) for the duration of the  Covid-19 declaration under Section 564(b)(1) of the Act, 21  U.S.C. section 360bbb-3(b)(1), unless the authorization is  terminated or revoked. Performed at [facility], [address] [place], [place] [id number]   Basic metabolic panel     Status: Abnormal   Collection Time: 06/11/19  6:05 PM  Result Value Ref Range   Sodium 141 135 - 145 mmol/L   Potassium 4.4 3.5 - 5.1 mmol/L   Chloride 108 98 - 111 mmol/L   CO2 25 22 - 32 mmol/L   Glucose, Bld 152 (H) 70 - 99 mg/dL    Comment: Glucose reference range applies only to samples taken after fasting for at least 8 hours.   BUN 25 (H) 8 - 23 mg/dL   Creatinine, Ser 0.63 0.44 - 1.00 mg/dL   Calcium 9.0 8.9 - 10.3 mg/dL   GFR calc non Af Amer >60 >60 mL/min   GFR calc Af Amer >60 >60 mL/min   Anion gap 8 5 - 15    Comment: Performed at [facility], [address] [place], [place] [address]  Glucose, capillary     Status: Abnormal   Collection Time: 06/11/19  9:29 PM  Result Value Ref Range    Glucose-Capillary 124 (H) 70 - 99 mg/dL    Comment: Glucose reference range applies only to samples taken after fasting for at least 8 hours.  CBC     Status: None   Collection Time: 06/12/19  4:46 AM  Result Value Ref Range   WBC 10.5 4.0 - 10.5 K/uL   RBC 3.96 3.87 - 5.11 MIL/uL   Hemoglobin 12.0 12.0 - 15.0 g/dL   HCT 36.4 36.0 - 46.0 %   MCV 91.9 80.0 - 100.0 fL   MCH 30.3 26.0 - 34.0 pg   MCHC 33.0 30.0 - 36.0 g/dL   RDW 12.1 11.5 - 15.5 %   Platelets 246 150 - 400 K/uL   nRBC 0.0 0.0 - 0.2 %    Comment: Performed at [facility], [address] [place], [place] [id number]  Basic metabolic panel     Status: Abnormal   Collection Time: 06/12/19  4:46 AM  Result Value Ref Range   Sodium 140 135 - 145 mmol/L   Potassium 4.8 3.5 - 5.1 mmol/L   Chloride 106 98 - 111 mmol/L   CO2 26 22 - 32 mmol/L   Glucose, Bld 134 (H) 70 - 99 mg/dL    Comment: Glucose reference range applies only to samples taken after fasting for at least 8 hours.   BUN 25 (H) 8 - 23 mg/dL   Creatinine, Ser 0.90 0.44 - 1.00 mg/dL   Calcium 9.2 8.9 - 10.3 mg/dL   GFR calc non Af Amer 58 (L) >60 mL/min   GFR calc Af Amer >60 >60 mL/min   Anion gap 8 5 - 15    Comment: Performed at [facility], [address] [place],  [address]    DG Chest 1 View  Result Date: 06/11/2019 CLINICAL DATA:  Fall today with right hip fracture. EXAM: CHEST  1 VIEW COMPARISON:  04/27/2018 FINDINGS: Low lung  volumes. Unchanged elevation of left hemidiaphragm with adjacent atelectasis or scarring. Normal heart size with unchanged mediastinal contours. Mild aortic atherosclerosis. No pulmonary edema, confluent airspace disease, large pleural effusion or pneumothorax. No acute osseous abnormalities are seen. IMPRESSION: Low lung volumes without acute abnormality. Chronic elevation of left hemidiaphragm. Aortic Atherosclerosis (ICD10-I70.0). Electronically Signed   By: Keith Rake M.D.   On: 06/11/2019 15:03    DG Hip Unilat With Pelvis 2-3 Views Right  Result Date: 06/11/2019 CLINICAL DATA:  Fall today with right hip injury. Right hip pain. Tripped over slippers. EXAM: DG HIP (WITH OR WITHOUT PELVIS) 2-3V RIGHT COMPARISON:  None. FINDINGS: Acute intertrochanteric right proximal femur fracture. Fracture is minimally displaced and involves both greater and lesser trochanters. Displacement is less than 1 cm. Femoral head remains seated in the acetabulum. Mild right hip osteoarthritis. Pubic rami are intact. No additional fracture of the pelvis. IMPRESSION: Acute minimally displaced intertrochanteric right femur fracture. Electronically Signed   By: Keith Rake M.D.   On: 06/11/2019 15:02    Review of Systems Blood pressure (!) 144/78, pulse 84, temperature 99.2 F (37.3 C), temperature source Oral, resp. rate 17, height 5\' 3"  (1.6 m), weight 71.7 kg, SpO2 93 %. Physical Exam right leg is slightly shortened without external rotation.  Distally she is neurovascular intact Radiographic review reveals comminuted intertrochanteric fracture with minimal displacement and minimal degenerative change  Assessment/Plan: Right intertrochanteric hip fracture. Plan open reduction internal fixation tomorrow after she is been off her Eliquis adequate time.  She be high risk for excessive bleeding with surgery prior to then.  Risks of surgery discussed in particular DVT/PE and infection.  Hessie Knows 06/12/2019, 7:35 AM

## 2019-06-12 NOTE — Progress Notes (Addendum)
Initial Nutrition Assessment  DOCUMENTATION CODES:   Not applicable  INTERVENTION:   Nepro Shake po daily, each supplement provides 425 kcal and 19 grams protein  MVI daily   Recommend Oscal w/ D po BID  NUTRITION DIAGNOSIS:   Increased nutrient needs related to post-op healing as evidenced by increased estimated needs.  GOAL:   Patient will meet greater than or equal to 90% of their needs  MONITOR:   PO intake, Supplement acceptance, Labs, Weight trends, Skin, I & O's  REASON FOR ASSESSMENT:   Consult Hip fracture protocol  ASSESSMENT:   86 y.o. female with medical history significant for hypertension, diabetes mellitus, atrial fibrillation on chronic anticoagulation therapy who is admitted with hip fracture s/p fall   RD working remotely.  Spoke with pt's daughter via phone. Daughter reports that patient ate pretty good at breakfast this morning; pt is also planning on eating lunch today. Pt does drink Ensure sometimes at home; pt enjoys the dark chocolate flavor. Pt would like to have mixed berry flavor while in hospital. RD discussed with pt's daughter the importance of adequate nutrition needed for post op healing and to preserve lean muscle. Recommended patient eating foods high in protein and calcium to support healing. RD will add supplements to help pt meet her estimated needs. Plan is for ORIF 4/5.  Per chart, pt is weight stable at baseline.   Medications reviewed and include: D3, insulin, MVI, clindamycin   Labs reviewed: BUN 25(H) cbgs- 124, 111 x 24 hrs AIC 7.0(H)- 4/3  NUTRITION - FOCUSED PHYSICAL EXAM: Unable to perform at this time   Diet Order:   Diet Order            Diet NPO time specified  Diet effective midnight        Diet Carb Modified Fluid consistency: Thin; Room service appropriate? Yes  Diet effective now             EDUCATION NEEDS:   Education needs have been addressed  Skin:  Skin Assessment: Reviewed RN  Assessment  Last BM:  4/4  Height:   Ht Readings from Last 1 Encounters:  06/11/19 5\' 3"  (1.6 m)    Weight:   Wt Readings from Last 1 Encounters:  06/11/19 71.7 kg    Ideal Body Weight:  52.3 kg  BMI:  Body mass index is 27.99 kg/m.  Estimated Nutritional Needs:   Kcal:  1500-1700kcal/day  Protein:  75-85g/day  Fluid:  1.5L/day  Koleen Distance MS, RD, LDN Please refer to [facility] for RD and/or RD on-call/weekend/after hours pager

## 2019-06-13 ENCOUNTER — Encounter: Payer: Self-pay | Admitting: Internal Medicine

## 2019-06-13 ENCOUNTER — Encounter
Admission: EM | Disposition: A | Discharge status: Skilled Nursing Facility | Payer: Self-pay | Source: Home / Self Care | Attending: Internal Medicine

## 2019-06-13 ENCOUNTER — Inpatient Hospital Stay: Payer: Medicare Other

## 2019-06-13 ENCOUNTER — Inpatient Hospital Stay: Payer: Medicare Other | Admitting: Anesthesiology

## 2019-06-13 DIAGNOSIS — I480 Paroxysmal atrial fibrillation: Secondary | ICD-10-CM

## 2019-06-13 DIAGNOSIS — W19XXXA Unspecified fall, initial encounter: Secondary | ICD-10-CM

## 2019-06-13 HISTORY — PX: INTRAMEDULLARY (IM) NAIL INTERTROCHANTERIC: SHX5875

## 2019-06-13 LAB — GLUCOSE, CAPILLARY
Glucose-Capillary: 188 mg/dL — ABNORMAL HIGH (ref 70–99)
Glucose-Capillary: 152 mg/dL — ABNORMAL HIGH (ref 70–99)
Glucose-Capillary: 182 mg/dL — ABNORMAL HIGH (ref 70–99)
Glucose-Capillary: 213 mg/dL — ABNORMAL HIGH (ref 70–99)
Glucose-Capillary: 275 mg/dL — ABNORMAL HIGH (ref 70–99)

## 2019-06-13 SURGERY — FIXATION, FRACTURE, INTERTROCHANTERIC, WITH INTRAMEDULLARY ROD
Anesthesia: General | Laterality: Right

## 2019-06-13 MED ORDER — METOCLOPRAMIDE HCL 5 MG/ML IJ SOLN: 5.0000 mg | Freq: Three times a day (TID) | INTRAMUSCULAR | Status: DC | PRN

## 2019-06-13 MED ORDER — MAGNESIUM HYDROXIDE 400 MG/5ML PO SUSP
30.0000 mL | Freq: Every day | ORAL | Status: DC | PRN
  Administered 2019-06-15: 30 mL via ORAL
  Filled 2019-06-13: qty 30

## 2019-06-13 MED ORDER — APIXABAN 5 MG PO TABS
5.0000 mg | ORAL_TABLET | Freq: Two times a day (BID) | ORAL | Status: DC
  Administered 2019-06-14 – 2019-06-16 (×5): 5 mg via ORAL
  Filled 2019-06-13 (×5): qty 1

## 2019-06-13 MED ORDER — ONDANSETRON HCL 4 MG/2ML IJ SOLN: 4.0000 mg | Freq: Four times a day (QID) | INTRAMUSCULAR | Status: DC | PRN

## 2019-06-13 MED ORDER — LACTATED RINGERS IV SOLN: INTRAVENOUS | Status: DC | PRN

## 2019-06-13 MED ORDER — PHENYLEPHRINE HCL (PRESSORS) 10 MG/ML IV SOLN
INTRAVENOUS | Status: DC | PRN
  Administered 2019-06-13: 100 ug via INTRAVENOUS

## 2019-06-13 MED ORDER — LIDOCAINE HCL (CARDIAC) PF 100 MG/5ML IV SOSY
PREFILLED_SYRINGE | INTRAVENOUS | Status: DC | PRN
  Administered 2019-06-13: 50 mg via INTRAVENOUS

## 2019-06-13 MED ORDER — ALUM & MAG HYDROXIDE-SIMETH 200-200-20 MG/5ML PO SUSP: 30.0000 mL | ORAL | Status: DC | PRN

## 2019-06-13 MED ORDER — MAGNESIUM CITRATE PO SOLN
1.0000 | Freq: Once | ORAL | Status: DC | PRN
  Filled 2019-06-13: qty 296

## 2019-06-13 MED ORDER — NEOMYCIN-POLYMYXIN B GU 40-200000 IR SOLN
Status: AC
  Filled 2019-06-13: qty 2

## 2019-06-13 MED ORDER — FENTANYL CITRATE (PF) 100 MCG/2ML IJ SOLN
25.0000 ug | INTRAMUSCULAR | Status: DC | PRN
  Administered 2019-06-13 (×3): 50 ug via INTRAVENOUS

## 2019-06-13 MED ORDER — MIDAZOLAM HCL 2 MG/2ML IJ SOLN
INTRAMUSCULAR | Status: AC
  Filled 2019-06-13: qty 2

## 2019-06-13 MED ORDER — FENTANYL CITRATE (PF) 100 MCG/2ML IJ SOLN
INTRAMUSCULAR | Status: AC
  Filled 2019-06-13: qty 2

## 2019-06-13 MED ORDER — PHENOL 1.4 % MT LIQD
1.0000 | OROMUCOSAL | Status: DC | PRN
  Filled 2019-06-13: qty 177

## 2019-06-13 MED ORDER — DEXAMETHASONE SODIUM PHOSPHATE 10 MG/ML IJ SOLN
INTRAMUSCULAR | Status: DC | PRN
  Administered 2019-06-13: 10 mg via INTRAVENOUS

## 2019-06-13 MED ORDER — MENTHOL 3 MG MT LOZG
1.0000 | LOZENGE | OROMUCOSAL | Status: DC | PRN
  Filled 2019-06-13: qty 9

## 2019-06-13 MED ORDER — ONDANSETRON HCL 4 MG/2ML IJ SOLN
INTRAMUSCULAR | Status: DC | PRN
  Administered 2019-06-13: 4 mg via INTRAVENOUS

## 2019-06-13 MED ORDER — CLINDAMYCIN PHOSPHATE 600 MG/50ML IV SOLN
600.0000 mg | Freq: Four times a day (QID) | INTRAVENOUS | Status: AC
  Administered 2019-06-13 – 2019-06-14 (×3): 600 mg via INTRAVENOUS
  Filled 2019-06-13 (×3): qty 50

## 2019-06-13 MED ORDER — NEOMYCIN-POLYMYXIN B GU 40-200000 IR SOLN
Status: DC | PRN
  Administered 2019-06-13: 2 mL

## 2019-06-13 MED ORDER — DOCUSATE SODIUM 100 MG PO CAPS
100.0000 mg | ORAL_CAPSULE | Freq: Two times a day (BID) | ORAL | Status: DC
  Administered 2019-06-13 – 2019-06-16 (×5): 100 mg via ORAL
  Filled 2019-06-13 (×6): qty 1

## 2019-06-13 MED ORDER — PROPOFOL 500 MG/50ML IV EMUL
INTRAVENOUS | Status: AC
  Filled 2019-06-13: qty 50

## 2019-06-13 MED ORDER — BISACODYL 10 MG RE SUPP: 10.0000 mg | Freq: Every day | RECTAL | Status: DC | PRN

## 2019-06-13 MED ORDER — ONDANSETRON HCL 4 MG PO TABS
4.0000 mg | ORAL_TABLET | Freq: Four times a day (QID) | ORAL | Status: DC | PRN
  Administered 2019-06-14: 14:00:00 4 mg via ORAL
  Filled 2019-06-13: qty 1

## 2019-06-13 MED ORDER — METOCLOPRAMIDE HCL 10 MG PO TABS: 5.0000 mg | ORAL_TABLET | Freq: Three times a day (TID) | ORAL | Status: DC | PRN

## 2019-06-13 MED ORDER — FENTANYL CITRATE (PF) 100 MCG/2ML IJ SOLN
INTRAMUSCULAR | Status: DC | PRN
  Administered 2019-06-13: 50 ug via INTRAVENOUS
  Administered 2019-06-13 (×2): 25 ug via INTRAVENOUS

## 2019-06-13 MED ORDER — PROPOFOL 10 MG/ML IV BOLUS
INTRAVENOUS | Status: DC | PRN
  Administered 2019-06-13: 80 mg via INTRAVENOUS
  Administered 2019-06-13: 60 mg via INTRAVENOUS

## 2019-06-13 MED ORDER — ONDANSETRON HCL 4 MG/2ML IJ SOLN: 4.0000 mg | Freq: Once | INTRAMUSCULAR | Status: DC | PRN

## 2019-06-13 MED ORDER — SODIUM CHLORIDE 0.9 % IV SOLN: INTRAVENOUS | Status: DC

## 2019-06-13 SURGICAL SUPPLY — 34 items
BIT DRILL 4.3MMS DISTAL GRDTED (BIT) ×1 IMPLANT
CANISTER SUCT 1200ML W/VALVE (MISCELLANEOUS) ×3 IMPLANT
CHLORAPREP W/TINT 26 (MISCELLANEOUS) ×3 IMPLANT
COVER WAND RF STERILE (DRAPES) ×3 IMPLANT
DRAPE 3/4 80X56 (DRAPES) ×3 IMPLANT
DRAPE U-SHAPE 47X51 STRL (DRAPES) ×3 IMPLANT
DRILL 4.3MMS DISTAL GRADUATED (BIT) ×3
DRSG OPSITE POSTOP 3X4 (GAUZE/BANDAGES/DRESSINGS) ×3 IMPLANT
GLOVE BIOGEL PI IND STRL 9 (GLOVE) ×1 IMPLANT
GLOVE BIOGEL PI INDICATOR 9 (GLOVE) ×2
GLOVE SURG SYN 9.0  PF PI (GLOVE) ×2
GLOVE SURG SYN 9.0 PF PI (GLOVE) ×1 IMPLANT
GOWN SRG 2XL LVL 4 RGLN SLV (GOWNS) ×1 IMPLANT
GOWN STRL NON-REIN 2XL LVL4 (GOWNS) ×2
GOWN STRL REUS W/ TWL LRG LVL3 (GOWN DISPOSABLE) ×1 IMPLANT
GOWN STRL REUS W/TWL LRG LVL3 (GOWN DISPOSABLE) ×2
GUIDEPIN VERSANAIL DSP 3.2X444 (ORTHOPEDIC DISPOSABLE SUPPLIES) ×3 IMPLANT
GUIDEWIRE BALL NOSE 100CM (WIRE) ×3 IMPLANT
HFN RH 130 DEG 9MM X 360MM (Nail) ×3 IMPLANT
HIP FRA NAIL LAG SCREW 10.5X90 (Orthopedic Implant) ×3 IMPLANT
KIT TURNOVER KIT A (KITS) ×3 IMPLANT
MAT ABSORB  FLUID 56X50 GRAY (MISCELLANEOUS) ×2
MAT ABSORB FLUID 56X50 GRAY (MISCELLANEOUS) ×1 IMPLANT
NEEDLE FILTER BLUNT 18X 1/2SAF (NEEDLE) ×2
NEEDLE FILTER BLUNT 18X1 1/2 (NEEDLE) ×1 IMPLANT
NS IRRIG 500ML POUR BTL (IV SOLUTION) ×3 IMPLANT
PACK HIP COMPR (MISCELLANEOUS) ×3 IMPLANT
SCALPEL PROTECTED #15 DISP (BLADE) ×6 IMPLANT
SCREW BONE CORTICAL 5.0X50 (Screw) ×3 IMPLANT
SCREW LAG HIP FRA NAIL 10.5X90 (Orthopedic Implant) ×1 IMPLANT
STAPLER SKIN PROX 35W (STAPLE) ×3 IMPLANT
SUT VIC AB 1 CT1 36 (SUTURE) ×3 IMPLANT
SUT VIC AB 2-0 CT1 (SUTURE) ×3 IMPLANT
SYR 10ML LL (SYRINGE) ×3 IMPLANT

## 2019-06-13 NOTE — Op Note (Signed)
06/13/2019  2:12 PM  PATIENT:  [name]  86 y.o. female  PRE-OPERATIVE DIAGNOSIS:  Hip fracture right intertrochanteric  POST-OPERATIVE DIAGNOSIS:  Hip fracture right intertrochanteric  PROCEDURE:  Procedure(s): INTRAMEDULLARY (IM) NAIL INTERTROCHANTRIC (Right)  SURGEON: Laurene Footman, MD  ASSISTANTS: None  ANESTHESIA:   general  EBL:  No intake/output data recorded.  BLOOD ADMINISTERED:none  DRAINS: none   LOCAL MEDICATIONS USED:  NONE  SPECIMEN:  No Specimen  DISPOSITION OF SPECIMEN:  N/A  COUNTS:  YES  TOURNIQUET:  * No tourniquets in log *  IMPLANTS: Biomet affixes 9 x 360 right with 90 mm leg screw and 50 mm distal screw  DICTATION: .Dragon Dictation patient was brought to the operating room and after adequate general anesthesia was obtained she was transferred to the fracture table with the left leg in the well-leg holder right foot in the traction boot with mild traction applied.  C arm was brought in and good visualization of both AP and lateral projections obtained.  After prepping and draping in the usual sterile method using a barrier drape, appropriate patient identification and timeout procedure were completed.  With the C arm to aid in position a small incision was made proximal to greater trochanter and a guidewire was inserted to the tip of the trochanter the center center position tapped into the proximal femur and then proximal reaming carried out followed by passage of a long wire down the canal.  Measurement was made off of this determine rod length and with her use of a blood thinner unreamed 9 mm rod was inserted to the appropriate depth.  A guidewire was inserted into the center center position on the AP and lateral views measurement made and a 98 mm screw inserted followed by release of traction and compression.  The setscrew was placed proximally and the insertion handle removed.  Going to the distal thigh a good view of the lateral distal femur  was obtained and through the slotted hole a small incision was made drilling carried out and 50 mm screw inserted to fix the rod distally.  Wounds were then irrigated with the deep wound closed using #1 Vicryl 2-0 Vicryl subcutaneously and skin staples.  Xeroform 4 x 4 ABD and foam tape applied  PLAN OF CARE: Continue as inpatient  PATIENT DISPOSITION:  PACU - hemodynamically stable.

## 2019-06-13 NOTE — Progress Notes (Signed)
PROGRESS NOTE    [name]  [id number] DOB: 01/14/1932 DOA: 06/11/2019 PCP: Margo Common, PA   Brief Narrative:  86 year old female with history of hypertension, paroxysmal A. fib on Eliquis, diabetes mellitus, recent left hip abductor repair 6 weeks back who presented after a mechanical fall landing on her right hip and sustained minimally displaced intertrochanteric right femur fracture. Seen by orthopedic surgery and plan for OR today as they were waiting for Eliquis clearance from her system.  Subjective: Continues to experience pain with any body movements.  She try not to move to avoid pain.  She was accompanied by her daughter and granddaughter in the room and waiting for surgery.  Assessment & Plan:   Principal Problem:   Intertrochanteric fracture of right femur (HCC) Active Problems:   Essential hypertension   Diabetes mellitus, type 2 (HCC)   PAF (paroxysmal atrial fibrillation) (HCC)   Femur fracture, right ([place])  Intertrochanteric fracture of right femur ([place]) Secondary to mechanical fall.  Going for ORIF today.  Patient was on Eliquis which was held since admission. -Continue Pain control with as needed oral Vicodin and IV morphine for severe pain.  -Continue IV Robaxin as needed for muscle spasms.  -Continue Bowel regimen with constipation secondary to opioids.  PAF (paroxysmal atrial fibrillation) (HCC) Rate controlled.  - Continue Toprol and maintain perioperative beta-blockade.   -Eliquis discontinued for surgery.  Resume postop.    Essential hypertension.  Than goal. - Continue lisinopril and metoprolol.    Diabetes mellitus, type 2, controlled without complication (HCC) 123456 of 7.  On glipizide at home which is held.  - Stable on sliding scale coverage.  Dyslipidemia Continue statin.   Objective: Vitals:   06/12/19 1149 06/12/19 1536 06/12/19 2322 06/13/19 0803  BP: 130/66 125/70 129/71 114/62  Pulse: 63 72 79 96  Resp: 18 17 16     Temp: 98.3 F (36.8 C) 98.5 F (36.9 C) 98.4 F (36.9 C) 98.7 F (37.1 C)  TempSrc: Oral Oral Oral Oral  SpO2: 94% 94% 99% 93%  Weight:      Height:        Intake/Output Summary (Last 24 hours) at 06/13/2019 1220 Last data filed at 06/13/2019 0631 Gross per 24 hour  Intake 240 ml  Output 1325 ml  Net -1085 ml   Filed Weights   06/11/19 1359  Weight: 71.7 kg    Examination:  General exam: Pleasant elderly lady, appears calm and comfortable  Respiratory system: Clear to auscultation. Respiratory effort normal. Cardiovascular system: S1 & S2 heard, RRR. No JVD, murmurs, rubs, gallops or clicks. Gastrointestinal system: Soft, nontender, nondistended, bowel sounds positive. Central nervous system: Alert and oriented. No focal neurological deficits.Symmetric 5 x 5 power. Extremities: No edema, no cyanosis, pulses intact and symmetrical. Psychiatry: Judgement and insight appear normal.    DVT prophylaxis: SCDs.-Will go back on Eliquis after surgery. Code Status: Full Family Communication: Daughter and granddaughter were updated at bedside. Disposition Plan: Pending improvement after surgery.  Patient will go to SNF. Daughter wants her to go to [facility].  Consultants:   Orthopedic.  Procedures:  Antimicrobials:   Data Reviewed: I have personally reviewed following labs and imaging studies  CBC: Recent Labs  Lab 06/11/19 1513 06/12/19 0446  WBC 9.1 10.5  HGB 12.4 12.0  HCT 36.7 36.4  MCV 90.4 91.9  PLT 240 0000000   Basic Metabolic Panel: Recent Labs  Lab 06/11/19 1513 06/11/19 1805 06/12/19 0446  NA 142 141 140  K 4.5 4.4 4.8  CL 107 108 106  CO2 27 25 26   GLUCOSE 102* 152* 134*  BUN 27* 25* 25*  CREATININE 0.79 0.63 0.90  CALCIUM 9.3 9.0 9.2   GFR: Estimated Creatinine Clearance: 43.4 mL/min (by C-G formula based on SCr of 0.9 mg/dL). Liver Function Tests: No results for input(s): AST, ALT, ALKPHOS, BILITOT, PROT, ALBUMIN in the last 168 hours. No  results for input(s): LIPASE, AMYLASE in the last 168 hours. No results for input(s): AMMONIA in the last 168 hours. Coagulation Profile: Recent Labs  Lab 06/11/19 1513  INR 1.3*   Cardiac Enzymes: No results for input(s): CKTOTAL, CKMB, CKMBINDEX, TROPONINI in the last 168 hours. BNP (last 3 results) No results for input(s): PROBNP in the last 8760 hours. HbA1C: Recent Labs    06/11/19 1513  HGBA1C 7.0*   CBG: Recent Labs  Lab 06/12/19 1151 06/12/19 1629 06/12/19 2127 06/13/19 0807 06/13/19 1147  GLUCAP 143* 155* 170* 188* 152*   Lipid Profile: No results for input(s): CHOL, HDL, LDLCALC, TRIG, CHOLHDL, LDLDIRECT in the last 72 hours. Thyroid Function Tests: No results for input(s): TSH, T4TOTAL, FREET4, T3FREE, THYROIDAB in the last 72 hours. Anemia Panel: No results for input(s): VITAMINB12, FOLATE, FERRITIN, TIBC, IRON, RETICCTPCT in the last 72 hours. Sepsis Labs: No results for input(s): PROCALCITON, LATICACIDVEN in the last 168 hours.  Recent Results (from the past 240 hour(s))  Respiratory Panel by RT PCR (Flu A&B, Covid) - Nasopharyngeal Swab     Status: None   Collection Time: 06/11/19  3:41 PM   Specimen: Nasopharyngeal Swab  Result Value Ref Range Status   SARS Coronavirus 2 by RT PCR NEGATIVE NEGATIVE Final    Comment: (NOTE) SARS-CoV-2 target nucleic acids are NOT DETECTED. The SARS-CoV-2 RNA is generally detectable in upper respiratoy specimens during the acute phase of infection. The lowest concentration of SARS-CoV-2 viral copies this assay can detect is 131 copies/mL. A negative result does not preclude SARS-Cov-2 infection and should not be used as the sole basis for treatment or other patient management decisions. A negative result may occur with  improper specimen collection/handling, submission of specimen other than nasopharyngeal swab, presence of viral mutation(s) within the areas targeted by this assay, and inadequate number of viral  copies (<131 copies/mL). A negative result must be combined with clinical observations, patient history, and epidemiological information. The expected result is Negative. Fact Sheet for Patients:  PinkCheek.be Fact Sheet for Healthcare Providers:  GravelBags.it This test is not yet ap proved or cleared by the [place] FDA and  has been authorized for detection and/or diagnosis of SARS-CoV-2 by FDA under an Emergency Use Authorization (EUA). This EUA will remain  in effect (meaning this test can be used) for the duration of the COVID-19 declaration under Section 564(b)(1) of the Act, 21 U.S.C. section 360bbb-3(b)(1), unless the authorization is terminated or revoked sooner.    Influenza A by PCR NEGATIVE NEGATIVE Final   Influenza B by PCR NEGATIVE NEGATIVE Final    Comment: (NOTE) The Xpert Xpress SARS-CoV-2/FLU/RSV assay is intended as an aid in  the diagnosis of influenza from Nasopharyngeal swab specimens and  should not be used as a sole basis for treatment. Nasal washings and  aspirates are unacceptable for Xpert Xpress SARS-CoV-2/FLU/RSV  testing. Fact Sheet for Patients: PinkCheek.be Fact Sheet for Healthcare Providers: GravelBags.it This test is not yet approved or cleared by the [place] FDA and  has been authorized for detection and/or diagnosis of SARS-CoV-2  by  FDA under an Emergency Use Authorization (EUA). This EUA will remain  in effect (meaning this test can be used) for the duration of the  Covid-19 declaration under Section 564(b)(1) of the Act, 21  U.S.C. section 360bbb-3(b)(1), unless the authorization is  terminated or revoked. Performed at [facility], [address] [place], [place] [address]   MRSA PCR Screening     Status: None   Collection Time: 06/12/19  2:24 PM   Specimen: Nasal Mucosa; Nasopharyngeal  Result Value  Ref Range Status   MRSA by PCR NEGATIVE NEGATIVE Final    Comment:        The GeneXpert MRSA Assay (FDA approved for NASAL specimens only), is one component of a comprehensive MRSA colonization surveillance program. It is not intended to diagnose MRSA infection nor to guide or monitor treatment for MRSA infections. Performed at [facility], [address], [place], [place] [address]      Radiology Studies: DG Chest 1 View  Result Date: 06/11/2019 CLINICAL DATA:  Fall today with right hip fracture. EXAM: CHEST  1 VIEW COMPARISON:  04/27/2018 FINDINGS: Low lung volumes. Unchanged elevation of left hemidiaphragm with adjacent atelectasis or scarring. Normal heart size with unchanged mediastinal contours. Mild aortic atherosclerosis. No pulmonary edema, confluent airspace disease, large pleural effusion or pneumothorax. No acute osseous abnormalities are seen. IMPRESSION: Low lung volumes without acute abnormality. Chronic elevation of left hemidiaphragm. Aortic Atherosclerosis (ICD10-I70.0). Electronically Signed   By: Keith Rake M.D.   On: 06/11/2019 15:03   DG Hip Unilat With Pelvis 2-3 Views Right  Result Date: 06/11/2019 CLINICAL DATA:  Fall today with right hip injury. Right hip pain. Tripped over slippers. EXAM: DG HIP (WITH OR WITHOUT PELVIS) 2-3V RIGHT COMPARISON:  None. FINDINGS: Acute intertrochanteric right proximal femur fracture. Fracture is minimally displaced and involves both greater and lesser trochanters. Displacement is less than 1 cm. Femoral head remains seated in the acetabulum. Mild right hip osteoarthritis. Pubic rami are intact. No additional fracture of the pelvis. IMPRESSION: Acute minimally displaced intertrochanteric right femur fracture. Electronically Signed   By: Keith Rake M.D.   On: 06/11/2019 15:02    Scheduled Meds: . [MAR Hold] cholecalciferol  2,000 Units Oral Daily  . [MAR Hold] feeding supplement (NEPRO CARB STEADY)  237 mL Oral  Q24H  . [MAR Hold] insulin aspart  0-15 Units Subcutaneous TID WC  . [MAR Hold] insulin aspart  0-5 Units Subcutaneous QHS  . [MAR Hold] lisinopril  5 mg Oral Daily  . [MAR Hold] metoprolol succinate  25 mg Oral Daily  . [MAR Hold] multivitamin with minerals  1 tablet Oral Daily  . [MAR Hold] simvastatin  20 mg Oral QHS   Continuous Infusions: . [MAR Hold] clindamycin (CLEOCIN) IV       LOS: 2 days   Time spent: 40 minutes.  Lorella Nimrod, MD [facility]  If 7PM-7AM, please contact night-coverage [email or web address]  06/13/2019, 12:20 PM   This record has been created using Systems analyst. Errors have been sought and corrected,but may not always be located. Such creation errors do not reflect on the standard of care.

## 2019-06-13 NOTE — Transfer of Care (Signed)
Immediate Anesthesia Transfer of Care Note  [name]  Procedure(s) Performed: INTRAMEDULLARY (IM) NAIL INTERTROCHANTRIC (Right )  Patient Location: PACU  Anesthesia Type:General  Level of Consciousness: awake and sedated  Airway & Oxygen Therapy: Patient Spontanous Breathing  Post-op Assessment: Report given to RN  Post vital signs: stable  Last Vitals:  Vitals Value Taken Time  BP 182/82 06/13/19 1413  Temp 37.6 C 06/13/19 1413  Pulse 91 06/13/19 1417  Resp 18 06/13/19 1417  SpO2 98 % 06/13/19 1417  Vitals shown include unvalidated device data.  Last Pain:  Vitals:   06/13/19 1413  TempSrc:   PainSc: 0-No pain      Patients Stated Pain Goal: 5 ([id number])  Complications: No apparent anesthesia complications

## 2019-06-13 NOTE — Plan of Care (Signed)
  Problem: Health Behavior/Discharge Planning: Goal: Ability to manage health-related needs will improve Outcome: Progressing   Problem: Clinical Measurements: Goal: Ability to maintain clinical measurements within normal limits will improve Outcome: Progressing Goal: Will remain free from infection Outcome: Progressing   Problem: Activity: Goal: Risk for activity intolerance will decrease Outcome: Progressing   Problem: Nutrition: Goal: Adequate nutrition will be maintained Outcome: Progressing   Problem: Coping: Goal: Level of anxiety will decrease Outcome: Progressing   Problem: Elimination: Goal: Will not experience complications related to bowel motility Outcome: Progressing Goal: Will not experience complications related to urinary retention Outcome: Progressing   Problem: Pain Managment: Goal: General experience of comfort will improve Outcome: Progressing   Problem: Safety: Goal: Ability to remain free from injury will improve Outcome: Progressing   Problem: Skin Integrity: Goal: Risk for impaired skin integrity will decrease Outcome: Progressing   

## 2019-06-13 NOTE — Anesthesia Procedure Notes (Signed)
Procedure Name: LMA Insertion Date/Time: 06/13/2019 1:09 PM Performed by: Leander Rams, CRNA Pre-anesthesia Checklist: Patient identified, Patient being monitored, Timeout performed, Emergency Drugs available and Suction available Patient Re-evaluated:Patient Re-evaluated prior to induction Oxygen Delivery Method: Circle system utilized Preoxygenation: Pre-oxygenation with 100% oxygen Induction Type: IV induction Ventilation: Mask ventilation without difficulty LMA: LMA inserted LMA Size: 4.0 Tube type: Oral Number of attempts: 1 Placement Confirmation: positive ETCO2 and breath sounds checked- equal and bilateral Tube secured with: Tape Dental Injury: Teeth and Oropharynx as per pre-operative assessment

## 2019-06-13 NOTE — Anesthesia Preprocedure Evaluation (Signed)
Anesthesia Evaluation  Patient identified by MRN, date of birth, ID band Patient awake    Reviewed: Allergy & Precautions, H&P , NPO status , Patient's Chart, lab work & pertinent test results  History of Anesthesia Complications Negative for: history of anesthetic complications  Airway Mallampati: II  TM Distance: >3 FB Neck ROM: full    Dental  (+) Chipped   Pulmonary neg pulmonary ROS, former smoker,           Cardiovascular hypertension, (-) angina+ Peripheral Vascular Disease  (-) Past MI and (-) Cardiac Stents + dysrhythmias (on Eliquis) Atrial Fibrillation + Valvular Problems/Murmurs   H/o recurrent syncope thought to be vasovagal in nature.  Seen by Cardiology, recommended echo which was WNL apart from grade 1 diastolic dysfunction, no further workup recommended at this time, cleared for surgery.  Echo 04/20/18: 1. Left ventricular ejection fraction, by estimation, is 55 to 60%. The  left ventricle has normal function. The left ventricle has no regional  wall motion abnormalities. Left ventricular diastolic parameters are  consistent with Grade I diastolic  dysfunction (impaired relaxation).  2. Right ventricular systolic function is normal. The right ventricular  size is normal.    Neuro/Psych negative neurological ROS  negative psych ROS   GI/Hepatic negative GI ROS, Neg liver ROS,   Endo/Other  diabetes  Renal/GU ARFRenal disease     Musculoskeletal   Abdominal   Peds  Hematology negative hematology ROS (+)   Anesthesia Other Findings Past Medical History: No date: Chronic diarrhea No date: Collagenous colitis No date: Diabetes mellitus without complication (HCC) No date: Diverticulitis No date: Heart murmur     Comment:  at birth, none since No date: History of echocardiogram     Comment:  a. 11/2015: echo showing EF of 60-65% with no WMA. Grade              1 DD and mild MR noted.  No date:  Hypertension No date: PAF (paroxysmal atrial fibrillation) ([place])     Comment:  a. initially diagnosed in 08/2016 --> started on Eliquis No date: Syncope     Comment:  a. initially occurring in Fall 2016 b. Hospitalized in               10/2015 and 11/2015 for recurrent episodes.   Past Surgical History: No date: ABDOMINAL HYSTERECTOMY 08/08/2008: BACK SURGERY     Comment:  Lumbar discectomy No date: COLONOSCOPY WITH PROPOFOL 1980: NECK SURGERY     Reproductive/Obstetrics negative OB ROS                             Anesthesia Physical  Anesthesia Plan  ASA: III  Anesthesia Plan: General   Post-op Pain Management:    Induction: Intravenous  PONV Risk Score and Plan: Ondansetron, Dexamethasone and Treatment may vary due to age or medical condition  Airway Management Planned: LMA  Additional Equipment:   Intra-op Plan:   Post-operative Plan: Extubation in OR  Informed Consent: I have reviewed the patients History and Physical, chart, labs and discussed the procedure including the risks, benefits and alternatives for the proposed anesthesia with the patient or authorized representative who has indicated his/her understanding and acceptance.     Dental Advisory Given  Plan Discussed with: Anesthesiologist  Anesthesia Plan Comments: (Last dose Eliquis 4 days ago)        Anesthesia Quick Evaluation

## 2019-06-14 ENCOUNTER — Inpatient Hospital Stay (HOSPITAL_COMMUNITY)
Admit: 2019-06-14 | Discharge: 2019-06-14 | Disposition: A | Discharge status: Home / Self Care | Payer: Medicare Other | Attending: Internal Medicine | Admitting: Internal Medicine

## 2019-06-14 DIAGNOSIS — R079 Chest pain, unspecified: Secondary | ICD-10-CM

## 2019-06-14 DIAGNOSIS — R55 Syncope and collapse: Secondary | ICD-10-CM

## 2019-06-14 DIAGNOSIS — R0789 Other chest pain: Secondary | ICD-10-CM

## 2019-06-14 LAB — BASIC METABOLIC PANEL
Anion gap: 10 (ref 5–15)
BUN: 26 mg/dL — ABNORMAL HIGH (ref 8–23)
CO2: 24 mmol/L (ref 22–32)
Calcium: 8.5 mg/dL — ABNORMAL LOW (ref 8.9–10.3)
Chloride: 104 mmol/L (ref 98–111)
Creatinine, Ser: 0.76 mg/dL (ref 0.44–1.00)
GFR calc Af Amer: >60 mL/min (ref >60)
GFR calc non Af Amer: >60 mL/min (ref >60)
Glucose, Bld: 278 mg/dL — ABNORMAL HIGH (ref 70–99)
Potassium: 4.9 mmol/L (ref 3.5–5.1)
Sodium: 138 mmol/L (ref 135–145)

## 2019-06-14 LAB — TROPONIN I (HIGH SENSITIVITY)
Troponin I (High Sensitivity): 85 ng/L — ABNORMAL HIGH (ref <18)
Troponin I (High Sensitivity): 89 ng/L — ABNORMAL HIGH (ref <18)
Troponin I (High Sensitivity): 98 ng/L — ABNORMAL HIGH (ref <18)
Troponin I (High Sensitivity): 105 ng/L (ref <18)
Troponin I (High Sensitivity): 76 ng/L — ABNORMAL HIGH (ref <18)

## 2019-06-14 LAB — CBC
HCT: 35.4 % — ABNORMAL LOW (ref 36.0–46.0)
Hemoglobin: 11.5 g/dL — ABNORMAL LOW (ref 12.0–15.0)
MCH: 29.9 pg (ref 26.0–34.0)
MCHC: 32.5 g/dL (ref 30.0–36.0)
MCV: 91.9 fL (ref 80.0–100.0)
Platelets: 207 10*3/uL (ref 150–400)
RBC: 3.85 MIL/uL — ABNORMAL LOW (ref 3.87–5.11)
RDW: 12.4 % (ref 11.5–15.5)
WBC: 14.8 10*3/uL — ABNORMAL HIGH (ref 4.0–10.5)
nRBC: 0 % (ref 0.0–0.2)

## 2019-06-14 LAB — GLUCOSE, CAPILLARY
Glucose-Capillary: 223 mg/dL — ABNORMAL HIGH (ref 70–99)
Glucose-Capillary: 198 mg/dL — ABNORMAL HIGH (ref 70–99)
Glucose-Capillary: 224 mg/dL — ABNORMAL HIGH (ref 70–99)
Glucose-Capillary: 225 mg/dL — ABNORMAL HIGH (ref 70–99)

## 2019-06-14 MED ORDER — NITROGLYCERIN 0.4 MG SL SUBL
0.4000 mg | SUBLINGUAL_TABLET | SUBLINGUAL | Status: DC | PRN
  Administered 2019-06-14: 14:00:00 0.4 mg via SUBLINGUAL
  Filled 2019-06-14 (×2): qty 1

## 2019-06-14 MED ORDER — ASPIRIN EC 81 MG PO TBEC
81.0000 mg | DELAYED_RELEASE_TABLET | Freq: Every day | ORAL | Status: DC
  Administered 2019-06-14 – 2019-06-15 (×2): 81 mg via ORAL
  Filled 2019-06-14 (×2): qty 1

## 2019-06-14 MED ORDER — KETOROLAC TROMETHAMINE 15 MG/ML IJ SOLN
15.0000 mg | Freq: Once | INTRAMUSCULAR | Status: AC
  Administered 2019-06-14: 15 mg via INTRAVENOUS
  Filled 2019-06-14: qty 1

## 2019-06-14 MED ORDER — LIDOCAINE 5 % EX PTCH
1.0000 | MEDICATED_PATCH | CUTANEOUS | Status: DC
  Administered 2019-06-14 – 2019-06-15 (×2): 1 via TRANSDERMAL
  Filled 2019-06-14 (×3): qty 1

## 2019-06-14 MED ORDER — PERFLUTREN LIPID MICROSPHERE
1.0000 mL | INTRAVENOUS | Status: AC | PRN
  Administered 2019-06-14: 2 mL via INTRAVENOUS
  Filled 2019-06-14: qty 10

## 2019-06-14 NOTE — NC FL2 (Signed)
[place] LEVEL OF CARE SCREENING TOOL     IDENTIFICATION  Patient Name: [name] Birthdate: 1931-06-11 Sex: female Admission Date (Current Location): 06/11/2019  [place] and [place] Number:  Engineering geologist and Address:  [facility], [address], [place], [place] [address]      Provider Number: [id number]  Attending Physician Name and Address:  Lorella Nimrod, MD  Relative Name and Phone Number:  Neoma Laming [phone]    Current Level of Care: Hospital Recommended Level of Care: [place] Prior Approval Number:    Date Approved/Denied:   PASRR Number: [id number]  Discharge Plan: SNF    Current Diagnoses: Patient Active Problem List   Diagnosis Date Noted  . Fall   . Intertrochanteric fracture of right femur ([place]) 06/11/2019  . Femur fracture, right ([place]) 06/11/2019  . Hip pain 12/23/2018  . Right ankle pain 12/23/2018  . Mixed hyperlipidemia 04/29/2017  . Lower extremity edema 12/24/2016  . Fatigue 12/24/2016  . Current use of long term anticoagulation 09/17/2016  . PAF (paroxysmal atrial fibrillation) ([place]) 08/13/2016  . Seizure ([place])   . Syncope and collapse   . Syncope 11/21/2015  . ARF (acute renal failure) ([place]) 10/26/2015  . Laceration of right lower leg 09/28/2015  . Bursitis of hip 08/27/2015  . Senile purpura ([place]) 01/01/2015  . Edema 08/15/2014  . Arthritis 07/12/2014  . Basal cell carcinoma 07/12/2014  . CC (collagenous colitis) 07/12/2014  . Essential hypertension 07/12/2014  . Hypercholesteremia 07/12/2014  . Adiposity 07/12/2014  . Acne erythematosa 07/12/2014  . Diabetes mellitus, type 2 ([place]) 07/12/2014  . 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 Avitaminosis D 07/12/2014  . H/O malignant neoplasm of skin 10/10/2011    Orientation RESPIRATION BLADDER Height & Weight     Self, Time, Situation, Place  Normal Continent Weight: 71.7 kg Height:  5\' 3"  (160 cm)  BEHAVIORAL  SYMPTOMS/MOOD NEUROLOGICAL BOWEL NUTRITION STATUS      Continent    AMBULATORY STATUS COMMUNICATION OF NEEDS Skin   Extensive Assist   Surgical wounds                       Personal Care Assistance Level of Assistance  Bathing, Dressing Bathing Assistance: Limited assistance   Dressing Assistance: Limited assistance     Functional Limitations Info             SPECIAL CARE FACTORS FREQUENCY  PT (By licensed PT)     PT Frequency: 5 times per week              Contractures      Additional Factors Info                  Current Medications (06/14/2019):  This is the current hospital active medication list Current Facility-Administered Medications  Medication Dose Route Frequency Provider Last Rate Last Admin  . 0.9 %  sodium chloride infusion   Intravenous Continuous Hessie Knows, MD   Stopped at 06/14/19 [phone]  . 0.9 %  sodium chloride infusion   Intravenous Continuous Hessie Knows, MD   Stopped at 06/14/19 [phone]  . acetaminophen (TYLENOL) tablet 1,000 mg  1,000 mg Oral BID PRN Hessie Knows, MD   1,000 mg at 06/14/19 1351  . alum & mag hydroxide-simeth (MAALOX/MYLANTA) 200-200-20 MG/5ML suspension 30 mL  30 mL Oral Q4H PRN Hessie Knows, MD      . apixaban (ELIQUIS) tablet 5 mg  5 mg Oral BID  Hessie Knows, MD   5 mg at 06/14/19 0843  . bisacodyl (DULCOLAX) suppository 10 mg  10 mg Rectal Daily PRN Hessie Knows, MD      . cholecalciferol (VITAMIN D3) tablet 2,000 Units  2,000 Units Oral Daily Hessie Knows, MD   2,000 Units at 06/14/19 [phone]  . docusate sodium (COLACE) capsule 100 mg  100 mg Oral BID Hessie Knows, MD   100 mg at 06/14/19 0842  . feeding supplement (NEPRO CARB STEADY) liquid 237 mL  237 mL Oral Q24H Hessie Knows, MD   237 mL at 06/14/19 0844  . furosemide (LASIX) tablet 20 mg  20 mg Oral Daily PRN Hessie Knows, MD      . HYDROcodone-acetaminophen (NORCO/VICODIN) 5-325 MG per tablet 1-2 tablet  1-2 tablet Oral Q6H PRN Hessie Knows, MD   2  tablet at 06/14/19 [phone]  . insulin aspart (novoLOG) injection 0-15 Units  0-15 Units Subcutaneous TID WC Hessie Knows, MD   3 Units at 06/14/19 1352  . insulin aspart (novoLOG) injection 0-5 Units  0-5 Units Subcutaneous QHS Hessie Knows, MD   3 Units at 06/13/19 2047  . lidocaine (LIDODERM) 5 % 1 patch  1 patch Transdermal Q24H Lorella Nimrod, MD      . lisinopril (ZESTRIL) tablet 5 mg  5 mg Oral Daily Hessie Knows, MD   5 mg at 06/14/19 0843  . magnesium citrate solution 1 Bottle  1 Bottle Oral Once PRN Hessie Knows, MD      . magnesium hydroxide (MILK OF MAGNESIA) suspension 30 mL  30 mL Oral Daily PRN Hessie Knows, MD      . menthol-cetylpyridinium (CEPACOL) lozenge 3 mg  1 lozenge Oral PRN Hessie Knows, MD       Or  . phenol (CHLORASEPTIC) mouth spray 1 spray  1 spray Mouth/Throat PRN Hessie Knows, MD      . methocarbamol (ROBAXIN) tablet 500 mg  500 mg Oral Q6H PRN Hessie Knows, MD   500 mg at 06/11/19 1757  . metoCLOPramide (REGLAN) tablet 5-10 mg  5-10 mg Oral Q8H PRN Hessie Knows, MD       Or  . metoCLOPramide (REGLAN) injection 5-10 mg  5-10 mg Intravenous Q8H PRN Hessie Knows, MD      . metoprolol succinate (TOPROL-XL) 24 hr tablet 25 mg  25 mg Oral Daily Hessie Knows, MD   25 mg at 06/14/19 0843  . morphine 2 MG/ML injection 1-2 mg  1-2 mg Intravenous Q2H PRN Hessie Knows, MD   2 mg at 06/12/19 1636  . multivitamin with minerals tablet 1 tablet  1 tablet Oral Daily Hessie Knows, MD   1 tablet at 06/14/19 [phone]  . nitroGLYCERIN (NITROSTAT) SL tablet 0.4 mg  0.4 mg Sublingual Q5 min PRN Lorella Nimrod, MD   0.4 mg at 06/14/19 1412  . ondansetron (ZOFRAN) tablet 4 mg  4 mg Oral Q6H PRN Hessie Knows, MD   4 mg at 06/14/19 1402   Or  . ondansetron (ZOFRAN) injection 4 mg  4 mg Intravenous Q6H PRN Hessie Knows, MD      . simvastatin (ZOCOR) tablet 20 mg  20 mg Oral QHS Hessie Knows, MD   20 mg at 06/13/19 2045     Discharge Medications: Please see discharge summary for a  list of discharge medications.  Relevant Imaging Results:  Relevant Lab Results:   Additional Information SS# [id number]  Su Hilt, RN

## 2019-06-14 NOTE — Progress Notes (Signed)
PROGRESS NOTE    [name]  [id number] DOB: May 11, 1931 DOA: 06/11/2019 PCP: Margo Common, PA   Brief Narrative:  86 year old female with history of hypertension, paroxysmal A. fib on Eliquis, diabetes mellitus, recent left hip abductor repair 6 weeks back who presented after a mechanical fall landing on her right hip and sustained minimally displaced intertrochanteric right femur fracture. Seen by orthopedic surgery and she was taken to the OR for ORIF on 06/13/2019 after clearing of her Eliquis.  Eliquis resumed postoperatively.  Subjective: Patient was complaining of left-sided chest pain, mostly at the base of her left breast, nonradiating, no relationship with movement or breathing.  She did experience mild nausea in the beginning of pain with no vomiting.  Nausea subsides spontaneously without any intervention.  Denies any shortness of breath.  Denies any prior CAD.  Assessment & Plan:   Principal Problem:   Intertrochanteric fracture of right femur (HCC) Active Problems:   Essential hypertension   Diabetes mellitus, type 2 (HCC)   PAF (paroxysmal atrial fibrillation) (HCC)   Femur fracture, right ([place])   Fall  Chest pain.  Patient has very nonspecific chest pain.  No chest wall tenderness. EKG without any acute change.  Troponin 86>>89.  No prior troponin in the system to compare.  Most likely secondary to demand as patient has surgery yesterday.  Echocardiogram done in February 2021 was normal. She was given Toradol x1 and nitroglycerin without much relief. -We will trend troponin-if continue to trend up then we will involve cardiology. -We will try lidocaine patch. -Continue with pain management.  Intertrochanteric fracture of right femur ([place]) Secondary to mechanical fall. Had ORIF yesterday, tolerated the procedure well..  Patient was on Eliquis which was held since admission. -Continue Pain control with as needed oral Vicodin and IV morphine for severe pain.   -Continue IV Robaxin as needed for muscle spasms.  -Continue Bowel regimen with constipation secondary to opioids. -Resume Eliquis.  PAF (paroxysmal atrial fibrillation) (HCC) Rate controlled.  - Continue Toprol and maintain perioperative beta-blockade.   -Eliquis discontinued for surgery. - Resume Eliquis.    Essential hypertension.  Blood pressure within goal. - Continue lisinopril and metoprolol.    Diabetes mellitus, type 2, controlled without complication (HCC) 123456 of 7.  On glipizide at home which is held.  - Stable on sliding scale coverage.  Dyslipidemia Continue statin.   Objective: Vitals:   06/14/19 0031 06/14/19 0423 06/14/19 0808 06/14/19 1136  BP: 136/62 (!) 136/56 121/62 139/78  Pulse: 79 71 69 84  Resp: 16 16 16 18   Temp: (!) 97.5 F (36.4 C) 97.7 F (36.5 C) 98 F (36.7 C) 98.3 F (36.8 C)  TempSrc: Oral Oral Oral Oral  SpO2: 98% 98% 98% 95%  Weight:      Height:        Intake/Output Summary (Last 24 hours) at 06/14/2019 1454 Last data filed at 06/14/2019 0959 Gross per 24 hour  Intake 726.31 ml  Output 550 ml  Net 176.31 ml   Filed Weights   06/11/19 1359  Weight: 71.7 kg    Examination:  General exam: Pleasant elderly lady, appears calm and comfortable  Respiratory system: Clear to auscultation. Respiratory effort normal. Cardiovascular system: S1 & S2 heard, RRR. No JVD, murmurs, rubs, gallops or clicks. Gastrointestinal system: Soft, nontender, nondistended, bowel sounds positive. Central nervous system: Alert and oriented. No focal neurological deficits.Symmetric 5 x 5 power. Extremities: No edema, no cyanosis, pulses intact and symmetrical. Psychiatry: Judgement and  insight appear normal.    DVT prophylaxis:  Eliquis  Code Status: Full Family Communication: Daughter was contacted via phone and left a message. Disposition Plan: Pending improvement after surgery, new nonspecific chest pain today which needs  more work-up.  Trending  troponin, currently barely positive if continue to trend up then she will need cardiology evaluation.  Patient will go to SNF.  Had a bed offer from [facility] from tomorrow.  Covid testing ordered.  Consultants:   Orthopedic.  Procedures:  Antimicrobials:   Data Reviewed: I have personally reviewed following labs and imaging studies  CBC: Recent Labs  Lab 06/11/19 1513 06/12/19 0446 06/14/19 0458  WBC 9.1 10.5 14.8*  HGB 12.4 12.0 11.5*  HCT 36.7 36.4 35.4*  MCV 90.4 91.9 91.9  PLT 240 246 [id number]   Basic Metabolic Panel: Recent Labs  Lab 06/11/19 1513 06/11/19 1805 06/12/19 0446 06/14/19 0458  NA 142 141 140 138  K 4.5 4.4 4.8 4.9  CL 107 108 106 104  CO2 27 25 26 24   GLUCOSE 102* 152* 134* 278*  BUN 27* 25* 25* 26*  CREATININE 0.79 0.63 0.90 0.76  CALCIUM 9.3 9.0 9.2 8.5*   GFR: Estimated Creatinine Clearance: 48.8 mL/min (by C-G formula based on SCr of 0.76 mg/dL). Liver Function Tests: No results for input(s): AST, ALT, ALKPHOS, BILITOT, PROT, ALBUMIN in the last 168 hours. No results for input(s): LIPASE, AMYLASE in the last 168 hours. No results for input(s): AMMONIA in the last 168 hours. Coagulation Profile: Recent Labs  Lab 06/11/19 1513  INR 1.3*   Cardiac Enzymes: No results for input(s): CKTOTAL, CKMB, CKMBINDEX, TROPONINI in the last 168 hours. BNP (last 3 results) No results for input(s): PROBNP in the last 8760 hours. HbA1C: Recent Labs    06/11/19 1513  HGBA1C 7.0*   CBG: Recent Labs  Lab 06/13/19 1421 06/13/19 1630 06/13/19 2033 06/14/19 0805 06/14/19 1133  GLUCAP 182* 213* 275* 223* 198*   Lipid Profile: No results for input(s): CHOL, HDL, LDLCALC, TRIG, CHOLHDL, LDLDIRECT in the last 72 hours. Thyroid Function Tests: No results for input(s): TSH, T4TOTAL, FREET4, T3FREE, THYROIDAB in the last 72 hours. Anemia Panel: No results for input(s): VITAMINB12, FOLATE, FERRITIN, TIBC, IRON, RETICCTPCT in the last 72 hours. Sepsis  Labs: No results for input(s): PROCALCITON, LATICACIDVEN in the last 168 hours.  Recent Results (from the past 240 hour(s))  Respiratory Panel by RT PCR (Flu A&B, Covid) - Nasopharyngeal Swab     Status: None   Collection Time: 06/11/19  3:41 PM   Specimen: Nasopharyngeal Swab  Result Value Ref Range Status   SARS Coronavirus 2 by RT PCR NEGATIVE NEGATIVE Final    Comment: (NOTE) SARS-CoV-2 target nucleic acids are NOT DETECTED. The SARS-CoV-2 RNA is generally detectable in upper respiratoy specimens during the acute phase of infection. The lowest concentration of SARS-CoV-2 viral copies this assay can detect is 131 copies/mL. A negative result does not preclude SARS-Cov-2 infection and should not be used as the sole basis for treatment or other patient management decisions. A negative result may occur with  improper specimen collection/handling, submission of specimen other than nasopharyngeal swab, presence of viral mutation(s) within the areas targeted by this assay, and inadequate number of viral copies (<131 copies/mL). A negative result must be combined with clinical observations, patient history, and epidemiological information. The expected result is Negative. Fact Sheet for Patients:  PinkCheek.be Fact Sheet for Healthcare Providers:  GravelBags.it This test is not yet ap proved  or cleared by the [place] and  has been authorized for detection and/or diagnosis of SARS-CoV-2 by FDA under an Emergency Use Authorization (EUA). This EUA will remain  in effect (meaning this test can be used) for the duration of the COVID-19 declaration under Section 564(b)(1) of the Act, 21 U.S.C. section 360bbb-3(b)(1), unless the authorization is terminated or revoked sooner.    Influenza A by PCR NEGATIVE NEGATIVE Final   Influenza B by PCR NEGATIVE NEGATIVE Final    Comment: (NOTE) The Xpert Xpress SARS-CoV-2/FLU/RSV assay  is intended as an aid in  the diagnosis of influenza from Nasopharyngeal swab specimens and  should not be used as a sole basis for treatment. Nasal washings and  aspirates are unacceptable for Xpert Xpress SARS-CoV-2/FLU/RSV  testing. Fact Sheet for Patients: PinkCheek.be Fact Sheet for Healthcare Providers: GravelBags.it This test is not yet approved or cleared by the [place] FDA and  has been authorized for detection and/or diagnosis of SARS-CoV-2 by  FDA under an Emergency Use Authorization (EUA). This EUA will remain  in effect (meaning this test can be used) for the duration of the  Covid-19 declaration under Section 564(b)(1) of the Act, 21  U.S.C. section 360bbb-3(b)(1), unless the authorization is  terminated or revoked. Performed at [facility], [address] [place], [place] [address]   MRSA PCR Screening     Status: None   Collection Time: 06/12/19  2:24 PM   Specimen: Nasal Mucosa; Nasopharyngeal  Result Value Ref Range Status   MRSA by PCR NEGATIVE NEGATIVE Final    Comment:        The GeneXpert MRSA Assay (FDA approved for NASAL specimens only), is one component of a comprehensive MRSA colonization surveillance program. It is not intended to diagnose MRSA infection nor to guide or monitor treatment for MRSA infections. Performed at [facility], [address] [place], [place] [address]      Radiology Studies: DG HIP OPERATIVE UNILAT W OR W/O PELVIS RIGHT  Result Date: 06/13/2019 CLINICAL DATA:  Proximal right femoral fracture EXAM: OPERATIVE RIGHT HIP WITH PELVIS COMPARISON:  06/11/2019 FLUOROSCOPY TIME:  Radiation Exposure Index (as provided by the fluoroscopic device): Not available If the device does not provide the exposure index: Fluoroscopy Time:  36 seconds Number of Acquired Images:  4 FINDINGS: Medullary rod is noted with proximal fixation screw and distal fixation  screw. The fracture fragments are in near anatomic alignment. IMPRESSION: ORIF of proximal right femoral fracture Electronically Signed   By: Inez Catalina M.D.   On: 06/13/2019 14:12    Scheduled Meds: . apixaban  5 mg Oral BID  . cholecalciferol  2,000 Units Oral Daily  . docusate sodium  100 mg Oral BID  . feeding supplement (NEPRO CARB STEADY)  237 mL Oral Q24H  . insulin aspart  0-15 Units Subcutaneous TID WC  . insulin aspart  0-5 Units Subcutaneous QHS  . lidocaine  1 patch Transdermal Q24H  . lisinopril  5 mg Oral Daily  . metoprolol succinate  25 mg Oral Daily  . multivitamin with minerals  1 tablet Oral Daily  . simvastatin  20 mg Oral QHS   Continuous Infusions: . sodium chloride Stopped (06/14/19 0843)  . sodium chloride Stopped (06/14/19 0843)     LOS: 3 days   Time spent: 40 minutes.  Lorella Nimrod, MD [facility]  If 7PM-7AM, please contact night-coverage [email or web address]  06/14/2019, 2:54 PM   This record has been created using  [employer] forces training and education officer. Errors have been sought and corrected,but may not always be located. Such creation errors do not reflect on the standard of care.

## 2019-06-14 NOTE — Progress Notes (Signed)
Physical Therapy Treatment Patient Details Name: [name] MRN: [id number] DOB: [birth year] Today's Date: 06/14/2019    History of Present Illness [name] is a 86 y.o. female with medical history significant for hypertension, diabetes mellitus, atrial fibrillation, recent left hip abductor tendon repair who was brought into the emergency room by EMS after a non-syncopal fall at home.  Patient states that she tripped on her slippers and fell on her right hip.  She is now s/p R IM nail intertrochantric.    PT Comments    Pt is laying in bed in good spirits, but not overly eager to do a lot of mobility this afternoon - she did do poorly with mobility this AM and she and daughter agree that resting this afternoon would be more prudent.  She was willing and able to do exercises, and performed and tolerated them much better than this AM. Pt continues to c/o more breast pain than R hip pain, though lidocaine patch seemed to help with this.  Pt motivated to try more mobility tomorrow and get to the recliner, likely will be +2 assist to do so safely.  Follow Up Recommendations  SNF     Equipment Recommendations  None recommended by PT    Recommendations for Other Services       Precautions / Restrictions Precautions Precautions: Fall Precaution Comments: High Fall Restrictions Weight Bearing Restrictions: Yes RLE Weight Bearing: Weight bearing as tolerated    Mobility  Bed Mobility               General bed mobility comments: deferred secondary to pt discomfort and desire to rest, as well discussion about how difficult mobility was this AM  Transfers                    Ambulation/Gait                 Stairs             Wheelchair Mobility    Modified Rankin (Stroke Patients Only)       Balance                                            Cognition Arousal/Alertness: Awake/alert Behavior During Therapy: WFL for  tasks assessed/performed Overall Cognitive Status: Within Functional Limits for tasks assessed                                        Exercises General Exercises - Lower Extremity Ankle Circles/Pumps: AROM;10 reps Quad Sets: Strengthening;10 reps(much better tolerance on R this afternoon) Short Arc Quad: Strengthening;10 reps;Both;AROM Heel Slides: 10 reps;AAROM;AROM;Strengthening(resisted leg extensions, better overall tolerance this PM) Hip ABduction/ADduction: AROM;AAROM;10 reps    General Comments        Pertinent Vitals/Pain Pain Assessment: 0-10 Pain Score: 8  Pain Location: R hip, L breast    Home Living                      Prior Function            PT Goals (current goals can now be found in the care plan section) Progress towards PT goals: Progressing toward goals    Frequency    BID  PT Plan Current plan remains appropriate    Co-evaluation              AM-PAC PT "6 Clicks" Mobility   Outcome Measure  Help needed turning from your back to your side while in a flat bed without using bedrails?: A Lot Help needed moving from lying on your back to sitting on the side of a flat bed without using bedrails?: Total Help needed moving to and from a bed to a chair (including a wheelchair)?: Total Help needed standing up from a chair using your arms (e.g., wheelchair or bedside chair)?: Total Help needed to walk in hospital room?: Total Help needed climbing 3-5 steps with a railing? : Total 6 Click Score: 7    End of Session Equipment Utilized During Treatment: Gait belt;Oxygen Activity Tolerance: Patient limited by pain;Patient limited by fatigue Patient left: in bed;with call bell/phone within reach Nurse Communication: Mobility status PT Visit Diagnosis: Muscle weakness (generalized) (M62.81);Difficulty in walking, not elsewhere classified (R26.2);Pain Pain - Right/Left: Right Pain - part of body: Hip     Time:  [id number] PT Time Calculation (min) (ACUTE ONLY): 33 min  Charges:  $Therapeutic Exercise: 23-37 mins                     Kreg Shropshire, DPT 06/14/2019, 6:00 PM

## 2019-06-14 NOTE — Progress Notes (Signed)
   Subjective: 1 Day Post-Op Procedure(s) (LRB): INTRAMEDULLARY (IM) NAIL INTERTROCHANTRIC (Right) Patient reports pain as mild.   Patient is well, and has had no acute complaints or problems Denies any CP, SOB, ABD pain. We will start physical therapy today.    Objective: Vital signs in last 24 hours: Temp:  [97.3 F (36.3 C)-99.9 F (37.7 C)] 98 F (36.7 C) (04/06 0808) Pulse Rate:  [69-95] 69 (04/06 0808) Resp:  [16-26] 16 (04/06 0808) BP: (116-182)/(56-96) 121/62 (04/06 0808) SpO2:  [80 %-98 %] 98 % (04/06 0808)  Intake/Output from previous day: 04/05 0701 - 04/06 0700 In: 986.3 [P.O.:120; I.V.:816.3; IV Piggyback:50] Out: 15 [Blood:15] Intake/Output this shift: No intake/output data recorded.  Recent Labs    06/11/19 1513 06/12/19 0446 06/14/19 0458  HGB 12.4 12.0 11.5*   Recent Labs    06/12/19 0446 06/14/19 0458  WBC 10.5 14.8*  RBC 3.96 3.85*  HCT 36.4 35.4*  PLT 246 207   Recent Labs    06/12/19 0446 06/14/19 0458  NA 140 138  K 4.8 4.9  CL 106 104  CO2 26 24  BUN 25* 26*  CREATININE 0.90 0.76  GLUCOSE 134* 278*  CALCIUM 9.2 8.5*   Recent Labs    06/11/19 1513  INR 1.3*    EXAM General - Patient is Alert, Appropriate and Oriented Extremity - Neurovascular intact Sensation intact distally Intact pulses distally Dorsiflexion/Plantar flexion intact No cellulitis present Compartment soft Dressing - dressing C/D/I and no drainage Motor Function - intact, moving foot and toes well on exam.   Past Medical History:  Diagnosis Date  . Chronic diarrhea   . Collagenous colitis   . Diabetes mellitus without complication ([place])   . Diverticulitis   . Heart murmur    at birth, none since  . History of echocardiogram    a. 11/2015: echo showing EF of 60-65% with no WMA. Grade 1 DD and mild MR noted.   . Hypertension   . PAF (paroxysmal atrial fibrillation) ([place])    a. initially diagnosed in 08/2016 --> started on Eliquis  . Syncope    a. initially occurring in Fall 2016 b. Hospitalized in 10/2015 and 11/2015 for recurrent episodes.     Assessment/Plan:   1 Day Post-Op Procedure(s) (LRB): INTRAMEDULLARY (IM) NAIL INTERTROCHANTRIC (Right) Principal Problem:   Intertrochanteric fracture of right femur (HCC) Active Problems:   Essential hypertension   Diabetes mellitus, type 2 (HCC)   PAF (paroxysmal atrial fibrillation) (HCC)   Femur fracture, right ([place])   Fall  Estimated body mass index is 27.99 kg/m as calculated from the following:   Height as of this encounter: 5\' 3"  (1.6 m).   Weight as of this encounter: 71.7 kg. Advance diet Up with therapy  Needs bowel movement Labs stable Vital signs stable Pain well controlled Care manager to assist with discharge   Follow-up with [facility] orthopedics in 2 weeks for staple removal and Steri-Strip application. Continue with Eliquis TED hose bilateral lower extremities x6 weeks   DVT Prophylaxis - TED hose and SCDs, Eliquis Weight-Bearing as tolerated to right leg   T. Rachelle Hora, PA-C [place] 06/14/2019, 8:21 AM

## 2019-06-14 NOTE — Evaluation (Signed)
Physical Therapy Evaluation Patient Details Name: CEONNA MECKEL MRN: [id number] DOB: [birth year] Today's Date: 06/14/2019   History of Present Illness  86 y/o female here after fall at home with R hip fx.  Subsequent IM nailing 4/5, h/o L hip scope by same surgeon ~1 month ago.  Clinical Impression  Pt hesitant to do a lot with PT secondary to pain.  She initially hyper-reactive to any R LE exercises, but with increased cuing and acknowledging the importance of trying to move she was able to tolerate mobility to/from EOB (with +2 heavy assist, but no calling out in pain) and even showed good effort with trying to get to standing, though she ultimately required considerable assist just to maintain upright and had knees buckling and inability to attain full standing posture despite +2 assist.  Pt showed good effort but is functionally quite limited, will require STR to get back to a more independent and functional status.      Follow Up Recommendations SNF    Equipment Recommendations  None recommended by PT    Recommendations for Other Services       Precautions / Restrictions Restrictions Weight Bearing Restrictions: Yes RLE Weight Bearing: Weight bearing as tolerated      Mobility  Bed Mobility Overal bed mobility: Needs Assistance Bed Mobility: Supine to Sit     Supine to sit: Mod assist;Max assist     General bed mobility comments: Pt able to assist minimally with UEs, heavy assist with getting trunk upright and assisting LEs to EOB  Transfers Overall transfer level: Needs assistance Equipment used: Rolling walker (2 wheeled) Transfers: Sit to/from Stand Sit to Stand: +2 physical assistance;Max assist         General transfer comment: 2 attempts at standing with poor overall tolerance.  Elevated bed 2-3" and heavy cuing for set up and actual assist.  Unable to put weight through UEs/AD effectively to be able to maintain standing.  Low grade buckling and considerable  +2 assist to breifly maintain standing.  Overall good effort but unable to maintain standing and unsafe to try any steps/weight shift/ etc  Ambulation/Gait             General Gait Details: unable/unsafe  Stairs            Wheelchair Mobility    Modified Rankin (Stroke Patients Only)       Balance Overall balance assessment: Needs assistance Sitting-balance support: Bilateral upper extremity supported Sitting balance-Leahy Scale: Fair     Standing balance support: Bilateral upper extremity supported Standing balance-Leahy Scale: Poor Standing balance comment: needed heavy assist just to maintain upright at EOB, no tolerance and need for heavy assist                             Pertinent Vitals/Pain Pain Assessment: 0-10 Pain Score: 3 (very increased with R LE movement, c/o L breast pain t/o) Pain Location: R hip, L breast    Home Living Family/patient expects to be discharged to:: Skilled nursing facility Living Arrangements: Alone Available Help at Discharge: Family Type of Home: House Home Access: Stairs to enter   [employer] of Steps: 2 steps to garage, 4-5 off deck, 1 into sun room   Home Equipment: Walker - 2 wheels;Cane - single point;Grab bars - tub/shower;Grab bars - toilet      Prior Function Level of Independence: Needs assistance   Gait / Transfers Assistance Needed: Pt was  only occasionally using a cane ~1 month ago (pre scope) has been using RW recently           Hand Dominance        Extremity/Trunk Assessment   Upper Extremity Assessment Upper Extremity Assessment: Generalized weakness(age appropriate limitations)    Lower Extremity Assessment Lower Extremity Assessment: Generalized weakness(pain limited on R, ~3/5 on L, <3/5 on R)       Communication   Communication: No difficulties  Cognition Arousal/Alertness: Awake/alert Behavior During Therapy: WFL for tasks assessed/performed Overall  Cognitive Status: Within Functional Limits for tasks assessed                                        General Comments General comments (skin integrity, edema, etc.): Pt on room air on arrival, sats in the low 90s, mid/upper 90s on 2L     Exercises General Exercises - Lower Extremity Ankle Circles/Pumps: AROM;10 reps Quad Sets: Strengthening;10 reps(c/o significant pain with even minimal resistance) Heel Slides: AAROM;5 reps(poor tolerance on R 2/2 pain, resisted leg extensions) Hip ABduction/ADduction: AAROM;5 reps(poor tolerance on R 2/2 pain)   Assessment/Plan    PT Assessment Patient needs continued PT services  PT Problem List Decreased strength;Decreased range of motion;Decreased activity tolerance;Decreased balance;Decreased mobility;Decreased coordination;Decreased knowledge of use of DME;Decreased safety awareness;Pain       PT Treatment Interventions DME instruction;Gait training;Functional mobility training;Therapeutic activities;Therapeutic exercise;Balance training;Neuromuscular re-education;Patient/family education    PT Goals (Current goals can be found in the Care Plan section)  Acute Rehab PT Goals Patient Stated Goal: get back to walking, control pain PT Goal Formulation: With patient Time For Goal Achievement: 06/28/19 Potential to Achieve Goals: Fair    Frequency BID   Barriers to discharge        Co-evaluation PT/OT/SLP Co-Evaluation/Treatment: Yes Reason for Co-Treatment: For patient/therapist safety PT goals addressed during session: Mobility/safety with mobility;Proper use of DME;Strengthening/ROM;Balance         AM-PAC PT "6 Clicks" Mobility  Outcome Measure Help needed turning from your back to your side while in a flat bed without using bedrails?: A Lot Help needed moving from lying on your back to sitting on the side of a flat bed without using bedrails?: Total Help needed moving to and from a bed to a chair (including a  wheelchair)?: Total Help needed standing up from a chair using your arms (e.g., wheelchair or bedside chair)?: Total Help needed to walk in hospital room?: Total Help needed climbing 3-5 steps with a railing? : Total 6 Click Score: 7    End of Session Equipment Utilized During Treatment: Gait belt;Oxygen Activity Tolerance: Patient limited by pain;Patient limited by fatigue Patient left: in bed;with call bell/phone within reach(OT working with pt) Nurse Communication: Mobility status(breast pain and need for new purewick) PT Visit Diagnosis: Muscle weakness (generalized) (M62.81);Difficulty in walking, not elsewhere classified (R26.2);Pain Pain - Right/Left: Right Pain - part of body: Hip    Time: [id number] PT Time Calculation (min) (ACUTE ONLY): 47 min   Charges:   PT Evaluation $PT Eval Low Complexity: 1 Low PT Treatments $Therapeutic Exercise: 8-22 mins        Kreg Shropshire, DPT 06/14/2019, 10:51 AM

## 2019-06-14 NOTE — Care Management Important Message (Signed)
Important Message  Patient Details  Name: [name] MRN: [id number] Date of Birth: May 14, 1931   Medicare Important Message Given:  Yes     Juliann Pulse A Ryota Treece 06/14/2019, 10:40 AM

## 2019-06-14 NOTE — Anesthesia Postprocedure Evaluation (Signed)
Anesthesia Post Note  [name]  Procedure(s) Performed: INTRAMEDULLARY (IM) NAIL INTERTROCHANTRIC (Right )  Patient location during evaluation: PACU Anesthesia Type: General Level of consciousness: awake and alert Pain management: pain level controlled Vital Signs Assessment: post-procedure vital signs reviewed and stable Respiratory status: spontaneous breathing, nonlabored ventilation, respiratory function stable and patient connected to nasal cannula oxygen Cardiovascular status: blood pressure returned to baseline and stable Postop Assessment: no apparent nausea or vomiting Anesthetic complications: no     Last Vitals:  Vitals:   06/14/19 0423 06/14/19 0808  BP: (!) 136/56 121/62  Pulse: 71 69  Resp: 16 16  Temp: 36.5 C 36.7 C  SpO2: 98% 98%    Last Pain:  Vitals:   06/14/19 0808  TempSrc: Oral  PainSc:                  Martha Clan

## 2019-06-14 NOTE — Progress Notes (Deleted)
Patient by nursing staff with concern of some left-sided weakness and altered mental status.  Evaluated the patient at bedside with no focal neurologic deficit. Daughter was at bedside who was concerned that she is not as sharp as yesterday. CT head obtained which was negative for any acute infarct.  Most likely secondary to oxycodone use due to pain and some sundowning which is very common in her age. We will continue to monitor.

## 2019-06-14 NOTE — Progress Notes (Signed)
Writer received called from lab that patient's troponin level is now 105 which has increased from 98. MD paged with lab values awaiting call back.

## 2019-06-14 NOTE — Evaluation (Signed)
Occupational Therapy Evaluation Patient Details Name: [name] MRN: [id number] DOB: 1931/03/15 Today's Date: 06/14/2019    History of Present Illness [name] is a 86 y.o. female with medical history significant for hypertension, diabetes mellitus, atrial fibrillation, recent left hip abductor tendon repair who was brought into the emergency room by EMS after a non-syncopal fall at home.  Patient states that she tripped on her slippers and fell on her right hip.  She is now s/p R IM nail intertrochantric.   Clinical Impression   [name] was seen for OT evaluation this date, POD#1 from above surgery. Pt was modified independent in all ADLs prior to surgery, however she reports using [facility] for mobility since her recent L hip procedure. Pt is eager to return to PLOF with less pain and improved safety and independence. Pt currently requires maximial assistance +2 for fxl mobility and transfer attempts this date as well as modreate assist for LB dressing and bathing while in seated position due to pain and limited AROM of R hip. Pt unable to tolerate fxl mobility attempts. Requires max assist +2 for sup<>sit transfers in bed. Pt instructed in self care skills, falls prevention strategies, home/routines modifications, DME/AE for LB bathing and dressing tasks, and compression stocking mgt strategies. Pt would benefit from additional instruction in self care skills and techniques to help maintain precautions with or without assistive devices to support recall and carryover prior to discharge. Recommend STR upon hospital DC.     Follow Up Recommendations  SNF    Equipment Recommendations  3 in 1 bedside commode    Recommendations for Other Services       Precautions / Restrictions Precautions Precautions: Fall Precaution Comments: High Fall Restrictions Weight Bearing Restrictions: Yes RLE Weight Bearing: Weight bearing as tolerated      Mobility Bed Mobility Overal bed  mobility: Needs Assistance Bed Mobility: Supine to Sit     Supine to sit: Mod assist;Max assist     General bed mobility comments: Pt able to assist minimally with UEs, heavy assist with getting trunk upright and assisting LEs to EOB  Transfers Overall transfer level: Needs assistance Equipment used: Rolling walker (2 wheeled) Transfers: Sit to/from Stand Sit to Stand: +2 physical assistance;Max assist         General transfer comment: 2 attempts at standing with poor overall tolerance.  Elevated bed 2-3" and heavy cuing for set up and actual assist.  Unable to put weight through UEs/AD effectively to be able to maintain standing.  Low grade buckling and considerable +2 assist to breifly maintain standing.  Overall good effort but unable to maintain standing and unsafe to try any steps/weight shift/ etc    Balance Overall balance assessment: Needs assistance Sitting-balance support: Bilateral upper extremity supported Sitting balance-Leahy Scale: Fair Sitting balance - Comments: Steady static sitting, reaching within BOS   Standing balance support: Bilateral upper extremity supported Standing balance-Leahy Scale: Poor Standing balance comment: needed heavy assist just to maintain upright at EOB, no tolerance and need for heavy assist                           ADL either performed or assessed with clinical judgement   ADL Overall ADL's : Needs assistance/impaired  Functional mobility during ADLs: +2 for physical assistance;Maximal assistance;Cueing for safety;Rolling walker General ADL Comments: Pt is functionally limited by generalized weakness, decreased activity tolerance, increased pain in RLE and decreased ROM in RLE. She requires +2 max assist for fxl mobility/transfers this date. Mod/Max assist for LB ADL management in seated position. Set-up for UB ADL including grooming and feeding in seated position or at bed  level. Pt unable to tolerate t/f this date 2/2 pain currently bed level for toileting/peri-care     Vision Baseline Vision/History: Wears glasses;Cataracts(Pt reports she has L cataract req sx.) Wears Glasses: At all times Patient Visual Report: No change from baseline       Perception     Praxis      Pertinent Vitals/Pain Pain Assessment: 0-10 Pain Score: 6  Pain Location: R hip, L breast Pain Descriptors / Indicators: Sore;Discomfort;Grimacing Pain Intervention(s): Limited activity within patient's tolerance;Monitored during session;Repositioned;Utilized relaxation techniques;Patient requesting pain meds-RN notified     Hand Dominance     Extremity/Trunk Assessment Upper Extremity Assessment Upper Extremity Assessment: Generalized weakness;Overall WFL for tasks assessed   Lower Extremity Assessment Lower Extremity Assessment: Generalized weakness;RLE deficits/detail RLE Deficits / Details: s/p R IM nail RLE: Unable to fully assess due to pain RLE Coordination: decreased gross motor       Communication Communication Communication: No difficulties   Cognition Arousal/Alertness: Awake/alert Behavior During Therapy: WFL for tasks assessed/performed Overall Cognitive Status: Within Functional Limits for tasks assessed                                     General Comments  Pt maintains sats in mid 90's t/o session. Left on 1 L [place] spO2 of 95%. RN notified.    Exercises Exercises: General Lower Extremity General Exercises - Lower Extremity Ankle Circles/Pumps: AROM;10 reps Quad Sets: Strengthening;10 reps(c/o significant pain with even minimal resistance) Heel Slides: AAROM;5 reps(poor tolerance on R 2/2 pain, resisted leg extensions) Hip ABduction/ADduction: AAROM;5 reps(poor tolerance on R 2/2 pain) Other Exercises Other Exercises: Pt educated on falls prevention strategies for home and hospital, safe use of AE for LB ADL (limited this date),  complementary alternative methods for pain management, compression stocking managment, and routines modifications to support safety and fxl independence upon hospital DC.   Shoulder Instructions      Home Living Family/patient expects to be discharged to:: Skilled nursing facility Living Arrangements: Alone Available Help at Discharge: Family Type of Home: House Home Access: Stairs to enter [employer] of Steps: 2 steps to garage, 4-5 off deck, 1 into sun room Entrance Stairs-Rails: Right;Left;Can reach both       Bathroom Shower/Tub: Walk-in shower         Home Equipment: Environmental consultant - 2 wheels;Cane - single point;Grab bars - tub/shower;Grab bars - toilet          Prior Functioning/Environment Level of Independence: Needs assistance  Gait / Transfers Assistance Needed: Pt was only occasionally using a cane ~1 month ago (pre scope) has been using RW recently              OT Problem List: Decreased strength;Pain;Decreased range of motion;Decreased coordination;Decreased activity tolerance;Decreased safety awareness;Impaired balance (sitting and/or standing);Decreased knowledge of use of DME or AE;Decreased knowledge of precautions      OT Treatment/Interventions: Self-care/ADL training;Therapeutic exercise;Therapeutic activities;DME and/or AE instruction;Patient/family education;Balance training;Energy conservation    OT Goals(Current goals can be found in the care  plan section) Acute Rehab OT Goals Patient Stated Goal: get back to walking, control pain OT Goal Formulation: With patient Time For Goal Achievement: 06/28/19 ADL Goals Pt Will Perform Lower Body Dressing: sit to/from stand;with min assist(With LRAD PRN for improved safety and fxl independence upon hospital DC.) Pt Will Transfer to Toilet: bedside commode;with min guard assist;ambulating(With LRAD PRN for improved safety and fxl independence upon hospital DC.) Pt Will Perform Toileting - Clothing  Manipulation and hygiene: sit to/from stand;with min assist;with adaptive equipment(With LRAD PRN for improved safety and fxl independence upon hospital DC.) Additional ADL Goal #1: Pt will independently verbalize a plan to implement at least 3 learned falls prevention strategies into her daily routines/home environment for improved safety and functional independence upon hospital DC.  OT Frequency: Min 2X/week   Barriers to D/C: Decreased caregiver support;Inaccessible home environment          Co-evaluation PT/OT/SLP Co-Evaluation/Treatment: Yes Reason for Co-Treatment: For patient/therapist safety;To address functional/ADL transfers PT goals addressed during session: Mobility/safety with mobility;Proper use of DME;Strengthening/ROM OT goals addressed during session: ADL's and self-care;Proper use of Adaptive equipment and DME      AM-PAC OT "6 Clicks" Daily Activity     Outcome Measure Help from another person eating meals?: A Little Help from another person taking care of personal grooming?: A Little Help from another person toileting, which includes using toliet, bedpan, or urinal?: A Lot Help from another person bathing (including washing, rinsing, drying)?: A Lot Help from another person to put on and taking off regular upper body clothing?: A Little Help from another person to put on and taking off regular lower body clothing?: A Lot 6 Click Score: 15   End of Session Equipment Utilized During Treatment: Gait belt;Rolling walker Nurse Communication: Mobility status;Other (comment)(Pt with L breast pain)  Activity Tolerance: Patient tolerated treatment well Patient left: in bed;with call bell/phone within reach;with bed alarm set  OT Visit Diagnosis: Other abnormalities of gait and mobility (R26.89);Muscle weakness (generalized) (M62.81);History of falling (Z91.81);Pain Pain - Right/Left: Right Pain - part of body: Hip                Time: [id number] OT Time Calculation  (min): 49 min Charges:  OT General Charges $OT Visit: 1 Visit OT Evaluation $OT Eval Moderate Complexity: 1 Mod OT Treatments $Self Care/Home Management : 8-22 mins  Shara Blazing, M.S., OTR/L Ascom: [phone] 06/14/19, 11:18 AM

## 2019-06-14 NOTE — TOC Initial Note (Addendum)
Transition of Care [facility]) - Initial/Assessment Note    Patient Details  Name: [name] MRN: [id number] Date of Birth: [birth year]  Transition of Care [facility]) CM/SW Contact:    Su Hilt, RN Phone Number: 06/14/2019, 2:45 PM  Clinical Narrative:                 Met with the patient and her daughter in the room to discuss DC plan and needs, She stated that she has made calls and wants to go to [facility], I called Seth Bake at [facility] and she can offer a bed if the patient discharges tomorrow, if not then they are not sure, The patient has had both vacinnes and has [facility] for insurance. The patient is agreeable to do a bed search to other facilities in case she is unable to DC tomorrow, Will review the bed offers once received        Patient Goals and CMS Choice        Expected Discharge Plan and Services                                                Prior Living Arrangements/Services                       Activities of Daily Living Home Assistive Devices/Equipment: Gilford Rile (specify type) ADL Screening (condition at time of admission) Patient's cognitive ability adequate to safely complete daily activities?: Yes Is the patient deaf or have difficulty hearing?: No Does the patient have difficulty seeing, even when wearing glasses/contacts?: No Does the patient have difficulty concentrating, remembering, or making decisions?: No Patient able to express need for assistance with ADLs?: Yes Does the patient have difficulty dressing or bathing?: No Independently performs ADLs?: Yes (appropriate for developmental age) Does the patient have difficulty walking or climbing stairs?: No Weakness of Legs: None Weakness of Arms/Hands: None  Permission Sought/Granted                  Emotional Assessment              Admission diagnosis:  Right hip pain [M25.551] Femur fracture, right ([place]) [S72.91XA] Fall, initial encounter [W19.XXXA] Closed  fracture of right hip, initial encounter [facility]) [S72.001A] Patient Active Problem List   Diagnosis Date Noted  . Fall   . Intertrochanteric fracture of right femur ([place]) 06/11/2019  . Femur fracture, right ([place]) 06/11/2019  . Hip pain 12/23/2018  . Right ankle pain 12/23/2018  . Mixed hyperlipidemia 04/29/2017  . Lower extremity edema 12/24/2016  . Fatigue 12/24/2016  . Current use of long term anticoagulation 09/17/2016  . PAF (paroxysmal atrial fibrillation) ([place]) 08/13/2016  . Seizure ([place])   . Syncope and collapse   . Syncope 11/21/2015  . ARF (acute renal failure) ([place]) 10/26/2015  . Laceration of right lower leg 09/28/2015  . Bursitis of hip 08/27/2015  . Senile purpura ([place]) 01/01/2015  . Edema 08/15/2014  . Arthritis 07/12/2014  . Basal cell carcinoma 07/12/2014  . CC (collagenous colitis) 07/12/2014  . Essential hypertension 07/12/2014  . Hypercholesteremia 07/12/2014  . Adiposity 07/12/2014  . Acne erythematosa 07/12/2014  . Diabetes mellitus, type 2 ([place]) 07/12/2014  . 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 Avitaminosis D 07/12/2014  . H/O malignant neoplasm of skin 10/10/2011   PCP:  Margo Common, PA Pharmacy:  [facility] [address] [place] [address] Phone: [phone] Fax: [phone]     Social Determinants of Health (SDOH) Interventions    Readmission Risk Interventions No flowsheet data found.

## 2019-06-14 NOTE — Consult Note (Signed)
Cardiology Consultation:   Patient ID: [name] MRN: [id number]; DOB: Mar 07, 1932  Admit date: 06/11/2019 Date of Consult: 06/14/2019  Primary Care Provider: Margo Common, PA Primary Cardiologist: Nelva Bush, MD  Primary Electrophysiologist:  None    Patient Profile:   [name] is a 86 y.o. female with a hx of paroxysmal atrial fibrillation, hypertension, hyperlipidemia, type 2 diabetes mellitus, and recurrent syncope (thought to be vasovagal), who is being seen today for the evaluation of chest pain at the request of Dr. Reesa Chew.  History of Present Illness:   [name] suffered a mechanical fall 3 days ago at home.  She believes she tripped over a slipper and landed on [name] right hip.  She had excruciating right hip pain and was subsequently found to have an intertrochanteric fracture of the right femur.  She underwent operative repair yesterday after allowing 2 days for washout of apixaban.  She was restarted on apixaban today.  This morning, shortly before lunchtime, she began having a sharp left lateral chest pain, feeling as though she were lying on a rock.  She attempted to move and massage the area without significant relief.  She noted mild associated nausea but no other symptoms including shortness of breath, palpitations, and lightheadedness.  Pain did not improve with acetaminophen, nitroglycerin, or ketorolac.  Lidocaine patch was applied this afternoon with gradual resolution of the pain over about 30 minutes.  Currently, she is pain-free.  She denies any trauma to that area of [name] chest.  She has never felt that chest pain before.  [name] and [name] report at least 3-4 falls over the last 6 months.  [name] typically ambulates with a walker.  She has not passed out since [name] last office visit with me in February.   Past Medical History:  Diagnosis Date  . Chronic diarrhea   . Collagenous colitis   . Diabetes mellitus without complication  ([place])   . Diverticulitis   . Heart murmur    at birth, none since  . History of echocardiogram    a. 11/2015: echo showing EF of 60-65% with no WMA. Grade 1 DD and mild MR noted.   . Hypertension   . PAF (paroxysmal atrial fibrillation) ([place])    a. initially diagnosed in 08/2016 --> started on Eliquis  . Syncope    a. initially occurring in Fall 2016 b. Hospitalized in 10/2015 and 11/2015 for recurrent episodes.     Past Surgical History:  Procedure Laterality Date  . ABDOMINAL HYSTERECTOMY    . BACK SURGERY  08/08/2008   Lumbar discectomy  . COLONOSCOPY WITH PROPOFOL    . EXCISION/RELEASE BURSA HIP Left 05/03/2019   Procedure: HIP ABDUCTOR REPAIR;  Surgeon: Hessie Knows, MD;  Location: [facility] ORS;  Service: Orthopedics;  Laterality: Left;  . INTRAMEDULLARY (IM) NAIL INTERTROCHANTERIC Right 06/13/2019   Procedure: INTRAMEDULLARY (IM) NAIL INTERTROCHANTRIC;  Surgeon: Hessie Knows, MD;  Location: [facility] ORS;  Service: Orthopedics;  Laterality: Right;  . NECK SURGERY  1980     Home Medications:  Prior to Admission medications   Medication Sig Start Date Bettyjean Stefanski Date Taking? Authorizing Provider  acetaminophen (TYLENOL) 500 MG tablet Take 1,000 mg by mouth 2 (two) times daily as needed (pain.).   Yes [provider]  apixaban (ELIQUIS) 5 MG TABS tablet Take 1 tablet (5 mg total) by mouth 2 (two) times daily. 04/13/19  Yes Tylen Leverich, Harrell Gave, MD  Cholecalciferol (VITAMIN D) 2000 UNITS CAPS Take 2,000 Units by  mouth daily.    Yes [provider]  diphenhydramine-acetaminophen (TYLENOL PM) 25-500 MG TABS tablet Take 1 tablet by mouth at bedtime as needed (sleep.).    Yes [provider]  furosemide (LASIX) 20 MG tablet TAKE ONE TABLET BY MOUTH DAILY AS NEEDED Patient taking differently: Take 20 mg by mouth daily as needed (fluid retention.).  05/25/17  Yes Chrismon, Vickki Muff, PA  glipiZIDE (GLUCOTROL XL) 5 MG 24 hr tablet TAKE ONE TABLET BY MOUTH DAILY WITH BREAKFAST Patient  taking differently: Take 5 mg by mouth daily with breakfast. TAKE ONE TABLET BY MOUTH DAILY WITH BREAKFAST. 10/07/18  Yes Chrismon, Vickki Muff, PA  lisinopril (ZESTRIL) 5 MG tablet TAKE ONE TABLET BY MOUTH DAILY Patient taking differently: Take 5 mg by mouth daily.  04/05/19  Yes Chrismon, Vickki Muff, PA  metFORMIN (GLUCOPHAGE) 500 MG tablet TAKE ONE TABLET BY MOUTH TWICE A DAY WITH MEALS Patient taking differently: Take 500 mg by mouth 2 (two) times daily with a meal.  10/07/18  Yes Chrismon, Vickki Muff, PA  metoprolol succinate (TOPROL-XL) 25 MG 24 hr tablet Take 1 tablet (25 mg total) by mouth daily. 12/22/18  Yes Maximilien Hayashi, Harrell Gave, MD  Multiple Vitamin (MULTIVITAMIN WITH MINERALS) TABS tablet Take 1 tablet by mouth daily.   Yes [provider]  simvastatin (ZOCOR) 20 MG tablet TAKE ONE TABLET BY MOUTH EVERY NIGHT AT BEDTIME Patient taking differently: Take 20 mg by mouth at bedtime.  04/05/19  Yes Chrismon, Vickki Muff, PA    Inpatient Medications: Scheduled Meds: . apixaban  5 mg Oral BID  . cholecalciferol  2,000 Units Oral Daily  . docusate sodium  100 mg Oral BID  . feeding supplement (NEPRO CARB STEADY)  237 mL Oral Q24H  . insulin aspart  0-15 Units Subcutaneous TID WC  . insulin aspart  0-5 Units Subcutaneous QHS  . lidocaine  1 patch Transdermal Q24H  . lisinopril  5 mg Oral Daily  . metoprolol succinate  25 mg Oral Daily  . multivitamin with minerals  1 tablet Oral Daily  . simvastatin  20 mg Oral QHS   Continuous Infusions: . sodium chloride Stopped (06/14/19 0843)  . sodium chloride Stopped (06/14/19 0843)   PRN Meds: acetaminophen, alum & mag hydroxide-simeth, bisacodyl, furosemide, HYDROcodone-acetaminophen, magnesium citrate, magnesium hydroxide, menthol-cetylpyridinium **OR** phenol, methocarbamol, metoCLOPramide **OR** metoCLOPramide (REGLAN) injection, morphine injection, nitroGLYCERIN, ondansetron **OR** ondansetron (ZOFRAN) IV  Allergies:    Allergies  Allergen  Reactions  . Morphine Sulfate Other (See Comments)    BP bottoms out  . Penicillins Diarrhea    Has patient had a PCN reaction causing immediate rash, facial/tongue/throat swelling, SOB or lightheadedness with hypotension: Yes Has patient had a PCN reaction causing severe rash involving mucus membranes or skin necrosis: Yes Has patient had a PCN reaction that required hospitalization No Has patient had a PCN reaction occurring within the last 10 years: No If all of the above answers are "NO", then may proceed with Cephalosporin use.    Social History:   Social History   Socioeconomic History  . Marital status: Widowed    Spouse name: Not on file  . Number of children: 2  . Years of education: Not on file  . Highest education level: Associate degree: academic program  Occupational History  . Occupation: retired  Tobacco Use  . Smoking status: Former Smoker    Quit date: 03/09/1978    Years since quitting: 41.2  . Smokeless tobacco: Never Used  Substance and  Sexual Activity  . Alcohol use: Yes    Alcohol/week: 0.0 - 1.0 standard drinks  . Drug use: No  . Sexual activity: Not on file  Other Topics Concern  . Not on file  Social History Narrative  . Not on file   Social Determinants of Health   Financial Resource Strain:   . Difficulty of Paying Living Expenses:   Food Insecurity:   . Worried About Charity fundraiser in the Last Year:   . Arboriculturist in the Last Year:   Transportation Needs:   . Film/video editor (Medical):   Marland Kitchen Lack of Transportation (Non-Medical):   Physical Activity:   . Days of Exercise per Week:   . Minutes of Exercise per Session:   Stress:   . Feeling of Stress :   Social Connections:   . Frequency of Communication with Friends and Family:   . Frequency of Social Gatherings with Friends and Family:   . Attends Religious Services:   . Active Member of Clubs or Organizations:   . Attends Archivist Meetings:   Marland Kitchen Marital  Status:   Intimate Partner Violence:   . Fear of Current or Ex-Partner:   . Emotionally Abused:   Marland Kitchen Physically Abused:   . Sexually Abused:     Family History:   Family History  Problem Relation Age of Onset  . Hypertension Mother   . Diabetes Mother   . Lung cancer Father      ROS:  Please see the history of present illness. All other ROS reviewed and negative.     Physical Exam/Data:   Vitals:   06/14/19 0423 06/14/19 0808 06/14/19 1136 06/14/19 1555  BP: (!) 136/56 121/62 139/78 (!) 127/54  Pulse: 71 69 84 68  Resp: 16 16 18 16   Temp: 97.7 F (36.5 C) 98 F (36.7 C) 98.3 F (36.8 C)   TempSrc: Oral Oral Oral   SpO2: 98% 98% 95% 97%  Weight:      Height:        Intake/Output Summary (Last 24 hours) at 06/14/2019 1724 Last data filed at 06/14/2019 0959 Gross per 24 hour  Intake 360 ml  Output 550 ml  Net -190 ml   Last 3 Weights 06/11/2019 05/03/2019 04/13/2019  Weight (lbs) 158 lb 162 lb 164 lb  Weight (kg) 71.668 kg 73.483 kg 74.39 kg     Body mass index is 27.99 kg/m.  General:  Well nourished, well developed, in no acute distress.  Accompanied by [name]. HEENT: normal Lymph: no adenopathy Neck: no JVD Endocrine:  No thryomegaly Vascular: No carotid bruits; 2+ radial and pedal pulses bilaterally. Cardiac: Regular rate and rhythm without murmurs, rubs, or gallops. Lungs: Clear anteriorly.  Patient unable to sit up for posterior auscultation due to hip pain. Abd: soft, nontender, no hepatomegaly  Ext: no edema Musculoskeletal:  No deformities, BUE and BLE strength normal and equal Skin: warm and dry  Neuro:  CNs 2-12 intact, no focal abnormalities noted Psych:  Normal affect   EKG:  The EKG was personally reviewed and demonstrates:  Normal sinus rhythm with PAC's.  Otherwise, no significant abnormality. Telemetry:  Telemetry was personally reviewed and demonstrates: Normal sinus rhythm without significant abnormality.  Relevant CV Studies: TTE  (04/21/19): 1. Left ventricular ejection fraction, by estimation, is 55 to 60%. The  left ventricle has normal function. The left ventricle has no regional  wall motion abnormalities. Left ventricular diastolic parameters are  consistent with Grade I diastolic  dysfunction (impaired relaxation).  2. Right ventricular systolic function is normal. The right ventricular  size is normal.   Pharmacologic MPI (01/08/16): Low risk study with small apical and apical anterior defect most consistent with artifact but cannot rule out scar.  LVEF normal.  No significant ischemia.  Laboratory Data:  High Sensitivity Troponin:   Recent Labs  Lab 06/14/19 1041 06/14/19 1228 06/14/19 1536  TROPONINIHS 85* 89* 98*     Chemistry Recent Labs  Lab 06/11/19 1805 06/12/19 0446 06/14/19 0458  NA 141 140 138  K 4.4 4.8 4.9  CL 108 106 104  CO2 25 26 24   GLUCOSE 152* 134* 278*  BUN 25* 25* 26*  CREATININE 0.63 0.90 0.76  CALCIUM 9.0 9.2 8.5*  GFRNONAA >60 58* >60  GFRAA >60 >60 >60  ANIONGAP 8 8 10     No results for input(s): PROT, ALBUMIN, AST, ALT, ALKPHOS, BILITOT in the last 168 hours. Hematology Recent Labs  Lab 06/11/19 1513 06/12/19 0446 06/14/19 0458  WBC 9.1 10.5 14.8*  RBC 4.06 3.96 3.85*  HGB 12.4 12.0 11.5*  HCT 36.7 36.4 35.4*  MCV 90.4 91.9 91.9  MCH 30.5 30.3 29.9  MCHC 33.8 33.0 32.5  RDW 12.1 12.1 12.4  PLT 240 246 207   BNPNo results for input(s): BNP, PROBNP in the last 168 hours.  DDimer No results for input(s): DDIMER in the last 168 hours.   Radiology/Studies:  DG Chest 1 View  Result Date: 06/11/2019 CLINICAL DATA:  Fall today with right hip fracture. EXAM: CHEST  1 VIEW COMPARISON:  04/27/2018 FINDINGS: Low lung volumes. Unchanged elevation of left hemidiaphragm with adjacent atelectasis or scarring. Normal heart size with unchanged mediastinal contours. Mild aortic atherosclerosis. No pulmonary edema, confluent airspace disease, large pleural effusion  or pneumothorax. No acute osseous abnormalities are seen. IMPRESSION: Low lung volumes without acute abnormality. Chronic elevation of left hemidiaphragm. Aortic Atherosclerosis (ICD10-I70.0). Electronically Signed   By: Keith Rake M.D.   On: 06/11/2019 15:03   DG HIP OPERATIVE UNILAT W OR W/O PELVIS RIGHT  Result Date: 06/13/2019 CLINICAL DATA:  Proximal right femoral fracture EXAM: OPERATIVE RIGHT HIP WITH PELVIS COMPARISON:  06/11/2019 FLUOROSCOPY TIME:  Radiation Exposure Index (as provided by the fluoroscopic device): Not available If the device does not provide the exposure index: Fluoroscopy Time:  36 seconds Number of Acquired Images:  4 FINDINGS: Medullary rod is noted with proximal fixation screw and distal fixation screw. The fracture fragments are in near anatomic alignment. IMPRESSION: ORIF of proximal right femoral fracture Electronically Signed   By: Inez Catalina M.D.   On: 06/13/2019 14:12   DG Hip Unilat With Pelvis 2-3 Views Right  Result Date: 06/11/2019 CLINICAL DATA:  Fall today with right hip injury. Right hip pain. Tripped over slippers. EXAM: DG HIP (WITH OR WITHOUT PELVIS) 2-3V RIGHT COMPARISON:  None. FINDINGS: Acute intertrochanteric right proximal femur fracture. Fracture is minimally displaced and involves both greater and lesser trochanters. Displacement is less than 1 cm. Femoral head remains seated in the acetabulum. Mild right hip osteoarthritis. Pubic rami are intact. No additional fracture of the pelvis. IMPRESSION: Acute minimally displaced intertrochanteric right femur fracture. Electronically Signed   By: Keith Rake M.D.   On: 06/11/2019 15:02    Assessment and Plan:   Atypical chest pain: Pain is not consistent with angina and has resolved with lidocaine patch, suggesting a cutaneous or musculoskeletal etiology.  I do not see a rash in  that area to suggest shingles, though the most severe focus of pain is currently covered with a lidocaine patch.   Troponin has trended up slightly but is overall nonspecific.  I have a low suspicion for PE, given that the patient has been anticoagulated with apixaban other than for 2 days leading up to femur repair.  Continue lidocaine patch for symptomatic relief.  Trend high-sensitivity troponin I until it has peaked, then stop.  Obtain a limited echo to assess LV function.  If there is a regional wall motion abnormality, coronary angiography will need to be considered during this admission.  Otherwise, I favor outpatient myocardial perfusion stress test once the patient has recovered from [name] hip fracture.  Start aspirin 81 mg daily.  Continue metoprolol succinate 25 mg daily.  Paroxysmal atrial fibrillation: No symptoms to suggest recurrence.  Telemetry here thus far has shown sinus rhythm.  Continue apixaban 5 mg twice daily for now.  I spoke with [name] and [name] at length regarding risks and benefits of continued anticoagulation in setting of frequent falls.  I favor discontinuation of anticoagulation but will defer this decision to [name], who remains undecided.  Continue metoprolol succinate 25 mg daily.  For questions or updates, please contact [facility] Please consult [email or web address] for contact info under [facility].  Signed, Nelva Bush, MD  06/14/2019 5:24 PM

## 2019-06-15 DIAGNOSIS — R079 Chest pain, unspecified: Secondary | ICD-10-CM

## 2019-06-15 DIAGNOSIS — S72001A Fracture of unspecified part of neck of right femur, initial encounter for closed fracture: Secondary | ICD-10-CM

## 2019-06-15 LAB — RESPIRATORY PANEL BY RT PCR (FLU A&B, COVID)
Influenza A by PCR: NEGATIVE
Influenza B by PCR: NEGATIVE
SARS Coronavirus 2 by RT PCR: NEGATIVE

## 2019-06-15 LAB — GLUCOSE, CAPILLARY
Glucose-Capillary: 179 mg/dL — ABNORMAL HIGH (ref 70–99)
Glucose-Capillary: 182 mg/dL — ABNORMAL HIGH (ref 70–99)
Glucose-Capillary: 174 mg/dL — ABNORMAL HIGH (ref 70–99)
Glucose-Capillary: 180 mg/dL — ABNORMAL HIGH (ref 70–99)

## 2019-06-15 LAB — BASIC METABOLIC PANEL
Anion gap: 6 (ref 5–15)
BUN: 36 mg/dL — ABNORMAL HIGH (ref 8–23)
CO2: 25 mmol/L (ref 22–32)
Calcium: 8.1 mg/dL — ABNORMAL LOW (ref 8.9–10.3)
Chloride: 104 mmol/L (ref 98–111)
Creatinine, Ser: 0.79 mg/dL (ref 0.44–1.00)
GFR calc Af Amer: >60 mL/min (ref >60)
GFR calc non Af Amer: >60 mL/min (ref >60)
Glucose, Bld: 221 mg/dL — ABNORMAL HIGH (ref 70–99)
Potassium: 4.1 mmol/L (ref 3.5–5.1)
Sodium: 135 mmol/L (ref 135–145)

## 2019-06-15 LAB — CBC
HCT: 32.3 % — ABNORMAL LOW (ref 36.0–46.0)
Hemoglobin: 10.6 g/dL — ABNORMAL LOW (ref 12.0–15.0)
MCH: 30.2 pg (ref 26.0–34.0)
MCHC: 32.8 g/dL (ref 30.0–36.0)
MCV: 92 fL (ref 80.0–100.0)
Platelets: 222 10*3/uL (ref 150–400)
RBC: 3.51 MIL/uL — ABNORMAL LOW (ref 3.87–5.11)
RDW: 12.7 % (ref 11.5–15.5)
WBC: 10.9 10*3/uL — ABNORMAL HIGH (ref 4.0–10.5)
nRBC: 0 % (ref 0.0–0.2)

## 2019-06-15 LAB — ECHOCARDIOGRAM LIMITED
Height: 63 in
Weight: 2528 oz

## 2019-06-15 LAB — TROPONIN I (HIGH SENSITIVITY): Troponin I (High Sensitivity): 48 ng/L — ABNORMAL HIGH (ref <18)

## 2019-06-15 MED ORDER — LISINOPRIL 10 MG PO TABS: 10.0000 mg | ORAL_TABLET | Freq: Every day | ORAL | Status: DC

## 2019-06-15 MED ORDER — BISACODYL 10 MG RE SUPP: 10.0000 mg | Freq: Every day | RECTAL | 0 refills | Status: DC | PRN

## 2019-06-15 MED ORDER — FLEET ENEMA 7-19 GM/118ML RE ENEM
1.0000 | ENEMA | Freq: Once | RECTAL | Status: AC
  Administered 2019-06-16: 06:00:00 1 via RECTAL

## 2019-06-15 MED ORDER — DOCUSATE SODIUM 100 MG PO CAPS: 100.0000 mg | ORAL_CAPSULE | Freq: Two times a day (BID) | ORAL | 0 refills | Status: DC

## 2019-06-15 MED ORDER — METHOCARBAMOL 500 MG PO TABS: 500.0000 mg | ORAL_TABLET | Freq: Four times a day (QID) | ORAL | Status: DC | PRN

## 2019-06-15 MED ORDER — NEPRO/CARBSTEADY PO LIQD: 237.0000 mL | ORAL | 0 refills | Status: DC

## 2019-06-15 MED ORDER — HYDROCODONE-ACETAMINOPHEN 5-325 MG PO TABS: 1.0000 | ORAL_TABLET | Freq: Four times a day (QID) | ORAL | 0 refills | Status: DC | PRN

## 2019-06-15 NOTE — Progress Notes (Signed)
Physical Therapy Treatment Patient Details Name: [name] MRN: [id number] DOB: [birth year] Today's Date: 06/15/2019    History of Present Illness [name] is a 86 y.o. female with medical history significant for hypertension, diabetes mellitus, atrial fibrillation, recent left hip abductor tendon repair who was brought into the emergency room by EMS after a non-syncopal fall at home.  Patient states that she tripped on her slippers and fell on her right hip.  She is now s/p R IM nail intertrochantric.    PT Comments    Pt was supine in bed upon arriving for 2nd session today. Pt's requested not to get OOB this session but was agreeable to performing ther ex in bed. See exercises listed below. Overall pt is progressing well with PT however will benefit from SNF to address deficits with strength, balance, and safe functional mobility. At conclusion of session, pt is supine in bed with HOB slightly elevated, bed alarm in place, and call bell in reach. Acute PT will continue to follow per POC.     Follow Up Recommendations  SNF     Equipment Recommendations  None recommended by PT    Recommendations for Other Services       Precautions / Restrictions Precautions Precautions: Fall Precaution Comments: High Fall Restrictions Weight Bearing Restrictions: Yes RLE Weight Bearing: Weight bearing as tolerated    Mobility  Bed Mobility               General bed mobility comments: deferred for pt comfort.  Transfers                 General transfer comment: deferred for pt comfort.  Ambulation/Gait                 Stairs             Wheelchair Mobility    Modified Rankin (Stroke Patients Only)       Balance                                            Cognition Arousal/Alertness: Awake/alert Behavior During Therapy: WFL for tasks assessed/performed Overall Cognitive Status: Within Functional Limits for tasks  assessed                                 General Comments: Pt was A and O x 4 this afternoon and willing to participate in ther ex only. reports feeling very tired.      Exercises General Exercises - Lower Extremity Ankle Circles/Pumps: AROM;Both;20 reps Quad Sets: AROM;Right;10 reps Gluteal Sets: AROM;Right;10 reps Short Arc Quad: AROM;Right;10 reps Heel Slides: AROM;Right;10 reps Hip ABduction/ADduction: AAROM;Right;10 reps Straight Leg Raises: AAROM;Right;10 reps Other Exercises Other Exercises: Pt educated on safe use of AE/DME for ADL management, compression stocking management, and falls prevention strategies. See ADL section for additional detail.    General Comments        Pertinent Vitals/Pain Pain Assessment: 0-10 Pain Score: 5  Pain Location: R hip Pain Descriptors / Indicators: Sore;Discomfort;Grimacing Pain Intervention(s): Limited activity within patient's tolerance;Monitored during session;Patient requesting pain meds-RN notified    Home Living                      Prior Function  PT Goals (current goals can now be found in the care plan section) Acute Rehab PT Goals Patient Stated Goal: I want to move better so I can go home Progress towards PT goals: Progressing toward goals    Frequency    BID      PT Plan Current plan remains appropriate    Co-evaluation     PT goals addressed during session: Strengthening/ROM        AM-PAC PT "6 Clicks" Mobility   Outcome Measure  Help needed turning from your back to your side while in a flat bed without using bedrails?: A Lot Help needed moving from lying on your back to sitting on the side of a flat bed without using bedrails?: A Lot Help needed moving to and from a bed to a chair (including a wheelchair)?: A Lot Help needed standing up from a chair using your arms (e.g., wheelchair or bedside chair)?: A Lot Help needed to walk in hospital room?: Total Help  needed climbing 3-5 steps with a railing? : Total 6 Click Score: 10    End of Session   Activity Tolerance: Patient tolerated treatment well Patient left: in bed;with call bell/phone within reach Nurse Communication: Mobility status PT Visit Diagnosis: Muscle weakness (generalized) (M62.81);Difficulty in walking, not elsewhere classified (R26.2);Pain Pain - Right/Left: Right Pain - part of body: Hip     Time: [id number] PT Time Calculation (min) (ACUTE ONLY): 16 min  Charges:  $Therapeutic Exercise: 8-22 mins                     Julaine Fusi PTA 06/15/19, 4:34 PM

## 2019-06-15 NOTE — Progress Notes (Signed)
Occupational Therapy Treatment Patient Details Name: [name] MRN: [id number] DOB: November 29, 1931 Today's Date: 06/15/2019    History of present illness [name] is a 86 y.o. female with medical history significant for hypertension, diabetes mellitus, atrial fibrillation, recent left hip abductor tendon repair who was brought into the emergency room by EMS after a non-syncopal fall at home.  Patient states that she tripped on her slippers and fell on her right hip.  She is now s/p R IM nail intertrochantric.   OT comments  [name] was seen for OT treatment on this date. Upon arrival to room pt awake/alert, semi-supine in bed, endorsing 5/10 pain in her R hip, but agreeable to OT tx. Pt instructed in safe use of AE/DME for ADL management including use of LH Reacher and sock aid for LB dressing and use of BSC upon return home to maximize safety and falls prevention. Pt also educated on compression stocking management strategies. Pt return verbalizes understanding of education provided, but declines to return demonstrate 2/2 increased pain and fatigue. RN notified pt requesting pain medication when available. Pt is progressing toward OT goals and continues to benefit from skilled OT services to maximize return to PLOF and minimize risk of future falls, injury, caregiver burden, and readmission. Will continue to follow POC as written. Discharge recommendation remains appropriate.    Follow Up Recommendations  SNF    Equipment Recommendations  3 in 1 bedside commode    Recommendations for Other Services      Precautions / Restrictions Precautions Precautions: Fall Precaution Comments: High Fall Restrictions Weight Bearing Restrictions: Yes RLE Weight Bearing: Weight bearing as tolerated       Mobility Bed Mobility               General bed mobility comments: deferred for pt comfort.  Transfers                 General transfer comment: deferred for pt  comfort.    Balance                                           ADL either performed or assessed with clinical judgement   ADL Overall ADL's : Needs assistance/impaired                                       General ADL Comments: Pt continues to be functionally limited by increased pain and decreased AROM of her R hip. Pt and provider discuss safe use of AE for LB ADL management, however pt states she is not feeling up to trial this date. Pt educated on use of LH reacher and sock aid for LB dressing as well as compression stocking management strategies this date.     Vision Baseline Vision/History: Wears glasses;Cataracts Wears Glasses: At all times Patient Visual Report: No change from baseline     Perception     Praxis      Cognition Arousal/Alertness: Awake/alert Behavior During Therapy: WFL for tasks assessed/performed Overall Cognitive Status: Within Functional Limits for tasks assessed  Exercises Other Exercises Other Exercises: Pt educated on safe use of AE/DME for ADL management, compression stocking management, and falls prevention strategies. See ADL section for additional detail.   Shoulder Instructions       General Comments      Pertinent Vitals/ Pain       Pain Assessment: 0-10 Pain Score: 5  Pain Location: R hip Pain Descriptors / Indicators: Sore;Discomfort;Grimacing Pain Intervention(s): Limited activity within patient's tolerance;Monitored during session;Patient requesting pain meds-RN notified  Home Living                                          Prior Functioning/Environment              Frequency  Min 2X/week        Progress Toward Goals  OT Goals(current goals can now be found in the care plan section)  Progress towards OT goals: Progressing toward goals  Acute Rehab OT Goals Patient Stated Goal: I want to move better so  I can go home OT Goal Formulation: With patient Time For Goal Achievement: 06/28/19  Plan Discharge plan remains appropriate;Frequency remains appropriate    Co-evaluation                 AM-PAC OT "6 Clicks" Daily Activity     Outcome Measure   Help from another person eating meals?: A Little Help from another person taking care of personal grooming?: A Little Help from another person toileting, which includes using toliet, bedpan, or urinal?: A Lot Help from another person bathing (including washing, rinsing, drying)?: A Lot Help from another person to put on and taking off regular upper body clothing?: A Little Help from another person to put on and taking off regular lower body clothing?: A Lot 6 Click Score: 15    End of Session    OT Visit Diagnosis: Other abnormalities of gait and mobility (R26.89);Muscle weakness (generalized) (M62.81);History of falling (Z91.81);Pain Pain - Right/Left: Right Pain - part of body: Hip   Activity Tolerance Patient tolerated treatment well   Patient Left in bed;with call bell/phone within reach;with bed alarm set   Nurse Communication Patient requests pain meds        Time: [id number] OT Time Calculation (min): 24 min  Charges: OT General Charges $OT Visit: 1 Visit OT Treatments $Self Care/Home Management : 23-37 mins  Shara Blazing, M.S., OTR/L Ascom: [phone] 06/15/19, 3:05 PM

## 2019-06-15 NOTE — Progress Notes (Signed)
Physical Therapy Treatment Patient Details Name: DENNELL SATTERWHITE MRN: [id number] DOB: 07-Jun-1931 Today's Date: 06/15/2019    History of Present Illness [name] is a 86 y.o. female with medical history significant for hypertension, diabetes mellitus, atrial fibrillation, recent left hip abductor tendon repair who was brought into the emergency room by EMS after a non-syncopal fall at home.  Patient states that she tripped on her slippers and fell on her right hip.  She is now s/p R IM nail intertrochantric.    PT Comments    Pt was supine in bed upon arriving. She agrees to PT session and is cooperative and pleasant throughout. During session three MD entered room. Pt reports no chest pain today and states," I feel so much better today." She agrees to OOB activity. She exited L side of bed with +1 mod assist however required max assist to return to supine from EOB sitting. Pt stood 3 x to RW prior to stand pivot on/off BSC. Pt unable to have BM. She was able to tolerate there ex and overall is progressing well with PT. Per MD, DC today to SNF. Pt will benefit from continued skilled PT to address deficits with strength, balance, and safe functional mobility. Pt was in bed with bed alarm place, call bell in reach, and daughter at bedside.     Follow Up Recommendations  SNF     Equipment Recommendations  None recommended by PT    Recommendations for Other Services       Precautions / Restrictions Precautions Precautions: Fall Precaution Comments: High Fall Restrictions Weight Bearing Restrictions: No RLE Weight Bearing: Weight bearing as tolerated    Mobility  Bed Mobility Overal bed mobility: Needs Assistance Bed Mobility: Supine to Sit     Supine to sit: Mod assist;HOB elevated Sit to supine: Max assist   General bed mobility comments: Pt was able to exit L side of bed with Mod assist + max vcs for technique. She required max assist to return to supine    Transfers Overall transfer level: Needs assistance Equipment used: Rolling walker (2 wheeled) Transfers: Sit to/from [facility] Sit to Stand: From elevated surface;Max assist Stand pivot transfers: Max assist;From elevated surface       General transfer comment: Pt was able to stand 3 x EOB with max assist + vcs for technique and safety. First trial pt only able to stand ~ 20 sec but on 2-3rd pt able to stand holding RW with improved posture x 2 minutes. She requested to use BSC. Max assist to stand pivot with BUE support by therapist without use of RW.  Ambulation/Gait             General Gait Details: unable to safely progress to ambulation   Stairs             Wheelchair Mobility    Modified Rankin (Stroke Patients Only)       Balance Overall balance assessment: Needs assistance Sitting-balance support: Feet supported Sitting balance-Leahy Scale: Fair Sitting balance - Comments: Steady static sitting, reaching within BOS   Standing balance support: Bilateral upper extremity supported Standing balance-Leahy Scale: Fair Standing balance comment: needed heavy assist just to maintain upright at EOB                            Cognition Arousal/Alertness: Awake/alert Behavior During Therapy: WFL for tasks assessed/performed Overall Cognitive Status: Within Functional Limits for tasks assessed  General Comments: Pt waqs alert and oriented x 3. didn't know correct day of week. She was cooperative and able to follow commands consistently throughout      Exercises General Exercises - Lower Extremity Ankle Circles/Pumps: AROM;10 reps Quad Sets: Strengthening;10 reps Gluteal Sets: AROM;10 reps Heel Slides: 10 reps;AAROM;AROM;Strengthening Hip ABduction/ADduction: AROM;AAROM;10 reps Straight Leg Raises: AAROM;Right;10 reps    General Comments        Pertinent Vitals/Pain Pain  Assessment: 0-10 Pain Score: 4  Pain Location: R hip Pain Descriptors / Indicators: Sore;Discomfort;Grimacing Pain Intervention(s): Limited activity within patient's tolerance;Monitored during session;Premedicated before session;Repositioned    Home Living                      Prior Function            PT Goals (current goals can now be found in the care plan section) Acute Rehab PT Goals Patient Stated Goal: I want to move better so I can go home Progress towards PT goals: Progressing toward goals    Frequency    BID      PT Plan Current plan remains appropriate    Co-evaluation     PT goals addressed during session: Mobility/safety with mobility;Strengthening/ROM        AM-PAC PT "6 Clicks" Mobility   Outcome Measure  Help needed turning from your back to your side while in a flat bed without using bedrails?: A Lot Help needed moving from lying on your back to sitting on the side of a flat bed without using bedrails?: A Lot Help needed moving to and from a bed to a chair (including a wheelchair)?: A Lot Help needed standing up from a chair using your arms (e.g., wheelchair or bedside chair)?: A Lot Help needed to walk in hospital room?: Total Help needed climbing 3-5 steps with a railing? : Total 6 Click Score: 10    End of Session Equipment Utilized During Treatment: Gait belt Activity Tolerance: Patient tolerated treatment well Patient left: in bed;with call bell/phone within reach Nurse Communication: Mobility status PT Visit Diagnosis: Muscle weakness (generalized) (M62.81);Difficulty in walking, not elsewhere classified (R26.2);Pain Pain - Right/Left: Right Pain - part of body: Hip     Time: 1020-1110 PT Time Calculation (min) (ACUTE ONLY): 50 min  Charges:  $Therapeutic Exercise: 8-22 mins $Therapeutic Activity: 23-37 mins                     Julaine Fusi PTA 06/15/19, 11:35 AM

## 2019-06-15 NOTE — TOC Progression Note (Signed)
Transition of Care [facility]) - Progression Note    Patient Details  Name: [name] MRN: [id number] Date of Birth: [birth year]  Transition of Care [facility]) CM/SW [place], RN Phone Number: 06/15/2019, 3:07 PM  Clinical Narrative:     [facility] to inquire about the insurance auth ref number [id number], it is still pending review and has not been assigned yet, Spoke with Caryl Pina the reviewer and she stated that it is still pending and there is nothing she can do to move forward faster.  It may not be today       Expected Discharge Plan and Services           Expected Discharge Date: 06/15/19                                     Social Determinants of Health (SDOH) Interventions    Readmission Risk Interventions No flowsheet data found.

## 2019-06-15 NOTE — TOC Progression Note (Signed)
Transition of Care [facility]) - Progression Note    Patient Details  Name: [name] MRN: [id number] Date of Birth: Sep 23, 1931  Transition of Care [facility]) CM/SW [place], RN Phone Number: 06/15/2019, 9:09 AM  Clinical Narrative:     Met with the patient in the room to provide Medicare star rating and a list of bed offers if she is unable to go to [facility].  She said she will wait for her daughter to come to discuss.         Expected Discharge Plan and Services                                                 Social Determinants of Health (SDOH) Interventions    Readmission Risk Interventions No flowsheet data found.

## 2019-06-15 NOTE — TOC Progression Note (Signed)
Transition of Care [facility]) - Progression Note    Patient Details  Name: [name] MRN: [id number] Date of Birth: Jul 09, 1931  Transition of Care [facility]) CM/SW [place], RN Phone Number: 06/15/2019, 11:57 AM  Clinical Narrative:     The patient will go to [facility] today, needs to have a BM, I called Voncille Lo to get insurance auth Faxed clinical information to [place] at [phone] ref number [id number]       Expected Discharge Plan and Services                                                 Social Determinants of Health (SDOH) Interventions    Readmission Risk Interventions No flowsheet data found.

## 2019-06-15 NOTE — Discharge Summary (Addendum)
Physician Discharge Summary  [name] [id number] DOB: 24-Mar-1931 DOA: 06/11/2019  PCP: Margo Common, PA  Admit date: 06/11/2019 Discharge date: 06/16/2019  Admitted From: Home Disposition:  SNF  Recommendations for Outpatient Follow-up:  1. Follow up with PCP in 1-2 weeks 2. Follow-up with orthopedic in 2 weeks. 3. Follow-up with cardiology 4. Please obtain BMP/CBC in one week 5. Please follow up on the following pending results: None.  Home Health: No Equipment/Devices: None Discharge Condition: Fair CODE STATUS: Full Diet recommendation: Heart Healthy / Carb Modified   Brief/Interim Summary: 86 year old female with history of hypertension, paroxysmal A. fib on Eliquis, diabetes mellitus, recent left hip abductor repair 6 weeks back who presented after a mechanical fall landing on her right hip and sustained minimally displaced intertrochanteric right femur fracture. Seen by orthopedic surgery and she was taken to the OR for ORIF on 06/13/2019 after clearing of her Eliquis.  Eliquis resumed postoperatively.  She decided to go to a rehab facility for further management and rehab and will follow up with orthopedic surgery in 2 weeks.  Patient is on Eliquis for A. fib and does not require any more DVT prophylaxis.  Did develop some nonspecific left-sided chest pain a day prior to discharge.  No associated symptoms.  Chest pain resolved with lidocaine patch.  No rash.  High-sensitivity troponin peaked at 105 and started trending down.  No change on EKG. echocardiogram obtained which shows mild left ventricular wall motion abnormalities and hypokinesia.  Normal EF and grade 1 diastolic dysfunction.  Cardiology was consulted and they recommend outpatient stress testing.  Patient needs to follow-up with cardiologist as an outpatient.  Patient remained in sinus rhythm while in hospital and will continue home dose of Toprol and Eliquis for paroxysmal atrial fibrillation.  Patient will  continue her home meds for hypertension and diabetes.  We increased the dose of lisinopril from 5-10 due to elevated blood pressure.  She needs to follow-up with her primary care physician for further management.  Discharge Diagnoses:  Principal Problem:   Intertrochanteric fracture of right femur ([place]) Active Problems:   Essential hypertension   Diabetes mellitus, type 2 (HCC)   PAF (paroxysmal atrial fibrillation) (HCC)   Femur fracture, right ([place])   Fall   Chest pain of uncertain etiology   Closed fracture of right hip [facility])  Discharge Instructions  Discharge Instructions    Diet - low sodium heart healthy   Complete by: As directed    Increase activity slowly   Complete by: As directed      Allergies as of 06/15/2019      Reactions   Morphine Sulfate Other (See Comments)   BP bottoms out   Penicillins Diarrhea   Has patient had a PCN reaction causing immediate rash, facial/tongue/throat swelling, SOB or lightheadedness with hypotension: Yes Has patient had a PCN reaction causing severe rash involving mucus membranes or skin necrosis: Yes Has patient had a PCN reaction that required hospitalization No Has patient had a PCN reaction occurring within the last 10 years: No If all of the above answers are "NO", then may proceed with Cephalosporin use.      Medication List    TAKE these medications   acetaminophen 500 MG tablet Commonly known as: TYLENOL Take 1,000 mg by mouth 2 (two) times daily as needed (pain.).   apixaban 5 MG Tabs tablet Commonly known as: Eliquis Take 1 tablet (5 mg total) by mouth 2 (two) times daily.   bisacodyl 10  MG suppository Commonly known as: DULCOLAX Place 1 suppository (10 mg total) rectally daily as needed for moderate constipation.   diphenhydramine-acetaminophen 25-500 MG Tabs tablet Commonly known as: TYLENOL PM Take 1 tablet by mouth at bedtime as needed (sleep.).   docusate sodium 100 MG capsule Commonly known as: COLACE Take  1 capsule (100 mg total) by mouth 2 (two) times daily.   feeding supplement (NEPRO CARB STEADY) Liqd Take 237 mLs by mouth daily. Start taking on: June 16, 2019   furosemide 20 MG tablet Commonly known as: LASIX TAKE ONE TABLET BY MOUTH DAILY AS NEEDED What changed: reasons to take this   glipiZIDE 5 MG 24 hr tablet Commonly known as: GLUCOTROL XL TAKE ONE TABLET BY MOUTH DAILY WITH BREAKFAST What changed: additional instructions   HYDROcodone-acetaminophen 5-325 MG tablet Commonly known as: NORCO/VICODIN Take 1-2 tablets by mouth every 6 (six) hours as needed for moderate pain.   lisinopril 10 MG tablet Commonly known as: ZESTRIL Take 1 tablet (10 mg total) by mouth daily. What changed:   medication strength  how much to take   metFORMIN 500 MG tablet Commonly known as: GLUCOPHAGE TAKE ONE TABLET BY MOUTH TWICE A DAY WITH MEALS   methocarbamol 500 MG tablet Commonly known as: ROBAXIN Take 1 tablet (500 mg total) by mouth every 6 (six) hours as needed for muscle spasms.   metoprolol succinate 25 MG 24 hr tablet Commonly known as: TOPROL-XL Take 1 tablet (25 mg total) by mouth daily.   multivitamin with minerals Tabs tablet Take 1 tablet by mouth daily.   simvastatin 20 MG tablet Commonly known as: ZOCOR TAKE ONE TABLET BY MOUTH EVERY NIGHT AT BEDTIME   Vitamin D 50 MCG (2000 UT) Caps Take 2,000 Units by mouth daily.      Follow-up Information    Duanne Guess, PA-C Follow up in 2 week(s).   Specialties: Orthopedic Surgery, Emergency Medicine Contact information: [place] [address] [phone]          Allergies  Allergen Reactions  . Morphine Sulfate Other (See Comments)    BP bottoms out  . Penicillins Diarrhea    Has patient had a PCN reaction causing immediate rash, facial/tongue/throat swelling, SOB or lightheadedness with hypotension: Yes Has patient had a PCN reaction causing severe rash involving mucus membranes  or skin necrosis: Yes Has patient had a PCN reaction that required hospitalization No Has patient had a PCN reaction occurring within the last 10 years: No If all of the above answers are "NO", then may proceed with Cephalosporin use.    Consultations:  Orthopedic  Cardiology  Procedures/Studies: DG Chest 1 View  Result Date: 06/11/2019 CLINICAL DATA:  Fall today with right hip fracture. EXAM: CHEST  1 VIEW COMPARISON:  04/27/2018 FINDINGS: Low lung volumes. Unchanged elevation of left hemidiaphragm with adjacent atelectasis or scarring. Normal heart size with unchanged mediastinal contours. Mild aortic atherosclerosis. No pulmonary edema, confluent airspace disease, large pleural effusion or pneumothorax. No acute osseous abnormalities are seen. IMPRESSION: Low lung volumes without acute abnormality. Chronic elevation of left hemidiaphragm. Aortic Atherosclerosis (ICD10-I70.0). Electronically Signed   By: Keith Rake M.D.   On: 06/11/2019 15:03   DG HIP OPERATIVE UNILAT W OR W/O PELVIS RIGHT  Result Date: 06/13/2019 CLINICAL DATA:  Proximal right femoral fracture EXAM: OPERATIVE RIGHT HIP WITH PELVIS COMPARISON:  06/11/2019 FLUOROSCOPY TIME:  Radiation Exposure Index (as provided by the fluoroscopic device): Not available If the device does not provide  the exposure index: Fluoroscopy Time:  36 seconds Number of Acquired Images:  4 FINDINGS: Medullary rod is noted with proximal fixation screw and distal fixation screw. The fracture fragments are in near anatomic alignment. IMPRESSION: ORIF of proximal right femoral fracture Electronically Signed   By: Inez Catalina M.D.   On: 06/13/2019 14:12   DG Hip Unilat With Pelvis 2-3 Views Right  Result Date: 06/11/2019 CLINICAL DATA:  Fall today with right hip injury. Right hip pain. Tripped over slippers. EXAM: DG HIP (WITH OR WITHOUT PELVIS) 2-3V RIGHT COMPARISON:  None. FINDINGS: Acute intertrochanteric right proximal femur fracture. Fracture is  minimally displaced and involves both greater and lesser trochanters. Displacement is less than 1 cm. Femoral head remains seated in the acetabulum. Mild right hip osteoarthritis. Pubic rami are intact. No additional fracture of the pelvis. IMPRESSION: Acute minimally displaced intertrochanteric right femur fracture. Electronically Signed   By: Keith Rake M.D.   On: 06/11/2019 15:02   ECHOCARDIOGRAM LIMITED  Result Date: 06/15/2019    ECHOCARDIOGRAM LIMITED REPORT   Patient Name:   [name] Date of Exam: 06/14/2019 Medical Rec #:  [id number]        Height:       63.0 in Accession #:    [id number]       Weight:       158.0 lb Date of Birth:  1931-10-15        BSA:          1.749 m Patient Age:    89 years         BP:           127/54 mmHg Patient Gender: F                HR:           71 bpm. Exam Location:  [facility] Procedure: 2D Echo, Limited Echo, Limited Color Doppler, Cardiac Doppler and            Intracardiac Opacification Agent Indications:     R07.9 Chest pain  History:         Patient has prior history of Echocardiogram examinations, most                  recent 04/21/2019. Risk Factors:Hypertension and Diabetes.                  Syncope. Murmur. Paroxysmal atrial fibrillation.  Sonographer:     Wilford Sports Rodgers-Jones Referring Phys:  [id number] CHRISTOPHER END Diagnosing Phys: Nelva Bush MD  Sonographer Comments: Technically difficult study due to poor echo windows. IMPRESSIONS  1. Left ventricular ejection fraction, by estimation, is 55 to 60%. The left ventricle has normal function. The left ventricle demonstrates regional wall motion abnormalities (see scoring diagram/findings for description). There is mild left ventricular  hypertrophy. Left ventricular diastolic parameters are consistent with Grade I diastolic dysfunction (impaired relaxation). There is mild hypokinesis of the left ventricular, basal-mid inferior wall and inferoseptal wall.  2. There is moderately elevated pulmonary artery  systolic pressure. FINDINGS  Left Ventricle: Left ventricular ejection fraction, by estimation, is 55 to 60%. The left ventricle has normal function. The left ventricle demonstrates regional wall motion abnormalities. Mild hypokinesis of the left ventricular, basal-mid inferior wall and inferoseptal wall. Definity contrast agent was given IV to delineate the left ventricular endocardial borders. The left ventricular internal cavity size was normal in size. There is mild left ventricular hypertrophy. Right Ventricle: There is moderately elevated pulmonary  artery systolic pressure. Pericardium: There is no evidence of pericardial effusion. Tricuspid Valve: Tricuspid valve regurgitation is mild. Aorta: The aortic root is normal in size and structure. Venous: The inferior vena cava was not well visualized.  LEFT VENTRICLE PLAX 2D LVIDd:         4.19 cm Diastology LVIDs:         2.89 cm LV e' lateral:   7.07 cm/s LV PW:         0.87 cm LV E/e' lateral: 9.7 LV IVS:        1.03 cm LV e' medial:    5.44 cm/s                        LV E/e' medial:  12.7  RIGHT VENTRICLE RV S prime:     8.81 cm/s LEFT ATRIUM         Index LA diam:    3.90 cm 2.23 cm/m   AORTA Ao Root diam: 2.85 cm MITRAL VALVE               TRICUSPID VALVE MV Area (PHT): 2.95 cm    TR Peak grad:   44.6 mmHg MV Decel Time: 257 msec    TR Vmax:        334.00 cm/s MV E velocity: 68.90 cm/s MV A velocity: 91.20 cm/s MV E/A ratio:  0.76 Harrell Gave End MD Electronically signed by Nelva Bush MD Signature Date/Time: 06/15/2019/7:09:59 AM    Final     Subjective: Patient was feeling better when seen today.  She was accompanied by her daughter and would like to go to rehab facility.  No chest pain today.  No rash.  Discharge Exam: Vitals:   06/15/19 0003 06/15/19 0806  BP: 137/87 (!) 178/80  Pulse: 66 88  Resp: 18 18  Temp: 97.8 F (36.6 C) 98.3 F (36.8 C)  SpO2: 98% 99%   Vitals:   06/14/19 1136 06/14/19 1555 06/15/19 0003 06/15/19 0806  BP:  139/78 (!) 127/54 137/87 (!) 178/80  Pulse: 84 68 66 88  Resp: 18 16 18 18   Temp: 98.3 F (36.8 C)  97.8 F (36.6 C) 98.3 F (36.8 C)  TempSrc: Oral  Oral Oral  SpO2: 95% 97% 98% 99%  Weight:      Height:        General: Pt is alert, awake, not in acute distress Cardiovascular: RRR, S1/S2 +, no rubs, no gallops Respiratory: CTA bilaterally, no wheezing, no rhonchi Abdominal: Soft, NT, ND, bowel sounds + Extremities: no edema, no cyanosis   The results of significant diagnostics from this hospitalization (including imaging, microbiology, ancillary and laboratory) are listed below for reference.    Microbiology: Recent Results (from the past 240 hour(s))  Respiratory Panel by RT PCR (Flu A&B, Covid) - Nasopharyngeal Swab     Status: None   Collection Time: 06/11/19  3:41 PM   Specimen: Nasopharyngeal Swab  Result Value Ref Range Status   SARS Coronavirus 2 by RT PCR NEGATIVE NEGATIVE Final    Comment: (NOTE) SARS-CoV-2 target nucleic acids are NOT DETECTED. The SARS-CoV-2 RNA is generally detectable in upper respiratoy specimens during the acute phase of infection. The lowest concentration of SARS-CoV-2 viral copies this assay can detect is 131 copies/mL. A negative result does not preclude SARS-Cov-2 infection and should not be used as the sole basis for treatment or other patient management decisions. A negative result may occur with  improper specimen collection/handling, submission  of specimen other than nasopharyngeal swab, presence of viral mutation(s) within the areas targeted by this assay, and inadequate number of viral copies (<131 copies/mL). A negative result must be combined with clinical observations, patient history, and epidemiological information. The expected result is Negative. Fact Sheet for Patients:  PinkCheek.be Fact Sheet for Healthcare Providers:  GravelBags.it This test is not yet ap proved  or cleared by the [place] FDA and  has been authorized for detection and/or diagnosis of SARS-CoV-2 by FDA under an Emergency Use Authorization (EUA). This EUA will remain  in effect (meaning this test can be used) for the duration of the COVID-19 declaration under Section 564(b)(1) of the Act, 21 U.S.C. section 360bbb-3(b)(1), unless the authorization is terminated or revoked sooner.    Influenza A by PCR NEGATIVE NEGATIVE Final   Influenza B by PCR NEGATIVE NEGATIVE Final    Comment: (NOTE) The Xpert Xpress SARS-CoV-2/FLU/RSV assay is intended as an aid in  the diagnosis of influenza from Nasopharyngeal swab specimens and  should not be used as a sole basis for treatment. Nasal washings and  aspirates are unacceptable for Xpert Xpress SARS-CoV-2/FLU/RSV  testing. Fact Sheet for Patients: PinkCheek.be Fact Sheet for Healthcare Providers: GravelBags.it This test is not yet approved or cleared by the [place] FDA and  has been authorized for detection and/or diagnosis of SARS-CoV-2 by  FDA under an Emergency Use Authorization (EUA). This EUA will remain  in effect (meaning this test can be used) for the duration of the  Covid-19 declaration under Section 564(b)(1) of the Act, 21  U.S.C. section 360bbb-3(b)(1), unless the authorization is  terminated or revoked. Performed at [facility], [address] [place], [place] [address]   MRSA PCR Screening     Status: None   Collection Time: 06/12/19  2:24 PM   Specimen: Nasal Mucosa; Nasopharyngeal  Result Value Ref Range Status   MRSA by PCR NEGATIVE NEGATIVE Final    Comment:        The GeneXpert MRSA Assay (FDA approved for NASAL specimens only), is one component of a comprehensive MRSA colonization surveillance program. It is not intended to diagnose MRSA infection nor to guide or monitor treatment for MRSA infections. Performed at [facility], [address] [place], [place] [address]      Labs: BNP (last 3 results) No results for input(s): BNP in the last 8760 hours. Basic Metabolic Panel: Recent Labs  Lab 06/11/19 1513 06/11/19 1805 06/12/19 0446 06/14/19 0458 06/15/19 0513  NA 142 141 140 138 135  K 4.5 4.4 4.8 4.9 4.1  CL 107 108 106 104 104  CO2 27 25 26 24 25   GLUCOSE 102* 152* 134* 278* 221*  BUN 27* 25* 25* 26* 36*  CREATININE 0.79 0.63 0.90 0.76 0.79  CALCIUM 9.3 9.0 9.2 8.5* 8.1*   Liver Function Tests: No results for input(s): AST, ALT, ALKPHOS, BILITOT, PROT, ALBUMIN in the last 168 hours. No results for input(s): LIPASE, AMYLASE in the last 168 hours. No results for input(s): AMMONIA in the last 168 hours. CBC: Recent Labs  Lab 06/11/19 1513 06/12/19 0446 06/14/19 0458 06/15/19 0513  WBC 9.1 10.5 14.8* 10.9*  HGB 12.4 12.0 11.5* 10.6*  HCT 36.7 36.4 35.4* 32.3*  MCV 90.4 91.9 91.9 92.0  PLT 240 246 207 222   Cardiac Enzymes: No results for input(s): CKTOTAL, CKMB, CKMBINDEX, TROPONINI in the last 168 hours. BNP: Invalid input(s): POCBNP CBG: Recent Labs  Lab 06/14/19 1133 06/14/19  1629 06/14/19 2055 06/15/19 0807 06/15/19 1132  GLUCAP 198* 224* 225* 179* 182*   D-Dimer No results for input(s): DDIMER in the last 72 hours. Hgb A1c No results for input(s): HGBA1C in the last 72 hours. Lipid Profile No results for input(s): CHOL, HDL, LDLCALC, TRIG, CHOLHDL, LDLDIRECT in the last 72 hours. Thyroid function studies No results for input(s): TSH, T4TOTAL, T3FREE, THYROIDAB in the last 72 hours.  Invalid input(s): FREET3 Anemia work up No results for input(s): VITAMINB12, FOLATE, FERRITIN, TIBC, IRON, RETICCTPCT in the last 72 hours. Urinalysis    Component Value Date/Time   COLORURINE AMBER (A) 04/27/2018 0236   APPEARANCEUR CLOUDY (A) 04/27/2018 0236   APPEARANCEUR Clear 11/21/2015 0919   LABSPEC 1.023 04/27/2018 0236   PHURINE 5.0 04/27/2018 0236   GLUCOSEU  NEGATIVE 04/27/2018 0236   HGBUR NEGATIVE 04/27/2018 0236   BILIRUBINUR Neg 05/03/2018 1305   BILIRUBINUR Negative 11/21/2015 0919   KETONESUR 20 (A) 04/27/2018 0236   PROTEINUR Negative 05/03/2018 1305   PROTEINUR 30 (A) 04/27/2018 0236   UROBILINOGEN 0.2 05/03/2018 1305   NITRITE Neg 05/03/2018 1305   NITRITE NEGATIVE 04/27/2018 0236   LEUKOCYTESUR Negative 05/03/2018 1305   LEUKOCYTESUR SMALL (A) 04/27/2018 0236   Sepsis Labs Invalid input(s): PROCALCITONIN,  WBC,  LACTICIDVEN Microbiology Recent Results (from the past 240 hour(s))  Respiratory Panel by RT PCR (Flu A&B, Covid) - Nasopharyngeal Swab     Status: None   Collection Time: 06/11/19  3:41 PM   Specimen: Nasopharyngeal Swab  Result Value Ref Range Status   SARS Coronavirus 2 by RT PCR NEGATIVE NEGATIVE Final    Comment: (NOTE) SARS-CoV-2 target nucleic acids are NOT DETECTED. The SARS-CoV-2 RNA is generally detectable in upper respiratoy specimens during the acute phase of infection. The lowest concentration of SARS-CoV-2 viral copies this assay can detect is 131 copies/mL. A negative result does not preclude SARS-Cov-2 infection and should not be used as the sole basis for treatment or other patient management decisions. A negative result may occur with  improper specimen collection/handling, submission of specimen other than nasopharyngeal swab, presence of viral mutation(s) within the areas targeted by this assay, and inadequate number of viral copies (<131 copies/mL). A negative result must be combined with clinical observations, patient history, and epidemiological information. The expected result is Negative. Fact Sheet for Patients:  PinkCheek.be Fact Sheet for Healthcare Providers:  GravelBags.it This test is not yet ap proved or cleared by the [place] FDA and  has been authorized for detection and/or diagnosis of SARS-CoV-2 by FDA under an  Emergency Use Authorization (EUA). This EUA will remain  in effect (meaning this test can be used) for the duration of the COVID-19 declaration under Section 564(b)(1) of the Act, 21 U.S.C. section 360bbb-3(b)(1), unless the authorization is terminated or revoked sooner.    Influenza A by PCR NEGATIVE NEGATIVE Final   Influenza B by PCR NEGATIVE NEGATIVE Final    Comment: (NOTE) The Xpert Xpress SARS-CoV-2/FLU/RSV assay is intended as an aid in  the diagnosis of influenza from Nasopharyngeal swab specimens and  should not be used as a sole basis for treatment. Nasal washings and  aspirates are unacceptable for Xpert Xpress SARS-CoV-2/FLU/RSV  testing. Fact Sheet for Patients: PinkCheek.be Fact Sheet for Healthcare Providers: GravelBags.it This test is not yet approved or cleared by the [place] FDA and  has been authorized for detection and/or diagnosis of SARS-CoV-2 by  FDA under an Emergency Use Authorization (EUA). This EUA will remain  in effect (meaning this test can be used) for the duration of the  Covid-19 declaration under Section 564(b)(1) of the Act, 21  U.S.C. section 360bbb-3(b)(1), unless the authorization is  terminated or revoked. Performed at [facility], [address] [place], [place] [address]   MRSA PCR Screening     Status: None   Collection Time: 06/12/19  2:24 PM   Specimen: Nasal Mucosa; Nasopharyngeal  Result Value Ref Range Status   MRSA by PCR NEGATIVE NEGATIVE Final    Comment:        The GeneXpert MRSA Assay (FDA approved for NASAL specimens only), is one component of a comprehensive MRSA colonization surveillance program. It is not intended to diagnose MRSA infection nor to guide or monitor treatment for MRSA infections. Performed at [facility], [address] [place], [place] [address]     Time coordinating discharge: Over 30  minutes  SIGNED:  Lorella Nimrod, MD  [facility] 06/15/2019, 12:38 PM  If 7PM-7AM, please contact night-coverage [email or web address]  This record has been created using Systems analyst. Errors have been sought and corrected,but may not always be located. Such creation errors do not reflect on the standard of care.

## 2019-06-15 NOTE — Progress Notes (Signed)
   Subjective: 2 Days Post-Op Procedure(s) (LRB): INTRAMEDULLARY (IM) NAIL INTERTROCHANTRIC (Right) Patient reports pain as mild.   Patient is well, and has had no acute complaints or problems Denies any CP, SOB, ABD pain. We will continue with physical therapy today.    Objective: Vital signs in last 24 hours: Temp:  [97.8 F (36.6 C)-98.3 F (36.8 C)] 97.8 F (36.6 C) (04/07 0003) Pulse Rate:  [66-84] 66 (04/07 0003) Resp:  [16-18] 18 (04/07 0003) BP: (121-139)/(54-87) 137/87 (04/07 0003) SpO2:  [95 %-98 %] 98 % (04/07 0003)  Intake/Output from previous day: 04/06 0701 - 04/07 0700 In: 480 [P.O.:480] Out: 550 [Urine:550] Intake/Output this shift: No intake/output data recorded.  Recent Labs    06/14/19 0458 06/15/19 0513  HGB 11.5* 10.6*   Recent Labs    06/14/19 0458 06/15/19 0513  WBC 14.8* 10.9*  RBC 3.85* 3.51*  HCT 35.4* 32.3*  PLT 207 222   Recent Labs    06/14/19 0458 06/15/19 0513  NA 138 135  K 4.9 4.1  CL 104 104  CO2 24 25  BUN 26* 36*  CREATININE 0.76 0.79  GLUCOSE 278* 221*  CALCIUM 8.5* 8.1*   No results for input(s): LABPT, INR in the last 72 hours.  EXAM General - Patient is Alert, Appropriate and Oriented Extremity - Neurovascular intact Sensation intact distally Intact pulses distally Dorsiflexion/Plantar flexion intact No cellulitis present Compartment soft Dressing - dressing C/D/I and no drainage Motor Function - intact, moving foot and toes well on exam.   Past Medical History:  Diagnosis Date  . Chronic diarrhea   . Collagenous colitis   . Diabetes mellitus without complication ([place])   . Diverticulitis   . Heart murmur    at birth, none since  . History of echocardiogram    a. 11/2015: echo showing EF of 60-65% with no WMA. Grade 1 DD and mild MR noted.   . Hypertension   . PAF (paroxysmal atrial fibrillation) ([place])    a. initially diagnosed in 08/2016 --> started on Eliquis  . Syncope    a. initially  occurring in Fall 2016 b. Hospitalized in 10/2015 and 11/2015 for recurrent episodes.     Assessment/Plan:   2 Days Post-Op Procedure(s) (LRB): INTRAMEDULLARY (IM) NAIL INTERTROCHANTRIC (Right) Principal Problem:   Intertrochanteric fracture of right femur (HCC) Active Problems:   Essential hypertension   Diabetes mellitus, type 2 (HCC)   PAF (paroxysmal atrial fibrillation) (HCC)   Femur fracture, right ([place])   Fall  Estimated body mass index is 27.99 kg/m as calculated from the following:   Height as of this encounter: 5\' 3"  (1.6 m).   Weight as of this encounter: 71.7 kg. Advance diet Up with therapy  Needs bowel movement Labs stable Vital signs stable Pain well controlled Care manager to assist with discharge   Follow-up with [facility] orthopedics in 2 weeks for staple removal and Steri-Strip application. Continue with Eliquis TED hose bilateral lower extremities x6 weeks   DVT Prophylaxis - TED hose and SCDs, Eliquis Weight-Bearing as tolerated to right leg   T. Rachelle Hora, PA-C [place] 06/15/2019, 7:53 AM

## 2019-06-15 NOTE — Progress Notes (Signed)
Progress Note  Patient Name: [name] Date of Encounter: 06/15/2019  Primary Cardiologist: Nelva Bush, MD  Subjective   No recurrent c/p.  Working w/ PT this AM and was able to sit up @ bedside.  Still w/ Right hip pain, but overall improving.  Inpatient Medications    Scheduled Meds: . apixaban  5 mg Oral BID  . aspirin EC  81 mg Oral Daily  . cholecalciferol  2,000 Units Oral Daily  . docusate sodium  100 mg Oral BID  . feeding supplement (NEPRO CARB STEADY)  237 mL Oral Q24H  . insulin aspart  0-15 Units Subcutaneous TID WC  . insulin aspart  0-5 Units Subcutaneous QHS  . lidocaine  1 patch Transdermal Q24H  . lisinopril  5 mg Oral Daily  . metoprolol succinate  25 mg Oral Daily  . multivitamin with minerals  1 tablet Oral Daily  . simvastatin  20 mg Oral QHS   Continuous Infusions: . sodium chloride Stopped (06/14/19 0843)  . sodium chloride Stopped (06/14/19 0843)   PRN Meds: acetaminophen, alum & mag hydroxide-simeth, bisacodyl, furosemide, HYDROcodone-acetaminophen, magnesium citrate, magnesium hydroxide, menthol-cetylpyridinium **OR** phenol, methocarbamol, metoCLOPramide **OR** metoCLOPramide (REGLAN) injection, morphine injection, nitroGLYCERIN, ondansetron **OR** ondansetron (ZOFRAN) IV   Vital Signs    Vitals:   06/14/19 1136 06/14/19 1555 06/15/19 0003 06/15/19 0806  BP: 139/78 (!) 127/54 137/87 (!) 178/80  Pulse: 84 68 66 88  Resp: 18 16 18 18   Temp: 98.3 F (36.8 C)  97.8 F (36.6 C) 98.3 F (36.8 C)  TempSrc: Oral  Oral Oral  SpO2: 95% 97% 98% 99%  Weight:      Height:        Intake/Output Summary (Last 24 hours) at 06/15/2019 0951 Last data filed at 06/14/2019 2200 Gross per 24 hour  Intake 480 ml  Output --  Net 480 ml   Filed Weights   06/11/19 1359  Weight: 71.7 kg    Physical Exam   GEN: Well nourished, well developed, in no acute distress.  HEENT: Grossly normal.  Neck: Supple, no JVD, carotid bruits, or  masses. Cardiac: RRR, no murmurs, rubs, or gallops. No clubbing, cyanosis, edema.  Radials/DP/PT 2+ and equal bilaterally.  Respiratory:  Respirations regular and unlabored, clear to auscultation bilaterally. GI: Soft, nontender, nondistended, BS + x 4. MS: no deformity or atrophy. Skin: warm and dry, no rash. Neuro:  Strength and sensation are intact. Psych: AAOx3.  Normal affect.  Labs    Chemistry Recent Labs  Lab 06/12/19 0446 06/14/19 0458 06/15/19 0513  NA 140 138 135  K 4.8 4.9 4.1  CL 106 104 104  CO2 26 24 25   GLUCOSE 134* 278* 221*  BUN 25* 26* 36*  CREATININE 0.90 0.76 0.79  CALCIUM 9.2 8.5* 8.1*  GFRNONAA 58* >60 >60  GFRAA >60 >60 >60  ANIONGAP 8 10 6      Hematology Recent Labs  Lab 06/12/19 0446 06/14/19 0458 06/15/19 0513  WBC 10.5 14.8* 10.9*  RBC 3.96 3.85* 3.51*  HGB 12.0 11.5* 10.6*  HCT 36.4 35.4* 32.3*  MCV 91.9 91.9 92.0  MCH 30.3 29.9 30.2  MCHC 33.0 32.5 32.8  RDW 12.1 12.4 12.7  PLT 246 207 222    Cardiac Enzymes  Recent Labs  Lab 06/14/19 1228 06/14/19 1536 06/14/19 1750 06/14/19 2154 06/15/19 0534  TROPONINIHS 89* 98* 105* 76* 48*      Radiology    DG Chest 1 View  Result Date: 06/11/2019 CLINICAL  DATA:  Fall today with right hip fracture. EXAM: CHEST  1 VIEW COMPARISON:  04/27/2018 FINDINGS: Low lung volumes. Unchanged elevation of left hemidiaphragm with adjacent atelectasis or scarring. Normal heart size with unchanged mediastinal contours. Mild aortic atherosclerosis. No pulmonary edema, confluent airspace disease, large pleural effusion or pneumothorax. No acute osseous abnormalities are seen. IMPRESSION: Low lung volumes without acute abnormality. Chronic elevation of left hemidiaphragm. Aortic Atherosclerosis (ICD10-I70.0). Electronically Signed   By: Keith Rake M.D.   On: 06/11/2019 15:03   DG HIP OPERATIVE UNILAT W OR W/O PELVIS RIGHT  Result Date: 06/13/2019 CLINICAL DATA:  Proximal right femoral fracture EXAM:  OPERATIVE RIGHT HIP WITH PELVIS COMPARISON:  06/11/2019 FLUOROSCOPY TIME:  Radiation Exposure Index (as provided by the fluoroscopic device): Not available If the device does not provide the exposure index: Fluoroscopy Time:  36 seconds Number of Acquired Images:  4 FINDINGS: Medullary rod is noted with proximal fixation screw and distal fixation screw. The fracture fragments are in near anatomic alignment. IMPRESSION: ORIF of proximal right femoral fracture Electronically Signed   By: Inez Catalina M.D.   On: 06/13/2019 14:12   DG Hip Unilat With Pelvis 2-3 Views Right  Result Date: 06/11/2019 CLINICAL DATA:  Fall today with right hip injury. Right hip pain. Tripped over slippers. EXAM: DG HIP (WITH OR WITHOUT PELVIS) 2-3V RIGHT COMPARISON:  None. FINDINGS: Acute intertrochanteric right proximal femur fracture. Fracture is minimally displaced and involves both greater and lesser trochanters. Displacement is less than 1 cm. Femoral head remains seated in the acetabulum. Mild right hip osteoarthritis. Pubic rami are intact. No additional fracture of the pelvis. IMPRESSION: Acute minimally displaced intertrochanteric right femur fracture. Electronically Signed   By: Keith Rake M.D.   On: 06/11/2019 15:02   Telemetry    RSR, rare PVC - Personally Reviewed  Cardiac Studies   2D Echocardiogram 4.6.2021  1. Left ventricular ejection fraction, by estimation, is 55 to 60%. The  left ventricle has normal function. The left ventricle demonstrates  regional wall motion abnormalities (see scoring diagram/findings for  description). There is mild left ventricular   hypertrophy. Left ventricular diastolic parameters are consistent with  Grade I diastolic dysfunction (impaired relaxation). There is mild  hypokinesis of the left ventricular, basal-mid inferior wall and  inferoseptal wall.   2. There is moderately elevated pulmonary artery systolic pressure.    Patient Profile     86 y.o. female w/ a  h/o PAF, HTN, HL, DMII, and recurrent syncope, who was admitted 06/13/2019 following fall and R intertrochanteric hip rx s/p IM nail, who developed sharp c/p on 4/6 (relieved w/ lidocaine patch).  Echo w/ nl EF.  Assessment & Plan    1.  Atypical chest pain/HsTroponin elevation: pt developed sharp L chest pain on 4/6.  Ss improved w/ lidocaine patch.  HsTrop mildly elevated w/ flat trend (peak 105), which is likely non-specific and does not represent ACS.  Limited echo w/ nl EF.  Mild basal-mid inf, infsept HK reviewed by Dr. Garen Lah this morning and not felt to be significant.  No recurrent chest pain.  Suspect non-cardiac/msk c/p.  No plan for further ischemic eval at this time.  Cont  blocker, statin.  D/c ASA in setting of chronic eliquis.  2.  PAF:  Maintaining sinus.  Cont  blocker and eliquis.  3.  Essential HTN:  Stable on  blocker and acei.  3.  HL:  LDL 58 08/2018 on statin rx.  4.  DMII: A1c 7.0.  Per IM.  5. R hip Fx: s/p IM nail.  Per ortho.  6.  Moderately elevated PA pressure:  Noted on echo.  Not present on prior echo.  No dyspnea.  No tachycardia, hypoxia, dyspnea, or ongoing c/p to suggest PE.  Euvolemic on exam and net neg this admission.    Signed, Murray Hodgkins, NP  06/15/2019, 9:51 AM    For questions or updates, please contact   Please consult [email or web address] for contact info under Cardiology/STEMI.

## 2019-06-16 LAB — GLUCOSE, CAPILLARY: Glucose-Capillary: 188 mg/dL — ABNORMAL HIGH (ref 70–99)

## 2019-06-16 NOTE — Progress Notes (Signed)
   Subjective: 3 Days Post-Op Procedure(s) (LRB): INTRAMEDULLARY (IM) NAIL INTERTROCHANTRIC (Right) Patient reports pain as mild.   Patient is well, and has had no acute complaints or problems Denies any CP, SOB, ABD pain. We will continue with physical therapy today.    Objective: Vital signs in last 24 hours: Temp:  [98.6 F (37 C)-98.8 F (37.1 C)] 98.6 F (37 C) (04/08 0736) Pulse Rate:  [88-106] 88 (04/08 0736) Resp:  [16-17] 16 (04/08 0736) BP: (121-157)/(53-78) 157/78 (04/08 0736) SpO2:  [89 %-98 %] 92 % (04/08 0736)  Intake/Output from previous day: 04/07 0701 - 04/08 0700 In: 120 [P.O.:120] Out: 1000 [Urine:1000] Intake/Output this shift: No intake/output data recorded.  Recent Labs    06/14/19 0458 06/15/19 0513  HGB 11.5* 10.6*   Recent Labs    06/14/19 0458 06/15/19 0513  WBC 14.8* 10.9*  RBC 3.85* 3.51*  HCT 35.4* 32.3*  PLT 207 222   Recent Labs    06/14/19 0458 06/15/19 0513  NA 138 135  K 4.9 4.1  CL 104 104  CO2 24 25  BUN 26* 36*  CREATININE 0.76 0.79  GLUCOSE 278* 221*  CALCIUM 8.5* 8.1*   No results for input(s): LABPT, INR in the last 72 hours.  EXAM General - Patient is Alert, Appropriate and Oriented Extremity - Neurovascular intact Sensation intact distally Intact pulses distally Dorsiflexion/Plantar flexion intact No cellulitis present Compartment soft Dressing - dressing C/D/I and no drainage Motor Function - intact, moving foot and toes well on exam.   Past Medical History:  Diagnosis Date  . Chronic diarrhea   . Collagenous colitis   . Diabetes mellitus without complication ([place])   . Diverticulitis   . Heart murmur    at birth, none since  . History of echocardiogram    a. 11/2015: echo showing EF of 60-65% with no WMA. Grade 1 DD and mild MR noted.   . Hypertension   . PAF (paroxysmal atrial fibrillation) ([place])    a. initially diagnosed in 08/2016 --> started on Eliquis  . Syncope    a. initially occurring  in Fall 2016 b. Hospitalized in 10/2015 and 11/2015 for recurrent episodes.     Assessment/Plan:   3 Days Post-Op Procedure(s) (LRB): INTRAMEDULLARY (IM) NAIL INTERTROCHANTRIC (Right) Principal Problem:   Intertrochanteric fracture of right femur (HCC) Active Problems:   Essential hypertension   Diabetes mellitus, type 2 (HCC)   PAF (paroxysmal atrial fibrillation) (HCC)   Femur fracture, right (HCC)   Fall   Chest pain of uncertain etiology   Closed fracture of right hip ([place])  Estimated body mass index is 27.99 kg/m as calculated from the following:   Height as of this encounter: 5\' 3"  (1.6 m).   Weight as of this encounter: 71.7 kg. Advance diet Up with therapy  Labs stable Vital signs stable Pain well controlled Care manager to assist with discharge to [facility] today   Follow-up with [facility] orthopedics in 2 weeks for staple removal and Steri-Strip application. Continue with Eliquis TED hose bilateral lower extremities x6 weeks   DVT Prophylaxis - TED hose and SCDs, Eliquis Weight-Bearing as tolerated to right leg   T. Rachelle Hora, PA-C [place] 06/16/2019, 10:00 AM

## 2019-06-16 NOTE — TOC Transition Note (Signed)
Transition of Care [facility]) - CM/SW Discharge Note   Patient Details  Name: [name] MRN: [id number] Date of Birth: 30-Dec-1931  Transition of Care [facility]) CM/SW Contact:  Su Hilt, RN Phone Number: 06/16/2019, 9:35 AM   Clinical Narrative:     The patient is discharging to [facility] to be transported via EMS, The bedside nurse to call report to [facility] has called EMS to arrange transport, The bedside nurse is aware and the patient will be ready        Patient Goals and CMS Choice        Discharge Placement                       Discharge Plan and Services                                     Social Determinants of Health (SDOH) Interventions     Readmission Risk Interventions No flowsheet data found.

## 2019-06-16 NOTE — TOC Progression Note (Signed)
Transition of Care [facility]) - Progression Note    Patient Details  Name: SULAF FINLEY MRN: [id number] Date of Birth: 02/19/1932  Transition of Care [facility]) CM/SW [place], RN Phone Number: 06/16/2019, 8:27 AM  Clinical Narrative:     Josem Kaufmann approval received [place] approved 3 days next review date 3/9 [id number], ref number [id number], will DC to [facility] with EMS for DC      Expected Discharge Plan and Services           Expected Discharge Date: 06/15/19                                     Social Determinants of Health (SDOH) Interventions    Readmission Risk Interventions No flowsheet data found.

## 2019-06-16 NOTE — Progress Notes (Signed)
Report called and given to [facility] at [facility]. IV removed. Dressing changed. EMS here to transport patient. Vitals stable.

## 2019-06-17 DIAGNOSIS — E1159 Type 2 diabetes mellitus with other circulatory complications: Secondary | ICD-10-CM

## 2019-06-17 DIAGNOSIS — E785 Hyperlipidemia, unspecified: Secondary | ICD-10-CM

## 2019-06-17 DIAGNOSIS — I10 Essential (primary) hypertension: Secondary | ICD-10-CM

## 2019-06-17 DIAGNOSIS — S7291XA Unspecified fracture of right femur, initial encounter for closed fracture: Secondary | ICD-10-CM

## 2019-06-17 DIAGNOSIS — E039 Hypothyroidism, unspecified: Secondary | ICD-10-CM

## 2019-06-24 DIAGNOSIS — E119 Type 2 diabetes mellitus without complications: Secondary | ICD-10-CM

## 2019-06-24 DIAGNOSIS — B351 Tinea unguium: Secondary | ICD-10-CM

## 2019-06-30 DIAGNOSIS — N751 Abscess of Bartholin's gland: Secondary | ICD-10-CM | POA: Diagnosis not present

## 2019-07-14 DIAGNOSIS — I10 Essential (primary) hypertension: Secondary | ICD-10-CM

## 2019-07-14 DIAGNOSIS — I480 Paroxysmal atrial fibrillation: Secondary | ICD-10-CM

## 2019-07-14 DIAGNOSIS — S72001A Fracture of unspecified part of neck of right femur, initial encounter for closed fracture: Secondary | ICD-10-CM

## 2019-07-14 DIAGNOSIS — E1159 Type 2 diabetes mellitus with other circulatory complications: Secondary | ICD-10-CM

## 2019-09-23 ENCOUNTER — Other Ambulatory Visit: Payer: Self-pay | Admitting: Family Medicine

## 2019-09-23 DIAGNOSIS — E119 Type 2 diabetes mellitus without complications: Secondary | ICD-10-CM

## 2019-09-23 NOTE — Telephone Encounter (Signed)
Call to patient to schedule medication follow up appointment. Patient wants to update PCP:    Patient is now resident of [facility]. She has had surgery in March to L leg for arthritis and then she fell in April and broke her R leg. She had to have surgery on that leg and she is having PT currently. Patient states she has her labs tested at the facility and she wants to know if PCP has access to them and if she can possibly schedule her CPE in September when it would be more convenient for her daughter to bring her. Patient states she has had a traumatic year with having to leave her home of 60 years and then having the leg injury. Please call her if she can wait and be seen in September and continue to get her RF.

## 2019-09-23 NOTE — Telephone Encounter (Signed)
Please review for Dennis.    Thanks,   -Quintina Hakeem  

## 2019-10-11 NOTE — Progress Notes (Signed)
Follow-up Outpatient Visit Date: 10/12/2019  Primary Care Provider: Margo Common, [place] [address]  Chief Complaint: Follow-up paroxysmal atrial fibrillation and recent hospitalization  HPI:  Ms. Sundberg is a 86 y.o. female with history of paroxysmal atrial fibrillation, hypertension, hyperlipidemia, type 2 diabetes mellitus, and recurrent syncope thought to be vasovagal in nature,, who presents for follow-up of atrial fibrillation and syncope.  I last saw her in the office in early February, at which time Ms. Silver was doing relatively well though she noted a mechanical fall shortly before that while visiting [place] with her daughter.  She was subsequently hospitalized in April due to a recurrent fall leading to right intertrochanteric femur fracture.  She underwent operative fixation and subsequently developed left-sided chest pain in the postoperative period.  Pain actually resolved with application of a lidocaine patch.  Minimal nonspecific troponin elevation was noted.  Echo showed normal LVEF with mild hypokinesis of the basal and mid inferior/inferoseptal walls.  Inpatient ischemia evaluation was deferred.  Today, Ms. Heidemann reports that she is feeling relatively well.  She still has some pain in the right thigh and hip.  She also notes intermittent swelling of her right ankle, where she had sustained an injury many years ago.  She has not had any further chest pain nor shortness of breath, palpitations, or lightheadedness.  She is ambulating with a rolling walker and denies further falls.  She remains on apixaban without bleeding.  --------------------------------------------------------------------------------------------------  Past Medical History:  Diagnosis Date  . Chronic diarrhea   . Collagenous colitis   . Diabetes mellitus without complication ([place])   . Diverticulitis   . Heart murmur    at birth, none since  . History of  echocardiogram    a. 11/2015: echo showing EF of 60-65% with no WMA. Grade 1 DD and mild MR noted.   . Hypertension   . PAF (paroxysmal atrial fibrillation) ([place])    a. initially diagnosed in 08/2016 --> started on Eliquis  . Syncope    a. initially occurring in Fall 2016 b. Hospitalized in 10/2015 and 11/2015 for recurrent episodes.    Past Surgical History:  Procedure Laterality Date  . ABDOMINAL HYSTERECTOMY    . BACK SURGERY  08/08/2008   Lumbar discectomy  . COLONOSCOPY WITH PROPOFOL    . EXCISION/RELEASE BURSA HIP Left 05/03/2019   Procedure: HIP ABDUCTOR REPAIR;  Surgeon: Hessie Knows, MD;  Location: [facility] ORS;  Service: Orthopedics;  Laterality: Left;  . INTRAMEDULLARY (IM) NAIL INTERTROCHANTERIC Right 06/13/2019   Procedure: INTRAMEDULLARY (IM) NAIL INTERTROCHANTRIC;  Surgeon: Hessie Knows, MD;  Location: [facility] ORS;  Service: Orthopedics;  Laterality: Right;  . NECK SURGERY  1980    Current Meds  Medication Sig  . acetaminophen (TYLENOL) 500 MG tablet Take 1,000 mg by mouth 2 (two) times daily as needed (pain.).  Marland Kitchen apixaban (ELIQUIS) 5 MG TABS tablet Take 1 tablet (5 mg total) by mouth 2 (two) times daily.  . Cholecalciferol (VITAMIN D) 2000 UNITS CAPS Take 2,000 Units by mouth daily.   . diphenhydramine-acetaminophen (TYLENOL PM) 25-500 MG TABS tablet Take 1 tablet by mouth at bedtime as needed (sleep.).   Marland Kitchen furosemide (LASIX) 20 MG tablet TAKE ONE TABLET BY MOUTH DAILY AS NEEDED (Patient taking differently: Take 20 mg by mouth daily as needed (fluid retention.). )  . glipiZIDE (GLUCOTROL XL) 5 MG 24 hr tablet TAKE ONE TABLET BY MOUTH DAILY WITH BREAKFAST (Patient taking differently: Take 5 mg by  mouth daily with breakfast. TAKE ONE TABLET BY MOUTH DAILY WITH BREAKFAST.)  . lisinopril (ZESTRIL) 10 MG tablet Take 1 tablet (10 mg total) by mouth daily.  . metFORMIN (GLUCOPHAGE) 500 MG tablet TAKE 1 TABLET BY MOUTH TWICE DAILY WITH MEALS  . metoprolol succinate (TOPROL-XL) 25 MG 24  hr tablet Take 1 tablet (25 mg total) by mouth daily.  . Multiple Vitamin (MULTIVITAMIN WITH MINERALS) TABS tablet Take 1 tablet by mouth daily.  . simvastatin (ZOCOR) 20 MG tablet TAKE ONE TABLET BY MOUTH EVERY NIGHT AT BEDTIME (Patient taking differently: Take 20 mg by mouth at bedtime. )  . [DISCONTINUED] lisinopril (ZESTRIL) 5 MG tablet Take 5 mg by mouth daily.    Allergies: Morphine sulfate and Penicillins  Social History   Tobacco Use  . Smoking status: Former Smoker    Quit date: 03/09/1978    Years since quitting: 41.6  . Smokeless tobacco: Never Used  Vaping Use  . Vaping Use: Never used  Substance Use Topics  . Alcohol use: Yes    Alcohol/week: 0.0 - 1.0 standard drinks  . Drug use: No    Family History  Problem Relation Age of Onset  . Hypertension Mother   . Diabetes Mother   . Lung cancer Father     Review of Systems: A 12-system review of systems was performed and was negative except as noted in the HPI.  --------------------------------------------------------------------------------------------------  Physical Exam: BP (!) 150/74 (BP Location: Left Arm, Patient Position: Sitting, Cuff Size: Normal)   Pulse 62   Ht 5\' 3"  (1.6 m)   Wt 152 lb 12.8 oz (69.3 kg)   SpO2 98%   BMI 27.07 kg/m   Repeat BP 142/68  General: NAD.  Accompanied by her daughter. Neck: No JVD or HJR. Lungs: Normal work of breathing. Clear to auscultation bilaterally without wheezes or crackles. Heart: Regular rate and rhythm without murmurs, rubs, or gallops. Non-displaced PMI. Abd: Bowel sounds present. Soft, NT/ND without hepatosplenomegaly Ext: No significant lower extremity edema. Skin: Warm and dry without rash.  EKG: Normal sinus rhythm without abnormality.  Lab Results  Component Value Date   WBC 10.9 (H) 06/15/2019   HGB 10.6 (L) 06/15/2019   HCT 32.3 (L) 06/15/2019   MCV 92.0 06/15/2019   PLT 222 06/15/2019    Lab Results  Component Value Date   NA 135  06/15/2019   K 4.1 06/15/2019   CL 104 06/15/2019   CO2 25 06/15/2019   BUN 36 (H) 06/15/2019   CREATININE 0.79 06/15/2019   GLUCOSE 221 (H) 06/15/2019   ALT 30 12/21/2018    Lab Results  Component Value Date   CHOL 149 12/21/2018   HDL 52 12/21/2018   LDLCALC 69 12/21/2018   TRIG 163 (H) 12/21/2018   CHOLHDL 2.9 12/21/2018   --------------------------------------------------------------------------------------------------  ASSESSMENT AND PLAN: Paroxysmal atrial fibrillation: Ms. Signer is maintaining sinus rhythm.  We discussed the risks and benefits of continued anticoagulation, given her recent falls.  She would like to remain on apixaban 5 mg twice daily.  Abnormal echocardiogram and elevated troponin: Ms. Buonocore was noted to have transient chest pain following her right hip fracture and surgical repair in April.  Modest high-sensitivity troponin elevation was observed, peaking at 105.  Echocardiogram at that time showed possible inferolateral hypokinesis.  Stress test was deferred to allow for recovery from her acute traumatic injury.  Ms. Yeomans has not had any further chest pain.  However, given troponin elevation and abnormal echocardiogram, we have  agreed to perform a pharmacologic myocardial perfusion stress test at her convenience.  Hypertension: Blood pressure suboptimally controlled today.  We will increase lisinopril to 20 mg daily and obtain a basic metabolic panel in about 2 weeks.  Follow-up: Return to clinic in 3 months.  Nelva Bush, MD 10/12/2019 12:15 PM

## 2019-10-12 ENCOUNTER — Encounter: Payer: Self-pay | Admitting: Internal Medicine

## 2019-10-12 ENCOUNTER — Ambulatory Visit (INDEPENDENT_AMBULATORY_CARE_PROVIDER_SITE_OTHER): Payer: Medicare Other | Admitting: Internal Medicine

## 2019-10-12 ENCOUNTER — Other Ambulatory Visit: Payer: Self-pay

## 2019-10-12 VITALS — BP 150/74 | HR 62 | Ht 63.0 in | Wt 152.8 lb

## 2019-10-12 DIAGNOSIS — R778 Other specified abnormalities of plasma proteins: Secondary | ICD-10-CM | POA: Insufficient documentation

## 2019-10-12 DIAGNOSIS — R7989 Other specified abnormal findings of blood chemistry: Secondary | ICD-10-CM | POA: Insufficient documentation

## 2019-10-12 DIAGNOSIS — I480 Paroxysmal atrial fibrillation: Secondary | ICD-10-CM

## 2019-10-12 DIAGNOSIS — I10 Essential (primary) hypertension: Secondary | ICD-10-CM | POA: Diagnosis not present

## 2019-10-12 DIAGNOSIS — R931 Abnormal findings on diagnostic imaging of heart and coronary circulation: Secondary | ICD-10-CM | POA: Insufficient documentation

## 2019-10-12 MED ORDER — LISINOPRIL 20 MG PO TABS: 20.0000 mg | ORAL_TABLET | Freq: Every day | ORAL | 1 refills | Status: DC

## 2019-10-12 NOTE — Patient Instructions (Signed)
Medication Instructions:  Your physician has recommended you make the following change in your medication:  1- INCREASE Lisinopril to 20 mg by mouth once a day.  *If you need a refill on your cardiac medications before your next appointment, please call your pharmacy*  Lab Work: Your physician recommends that you return for lab work in: 2 weeks around 10/26/19 on the same morning as your Stress test. BMET. - Please go to the [facility]. You will check in at the front desk to the right as you walk into the atrium. Valet Parking is offered if needed. - No appointment needed. You may go any day between 7 am and 6 pm.  If you have labs (blood work) drawn today and your tests are completely normal, you will receive your results only by:  [place] (if you have MyChart) OR  A paper copy in the mail If you have any lab test that is abnormal or we need to change your treatment, we will call you to review the results.  Testing/Procedures: Your physician has requested that you have a lexiscan myoview.  [place]  Your caregiver has ordered a Stress Test with nuclear imaging. The purpose of this test is to evaluate the blood supply to your heart muscle. This procedure is referred to as a "Non-Invasive Stress Test." This is because other than having an IV started in your vein, nothing is inserted or "invades" your body. Cardiac stress tests are done to find areas of poor blood flow to the heart by determining the extent of coronary artery disease (CAD). Some patients exercise on a treadmill, which naturally increases the blood flow to your heart, while others who are  unable to walk on a treadmill due to physical limitations have a pharmacologic/chemical stress agent called Lexiscan . This medicine will mimic walking on a treadmill by temporarily increasing your coronary blood flow.   Please note: these test may take anywhere between 2-4 hours to complete  PLEASE REPORT TO [facility] AT THE FIRST DESK WILL DIRECT YOU WHERE TO GO  Date of Procedure:_____________________________________  Arrival Time for Procedure:______________________________  Instructions regarding medication:   _XX_ : Hold diabetes medication morning of procedure - GLYPIZIDE  _XX_:  Hold other medications as follows:_________________FUROSEMIDE___________  PLEASE NOTIFY THE OFFICE AT LEAST 24 HOURS IN ADVANCE IF YOU ARE UNABLE TO KEEP YOUR APPOINTMENT.  [phone] AND  PLEASE NOTIFY NUCLEAR MEDICINE AT [facility] AT LEAST 24 HOURS IN ADVANCE IF YOU ARE UNABLE TO KEEP YOUR APPOINTMENT. [phone]  How to prepare for your Myoview test:  1. Do not eat or drink after midnight 2. No caffeine for 24 hours prior to test 3. No smoking 24 hours prior to test. 4. Your medication may be taken with water.  If your doctor stopped a medication because of this test, do not take that medication. 5. Ladies, please do not wear dresses.  Skirts or pants are appropriate. Please wear a short sleeve shirt. 6. No perfume, cologne or lotion. 7. Wear comfortable walking shoes. No heels!   Follow-Up: At [facility], you and your health needs are our priority.  As part of our continuing mission to provide you with exceptional heart care, we have created designated Provider Care Teams.  These Care Teams include your primary Cardiologist (physician) and Advanced Practice Providers (APPs -  Physician Assistants and Nurse Practitioners) who all work together to provide you with the care you need, when you need  it.  We recommend signing up for the patient portal called "MyChart".  Sign up information is provided on this After Visit Summary.  MyChart is used to connect with patients for Virtual Visits (Telemedicine).  Patients are able to view lab/test results, encounter notes, upcoming appointments, etc.  Non-urgent messages can be sent to your provider as well.   To learn more about what  you can do with MyChart, go to [email or web address].    Your next appointment:   3 month(s)  The format for your next appointment:   In Person  Provider:    You may see Nelva Bush, MD or one of the following Advanced Practice Providers on your designated Care Team:    Murray Hodgkins, NP  Christell Faith, PA-C  Marrianne Mood, PA-C    Cardiac Nuclear Scan A cardiac nuclear scan is a test that measures blood flow to the heart when a person is resting and when he or she is exercising. The test looks for problems such as:  Not enough blood reaching a portion of the heart.  The heart muscle not working normally. You may need this test if:  You have heart disease.  You have had abnormal lab results.  You have had heart surgery or a balloon procedure to open up blocked arteries (angioplasty).  You have chest pain.  You have shortness of breath. In this test, a radioactive dye (tracer) is injected into your bloodstream. After the tracer has traveled to your heart, an imaging device is used to measure how much of the tracer is absorbed by or distributed to various areas of your heart. This procedure is usually done at a hospital and takes 2-4 hours. Tell a health care provider about:  Any allergies you have.  All medicines you are taking, including vitamins, herbs, eye drops, creams, and over-the-counter medicines.  Any problems you or family members have had with anesthetic medicines.  Any blood disorders you have.  Any surgeries you have had.  Any medical conditions you have.  Whether you are pregnant or may be pregnant. What are the risks? Generally, this is a safe procedure. However, problems may occur, including:  Serious chest pain and heart attack. This is only a risk if the stress portion of the test is done.  Rapid heartbeat.  Sensation of warmth in your chest. This usually passes quickly.  Allergic reaction to the tracer. What happens before the  procedure?  Ask your health care provider about changing or stopping your regular medicines. This is especially important if you are taking diabetes medicines or blood thinners.  Follow instructions from your health care provider about eating or drinking restrictions.  Remove your jewelry on the day of the procedure. What happens during the procedure?  An IV will be inserted into one of your veins.  Your health care provider will inject a small amount of radioactive tracer through the IV.  You will wait for 20-40 minutes while the tracer travels through your bloodstream.  Your heart activity will be monitored with an electrocardiogram (ECG).  You will lie down on an exam table.  Images of your heart will be taken for about 15-20 minutes.  You may also have a stress test. For this test, one of the following may be done: ? You will exercise on a treadmill or stationary bike. While you exercise, your heart's activity will be monitored with an ECG, and your blood pressure will be checked. ? You will be given medicines that will increase  blood flow to parts of your heart. This is done if you are unable to exercise.  When blood flow to your heart has peaked, a tracer will again be injected through the IV.  After 20-40 minutes, you will get back on the exam table and have more images taken of your heart.  Depending on the type of tracer used, scans may need to be repeated 3-4 hours later.  Your IV line will be removed when the procedure is over. The procedure may vary among health care providers and hospitals. What happens after the procedure?  Unless your health care provider tells you otherwise, you may return to your normal schedule, including diet, activities, and medicines.  Unless your health care provider tells you otherwise, you may increase your fluid intake. This will help to flush the contrast dye from your body. Drink enough fluid to keep your urine pale yellow.  Ask your  health care provider, or the department that is doing the test: ? When will my results be ready? ? How will I get my results? Summary  A cardiac nuclear scan measures the blood flow to the heart when a person is resting and when he or she is exercising.  Tell your health care provider if you are pregnant.  Before the procedure, ask your health care provider about changing or stopping your regular medicines. This is especially important if you are taking diabetes medicines or blood thinners.  After the procedure, unless your health care provider tells you otherwise, increase your fluid intake. This will help flush the contrast dye from your body.  After the procedure, unless your health care provider tells you otherwise, you may return to your normal schedule, including diet, activities, and medicines. This information is not intended to replace advice given to you by your health care provider. Make sure you discuss any questions you have with your health care provider. Document Revised: 08/10/2017 Document Reviewed: 08/10/2017 Elsevier Patient Education  [place].

## 2019-10-26 ENCOUNTER — Other Ambulatory Visit: Payer: Self-pay

## 2019-10-26 ENCOUNTER — Encounter
Admission: RE | Admit: 2019-10-26 | Discharge: 2019-10-26 | Disposition: A | Discharge status: Home / Self Care | Payer: Medicare Other | Source: Ambulatory Visit | Attending: Internal Medicine | Admitting: Internal Medicine

## 2019-10-26 ENCOUNTER — Other Ambulatory Visit
Admission: RE | Admit: 2019-10-26 | Discharge: 2019-10-26 | Disposition: A | Discharge status: Home / Self Care | Payer: Medicare Other | Source: Home / Self Care | Attending: Internal Medicine | Admitting: Internal Medicine

## 2019-10-26 DIAGNOSIS — R931 Abnormal findings on diagnostic imaging of heart and coronary circulation: Secondary | ICD-10-CM | POA: Insufficient documentation

## 2019-10-26 DIAGNOSIS — I480 Paroxysmal atrial fibrillation: Secondary | ICD-10-CM | POA: Insufficient documentation

## 2019-10-26 LAB — BASIC METABOLIC PANEL
Anion gap: 13 (ref 5–15)
BUN: 27 mg/dL — ABNORMAL HIGH (ref 8–23)
CO2: 26 mmol/L (ref 22–32)
Calcium: 9.8 mg/dL (ref 8.9–10.3)
Chloride: 100 mmol/L (ref 98–111)
Creatinine, Ser: 0.88 mg/dL (ref 0.44–1.00)
GFR calc Af Amer: >60 mL/min (ref >60)
GFR calc non Af Amer: 59 mL/min — ABNORMAL LOW (ref >60)
Glucose, Bld: 174 mg/dL — ABNORMAL HIGH (ref 70–99)
Potassium: 3.9 mmol/L (ref 3.5–5.1)
Sodium: 139 mmol/L (ref 135–145)

## 2019-10-26 LAB — NM MYOCAR MULTI W/SPECT W/WALL MOTION / EF
LV dias vol: 61 mL (ref 46–106)
LV sys vol: 15 mL
Peak HR: 94 {beats}/min
Percent HR: 70 %
Rest HR: 84 {beats}/min
SDS: 0
SRS: 0
SSS: 0
TID: 0.83

## 2019-10-26 MED ORDER — TECHNETIUM TC 99M TETROFOSMIN IV KIT
30.0000 | PACK | Freq: Once | INTRAVENOUS | Status: AC | PRN
  Administered 2019-10-26: 29.813 via INTRAVENOUS

## 2019-10-26 MED ORDER — REGADENOSON 0.4 MG/5ML IV SOLN
0.4000 mg | Freq: Once | INTRAVENOUS | Status: AC
  Administered 2019-10-26: 0.4 mg via INTRAVENOUS

## 2019-10-26 MED ORDER — TECHNETIUM TC 99M TETROFOSMIN IV KIT
10.2510 | PACK | Freq: Once | INTRAVENOUS | Status: AC | PRN
  Administered 2019-10-26: 10.251 via INTRAVENOUS

## 2019-11-09 ENCOUNTER — Other Ambulatory Visit: Payer: Self-pay

## 2019-11-09 ENCOUNTER — Ambulatory Visit: Payer: Medicare Other | Admitting: Dermatology

## 2019-11-09 ENCOUNTER — Telehealth: Payer: Self-pay | Admitting: Family Medicine

## 2019-11-09 DIAGNOSIS — L409 Psoriasis, unspecified: Secondary | ICD-10-CM | POA: Diagnosis not present

## 2019-11-09 DIAGNOSIS — L578 Other skin changes due to chronic exposure to nonionizing radiation: Secondary | ICD-10-CM | POA: Diagnosis not present

## 2019-11-09 DIAGNOSIS — L570 Actinic keratosis: Secondary | ICD-10-CM

## 2019-11-09 MED ORDER — FLUOCINOLONE ACETONIDE SCALP 0.01 % EX OIL: TOPICAL_OIL | CUTANEOUS | 2 refills | Status: DC

## 2019-11-09 MED ORDER — CLOBETASOL PROPIONATE 0.05 % EX SHAM: MEDICATED_SHAMPOO | CUTANEOUS | 3 refills | Status: DC

## 2019-11-09 NOTE — Telephone Encounter (Signed)
Ok to order 

## 2019-11-09 NOTE — Progress Notes (Signed)
   Follow-Up Visit   Subjective  [name] is a 86 y.o. female who presents for the following: Skin Problem (Redness on nose. Several months. Gets white spots. Patient picks at and they bleed.) and Rash (Posterior scalp. C/O dry scaly area. Has been using Clobetasol solution, not helping. ).  Itchy.    The following portions of the chart were reviewed this encounter and updated as appropriate:     Review of Systems: No other skin or systemic complaints except as noted in HPI or Assessment and Plan.  Objective  Well appearing patient in no apparent distress; mood and affect are within normal limits.  A focused examination was performed including head, including the scalp, face, neck, nose, ears, eyelids, and lips. Relevant physical exam findings are noted in the Assessment and Plan.  Objective  posterior scalp: Well-demarcated erythematous papules/plaques with silvery scale, guttate pink scaly papules.   Objective  Nose x9, left malar cheek x2, forehead x4, upper cutaneous lip x2, left dorsal hand x3, right dorsal hand x1 (21): Erythematous thin papules/macules with gritty scale.   Assessment & Plan  Psoriasis posterior scalp  Dermasmoothe oil qhs to scalp , cover with shower cap and leave on o/n. Wash out in [employer] clobetasol shampoo.  Apply to dry scalp 20 minutes prior to shower then wash out when shampoo hair.  For intense treatment do both daily/nightly for up to 2 weeks, then 2 times/wk thereafter  Clobetasol Propionate 0.05 % shampoo - posterior scalp  Fluocinolone Acetonide Scalp 0.01 % OIL - posterior scalp  AK (actinic keratosis) (21) Nose x9, left malar cheek x2, forehead x4, upper cutaneous lip x2, left dorsal hand x3, right dorsal hand x1  Destruction of lesion - Nose x9, left malar cheek x2, forehead x4, upper cutaneous lip x2, left dorsal hand x3, right dorsal hand x1  Destruction method: cryotherapy   Informed consent: discussed and consent  obtained   Lesion destroyed using liquid nitrogen: Yes   Region frozen until ice ball extended beyond lesion: Yes   Outcome: patient tolerated procedure well with no complications   Post-procedure details: wound care instructions given    Actinic Damage - diffuse scaly erythematous macules with underlying dyspigmentation - Recommend daily broad spectrum sunscreen SPF 30+ to sun-exposed areas, reapply every 2 hours as needed.  - Call for new or changing lesions.   Return in about 3 months (around 02/08/2020) for AK recheck and psoriasis recheck.   I, Emelia Salisbury, CMA, am acting as scribe for Brendolyn Patty, MD.  Documentation: I have reviewed the above documentation for accuracy and completeness, and I agree with the above.  Brendolyn Patty MD

## 2019-11-09 NOTE — Patient Instructions (Signed)
Cryotherapy Aftercare  . Wash gently with soap and water everyday.   . Apply Vaseline and Band-Aid daily until healed.  Prior to procedure, discussed risks of blister formation, small wound, skin dyspigmentation, or rare scar following cryotherapy.    Topical steroids (such as triamcinolone, fluocinolone, fluocinonide, mometasone, clobetasol, halobetasol, betamethasone, hydrocortisone) can cause thinning and lightening of the skin if they are used for too long in the same area. Your physician has selected the right strength medicine for your problem and area affected on the body. Please use your medication only as directed by your physician to prevent side effects.   

## 2019-11-09 NOTE — Telephone Encounter (Signed)
[name] with [facility] is calling and would like order for the pt to have a flu shot no cover sheet is needed. Please fax to [phone]

## 2019-11-09 NOTE — Telephone Encounter (Signed)
Verbal OK to give.

## 2019-11-11 ENCOUNTER — Telehealth: Payer: Self-pay

## 2019-11-11 NOTE — Telephone Encounter (Signed)
Copied from [place] [id number]. Topic: General - Other >> Nov 11, 2019  1:31 PM Leonides Schanz, [place] wrote: Reason for CRM: Debbie with [facility] stated they sent a fax on 11/01/19 requesting approval to be able to give pt flu shot but they have not received signed approval. Debbie requests call back. Cb# [id number]

## 2019-11-11 NOTE — Telephone Encounter (Signed)
[name] w/ [facility] was advised that a verbal order form has been faxed and she confirmed while on the phone order was received.

## 2019-11-11 NOTE — Telephone Encounter (Signed)
A verbal order form was faxed to [facility].

## 2019-11-19 ENCOUNTER — Other Ambulatory Visit: Payer: Self-pay | Admitting: Family Medicine

## 2019-11-19 ENCOUNTER — Other Ambulatory Visit: Payer: Self-pay | Admitting: Internal Medicine

## 2019-11-19 DIAGNOSIS — E119 Type 2 diabetes mellitus without complications: Secondary | ICD-10-CM
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
## 2019-11-28 ENCOUNTER — Telehealth: Payer: Self-pay

## 2019-11-28 NOTE — Telephone Encounter (Signed)
Copied from [place] [id number]. Topic: Quick Communication - Rx Refill/Question >> Nov 28, 2019  1:07 PM Erick Blinks wrote: Reason for CRM: Pt has a possible bladder infection since this weekend. Frequent urination, pt urinated on the floor. Declined appt, pt wants a call back/Rx to [facility]. She is [facility] in [facility] contact: [phone]

## 2019-11-29 NOTE — Telephone Encounter (Signed)
Pt states she lives at [facility] now and it is difficult to get away.  She has no way there, and declined virtual.  She states she will try to wait for appt Mon she already has with Simona Huh.

## 2019-11-29 NOTE — Telephone Encounter (Signed)
Can schedule appointment for Thursday or Friday this week if needed.

## 2019-11-29 NOTE — Telephone Encounter (Signed)
Patient will need appointment  [place], [place] Triage Nurse may give patient results

## 2019-12-05 ENCOUNTER — Other Ambulatory Visit: Payer: Self-pay

## 2019-12-05 ENCOUNTER — Encounter: Payer: Self-pay | Admitting: Family Medicine

## 2019-12-05 ENCOUNTER — Other Ambulatory Visit: Payer: Self-pay | Admitting: Family Medicine

## 2019-12-05 ENCOUNTER — Ambulatory Visit (INDEPENDENT_AMBULATORY_CARE_PROVIDER_SITE_OTHER): Payer: Medicare Other | Admitting: Family Medicine

## 2019-12-05 VITALS — BP 131/54 | HR 77 | Temp 98.1°F | Wt 153.0 lb

## 2019-12-05 DIAGNOSIS — R350 Frequency of micturition: Secondary | ICD-10-CM | POA: Diagnosis not present

## 2019-12-05 DIAGNOSIS — E119 Type 2 diabetes mellitus without complications: Secondary | ICD-10-CM | POA: Diagnosis not present

## 2019-12-05 DIAGNOSIS — R609 Edema, unspecified: Secondary | ICD-10-CM | POA: Diagnosis not present

## 2019-12-05 LAB — POCT URINALYSIS DIPSTICK
Bilirubin, UA: NEGATIVE
Blood, UA: NEGATIVE
Glucose, UA: NEGATIVE
Ketones, UA: NEGATIVE
Nitrite, UA: NEGATIVE
Protein, UA: NEGATIVE
Spec Grav, UA: 1.025 (ref 1.010–1.025)
Urobilinogen, UA: 0.2 E.U./dL
pH, UA: 6.5 (ref 5.0–8.0)

## 2019-12-05 MED ORDER — SULFAMETHOXAZOLE-TRIMETHOPRIM 800-160 MG PO TABS: 1.0000 | ORAL_TABLET | Freq: Two times a day (BID) | ORAL | 0 refills | Status: DC

## 2019-12-05 MED ORDER — GLIPIZIDE ER 5 MG PO TB24: ORAL_TABLET | ORAL | 3 refills | Status: DC

## 2019-12-05 MED ORDER — FUROSEMIDE 20 MG PO TABS: 20.0000 mg | ORAL_TABLET | Freq: Every day | ORAL | 0 refills | Status: DC

## 2019-12-05 NOTE — Progress Notes (Signed)
Acute Office Visit  Subjective:    Patient ID: [name], female    DOB: [birth year], 86 y.o.   MRN: [id number]  Chief Complaint  Patient presents with  . Urinary Tract Infection    HPI Patient is in today for possible UTI.  She states her only symptoms is frequency.  She has had no pain or hematuria.  Patient states she has had symptoms for 1 week.  She has been drinking cranberry juice but notes no difference.  Past Medical History:  Diagnosis Date  . Basal cell carcinoma 03/30/2008   left upper arm/excision  . Basal cell carcinoma 03/31/2008   left upper arm, right lower leg  . Basal cell carcinoma 01/18/2009   left upper arm  . Chronic diarrhea   . Collagenous colitis   . Diabetes mellitus without complication ([place])   . Diverticulitis   . Heart murmur    at birth, none since  . History of echocardiogram    a. 11/2015: echo showing EF of 60-65% with no WMA. Grade 1 DD and mild MR noted.   . Hypertension   . PAF (paroxysmal atrial fibrillation) ([place])    a. initially diagnosed in 08/2016 --> started on Eliquis  . Syncope    a. initially occurring in Fall 2016 b. Hospitalized in 10/2015 and 11/2015 for recurrent episodes.     Past Surgical History:  Procedure Laterality Date  . ABDOMINAL HYSTERECTOMY    . BACK SURGERY  08/08/2008   Lumbar discectomy  . COLONOSCOPY WITH PROPOFOL    . EXCISION/RELEASE BURSA HIP Left 05/03/2019   Procedure: HIP ABDUCTOR REPAIR;  Surgeon: Hessie Knows, MD;  Location: [facility] ORS;  Service: Orthopedics;  Laterality: Left;  . INTRAMEDULLARY (IM) NAIL INTERTROCHANTERIC Right 06/13/2019   Procedure: INTRAMEDULLARY (IM) NAIL INTERTROCHANTRIC;  Surgeon: Hessie Knows, MD;  Location: [facility] ORS;  Service: Orthopedics;  Laterality: Right;  . NECK SURGERY  1980    Family History  Problem Relation Age of Onset  . Hypertension Mother   . Diabetes Mother   . Lung cancer Father     Social History   Socioeconomic History  . Marital status:  Widowed    Spouse name: Not on file  . Number of children: 2  . Years of education: Not on file  . Highest education level: Associate degree: academic program  Occupational History  . Occupation: retired  Tobacco Use  . Smoking status: Former Smoker    Quit date: 03/09/1978    Years since quitting: 41.7  . Smokeless tobacco: Never Used  Vaping Use  . Vaping Use: Never used  Substance and Sexual Activity  . Alcohol use: Yes    Alcohol/week: 0.0 - 1.0 standard drinks  . Drug use: No  . Sexual activity: Not on file  Other Topics Concern  . Not on file  Social History Narrative  . Not on file   Social Determinants of Health   Financial Resource Strain:   . Difficulty of Paying Living Expenses: Not on file  Food Insecurity:   . Worried About Charity fundraiser in the Last Year: Not on file  . Ran Out of Food in the Last Year: Not on file  Transportation Needs:   . Lack of Transportation (Medical): Not on file  . Lack of Transportation (Non-Medical): Not on file  Physical Activity:   . Days of Exercise per Week: Not on file  . Minutes of Exercise per Session: Not on file  Stress:   .  Feeling of Stress : Not on file  Social Connections:   . Frequency of Communication with Friends and Family: Not on file  . Frequency of Social Gatherings with Friends and Family: Not on file  . Attends Religious Services: Not on file  . Active Member of Clubs or Organizations: Not on file  . Attends Archivist Meetings: Not on file  . Marital Status: Not on file  Intimate Partner Violence:   . Fear of Current or Ex-Partner: Not on file  . Emotionally Abused: Not on file  . Physically Abused: Not on file  . Sexually Abused: Not on file    Outpatient Medications Prior to Visit  Medication Sig Dispense Refill  . acetaminophen (TYLENOL) 500 MG tablet Take 1,000 mg by mouth 2 (two) times daily as needed (pain.).    Marland Kitchen apixaban (ELIQUIS) 5 MG TABS tablet Take 1 tablet (5 mg total)  by mouth 2 (two) times daily. 180 tablet 2  . Cholecalciferol (VITAMIN D) 2000 UNITS CAPS Take 2,000 Units by mouth daily.     . Clobetasol Propionate 0.05 % shampoo 2- 3 times per week lather on scalp, leave on 20 minutes, wash out. 118 mL 3  . diphenhydramine-acetaminophen (TYLENOL PM) 25-500 MG TABS tablet Take 1 tablet by mouth at bedtime as needed (sleep.).     Marland Kitchen Fluocinolone Acetonide Scalp 0.01 % OIL Apply to scalp in evening, cover with shower cap, wash off in morning. Use on evenings prior to shampooing next day. 118 mL 2  . furosemide (LASIX) 20 MG tablet TAKE ONE TABLET BY MOUTH DAILY AS NEEDED (Patient taking differently: Take 20 mg by mouth daily as needed (fluid retention.). ) 90 tablet 0  . glipiZIDE (GLUCOTROL XL) 5 MG 24 hr tablet TALE 1 TABLET BY MOUTH ONCE DAILY WITH BREAKFAST 16 tablet 0  . lisinopril (ZESTRIL) 20 MG tablet Take 1 tablet (20 mg total) by mouth daily. 90 tablet 1  . metFORMIN (GLUCOPHAGE) 500 MG tablet TAKE 1 TABLET BY MOUTH TWICE DAILY WITH MEALS 180 tablet 3  . metoprolol succinate (TOPROL-XL) 25 MG 24 hr tablet TAKE 1 TABLET BY MOUTH ONCE DAILY 90 tablet 3  . Multiple Vitamin (MULTIVITAMIN WITH MINERALS) TABS tablet Take 1 tablet by mouth daily.    . simvastatin (ZOCOR) 20 MG tablet TAKE ONE TABLET BY MOUTH EVERY NIGHT AT BEDTIME (Patient taking differently: Take 20 mg by mouth at bedtime. ) 90 tablet 2   No facility-administered medications prior to visit.    Allergies  Allergen Reactions  . Morphine Sulfate Other (See Comments)    BP bottoms out  . Penicillins Diarrhea    Has patient had a PCN reaction causing immediate rash, facial/tongue/throat swelling, SOB or lightheadedness with hypotension: Yes Has patient had a PCN reaction causing severe rash involving mucus membranes or skin necrosis: Yes Has patient had a PCN reaction that required hospitalization No Has patient had a PCN reaction occurring within the last 10 years: No If all of the above  answers are "NO", then may proceed with Cephalosporin use.    Review of Systems  Genitourinary: Positive for decreased urine volume, frequency and urgency. Negative for difficulty urinating and hematuria.       Feels she is retaining urine as well.        Objective:    Physical Exam Constitutional:      General: She is not in acute distress.    Appearance: She is well-developed.  HENT:  Head: Normocephalic and atraumatic.     Right Ear: Hearing normal.     Left Ear: Hearing normal.     Nose: Nose normal.  Eyes:     General: Lids are normal. No scleral icterus.       Right eye: No discharge.        Left eye: No discharge.     Conjunctiva/sclera: Conjunctivae normal.  Cardiovascular:     Rate and Rhythm: Normal rate.     Heart sounds: Normal heart sounds.  Pulmonary:     Effort: Pulmonary effort is normal. No respiratory distress.     Breath sounds: Normal breath sounds.  Abdominal:     General: Bowel sounds are normal.     Palpations: Abdomen is soft.     Tenderness: There is abdominal tenderness.     Comments: Slight suprapubic tenderness to palpation. No CVA tenderness posteriorly to percussion.  Musculoskeletal:        General: Normal range of motion.     Cervical back: Neck supple.  Skin:    Findings: No lesion or rash.  Neurological:     Mental Status: She is alert and oriented to person, place, and time.  Psychiatric:        Speech: Speech normal.        Behavior: Behavior normal.        Thought Content: Thought content normal.     BP (!) 131/54 (BP Location: Right Arm, Patient Position: Sitting, Cuff Size: Normal)   Pulse 77   Temp 98.1 F (36.7 C) (Oral)   Wt 153 lb (69.4 kg)   SpO2 98%   BMI 27.10 kg/m  Wt Readings from Last 3 Encounters:  12/05/19 153 lb (69.4 kg)  10/12/19 152 lb 12.8 oz (69.3 kg)  06/11/19 158 lb (71.7 kg)    Health Maintenance Due  Topic Date Due  . COVID-19 Vaccine (1) Never done  . OPHTHALMOLOGY EXAM  12/08/2018  .  INFLUENZA VACCINE  10/09/2019    There are no preventive care reminders to display for this patient.   Lab Results  Component Value Date   TSH 3.140 12/21/2018   Lab Results  Component Value Date   WBC 10.9 (H) 06/15/2019   HGB 10.6 (L) 06/15/2019   HCT 32.3 (L) 06/15/2019   MCV 92.0 06/15/2019   PLT 222 06/15/2019   Lab Results  Component Value Date   NA 139 10/26/2019   K 3.9 10/26/2019   CO2 26 10/26/2019   GLUCOSE 174 (H) 10/26/2019   BUN 27 (H) 10/26/2019   CREATININE 0.88 10/26/2019   BILITOT 0.5 12/21/2018   ALKPHOS 65 12/21/2018   AST 20 12/21/2018   ALT 30 12/21/2018   PROT 6.3 12/21/2018   ALBUMIN 4.1 12/21/2018   CALCIUM 9.8 10/26/2019   ANIONGAP 13 10/26/2019   Lab Results  Component Value Date   CHOL 149 12/21/2018   Lab Results  Component Value Date   HDL 52 12/21/2018   Lab Results  Component Value Date   LDLCALC 69 12/21/2018   Lab Results  Component Value Date   TRIG 163 (H) 12/21/2018   Lab Results  Component Value Date   CHOLHDL 2.9 12/21/2018   Lab Results  Component Value Date   HGBA1C 7.0 (H) 06/11/2019       Assessment & Plan:   1. Urinary frequency Having urinary frequency and urgency over the past week. No fever or hematuria. Urinalysis showed 2+ leukocytes. Will treat with Septra DS  and encourage extra fluids without sugar. Will get urine C&S. Recheck pending reports. - POCT urinalysis dipstick  2. Peripheral edema No significant edema without pitting today. No dyspnea. Has intermittent episodes of swelling and requests refill of Lasix for prn use. Recheck labs. - furosemide (LASIX) 20 MG tablet; Take 1 tablet (20 mg total) by mouth daily. (only if needed).  Dispense: 90 tablet; Refill: 0 - CBC with Differential/Platelet - Comprehensive metabolic panel  3. Type 2 diabetes mellitus without complication, without long-term current use of insulin (HCC) Denies hypoglycemic episodes. Encouraged to get eye exam annually.  Continue diet and refill Glipizide 5 mg qd. Recheck labs. - glipiZIDE (GLUCOTROL XL) 5 MG 24 hr tablet; TALE 1 TABLET BY MOUTH ONCE DAILY WITH BREAKFAST  Dispense: 90 tablet; Refill: 3 - CBC with Differential/Platelet - Comprehensive metabolic panel - Hemoglobin A1c   I, Elias Bordner, PA, have reviewed all documentation for this visit. The documentation on 12/13/19 for the exam, diagnosis, procedures, and orders are all accurate and complete.  Juluis Mire, CMA

## 2019-12-06 ENCOUNTER — Telehealth: Payer: Self-pay

## 2019-12-06 LAB — CBC WITH DIFFERENTIAL/PLATELET
Basophils Absolute: 0 10*3/uL (ref 0.0–0.2)
Basos: 0 %
EOS (ABSOLUTE): 0.2 10*3/uL (ref 0.0–0.4)
Eos: 3 %
Hematocrit: 36.4 % (ref 34.0–46.6)
Hemoglobin: 12.2 g/dL (ref 11.1–15.9)
Immature Grans (Abs): 0 10*3/uL (ref 0.0–0.1)
Immature Granulocytes: 1 %
Lymphocytes Absolute: 3 10*3/uL (ref 0.7–3.1)
Lymphs: 38 %
MCH: 29.8 pg (ref 26.6–33.0)
MCHC: 33.5 g/dL (ref 31.5–35.7)
MCV: 89 fL (ref 79–97)
Monocytes Absolute: 0.7 10*3/uL (ref 0.1–0.9)
Monocytes: 9 %
Neutrophils Absolute: 3.8 10*3/uL (ref 1.4–7.0)
Neutrophils: 49 %
Platelets: 273 10*3/uL (ref 150–450)
RBC: 4.09 x10E6/uL (ref 3.77–5.28)
RDW: 11.9 % (ref 11.7–15.4)
WBC: 7.8 10*3/uL (ref 3.4–10.8)

## 2019-12-06 LAB — COMPREHENSIVE METABOLIC PANEL
ALT: 23 IU/L (ref 0–32)
AST: 13 IU/L (ref 0–40)
Albumin/Globulin Ratio: 1.4 (ref 1.2–2.2)
Albumin: 4.1 g/dL (ref 3.6–4.6)
Alkaline Phosphatase: 108 IU/L (ref 44–121)
BUN/Creatinine Ratio: 35 — ABNORMAL HIGH (ref 12–28)
BUN: 28 mg/dL — ABNORMAL HIGH (ref 8–27)
Bilirubin Total: 0.5 mg/dL (ref 0.0–1.2)
CO2: 24 mmol/L (ref 20–29)
Calcium: 9.7 mg/dL (ref 8.7–10.3)
Chloride: 103 mmol/L (ref 96–106)
Creatinine, Ser: 0.8 mg/dL (ref 0.57–1.00)
GFR calc Af Amer: 77 mL/min/{1.73_m2} (ref >59)
GFR calc non Af Amer: 67 mL/min/{1.73_m2} (ref >59)
Globulin, Total: 3 g/dL (ref 1.5–4.5)
Glucose: 178 mg/dL — ABNORMAL HIGH (ref 65–99)
Potassium: 4.5 mmol/L (ref 3.5–5.2)
Sodium: 141 mmol/L (ref 134–144)
Total Protein: 7.1 g/dL (ref 6.0–8.5)

## 2019-12-06 LAB — HEMOGLOBIN A1C
Est. average glucose Bld gHb Est-mCnc: 171 mg/dL
Hgb A1c MFr Bld: 7.6 % — ABNORMAL HIGH (ref 4.8–5.6)

## 2019-12-06 NOTE — Telephone Encounter (Signed)
-----   Message from Margo Common, [place] sent at 12/06/2019  8:15 AM EDT ----- Blood sugar, Hgb A1C and BUN elevated - may be due to UTI and not drinking enough water. Awaiting urine culture report. Recheck diabetes in 3 months.

## 2019-12-06 NOTE — Telephone Encounter (Signed)
LMTCB-If patient calls back ok for [facility] nurse to give results. °

## 2019-12-07 NOTE — Telephone Encounter (Signed)
Patient's daughterNeoma Laming- [place]) returned call- notified of lab results and recommendations. Daughter states is is fine to leave message on VM- she is the only one who has access to it.

## 2019-12-09 LAB — URINE CULTURE

## 2019-12-12 ENCOUNTER — Other Ambulatory Visit: Payer: Self-pay

## 2019-12-12 MED ORDER — NITROFURANTOIN MONOHYD MACRO 100 MG PO CAPS
100.0000 mg | ORAL_CAPSULE | Freq: Two times a day (BID) | ORAL | 0 refills | Status: DC
Start: 2019-12-12 — End: 2019-12-23

## 2019-12-23 ENCOUNTER — Emergency Department: Payer: Medicare Other

## 2019-12-23 ENCOUNTER — Inpatient Hospital Stay
Admission: EM | Admit: 2019-12-23 | Discharge: 2019-12-27 | DRG: 871 | Disposition: A | Discharge status: Nursing Home (Includes ICF) | Payer: Medicare Other | Source: Skilled Nursing Facility | Attending: Hospitalist | Admitting: Hospitalist

## 2019-12-23 ENCOUNTER — Other Ambulatory Visit: Payer: Self-pay

## 2019-12-23 DIAGNOSIS — I152 Hypertension secondary to endocrine disorders: Secondary | ICD-10-CM | POA: Diagnosis present

## 2019-12-23 DIAGNOSIS — D6859 Other primary thrombophilia: Secondary | ICD-10-CM | POA: Diagnosis present

## 2019-12-23 DIAGNOSIS — Z85828 Personal history of other malignant neoplasm of skin: Secondary | ICD-10-CM

## 2019-12-23 DIAGNOSIS — Z880 Allergy status to penicillin: Secondary | ICD-10-CM | POA: Diagnosis not present

## 2019-12-23 DIAGNOSIS — E782 Mixed hyperlipidemia: Secondary | ICD-10-CM | POA: Diagnosis present

## 2019-12-23 DIAGNOSIS — Z66 Do not resuscitate: Secondary | ICD-10-CM | POA: Diagnosis present

## 2019-12-23 DIAGNOSIS — Z90710 Acquired absence of both cervix and uterus: Secondary | ICD-10-CM | POA: Diagnosis not present

## 2019-12-23 DIAGNOSIS — Z801 Family history of malignant neoplasm of trachea, bronchus and lung: Secondary | ICD-10-CM | POA: Diagnosis not present

## 2019-12-23 DIAGNOSIS — Z20822 Contact with and (suspected) exposure to covid-19: Secondary | ICD-10-CM | POA: Diagnosis present

## 2019-12-23 DIAGNOSIS — E860 Dehydration: Secondary | ICD-10-CM | POA: Diagnosis present

## 2019-12-23 DIAGNOSIS — Z8249 Family history of ischemic heart disease and other diseases of the circulatory system: Secondary | ICD-10-CM | POA: Diagnosis not present

## 2019-12-23 DIAGNOSIS — Z7901 Long term (current) use of anticoagulants: Secondary | ICD-10-CM | POA: Diagnosis not present

## 2019-12-23 DIAGNOSIS — L8990 Pressure ulcer of unspecified site, unspecified stage: Secondary | ICD-10-CM | POA: Insufficient documentation

## 2019-12-23 DIAGNOSIS — I10 Essential (primary) hypertension: Secondary | ICD-10-CM | POA: Diagnosis present

## 2019-12-23 DIAGNOSIS — Z7984 Long term (current) use of oral hypoglycemic drugs: Secondary | ICD-10-CM | POA: Diagnosis not present

## 2019-12-23 DIAGNOSIS — E119 Type 2 diabetes mellitus without complications: Secondary | ICD-10-CM | POA: Diagnosis present

## 2019-12-23 DIAGNOSIS — Z79899 Other long term (current) drug therapy: Secondary | ICD-10-CM | POA: Diagnosis not present

## 2019-12-23 DIAGNOSIS — J690 Pneumonitis due to inhalation of food and vomit: Secondary | ICD-10-CM | POA: Diagnosis present

## 2019-12-23 DIAGNOSIS — Z885 Allergy status to narcotic agent status: Secondary | ICD-10-CM | POA: Diagnosis not present

## 2019-12-23 DIAGNOSIS — A419 Sepsis, unspecified organism: Secondary | ICD-10-CM | POA: Diagnosis present

## 2019-12-23 DIAGNOSIS — Z833 Family history of diabetes mellitus: Secondary | ICD-10-CM | POA: Diagnosis not present

## 2019-12-23 DIAGNOSIS — L89151 Pressure ulcer of sacral region, stage 1: Secondary | ICD-10-CM | POA: Diagnosis present

## 2019-12-23 DIAGNOSIS — Z87891 Personal history of nicotine dependence: Secondary | ICD-10-CM

## 2019-12-23 DIAGNOSIS — R6520 Severe sepsis without septic shock: Secondary | ICD-10-CM | POA: Diagnosis not present

## 2019-12-23 DIAGNOSIS — J189 Pneumonia, unspecified organism: Secondary | ICD-10-CM | POA: Diagnosis present

## 2019-12-23 DIAGNOSIS — J9601 Acute respiratory failure with hypoxia: Secondary | ICD-10-CM | POA: Diagnosis present

## 2019-12-23 DIAGNOSIS — J9600 Acute respiratory failure, unspecified whether with hypoxia or hypercapnia: Secondary | ICD-10-CM | POA: Diagnosis present

## 2019-12-23 DIAGNOSIS — Z87440 Personal history of urinary (tract) infections: Secondary | ICD-10-CM | POA: Diagnosis not present

## 2019-12-23 DIAGNOSIS — I480 Paroxysmal atrial fibrillation: Secondary | ICD-10-CM | POA: Diagnosis present

## 2019-12-23 DIAGNOSIS — E1159 Type 2 diabetes mellitus with other circulatory complications: Secondary | ICD-10-CM

## 2019-12-23 LAB — CBC WITH DIFFERENTIAL/PLATELET
Abs Immature Granulocytes: 0.04 K/uL (ref 0.00–0.07)
Basophils Absolute: 0 K/uL (ref 0.0–0.1)
Basophils Relative: 0 %
Eosinophils Absolute: 0.5 K/uL (ref 0.0–0.5)
Eosinophils Relative: 4 %
HCT: 35.2 % — ABNORMAL LOW (ref 36.0–46.0)
Hemoglobin: 12 g/dL (ref 12.0–15.0)
Immature Granulocytes: 0 %
Lymphocytes Relative: 11 %
Lymphs Abs: 1.4 K/uL (ref 0.7–4.0)
MCH: 30.2 pg (ref 26.0–34.0)
MCHC: 34.1 g/dL (ref 30.0–36.0)
MCV: 88.4 fL (ref 80.0–100.0)
Monocytes Absolute: 1.1 K/uL — ABNORMAL HIGH (ref 0.1–1.0)
Monocytes Relative: 8 %
Neutro Abs: 9.9 K/uL — ABNORMAL HIGH (ref 1.7–7.7)
Neutrophils Relative %: 77 %
Platelets: 214 K/uL (ref 150–400)
RBC: 3.98 MIL/uL (ref 3.87–5.11)
RDW: 12.5 % (ref 11.5–15.5)
WBC: 12.9 K/uL — ABNORMAL HIGH (ref 4.0–10.5)
nRBC: 0 % (ref 0.0–0.2)

## 2019-12-23 LAB — URINALYSIS, COMPLETE (UACMP) WITH MICROSCOPIC
Bilirubin Urine: NEGATIVE
Glucose, UA: NEGATIVE mg/dL
Hgb urine dipstick: NEGATIVE
Ketones, ur: 20 mg/dL — AB
Leukocytes,Ua: NEGATIVE
Nitrite: NEGATIVE
Protein, ur: 30 mg/dL — AB
Specific Gravity, Urine: 1.009 (ref 1.005–1.030)
Squamous Epithelial / HPF: NONE SEEN (ref 0–5)
pH: 5 (ref 5.0–8.0)

## 2019-12-23 LAB — COMPREHENSIVE METABOLIC PANEL WITH GFR
ALT: 71 U/L — ABNORMAL HIGH (ref 0–44)
AST: 32 U/L (ref 15–41)
Albumin: 3.2 g/dL — ABNORMAL LOW (ref 3.5–5.0)
Alkaline Phosphatase: 133 U/L — ABNORMAL HIGH (ref 38–126)
Anion gap: 13 (ref 5–15)
BUN: 45 mg/dL — ABNORMAL HIGH (ref 8–23)
CO2: 21 mmol/L — ABNORMAL LOW (ref 22–32)
Calcium: 8.8 mg/dL — ABNORMAL LOW (ref 8.9–10.3)
Chloride: 101 mmol/L (ref 98–111)
Creatinine, Ser: 1.08 mg/dL — ABNORMAL HIGH (ref 0.44–1.00)
GFR, Estimated: 46 mL/min — ABNORMAL LOW (ref >60)
Glucose, Bld: 277 mg/dL — ABNORMAL HIGH (ref 70–99)
Potassium: 3.7 mmol/L (ref 3.5–5.1)
Sodium: 135 mmol/L (ref 135–145)
Total Bilirubin: 1.4 mg/dL — ABNORMAL HIGH (ref 0.3–1.2)
Total Protein: 6.6 g/dL (ref 6.5–8.1)

## 2019-12-23 LAB — GLUCOSE, CAPILLARY
Glucose-Capillary: 253 mg/dL — ABNORMAL HIGH (ref 70–99)
Glucose-Capillary: 247 mg/dL — ABNORMAL HIGH (ref 70–99)
Glucose-Capillary: 197 mg/dL — ABNORMAL HIGH (ref 70–99)

## 2019-12-23 LAB — LACTIC ACID, PLASMA: Lactic Acid, Venous: 1.5 mmol/L (ref 0.5–1.9)

## 2019-12-23 LAB — RESPIRATORY PANEL BY RT PCR (FLU A&B, COVID)
Influenza A by PCR: NEGATIVE
Influenza B by PCR: NEGATIVE
SARS Coronavirus 2 by RT PCR: NEGATIVE

## 2019-12-23 LAB — APTT: aPTT: 36 seconds (ref 24–36)

## 2019-12-23 LAB — PROTIME-INR
INR: 2 — ABNORMAL HIGH (ref 0.8–1.2)
Prothrombin Time: 21.7 s — ABNORMAL HIGH (ref 11.4–15.2)

## 2019-12-23 MED ORDER — SODIUM CHLORIDE 0.9 % IV SOLN
3.0000 g | Freq: Three times a day (TID) | INTRAVENOUS | Status: DC
  Administered 2019-12-23 – 2019-12-24 (×3): 3 g via INTRAVENOUS
  Filled 2019-12-23 (×3): qty 8
  Filled 2019-12-23: qty 3
  Filled 2019-12-23 (×2): qty 8

## 2019-12-23 MED ORDER — IOHEXOL 300 MG/ML  SOLN
80.0000 mL | Freq: Once | INTRAMUSCULAR | Status: AC | PRN
  Administered 2019-12-23: 80 mL via INTRAVENOUS

## 2019-12-23 MED ORDER — SODIUM CHLORIDE 0.9 % IV SOLN
2.0000 g | Freq: Once | INTRAVENOUS | Status: AC
  Administered 2019-12-23: 2 g via INTRAVENOUS
  Filled 2019-12-23: qty 2

## 2019-12-23 MED ORDER — ONDANSETRON HCL 4 MG/2ML IJ SOLN: 4.0000 mg | Freq: Four times a day (QID) | INTRAMUSCULAR | Status: DC | PRN

## 2019-12-23 MED ORDER — SODIUM CHLORIDE 0.9 % IV SOLN: INTRAVENOUS | Status: DC

## 2019-12-23 MED ORDER — SODIUM CHLORIDE 0.9 % IV SOLN
500.0000 mg | Freq: Once | INTRAVENOUS | Status: AC
  Administered 2019-12-23: 500 mg via INTRAVENOUS
  Filled 2019-12-23: qty 500

## 2019-12-23 MED ORDER — ADULT MULTIVITAMIN W/MINERALS CH
1.0000 | ORAL_TABLET | Freq: Every day | ORAL | Status: DC
  Administered 2019-12-23 – 2019-12-27 (×5): 1 via ORAL
  Filled 2019-12-23 (×4): qty 1

## 2019-12-23 MED ORDER — SODIUM CHLORIDE 0.9 % IV BOLUS
1000.0000 mL | Freq: Once | INTRAVENOUS | Status: AC
  Administered 2019-12-23: 1000 mL via INTRAVENOUS

## 2019-12-23 MED ORDER — SODIUM CHLORIDE 0.9 % IV SOLN: 2.0000 g | INTRAVENOUS | Status: DC

## 2019-12-23 MED ORDER — ONDANSETRON HCL 4 MG PO TABS: 4.0000 mg | ORAL_TABLET | Freq: Four times a day (QID) | ORAL | Status: DC | PRN

## 2019-12-23 MED ORDER — INSULIN ASPART 100 UNIT/ML ~~LOC~~ SOLN: 0.0000 [IU] | Freq: Three times a day (TID) | SUBCUTANEOUS | Status: DC

## 2019-12-23 MED ORDER — LACTATED RINGERS IV SOLN: INTRAVENOUS | Status: AC

## 2019-12-23 MED ORDER — METOPROLOL SUCCINATE ER 50 MG PO TB24: 25.0000 mg | ORAL_TABLET | Freq: Every day | ORAL | Status: DC

## 2019-12-23 MED ORDER — LISINOPRIL 10 MG PO TABS: 20.0000 mg | ORAL_TABLET | Freq: Every day | ORAL | Status: DC

## 2019-12-23 MED ORDER — SIMVASTATIN 20 MG PO TABS
20.0000 mg | ORAL_TABLET | Freq: Every day | ORAL | Status: DC
  Administered 2019-12-23 – 2019-12-26 (×4): 20 mg via ORAL
  Filled 2019-12-23 (×4): qty 1

## 2019-12-23 MED ORDER — ACETAMINOPHEN 500 MG PO TABS
1000.0000 mg | ORAL_TABLET | Freq: Two times a day (BID) | ORAL | Status: DC
  Administered 2019-12-23 – 2019-12-27 (×8): 1000 mg via ORAL
  Filled 2019-12-23 (×8): qty 2

## 2019-12-23 MED ORDER — SODIUM CHLORIDE 0.9 % IV BOLUS
500.0000 mL | Freq: Once | INTRAVENOUS | Status: AC
  Administered 2019-12-23: 500 mL via INTRAVENOUS

## 2019-12-23 MED ORDER — VANCOMYCIN HCL IN DEXTROSE 1-5 GM/200ML-% IV SOLN
1000.0000 mg | Freq: Once | INTRAVENOUS | Status: AC
  Administered 2019-12-23: 1000 mg via INTRAVENOUS
  Filled 2019-12-23: qty 200

## 2019-12-23 MED ORDER — ACETAMINOPHEN 500 MG PO TABS
1000.0000 mg | ORAL_TABLET | Freq: Once | ORAL | Status: AC
  Administered 2019-12-23: 1000 mg via ORAL
  Filled 2019-12-23: qty 2

## 2019-12-23 MED ORDER — INSULIN ASPART 100 UNIT/ML ~~LOC~~ SOLN
0.0000 [IU] | Freq: Three times a day (TID) | SUBCUTANEOUS | Status: DC
  Administered 2019-12-24: 2 [IU] via SUBCUTANEOUS
  Administered 2019-12-24 (×2): 5 [IU] via SUBCUTANEOUS
  Administered 2019-12-25: 3 [IU] via SUBCUTANEOUS
  Administered 2019-12-25: 5 [IU] via SUBCUTANEOUS
  Administered 2019-12-25: 3 [IU] via SUBCUTANEOUS
  Administered 2019-12-26: 5 [IU] via SUBCUTANEOUS
  Administered 2019-12-26: 3 [IU] via SUBCUTANEOUS
  Administered 2019-12-26: 2 [IU] via SUBCUTANEOUS
  Administered 2019-12-27: 3 [IU] via SUBCUTANEOUS
  Administered 2019-12-27: 5 [IU] via SUBCUTANEOUS
  Filled 2019-12-23 (×11): qty 1

## 2019-12-23 MED ORDER — SODIUM CHLORIDE 0.9 % IV SOLN: 500.0000 mg | INTRAVENOUS | Status: DC

## 2019-12-23 MED ORDER — INSULIN ASPART 100 UNIT/ML ~~LOC~~ SOLN: 0.0000 [IU] | SUBCUTANEOUS | Status: DC

## 2019-12-23 MED ORDER — TRAZODONE HCL 50 MG PO TABS
50.0000 mg | ORAL_TABLET | Freq: Every evening | ORAL | Status: DC | PRN
  Administered 2019-12-23 – 2019-12-26 (×4): 50 mg via ORAL
  Filled 2019-12-23 (×5): qty 1

## 2019-12-23 MED ORDER — INSULIN ASPART 100 UNIT/ML ~~LOC~~ SOLN
6.0000 [IU] | Freq: Once | SUBCUTANEOUS | Status: AC
  Administered 2019-12-23: 6 [IU] via INTRAVENOUS
  Filled 2019-12-23: qty 1

## 2019-12-23 MED ORDER — APIXABAN 5 MG PO TABS
5.0000 mg | ORAL_TABLET | Freq: Two times a day (BID) | ORAL | Status: DC
  Administered 2019-12-23 – 2019-12-27 (×9): 5 mg via ORAL
  Filled 2019-12-23 (×8): qty 1

## 2019-12-23 MED ORDER — FUROSEMIDE 40 MG PO TABS: 20.0000 mg | ORAL_TABLET | Freq: Every day | ORAL | Status: DC

## 2019-12-23 MED ORDER — VITAMIN D3 25 MCG (1000 UNIT) PO TABS
2000.0000 [IU] | ORAL_TABLET | Freq: Every day | ORAL | Status: DC
  Administered 2019-12-23 – 2019-12-27 (×5): 2000 [IU] via ORAL
  Filled 2019-12-23 (×8): qty 2

## 2019-12-23 NOTE — ED Triage Notes (Signed)
BIBA from [facility] assisted living for weakness and feeling unwell x several days. Found to be febrile >102, hypoxic to 88% on RA. Arrives awake alert in NAD. Rec'd 4mg  zofran PTA.

## 2019-12-23 NOTE — Progress Notes (Signed)
CODE SEPSIS - PHARMACY COMMUNICATION  **Broad Spectrum Antibiotics should be administered within 1 hour of Sepsis diagnosis**  Time Code Sepsis Called/Page Received: 4129  Antibiotics Ordered: vancomycin/cefepime/azithromycin  Time of 1st antibiotic administration: 0819  Additional action taken by pharmacy: NA  If necessary, Name of Provider/Nurse Contacted: NA    Tawnya Crook ,PharmD Clinical Pharmacist  12/23/2019  8:55 AM

## 2019-12-23 NOTE — ED Notes (Signed)
Daughter and mother advise that they would like to consider leaving AMA and decline bed assignment due to family trip to [facility] tomorrow. MD notified. He will follow up with patient to discuss.

## 2019-12-23 NOTE — ED Notes (Signed)
Called to provide report. Advised that if RN had any questions she could call back. Transport request put in.

## 2019-12-23 NOTE — ED Notes (Signed)
Report given to RN on 2A. Transport request was submitted.

## 2019-12-23 NOTE — ED Provider Notes (Signed)
[facility] Emergency Department Provider Note    First MD Initiated Contact with Patient 12/23/19 [phone]     (approximate)  I have reviewed the triage vital signs and the nursing notes.   HISTORY  Chief Complaint Fever    HPI [name] is a 86 y.o. female below this past medical history presents to the ER from nursing facility due to generalized weakness fatigue fever and hypoxia.  Poorly patient was started on a course of antibiotics 2 weeks ago for UTI.  They reportedly had to change the antibiotic she just completed her last dose yesterday.  She denies any pain.  No flank pain.  No back pain.  Not having cough or congestion.  Does not wear home oxygen.  She feels very thirsty.    Past Medical History:  Diagnosis Date  . Basal cell carcinoma 03/30/2008   left upper arm/excision  . Basal cell carcinoma 03/31/2008   left upper arm, right lower leg  . Basal cell carcinoma 01/18/2009   left upper arm  . Chronic diarrhea   . Collagenous colitis   . Diabetes mellitus without complication ([place])   . Diverticulitis   . Heart murmur    at birth, none since  . History of echocardiogram    a. 11/2015: echo showing EF of 60-65% with no WMA. Grade 1 DD and mild MR noted.   . Hypertension   . PAF (paroxysmal atrial fibrillation) ([place])    a. initially diagnosed in 08/2016 --> started on Eliquis  . Syncope    a. initially occurring in Fall 2016 b. Hospitalized in 10/2015 and 11/2015 for recurrent episodes.    Family History  Problem Relation Age of Onset  . Hypertension Mother   . Diabetes Mother   . Lung cancer Father    Past Surgical History:  Procedure Laterality Date  . ABDOMINAL HYSTERECTOMY    . BACK SURGERY  08/08/2008   Lumbar discectomy  . COLONOSCOPY WITH PROPOFOL    . EXCISION/RELEASE BURSA HIP Left 05/03/2019   Procedure: HIP ABDUCTOR REPAIR;  Surgeon: Hessie Knows, MD;  Location: [facility] ORS;  Service: Orthopedics;  Laterality: Left;   . INTRAMEDULLARY (IM) NAIL INTERTROCHANTERIC Right 06/13/2019   Procedure: INTRAMEDULLARY (IM) NAIL INTERTROCHANTRIC;  Surgeon: Hessie Knows, MD;  Location: [facility] ORS;  Service: Orthopedics;  Laterality: Right;  . NECK SURGERY  1980   Patient Active Problem List   Diagnosis Date Noted  . Sepsis ([place]) 12/23/2019  . Abnormal echocardiogram 10/12/2019  . Elevated troponin 10/12/2019  . Chest pain of uncertain etiology   . Closed fracture of right hip ([place])   . Fall   . Intertrochanteric fracture of right femur ([place]) 06/11/2019  . Femur fracture, right ([place]) 06/11/2019  . Hip pain 12/23/2018  . Right ankle pain 12/23/2018  . Mixed hyperlipidemia 04/29/2017  . Lower extremity edema 12/24/2016  . Fatigue 12/24/2016  . Current use of long term anticoagulation 09/17/2016  . Paroxysmal atrial fibrillation ([place]) 08/13/2016  . Seizure ([place])   . Syncope and collapse   . Syncope 11/21/2015  . ARF (acute renal failure) ([place]) 10/26/2015  . Laceration of right lower leg 09/28/2015  . Bursitis of hip 08/27/2015  . Senile purpura ([place]) 01/01/2015  . Edema 08/15/2014  . Arthritis 07/12/2014  . Basal cell carcinoma 07/12/2014  . CC (collagenous colitis) 07/12/2014  . Essential hypertension 07/12/2014  . Hypercholesteremia 07/12/2014  . Adiposity 07/12/2014  . Acne erythematosa 07/12/2014  . Diabetes mellitus, type 2 (  [place]) 07/12/2014  . 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 Avitaminosis D 07/12/2014  . H/O malignant neoplasm of skin 10/10/2011      Prior to Admission medications   Medication Sig Start Date End Date Taking? Authorizing Provider  acetaminophen (TYLENOL) 500 MG tablet Take 1,000 mg by mouth in the morning and at bedtime.    Yes [provider]  apixaban (ELIQUIS) 5 MG TABS tablet Take 1 tablet (5 mg total) by mouth 2 (two) times daily. 04/13/19  Yes End, Harrell Gave, MD  Cholecalciferol (VITAMIN D) 2000 UNITS CAPS Take 2,000 Units by mouth daily.    Yes [provider]   Clobetasol Propionate 0.05 % shampoo 2- 3 times per week lather on scalp, leave on 20 minutes, wash out. 11/09/19  Yes Brendolyn Patty, MD  Fluocinolone Acetonide Scalp 0.01 % OIL Apply to scalp in evening, cover with shower cap, wash off in morning. Use on evenings prior to shampooing next day. 11/09/19  Yes Brendolyn Patty, MD  furosemide (LASIX) 20 MG tablet Take 1 tablet (20 mg total) by mouth daily. (only if needed). 12/05/19  Yes Chrismon, Vickki Muff, PA  glipiZIDE (GLUCOTROL XL) 5 MG 24 hr tablet TALE 1 TABLET BY MOUTH ONCE DAILY WITH BREAKFAST 12/05/19  Yes Chrismon, Vickki Muff, PA  lisinopril (ZESTRIL) 20 MG tablet Take 1 tablet (20 mg total) by mouth daily. 10/12/19 01/10/20 Yes End, Harrell Gave, MD  metFORMIN (GLUCOPHAGE) 500 MG tablet TAKE 1 TABLET BY MOUTH TWICE DAILY WITH MEALS 09/23/19  Yes Jerrol Banana., MD  metoprolol succinate (TOPROL-XL) 25 MG 24 hr tablet TAKE 1 TABLET BY MOUTH ONCE DAILY 11/21/19  Yes End, Harrell Gave, MD  Multiple Vitamin (MULTIVITAMIN WITH MINERALS) TABS tablet Take 1 tablet by mouth daily.   Yes [provider]  simvastatin (ZOCOR) 20 MG tablet TAKE ONE TABLET BY MOUTH EVERY NIGHT AT BEDTIME Patient taking differently: Take 20 mg by mouth at bedtime.  04/05/19  Yes Chrismon, Vickki Muff, PA    Allergies Morphine sulfate and Penicillins    Social History Social History   Tobacco Use  . Smoking status: Former Smoker    Quit date: 03/09/1978    Years since quitting: 41.8  . Smokeless tobacco: Never Used  Vaping Use  . Vaping Use: Never used  Substance Use Topics  . Alcohol use: Yes    Alcohol/week: 0.0 - 1.0 standard drinks  . Drug use: No    Review of Systems Patient denies headaches, rhinorrhea, blurry vision, numbness, shortness of breath, chest pain, edema, cough, abdominal pain, nausea, vomiting, diarrhea, dysuria, fevers, rashes or hallucinations unless otherwise stated above in HPI. ____________________________________________   PHYSICAL  EXAM:  VITAL SIGNS: Vitals:   12/23/19 1034 12/23/19 1100  BP: (!) 97/54 (!) 94/50  Pulse: (!) 112 (!) 104  Resp: (!) 26 18  Temp:    SpO2: 96% 94%    Constitutional: Alert and oriented. Ill appearing protecting airway Eyes: Conjunctivae are normal.  Head: Atraumatic. Nose: No congestion/rhinnorhea. Mouth/Throat: Mucous membranes are moist.   Neck: No stridor. Painless ROM.  Cardiovascular: Normal rate, regular rhythm. Grossly normal heart sounds.  Good peripheral circulation. Respiratory: Normal respiratory effort.  No retractions. Lungs CTAB. Gastrointestinal: Soft and nontender in all four quadrants. No distention. No abdominal bruits. No CVA tenderness. Genitourinary:  Musculoskeletal: No lower extremity tenderness nor edema.  No joint effusions. Neurologic:  Normal speech and language. No gross focal neurologic deficits are appreciated. No facial droop Skin:  Skin is warm, dry and intact.  No rash noted. Psychiatric: Mood and affect are normal. Speech and behavior are normal.  ____________________________________________   LABS (all labs ordered are listed, but only abnormal results are displayed)  Results for orders placed or performed during the hospital encounter of 12/23/19 (from the past 24 hour(s))  Lactic acid, plasma     Status: None   Collection Time: 12/23/19  7:40 AM  Result Value Ref Range   Lactic Acid, Venous 1.5 0.5 - 1.9 mmol/L  Comprehensive metabolic panel     Status: Abnormal   Collection Time: 12/23/19  7:40 AM  Result Value Ref Range   Sodium 135 135 - 145 mmol/L   Potassium 3.7 3.5 - 5.1 mmol/L   Chloride 101 98 - 111 mmol/L   CO2 21 (L) 22 - 32 mmol/L   Glucose, Bld 277 (H) 70 - 99 mg/dL   BUN 45 (H) 8 - 23 mg/dL   Creatinine, Ser 1.08 (H) 0.44 - 1.00 mg/dL   Calcium 8.8 (L) 8.9 - 10.3 mg/dL   Total Protein 6.6 6.5 - 8.1 g/dL   Albumin 3.2 (L) 3.5 - 5.0 g/dL   AST 32 15 - 41 U/L   ALT 71 (H) 0 - 44 U/L   Alkaline Phosphatase 133 (H) 38 -  126 U/L   Total Bilirubin 1.4 (H) 0.3 - 1.2 mg/dL   GFR, Estimated 46 (L) >60 mL/min   Anion gap 13 5 - 15  CBC WITH DIFFERENTIAL     Status: Abnormal   Collection Time: 12/23/19  7:40 AM  Result Value Ref Range   WBC 12.9 (H) 4.0 - 10.5 K/uL   RBC 3.98 3.87 - 5.11 MIL/uL   Hemoglobin 12.0 12.0 - 15.0 g/dL   HCT 35.2 (L) 36 - 46 %   MCV 88.4 80.0 - 100.0 fL   MCH 30.2 26.0 - 34.0 pg   MCHC 34.1 30.0 - 36.0 g/dL   RDW 12.5 11.5 - 15.5 %   Platelets 214 150 - 400 K/uL   nRBC 0.0 0.0 - 0.2 %   Neutrophils Relative % 77 %   Neutro Abs 9.9 (H) 1.7 - 7.7 K/uL   Lymphocytes Relative 11 %   Lymphs Abs 1.4 0.7 - 4.0 K/uL   Monocytes Relative 8 %   Monocytes Absolute 1.1 (H) 0.1 - 1.0 K/uL   Eosinophils Relative 4 %   Eosinophils Absolute 0.5 0.0 - 0.5 K/uL   Basophils Relative 0 %   Basophils Absolute 0.0 0.0 - 0.1 K/uL   Immature Granulocytes 0 %   Abs Immature Granulocytes 0.04 0.00 - 0.07 K/uL  Urinalysis, Complete w Microscopic Urine, Catheterized     Status: Abnormal   Collection Time: 12/23/19  7:40 AM  Result Value Ref Range   Color, Urine YELLOW (A) YELLOW   APPearance HAZY (A) CLEAR   Specific Gravity, Urine 1.009 1.005 - 1.030   pH 5.0 5.0 - 8.0   Glucose, UA NEGATIVE NEGATIVE mg/dL   Hgb urine dipstick NEGATIVE NEGATIVE   Bilirubin Urine NEGATIVE NEGATIVE   Ketones, ur 20 (A) NEGATIVE mg/dL   Protein, ur 30 (A) NEGATIVE mg/dL   Nitrite NEGATIVE NEGATIVE   Leukocytes,Ua NEGATIVE NEGATIVE   RBC / HPF 0-5 0 - 5 RBC/hpf   WBC, UA 0-5 0 - 5 WBC/hpf   Bacteria, UA RARE (A) NONE SEEN   Squamous Epithelial / LPF NONE SEEN 0 - 5   Mucus PRESENT   Respiratory Panel by RT PCR (Flu A&B, Covid) -  Nasopharyngeal Swab     Status: None   Collection Time: 12/23/19  7:40 AM   Specimen: Nasopharyngeal Swab  Result Value Ref Range   SARS Coronavirus 2 by RT PCR NEGATIVE NEGATIVE   Influenza A by PCR NEGATIVE NEGATIVE   Influenza B by PCR NEGATIVE NEGATIVE  Protime-INR     Status:  Abnormal   Collection Time: 12/23/19  8:15 AM  Result Value Ref Range   Prothrombin Time 21.7 (H) 11.4 - 15.2 seconds   INR 2.0 (H) 0.8 - 1.2  APTT     Status: None   Collection Time: 12/23/19  8:15 AM  Result Value Ref Range   aPTT 36 24 - 36 seconds  Glucose, capillary     Status: Abnormal   Collection Time: 12/23/19  9:38 AM  Result Value Ref Range   Glucose-Capillary 253 (H) 70 - 99 mg/dL   ____________________________________________  EKG My review and personal interpretation at Time: 11:18   Indication: sepsis  Rate: 90  Rhythm: afib Axis: normal Other: nonspecific st abn, no stemi ____________________________________________  RADIOLOGY  I personally reviewed all radiographic images ordered to evaluate for the above acute complaints and reviewed radiology reports and findings.  These findings were personally discussed with the patient.  Please see medical record for radiology report.  ____________________________________________   PROCEDURES  Procedure(s) performed:  .Critical Care Performed by: Merlyn Lot, MD Authorized by: Merlyn Lot, MD   Critical care provider statement:    Critical care time (minutes):  35   Critical care time was exclusive of:  Separately billable procedures and treating other patients   Critical care was necessary to treat or prevent imminent or life-threatening deterioration of the following conditions:  Sepsis   Critical care was time spent personally by me on the following activities:  Development of treatment plan with patient or surrogate, discussions with consultants, evaluation of patient's response to treatment, examination of patient, obtaining history from patient or surrogate, ordering and performing treatments and interventions, ordering and review of laboratory studies, ordering and review of radiographic studies, pulse oximetry, re-evaluation of patient's condition and review of old charts  .1-3 Lead EKG  Interpretation Performed by: Merlyn Lot, MD Authorized by: Merlyn Lot, MD     Interpretation: normal     ECG rate:  110   ECG rate assessment: normal     Rhythm: sinus tachycardia     Ectopy: none     Conduction: normal        Critical Care performed: yes ____________________________________________   INITIAL IMPRESSION / ASSESSMENT AND PLAN / ED COURSE  Pertinent labs & imaging results that were available during my care of the patient were reviewed by me and considered in my medical decision making (see chart for details).   DDX: Pneumonia, urosepsis, bacteremia, Covid, cholelithiasis, cholecystitis, appendicitis   [name] is a 86 y.o. who presents to the ED with presentation as described above.  Patient arrives septic.  Also with acute respiratory failure with hypoxia.  She is protecting her airway O2 saturations improved on nasal cannula.  Given history of recent treatment for UTI I do suspect the source is urinary however the hypoxia raises suspicion for pneumonia.  She is vaccinated against Covid and just recently received her booster therefore I will lower suspicion for COVID-19.  Will give judicious IV fluid she is actually hypertensive right now will order lactate before deciding whether to give additional aggressive IV resuscitation.  Will obtain blood cultures as well as  start broad-spectrum antibiotics.  Her abdominal exam is soft and benign.  I do not believe this is consistent with cholecystitis appendicitis or other acute intra-abdominal process.  Have low suspicion for stone as she does not complain of any flank pain.  The patient will be placed on continuous pulse oximetry and telemetry for monitoring.  Laboratory evaluation will be sent to evaluate for the above complaints.     Clinical Course as of Dec 22 1117  Fri Dec 23, 2019  1004 Will order CT imaging is unable to find clear source of sepsis.   [PR]  8110 CT imaging without evidence of  acute intra-abdominal process.  May have some mild colitis but patient not complaining of the symptoms.  Given her hypoxia and findings on lower lungs on CT this is consistent with pneumonia.  Will discuss with hospitalist for admission.   [PR]    Clinical Course User Index [PR] Merlyn Lot, MD    The patient was evaluated in Emergency Department today for the symptoms described in the history of present illness. He/she was evaluated in the context of the global COVID-19 pandemic, which necessitated consideration that the patient might be at risk for infection with the SARS-CoV-2 virus that causes COVID-19. Institutional protocols and algorithms that pertain to the evaluation of patients at risk for COVID-19 are in a state of rapid change based on information released by regulatory bodies including the CDC and federal and state organizations. These policies and algorithms were followed during the patient's care in the ED.  As part of my medical decision making, I reviewed the following data within the [facility] notes reviewed and incorporated, Labs reviewed, notes from prior ED visits and [place] Controlled Substance Database   ____________________________________________   FINAL CLINICAL IMPRESSION(S) / ED DIAGNOSES  Final diagnoses:  Sepsis with acute hypoxic respiratory failure without septic shock, due to unspecified organism [facility])      NEW MEDICATIONS STARTED DURING THIS VISIT:  New Prescriptions   No medications on file     Note:  This document was prepared using Dragon voice recognition software and may include unintentional dictation errors.    Merlyn Lot, MD 12/23/19 1119

## 2019-12-23 NOTE — Evaluation (Signed)
Clinical/Bedside Swallow Evaluation Patient Details  Name: ISHANA BLADES MRN: [id number] Date of Birth: 1931/05/21  Today's Date: 12/23/2019 Time: SLP Start Time (ACUTE ONLY): 1440 SLP Stop Time (ACUTE ONLY): 1535 SLP Time Calculation (min) (ACUTE ONLY): 55 min  Past Medical History:  Past Medical History:  Diagnosis Date  . Basal cell carcinoma 03/30/2008   left upper arm/excision  . Basal cell carcinoma 03/31/2008   left upper arm, right lower leg  . Basal cell carcinoma 01/18/2009   left upper arm  . Chronic diarrhea   . Collagenous colitis   . Diabetes mellitus without complication ([place])   . Diverticulitis   . Heart murmur    at birth, none since  . History of echocardiogram    a. 11/2015: echo showing EF of 60-65% with no WMA. Grade 1 DD and mild MR noted.   . Hypertension   . PAF (paroxysmal atrial fibrillation) ([place])    a. initially diagnosed in 08/2016 --> started on Eliquis  . Syncope    a. initially occurring in Fall 2016 b. Hospitalized in 10/2015 and 11/2015 for recurrent episodes.    Past Surgical History:  Past Surgical History:  Procedure Laterality Date  . ABDOMINAL HYSTERECTOMY    . BACK SURGERY  08/08/2008   Lumbar discectomy  . COLONOSCOPY WITH PROPOFOL    . EXCISION/RELEASE BURSA HIP Left 05/03/2019   Procedure: HIP ABDUCTOR REPAIR;  Surgeon: Hessie Knows, MD;  Location: [facility] ORS;  Service: Orthopedics;  Laterality: Left;  . INTRAMEDULLARY (IM) NAIL INTERTROCHANTERIC Right 06/13/2019   Procedure: INTRAMEDULLARY (IM) NAIL INTERTROCHANTRIC;  Surgeon: Hessie Knows, MD;  Location: [facility] ORS;  Service: Orthopedics;  Laterality: Right;  . NECK SURGERY  1980   HPI:  Pt is a 86 y.o. female with medical history significant for diabetes mellitus on oral hypoglycemic agents, history of paroxysmal A. fib on anticoagulation therapy, history of diverticulitis, hypertension, and Old h/o Bell's Palsy(Left labial tone) who was brought into the ER for evaluation of  feeling unwell for several days.  Patient complains of feeling very weak, fatigued and unable to tolerate any oral intake.  She was noted to be febrile in the field with a T-max of 102F and was hypoxic with room air pulse oximetry of 88% and this improved with oxygen supplementation at 2 L.  Patient was recently treated for urinary tract infection and completed her last dose of antibiotic 1 day prior to her admission.  Urine culture yielded Enterococcus faecalis and patient was treated with Macrobid.  CT Abd. revealed Basilar airspace disease with nodular features in the dependent Left chest; Trace bilateral pleural effusions associated with airspace disease.  CXR: Minimal bibasilar subsegmental atelectasis; NO active disease noted in any CXRs in past 2 years per chart.  Pt resides at [facility].   Assessment / Plan / Recommendation Clinical Impression  Pt seen for BSE today while in the ED. Pt is currently NPO per chart, NSG. Per assessment, pt appears to present w/ adequate oropharyngeal phase swallow w/ No oropharyngeal phase dysphagia noted, No neuromuscular deficits noted.Pt consumed po trials w/ No overt, clinical s/s of aspiration during po trials. Pt appears at reduced risk for aspiration following general aspiration precautions. During po trials, pt consumed all consistencies w/ no overt coughing, decline in vocal quality, or change in respiratory presentation during/post trials. O2 sats remained 98%, RR and HR unchanged. Oral phase appeared [facility] w/ timely bolus management, mastication, and control of bolus propulsion for A-P transfer for swallowing.  Oral clearing achieved w/ all trial consistencies. OM Exam appeared [facility] w/ no lingual unilateral weakness noted -- pt exhibited, and endorsed, slight Left labial decreased tone(upper lip moreso) d/t Old dx of Bell's Palsy. This did not impact labial seal during po trials. Speech Clear. Pt fed self w/ setup support, positioning upright in  bed. Recommend a Regular consistency diet w/ Cut meats, moistened foods; Thin liquidsVIA CUP - pt does not often use straws at home. Recommend general aspiration precautions, Pills WHOLE in Puree for safer, easier swallowing IF easier for swallowing. Education given on Pills in Puree; food consistencies and easy to eat options; general aspiration precautions. NSG to reconsult if any new needs arise. Pt and Daughter agreed.  SLP Visit Diagnosis: Dysphagia, unspecified (R13.10)    Aspiration Risk   (reduced w/ general aspiration precautions)    Diet Recommendation  Regular consistency diet w/ Thin liquids; Cup drinking preferred. General aspiration precautions.  Medication Administration: Whole meds with liquid (but Whole in Puree for ease of swallowing if needed)    Other  Recommendations Recommended Consults:  (n/a) Oral Care Recommendations: Oral care BID;Oral care before and after PO;Patient independent with oral care Other Recommendations:  (n/a)   Follow up Recommendations None      Frequency and Duration  (n/a)   (n/a)       Prognosis Prognosis for Safe Diet Advancement: Good Barriers to Reach Goals:  (n/a)      Swallow Study   General Date of Onset: 12/23/19 HPI: Pt is a 86 y.o. female with medical history significant for diabetes mellitus on oral hypoglycemic agents, history of paroxysmal A. fib on anticoagulation therapy, history of diverticulitis, hypertension, and Old h/o Bell's Palsy(Left labial tone) who was brought into the ER for evaluation of feeling unwell for several days.  Patient complains of feeling very weak, fatigued and unable to tolerate any oral intake.  She was noted to be febrile in the field with a T-max of 102F and was hypoxic with room air pulse oximetry of 88% and this improved with oxygen supplementation at 2 L.  Patient was recently treated for urinary tract infection and completed her last dose of antibiotic 1 day prior to her admission.  Urine culture  yielded Enterococcus faecalis and patient was treated with Macrobid.  CT Abd. revealed Basilar airspace disease with nodular features in the dependent Left chest; Trace bilateral pleural effusions associated with airspace disease.  CXR: Minimal bibasilar subsegmental atelectasis; NO active disease noted in any CXRs in past 2 years per chart.  Pt resides at [facility]. Type of Study: Bedside Swallow Evaluation Previous Swallow Assessment: none Diet Prior to this Study: NPO (regular diet at home) Temperature Spikes Noted: No (wbc 12.9) Respiratory Status: Nasal cannula (2L) History of Recent Intubation: No Behavior/Cognition: Alert;Cooperative;Pleasant mood (min HOH) Oral Cavity Assessment: Within Functional Limits Oral Care Completed by SLP: Recent completion by staff Oral Cavity - Dentition: Adequate natural dentition Vision: Functional for self-feeding Self-Feeding Abilities: Able to feed self;Needs set up Patient Positioning: Upright in bed Baseline Vocal Quality: Normal Volitional Cough: Strong Volitional Swallow: Able to elicit    Oral/Motor/Sensory Function Overall Oral Motor/Sensory Function: Within functional limits   Ice Chips Ice chips: Within functional limits Presentation: Spoon (fed 2 trials)   Thin Liquid Thin Liquid: Within functional limits Presentation: Cup;Self Fed;Straw (4-5 trials via sip; ~3 ozs via Cup)    Nectar Thick Nectar Thick Liquid: Not tested   Honey Thick Honey Thick Liquid: Not tested  Puree Puree: Within functional limits Presentation: Spoon;Self Fed (6 trials)   Solid     Solid: Within functional limits Presentation: Self Fed (4 trials)        Orinda Kenner, MS, [place] Speech Language Pathologist Rehab Services [phone] Jethro Radke 12/23/2019,3:39 PM

## 2019-12-23 NOTE — ED Notes (Signed)
Called to give report but be was taken away. Awaiting bed assignment.

## 2019-12-23 NOTE — ED Notes (Signed)
Daughter at bedside.

## 2019-12-23 NOTE — Progress Notes (Signed)
PHARMACY -  BRIEF ANTIBIOTIC NOTE   Pharmacy has received consult(s) for vancomycin and cefepime from an ED provider.  The patient's profile has been reviewed for ht/wt/allergies/indication/available labs.    One time order(s) placed for vanc 1 g + cefepime 2 g  Further antibiotics/pharmacy consults should be ordered by admitting physician if indicated.                       Thank you,  Tawnya Crook, PharmD 12/23/2019  7:44 AM

## 2019-12-23 NOTE — ED Notes (Signed)
Patients BP continues to decrease. Last reading 78/51. MD notified and advised to give 1 liter LR fluid bolus.

## 2019-12-23 NOTE — ED Notes (Signed)
Called to give report but RN on 2C advised that they are refusing patient due to her BP trending low. Advised that patient's map is adequate and responding to fluid bolus. Stated that they are refusing her care. Bed unassigned for 2nd time. Message sent to Agbata MD to increase level of care

## 2019-12-23 NOTE — H&P (Signed)
History and Physical    [name]:[id number] DOB: [birth year] DOA: 12/23/2019  PCP: Margo Common, PA   Patient coming from: Assisted Living  I have personally briefly reviewed patient's old medical records in [place]   Chief Complaint: Weakness  HPI: [name] is a 86 y.o. female with medical history significant for diabetes mellitus on oral hypoglycemic agents, history of paroxysmal A. fib on anticoagulation therapy, history of diverticulitis, hypertension who was brought into the ER for evaluation of feeling unwell for several days.  Patient complains of feeling very weak, fatigued and unable to tolerate any oral intake.  She was noted to be febrile in the field with a T-max of 102F and was hypoxic with room air pulse oximetry of 88% and this improved with oxygen supplementation at 2 L. Patient was recently treated for urinary tract infection and completed her last dose of antibiotic 1 day prior to her admission.  Urine culture yielded Enterococcus faecalis and patient was treated with Macrobid. She complains of feeling unwell and having increased thirst and poor oral intake with associated nausea but no vomiting.   She denies having any pain, no cough, no congestion, no shortness of breath, no dizziness, no lightheadedness or any changes in her bowel habits. Labs show sodium of 135, potassium 3.7, chloride 101, bicarb 21, glucose 277, BUN 45, creatinine 1.08, calcium 8.8, alkaline phosphatase 133, albumin 3.3, AST 32, ALT 71, total protein 6.6, total bilirubin 1.4, lactic acid 1.5, white count 12.9 with a left shift, hemoglobin 12.0, hematocrit 35.2, MCV 88.4, RDW 12.5, platelet count 214, PT 21.7, INR 2.0  Respiratory viral panel is negative CT scan of abdomen and pelvis showed basilar airspace disease with nodular features in the dependent left chest, concerning for pneumonia or aspiration related changes.  Trace bilateral pleural effusions associated with  airspace disease.  Pan diverticulosis Chest x-ray reviewed by me shows increased haziness in the left lower lobe. Twelve-lead EKG reviewed by me shows atrial fibrillation    ED Course: Patient is an 86 year old elderly female who was sent to the emergency room from assisted living facility for evaluation of feeling unwell.  Patient had a fever with a T-max of 102F as well as hypoxia with room air pulse oximetry of 88% that improved with oxygen supplementation at 2 L.  She has marked leukocytosis with a left shift and slight worsening of her renal function from baseline 0.80 >> 1.08.  CT scan of abdomen and pelvis shows a left lower lobe infiltrate suggestive of possible aspiration.  Patient received IV antibiotic therapy in the emergency room will be admitted to the hospital for further evaluation.  Review of Systems: As per HPI otherwise 10 point review of systems negative.    Past Medical History:  Diagnosis Date  . Basal cell carcinoma 03/30/2008   left upper arm/excision  . Basal cell carcinoma 03/31/2008   left upper arm, right lower leg  . Basal cell carcinoma 01/18/2009   left upper arm  . Chronic diarrhea   . Collagenous colitis   . Diabetes mellitus without complication ([place])   . Diverticulitis   . Heart murmur    at birth, none since  . History of echocardiogram    a. 11/2015: echo showing EF of 60-65% with no WMA. Grade 1 DD and mild MR noted.   . Hypertension   . PAF (paroxysmal atrial fibrillation) ([place])    a. initially diagnosed in 08/2016 --> started on Eliquis  .  Syncope    a. initially occurring in Fall 2016 b. Hospitalized in 10/2015 and 11/2015 for recurrent episodes.     Past Surgical History:  Procedure Laterality Date  . ABDOMINAL HYSTERECTOMY    . BACK SURGERY  08/08/2008   Lumbar discectomy  . COLONOSCOPY WITH PROPOFOL    . EXCISION/RELEASE BURSA HIP Left 05/03/2019   Procedure: HIP ABDUCTOR REPAIR;  Surgeon: Hessie Knows, MD;  Location: [facility] ORS;   Service: Orthopedics;  Laterality: Left;  . INTRAMEDULLARY (IM) NAIL INTERTROCHANTERIC Right 06/13/2019   Procedure: INTRAMEDULLARY (IM) NAIL INTERTROCHANTRIC;  Surgeon: Hessie Knows, MD;  Location: [facility] ORS;  Service: Orthopedics;  Laterality: Right;  . [place]     reports that she quit smoking about 41 years ago. She has never used smokeless tobacco. She reports current alcohol use. She reports that she does not use drugs.  Allergies  Allergen Reactions  . Morphine Sulfate Other (See Comments)    BP bottoms out  . Penicillins Diarrhea    Has patient had a PCN reaction causing immediate rash, facial/tongue/throat swelling, SOB or lightheadedness with hypotension: Yes Has patient had a PCN reaction causing severe rash involving mucus membranes or skin necrosis: Yes Has patient had a PCN reaction that required hospitalization No Has patient had a PCN reaction occurring within the last 10 years: No If all of the above answers are "NO", then may proceed with Cephalosporin use.    Family History  Problem Relation Age of Onset  . Hypertension Mother   . Diabetes Mother   . Lung cancer Father      Prior to Admission medications   Medication Sig Start Date End Date Taking? Authorizing Provider  acetaminophen (TYLENOL) 500 MG tablet Take 1,000 mg by mouth in the morning and at bedtime.    Yes [provider]  apixaban (ELIQUIS) 5 MG TABS tablet Take 1 tablet (5 mg total) by mouth 2 (two) times daily. 04/13/19  Yes End, Harrell Gave, MD  Cholecalciferol (VITAMIN D) 2000 UNITS CAPS Take 2,000 Units by mouth daily.    Yes [provider]  Clobetasol Propionate 0.05 % shampoo 2- 3 times per week lather on scalp, leave on 20 minutes, wash out. 11/09/19  Yes Brendolyn Patty, MD  Fluocinolone Acetonide Scalp 0.01 % OIL Apply to scalp in evening, cover with shower cap, wash off in morning. Use on evenings prior to shampooing next day. 11/09/19  Yes Brendolyn Patty, MD  furosemide  (LASIX) 20 MG tablet Take 1 tablet (20 mg total) by mouth daily. (only if needed). 12/05/19  Yes Chrismon, Vickki Muff, PA  glipiZIDE (GLUCOTROL XL) 5 MG 24 hr tablet TALE 1 TABLET BY MOUTH ONCE DAILY WITH BREAKFAST 12/05/19  Yes Chrismon, Vickki Muff, PA  lisinopril (ZESTRIL) 20 MG tablet Take 1 tablet (20 mg total) by mouth daily. 10/12/19 01/10/20 Yes End, Harrell Gave, MD  metFORMIN (GLUCOPHAGE) 500 MG tablet TAKE 1 TABLET BY MOUTH TWICE DAILY WITH MEALS 09/23/19  Yes Jerrol Banana., MD  metoprolol succinate (TOPROL-XL) 25 MG 24 hr tablet TAKE 1 TABLET BY MOUTH ONCE DAILY 11/21/19  Yes End, Harrell Gave, MD  Multiple Vitamin (MULTIVITAMIN WITH MINERALS) TABS tablet Take 1 tablet by mouth daily.   Yes [provider]  simvastatin (ZOCOR) 20 MG tablet TAKE ONE TABLET BY MOUTH EVERY NIGHT AT BEDTIME Patient taking differently: Take 20 mg by mouth at bedtime.  04/05/19  Yes Chrismon, Vickki Muff, PA    Physical Exam: Vitals:   12/23/19 0900  12/23/19 0930 12/23/19 1034 12/23/19 1100  BP: (!) 119/58 106/60 (!) 97/54 (!) 94/50  Pulse: (!) 108 (!) 110 (!) 112 (!) 104  Resp: (!) 21 (!) 33 (!) 26 18  Temp: 99.1 F (37.3 C)     TempSrc: Oral     SpO2: 95% 96% 96% 94%  Weight:      Height:         Vitals:   12/23/19 0900 12/23/19 0930 12/23/19 1034 12/23/19 1100  BP: (!) 119/58 106/60 (!) 97/54 (!) 94/50  Pulse: (!) 108 (!) 110 (!) 112 (!) 104  Resp: (!) 21 (!) 33 (!) 26 18  Temp: 99.1 F (37.3 C)     TempSrc: Oral     SpO2: 95% 96% 96% 94%  Weight:      Height:        Constitutional: NAD, alert and oriented x 3.  Acutely ill-appearing Eyes: PERRL, lids and conjunctivae normal ENMT: Mucous membranes are dry.  Neck: normal, supple, no masses, no thyromegaly Respiratory: Rhonchi at the bases, no wheezing, faint crackles. Normal respiratory effort. No accessory muscle use.  Cardiovascular: Irregularly irregular, tachycardic,no murmurs / rubs / gallops. No extremity edema. 2+ pedal  pulses. No carotid bruits.  Abdomen: Suprapubic tenderness, no masses palpated. No hepatosplenomegaly. Bowel sounds positive.  Musculoskeletal: no clubbing / cyanosis. No joint deformity upper and lower extremities.  Skin: no rashes, lesions, ulcers.  Neurologic: No gross focal neurologic deficit.  Generalized weakness Psychiatric: Normal mood and affect.   Labs on Admission: I have personally reviewed following labs and imaging studies  CBC: Recent Labs  Lab 12/23/19 0740  WBC 12.9*  NEUTROABS 9.9*  HGB 12.0  HCT 35.2*  MCV 88.4  PLT 154   Basic Metabolic Panel: Recent Labs  Lab 12/23/19 0740  NA 135  K 3.7  CL 101  CO2 21*  GLUCOSE 277*  BUN 45*  CREATININE 1.08*  CALCIUM 8.8*   GFR: Estimated Creatinine Clearance: 32.6 mL/min (A) (by C-G formula based on SCr of 1.08 mg/dL (H)). Liver Function Tests: Recent Labs  Lab 12/23/19 0740  AST 32  ALT 71*  ALKPHOS 133*  BILITOT 1.4*  PROT 6.6  ALBUMIN 3.2*   No results for input(s): LIPASE, AMYLASE in the last 168 hours. No results for input(s): AMMONIA in the last 168 hours. Coagulation Profile: Recent Labs  Lab 12/23/19 0815  INR 2.0*   Cardiac Enzymes: No results for input(s): CKTOTAL, CKMB, CKMBINDEX, TROPONINI in the last 168 hours. BNP (last 3 results) No results for input(s): PROBNP in the last 8760 hours. HbA1C: No results for input(s): HGBA1C in the last 72 hours. CBG: Recent Labs  Lab 12/23/19 0938  GLUCAP 253*   Lipid Profile: No results for input(s): CHOL, HDL, LDLCALC, TRIG, CHOLHDL, LDLDIRECT in the last 72 hours. Thyroid Function Tests: No results for input(s): TSH, T4TOTAL, FREET4, T3FREE, THYROIDAB in the last 72 hours. Anemia Panel: No results for input(s): VITAMINB12, FOLATE, FERRITIN, TIBC, IRON, RETICCTPCT in the last 72 hours. Urine analysis:    Component Value Date/Time   COLORURINE YELLOW (A) 12/23/2019 0740   APPEARANCEUR HAZY (A) 12/23/2019 0740   APPEARANCEUR Clear  11/21/2015 0919   LABSPEC 1.009 12/23/2019 0740   PHURINE 5.0 12/23/2019 0740   GLUCOSEU NEGATIVE 12/23/2019 0740   HGBUR NEGATIVE 12/23/2019 0740   BILIRUBINUR NEGATIVE 12/23/2019 0740   BILIRUBINUR neg 12/05/2019 1206   BILIRUBINUR Negative 11/21/2015 0919   KETONESUR 20 (A) 12/23/2019 0740   PROTEINUR 30 (A)  12/23/2019 0740   UROBILINOGEN 0.2 12/05/2019 1206   NITRITE NEGATIVE 12/23/2019 0740   LEUKOCYTESUR NEGATIVE 12/23/2019 0740    Radiological Exams on Admission: CT ABDOMEN PELVIS W CONTRAST  Result Date: 12/23/2019 CLINICAL DATA:  Abdominal infection, abscess suspected febrile and hypoxic on room air EXAM: CT ABDOMEN AND PELVIS WITH CONTRAST TECHNIQUE: Multidetector CT imaging of the abdomen and pelvis was performed using the standard protocol following bolus administration of intravenous contrast. CONTRAST:  19mL OMNIPAQUE IOHEXOL 300 MG/ML  SOLN COMPARISON:  12/28/2017 FINDINGS: Lower chest: Basilar airspace disease with nodular features in the dependent LEFT chest. Small effusions bilaterally. Heart is incompletely imaged without pericardial effusion. Signs of coronary artery calcification. Hepatobiliary: Liver without focal lesion. No pericholecystic stranding. The portal vein is patent. Hepatic veins are patent. Pancreas: Pancreas without ductal dilation or sign of inflammation. Spleen: Spleen normal in size and contour. Adrenals/Urinary Tract: Adrenal glands are normal. Malrotated, ptotic RIGHT kidney. Symmetric renal enhancement. Small cysts in the bilateral kidneys. Interval decrease in size and slight increase in density of an interpolar RIGHT renal cyst. Just at 30 Hounsfield units on portal venous phase. No hydronephrosis. Urinary bladder mildly distended.  No perivesical stranding. Stomach/Bowel: Stomach under distended limiting assessment. No adjacent stranding. No acute small bowel process. Colonic diverticulosis. Question mild thickening versus under distension of the  ascending through the mid transverse colon. The appendix is normal. Vascular/Lymphatic: Calcified and noncalcified atheromatous plaque of the abdominal aorta. No aneurysmal dilation. There is no gastrohepatic or hepatoduodenal ligament lymphadenopathy. No retroperitoneal or mesenteric lymphadenopathy. Top-normal sized periportal lymph nodes may be reactive Reproductive: Post hysterectomy without adnexal mass. No pelvic adenopathy Other: No ascites.  Small fat containing umbilical hernia. Musculoskeletal: Post ORIF RIGHT proximal femur partially imaged. Osteopenia. Spinal degenerative changes. IMPRESSION: 1. Basilar airspace disease with nodular features in the dependent LEFT chest, concerning for pneumonia or aspiration related changes. Follow-up to ensure resolution is suggested. 2. Trace bilateral pleural effusions associated with airspace disease. 3. Query mild thickening of the transverse colon. Also the ascending colon without significant surrounding stranding correlate with any signs of colitis though this is likely due to under distension. There is pancolonic diverticulosis worse in the sigmoid colon without overt signs of inflammation. 4. Aortic atherosclerosis. Aortic Atherosclerosis (ICD10-I70.0). Electronically Signed   By: Zetta Bills M.D.   On: 12/23/2019 10:44   DG Chest Port 1 View  Result Date: 12/23/2019 CLINICAL DATA:  Sepsis. EXAM: PORTABLE CHEST 1 VIEW COMPARISON:  June 11, 2019. FINDINGS: The heart size and mediastinal contours are within normal limits. No pneumothorax or pleural effusion is noted. Stable elevated left hemidiaphragm is noted. Minimal bibasilar subsegmental atelectasis is noted. The visualized skeletal structures are unremarkable. IMPRESSION: Minimal bibasilar subsegmental atelectasis. Electronically Signed   By: Marijo Conception M.D.   On: 12/23/2019 08:07    EKG: Independently reviewed.  Atrial fibrillation  Assessment/Plan Principal Problem:   Sepsis  ([place]) Active Problems:   Essential hypertension   Diabetes mellitus, type 2 (HCC)   Paroxysmal atrial fibrillation (HCC)   Dehydration   Aspiration pneumonia (HCC)   Acute respiratory failure (HCC)     Sepsis ([facility]) with acute respiratory failure As evidenced by fever with a T-max of 102 F, hypotension tachycardia, tachypnea, leukocytosis with a left shift and findings suggestive of aspiration pneumonia in the left lower lobe Lactic acid is within normal limits Patient noted to have room air pulse oximeter of 88% and improved following oxygen supplementation Aggressive IV fluid hydration We will  place patient on Unasyn for suspected aspiration pneumonia We will consult speech therapy for swallow function evaluation Follow-up results of blood cultures   Acute respiratory failure As evidenced by tachypnea and room air pulse oximetry of 88% on room air that improved with oxygen supplementation at 2 L Acute respiratory failure secondary to aspiration pneumonia We will attempt to wean patient off oxygen following resolution of her acute infectious process    Aspiration pneumonia Patient presents for evaluation of feeling unwell and had a fever and was hypoxic in the field  CT scan of abdomen and pelvis showed basilar airspace disease with nodular features in the dependent LEFT chest, concerning for pneumonia or aspiration related changes. Follow-up to ensure resolution is suggested. We will place patient empirically on Unasyn for suspected aspiration pneumonia Speech therapy for swallow function evaluation     History of A. Fib Continue apixaban as primary prophylaxis for an acute stroke Hold metoprolol since patient is hypotensive   Hypertension Hold metoprolol and lisinopril due to hypotension   Diabetes mellitus On oral hypoglycemic agents Patient will remain n.p.o. until seen and evaluated by speech therapy Sliding scale insulin for glycemic  control    Dehydration From poor oral intake with concomitant diuretic use At baseline patient has a serum creatinine of 0.8 and today on admission it is 1.08 We will hydrate patient and repeat renal parameters in a.m.      DVT prophylaxis: Apixaban Code Status: DO NOT RESUSCITATE Family Communication: Greater than 50% of time was spent discussing patient's condition and plan of care with her at the bedside.  She verbalizes understanding and agrees with the plan.  CODE STATUS was discussed and she is a DO NOT RESUSCITATE Disposition Plan: Back to previous home environment Consults called: None    Roxas Clymer MD [facility]     12/23/2019, 11:33 AM

## 2019-12-23 NOTE — Progress Notes (Signed)
Pharmacy Antibiotic Note  [name] is a 86 y.o. female admitted on 12/23/2019 with aspiration pneumonia.  Pharmacy has been consulted for Unasyn dosing. Patient does have Penicillin listed as allergy but reaction is diarrhea.  Plan: Unasyn 3g IV q8h  Height: 5' 1.5" (156.2 cm) Weight: 64.4 kg (142 lb) IBW/kg (Calculated) : 48.95  Temp (24hrs), Avg:100.5 F (38.1 C), Min:99.1 F (37.3 C), Max:101.9 F (38.8 C)  Recent Labs  Lab 12/23/19 0740  WBC 12.9*  CREATININE 1.08*  LATICACIDVEN 1.5    Estimated Creatinine Clearance: 32.6 mL/min (A) (by C-G formula based on SCr of 1.08 mg/dL (H)).    Allergies  Allergen Reactions   Morphine Sulfate Other (See Comments)    BP bottoms out   Penicillins Diarrhea    Has patient had a PCN reaction causing immediate rash, facial/tongue/throat swelling, SOB or lightheadedness with hypotension: Yes Has patient had a PCN reaction causing severe rash involving mucus membranes or skin necrosis: Yes Has patient had a PCN reaction that required hospitalization No Has patient had a PCN reaction occurring within the last 10 years: No If all of the above answers are "NO", then may proceed with Cephalosporin use.    Antimicrobials this admission: Vancomycin/Cefepime/Metronidazole 10/15 x 1 in ED Unasyn 10/15 >>   Dose adjustments this admission:  Microbiology results:  Thank you for allowing pharmacy to be a part of this patients care.  Paulina Fusi, PharmD, BCPS 12/23/2019 11:26 AM

## 2019-12-24 DIAGNOSIS — R6520 Severe sepsis without septic shock: Secondary | ICD-10-CM | POA: Diagnosis not present

## 2019-12-24 DIAGNOSIS — A419 Sepsis, unspecified organism: Secondary | ICD-10-CM | POA: Diagnosis not present

## 2019-12-24 DIAGNOSIS — J9601 Acute respiratory failure with hypoxia: Secondary | ICD-10-CM | POA: Diagnosis not present

## 2019-12-24 DIAGNOSIS — L8990 Pressure ulcer of unspecified site, unspecified stage: Secondary | ICD-10-CM | POA: Insufficient documentation

## 2019-12-24 LAB — PROTIME-INR
INR: 2.1 — ABNORMAL HIGH (ref 0.8–1.2)
Prothrombin Time: 22.5 seconds — ABNORMAL HIGH (ref 11.4–15.2)

## 2019-12-24 LAB — BASIC METABOLIC PANEL
Anion gap: 9 (ref 5–15)
BUN: 35 mg/dL — ABNORMAL HIGH (ref 8–23)
CO2: 24 mmol/L (ref 22–32)
Calcium: 8.5 mg/dL — ABNORMAL LOW (ref 8.9–10.3)
Chloride: 106 mmol/L (ref 98–111)
Creatinine, Ser: 1.03 mg/dL — ABNORMAL HIGH (ref 0.44–1.00)
GFR, Estimated: 49 mL/min — ABNORMAL LOW (ref >60)
Glucose, Bld: 205 mg/dL — ABNORMAL HIGH (ref 70–99)
Potassium: 3.6 mmol/L (ref 3.5–5.1)
Sodium: 139 mmol/L (ref 135–145)

## 2019-12-24 LAB — URINE CULTURE: Culture: NO GROWTH

## 2019-12-24 LAB — CBC
HCT: 27.9 % — ABNORMAL LOW (ref 36.0–46.0)
Hemoglobin: 9.3 g/dL — ABNORMAL LOW (ref 12.0–15.0)
MCH: 29.7 pg (ref 26.0–34.0)
MCHC: 33.3 g/dL (ref 30.0–36.0)
MCV: 89.1 fL (ref 80.0–100.0)
Platelets: 184 10*3/uL (ref 150–400)
RBC: 3.13 MIL/uL — ABNORMAL LOW (ref 3.87–5.11)
RDW: 12.9 % (ref 11.5–15.5)
WBC: 8.2 10*3/uL (ref 4.0–10.5)
nRBC: 0 % (ref 0.0–0.2)

## 2019-12-24 LAB — GLUCOSE, CAPILLARY
Glucose-Capillary: 122 mg/dL — ABNORMAL HIGH (ref 70–99)
Glucose-Capillary: 186 mg/dL — ABNORMAL HIGH (ref 70–99)
Glucose-Capillary: 194 mg/dL — ABNORMAL HIGH (ref 70–99)
Glucose-Capillary: 208 mg/dL — ABNORMAL HIGH (ref 70–99)
Glucose-Capillary: 210 mg/dL — ABNORMAL HIGH (ref 70–99)
Glucose-Capillary: 217 mg/dL — ABNORMAL HIGH (ref 70–99)

## 2019-12-24 LAB — PROCALCITONIN: Procalcitonin: 5.71 ng/mL

## 2019-12-24 LAB — MRSA PCR SCREENING: MRSA by PCR: NEGATIVE

## 2019-12-24 LAB — CORTISOL-AM, BLOOD: Cortisol - AM: 14.8 ug/dL (ref 6.7–22.6)

## 2019-12-24 MED ORDER — AZITHROMYCIN 250 MG PO TABS
500.0000 mg | ORAL_TABLET | Freq: Every day | ORAL | Status: DC
  Administered 2019-12-24 – 2019-12-27 (×4): 500 mg via ORAL
  Filled 2019-12-24 (×4): qty 2

## 2019-12-24 MED ORDER — GUAIFENESIN ER 600 MG PO TB12
600.0000 mg | ORAL_TABLET | Freq: Two times a day (BID) | ORAL | Status: DC
  Administered 2019-12-24 – 2019-12-27 (×6): 600 mg via ORAL
  Filled 2019-12-24 (×6): qty 1

## 2019-12-24 MED ORDER — SODIUM CHLORIDE 0.9 % IV SOLN
1.0000 g | INTRAVENOUS | Status: DC
  Administered 2019-12-24 – 2019-12-26 (×3): 1 g via INTRAVENOUS
  Filled 2019-12-24 (×4): qty 10

## 2019-12-24 MED ORDER — FLUTICASONE PROPIONATE 50 MCG/ACT NA SUSP
1.0000 | Freq: Every day | NASAL | Status: DC
  Administered 2019-12-24 – 2019-12-27 (×4): 1 via NASAL
  Filled 2019-12-24: qty 16

## 2019-12-24 NOTE — Progress Notes (Signed)
PROGRESS NOTE    [name]  HUT:[id number] DOB: [birth year] DOA: 12/23/2019 PCP: Margo Common, PA    Assessment & Plan:   Principal Problem:   Sepsis [facility]) Active Problems:   Essential hypertension   Diabetes mellitus, type 2 ([place])   Paroxysmal atrial fibrillation ([place])   Dehydration   Aspiration pneumonia ([place])   Acute respiratory failure ([place])   Pressure injury of skin    [name] is a 86 y.o. female with medical history significant for diabetes mellitus on oral hypoglycemic agents, history of paroxysmal A. fib on anticoagulation therapy, history of diverticulitis, hypertension who was brought into the ER for evaluation of feeling unwell for several days.  Patient complains of feeling very weak, fatigued and unable to tolerate any oral intake.  She was noted to be febrile in the field with a T-max of 102F and was hypoxic with room air pulse oximetry of 88% and this improved with oxygen supplementation at 2 L.   Sepsis (POA)  CAP As evidenced by fever with a T-max of 102 F, tachycardia, tachypnea, leukoytosis with a left shift.  CT showed basilar airspace disease.   Patient noted to have room air pulse oximeter of 88% and improved following 2L oxygen supplementation. --Pt received IV fluid and started on Unasyn for suspected aspiration pneumonia PLAN: --swtich abx to ceftriaxone and azithromycin for CAP treatment --SLP eval, found no overt aspiration  Acute hypoxic respiratory failure 2/2 CAP As evidenced by tachypnea and room air pulse oximetry of 88% on room air that improved with oxygen supplementation at 2 L PLAN: --treat for CAP with ceftriaxone and azithromycin --wean O2   History of A. Fib  Acquired thrombophilia --Hold metop for now due to low BP --continue Eliquis   Hypertension Hold metoprolol and lisinopril due to hypotension  Diabetes mellitus Hold home oral anti-glycemics agents SSI   DVT prophylaxis: Lovenox SQ Code Status:  DNR  Family Communication: daughter-in-law updated at the bedside Status is: inpatient Dispo:   The patient is from: home Anticipated d/c is to: home Anticipated d/c date is: 2-3 days Patient currently is not medically stable to d/c due to: CAP with acute hypoxia on IV abx   Subjective and Interval History:  Pt reported feeling better.  Starting to have a dry cough.   Objective: Vitals:   12/24/19 1336 12/24/19 1540 12/24/19 1945 12/24/19 2344  BP:  128/79 131/66 133/62  Pulse:  100 (!) 101 (!) 101  Resp:  18 20 20   Temp:  98.6 F (37 C) 98.4 F (36.9 C) 98.9 F (37.2 C)  TempSrc:  Oral Oral Oral  SpO2: 95% 98% 99% 95%  Weight:      Height:        Intake/Output Summary (Last 24 hours) at 12/24/2019 2345 Last data filed at 12/24/2019 2329 Gross per 24 hour  Intake 1831.62 ml  Output 1825 ml  Net 6.62 ml   Filed Weights   12/23/19 1855 12/24/19 0229 12/24/19 0413  Weight: 71 kg 73.8 kg 73.8 kg    Examination:   Constitutional: NAD, AAOx3 HEENT: conjunctivae and lids normal, EOMI CV: RRR no M,R,G. Distal pulses +2.  No cyanosis.   RESP: reduced lung sounds at posterior bases, on 2L GI: +BS, NTND Extremities: No effusions, edema, or tenderness in BLE SKIN: warm, dry and intact Neuro: II - XII grossly intact.  Sensation intact Psych: Normal mood and affect.  Appropriate judgement and reason   Data Reviewed: I have personally  reviewed following labs and imaging studies  CBC: Recent Labs  Lab 12/23/19 0740 12/24/19 0522  WBC 12.9* 8.2  NEUTROABS 9.9*  --   HGB 12.0 9.3*  HCT 35.2* 27.9*  MCV 88.4 89.1  PLT 214 220   Basic Metabolic Panel: Recent Labs  Lab 12/23/19 0740 12/24/19 0522  NA 135 139  K 3.7 3.6  CL 101 106  CO2 21* 24  GLUCOSE 277* 205*  BUN 45* 35*  CREATININE 1.08* 1.03*  CALCIUM 8.8* 8.5*   GFR: Estimated Creatinine Clearance: 36.5 mL/min (A) (by C-G formula based on SCr of 1.03 mg/dL (H)). Liver Function Tests: Recent Labs   Lab 12/23/19 0740  AST 32  ALT 71*  ALKPHOS 133*  BILITOT 1.4*  PROT 6.6  ALBUMIN 3.2*   No results for input(s): LIPASE, AMYLASE in the last 168 hours. No results for input(s): AMMONIA in the last 168 hours. Coagulation Profile: Recent Labs  Lab 12/23/19 0815 12/24/19 0522  INR 2.0* 2.1*   Cardiac Enzymes: No results for input(s): CKTOTAL, CKMB, CKMBINDEX, TROPONINI in the last 168 hours. BNP (last 3 results) No results for input(s): PROBNP in the last 8760 hours. HbA1C: No results for input(s): HGBA1C in the last 72 hours. CBG: Recent Labs  Lab 12/24/19 0411 12/24/19 0810 12/24/19 1142 12/24/19 1608 12/24/19 2106  GLUCAP 186* 217* 210* 122* 208*   Lipid Profile: No results for input(s): CHOL, HDL, LDLCALC, TRIG, CHOLHDL, LDLDIRECT in the last 72 hours. Thyroid Function Tests: No results for input(s): TSH, T4TOTAL, FREET4, T3FREE, THYROIDAB in the last 72 hours. Anemia Panel: No results for input(s): VITAMINB12, FOLATE, FERRITIN, TIBC, IRON, RETICCTPCT in the last 72 hours. Sepsis Labs: Recent Labs  Lab 12/23/19 0740 12/24/19 0522  PROCALCITON  --  5.71  LATICACIDVEN 1.5  --     Recent Results (from the past 240 hour(s))  Blood Culture (routine x 2)     Status: None (Preliminary result)   Collection Time: 12/23/19  7:40 AM   Specimen: BLOOD  Result Value Ref Range Status   Specimen Description BLOOD BLOOD LEFT FOREARM  Final   Special Requests   Final    BOTTLES DRAWN AEROBIC AND ANAEROBIC Blood Culture adequate volume   Culture   Final    NO GROWTH < 24 HOURS Performed at [facility], [address] [place], [place] [address]    Report Status PENDING  Incomplete  Urine culture     Status: None   Collection Time: 12/23/19  7:40 AM   Specimen: In/Out Cath Urine  Result Value Ref Range Status   Specimen Description   Final    IN/OUT CATH URINE Performed at [facility], [address] [place], [place] [address]    Special  Requests   Final    NONE Performed at [facility], [address], [place], [place] [address]    Culture   Final    NO GROWTH Performed at [facility], [place] [address], [place],  [address]    Report Status 12/24/2019 FINAL  Final  Respiratory Panel by RT PCR (Flu A&B, Covid) - Nasopharyngeal Swab     Status: None   Collection Time: 12/23/19  7:40 AM   Specimen: Nasopharyngeal Swab  Result Value Ref Range Status   SARS Coronavirus 2 by RT PCR NEGATIVE NEGATIVE Final    Comment: (NOTE) SARS-CoV-2 target nucleic acids are NOT DETECTED.  The SARS-CoV-2 RNA is generally detectable in upper respiratoy specimens during  the acute phase of infection. The lowest concentration of SARS-CoV-2 viral copies this assay can detect is 131 copies/mL. A negative result does not preclude SARS-Cov-2 infection and should not be used as the sole basis for treatment or other patient management decisions. A negative result may occur with  improper specimen collection/handling, submission of specimen other than nasopharyngeal swab, presence of viral mutation(s) within the areas targeted by this assay, and inadequate number of viral copies (<131 copies/mL). A negative result must be combined with clinical observations, patient history, and epidemiological information. The expected result is Negative.  Fact Sheet for Patients:  PinkCheek.be  Fact Sheet for Healthcare Providers:  GravelBags.it  This test is no t yet approved or cleared by the [place] FDA and  has been authorized for detection and/or diagnosis of SARS-CoV-2 by FDA under an Emergency Use Authorization (EUA). This EUA will remain  in effect (meaning this test can be used) for the duration of the COVID-19 declaration under Section 564(b)(1) of the Act, 21 U.S.C. section 360bbb-3(b)(1), unless the authorization is terminated or revoked sooner.      Influenza A by PCR NEGATIVE NEGATIVE Final   Influenza B by PCR NEGATIVE NEGATIVE Final    Comment: (NOTE) The Xpert Xpress SARS-CoV-2/FLU/RSV assay is intended as an aid in  the diagnosis of influenza from Nasopharyngeal swab specimens and  should not be used as a sole basis for treatment. Nasal washings and  aspirates are unacceptable for Xpert Xpress SARS-CoV-2/FLU/RSV  testing.  Fact Sheet for Patients: PinkCheek.be  Fact Sheet for Healthcare Providers: GravelBags.it  This test is not yet approved or cleared by the [place] FDA and  has been authorized for detection and/or diagnosis of SARS-CoV-2 by  FDA under an Emergency Use Authorization (EUA). This EUA will remain  in effect (meaning this test can be used) for the duration of the  Covid-19 declaration under Section 564(b)(1) of the Act, 21  U.S.C. section 360bbb-3(b)(1), unless the authorization is  terminated or revoked. Performed at [facility], [address], [place], [place] [address]   Blood Culture (routine x 2)     Status: None (Preliminary result)   Collection Time: 12/23/19  8:15 AM   Specimen: BLOOD  Result Value Ref Range Status   Specimen Description BLOOD BLOOD LEFT HAND  Final   Special Requests   Final    BOTTLES DRAWN AEROBIC AND ANAEROBIC Blood Culture results may not be optimal due to an inadequate volume of blood received in culture bottles   Culture   Final    NO GROWTH < 24 HOURS Performed at  [facility], [address], [place], [place] [address]    Report Status PENDING  Incomplete  MRSA PCR Screening     Status: None   Collection Time: 12/24/19  5:30 PM   Specimen: Nasopharyngeal  Result Value Ref Range Status   MRSA by PCR NEGATIVE NEGATIVE Final    Comment:        The GeneXpert MRSA Assay (FDA approved for NASAL specimens only), is one component of a comprehensive MRSA colonization surveillance program.  It is not intended to diagnose MRSA infection nor to guide or monitor treatment for MRSA infections. Performed at [facility], [address] [place], [place] [address]       Radiology Studies: CT ABDOMEN PELVIS W CONTRAST  Result Date: 12/23/2019 CLINICAL DATA:  Abdominal infection, abscess suspected febrile and hypoxic on room air EXAM: CT ABDOMEN AND PELVIS WITH CONTRAST TECHNIQUE:  Multidetector CT imaging of the abdomen and pelvis was performed using the standard protocol following bolus administration of intravenous contrast. CONTRAST:  55mL OMNIPAQUE IOHEXOL 300 MG/ML  SOLN COMPARISON:  12/28/2017 FINDINGS: Lower chest: Basilar airspace disease with nodular features in the dependent LEFT chest. Small effusions bilaterally. Heart is incompletely imaged without pericardial effusion. Signs of coronary artery calcification. Hepatobiliary: Liver without focal lesion. No pericholecystic stranding. The portal vein is patent. Hepatic veins are patent. Pancreas: Pancreas without ductal dilation or sign of inflammation. Spleen: Spleen normal in size and contour. Adrenals/Urinary Tract: Adrenal glands are normal. Malrotated, ptotic RIGHT kidney. Symmetric renal enhancement. Small cysts in the bilateral kidneys. Interval decrease in size and slight increase in density of an interpolar RIGHT renal cyst. Just at 30 Hounsfield units on portal venous phase. No hydronephrosis. Urinary bladder mildly distended.  No perivesical stranding. Stomach/Bowel: Stomach under distended limiting assessment. No adjacent stranding. No acute small bowel process. Colonic diverticulosis. Question mild thickening versus under distension of the ascending through the mid transverse colon. The appendix is normal. Vascular/Lymphatic: Calcified and noncalcified atheromatous plaque of the abdominal aorta. No aneurysmal dilation. There is no gastrohepatic or hepatoduodenal ligament lymphadenopathy. No retroperitoneal or  mesenteric lymphadenopathy. Top-normal sized periportal lymph nodes may be reactive Reproductive: Post hysterectomy without adnexal mass. No pelvic adenopathy Other: No ascites.  Small fat containing umbilical hernia. Musculoskeletal: Post ORIF RIGHT proximal femur partially imaged. Osteopenia. Spinal degenerative changes. IMPRESSION: 1. Basilar airspace disease with nodular features in the dependent LEFT chest, concerning for pneumonia or aspiration related changes. Follow-up to ensure resolution is suggested. 2. Trace bilateral pleural effusions associated with airspace disease. 3. Query mild thickening of the transverse colon. Also the ascending colon without significant surrounding stranding correlate with any signs of colitis though this is likely due to under distension. There is pancolonic diverticulosis worse in the sigmoid colon without overt signs of inflammation. 4. Aortic atherosclerosis. Aortic Atherosclerosis (ICD10-I70.0). Electronically Signed   By: Zetta Bills M.D.   On: 12/23/2019 10:44   DG Chest Port 1 View  Result Date: 12/23/2019 CLINICAL DATA:  Sepsis. EXAM: PORTABLE CHEST 1 VIEW COMPARISON:  June 11, 2019. FINDINGS: The heart size and mediastinal contours are within normal limits. No pneumothorax or pleural effusion is noted. Stable elevated left hemidiaphragm is noted. Minimal bibasilar subsegmental atelectasis is noted. The visualized skeletal structures are unremarkable. IMPRESSION: Minimal bibasilar subsegmental atelectasis. Electronically Signed   By: Marijo Conception M.D.   On: 12/23/2019 08:07     Scheduled Meds: . acetaminophen  1,000 mg Oral BID  . apixaban  5 mg Oral BID  . azithromycin  500 mg Oral Daily  . cholecalciferol  2,000 Units Oral Daily  . fluticasone  1 spray Each Nare Daily  . guaiFENesin  600 mg Oral BID  . insulin aspart  0-15 Units Subcutaneous TID WC  . multivitamin with minerals  1 tablet Oral Daily  . simvastatin  20 mg Oral QHS   Continuous  Infusions: . cefTRIAXone (ROCEPHIN)  IV Stopped (12/24/19 1401)     LOS: 1 day     Enzo Bi, MD [facility] If 7PM-7AM, please contact night-coverage 12/24/2019, 11:45 PM

## 2019-12-24 NOTE — Plan of Care (Signed)
Discussed with patient plan of care for the evening, pain management and certain snacks with some teach back displayed.  Peanut butter snack crackers and bottle water brought to patient by request

## 2019-12-24 NOTE — Progress Notes (Signed)
Daughter Neoma Laming called before heading on the trip with family the patient was supposed to attend.  Family member will be visiting one a day to check on the patient.  Gave update on vital signs and sleep last night

## 2019-12-24 NOTE — Plan of Care (Signed)
Discussed with patient in front of daughter plan of care for the evening, pain management and visitation rules with some teach back displayed

## 2019-12-25 DIAGNOSIS — J9601 Acute respiratory failure with hypoxia: Secondary | ICD-10-CM | POA: Diagnosis not present

## 2019-12-25 DIAGNOSIS — R6520 Severe sepsis without septic shock: Secondary | ICD-10-CM | POA: Diagnosis not present

## 2019-12-25 DIAGNOSIS — A419 Sepsis, unspecified organism: Secondary | ICD-10-CM | POA: Diagnosis not present

## 2019-12-25 LAB — BASIC METABOLIC PANEL
Anion gap: 7 (ref 5–15)
BUN: 22 mg/dL (ref 8–23)
CO2: 26 mmol/L (ref 22–32)
Calcium: 8.2 mg/dL — ABNORMAL LOW (ref 8.9–10.3)
Chloride: 108 mmol/L (ref 98–111)
Creatinine, Ser: 0.78 mg/dL (ref 0.44–1.00)
GFR, Estimated: >60 mL/min (ref >60)
Glucose, Bld: 199 mg/dL — ABNORMAL HIGH (ref 70–99)
Potassium: 3.6 mmol/L (ref 3.5–5.1)
Sodium: 141 mmol/L (ref 135–145)

## 2019-12-25 LAB — CBC
HCT: 28.4 % — ABNORMAL LOW (ref 36.0–46.0)
Hemoglobin: 9.3 g/dL — ABNORMAL LOW (ref 12.0–15.0)
MCH: 29.6 pg (ref 26.0–34.0)
MCHC: 32.7 g/dL (ref 30.0–36.0)
MCV: 90.4 fL (ref 80.0–100.0)
Platelets: 200 10*3/uL (ref 150–400)
RBC: 3.14 MIL/uL — ABNORMAL LOW (ref 3.87–5.11)
RDW: 12.7 % (ref 11.5–15.5)
WBC: 7.7 10*3/uL (ref 4.0–10.5)
nRBC: 0 % (ref 0.0–0.2)

## 2019-12-25 LAB — GLUCOSE, CAPILLARY
Glucose-Capillary: 221 mg/dL — ABNORMAL HIGH (ref 70–99)
Glucose-Capillary: 186 mg/dL — ABNORMAL HIGH (ref 70–99)
Glucose-Capillary: 166 mg/dL — ABNORMAL HIGH (ref 70–99)
Glucose-Capillary: 177 mg/dL — ABNORMAL HIGH (ref 70–99)

## 2019-12-25 LAB — MAGNESIUM: Magnesium: 1.6 mg/dL — ABNORMAL LOW (ref 1.7–2.4)

## 2019-12-25 MED ORDER — MAGNESIUM SULFATE 2 GM/50ML IV SOLN
2.0000 g | Freq: Once | INTRAVENOUS | Status: AC
  Administered 2019-12-25: 2 g via INTRAVENOUS
  Filled 2019-12-25: qty 50

## 2019-12-25 MED ORDER — METOPROLOL SUCCINATE ER 25 MG PO TB24
25.0000 mg | ORAL_TABLET | Freq: Every day | ORAL | Status: DC
  Administered 2019-12-25 – 2019-12-27 (×3): 25 mg via ORAL
  Filled 2019-12-25 (×3): qty 1

## 2019-12-25 NOTE — Progress Notes (Signed)
Mobility Specialist - Progress Note   12/25/19 1400  Mobility  Range of Motion/Exercises All extremities (ankle pumps, slr, iso, crossover reach, arm raises, flexion)  Level of Assistance Standby assist, set-up cues, supervision of patient - no hands on  Assistive Device None  Distance Ambulated (ft) 0 ft  Mobility Response Tolerated well  Mobility performed by Mobility specialist  $Mobility charge 1 Mobility    Pre-mobility: 57 HR, 98% SpO2 Post-mobility: 87 HR, 96% SpO2   Pt was lying in bed upon arrival with daughter present in room. Pt agreed to session. Pt denied any pain, however could not determine whether she was experiencing nausea or fatigue. Pt looked over to daughter and asked, "Do you think I'm having nausea?". Pt was very engaging in conversation and able to focus on tasks at hand. Pt stated that she has limitations in R knee d/t previous medical procedures. Pt was able to perform supine exercises: straight leg raises, hip add/abd, hip isometrics, crossover reach, arm raises, and elbow flexion with SBA. VC and gestures were needed to demonstrate how to properly perform each exercise. Pt carried over commands well. Pt educated on the importance of performing these exercises in between sessions, pt showed understanding. Overall, pt tolerated session well. Pt was left in bed with all needs in reach and alarm set.    Kathee Delton Mobility Specialist 12/25/19, 3:03 PM

## 2019-12-25 NOTE — Evaluation (Signed)
Physical Therapy Evaluation Patient Details Name: [name] MRN: [id number] DOB: [birth year] Today's Date: 12/25/2019   History of Present Illness  Patient is a 86 y.o. female with medical history significant for diabetes mellitus on oral hypoglycemic agents, history of paroxysmal A. fib on anticoagulation therapy, history of diverticulitis, hypertension who was brought into the ER for evaluation of feeling unwell for several days, fever, fatigued and unable to tolerate oral intake, acute hypoxic respiratory failure with CAP.   Clinical Impression  Patient agreeable to PT and cooperative during session. Patient reports she lives at [facility] and ambulates with rollator at baseline. Patient needs Min A for sitting to supine for LE support. Min guard assistance provided with sit to stand transfers using rolling walker. Patient ambulates a short distance in room with rolling walker with Min guard assistance. Patient participated with LE strengthening exercises in standing and sitting position. Recommend PT to maximize independence and address functional limitations below. Anticipate patient will be able to return to previous living environment with physical therapy.      Follow Up Recommendations Home health PT;Other (comment);Supervision - Intermittent (patient anticipates return to [facility] with PT services )    Equipment Recommendations  None recommended by PT    Recommendations for Other Services       Precautions / Restrictions Precautions Precautions: Fall Restrictions Weight Bearing Restrictions: No      Mobility  Bed Mobility Overal bed mobility: Needs Assistance Bed Mobility: Supine to Sit;Sit to Supine     Supine to sit: Min guard Sit to supine: Min assist   General bed mobility comments: assistance for occasional LE support for return to bed   Transfers Overall transfer level: Needs assistance Equipment used: Rolling walker (2 wheeled) Transfers: Sit to/from  Stand Sit to Stand: Min guard         General transfer comment: Min guard for safety   Ambulation/Gait Ambulation/Gait assistance: Min guard Gait Distance (Feet): 2 Feet Assistive device: Rolling walker (2 wheeled)       General Gait Details: patient ambulated short distance in room due to fatigue. declined getting up to chair at this time and requesting to return to bed.   Stairs            Wheelchair Mobility    Modified Rankin (Stroke Patients Only)       Balance Overall balance assessment: Needs assistance Sitting-balance support: Feet supported Sitting balance-Leahy Scale: Good     Standing balance support: Bilateral upper extremity supported Standing balance-Leahy Scale: Fair Standing balance comment: patient using rolling walker for UE support, unable to reach outside base of support without assistance                              Pertinent Vitals/Pain Pain Assessment: No/denies pain    Home Living Family/patient expects to be discharged to:: Assisted living Living Arrangements: Alone Available Help at Discharge: Family;Available PRN/intermittently Type of Home: Apartment Home Access: Level entry     Home Layout: One level Home Equipment: Walker - 4 wheels;Shower seat      Prior Function Level of Independence: Independent with assistive device(s)         Comments: patient uses rollator for all ambulation. patient is independent with ADLs at baseline      Hand Dominance        Extremity/Trunk Assessment   Upper Extremity Assessment Upper Extremity Assessment: Overall WFL for tasks assessed  Lower Extremity Assessment Lower Extremity Assessment: Generalized weakness       Communication   Communication: No difficulties  Cognition Arousal/Alertness: Awake/alert Behavior During Therapy: WFL for tasks assessed/performed Overall Cognitive Status: Within Functional Limits for tasks assessed                                         General Comments      Exercises General Exercises - Lower Extremity Long Arc Quad: AROM;Strengthening;Both;10 reps;Seated Hip Flexion/Marching: AAROM;Strengthening;Both;10 reps;Seated Toe Raises: AROM;Strengthening;Both;10 reps;Standing (using rolling walker for UE support ) Other Exercises Other Exercises: verbal cues for exercise technique. no shortness of breath noted with exercises    Assessment/Plan    PT Assessment Patient needs continued PT services  PT Problem List Decreased strength;Decreased activity tolerance;Decreased balance;Decreased mobility;Decreased safety awareness       PT Treatment Interventions DME instruction;Gait training;Functional mobility training;Therapeutic exercise;Therapeutic activities;Balance training;Neuromuscular re-education;Patient/family education    PT Goals (Current goals can be found in the Care Plan section)  Acute Rehab PT Goals Patient Stated Goal: to return to [facility] and continue PT  PT Goal Formulation: With patient Time For Goal Achievement: 01/08/20 Potential to Achieve Goals: Good    Frequency Min 2X/week   Barriers to discharge        Co-evaluation               AM-PAC PT "6 Clicks" Mobility  Outcome Measure Help needed turning from your back to your side while in a flat bed without using bedrails?: A Little Help needed moving from lying on your back to sitting on the side of a flat bed without using bedrails?: A Little Help needed moving to and from a bed to a chair (including a wheelchair)?: A Little Help needed standing up from a chair using your arms (e.g., wheelchair or bedside chair)?: A Little Help needed to walk in hospital room?: A Little Help needed climbing 3-5 steps with a railing? : A Little 6 Click Score: 18    End of Session   Activity Tolerance: Patient tolerated treatment well Patient left: in bed;with call bell/phone within reach;with bed alarm set Nurse  Communication: Mobility status PT Visit Diagnosis: Muscle weakness (generalized) (M62.81);Unsteadiness on feet (R26.81)    Time: 1105-1150 PT Time Calculation (min) (ACUTE ONLY): 45 min   Charges:   PT Evaluation $PT Eval Moderate Complexity: 1 Mod PT Treatments $Therapeutic Exercise: 8-22 mins $Therapeutic Activity: 8-22 mins        Minna Merritts, PT, MPT   Percell Locus 12/25/2019, 3:07 PM

## 2019-12-25 NOTE — Progress Notes (Signed)
PROGRESS NOTE    SHANTICE MENGER  LSL:[id number] DOB: 20-Sep-1931 DOA: 12/23/2019 PCP: Margo Common, PA    Assessment & Plan:   Principal Problem:   Sepsis [facility]) Active Problems:   Essential hypertension   Diabetes mellitus, type 2 ([place])   Paroxysmal atrial fibrillation ([place])   Dehydration   Aspiration pneumonia ([place])   Acute respiratory failure ([place])   Pressure injury of skin    [name] is a 86 y.o. female with medical history significant for diabetes mellitus on oral hypoglycemic agents, history of paroxysmal A. fib on anticoagulation therapy, history of diverticulitis, hypertension who was brought into the ER for evaluation of feeling unwell for several days.  Patient complains of feeling very weak, fatigued and unable to tolerate any oral intake.  She was noted to be febrile in the field with a T-max of 102F and was hypoxic with room air pulse oximetry of 88% and this improved with oxygen supplementation at 2 L.   Sepsis (POA)  CAP As evidenced by fever with a T-max of 102 F, tachycardia, tachypnea, leukoytosis with a left shift.  CT showed basilar airspace disease.   Patient noted to have room air pulse oximeter of 88% and improved following 2L oxygen supplementation. --Pt received IV fluid and started on Unasyn for suspected aspiration pneumonia, which was switched to ceftriaxone and azithromycin.   --SLP eval, found no overt aspiration PLAN: --cont ceftriaxone and azithromycin  Acute hypoxic respiratory failure 2/2 CAP As evidenced by tachypnea and room air pulse oximetry of 88% on room air that improved with oxygen supplementation at 2 L.  Weaned to RA today PLAN: --cont tx for CAP  History of A. Fib  Acquired thrombophilia --home metop held initially due to low BP.  BP now trending up. PLAN: --resume home Toprol --continue Eliquis   Hypertension --home metoprolol and lisinopril held initially due to hypotension.  BP now trending  up PLAN: --resume home Toprol  Diabetes mellitus Hold home oral anti-glycemics agents SSI   DVT prophylaxis: Lovenox SQ Code Status: DNR  Family Communication: daughter updated at the bedside Status is: inpatient Dispo:   The patient is from: home Anticipated d/c is to: home Anticipated d/c date is: 1-2 days Patient currently is not medically stable to d/c due to: CAP, treating with IV abx since pt presented with sepsis and hypoxia.   Subjective and Interval History:  Pt reported feeling better, but more coughing.  Off of O2 today.  Eating.  Order in to transfer out of progressive unit.   Objective: Vitals:   12/25/19 0531 12/25/19 0823 12/25/19 1203 12/25/19 1704  BP: 140/68 (!) 161/79 (!) 146/84 140/90  Pulse: 81 89 98 87  Resp: 18 18 16 18   Temp: 98 F (36.7 C) 98.7 F (37.1 C) 97.6 F (36.4 C) 97.6 F (36.4 C)  TempSrc: Oral Oral Oral Oral  SpO2: 93% 95% 96% 97%  Weight:      Height:        Intake/Output Summary (Last 24 hours) at 12/25/2019 1717 Last data filed at 12/25/2019 0230 Gross per 24 hour  Intake 180 ml  Output 550 ml  Net -370 ml   Filed Weights   12/24/19 0229 12/24/19 0413 12/25/19 0040  Weight: 73.8 kg 73.8 kg 74.7 kg    Examination:  Constitutional: NAD, AAOx3 HEENT: conjunctivae and lids normal, EOMI CV: No cyanosis.   RESP: No wheezing, on RA Extremities: No effusions, edema in BLE SKIN: warm, dry.  Thin  skin over BLE. Neuro: II - XII grossly intact.   Psych: Normal mood and affect.  Appropriate judgement and reason   Data Reviewed: I have personally reviewed following labs and imaging studies  CBC: Recent Labs  Lab 12/23/19 0740 12/24/19 0522 12/25/19 0426  WBC 12.9* 8.2 7.7  NEUTROABS 9.9*  --   --   HGB 12.0 9.3* 9.3*  HCT 35.2* 27.9* 28.4*  MCV 88.4 89.1 90.4  PLT 214 184 725   Basic Metabolic Panel: Recent Labs  Lab 12/23/19 0740 12/24/19 0522 12/25/19 0426  NA 135 139 141  K 3.7 3.6 3.6  CL 101 106  108  CO2 21* 24 26  GLUCOSE 277* 205* 199*  BUN 45* 35* 22  CREATININE 1.08* 1.03* 0.78  CALCIUM 8.8* 8.5* 8.2*  MG  --   --  1.6*   GFR: Estimated Creatinine Clearance: 47.3 mL/min (by C-G formula based on SCr of 0.78 mg/dL). Liver Function Tests: Recent Labs  Lab 12/23/19 0740  AST 32  ALT 71*  ALKPHOS 133*  BILITOT 1.4*  PROT 6.6  ALBUMIN 3.2*   No results for input(s): LIPASE, AMYLASE in the last 168 hours. No results for input(s): AMMONIA in the last 168 hours. Coagulation Profile: Recent Labs  Lab 12/23/19 0815 12/24/19 0522  INR 2.0* 2.1*   Cardiac Enzymes: No results for input(s): CKTOTAL, CKMB, CKMBINDEX, TROPONINI in the last 168 hours. BNP (last 3 results) No results for input(s): PROBNP in the last 8760 hours. HbA1C: No results for input(s): HGBA1C in the last 72 hours. CBG: Recent Labs  Lab 12/24/19 1608 12/24/19 2106 12/25/19 0826 12/25/19 1205 12/25/19 1706  GLUCAP 122* 208* 221* 186* 166*   Lipid Profile: No results for input(s): CHOL, HDL, LDLCALC, TRIG, CHOLHDL, LDLDIRECT in the last 72 hours. Thyroid Function Tests: No results for input(s): TSH, T4TOTAL, FREET4, T3FREE, THYROIDAB in the last 72 hours. Anemia Panel: No results for input(s): VITAMINB12, FOLATE, FERRITIN, TIBC, IRON, RETICCTPCT in the last 72 hours. Sepsis Labs: Recent Labs  Lab 12/23/19 0740 12/24/19 0522  PROCALCITON  --  5.71  LATICACIDVEN 1.5  --     Recent Results (from the past 240 hour(s))  Blood Culture (routine x 2)     Status: None (Preliminary result)   Collection Time: 12/23/19  7:40 AM   Specimen: BLOOD  Result Value Ref Range Status   Specimen Description BLOOD BLOOD LEFT FOREARM  Final   Special Requests   Final    BOTTLES DRAWN AEROBIC AND ANAEROBIC Blood Culture adequate volume   Culture   Final    NO GROWTH 2 DAYS Performed at [facility], [address], [place], [place] [address]    Report Status PENDING  Incomplete  Urine culture      Status: None   Collection Time: 12/23/19  7:40 AM   Specimen: In/Out Cath Urine  Result Value Ref Range Status   Specimen Description   Final    IN/OUT CATH URINE Performed at [facility], [address], [place], [place] [address]    Special Requests   Final    NONE Performed at [facility], [address] [place], [place] [address]    Culture   Final    NO GROWTH Performed at [facility], [place] [address], [place], [place] [address]    Report Status 12/24/2019 FINAL  Final  Respiratory Panel by RT PCR (Flu A&B, Covid) - Nasopharyngeal Swab     Status: None  Collection Time: 12/23/19  7:40 AM   Specimen: Nasopharyngeal Swab  Result Value Ref Range Status   SARS Coronavirus 2 by RT PCR NEGATIVE NEGATIVE Final    Comment: (NOTE) SARS-CoV-2 target nucleic acids are NOT DETECTED.  The SARS-CoV-2 RNA is generally detectable in upper respiratoy specimens during the acute phase of infection. The lowest concentration of SARS-CoV-2 viral copies this assay can detect is 131 copies/mL. A negative result does not preclude SARS-Cov-2 infection and should not be used as the sole basis for treatment or other patient management decisions. A negative result may occur with  improper specimen collection/handling, submission of specimen other than nasopharyngeal swab, presence of viral mutation(s) within the areas targeted by this assay, and inadequate number of viral copies (<131 copies/mL). A negative result must be combined with clinical observations, patient history, and epidemiological information. The expected result is Negative.  Fact Sheet for Patients:  PinkCheek.be  Fact Sheet for Healthcare Providers:  GravelBags.it  This test is no t yet approved or cleared by the [place] FDA and  has been authorized for detection and/or diagnosis of SARS-CoV-2 by FDA under an Emergency Use  Authorization (EUA). This EUA will remain  in effect (meaning this test can be used) for the duration of the COVID-19 declaration under Section 564(b)(1) of the Act, 21 U.S.C. section 360bbb-3(b)(1), unless the authorization is terminated or revoked sooner.     Influenza A by PCR NEGATIVE NEGATIVE Final   Influenza B by PCR NEGATIVE NEGATIVE Final    Comment: (NOTE) The Xpert Xpress SARS-CoV-2/FLU/RSV assay is intended as an aid in  the diagnosis of influenza from Nasopharyngeal swab specimens and  should not be used as a sole basis for treatment. Nasal washings and  aspirates are unacceptable for Xpert Xpress SARS-CoV-2/FLU/RSV  testing.  Fact Sheet for Patients: PinkCheek.be  Fact Sheet for Healthcare Providers: GravelBags.it  This test is not yet approved or cleared by the [place] FDA and  has been authorized for detection and/or diagnosis of SARS-CoV-2 by  FDA under an Emergency Use Authorization (EUA). This EUA will remain  in effect (meaning this test can be used) for the duration of the  Covid-19 declaration under Section 564(b)(1) of the Act, 21  U.S.C. section 360bbb-3(b)(1), unless the authorization is  terminated or revoked. Performed at [facility], [address], [place], [place] [address]   Blood Culture (routine x 2)     Status: None (Preliminary result)   Collection Time: 12/23/19  8:15 AM   Specimen: BLOOD  Result Value Ref Range Status   Specimen Description BLOOD BLOOD LEFT HAND  Final   Special Requests   Final    BOTTLES DRAWN AEROBIC AND ANAEROBIC Blood Culture results may not be optimal due to an inadequate volume of blood received in culture bottles   Culture   Final    NO GROWTH 2 DAYS Performed at [facility], [address], [place], [place] [address]    Report Status PENDING  Incomplete  MRSA PCR Screening     Status: None   Collection Time: 12/24/19  5:30 PM    Specimen: Nasopharyngeal  Result Value Ref Range Status   MRSA by PCR NEGATIVE NEGATIVE Final    Comment:        The GeneXpert MRSA Assay (FDA approved for NASAL specimens only), is one component of a comprehensive MRSA colonization surveillance program. It is not intended to diagnose MRSA infection nor to guide or monitor treatment for MRSA  infections. Performed at [facility], [address], [place], [place] [address]       Radiology Studies: No results found.   Scheduled Meds: . acetaminophen  1,000 mg Oral BID  . apixaban  5 mg Oral BID  . azithromycin  500 mg Oral Daily  . cholecalciferol  2,000 Units Oral Daily  . fluticasone  1 spray Each Nare Daily  . guaiFENesin  600 mg Oral BID  . insulin aspart  0-15 Units Subcutaneous TID WC  . metoprolol succinate  25 mg Oral Daily  . multivitamin with minerals  1 tablet Oral Daily  . simvastatin  20 mg Oral QHS   Continuous Infusions: . cefTRIAXone (ROCEPHIN)  IV 1 g (12/25/19 1330)     LOS: 2 days     Enzo Bi, MD [facility] If 7PM-7AM, please contact night-coverage 12/25/2019, 5:17 PM

## 2019-12-26 DIAGNOSIS — A419 Sepsis, unspecified organism: Secondary | ICD-10-CM | POA: Diagnosis not present

## 2019-12-26 DIAGNOSIS — J9601 Acute respiratory failure with hypoxia: Secondary | ICD-10-CM | POA: Diagnosis not present

## 2019-12-26 DIAGNOSIS — R6520 Severe sepsis without septic shock: Secondary | ICD-10-CM | POA: Diagnosis not present

## 2019-12-26 LAB — CBC
HCT: 30.5 % — ABNORMAL LOW (ref 36.0–46.0)
Hemoglobin: 10.2 g/dL — ABNORMAL LOW (ref 12.0–15.0)
MCH: 30.1 pg (ref 26.0–34.0)
MCHC: 33.4 g/dL (ref 30.0–36.0)
MCV: 90 fL (ref 80.0–100.0)
Platelets: 234 10*3/uL (ref 150–400)
RBC: 3.39 MIL/uL — ABNORMAL LOW (ref 3.87–5.11)
RDW: 12.7 % (ref 11.5–15.5)
WBC: 7.9 10*3/uL (ref 4.0–10.5)
nRBC: 0 % (ref 0.0–0.2)

## 2019-12-26 LAB — GLUCOSE, CAPILLARY
Glucose-Capillary: 214 mg/dL — ABNORMAL HIGH (ref 70–99)
Glucose-Capillary: 194 mg/dL — ABNORMAL HIGH (ref 70–99)
Glucose-Capillary: 148 mg/dL — ABNORMAL HIGH (ref 70–99)
Glucose-Capillary: 159 mg/dL — ABNORMAL HIGH (ref 70–99)

## 2019-12-26 LAB — BASIC METABOLIC PANEL
Anion gap: 9 (ref 5–15)
BUN: 14 mg/dL (ref 8–23)
CO2: 25 mmol/L (ref 22–32)
Calcium: 8.5 mg/dL — ABNORMAL LOW (ref 8.9–10.3)
Chloride: 107 mmol/L (ref 98–111)
Creatinine, Ser: 0.73 mg/dL (ref 0.44–1.00)
GFR, Estimated: >60 mL/min (ref >60)
Glucose, Bld: 198 mg/dL — ABNORMAL HIGH (ref 70–99)
Potassium: 3.6 mmol/L (ref 3.5–5.1)
Sodium: 141 mmol/L (ref 135–145)

## 2019-12-26 LAB — MAGNESIUM: Magnesium: 1.7 mg/dL (ref 1.7–2.4)

## 2019-12-26 MED ORDER — LOPERAMIDE HCL 2 MG PO CAPS
2.0000 mg | ORAL_CAPSULE | Freq: Two times a day (BID) | ORAL | Status: DC | PRN
  Administered 2019-12-26: 2 mg via ORAL
  Filled 2019-12-26: qty 1

## 2019-12-26 MED ORDER — LISINOPRIL 20 MG PO TABS
20.0000 mg | ORAL_TABLET | Freq: Every day | ORAL | Status: DC
  Administered 2019-12-27: 20 mg via ORAL
  Filled 2019-12-26: qty 1

## 2019-12-26 NOTE — Care Management Important Message (Signed)
Important Message  Patient Details  Name: [name] MRN: [id number] Date of Birth: [birth year]   Medicare Important Message Given:  Yes     Dannette Barbara 12/26/2019, 3:28 PM

## 2019-12-26 NOTE — Progress Notes (Signed)
Mobility Specialist - Progress Note   12/26/19 1329  Mobility  Activity Transferred:  Bed to chair  Level of Assistance Standby assist, set-up cues, supervision of patient - no hands on (CGA for safety)  Assistive Device Front wheel walker  Distance Ambulated (ft) 5 ft  Mobility Response Tolerated well  Mobility performed by Mobility specialist  $Mobility charge 1 Mobility   Pt laying in bed w/ Dr present in room upon arrival. Pt agreed to session. Pt transitioned supine to sit mod. Independently. Pt transferred from bed to chair w/ SBA. CGA utilized for safety. No LOB noted. Overall, pt tolerated session well. Nurse entered the room. Pt left sitting on chair w/ nurse present in room. Call bell and phone placed in reach.     [name] Mobility Specialist  12/26/19, 1:39 PM

## 2019-12-26 NOTE — Progress Notes (Signed)
Inpatient Diabetes Program Recommendations  AACE/ADA: New Consensus Statement on Inpatient Glycemic Control (2015)  Target Ranges:  Prepandial:   less than 140 mg/dL      Peak postprandial:   less than 180 mg/dL (1-2 hours)      Critically ill patients:  140 - 180 mg/dL   Results for [name], [name] (MRN [id number]) as of 12/26/2019 10:20  Ref. Range 12/25/2019 08:26 12/25/2019 12:05 12/25/2019 17:06 12/25/2019 21:36  Glucose-Capillary Latest Ref Range: 70 - 99 mg/dL 221 (H)  5 units NOVOLOG  186 (H)  3 units NOVOLOG  166 (H)  3 units NOVOLOG  177 (H)   Results for JASLEEN, RIEPE (MRN [id number]) as of 12/26/2019 10:20  Ref. Range 12/26/2019 08:10  Glucose-Capillary Latest Ref Range: 70 - 99 mg/dL 214 (H)  5 units NOVOLOG       Home DM Meds: Glipizide 5 mg Daily       Metformin 500 mg BID  Current Orders: Novolog Moderate Correction Scale/ SSI (0-15 units) TID AC     MD- Note AM CBGs have been >200 the last 2 days.  Home oral DM meds are on hold.  Please consider adding low dose basal insulin: Levemir 5 units Daily (~0.05 units/kg)    --Will follow patient during hospitalization--  Wyn Quaker RN, MSN, CDE Diabetes Coordinator Inpatient Glycemic Control Team Team Pager: [phone] (8a-5p)

## 2019-12-26 NOTE — Progress Notes (Signed)
PROGRESS NOTE    [name]  TKW:[id number] DOB: 1931-10-16 DOA: 12/23/2019 PCP: Margo Common, PA    Assessment & Plan:   Principal Problem:   Sepsis [facility]) Active Problems:   Essential hypertension   Diabetes mellitus, type 2 ([place])   Paroxysmal atrial fibrillation ([place])   Dehydration   Aspiration pneumonia ([place])   Acute respiratory failure ([place])   Pressure injury of skin    [name] is a 86 y.o. female with medical history significant for diabetes mellitus on oral hypoglycemic agents, history of paroxysmal A. fib on anticoagulation therapy, history of diverticulitis, hypertension who was brought into the ER for evaluation of feeling unwell for several days.  Patient complains of feeling very weak, fatigued and unable to tolerate any oral intake.  She was noted to be febrile in the field with a T-max of 102F and was hypoxic with room air pulse oximetry of 88% and this improved with oxygen supplementation at 2 L.   Sepsis (POA)  CAP As evidenced by fever with a T-max of 102 F, tachycardia, tachypnea, leukoytosis with a left shift.  CT showed basilar airspace disease.   Patient noted to have room air pulse oximeter of 88% and improved following 2L oxygen supplementation.   --Pt received IV fluid and started on Unasyn for suspected aspiration pneumonia, which was switched to ceftriaxone and azithromycin.   --SLP eval, found no overt aspiration PLAN: --cont ceftriaxone and azithromycin for 5 days  Acute hypoxic respiratory failure 2/2 CAP As evidenced by tachypnea and room air pulse oximetry of 88% on room air that improved with oxygen supplementation at 2 L.  Weaned to RA on 10/17. PLAN: --cont tx for CAP for a full course   History of A. Fib  Acquired thrombophilia --home metop held initially due to low BP.  BP now trending up. PLAN: --cont home Toprol --continue Eliquis   Hypertension --home metoprolol and lisinopril held initially due to hypotension.   BP now trending up.   PLAN: --cont home Toprol --resume home Lisinopril   Diabetes mellitus Hold home oral anti-glycemics agents SSI   DVT prophylaxis: Lovenox SQ Code Status: DNR  Family Communication:  Status is: inpatient Dispo:   The patient is from: home Anticipated d/c is to: home Anticipated d/c date is: tomorrow Patient currently is not medically stable to d/c due to: CAP, treating with IV abx for a full course since pt presented with sepsis and hypoxia.   Subjective and Interval History:  Pt reported feeling tired.  Coughing less.  Sating well on room air.   Objective: Vitals:   12/25/19 2359 12/26/19 0424 12/26/19 0808 12/26/19 1546  BP: (!) 142/84 (!) 141/69 (!) 157/78 (!) 145/97  Pulse: 86 82 84 79  Resp:   17 17  Temp: 98.8 F (37.1 C) 98.7 F (37.1 C) 98.5 F (36.9 C) 98 F (36.7 C)  TempSrc: Oral Oral Oral Oral  SpO2: 96% 95% 96% 95%  Weight:  72.2 kg    Height:        Intake/Output Summary (Last 24 hours) at 12/26/2019 1721 Last data filed at 12/26/2019 1549 Gross per 24 hour  Intake 720 ml  Output 1200 ml  Net -480 ml   Filed Weights   12/24/19 0413 12/25/19 0040 12/26/19 0424  Weight: 73.8 kg 74.7 kg 72.2 kg    Examination:  Constitutional: NAD, AAOx3 HEENT: conjunctivae and lids normal, EOMI CV: No cyanosis.   RESP: clear over anterior, normal respiratory effort,  on RA Extremities: No effusions, edema in BLE SKIN: warm, dry and intact Neuro: II - XII grossly intact.   Psych: Normal mood and affect.  Appropriate judgement and reason    Data Reviewed: I have personally reviewed following labs and imaging studies  CBC: Recent Labs  Lab 12/23/19 0740 12/24/19 0522 12/25/19 0426 12/26/19 0306  WBC 12.9* 8.2 7.7 7.9  NEUTROABS 9.9*  --   --   --   HGB 12.0 9.3* 9.3* 10.2*  HCT 35.2* 27.9* 28.4* 30.5*  MCV 88.4 89.1 90.4 90.0  PLT 214 184 200 427   Basic Metabolic Panel: Recent Labs  Lab 12/23/19 0740 12/24/19 0522  12/25/19 0426 12/26/19 0306  NA 135 139 141 141  K 3.7 3.6 3.6 3.6  CL 101 106 108 107  CO2 21* 24 26 25   GLUCOSE 277* 205* 199* 198*  BUN 45* 35* 22 14  CREATININE 1.08* 1.03* 0.78 0.73  CALCIUM 8.8* 8.5* 8.2* 8.5*  MG  --   --  1.6* 1.7   GFR: Estimated Creatinine Clearance: 46.5 mL/min (by C-G formula based on SCr of 0.73 mg/dL). Liver Function Tests: Recent Labs  Lab 12/23/19 0740  AST 32  ALT 71*  ALKPHOS 133*  BILITOT 1.4*  PROT 6.6  ALBUMIN 3.2*   No results for input(s): LIPASE, AMYLASE in the last 168 hours. No results for input(s): AMMONIA in the last 168 hours. Coagulation Profile: Recent Labs  Lab 12/23/19 0815 12/24/19 0522  INR 2.0* 2.1*   Cardiac Enzymes: No results for input(s): CKTOTAL, CKMB, CKMBINDEX, TROPONINI in the last 168 hours. BNP (last 3 results) No results for input(s): PROBNP in the last 8760 hours. HbA1C: No results for input(s): HGBA1C in the last 72 hours. CBG: Recent Labs  Lab 12/25/19 1706 12/25/19 2136 12/26/19 0810 12/26/19 1126 12/26/19 1556  GLUCAP 166* 177* 214* 194* 148*   Lipid Profile: No results for input(s): CHOL, HDL, LDLCALC, TRIG, CHOLHDL, LDLDIRECT in the last 72 hours. Thyroid Function Tests: No results for input(s): TSH, T4TOTAL, FREET4, T3FREE, THYROIDAB in the last 72 hours. Anemia Panel: No results for input(s): VITAMINB12, FOLATE, FERRITIN, TIBC, IRON, RETICCTPCT in the last 72 hours. Sepsis Labs: Recent Labs  Lab 12/23/19 0740 12/24/19 0522  PROCALCITON  --  5.71  LATICACIDVEN 1.5  --     Recent Results (from the past 240 hour(s))  Blood Culture (routine x 2)     Status: None (Preliminary result)   Collection Time: 12/23/19  7:40 AM   Specimen: BLOOD  Result Value Ref Range Status   Specimen Description BLOOD BLOOD LEFT FOREARM  Final   Special Requests   Final    BOTTLES DRAWN AEROBIC AND ANAEROBIC Blood Culture adequate volume   Culture   Final    NO GROWTH 3 DAYS Performed at [facility], [address] [place], [place] [address]    Report Status PENDING  Incomplete  Urine culture     Status: None   Collection Time: 12/23/19  7:40 AM   Specimen: In/Out Cath Urine  Result Value Ref Range Status   Specimen Description   Final    IN/OUT CATH URINE Performed at [facility], [address], [place], [place] [address]    Special Requests   Final    NONE Performed at [facility], [address], [place], [place] [address]    Culture   Final    NO GROWTH Performed at [facility], [place]  [address], [place], [place] [address]    Report Status 12/24/2019 FINAL  Final  Respiratory Panel by RT PCR (Flu A&B, Covid) - Nasopharyngeal Swab     Status: None   Collection Time: 12/23/19  7:40 AM   Specimen: Nasopharyngeal Swab  Result Value Ref Range Status   SARS Coronavirus 2 by RT PCR NEGATIVE NEGATIVE Final    Comment: (NOTE) SARS-CoV-2 target nucleic acids are NOT DETECTED.  The SARS-CoV-2 RNA is generally detectable in upper respiratoy specimens during the acute phase of infection. The lowest concentration of SARS-CoV-2 viral copies this assay can detect is 131 copies/mL. A negative result does not preclude SARS-Cov-2 infection and should not be used as the sole basis for treatment or other patient management decisions. A negative result may occur with  improper specimen collection/handling, submission of specimen other than nasopharyngeal swab, presence of viral mutation(s) within the areas targeted by this assay, and inadequate number of viral copies (<131 copies/mL). A negative result must be combined with clinical observations, patient history, and epidemiological information. The expected result is Negative.  Fact Sheet for Patients:  PinkCheek.be  Fact Sheet for Healthcare Providers:  GravelBags.it  This test is no t yet approved or cleared by the [place]  FDA and  has been authorized for detection and/or diagnosis of SARS-CoV-2 by FDA under an Emergency Use Authorization (EUA). This EUA will remain  in effect (meaning this test can be used) for the duration of the COVID-19 declaration under Section 564(b)(1) of the Act, 21 U.S.C. section 360bbb-3(b)(1), unless the authorization is terminated or revoked sooner.     Influenza A by PCR NEGATIVE NEGATIVE Final   Influenza B by PCR NEGATIVE NEGATIVE Final    Comment: (NOTE) The Xpert Xpress SARS-CoV-2/FLU/RSV assay is intended as an aid in  the diagnosis of influenza from Nasopharyngeal swab specimens and  should not be used as a sole basis for treatment. Nasal washings and  aspirates are unacceptable for Xpert Xpress SARS-CoV-2/FLU/RSV  testing.  Fact Sheet for Patients: PinkCheek.be  Fact Sheet for Healthcare Providers: GravelBags.it  This test is not yet approved or cleared by the [place] FDA and  has been authorized for detection and/or diagnosis of SARS-CoV-2 by  FDA under an Emergency Use Authorization (EUA). This EUA will remain  in effect (meaning this test can be used) for the duration of the  Covid-19 declaration under Section 564(b)(1) of the Act, 21  U.S.C. section 360bbb-3(b)(1), unless the authorization is  terminated or revoked. Performed at [facility], [address] [place], [place] [address]   Blood Culture (routine x 2)     Status: None (Preliminary result)   Collection Time: 12/23/19  8:15 AM   Specimen: BLOOD  Result Value Ref Range Status   Specimen Description BLOOD BLOOD LEFT HAND  Final   Special Requests   Final    BOTTLES DRAWN AEROBIC AND ANAEROBIC Blood Culture results may not be optimal due to an inadequate volume of blood received in culture bottles   Culture   Final    NO GROWTH 3 DAYS Performed at [facility], [address] [place], [place] [address]     Report Status PENDING  Incomplete  MRSA PCR Screening     Status: None   Collection Time: 12/24/19  5:30 PM   Specimen: Nasopharyngeal  Result Value Ref Range Status   MRSA by PCR NEGATIVE NEGATIVE Final    Comment:        The GeneXpert  MRSA Assay (FDA approved for NASAL specimens only), is one component of a comprehensive MRSA colonization surveillance program. It is not intended to diagnose MRSA infection nor to guide or monitor treatment for MRSA infections. Performed at [facility], [address], [place], [place] [address]       Radiology Studies: No results found.   Scheduled Meds: . acetaminophen  1,000 mg Oral BID  . apixaban  5 mg Oral BID  . azithromycin  500 mg Oral Daily  . cholecalciferol  2,000 Units Oral Daily  . fluticasone  1 spray Each Nare Daily  . guaiFENesin  600 mg Oral BID  . insulin aspart  0-15 Units Subcutaneous TID WC  . metoprolol succinate  25 mg Oral Daily  . multivitamin with minerals  1 tablet Oral Daily  . simvastatin  20 mg Oral QHS   Continuous Infusions: . cefTRIAXone (ROCEPHIN)  IV 1 g (12/26/19 1311)     LOS: 3 days     Enzo Bi, MD [facility] If 7PM-7AM, please contact night-coverage 12/26/2019, 5:21 PM

## 2019-12-26 NOTE — Plan of Care (Signed)

## 2019-12-27 DIAGNOSIS — A419 Sepsis, unspecified organism: Secondary | ICD-10-CM | POA: Diagnosis not present

## 2019-12-27 DIAGNOSIS — J9601 Acute respiratory failure with hypoxia: Secondary | ICD-10-CM | POA: Diagnosis not present

## 2019-12-27 DIAGNOSIS — R6520 Severe sepsis without septic shock: Secondary | ICD-10-CM | POA: Diagnosis not present

## 2019-12-27 LAB — MAGNESIUM: Magnesium: 1.5 mg/dL — ABNORMAL LOW (ref 1.7–2.4)

## 2019-12-27 LAB — BASIC METABOLIC PANEL
Anion gap: 10 (ref 5–15)
BUN: 13 mg/dL (ref 8–23)
CO2: 24 mmol/L (ref 22–32)
Calcium: 9 mg/dL (ref 8.9–10.3)
Chloride: 106 mmol/L (ref 98–111)
Creatinine, Ser: 0.81 mg/dL (ref 0.44–1.00)
GFR, Estimated: >60 mL/min (ref >60)
Glucose, Bld: 206 mg/dL — ABNORMAL HIGH (ref 70–99)
Potassium: 3.8 mmol/L (ref 3.5–5.1)
Sodium: 140 mmol/L (ref 135–145)

## 2019-12-27 LAB — CBC
HCT: 31 % — ABNORMAL LOW (ref 36.0–46.0)
Hemoglobin: 10.2 g/dL — ABNORMAL LOW (ref 12.0–15.0)
MCH: 29.7 pg (ref 26.0–34.0)
MCHC: 32.9 g/dL (ref 30.0–36.0)
MCV: 90.4 fL (ref 80.0–100.0)
Platelets: 238 10*3/uL (ref 150–400)
RBC: 3.43 MIL/uL — ABNORMAL LOW (ref 3.87–5.11)
RDW: 13 % (ref 11.5–15.5)
WBC: 7 10*3/uL (ref 4.0–10.5)
nRBC: 0 % (ref 0.0–0.2)

## 2019-12-27 LAB — GLUCOSE, CAPILLARY
Glucose-Capillary: 199 mg/dL — ABNORMAL HIGH (ref 70–99)
Glucose-Capillary: 214 mg/dL — ABNORMAL HIGH (ref 70–99)

## 2019-12-27 MED ORDER — SODIUM CHLORIDE 0.9 % IV SOLN
1.0000 g | INTRAVENOUS | Status: DC
  Administered 2019-12-27: 1 g via INTRAVENOUS
  Filled 2019-12-27: qty 1

## 2019-12-27 MED ORDER — SODIUM CHLORIDE 0.9 % IV SOLN
INTRAVENOUS | Status: DC | PRN
  Administered 2019-12-27: 500 mL via INTRAVENOUS

## 2019-12-27 MED ORDER — ACETAMINOPHEN 500 MG PO TABS
1000.0000 mg | ORAL_TABLET | Freq: Two times a day (BID) | ORAL | 0 refills | Status: DC | PRN
Start: 2019-12-27 — End: 2021-07-26

## 2019-12-27 NOTE — Discharge Summary (Addendum)
Physician Discharge Summary   [name]  female DOB: 17-Apr-1931  JJK:[id number]  PCP: [name] Common, PA  Admit date: 12/23/2019 Discharge date: 12/27/2019  Admitted From: Assisted living Disposition:  Assisted living Home Health: Yes CODE STATUS: DNR  Discharge Instructions    Discharge instructions   Complete by: As directed    You have completed 5 days of IV antibiotic for your pneumonia.  You are now maintaining good oxygen level on room air.  So you can go home to continue to recover.   Dr. Enzo Bi - -   No wound care   Complete by: As directed        Hospital Course:  For full details, please see H&P, progress notes, consult notes and ancillary notes.  Briefly,  [name] Alguire Hilleris a 86 y.o.femalewith medical history significant fordiabetes mellitus on oral hypoglycemic agents, history of paroxysmal A. fib on anticoagulation therapy, history of diverticulitis, hypertension who was brought into the ER for evaluation of feeling unwell for several days. Patient complains of feeling very weak, fatigued and unable to tolerate any oral intake. She was noted to be febrile in the field with a T-max of 102Fand was hypoxic with room air pulse oximetry of 88% and this improved with oxygen supplementation at 2 L.   Sepsis(POA)  CAP As evidenced by fever with a T-max of102F,tachycardia,tachypnea,leukoytosis with a left shift.  CT showed basilar airspace disease.  Patient noted to have room air pulse oximeter of 88% and improved following 2L oxygen supplementation.  Pt received IV fluid and was started on Unasyn on admission for suspected aspiration pneumonia, which was switched to ceftriaxone and azithromycin next day for CAP tx.  SLP eval, found no overt aspiration.  Pt completed 5 days of ceftriaxone and azithromycin.  Pt was weaned down to room air prior to discharge.  Acute hypoxic respiratory failure 2/2 CAP As evidenced by tachypnea and room air pulse  oximetry of 88% on room air that improved with oxygen supplementation at 2 L.  Weaned to RA on 10/17.  Pt finished a full course of abx for CAP treatment.  History of A. Fib on Eliquis Acquired thrombophilia home metop held initially due to low BP, then resumed after BP started trending up.  continued Eliquis   Hypertension home metoprolol and lisinopril held initially due to hypotension.  Both resumed after BP started trending up.  Diabetes mellitus home oral anti-glycemics agents held while inpatient, which are resumed after discharge.  Pt received SSI while inpatient.  stage 1 sacral pressure injury POA   Discharge Diagnoses:  Principal Problem:   Sepsis ([place]) Active Problems:   Essential hypertension   Diabetes mellitus, type 2 (HCC)   Paroxysmal atrial fibrillation (HCC)   Dehydration   Aspiration pneumonia (HCC)   Acute respiratory failure (HCC)   Pressure injury of skin    Discharge Instructions:  Allergies as of 12/27/2019      Reactions   Morphine Sulfate Other (See Comments)   BP bottoms out   Penicillins Diarrhea   Has patient had a PCN reaction causing immediate rash, facial/tongue/throat swelling, SOB or lightheadedness with hypotension: Yes Has patient had a PCN reaction causing severe rash involving mucus membranes or skin necrosis: Yes Has patient had a PCN reaction that required hospitalization No Has patient had a PCN reaction occurring within the last 10 years: No If all of the above answers are "NO", then may proceed with Cephalosporin use.      Medication  List    TAKE these medications   acetaminophen 500 MG tablet Commonly known as: TYLENOL Take 2 tablets (1,000 mg total) by mouth 2 (two) times daily as needed (pain.). What changed:   when to take this  reasons to take this   apixaban 5 MG Tabs tablet Commonly known as: Eliquis Take 1 tablet (5 mg total) by mouth 2 (two) times daily.   Clobetasol Propionate 0.05 % shampoo 2- 3 times  per week lather on scalp, leave on 20 minutes, wash out.   Fluocinolone Acetonide Scalp 0.01 % Oil Apply to scalp in evening, cover with shower cap, wash off in morning. Use on evenings prior to shampooing next day.   furosemide 20 MG tablet Commonly known as: LASIX Take 1 tablet (20 mg total) by mouth daily. (only if needed).   glipiZIDE 5 MG 24 hr tablet Commonly known as: GLUCOTROL XL TALE 1 TABLET BY MOUTH ONCE DAILY WITH BREAKFAST   lisinopril 20 MG tablet Commonly known as: ZESTRIL Take 1 tablet (20 mg total) by mouth daily.   metFORMIN 500 MG tablet Commonly known as: GLUCOPHAGE TAKE 1 TABLET BY MOUTH TWICE DAILY WITH MEALS   metoprolol succinate 25 MG 24 hr tablet Commonly known as: TOPROL-XL TAKE 1 TABLET BY MOUTH ONCE DAILY   multivitamin with minerals Tabs tablet Take 1 tablet by mouth daily.   simvastatin 20 MG tablet Commonly known as: ZOCOR TAKE ONE TABLET BY MOUTH EVERY NIGHT AT BEDTIME   Vitamin D 50 MCG (2000 UT) Caps Take 2,000 Units by mouth daily.        Follow-up Information    Chrismon, [name], [place]. Schedule an appointment as soon as possible for a visit in 1 week(s).   Specialty: Family Medicine Contact information: [place] [address] [phone]        [name] Bush, MD .   Specialty: Cardiology Contact information: [address] [place] [address] [phone]               Allergies  Allergen Reactions  . Morphine Sulfate Other (See Comments)    BP bottoms out  . Penicillins Diarrhea    Has patient had a PCN reaction causing immediate rash, facial/tongue/throat swelling, SOB or lightheadedness with hypotension: Yes Has patient had a PCN reaction causing severe rash involving mucus membranes or skin necrosis: Yes Has patient had a PCN reaction that required hospitalization No Has patient had a PCN reaction occurring within the last 10 years: No If all of the above answers are  "NO", then may proceed with Cephalosporin use.     The results of significant diagnostics from this hospitalization (including imaging, microbiology, ancillary and laboratory) are listed below for reference.   Consultations:   Procedures/Studies: CT ABDOMEN PELVIS W CONTRAST  Result Date: 12/23/2019 CLINICAL DATA:  Abdominal infection, abscess suspected febrile and hypoxic on room air EXAM: CT ABDOMEN AND PELVIS WITH CONTRAST TECHNIQUE: Multidetector CT imaging of the abdomen and pelvis was performed using the standard protocol following bolus administration of intravenous contrast. CONTRAST:  20mL OMNIPAQUE IOHEXOL 300 MG/ML  SOLN COMPARISON:  12/28/2017 FINDINGS: Lower chest: Basilar airspace disease with nodular features in the dependent LEFT chest. Small effusions bilaterally. Heart is incompletely imaged without pericardial effusion. Signs of coronary artery calcification. Hepatobiliary: Liver without focal lesion. No pericholecystic stranding. The portal vein is patent. Hepatic veins are patent. Pancreas: Pancreas without ductal dilation or sign of inflammation. Spleen: Spleen normal in size and contour.  Adrenals/Urinary Tract: Adrenal glands are normal. Malrotated, ptotic RIGHT kidney. Symmetric renal enhancement. Small cysts in the bilateral kidneys. Interval decrease in size and slight increase in density of an interpolar RIGHT renal cyst. Just at 30 Hounsfield units on portal venous phase. No hydronephrosis. Urinary bladder mildly distended.  No perivesical stranding. Stomach/Bowel: Stomach under distended limiting assessment. No adjacent stranding. No acute small bowel process. Colonic diverticulosis. Question mild thickening versus under distension of the ascending through the mid transverse colon. The appendix is normal. Vascular/Lymphatic: Calcified and noncalcified atheromatous plaque of the abdominal aorta. No aneurysmal dilation. There is no gastrohepatic or hepatoduodenal ligament  lymphadenopathy. No retroperitoneal or mesenteric lymphadenopathy. Top-normal sized periportal lymph nodes may be reactive Reproductive: Post hysterectomy without adnexal mass. No pelvic adenopathy Other: No ascites.  Small fat containing umbilical hernia. Musculoskeletal: Post ORIF RIGHT proximal femur partially imaged. Osteopenia. Spinal degenerative changes. IMPRESSION: 1. Basilar airspace disease with nodular features in the dependent LEFT chest, concerning for pneumonia or aspiration related changes. Follow-up to ensure resolution is suggested. 2. Trace bilateral pleural effusions associated with airspace disease. 3. Query mild thickening of the transverse colon. Also the ascending colon without significant surrounding stranding correlate with any signs of colitis though this is likely due to under distension. There is pancolonic diverticulosis worse in the sigmoid colon without overt signs of inflammation. 4. Aortic atherosclerosis. Aortic Atherosclerosis (ICD10-I70.0). Electronically Signed   By: Zetta Bills M.D.   On: 12/23/2019 10:44   DG Chest Port 1 View  Result Date: 12/23/2019 CLINICAL DATA:  Sepsis. EXAM: PORTABLE CHEST 1 VIEW COMPARISON:  June 11, 2019. FINDINGS: The heart size and mediastinal contours are within normal limits. No pneumothorax or pleural effusion is noted. Stable elevated left hemidiaphragm is noted. Minimal bibasilar subsegmental atelectasis is noted. The visualized skeletal structures are unremarkable. IMPRESSION: Minimal bibasilar subsegmental atelectasis. Electronically Signed   By: Marijo Conception M.D.   On: 12/23/2019 08:07      Labs: BNP (last 3 results) No results for input(s): BNP in the last 8760 hours. Basic Metabolic Panel: Recent Labs  Lab 12/23/19 0740 12/24/19 0522 12/25/19 0426 12/26/19 0306 12/27/19 0608  NA 135 139 141 141 140  K 3.7 3.6 3.6 3.6 3.8  CL 101 106 108 107 106  CO2 21* 24 26 25 24   GLUCOSE 277* 205* 199* 198* 206*  BUN 45*  35* 22 14 13   CREATININE 1.08* 1.03* 0.78 0.73 0.81  CALCIUM 8.8* 8.5* 8.2* 8.5* 9.0  MG  --   --  1.6* 1.7 1.5*   Liver Function Tests: Recent Labs  Lab 12/23/19 0740  AST 32  ALT 71*  ALKPHOS 133*  BILITOT 1.4*  PROT 6.6  ALBUMIN 3.2*   No results for input(s): LIPASE, AMYLASE in the last 168 hours. No results for input(s): AMMONIA in the last 168 hours. CBC: Recent Labs  Lab 12/23/19 0740 12/24/19 0522 12/25/19 0426 12/26/19 0306 12/27/19 0608  WBC 12.9* 8.2 7.7 7.9 7.0  NEUTROABS 9.9*  --   --   --   --   HGB 12.0 9.3* 9.3* 10.2* 10.2*  HCT 35.2* 27.9* 28.4* 30.5* 31.0*  MCV 88.4 89.1 90.4 90.0 90.4  PLT 214 184 200 234 238   Cardiac Enzymes: No results for input(s): CKTOTAL, CKMB, CKMBINDEX, TROPONINI in the last 168 hours. BNP: Invalid input(s): POCBNP CBG: Recent Labs  Lab 12/26/19 0810 12/26/19 1126 12/26/19 1556 12/26/19 2149 12/27/19 0840  GLUCAP 214* 194* 148* 159* 199*   D-Dimer  No results for input(s): DDIMER in the last 72 hours. Hgb A1c No results for input(s): HGBA1C in the last 72 hours. Lipid Profile No results for input(s): CHOL, HDL, LDLCALC, TRIG, CHOLHDL, LDLDIRECT in the last 72 hours. Thyroid function studies No results for input(s): TSH, T4TOTAL, T3FREE, THYROIDAB in the last 72 hours.  Invalid input(s): FREET3 Anemia work up No results for input(s): VITAMINB12, FOLATE, FERRITIN, TIBC, IRON, RETICCTPCT in the last 72 hours. Urinalysis    Component Value Date/Time   COLORURINE YELLOW (A) 12/23/2019 0740   APPEARANCEUR HAZY (A) 12/23/2019 0740   APPEARANCEUR Clear 11/21/2015 0919   LABSPEC 1.009 12/23/2019 0740   PHURINE 5.0 12/23/2019 0740   GLUCOSEU NEGATIVE 12/23/2019 0740   HGBUR NEGATIVE 12/23/2019 0740   BILIRUBINUR NEGATIVE 12/23/2019 0740   BILIRUBINUR neg 12/05/2019 1206   BILIRUBINUR Negative 11/21/2015 0919   KETONESUR 20 (A) 12/23/2019 0740   PROTEINUR 30 (A) 12/23/2019 0740   UROBILINOGEN 0.2 12/05/2019 1206    NITRITE NEGATIVE 12/23/2019 0740   LEUKOCYTESUR NEGATIVE 12/23/2019 0740   Sepsis Labs Invalid input(s): PROCALCITONIN,  WBC,  LACTICIDVEN Microbiology Recent Results (from the past 240 hour(s))  Blood Culture (routine x 2)     Status: None (Preliminary result)   Collection Time: 12/23/19  7:40 AM   Specimen: BLOOD  Result Value Ref Range Status   Specimen Description BLOOD BLOOD LEFT FOREARM  Final   Special Requests   Final    BOTTLES DRAWN AEROBIC AND ANAEROBIC Blood Culture adequate volume   Culture   Final    NO GROWTH 4 DAYS Performed at [facility], [address], [place], [place] [address]    Report Status PENDING  Incomplete  Urine culture     Status: None   Collection Time: 12/23/19  7:40 AM   Specimen: In/Out Cath Urine  Result Value Ref Range Status   Specimen Description   Final    IN/OUT CATH URINE Performed at [facility], [address] [place], [place] [address]    Special Requests   Final    NONE Performed at [facility], [address] [place], [place] [address]    Culture   Final    NO GROWTH Performed at [facility], [place] [address] [place], [place] [address]    Report Status 12/24/2019 FINAL  Final  Respiratory Panel by RT PCR (Flu A&B, Covid) - Nasopharyngeal Swab     Status: None   Collection Time: 12/23/19  7:40 AM   Specimen: Nasopharyngeal Swab  Result Value Ref Range Status   SARS Coronavirus 2 by RT PCR NEGATIVE NEGATIVE Final    Comment: (NOTE) SARS-CoV-2 target nucleic acids are NOT DETECTED.  The SARS-CoV-2 RNA is generally detectable in upper respiratoy specimens during the acute phase of infection. The lowest concentration of SARS-CoV-2 viral copies this assay can detect is 131 copies/mL. A negative result does not preclude SARS-Cov-2 infection and should not be used as the sole basis for treatment or other patient management decisions. A negative result may occur with  improper specimen  collection/handling, submission of specimen other than nasopharyngeal swab, presence of viral mutation(s) within the areas targeted by this assay, and inadequate number of viral copies (<131 copies/mL). A negative result must be combined with clinical observations, patient history, and epidemiological information. The expected result is Negative.  Fact Sheet for Patients:  PinkCheek.be  Fact Sheet for Healthcare Providers:  GravelBags.it  This test is no t yet approved or cleared  by the [place] and  has been authorized for detection and/or diagnosis of SARS-CoV-2 by FDA under an Emergency Use Authorization (EUA). This EUA will remain  in effect (meaning this test can be used) for the duration of the COVID-19 declaration under Section 564(b)(1) of the Act, 21 U.S.C. section 360bbb-3(b)(1), unless the authorization is terminated or revoked sooner.     Influenza A by PCR NEGATIVE NEGATIVE Final   Influenza B by PCR NEGATIVE NEGATIVE Final    Comment: (NOTE) The Xpert Xpress SARS-CoV-2/FLU/RSV assay is intended as an aid in  the diagnosis of influenza from Nasopharyngeal swab specimens and  should not be used as a sole basis for treatment. Nasal washings and  aspirates are unacceptable for Xpert Xpress SARS-CoV-2/FLU/RSV  testing.  Fact Sheet for Patients: PinkCheek.be  Fact Sheet for Healthcare Providers: GravelBags.it  This test is not yet approved or cleared by the [place] FDA and  has been authorized for detection and/or diagnosis of SARS-CoV-2 by  FDA under an Emergency Use Authorization (EUA). This EUA will remain  in effect (meaning this test can be used) for the duration of the  Covid-19 declaration under Section 564(b)(1) of the Act, 21  U.S.C. section 360bbb-3(b)(1), unless the authorization is  terminated or revoked. Performed at [facility], [address], [place], [place] [address]   Blood Culture (routine x 2)     Status: None (Preliminary result)   Collection Time: 12/23/19  8:15 AM   Specimen: BLOOD  Result Value Ref Range Status   Specimen Description BLOOD BLOOD LEFT HAND  Final   Special Requests   Final    BOTTLES DRAWN AEROBIC AND ANAEROBIC Blood Culture results may not be optimal due to an inadequate volume of blood received in culture bottles   Culture   Final    NO GROWTH 4 DAYS Performed at [facility], [address], [place], [place] [address]    Report Status PENDING  Incomplete  MRSA PCR Screening     Status: None   Collection Time: 12/24/19  5:30 PM   Specimen: Nasopharyngeal  Result Value Ref Range Status   MRSA by PCR NEGATIVE NEGATIVE Final    Comment:        The GeneXpert MRSA Assay (FDA approved for NASAL specimens only), is one component of a comprehensive MRSA colonization surveillance program. It is not intended to diagnose MRSA infection nor to guide or monitor treatment for MRSA infections. Performed at [facility], [address] [place], [place] [address]      Total time spend on discharging this patient, including the last patient exam, discussing the hospital stay, instructions for ongoing care as it relates to all pertinent caregivers, as well as preparing the medical discharge records, prescriptions, and/or referrals as applicable, is 35 minutes.    Enzo Bi, MD  [facility] 12/27/2019, 9:29 AM  If 7PM-7AM, please contact night-coverage

## 2019-12-27 NOTE — NC FL2 (Signed)
[place] LEVEL OF CARE SCREENING TOOL     IDENTIFICATION  Patient Name: [name] Birthdate: 09-15-1931 Sex: female Admission Date (Current Location): 12/23/2019  [facility] and [place] Number:  Engineering geologist and Address:  [facility], [address], [place], [place] [address]      Provider Number: [id number]  Attending Physician Name and Address:  Enzo Bi, MD  Relative Name and Phone Number:       Current Level of Care: Hospital Recommended Level of Care: [place] Prior Approval Number:    Date Approved/Denied:   PASRR Number:    Discharge Plan: Other (Comment) (ALF)    Current Diagnoses: Patient Active Problem List   Diagnosis Date Noted  . Pressure injury of skin 12/24/2019  . Sepsis ([place]) 12/23/2019  . Dehydration 12/23/2019  . Aspiration pneumonia ([place]) 12/23/2019  . Acute respiratory failure ([place]) 12/23/2019  . Abnormal echocardiogram 10/12/2019  . Elevated troponin 10/12/2019  . Chest pain of uncertain etiology   . Closed fracture of right hip ([place])   . Fall   . Intertrochanteric fracture of right femur ([place]) 06/11/2019  . Femur fracture, right ([place]) 06/11/2019  . Hip pain 12/23/2018  . Right ankle pain 12/23/2018  . Mixed hyperlipidemia 04/29/2017  . Lower extremity edema 12/24/2016  . Fatigue 12/24/2016  . Current use of long term anticoagulation 09/17/2016  . Paroxysmal atrial fibrillation ([place]) 08/13/2016  . Seizure ([place])   . Syncope and collapse   . Syncope 11/21/2015  . ARF (acute renal failure) ([place]) 10/26/2015  . Laceration of right lower leg 09/28/2015  . Bursitis of hip 08/27/2015  . Senile purpura ([place]) 01/01/2015  . Edema 08/15/2014  . Arthritis 07/12/2014  . Basal cell carcinoma 07/12/2014  . CC (collagenous colitis) 07/12/2014  . Essential hypertension 07/12/2014  . Hypercholesteremia 07/12/2014  . Adiposity 07/12/2014  . Acne erythematosa 07/12/2014  .  Diabetes mellitus, type 2 ([place]) 07/12/2014  . 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 Avitaminosis D 07/12/2014  . H/O malignant neoplasm of skin 10/10/2011    Orientation RESPIRATION BLADDER Height & Weight     Self, Time, Situation, Place  Normal External catheter, Incontinent (placed 10/15) Weight: 159 lb 2.8 oz (72.2 kg) Height:  5' 1.5" (156.2 cm)  BEHAVIORAL SYMPTOMS/MOOD NEUROLOGICAL BOWEL NUTRITION STATUS      Incontinent Diet  AMBULATORY STATUS COMMUNICATION OF NEEDS Skin   Limited Assist Verbally PU Stage and Appropriate Care PU Stage 1 Dressing:  (sacrum, foam dressing, change every 3 days)                     Personal Care Assistance Level of Assistance  Dressing, Feeding, Bathing Bathing Assistance: Limited assistance Feeding assistance: Independent Dressing Assistance: Limited assistance     Functional Limitations Info  Sight, Hearing, Speech Sight Info: Adequate Hearing Info: Adequate Speech Info: Adequate    SPECIAL CARE FACTORS FREQUENCY  PT (By licensed PT), OT (By licensed OT)     PT Frequency: 2x OT Frequency: 2x            Contractures Contractures Info: Not present    Additional Factors Info  Code Status, Allergies Code Status Info: DNR Allergies Info: Morphine Sulfate, Penicillins           Current Medications (12/27/2019):  This is the current hospital active medication list Current Facility-Administered Medications  Medication Dose Route Frequency Provider Last Rate Last Admin  . 0.9 %  sodium chloride infusion  Intravenous PRN Enzo Bi, MD 10 mL/hr at 12/27/19 1021 500 mL at 12/27/19 1021  . acetaminophen (TYLENOL) tablet 1,000 mg  1,000 mg Oral BID Agbata, Tochukwu, MD   1,000 mg at 12/27/19 0853  . apixaban (ELIQUIS) tablet 5 mg  5 mg Oral BID Agbata, Tochukwu, MD   5 mg at 12/27/19 0854  . azithromycin (ZITHROMAX) tablet 500 mg  500 mg Oral Daily Enzo Bi, MD   500 mg at 12/27/19 0853  . cefTRIAXone (ROCEPHIN) 1 g in sodium chloride  0.9 % 100 mL IVPB  1 g Intravenous Q24H Enzo Bi, MD 200 mL/hr at 12/27/19 1022 1 g at 12/27/19 1022  . cholecalciferol (VITAMIN D) tablet 2,000 Units  2,000 Units Oral Daily Agbata, Tochukwu, MD   2,000 Units at 12/27/19 0853  . fluticasone (FLONASE) 50 MCG/ACT nasal spray 1 spray  1 spray Each Nare Daily Enzo Bi, MD   1 spray at 12/27/19 0855  . guaiFENesin (MUCINEX) 12 hr tablet 600 mg  600 mg Oral BID Enzo Bi, MD   600 mg at 12/27/19 0854  . insulin aspart (novoLOG) injection 0-15 Units  0-15 Units Subcutaneous TID WC Agbata, Tochukwu, MD   3 Units at 12/27/19 0902  . lisinopril (ZESTRIL) tablet 20 mg  20 mg Oral Daily Enzo Bi, MD   20 mg at 12/27/19 0854  . loperamide (IMODIUM) capsule 2 mg  2 mg Oral BID PRN Enzo Bi, MD   2 mg at 12/26/19 0943  . metoprolol succinate (TOPROL-XL) 24 hr tablet 25 mg  25 mg Oral Daily Enzo Bi, MD   25 mg at 12/27/19 0854  . multivitamin with minerals tablet 1 tablet  1 tablet Oral Daily Agbata, Tochukwu, MD   1 tablet at 12/27/19 0854  . ondansetron (ZOFRAN) tablet 4 mg  4 mg Oral Q6H PRN Agbata, Tochukwu, MD       Or  . ondansetron (ZOFRAN) injection 4 mg  4 mg Intravenous Q6H PRN Agbata, Tochukwu, MD      . simvastatin (ZOCOR) tablet 20 mg  20 mg Oral QHS Agbata, Tochukwu, MD   20 mg at 12/26/19 2115  . traZODone (DESYREL) tablet 50 mg  50 mg Oral QHS PRN Mansy, Jan A, MD   50 mg at 12/26/19 2114     Discharge Medications: Please see discharge summary for a list of discharge medications.  Relevant Imaging Results:  Relevant Lab Results:   Additional Information SSN:[id number]  Eileen Stanford, LCSW

## 2019-12-27 NOTE — Plan of Care (Addendum)
  Problem: Education: Goal: Knowledge of General Education information will improve Description: Including pain rating scale, medication(s)/side effects and non-pharmacologic comfort measures Outcome: Progressing   Problem: Health Behavior/Discharge Planning: Goal: Ability to manage health-related needs will improve Outcome: Progressing   Problem: Activity: Goal: Ability to tolerate increased activity will improve Outcome: Progressing   Problem: Respiratory: Goal: Ability to maintain adequate ventilation will improve Outcome: Progressing

## 2019-12-27 NOTE — Progress Notes (Addendum)
Mobility Specialist - Progress Note   12/27/19 1102  Mobility  Activity Ambulated in room  Level of Assistance Standby assist, set-up cues, supervision of patient - no hands on (CGA for safety)  Assistive Device Front wheel walker  Distance Ambulated (ft) 20 ft  Mobility Response Tolerated well  Mobility performed by Mobility specialist  $Mobility charge 1 Mobility    Pt laying in bed w/ nurse present in room upon arrival. Pt agreed to session. Pt transitioned from supine to sit mod. Independently. Pt S2S SBA. Pt ambulated ~20' total in room using RW w/ SBA. CGA utilized for safety. Pt informs mobility specialist that she uses a rollator at baseline. No LOB noted. No c/o pain or SOB. Pt on RA t/o session. Pt O2 sat > 95%. Pt required 2 seated breaks d/t incontinent BM episodes. Pt having loose BM. NT notified to assist. Mobility specialist assisted w/ hygiene and cleaning. Overall, pt tolerated session well. Pt left sitting on recliner w/ all needs placed in reach. Nurse was notified.     Kingslee Dowse Mobility Specialist  12/27/19, 11:07 AM

## 2019-12-27 NOTE — Clinical Social Work Note (Signed)
Pt will d/c back to [facility]. [facility] will pick pt up at 2:30. RN to call report to [phone].  [place], [place]

## 2019-12-28 LAB — CULTURE, BLOOD (ROUTINE X 2)
Culture: NO GROWTH
Culture: NO GROWTH
Special Requests: ADEQUATE

## 2019-12-28 NOTE — Progress Notes (Deleted)
     Established patient visit   [name]   DOB: 1931-07-31   86 y.o. Female  MRN: [id number] Visit Date: 12/29/2019  Today's healthcare provider: [name], PA   No chief complaint on file.  Subjective    HPI  Follow up Hospitalization  Patient was admitted to *** on *** and discharged on ***. She was treated for ***. Treatment for this included ***. Telephone follow up was done on *** She reports {excellent/good/fair:19992} compliance with treatment. She reports this condition is {resolved/improved/worsened:23923}.  ----------------------------------------------------------------------------------------- -    {Show patient history (optional):23778::" "}   Medications: Outpatient Medications Prior to Visit  Medication Sig  . acetaminophen (TYLENOL) 500 MG tablet Take 2 tablets (1,000 mg total) by mouth 2 (two) times daily as needed (pain.).  Marland Kitchen apixaban (ELIQUIS) 5 MG TABS tablet Take 1 tablet (5 mg total) by mouth 2 (two) times daily.  . Cholecalciferol (VITAMIN D) 2000 UNITS CAPS Take 2,000 Units by mouth daily.   . Clobetasol Propionate 0.05 % shampoo 2- 3 times per week lather on scalp, leave on 20 minutes, wash out.  . Fluocinolone Acetonide Scalp 0.01 % OIL Apply to scalp in evening, cover with shower cap, wash off in morning. Use on evenings prior to shampooing next day.  . furosemide (LASIX) 20 MG tablet Take 1 tablet (20 mg total) by mouth daily. (only if needed).  Marland Kitchen glipiZIDE (GLUCOTROL XL) 5 MG 24 hr tablet TALE 1 TABLET BY MOUTH ONCE DAILY WITH BREAKFAST  . lisinopril (ZESTRIL) 20 MG tablet Take 1 tablet (20 mg total) by mouth daily.  . metFORMIN (GLUCOPHAGE) 500 MG tablet TAKE 1 TABLET BY MOUTH TWICE DAILY WITH MEALS  . metoprolol succinate (TOPROL-XL) 25 MG 24 hr tablet TAKE 1 TABLET BY MOUTH ONCE DAILY  . Multiple Vitamin (MULTIVITAMIN WITH MINERALS) TABS tablet Take 1 tablet by mouth daily.  . simvastatin (ZOCOR) 20 MG tablet TAKE ONE  TABLET BY MOUTH EVERY NIGHT AT BEDTIME (Patient taking differently: Take 20 mg by mouth at bedtime. )   No facility-administered medications prior to visit.    Review of Systems  {Heme  Chem  Endocrine  Serology  Results Review (optional):23779::" "}  Objective    There were no vitals taken for this visit. {Show previous vital signs (optional):23777::" "}  Physical Exam  ***  No results found for any visits on 12/29/19.  Assessment & Plan     ***  No follow-ups on file.      {provider attestation***:1}   [name], [place] [phone] (phone) [phone] (fax)  [place]

## 2019-12-29 ENCOUNTER — Inpatient Hospital Stay: Payer: Medicare Other | Admitting: Family Medicine

## 2020-01-03 ENCOUNTER — Ambulatory Visit: Payer: Medicare Other | Admitting: Physician Assistant

## 2020-01-07 ENCOUNTER — Other Ambulatory Visit: Payer: Self-pay | Admitting: Internal Medicine

## 2020-01-18 ENCOUNTER — Other Ambulatory Visit: Payer: Self-pay

## 2020-01-18 ENCOUNTER — Ambulatory Visit (INDEPENDENT_AMBULATORY_CARE_PROVIDER_SITE_OTHER): Payer: Medicare Other | Admitting: Internal Medicine

## 2020-01-18 ENCOUNTER — Encounter: Payer: Self-pay | Admitting: Internal Medicine

## 2020-01-18 VITALS — BP 118/60 | HR 60 | Ht 61.5 in | Wt 152.0 lb

## 2020-01-18 DIAGNOSIS — E782 Mixed hyperlipidemia: Secondary | ICD-10-CM | POA: Diagnosis not present

## 2020-01-18 DIAGNOSIS — I10 Essential (primary) hypertension: Secondary | ICD-10-CM | POA: Diagnosis not present

## 2020-01-18 DIAGNOSIS — I480 Paroxysmal atrial fibrillation: Secondary | ICD-10-CM | POA: Diagnosis not present

## 2020-01-18 NOTE — Progress Notes (Signed)
Follow-up Outpatient Visit Date: 01/18/2020  Primary Care Provider: Margo Common, [place] [address]  Chief Complaint: Follow-up atrial fibrillation  HPI:  [name] is a 86 y.o. female with history of paroxysmal atrial fibrillation, hypertension, hyperlipidemia, type 2 diabetes mellitus, and recurrent syncope thought to be vasovagal in nature, who presents for follow-up of atrial fibrillation and syncope.  I last saw her in August after fall leading to right intertrochanteric femur fracture that was managed surgically.  She had postsurgical chest pain that resolved with application of a lidocaine patch.  Echo showed normal LVEF with mild basal and mid inferior/inferoseptal hypokinesis.  We performed a myocardial perfusion stress test after our last visit, which was low risk without evidence of ischemia or scar.  LVEF was normal.  She was hospitalized last month with community-acquired pneumonia and sepsis.  EKG on admission showed rate controlled atrial fibrillation, which was asymptomatic.  [name] was fatigued after returning back to [facility] but feels back to her baseline.  [name] has not had any chest pain, shortness of breath, palpitations, or lightheadedness.  She notes occasional leg swelling, which improves with furosemide use.  She has not had any additional falls and is ambulating with her rolling walker.  Physical therapy was recommended after her recent hospitalization, though this has not been started yet at [facility].  --------------------------------------------------------------------------------------------------  Past Medical History:  Diagnosis Date  . Basal cell carcinoma 03/30/2008   left upper arm/excision  . Basal cell carcinoma 03/31/2008   left upper arm, right lower leg  . Basal cell carcinoma 01/18/2009   left upper arm  . Chronic diarrhea   . Collagenous colitis   . Diabetes mellitus without complication ([place])   .  Diverticulitis   . Heart murmur    at birth, none since  . History of echocardiogram    a. 11/2015: echo showing EF of 60-65% with no WMA. Grade 1 DD and mild MR noted.   . Hypertension   . PAF (paroxysmal atrial fibrillation) ([place])    a. initially diagnosed in 08/2016 --> started on Eliquis  . Syncope    a. initially occurring in Fall 2016 b. Hospitalized in 10/2015 and 11/2015 for recurrent episodes.    Past Surgical History:  Procedure Laterality Date  . ABDOMINAL HYSTERECTOMY    . BACK SURGERY  08/08/2008   Lumbar discectomy  . COLONOSCOPY WITH PROPOFOL    . EXCISION/RELEASE BURSA HIP Left 05/03/2019   Procedure: HIP ABDUCTOR REPAIR;  Surgeon: Hessie Knows, MD;  Location: [facility] ORS;  Service: Orthopedics;  Laterality: Left;  . INTRAMEDULLARY (IM) NAIL INTERTROCHANTERIC Right 06/13/2019   Procedure: INTRAMEDULLARY (IM) NAIL INTERTROCHANTRIC;  Surgeon: Hessie Knows, MD;  Location: [facility] ORS;  Service: Orthopedics;  Laterality: Right;  . NECK SURGERY  1980    Current Meds  Medication Sig  . acetaminophen (TYLENOL) 500 MG tablet Take 2 tablets (1,000 mg total) by mouth 2 (two) times daily as needed (pain.).  Marland Kitchen apixaban (ELIQUIS) 5 MG TABS tablet Take 1 tablet (5 mg total) by mouth 2 (two) times daily.  . Cholecalciferol (VITAMIN D) 2000 UNITS CAPS Take 2,000 Units by mouth daily.   . Clobetasol Propionate 0.05 % shampoo 2- 3 times per week lather on scalp, leave on 20 minutes, wash out.  . Fluocinolone Acetonide Scalp 0.01 % OIL Apply to scalp in evening, cover with shower cap, wash off in morning. Use on evenings prior to shampooing next day.  Marland Kitchen  furosemide (LASIX) 20 MG tablet Take 1 tablet (20 mg total) by mouth daily. (only if needed).  Marland Kitchen glipiZIDE (GLUCOTROL XL) 5 MG 24 hr tablet TALE 1 TABLET BY MOUTH ONCE DAILY WITH BREAKFAST  . lisinopril (ZESTRIL) 20 MG tablet Take 1 tablet (20 mg total) by mouth daily.  . metFORMIN (GLUCOPHAGE) 500 MG tablet TAKE 1 TABLET BY MOUTH TWICE DAILY  WITH MEALS  . metoprolol succinate (TOPROL-XL) 25 MG 24 hr tablet TAKE 1 TABLET BY MOUTH ONCE DAILY  . Multiple Vitamin (MULTIVITAMIN WITH MINERALS) TABS tablet Take 1 tablet by mouth daily.  . simvastatin (ZOCOR) 20 MG tablet TAKE ONE TABLET BY MOUTH EVERY NIGHT AT BEDTIME    Allergies: Morphine sulfate and Penicillins  Social History   Tobacco Use  . Smoking status: Former Smoker    Quit date: 03/09/1978    Years since quitting: 41.8  . Smokeless tobacco: Never Used  Vaping Use  . Vaping Use: Never used  Substance Use Topics  . Alcohol use: Yes    Alcohol/week: 0.0 - 1.0 standard drinks  . Drug use: No    Family History  Problem Relation Age of Onset  . Hypertension Mother   . Diabetes Mother   . Lung cancer Father     Review of Systems: A 12-system review of systems was performed and was negative except as noted in the HPI.  --------------------------------------------------------------------------------------------------  Physical Exam: BP 118/60 (BP Location: Left Arm, Patient Position: Sitting, Cuff Size: Normal)   Pulse 60   Ht 5' 1.5" (1.562 m)   Wt 152 lb (68.9 kg)   SpO2 98%   BMI 28.26 kg/m   General: NAD.  Accompanied by her daughter. Neck: No JVD or HJR. Lungs: Clear to auscultation bilaterally without wheezes or crackles. Heart: Regular rate and rhythm without murmurs, rubs, or gallops. Abdomen: Soft, nontender, nondistended. Extremities: Trace ankle edema.   Lab Results  Component Value Date   WBC 7.0 12/27/2019   HGB 10.2 (L) 12/27/2019   HCT 31.0 (L) 12/27/2019   MCV 90.4 12/27/2019   PLT 238 12/27/2019    Lab Results  Component Value Date   NA 140 12/27/2019   K 3.8 12/27/2019   CL 106 12/27/2019   CO2 24 12/27/2019   BUN 13 12/27/2019   CREATININE 0.81 12/27/2019   GLUCOSE 206 (H) 12/27/2019   ALT 71 (H) 12/23/2019    Lab Results  Component Value Date   CHOL 149 12/21/2018   HDL 52 12/21/2018   LDLCALC 69 12/21/2018    TRIG 163 (H) 12/21/2018   CHOLHDL 2.9 12/21/2018    --------------------------------------------------------------------------------------------------  ASSESSMENT AND PLAN: Paroxysmal atrial fibrillation: [name] has not had any symptoms of recurrent atrial fibrillation.  Admission EKG last month was notable for rate controlled atrial fibrillation.  Tracing was not performed today, but her pulse is quite regular suggesting that she is back in sinus rhythm.  We will continue with metoprolol succinate 25 mg daily for rate control as well as indefinite anticoagulation with apixaban 5 mg twice daily.  Fall precautions were reinforced.  Hypertension: Blood pressure well controlled today.  We will continue current regimen of lisinopril and metoprolol.  Hyperlipidemia: Continue simvastatin 20 mg daily, with ongoing management per Mr. Natale Milch.  Follow-up: Return to clinic in 3 months.  Nelva Bush, MD 01/20/2020 6:45 AM

## 2020-01-18 NOTE — Patient Instructions (Signed)
Medication Instructions:  Your physician recommends that you continue on your current medications as directed. Please refer to the Current Medication list given to you today.  *If you need a refill on your cardiac medications before your next appointment, please call your pharmacy*   Lab Work: None ordered   If you have labs (blood work) drawn today and your tests are completely normal, you will receive your results only by: MyChart Message (if you have MyChart) OR A paper copy in the mail If you have any lab test that is abnormal or we need to change your treatment, we will call you to review the results.   Testing/Procedures: None ordered  Follow-Up: At [facility], you and your health needs are our priority.  As part of our continuing mission to provide you with exceptional heart care, we have created designated Provider Care Teams.  These Care Teams include your primary Cardiologist (physician) and Advanced Practice Providers (APPs -  Physician Assistants and Nurse Practitioners) who all work together to provide you with the care you need, when you need it.  We recommend signing up for the patient portal called "MyChart".  Sign up information is provided on this After Visit Summary.  MyChart is used to connect with patients for Virtual Visits (Telemedicine).  Patients are able to view lab/test results, encounter notes, upcoming appointments, etc.  Non-urgent messages can be sent to your provider as well.   To learn more about what you can do with MyChart, go to [email or web address].    Your next appointment:   3 month(s)  The format for your next appointment:   In Person  Provider:   You may see Christopher End, MD  or one of the following Advanced Practice Providers on your designated Care Team:   Christopher Berge, NP Ryan Dunn, PA-C Jacquelyn Visser, PA-C Cadence Furth, PA-C    

## 2020-01-20 ENCOUNTER — Encounter: Payer: Self-pay | Admitting: Internal Medicine

## 2020-01-24 ENCOUNTER — Telehealth: Payer: Self-pay

## 2020-01-24 NOTE — Telephone Encounter (Signed)
Please review.  Did you fax something to [facility]?  Thanks,   Mickel Baas     Copied from [place] [id number]. Topic: General - Inquiry >> Jan 24, 2020 11:33 AM Gillis Ends D wrote: Reason for CRM: Someone called from [facility] stating that they received some paper work for the patient but when the fax came through it only had one page but the fax stated it should have been 2. She asked that you re- fax the paperwork once again. Please advise

## 2020-01-30 NOTE — Telephone Encounter (Signed)
Done. TNP 

## 2020-02-09 ENCOUNTER — Telehealth: Payer: Medicare Other | Admitting: Family Medicine

## 2020-02-09 ENCOUNTER — Ambulatory Visit (INDEPENDENT_AMBULATORY_CARE_PROVIDER_SITE_OTHER): Payer: Medicare Other | Admitting: Family Medicine

## 2020-02-09 ENCOUNTER — Encounter: Payer: Self-pay | Admitting: Family Medicine

## 2020-02-09 ENCOUNTER — Other Ambulatory Visit: Payer: Self-pay

## 2020-02-09 VITALS — BP 109/58 | HR 74 | Temp 97.9°F | Resp 16 | Ht 62.0 in | Wt 157.0 lb

## 2020-02-09 DIAGNOSIS — Z8781 Personal history of (healed) traumatic fracture: Secondary | ICD-10-CM

## 2020-02-09 DIAGNOSIS — J690 Pneumonitis due to inhalation of food and vomit: Secondary | ICD-10-CM

## 2020-02-09 DIAGNOSIS — I10 Essential (primary) hypertension: Secondary | ICD-10-CM | POA: Diagnosis not present

## 2020-02-09 DIAGNOSIS — E1100 Type 2 diabetes mellitus with hyperosmolarity without nonketotic hyperglycemic-hyperosmolar coma (NKHHC): Secondary | ICD-10-CM

## 2020-02-09 NOTE — Progress Notes (Signed)
Established patient visit   [name]   DOB: January 29, 1932   86 y.o. Female  MRN: [id number] Visit Date: 02/09/2020  Today's healthcare provider: [name], PA   Chief Complaint  Patient presents with   Diabetes   Subjective    HPI HPI     Follow up after lab work    Last edited by Jeanelle Malling, CMA on 02/09/2020 10:37 AM. (History)       Hospitalized 12-23-19 through 12-27-19 for CAP with sepsis. Temp was up to a max of 102F with tachycardia, Tachypnea, leukocytosis with left shift. Pulse oximetry dropped to 88% and improve with 2 LPM oxygen. Treated with Unasyn and switched to Ceftriaxonea and Azithromycin for 5 days. Oxygen weaned back to room air by 12-25-19. No longer having any shortness of breath or chest pains. Chart visit reviewed. Past Medical History:  Diagnosis Date   Basal cell carcinoma 03/30/2008   left upper arm/excision   Basal cell carcinoma 03/31/2008   left upper arm, right lower leg   Basal cell carcinoma 01/18/2009   left upper arm   Chronic diarrhea    Collagenous colitis    Diabetes mellitus without complication ([place])    Diverticulitis    Heart murmur    at birth, none since   History of echocardiogram    a. 11/2015: echo showing EF of 60-65% with no WMA. Grade 1 DD and mild MR noted.    Hypertension    PAF (paroxysmal atrial fibrillation) ([place])    a. initially diagnosed in 08/2016 --> started on Eliquis   Syncope    a. initially occurring in Fall 2016 b. Hospitalized in 10/2015 and 11/2015 for recurrent episodes.    Past Surgical History:  Procedure Laterality Date   ABDOMINAL HYSTERECTOMY     BACK SURGERY  08/08/2008   Lumbar discectomy   COLONOSCOPY WITH PROPOFOL     EXCISION/RELEASE BURSA HIP Left 05/03/2019   Procedure: HIP ABDUCTOR REPAIR;  Surgeon: Hessie Knows, MD;  Location: [facility] ORS;  Service: Orthopedics;  Laterality: Left;   INTRAMEDULLARY (IM) NAIL INTERTROCHANTERIC Right 06/13/2019   Procedure:  INTRAMEDULLARY (IM) NAIL INTERTROCHANTRIC;  Surgeon: Hessie Knows, MD;  Location: [facility] ORS;  Service: Orthopedics;  Laterality: Right;   NECK SURGERY  1980   Social History   Tobacco Use   Smoking status: Former Smoker    Quit date: 03/09/1978    Years since quitting: 41.9   Smokeless tobacco: Never Used  Vaping Use   Vaping Use: Never used  Substance Use Topics   Alcohol use: Yes    Alcohol/week: 0.0 - 1.0 standard drinks   Drug use: No   Family History  Problem Relation Age of Onset   Hypertension Mother    Diabetes Mother    Lung cancer Father    Allergies  Allergen Reactions   Morphine Sulfate Other (See Comments)    BP bottoms out   Penicillins Diarrhea    Has patient had a PCN reaction causing immediate rash, facial/tongue/throat swelling, SOB or lightheadedness with hypotension: Yes Has patient had a PCN reaction causing severe rash involving mucus membranes or skin necrosis: Yes Has patient had a PCN reaction that required hospitalization No Has patient had a PCN reaction occurring within the last 10 years: No If all of the above answers are "NO", then may proceed with Cephalosporin use.       Medications: Outpatient Medications Prior to Visit  Medication Sig   acetaminophen (TYLENOL) 500  MG tablet Take 2 tablets (1,000 mg total) by mouth 2 (two) times daily as needed (pain.).   apixaban (ELIQUIS) 5 MG TABS tablet Take 1 tablet (5 mg total) by mouth 2 (two) times daily.   Cholecalciferol (VITAMIN D) 2000 UNITS CAPS Take 2,000 Units by mouth daily.    Clobetasol Propionate 0.05 % shampoo 2- 3 times per week lather on scalp, leave on 20 minutes, wash out.   Fluocinolone Acetonide Scalp 0.01 % OIL Apply to scalp in evening, cover with shower cap, wash off in morning. Use on evenings prior to shampooing next day.   furosemide (LASIX) 20 MG tablet Take 1 tablet (20 mg total) by mouth daily. (only if needed).   glipiZIDE (GLUCOTROL XL) 5 MG 24 hr tablet TALE 1 TABLET  BY MOUTH ONCE DAILY WITH BREAKFAST   lisinopril (ZESTRIL) 20 MG tablet Take 1 tablet (20 mg total) by mouth daily.   metFORMIN (GLUCOPHAGE) 500 MG tablet TAKE 1 TABLET BY MOUTH TWICE DAILY WITH MEALS   metoprolol succinate (TOPROL-XL) 25 MG 24 hr tablet TAKE 1 TABLET BY MOUTH ONCE DAILY   Multiple Vitamin (MULTIVITAMIN WITH MINERALS) TABS tablet Take 1 tablet by mouth daily.   simvastatin (ZOCOR) 20 MG tablet TAKE ONE TABLET BY MOUTH EVERY NIGHT AT BEDTIME   No facility-administered medications prior to visit.    Review of Systems  Constitutional: Negative.   HENT: Negative.   Respiratory: Negative.   Cardiovascular: Negative.        Objective    BP (!) 109/58   Pulse 74   Temp 97.9 F (36.6 C) (Oral)   Resp 16   Ht 5\' 2"  (1.575 m)   Wt 157 lb (71.2 kg)   SpO2 100%   BMI 28.72 kg/m     Physical Exam Constitutional:      General: She is not in acute distress.    Appearance: She is well-developed.  HENT:     Head: Normocephalic and atraumatic.     Right Ear: Hearing normal.     Left Ear: Hearing normal.     Nose: Nose normal.  Eyes:     General: Lids are normal. No scleral icterus.       Right eye: No discharge.        Left eye: No discharge.     Conjunctiva/sclera: Conjunctivae normal.  Cardiovascular:     Rate and Rhythm: Normal rate and regular rhythm.     Heart sounds: Normal heart sounds.  Pulmonary:     Effort: Pulmonary effort is normal. No respiratory distress.     Breath sounds: Normal breath sounds.  Abdominal:     General: Bowel sounds are normal.     Palpations: Abdomen is soft.  Musculoskeletal:        General: Normal range of motion.     Cervical back: Neck supple.     Comments: Poor balance since since fracture of both hips in 2021.  Skin:    Findings: No lesion or rash.  Neurological:     Mental Status: She is alert and oriented to person, place, and time.  Psychiatric:        Speech: Speech normal.        Behavior: Behavior normal.         Thought Content: Thought content normal.    No results found for any visits on 02/09/20.  Assessment & Plan     1. Aspiration pneumonia of both upper lobes, unspecified aspiration pneumonia type ([place])  -  DG Chest 2 View; Future  2. Type 2 diabetes mellitus with hyperosmolarity without coma, without long-term current use of insulin ([place])   3. Essential hypertension   4. History of hip fracture S/P right hip IM nailing 06-13-19 for intertrochanteric fracture of femur and    No follow-ups on file.      Andres Shad, PA, have reviewed all documentation for this visit. The documentation on 02/09/20 for the exam, diagnosis, procedures, and orders are all accurate and complete.  Am the supervising physician for Fannie Knee, PA and I am signing off on this unfinished note with no further clinical information Wilhemena Durie., MD   [name], [place] [phone] (phone) [phone] (fax)  [place]

## 2020-02-13 ENCOUNTER — Ambulatory Visit
Admission: RE | Admit: 2020-02-13 | Discharge: 2020-02-13 | Disposition: A | Discharge status: Home / Self Care | Payer: Medicare Other | Source: Ambulatory Visit | Attending: Family Medicine | Admitting: Family Medicine

## 2020-02-13 ENCOUNTER — Other Ambulatory Visit: Payer: Self-pay

## 2020-02-13 DIAGNOSIS — J690 Pneumonitis due to inhalation of food and vomit: Secondary | ICD-10-CM | POA: Insufficient documentation

## 2020-02-14 ENCOUNTER — Ambulatory Visit: Payer: Medicare Other | Admitting: Dermatology

## 2020-03-07 ENCOUNTER — Other Ambulatory Visit: Payer: Self-pay | Admitting: Family Medicine

## 2020-03-07 DIAGNOSIS — R609 Edema, unspecified: Secondary | ICD-10-CM

## 2020-03-07 NOTE — Telephone Encounter (Signed)
Requested medication (s) are due for refill today:yes  Requested medication (s) are on the active medication list: yes  Last refill: 12/05/19  #90  Nani Gasser  Future visit scheduled: No  Notes to clinic:  this med states only take if needed. Patient seems to be taking daily?     Requested Prescriptions  Pending Prescriptions Disp Refills   furosemide (LASIX) 20 MG tablet [Pharmacy Med Name: FUROSEMIDE 20 MG TAB] 90 tablet 0    Sig: TAKE 1 TABLET BY MOUTH DAILY ONLY IF NEEDED.      Cardiovascular:  Diuretics - Loop Passed - 03/07/2020  9:33 AM      Passed - K in normal range and within 360 days    Potassium  Date Value Ref Range Status  12/27/2019 3.8 3.5 - 5.1 mmol/L Final          Passed - Ca in normal range and within 360 days    Calcium  Date Value Ref Range Status  12/27/2019 9.0 8.9 - 10.3 mg/dL Final          Passed - Na in normal range and within 360 days    Sodium  Date Value Ref Range Status  12/27/2019 140 135 - 145 mmol/L Final  12/05/2019 141 134 - 144 mmol/L Final          Passed - Cr in normal range and within 360 days    Creatinine, Ser  Date Value Ref Range Status  12/27/2019 0.81 0.44 - 1.00 mg/dL Final   Creatinine, POC  Date Value Ref Range Status  09/11/2017 NA mg/dL Final          Passed - Last BP in normal range    BP Readings from Last 1 Encounters:  02/09/20 (!) 109/58          Passed - Valid encounter within last 6 months    Recent Outpatient Visits           3 weeks ago Aspiration pneumonia of both upper lobes, unspecified aspiration pneumonia type [facility])   [facility] Chrismon, Jodell Cipro, PA-C   3 months ago Urinary frequency   [employer], Jodell Cipro, PA-C   1 year ago Type 2 diabetes mellitus with hyperosmolarity without coma, without long-term current use of insulin (HCC)   [employer], Jodell Cipro, PA-C   1 year ago Type 2 diabetes mellitus with hyperosmolarity  without coma, without long-term current use of insulin [facility])   [facility] Chrismon, Jodell Cipro, PA-C   1 year ago Fatigue, unspecified type   [employer], Jodell Cipro, PA-C       Future Appointments             In 1 month End, Cristal Deer, MD [facility], [facility]

## 2020-03-15 ENCOUNTER — Telehealth: Payer: Self-pay | Admitting: Internal Medicine

## 2020-03-15 ENCOUNTER — Telehealth: Payer: Self-pay

## 2020-03-15 NOTE — Telephone Encounter (Signed)
Send order for evaluation and treatment with PT for strengthening and stability.

## 2020-03-15 NOTE — Telephone Encounter (Signed)
Patient requested physical therapy eval and treatment orders  Please call [facility] to discuss

## 2020-03-15 NOTE — Telephone Encounter (Signed)
Copied from CRM [id number]. Topic: Quick Communication - Home Health Verbal Orders >> Mar 15, 2020 12:02 PM Cuthrell, Pearlean Brownie wrote: Caller/Agency: Magda Paganini from [facility] Pro) on site [place] Callback Number: [phone] Requesting:PT for strengthening and stability request of patients family  Frequency: unknown at this time

## 2020-03-16 NOTE — Telephone Encounter (Signed)
Spoke to [place] at [facility]. States she reached out to PCP as well. States Dr End had recommended PT at last OV; however, that cycle has been completed.  Advised her that Dr End usually prefers PCP to manage the PT/OT type orders. She was agreeable and will check back with PCP.

## 2020-04-09 ENCOUNTER — Other Ambulatory Visit: Payer: Self-pay | Admitting: Internal Medicine

## 2020-04-09 NOTE — Telephone Encounter (Signed)
Rx request sent to pharmacy.  

## 2020-04-12 ENCOUNTER — Encounter: Payer: Self-pay | Admitting: Internal Medicine

## 2020-04-12 LAB — BASIC METABOLIC PANEL
BUN: 25 — AB (ref 4–21)
Chloride: 108 (ref 99–108)
Creatinine: 0.8 (ref <=1.1)
Glucose: 115
Potassium: 4.4 (ref 3.4–5.3)
Sodium: 141 (ref 137–147)

## 2020-04-12 LAB — COMPREHENSIVE METABOLIC PANEL
GFR calc Af Amer: 80
GFR calc non Af Amer: 69

## 2020-04-17 ENCOUNTER — Telehealth: Payer: Self-pay

## 2020-04-17 NOTE — Telephone Encounter (Signed)
Please advise 

## 2020-04-17 NOTE — Telephone Encounter (Signed)
Copied from [place] [id number]. Topic: General - Other >> Apr 17, 2020 12:30 PM Tessa Lerner A wrote: A rep from [facility] called to notify PCP that patient has a sore on the back of their right leg that is not healing Patient has had a sore for roughly two weeks that is tender and not healing but indicates no sign of infection

## 2020-04-24 NOTE — Progress Notes (Addendum)
Subjective:   [name] is a 87 y.o. female who presents for Medicare Annual (Subsequent) preventive examination.  I connected with Vena Austria today by telephone and verified that I am speaking with the correct person using two identifiers. Location patient: home Location provider: work Persons participating in the virtual visit: patient, provider.   I discussed the limitations, risks, security and privacy concerns of performing an evaluation and management service by telephone and the availability of in person appointments. I also discussed with the patient that there may be a patient responsible charge related to this service. The patient expressed understanding and verbally consented to this telephonic visit.    Interactive audio and video telecommunications were attempted between this provider and patient, however failed, due to patient having technical difficulties OR patient did not have access to video capability.  We continued and completed visit with audio only.   Review of Systems    N/A  Cardiac Risk Factors include: advanced age (>28men, >47 women);diabetes mellitus;hypertension;dyslipidemia     Objective:    There were no vitals filed for this visit. There is no height or weight on file to calculate BMI.  Advanced Directives 04/25/2020 12/23/2019 06/13/2019 06/11/2019 06/11/2019 04/26/2019 09/29/2018  Does Patient Have a Medical Advance Directive? Yes No Yes Yes Yes Yes Yes  Type of Paramedic of [place];Living will - [place];Living will - [place];Living will [place];Living will [place];Living will;Out of facility DNR (pink MOST or yellow form)  Does patient want to make changes to medical advance directive? - - No - Patient declined No - Patient declined - - No - Patient declined  Copy of [place] in Chart? No - copy requested - - No - copy  requested No - copy requested No - copy requested -  Would patient like information on creating a medical advance directive? - No - Patient declined No - Patient declined No - Patient declined No - Patient declined - -    Current Medications (verified) Outpatient Encounter Medications as of 04/25/2020  Medication Sig   acetaminophen (TYLENOL) 500 MG tablet Take 2 tablets (1,000 mg total) by mouth 2 (two) times daily as needed (pain.).   apixaban (ELIQUIS) 5 MG TABS tablet Take 1 tablet (5 mg total) by mouth 2 (two) times daily.   Cholecalciferol (VITAMIN D) 2000 UNITS CAPS Take 2,000 Units by mouth daily.    furosemide (LASIX) 20 MG tablet TAKE 1 TABLET BY MOUTH DAILY ONLY IF NEEDED.   glipiZIDE (GLUCOTROL XL) 5 MG 24 hr tablet TALE 1 TABLET BY MOUTH ONCE DAILY WITH BREAKFAST   lisinopril (ZESTRIL) 20 MG tablet TAKE 1 TABLET BY MOUTH DAILY   metFORMIN (GLUCOPHAGE) 500 MG tablet TAKE 1 TABLET BY MOUTH TWICE DAILY WITH MEALS   metoprolol succinate (TOPROL-XL) 25 MG 24 hr tablet TAKE 1 TABLET BY MOUTH ONCE DAILY   Multiple Vitamin (MULTIVITAMIN WITH MINERALS) TABS tablet Take 1 tablet by mouth daily.   Multiple Vitamins-Minerals (HAIR SKIN AND NAILS FORMULA PO) Take 2 tablets by mouth daily at 6 (six) AM.   simvastatin (ZOCOR) 20 MG tablet TAKE ONE TABLET BY MOUTH EVERY NIGHT AT BEDTIME   Clobetasol Propionate 0.05 % shampoo 2- 3 times per week lather on scalp, leave on 20 minutes, wash out. (Patient not taking: Reported on 04/25/2020)   Fluocinolone Acetonide Scalp 0.01 % OIL Apply to scalp in evening, cover with shower cap, wash  off in morning. Use on evenings prior to shampooing next day. (Patient not taking: Reported on 04/25/2020)   mupirocin ointment (BACTROBAN) 2 % Apply topically 2 (two) times daily.   No facility-administered encounter medications on file as of 04/25/2020.    Allergies (verified) Morphine sulfate and Penicillins   History: Past Medical History:  Diagnosis Date    Basal cell carcinoma 03/30/2008   left upper arm/excision   Basal cell carcinoma 03/31/2008   left upper arm, right lower leg   Basal cell carcinoma 01/18/2009   left upper arm   Chronic diarrhea    Collagenous colitis    Diabetes mellitus without complication ([place])    Diverticulitis    Heart murmur    at birth, none since   History of echocardiogram    a. 11/2015: echo showing EF of 60-65% with no WMA. Grade 1 DD and mild MR noted.    Hypertension    PAF (paroxysmal atrial fibrillation) ([place])    a. initially diagnosed in 08/2016 --> started on Eliquis   Syncope    a. initially occurring in Fall 2016 b. Hospitalized in 10/2015 and 11/2015 for recurrent episodes.    Past Surgical History:  Procedure Laterality Date   ABDOMINAL HYSTERECTOMY     BACK SURGERY  08/08/2008   Lumbar discectomy   COLONOSCOPY WITH PROPOFOL     EXCISION/RELEASE BURSA HIP Left 05/03/2019   Procedure: HIP ABDUCTOR REPAIR;  Surgeon: Hessie Knows, MD;  Location: [facility] ORS;  Service: Orthopedics;  Laterality: Left;   INTRAMEDULLARY (IM) NAIL INTERTROCHANTERIC Right 06/13/2019   Procedure: INTRAMEDULLARY (IM) NAIL INTERTROCHANTRIC;  Surgeon: Hessie Knows, MD;  Location: [facility] ORS;  Service: Orthopedics;  Laterality: Right;   NECK SURGERY  1980   Family History  Problem Relation Age of Onset   Hypertension Mother    Diabetes Mother    Lung cancer Father    Social History   Socioeconomic History   Marital status: Widowed    Spouse name: Not on file   Number of children: 2   Years of education: Not on file   Highest education level: Associate degree: academic program  Occupational History   Occupation: retired  Tobacco Use   Smoking status: Former Smoker    Quit date: 03/09/1978    Years since quitting: 42.1   Smokeless tobacco: Never Used  Vaping Use   Vaping Use: Never used  Substance and Sexual Activity   Alcohol use: Yes    Alcohol/week: 0.0 - 1.0 standard drinks    Comment: once a month    Drug use: No   Sexual activity: Not on file  Other Topics Concern   Not on file  Social History Narrative   Not on file   Social Determinants of Health   Financial Resource Strain: Low Risk    Difficulty of Paying Living Expenses: Not hard at all  Food Insecurity: No Food Insecurity   Worried About Charity fundraiser in the Last Year: Never true   [place] in the Last Year: Never true  Transportation Needs: No Transportation Needs   Lack of Transportation (Medical): No   Lack of Transportation (Non-Medical): No  Physical Activity: Inactive   Days of Exercise per Week: 0 days   Minutes of Exercise per Session: 0 min  Stress: No Stress Concern Present   Feeling of Stress : Not at all  Social Connections: Moderately Isolated   Frequency of Communication with Friends and Family: More than three times a  week   Frequency of Social Gatherings with Friends and Family: More than three times a week   Attends Religious Services: More than 4 times per year   Active Member of [employer] or Organizations: No   Attends Archivist Meetings: Never   Marital Status: Widowed    Tobacco Counseling Counseling given: Not Answered   Clinical Intake:  Pre-visit preparation completed: Yes  Pain : No/denies pain     Nutritional Risks: None Diabetes: Yes  How often do you need to have someone help you when you read instructions, pamphlets, or other written materials from your doctor or pharmacy?: 1 - Never  Diabetic? Yes  Nutrition Risk Assessment:  Has the patient had any N/V/D within the last 2 months?  No  Does the patient have any non-healing wounds?  No  Has the patient had any unintentional weight loss or weight gain?  No   Diabetes:  Is the patient diabetic?  Yes  If diabetic, was a CBG obtained today?  No  Did the patient bring in their glucometer from home?  No  How often do you monitor your CBG's? Does not check BS.   Financial Strains and Diabetes  Management:  Are you having any financial strains with the device, your supplies or your medication? No .  Does the patient want to be seen by Chronic Care Management for management of their diabetes?  No  Would the patient like to be referred to a Nutritionist or for Diabetic Management?  No   Diabetic Exams:  Diabetic Eye Exam: Overdue for diabetic eye exam. Pt has been advised about the importance in completing this exam.  Diabetic Foot Exam: Overdue, Pt has been advised about the importance in completing this exam.    Interpreter Needed?: No  Information entered by :: [facility], LPN   Activities of Daily Living In your present state of health, do you have any difficulty performing the following activities: 04/25/2020 12/23/2019  Hearing? N N  Vision? N Y  Difficulty concentrating or making decisions? N N  Walking or climbing stairs? N N  Dressing or bathing? N N  Doing errands, shopping? Y Y  Comment Does not drive. -  Preparing Food and eating ? Y -  Comment Living facility prepares meals. -  Using the Toilet? N -  In the past six months, have you accidently leaked urine? Y -  Comment Wears protection daily. -  Do you have problems with loss of bowel control? N -  Managing your Medications? N -  Managing your Finances? N -  Housekeeping or managing your Housekeeping? Y -  Comment Has assistance from living facility. -  Some recent data might be hidden    Patient Care Team: Chrismon, Vickki Muff, PA-C as PCP - General (Family Medicine) End, Harrell Gave, MD as PCP - Cardiology (Cardiology) Leandrew Koyanagi, MD as Referring Physician (Ophthalmology)  Indicate any recent Medical Services you may have received from other than [facility] providers in the past year (date may be approximate).     Assessment:   This is a routine wellness examination for [place].  Hearing/Vision screen No exam data present  Dietary issues and exercise activities discussed: Current Exercise  Habits: The patient does not participate in regular exercise at present, Exercise limited by: orthopedic condition(s)  Goals      DIET - INCREASE WATER INTAKE     Recommend to drink at least 6-8 8oz glasses of water per day.  Depression Screen PHQ 2/9 Scores 04/25/2020 02/09/2020 12/05/2019 02/09/2018 01/19/2018 01/19/2018 01/11/2018  PHQ - 2 Score 0 0 0 1 0 0 2  PHQ- 9 Score - - 3 - - - -    Fall Risk Fall Risk  04/25/2020 02/09/2020 12/05/2019 02/09/2018 01/19/2018  Falls in the past year? 0 0 1 0 0  Number falls in past yr: 0 0 0 - -  Injury with Fall? 0 0 1 - -  Follow up - Falls evaluation completed - - -    FALL RISK PREVENTION PERTAINING TO THE HOME:  Any stairs in or around the home? No  If so, are there any without handrails? No  Home free of loose throw rugs in walkways, pet beds, electrical cords, etc? Yes  Adequate lighting in your home to reduce risk of falls? Yes   ASSISTIVE DEVICES UTILIZED TO PREVENT FALLS:  Life alert? Yes  Use of a cane, walker or w/c? Yes  Grab bars in the bathroom? Yes  Shower chair or bench in shower? Yes  Elevated toilet seat or a handicapped toilet? Yes    Cognitive Function:     6CIT Screen 04/25/2020 02/09/2018  What Year? 0 points 0 points  What month? 0 points 0 points  What time? 0 points 0 points  Count back from 20 0 points 0 points  Months in reverse 0 points 0 points  Repeat phrase 4 points 4 points  Total Score 4 4    Immunizations Immunization History  Administered Date(s) Administered   Fluad Quad(high Dose 65+) 12/20/2018   Influenza Split 01/01/2009, 01/02/2010, 01/13/2011, 12/22/2011   Influenza, High Dose Seasonal PF 01/18/2014, 01/01/2015, 11/30/2015, 12/01/2016, 01/11/2018, 12/08/2019   Influenza-Unspecified 01/08/2013   PFIZER(Purple Top)SARS-COV-2 Vaccination 04/01/2019, 04/22/2019, 12/05/2019   Pneumococcal Conjugate-13 10/19/2013   Pneumococcal Polysaccharide-23 01/02/2010   Td 11/02/2003, 01/13/2011    Tdap 01/13/2011, 02/28/2015   Zoster 07/14/2007    TDAP status: Up to date  Flu Vaccine status: Up to date  Pneumococcal vaccine status: Up to date  Covid-19 vaccine status: Completed vaccines  Qualifies for Shingles Vaccine? Yes   Zostavax completed Yes   Shingrix Completed?: No.    Education has been provided regarding the importance of this vaccine. Patient has been advised to call insurance company to determine out of pocket expense if they have not yet received this vaccine. Advised may also receive vaccine at local pharmacy or Health Dept. Verbalized acceptance and understanding.  Screening Tests Health Maintenance  Topic Date Due   OPHTHALMOLOGY EXAM  12/08/2018   FOOT EXAM  12/20/2019   HEMOGLOBIN A1C  06/03/2020   COVID-19 Vaccine (4 - Booster for Pfizer series) 06/03/2020   TETANUS/TDAP  02/27/2025   INFLUENZA VACCINE  Completed   DEXA SCAN  Completed   PNA vac Low Risk Adult  Completed    Health Maintenance  Health Maintenance Due  Topic Date Due   OPHTHALMOLOGY EXAM  12/08/2018   FOOT EXAM  12/20/2019    Colorectal cancer screening: No longer required.   Mammogram status: No longer required due to age.  Bone Density status: Completed 01/09/10. Results reflect:Previous DEXA scan was normal. No repeat needed unless advised by a physician.  Lung Cancer Screening: (Low Dose CT Chest recommended if Age 40-80 years, 30 pack-year currently smoking OR have quit w/in 15years.) does not qualify.   Additional Screening:  Vision Screening: Recommended annual ophthalmology exams for early detection of glaucoma and other disorders of the eye. Is the patient up  to date with their annual eye exam?  Yes  Who is the provider or what is the name of the office in which the patient attends annual eye exams? Dr [name] Going If pt is not established with a provider, would they like to be referred to a provider to establish care? No .   Dental Screening: Recommended annual  dental exams for proper oral hygiene  Community Resource Referral / Chronic Care Management: CRR required this visit?  No   CCM required this visit?  No      Plan:     I have personally reviewed and noted the following in the patient's chart:   Medical and social history Use of alcohol, tobacco or illicit drugs  Current medications and supplements Functional ability and status Nutritional status Physical activity Advanced directives List of other physicians Hospitalizations, surgeries, and ER visits in previous 12 months Vitals Screenings to include cognitive, depression, and falls Referrals and appointments  In addition, I have reviewed and discussed with patient certain preventive protocols, quality metrics, and best practice recommendations. A written personalized care plan for preventive services as well as general preventive health recommendations were provided to patient.     Gary Gabrielsen [place], [place]   07/25/9840   Nurse Notes: Pt needs a diabetic foot exam at next in office apt. Pt has an eye exam scheduled for 06/2020.  Reviewed screening note and recommendations of Nurse Health Advisor. Agree with documentation and plan.

## 2020-04-25 ENCOUNTER — Telehealth: Payer: Self-pay

## 2020-04-25 ENCOUNTER — Other Ambulatory Visit: Payer: Self-pay

## 2020-04-25 ENCOUNTER — Ambulatory Visit (INDEPENDENT_AMBULATORY_CARE_PROVIDER_SITE_OTHER): Payer: Medicare Other

## 2020-04-25 DIAGNOSIS — Z0000 Encounter for general adult medical examination without abnormal findings: Secondary | ICD-10-CM

## 2020-04-25 NOTE — Telephone Encounter (Signed)
Copied from [place] [id number]. Topic: General - Other >> Apr 25, 2020  3:26 PM Yvette Rack wrote: Reason for CRM: Pt stated she was speaking with Simona Huh Chrismon's nurse and something happened so she was calling back. Pt requests call back

## 2020-04-25 NOTE — Patient Instructions (Signed)
[name] , Thank you for taking time to come for your Medicare Wellness Visit. I appreciate your ongoing commitment to your health goals. Please review the following plan we discussed and let me know if I can assist you in the future.   Screening recommendations/referrals: Colonoscopy: No longer required.  Mammogram: No longer required.  Bone Density: Previous DEXA scan was normal. No repeat needed unless advised by a physician. Recommended yearly ophthalmology/optometry visit for glaucoma screening and checkup Recommended yearly dental visit for hygiene and checkup  Vaccinations: Influenza vaccine: Done 11/2019 Pneumococcal vaccine: Completed series Tdap vaccine: Up to date, due 02/2025 Shingles vaccine: Shingrix discussed. Please contact your pharmacy for coverage information.     Advanced directives: Advance directive discussed with you today. Please pick up a copy to complete at home and have notarized. Once this is complete please bring a copy in to our office so we can scan it into your chart.  Conditions/risks identified: Recommend to drink at least 6-8 8oz glasses of water per day.  Next appointment: 06/14/20 @ 2:20 PM with [name]. Declined scheduling an AWV for 2023 at this time.    Preventive Care 85 Years and Older, Female Preventive care refers to lifestyle choices and visits with your health care provider that can promote health and wellness. What does preventive care include?  A yearly physical exam. This is also called an annual well check.  Dental exams once or twice a year.  Routine eye exams. Ask your health care provider how often you should have your eyes checked.  Personal lifestyle choices, including:  Daily care of your teeth and gums.  Regular physical activity.  Eating a healthy diet.  Avoiding tobacco and drug use.  Limiting alcohol use.  Practicing safe sex.  Taking low-dose aspirin every day.  Taking vitamin and mineral supplements  as recommended by your health care provider. What happens during an annual well check? The services and screenings done by your health care provider during your annual well check will depend on your age, overall health, lifestyle risk factors, and family history of disease. Counseling  Your health care provider may ask you questions about your:  Alcohol use.  Tobacco use.  Drug use.  Emotional well-being.  Home and relationship well-being.  Sexual activity.  Eating habits.  History of falls.  Memory and ability to understand (cognition).  Work and work Statistician.  Reproductive health. Screening  You may have the following tests or measurements:  Height, weight, and BMI.  Blood pressure.  Lipid and cholesterol levels. These may be checked every 5 years, or more frequently if you are over 45 years old.  Skin check.  Lung cancer screening. You may have this screening every year starting at age 85 if you have a 30-pack-year history of smoking and currently smoke or have quit within the past 15 years.  Fecal occult blood test (FOBT) of the stool. You may have this test every year starting at age 85.  Flexible sigmoidoscopy or colonoscopy. You may have a sigmoidoscopy every 5 years or a colonoscopy every 10 years starting at age 85.  Hepatitis C blood test.  Hepatitis B blood test.  Sexually transmitted disease (STD) testing.  Diabetes screening. This is done by checking your blood sugar (glucose) after you have not eaten for a while (fasting). You may have this done every 1-3 years.  Bone density scan. This is done to screen for osteoporosis. You may have this done starting at age 85.  Mammogram. This may be done every 1-2 years. Talk to your health care provider about how often you should have regular mammograms. Talk with your health care provider about your test results, treatment options, and if necessary, the need for more tests. Vaccines  Your health care  provider may recommend certain vaccines, such as:  Influenza vaccine. This is recommended every year.  Tetanus, diphtheria, and acellular pertussis (Tdap, Td) vaccine. You may need a Td booster every 10 years.  Zoster vaccine. You may need this after age 85.  Pneumococcal 13-valent conjugate (PCV13) vaccine. One dose is recommended after age 85.  Pneumococcal polysaccharide (PPSV23) vaccine. One dose is recommended after age 85. Talk to your health care provider about which screenings and vaccines you need and how often you need them. This information is not intended to replace advice given to you by your health care provider. Make sure you discuss any questions you have with your health care provider. Document Released: 03/23/2015 Document Revised: 11/14/2015 Document Reviewed: 12/26/2014 Elsevier Interactive Patient Education  2017 [employer] Prevention in the Home Falls can cause injuries. They can happen to people of all ages. There are many things you can do to make your home safe and to help prevent falls. What can I do on the outside of my home?  Regularly fix the edges of walkways and driveways and fix any cracks.  Remove anything that might make you trip as you walk through a door, such as a raised step or threshold.  Trim any bushes or trees on the path to your home.  Use bright outdoor lighting.  Clear any walking paths of anything that might make someone trip, such as rocks or tools.  Regularly check to see if handrails are loose or broken. Make sure that both sides of any steps have handrails.  Any raised decks and porches should have guardrails on the edges.  Have any leaves, snow, or ice cleared regularly.  Use sand or salt on walking paths during winter.  Clean up any spills in your garage right away. This includes oil or grease spills. What can I do in the bathroom?  Use night lights.  Install grab bars by the toilet and in the tub and shower. Do  not use towel bars as grab bars.  Use non-skid mats or decals in the tub or shower.  If you need to sit down in the shower, use a plastic, non-slip stool.  Keep the floor dry. Clean up any water that spills on the floor as soon as it happens.  Remove soap buildup in the tub or shower regularly.  Attach bath mats securely with double-sided non-slip rug tape.  Do not have throw rugs and other things on the floor that can make you trip. What can I do in the bedroom?  Use night lights.  Make sure that you have a light by your bed that is easy to reach.  Do not use any sheets or blankets that are too big for your bed. They should not hang down onto the floor.  Have a firm chair that has side arms. You can use this for support while you get dressed.  Do not have throw rugs and other things on the floor that can make you trip. What can I do in the kitchen?  Clean up any spills right away.  Avoid walking on wet floors.  Keep items that you use a lot in easy-to-reach places.  If you need to reach  something above you, use a strong step stool that has a grab bar.  Keep electrical cords out of the way.  Do not use floor polish or wax that makes floors slippery. If you must use wax, use non-skid floor wax.  Do not have throw rugs and other things on the floor that can make you trip. What can I do with my stairs?  Do not leave any items on the stairs.  Make sure that there are handrails on both sides of the stairs and use them. Fix handrails that are broken or loose. Make sure that handrails are as long as the stairways.  Check any carpeting to make sure that it is firmly attached to the stairs. Fix any carpet that is loose or worn.  Avoid having throw rugs at the top or bottom of the stairs. If you do have throw rugs, attach them to the floor with carpet tape.  Make sure that you have a light switch at the top of the stairs and the bottom of the stairs. If you do not have them,  ask someone to add them for you. What else can I do to help prevent falls?  Wear shoes that:  Do not have high heels.  Have rubber bottoms.  Are comfortable and fit you well.  Are closed at the toe. Do not wear sandals.  If you use a stepladder:  Make sure that it is fully opened. Do not climb a closed stepladder.  Make sure that both sides of the stepladder are locked into place.  Ask someone to hold it for you, if possible.  Clearly mark and make sure that you can see:  Any grab bars or handrails.  First and last steps.  Where the edge of each step is.  Use tools that help you move around (mobility aids) if they are needed. These include:  Canes.  Walkers.  Scooters.  Crutches.  Turn on the lights when you go into a dark area. Replace any light bulbs as soon as they burn out.  Set up your furniture so you have a clear path. Avoid moving your furniture around.  If any of your floors are uneven, fix them.  If there are any pets around you, be aware of where they are.  Review your medicines with your doctor. Some medicines can make you feel dizzy. This can increase your chance of falling. Ask your doctor what other things that you can do to help prevent falls. This information is not intended to replace advice given to you by your health care provider. Make sure you discuss any questions you have with your health care provider. Document Released: 12/21/2008 Document Revised: 08/02/2015 Document Reviewed: 03/31/2014 Elsevier Interactive Patient Education  2017 [employer].

## 2020-05-03 ENCOUNTER — Other Ambulatory Visit: Payer: Self-pay | Admitting: Family Medicine

## 2020-05-03 ENCOUNTER — Encounter: Payer: Self-pay | Admitting: Internal Medicine

## 2020-05-03 ENCOUNTER — Ambulatory Visit: Payer: Medicare Other | Admitting: Internal Medicine

## 2020-05-03 ENCOUNTER — Other Ambulatory Visit: Payer: Self-pay

## 2020-05-03 VITALS — BP 136/64 | HR 66 | Ht 61.0 in | Wt 161.0 lb

## 2020-05-03 DIAGNOSIS — I480 Paroxysmal atrial fibrillation: Secondary | ICD-10-CM | POA: Diagnosis not present

## 2020-05-03 DIAGNOSIS — R55 Syncope and collapse: Secondary | ICD-10-CM

## 2020-05-03 DIAGNOSIS — E1169 Type 2 diabetes mellitus with other specified complication: Secondary | ICD-10-CM

## 2020-05-03 DIAGNOSIS — M25471 Effusion, right ankle: Secondary | ICD-10-CM

## 2020-05-03 DIAGNOSIS — I10 Essential (primary) hypertension: Secondary | ICD-10-CM | POA: Diagnosis not present

## 2020-05-03 DIAGNOSIS — E785 Hyperlipidemia, unspecified: Secondary | ICD-10-CM

## 2020-05-03 NOTE — Telephone Encounter (Signed)
This should be seen if still present.

## 2020-05-03 NOTE — Progress Notes (Unsigned)
Follow-up Outpatient Visit Date: 05/03/2020  Primary Care Provider: Margo Common, PA-C [address] [place] [address]  Chief Complaint: Follow-up atrial fibrillation and syncope  HPI:  Ms. Halder is a 87 y.o. female with history of paroxysmal atrial fibrillation, hypertension, hyperlipidemia, type 2 diabetes mellitus, and recurrent syncope thought to be vasovagal in nature, who presents for follow-up of atrial fibrillation and syncope.  I last saw her in 01/2020, shortly after having been hospitalized with community-acquired pneumonia and sepsis.  She was noted to be in rate controlled atrial fibrillation at that time.  She was back to baseline at the time of our visit.  No medication changes or additional testing were pursued.  Today, Ms. Wartman reports that she has been doing fairly well.  She is most concerned about intermittent swelling of the right foot just below the site of injury several years ago.  It seems to resolve when she takes furosemide regularly.  She has difficulty putting on compression stockings and therefore does not use them on a regular basis.  She denies pain in her legs with ambulation.  She also denies chest pain, shortness of breath, palpitations, lightheadedness, and syncope.  She remains compliant with her medications.  She has occasional brief nosebleeds.  She has not had any further falls.  --------------------------------------------------------------------------------------------------  Past Medical History:  Diagnosis Date  . Basal cell carcinoma 03/30/2008   left upper arm/excision  . Basal cell carcinoma 03/31/2008   left upper arm, right lower leg  . Basal cell carcinoma 01/18/2009   left upper arm  . Chronic diarrhea   . Collagenous colitis   . Diabetes mellitus without complication ([place])   . Diverticulitis   . Heart murmur    at birth, none since  . History of echocardiogram    a. 11/2015: echo showing EF of 60-65% with no WMA.  Grade 1 DD and mild MR noted.   . Hypertension   . PAF (paroxysmal atrial fibrillation) ([place])    a. initially diagnosed in 08/2016 --> started on Eliquis  . Syncope    a. initially occurring in Fall 2016 b. Hospitalized in 10/2015 and 11/2015 for recurrent episodes.    Past Surgical History:  Procedure Laterality Date  . ABDOMINAL HYSTERECTOMY    . BACK SURGERY  08/08/2008   Lumbar discectomy  . COLONOSCOPY WITH PROPOFOL    . EXCISION/RELEASE BURSA HIP Left 05/03/2019   Procedure: HIP ABDUCTOR REPAIR;  Surgeon: Hessie Knows, MD;  Location: [facility] ORS;  Service: Orthopedics;  Laterality: Left;  . INTRAMEDULLARY (IM) NAIL INTERTROCHANTERIC Right 06/13/2019   Procedure: INTRAMEDULLARY (IM) NAIL INTERTROCHANTRIC;  Surgeon: Hessie Knows, MD;  Location: [facility] ORS;  Service: Orthopedics;  Laterality: Right;  . NECK SURGERY  1980     Current Meds  Medication Sig  . acetaminophen (TYLENOL) 500 MG tablet Take 2 tablets (1,000 mg total) by mouth 2 (two) times daily as needed (pain.).  Marland Kitchen apixaban (ELIQUIS) 5 MG TABS tablet Take 1 tablet (5 mg total) by mouth 2 (two) times daily.  . Cholecalciferol (VITAMIN D) 2000 UNITS CAPS Take 2,000 Units by mouth daily.   . Clobetasol Propionate 0.05 % shampoo 2- 3 times per week lather on scalp, leave on 20 minutes, wash out.  Marland Kitchen FLUOCINOLONE ACETONIDE SCALP EX Apply 0.1 % topically daily as needed.  . furosemide (LASIX) 20 MG tablet TAKE 1 TABLET BY MOUTH DAILY ONLY IF NEEDED.  Marland Kitchen glipiZIDE (GLUCOTROL XL) 5 MG 24 hr tablet TALE 1 TABLET BY  MOUTH ONCE DAILY WITH BREAKFAST  . lisinopril (ZESTRIL) 20 MG tablet TAKE 1 TABLET BY MOUTH DAILY  . metFORMIN (GLUCOPHAGE) 500 MG tablet TAKE 1 TABLET BY MOUTH TWICE DAILY WITH MEALS  . metoprolol succinate (TOPROL-XL) 25 MG 24 hr tablet TAKE 1 TABLET BY MOUTH ONCE DAILY  . Multiple Vitamin (MULTIVITAMIN WITH MINERALS) TABS tablet Take 1 tablet by mouth daily.  . Multiple Vitamins-Minerals (HAIR SKIN AND NAILS FORMULA PO)  Take 2 tablets by mouth daily at 6 (six) AM.  . mupirocin ointment (BACTROBAN) 2 % Apply topically 2 (two) times daily.  . [DISCONTINUED] simvastatin (ZOCOR) 20 MG tablet TAKE ONE TABLET BY MOUTH EVERY NIGHT AT BEDTIME    Allergies: Morphine sulfate and Penicillins  Social History   Tobacco Use  . Smoking status: Former Smoker    Quit date: 03/09/1978    Years since quitting: 42.1  . Smokeless tobacco: Never Used  Vaping Use  . Vaping Use: Never used  Substance Use Topics  . Alcohol use: Yes    Alcohol/week: 0.0 - 1.0 standard drinks    Comment: once a month  . Drug use: No    Family History  Problem Relation Age of Onset  . Hypertension Mother   . Diabetes Mother   . Lung cancer Father     Review of Systems: A 12-system review of systems was performed and was negative except as noted in the HPI.  --------------------------------------------------------------------------------------------------  Physical Exam: BP 136/64 (BP Location: Left Arm, Patient Position: Sitting, Cuff Size: Normal)   Pulse 66   Ht 5\' 1"  (1.549 m)   Wt 161 lb (73 kg)   SpO2 97%   BMI 30.42 kg/m   General:  NAD.  Accompanied by her daughter. Neck: No JVD or HJR. Lungs: Clear to auscultation bilaterally without wheezes or crackles. Heart: Regular rate and rhythm without murmurs, rubs, or gallops. Abdomen: Soft, nontender, nondistended. Extremities: Trace bilateral ankle edema noted.  Well-healed scar along the right anterolateral calf is present.  Varicose veins are noted in both calves/feet.  EKG: Normal sinus rhythm without abnormality.  Lab Results  Component Value Date   WBC 7.0 12/27/2019   HGB 10.2 (L) 12/27/2019   HCT 31.0 (L) 12/27/2019   MCV 90.4 12/27/2019   PLT 238 12/27/2019    Lab Results  Component Value Date   NA 140 12/27/2019   K 3.8 12/27/2019   CL 106 12/27/2019   CO2 24 12/27/2019   BUN 13 12/27/2019   CREATININE 0.81 12/27/2019   GLUCOSE 206 (H) 12/27/2019    ALT 71 (H) 12/23/2019    Lab Results  Component Value Date   CHOL 149 12/21/2018   HDL 52 12/21/2018   LDLCALC 69 12/21/2018   TRIG 163 (H) 12/21/2018   CHOLHDL 2.9 12/21/2018    --------------------------------------------------------------------------------------------------  ASSESSMENT AND PLAN: Paroxysmal atrial fibrillation: No evidence of recurrence.  We will continue indefinite anticoagulation with apixaban 5 mg twice daily based on age, weight, and creatinine.  We will request recent labs that were drawn at [facility] to ensure stable renal function and blood counts.  Continue metoprolol succinate 25 mg daily as well.  Right ankle edema: Most likely due to venous insufficiency.  Pedal pulses are 2+.  Lower extremity arterial studies in 2018 were also normal.  I suspect venous insufficiency is the primary culprit for her edema.  We discussed use of compression stockings, though Ms. Lubrano believes it would be difficult for her to wear these on a  regular basis.  I think it is reasonable for her to continue taking furosemide 20 mg daily as needed for edema.  Leg elevation and sodium restriction were encouraged.  Syncope: No recurrence.  Defer additional work-up at this time.  Hypertension: Blood pressure well controlled.  No medication changes today.  Hyperlipidemia associated with type 2 diabetes: LDL well controlled on last check in 12/2019.  Continue current dose of simvastatin.  Follow-up: Return to clinic in 6 months.  Nelva Bush, MD 05/04/2020 9:27 AM

## 2020-05-03 NOTE — Patient Instructions (Signed)

## 2020-05-04 ENCOUNTER — Encounter: Payer: Self-pay | Admitting: Internal Medicine

## 2020-05-04 DIAGNOSIS — E1169 Type 2 diabetes mellitus with other specified complication: Secondary | ICD-10-CM | POA: Insufficient documentation

## 2020-05-07 NOTE — Telephone Encounter (Signed)
This is has been resolved. Wound has almost completely healed.

## 2020-05-16 ENCOUNTER — Encounter: Payer: Self-pay | Admitting: Dermatology

## 2020-05-16 ENCOUNTER — Ambulatory Visit: Payer: Medicare Other | Admitting: Dermatology

## 2020-05-16 ENCOUNTER — Other Ambulatory Visit: Payer: Self-pay

## 2020-05-16 DIAGNOSIS — L578 Other skin changes due to chronic exposure to nonionizing radiation: Secondary | ICD-10-CM

## 2020-05-16 DIAGNOSIS — C44321 Squamous cell carcinoma of skin of nose: Secondary | ICD-10-CM

## 2020-05-16 DIAGNOSIS — L570 Actinic keratosis: Secondary | ICD-10-CM

## 2020-05-16 DIAGNOSIS — C4492 Squamous cell carcinoma of skin, unspecified: Secondary | ICD-10-CM

## 2020-05-16 DIAGNOSIS — D492 Neoplasm of unspecified behavior of bone, soft tissue, and skin: Secondary | ICD-10-CM

## 2020-05-16 HISTORY — DX: Squamous cell carcinoma of skin, unspecified: C44.92

## 2020-05-16 NOTE — Progress Notes (Signed)
   Follow-Up Visit   Subjective  [name] is a 87 y.o. female who presents for the following: Skin Problem (Lesion at left nostril area. Dur: few weeks. Patient picks at area. Will not heal. Bleeds when picked.).  She has a h/o multiple Aks treated on her nose last visit.  She also has a lesion on her R arm that she picks at.    The following portions of the chart were reviewed this encounter and updated as appropriate:      Review of Systems: No other skin or systemic complaints except as noted in HPI or Assessment and Plan.  Objective  Well appearing patient in no apparent distress; mood and affect are within normal limits.  A focused examination was performed including face, arms. Relevant physical exam findings are noted in the Assessment and Plan.  Objective  left nasal tip anterior to ala: 12mm pink crusted papule     Objective  Left paranasal x2, nasal tip x2, right glabella x3, right forearm x1 (8): Erythematous thin papules/macules with gritty scale. Hyperkeratotic papule at right forearm.  Assessment & Plan  Neoplasm of skin left nasal tip anterior to ala  Skin / nail biopsy Type of biopsy: tangential   Informed consent: discussed and consent obtained   Anesthesia: the lesion was anesthetized in a standard fashion   Anesthesia comment:  Area prepped with alcohol Anesthetic:  1% lidocaine w/ epinephrine 1-100,000 buffered w/ 8.4% NaHCO3 Instrument used: flexible razor blade   Hemostasis achieved with: pressure, aluminum chloride and electrodesiccation   Outcome: patient tolerated procedure well   Post-procedure details: wound care instructions given   Post-procedure details comment:  Ointment and small bandage applied  Specimen 1 - Surgical pathology Differential Diagnosis: R/O BCC vs SCC  Check Margins: No 56mm pink crusted papule  R/O BCC vs SCC  EDC vrs Mohs vrs radiation if positive.  Pt prefers radiation.  AK (actinic keratosis) (8) Left  paranasal x2, nasal tip x2, right glabella x3, right forearm x1  HyAk at R forearm Recheck on f/up  Destruction of lesion - Left paranasal x2, nasal tip x2, right glabella x3, right forearm x1  Destruction method: cryotherapy   Informed consent: discussed and consent obtained   Lesion destroyed using liquid nitrogen: Yes   Region frozen until ice ball extended beyond lesion: Yes   Outcome: patient tolerated procedure well with no complications   Post-procedure details: wound care instructions given    Actinic Damage - chronic, secondary to cumulative UV radiation exposure/sun exposure over time - diffuse scaly erythematous macules with underlying dyspigmentation - Recommend daily broad spectrum sunscreen SPF 30+ to sun-exposed areas, reapply every 2 hours as needed.  - Recommend staying in the shade or wearing long sleeves, sun glasses (UVA+UVB protection) and wide brim hats (4-inch brim around the entire circumference of the hat). - Call for new or changing lesions.  Return pending biopsy.   I, Emelia Salisbury, CMA, am acting as scribe for Brendolyn Patty, MD.  Documentation: I have reviewed the above documentation for accuracy and completeness, and I agree with the above.  Brendolyn Patty MD

## 2020-05-16 NOTE — Patient Instructions (Signed)
Cryotherapy Aftercare  . Wash gently with soap and water everyday.   . Apply Vaseline and Band-Aid daily until healed.  Prior to procedure, discussed risks of blister formation, small wound, skin dyspigmentation, or rare scar following cryotherapy.    Wound Care Instructions  1. Cleanse wound gently with soap and water once a day then pat dry with clean gauze. Apply a thing coat of Petrolatum (petroleum jelly, "Vaseline") over the wound (unless you have an allergy to this). We recommend that you use a new, sterile tube of Vaseline. Do not pick or remove scabs. Do not remove the yellow or white "healing tissue" from the base of the wound.  2. Cover the wound with fresh, clean, nonstick gauze and secure with paper tape. You may use Band-Aids in place of gauze and tape if the would is small enough, but would recommend trimming much of the tape off as there is often too much. Sometimes Band-Aids can irritate the skin.  3. You should call the office for your biopsy report after 1 week if you have not already been contacted.  4. If you experience any problems, such as abnormal amounts of bleeding, swelling, significant bruising, significant pain, or evidence of infection, please call the office immediately.  5. FOR ADULT SURGERY PATIENTS: If you need something for pain relief you may take 1 extra strength Tylenol (acetaminophen) AND 2 Ibuprofen (200mg each) together every 4 hours as needed for pain. (do not take these if you are allergic to them or if you have a reason you should not take them.) Typically, you may only need pain medication for 1 to 3 days.     

## 2020-05-21 ENCOUNTER — Telehealth: Payer: Self-pay

## 2020-05-21 DIAGNOSIS — C4492 Squamous cell carcinoma of skin, unspecified: Secondary | ICD-10-CM

## 2020-05-21 NOTE — Telephone Encounter (Signed)
-----   Message from Brendolyn Patty, MD sent at 05/21/2020 12:11 PM EDT ----- Skin , left nasal tip anterior to ala WELL DIFFERENTIATED SQUAMOUS CELL CARCINOMA, ACANTHOLYTIC (ADENOID) VARIANT  SCC skin cancer.  We discussed treatment options during visit (Mohs vrs EDC vrs radiation).  She is not interested in doing Mohs surgery.  I think the best cure and cosmetic result would be with radiation treatment at [facility].  I can also treat with EDC in office, but would leave a scar and has higher risk of recurrence.  Please refer if she chooses radiation.

## 2020-05-21 NOTE — Telephone Encounter (Signed)
Advised [name] and her daughter of bx results and discussed treatment options.  [name] and her daughter would like to have MOHs surgery.  I advised I will send referral to Dr. Geralynn Ochs office and Dr. Jonathon Jordan office would call them and set up appt./sh

## 2020-06-08 LAB — HM DIABETES EYE EXAM

## 2020-06-14 ENCOUNTER — Ambulatory Visit: Payer: Medicare Other | Admitting: Family Medicine

## 2020-06-20 ENCOUNTER — Other Ambulatory Visit: Payer: Self-pay | Admitting: Internal Medicine

## 2020-06-21 NOTE — Telephone Encounter (Signed)
Prescription refill request for Eliquis received. Indication: PAF Last office visit: 05/03/20 Scr: 0.78 on 04/12/20 Age: 87 Weight: 73kg  Based on above findings Eliquis 5mg  twice daily is the appropriate dose.  Refill approved.

## 2020-06-21 NOTE — Telephone Encounter (Signed)
Refill Request.  

## 2020-07-17 ENCOUNTER — Ambulatory Visit: Payer: Medicare Other | Admitting: Family Medicine

## 2020-07-19 ENCOUNTER — Ambulatory Visit (INDEPENDENT_AMBULATORY_CARE_PROVIDER_SITE_OTHER): Payer: Medicare Other | Admitting: Family Medicine

## 2020-07-19 ENCOUNTER — Other Ambulatory Visit: Payer: Self-pay

## 2020-07-19 ENCOUNTER — Encounter: Payer: Self-pay | Admitting: Family Medicine

## 2020-07-19 VITALS — BP 90/49 | HR 58 | Temp 97.7°F | Resp 16 | Wt 157.1 lb

## 2020-07-19 DIAGNOSIS — E785 Hyperlipidemia, unspecified: Secondary | ICD-10-CM

## 2020-07-19 DIAGNOSIS — E1169 Type 2 diabetes mellitus with other specified complication: Secondary | ICD-10-CM

## 2020-07-19 DIAGNOSIS — D692 Other nonthrombocytopenic purpura: Secondary | ICD-10-CM

## 2020-07-19 DIAGNOSIS — I480 Paroxysmal atrial fibrillation: Secondary | ICD-10-CM

## 2020-07-19 DIAGNOSIS — I152 Hypertension secondary to endocrine disorders: Secondary | ICD-10-CM

## 2020-07-19 DIAGNOSIS — E1159 Type 2 diabetes mellitus with other circulatory complications: Secondary | ICD-10-CM | POA: Diagnosis not present

## 2020-07-19 NOTE — Assessment & Plan Note (Signed)
Slightly low today, but asymptomatic Typically with well-controlled blood pressure She would prefer not to decrease her medications today, so she we will continue to monitor Continue current medications Recheck metabolic panel in 3 months Follow-up in 3 months

## 2020-07-19 NOTE — Assessment & Plan Note (Signed)
Chronic and stable.   

## 2020-07-19 NOTE — Assessment & Plan Note (Signed)
In A. fib today, but rate controlled On Eliquis Followed by cardiology Recheck CBC every 6 months

## 2020-07-19 NOTE — Progress Notes (Signed)
Established patient visit   [name]   DOB: [birth year]   87 y.o. Female  MRN: [id number] Visit Date: 07/19/2020  Today's healthcare provider: Lavon Paganini, MD   Chief Complaint  Patient presents with  . Diabetes  . Hypertension  . Hyperlipidemia  . Diarrhea    Patient would like to discuss today medication to help with loose stools, patient reports that she believes her medication could be causing looser stools and states that she has been suffering with  loose stools for the past month.   Subjective    Diabetes Pertinent negatives for hypoglycemia include no dizziness or headaches. Pertinent negatives for diabetes include no chest pain, no fatigue and no weakness.  Hypertension Pertinent negatives include no chest pain, headaches, neck pain, palpitations or shortness of breath.  Hyperlipidemia Pertinent negatives include no chest pain or shortness of breath.  Diarrhea  Pertinent negatives include no abdominal pain, chills, coughing, fever, headaches or vomiting.   HPI    Diarrhea     Additional comments: Patient would like to discuss today medication to help with loose stools, patient reports that she believes her medication could be causing looser stools and states that she has been suffering with  loose stools for the past month.       Last edited by Minette Headland, CMA on 07/19/2020 10:39 AM. (History)     Diarrhea She is experiencing diarrhea that comes fast. She says it seems like other foods are associated with the diarrhea. She says its been happening for the past month and its coming fast and loose. She is interested in medication because these symptoms make her feel embarrassed. She took 2 imodium and she ended up taking a third to improve her symptoms because she didn't want to have an episode happen during the night. She says she can clean some but her mobility is limited. She does monitor her diet and she has stopped eating ice cream  and cakes.    Blood Pressure She denies experiencing dizziness from her hypotension. She has never been hypertensive. She doesn't want to change her medications unless there is a new onset of symptoms.   Lab Work She said that she received lab work at [facility] and she believes that is was done after the first of the year.   Diabetes Mellitus Type II, follow-up  Lab Results  Component Value Date   HGBA1C 7.6 (H) 12/05/2019   HGBA1C 7.0 (H) 06/11/2019   HGBA1C 7.0 (H) 12/21/2018   Last seen for diabetes 5 months ago.  Management since then includes continuing the same treatment. She reports excellent compliance with treatment. She is not having side effects.  Home blood sugar records: not being checked  Episodes of hypoglycemia? No    Current insulin regiment: none Most Recent Eye Exam: 06/08/20  --------------------------------------------------------------------------------------------------- Hypertension, follow-up  BP Readings from Last 3 Encounters:  07/19/20 (!) 90/49  05/03/20 136/64  02/09/20 (!) 109/58   Wt Readings from Last 3 Encounters:  07/19/20 157 lb 1.6 oz (71.3 kg)  05/03/20 161 lb (73 kg)  02/09/20 157 lb (71.2 kg)     She was last seen for hypertension 5 months ago.  BP at that visit was 109/58. Management since that visit includes none. She reports excellent compliance with treatment. She is not having side effects.  She is not exercising. She is adherent to low salt diet.   Outside blood pressures are being checked once a month  at [facility].  She does not smoke.  Use of agents associated with hypertension: none.   --------------------------------------------------------------------------------------------------- Lipid/Cholesterol, follow-up  Last Lipid Panel: Lab Results  Component Value Date   CHOL 149 12/21/2018   LDLCALC 69 12/21/2018   HDL 52 12/21/2018   TRIG 163 (H) 12/21/2018    She was last seen for this 5 months ago.   Management since that visit includes none.  She reports excellent compliance with treatment. She is not having side effects.   Symptoms: No appetite changes No foot ulcerations  No chest pain No chest pressure/discomfort  No dyspnea No orthopnea  Yes fatigue Yes lower extremity edema  No palpitations No paroxysmal nocturnal dyspnea  No nausea No numbness or tingling of extremity  No polydipsia No polyuria  No speech difficulty No syncope   She is following a Regular diet. Current exercise: no regular exercise  Last metabolic panel Lab Results  Component Value Date   GLUCOSE 206 (H) 12/27/2019   NA 140 12/27/2019   K 3.8 12/27/2019   BUN 13 12/27/2019   CREATININE 0.81 12/27/2019   GFRNONAA >60 12/27/2019   GFRAA 77 12/05/2019   CALCIUM 9.0 12/27/2019   AST 32 12/23/2019   ALT 71 (H) 12/23/2019   The ASCVD Risk score (Goff DC Jr., et al., 2013) failed to calculate for the following reasons:   The 2013 ASCVD risk score is only valid for ages 67 to 74  ---------------------------------------------------------------------------------------------------  Patient Active Problem List   Diagnosis Date Noted  . Hyperlipidemia associated with type 2 diabetes mellitus ([place]) 05/04/2020  . Pressure injury of skin 12/24/2019  . Abnormal echocardiogram 10/12/2019  . Hip pain 12/23/2018  . Lower extremity edema 12/24/2016  . Current use of long term anticoagulation 09/17/2016  . Paroxysmal atrial fibrillation ([place]) 08/13/2016  . Seizure ([place])   . Senile purpura ([place]) 01/01/2015  . Arthritis 07/12/2014  . Basal cell carcinoma 07/12/2014  . CC (collagenous colitis) 07/12/2014  . Hypertension associated with diabetes ([place]) 07/12/2014  . Adiposity 07/12/2014  . Acne erythematosa 07/12/2014  . Diabetes mellitus, type 2 ([place]) 07/12/2014  . 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 Avitaminosis D 07/12/2014  . H/O malignant neoplasm of skin 10/10/2011   Past Medical History:  Diagnosis Date   . Basal cell carcinoma 03/30/2008   left upper arm/excision  . Basal cell carcinoma 03/31/2008   left upper arm, right lower leg  . Basal cell carcinoma 01/18/2009   left upper arm  . Chronic diarrhea   . Collagenous colitis   . Diabetes mellitus without complication ([place])   . Diverticulitis   . Heart murmur    at birth, none since  . History of echocardiogram    a. 11/2015: echo showing EF of 60-65% with no WMA. Grade 1 DD and mild MR noted.   . Hypertension   . Intertrochanteric fracture of right femur ([place]) 06/11/2019  . PAF (paroxysmal atrial fibrillation) ([place])    a. initially diagnosed in 08/2016 --> started on Eliquis  . Squamous cell carcinoma of skin 05/16/2020   Left nasal tip anterior to ala, refer to [facility]  . Syncope    a. initially occurring in Fall 2016 b. Hospitalized in 10/2015 and 11/2015 for recurrent episodes.    Allergies  Allergen Reactions  . Morphine Sulfate Other (See Comments)    BP bottoms out  . Penicillins Diarrhea    Has patient had a PCN reaction causing immediate rash, facial/tongue/throat swelling, SOB or lightheadedness with hypotension: Yes  Has patient had a PCN reaction causing severe rash involving mucus membranes or skin necrosis: Yes Has patient had a PCN reaction that required hospitalization No Has patient had a PCN reaction occurring within the last 10 years: No If all of the above answers are "NO", then may proceed with Cephalosporin use.       Medications: Outpatient Medications Prior to Visit  Medication Sig  . acetaminophen (TYLENOL) 500 MG tablet Take 2 tablets (1,000 mg total) by mouth 2 (two) times daily as needed (pain.).  Marland Kitchen Cholecalciferol (VITAMIN D) 2000 UNITS CAPS Take 2,000 Units by mouth daily.   . Clobetasol Propionate 0.05 % shampoo 2- 3 times per week lather on scalp, leave on 20 minutes, wash out.  Marland Kitchen ELIQUIS 5 MG TABS tablet TAKE 1 TABLET BY MOUTH TWICE DAILY  . FLUOCINOLONE ACETONIDE SCALP EX Apply 0.1 % topically  daily as needed.  . furosemide (LASIX) 20 MG tablet TAKE 1 TABLET BY MOUTH DAILY ONLY IF NEEDED.  Marland Kitchen glipiZIDE (GLUCOTROL XL) 5 MG 24 hr tablet TALE 1 TABLET BY MOUTH ONCE DAILY WITH BREAKFAST  . lisinopril (ZESTRIL) 20 MG tablet TAKE 1 TABLET BY MOUTH DAILY  . metoprolol succinate (TOPROL-XL) 25 MG 24 hr tablet TAKE 1 TABLET BY MOUTH ONCE DAILY  . Multiple Vitamin (MULTIVITAMIN WITH MINERALS) TABS tablet Take 1 tablet by mouth daily.  . Multiple Vitamins-Minerals (HAIR SKIN AND NAILS FORMULA PO) Take 2 tablets by mouth daily at 6 (six) AM.  . mupirocin ointment (BACTROBAN) 2 % Apply topically 2 (two) times daily.  . simvastatin (ZOCOR) 20 MG tablet TAKE 1 TABLET BY MOUTH AT BEDTIME  . [DISCONTINUED] metFORMIN (GLUCOPHAGE) 500 MG tablet TAKE 1 TABLET BY MOUTH TWICE DAILY WITH MEALS   No facility-administered medications prior to visit.    Review of Systems  Constitutional: Negative for chills, fatigue and fever.  HENT: Negative for ear pain, nosebleeds, sinus pressure, sinus pain and sore throat.   Eyes: Negative for pain.  Respiratory: Negative for cough, chest tightness, shortness of breath and wheezing.   Cardiovascular: Negative for chest pain and palpitations.  Gastrointestinal: Positive for diarrhea. Negative for abdominal pain, blood in stool, constipation, nausea and vomiting.  Genitourinary: Negative for dysuria, flank pain, frequency, pelvic pain and urgency.  Musculoskeletal: Negative for back pain, neck pain and neck stiffness.  Neurological: Negative for dizziness, syncope, weakness, light-headedness, numbness and headaches.       Objective    BP (!) 90/49   Pulse (!) 58   Temp 97.7 F (36.5 C) (Oral)   Resp 16   Wt 157 lb 1.6 oz (71.3 kg)   BMI 29.68 kg/m     Physical Exam Vitals reviewed.  Constitutional:      General: She is not in acute distress.    Appearance: Normal appearance. She is well-developed. She is not diaphoretic.  HENT:     Head: Normocephalic  and atraumatic.  Eyes:     General: No scleral icterus.    Conjunctiva/sclera: Conjunctivae normal.  Neck:     Thyroid: No thyromegaly.  Cardiovascular:     Rate and Rhythm: Normal rate. Rhythm irregularly irregular.     Pulses: Normal pulses.     Heart sounds: Normal heart sounds. No murmur heard.   Pulmonary:     Effort: Pulmonary effort is normal. No respiratory distress.     Breath sounds: Normal breath sounds. No wheezing, rhonchi or rales.  Musculoskeletal:     Cervical back: Neck supple.  Right lower leg: No edema.     Left lower leg: No edema.  Lymphadenopathy:     Cervical: No cervical adenopathy.  Skin:    General: Skin is warm and dry.     Findings: No rash.  Neurological:     Mental Status: She is alert and oriented to person, place, and time. Mental status is at baseline.  Psychiatric:        Mood and Affect: Mood normal.        Behavior: Behavior normal.     No results found for any visits on 07/19/20.  Assessment & Plan     Problem List Items Addressed This Visit      Cardiovascular and Mediastinum   Hypertension associated with diabetes ([place])    Slightly low today, but asymptomatic Typically with well-controlled blood pressure She would prefer not to decrease her medications today, so she we will continue to monitor Continue current medications Recheck metabolic panel in 3 months Follow-up in 3 months      Relevant Orders   Comprehensive metabolic panel   Senile purpura (HCC)    Chronic and stable      Paroxysmal atrial fibrillation (HCC)    In A. fib today, but rate controlled On Eliquis Followed by cardiology Recheck CBC every 6 months      Relevant Orders   CBC w/Diff/Platelet     Endocrine   Diabetes mellitus, type 2 ([place]) - Primary    Well-controlled with last A1c 7.1 in 2/22 Higher goal A1c given advanced age and comorbidities Continue current medications, except stop metformin as this may be contributing to her loose  stools Recheck A1c in 3 months and consider replacing metformin with a different medication class or metformin XR      Relevant Orders   Hemoglobin A1c   Hyperlipidemia associated with type 2 diabetes mellitus ([place])    Previously well controlled Continue statin Repeat FLP and CMP in 3 months       Relevant Orders   Lipid panel   Comprehensive metabolic panel      Return in about 3 months (around 10/19/2020) for chronic disease f/u.       I,Essence Turner,acting as a Education administrator for Lavon Paganini, MD.,have documented all relevant documentation on the behalf of Lavon Paganini, MD,as directed by  Lavon Paganini, MD while in the presence of Lavon Paganini, MD.  I, Lavon Paganini, MD, have reviewed all documentation for this visit. The documentation on 07/19/20 for the exam, diagnosis, procedures, and orders are all accurate and complete.   Noreen Mackintosh, Dionne Bucy, MD, MPH [facility]

## 2020-07-19 NOTE — Assessment & Plan Note (Signed)
Well-controlled with last A1c 7.1 in 2/22 Higher goal A1c given advanced age and comorbidities Continue current medications, except stop metformin as this may be contributing to her loose stools Recheck A1c in 3 months and consider replacing metformin with a different medication class or metformin XR

## 2020-07-19 NOTE — Assessment & Plan Note (Signed)
Previously well controlled Continue statin Repeat FLP and CMP in 3 months

## 2020-07-23 LAB — CBC AND DIFFERENTIAL
HCT: 33 — AB (ref 36–46)
Hemoglobin: 10.9 — AB (ref 12.0–16.0)

## 2020-07-23 LAB — BASIC METABOLIC PANEL
BUN: 21 (ref 4–21)
Chloride: 108 (ref 99–108)
Creatinine: 0.9 (ref <=1.1)
Glucose: 144
Potassium: 4.5 (ref 3.4–5.3)
Sodium: 138 (ref 137–147)

## 2020-07-23 LAB — COMPREHENSIVE METABOLIC PANEL
GFR calc non Af Amer: 57
Globulin: 2.5

## 2020-07-23 LAB — HEMOGLOBIN A1C: Hemoglobin A1C: 7

## 2020-07-23 LAB — HEPATIC FUNCTION PANEL
ALT: 12 (ref 7–35)
AST: 11 — AB (ref 13–35)

## 2020-07-23 LAB — CBC: RBC: 3.66 — AB (ref 3.87–5.11)

## 2020-07-23 LAB — LIPID PANEL
Cholesterol: 144 (ref 0–200)
LDL Cholesterol: 84
Triglycerides: 118 (ref 40–160)

## 2020-07-27 ENCOUNTER — Encounter: Payer: Self-pay | Admitting: Family Medicine

## 2020-08-21 ENCOUNTER — Other Ambulatory Visit: Payer: Self-pay | Admitting: Family Medicine

## 2020-08-21 DIAGNOSIS — E119 Type 2 diabetes mellitus without complications: Secondary | ICD-10-CM

## 2020-10-11 LAB — LIPID PANEL
Cholesterol: 139 (ref 0–200)
HDL: 47 (ref 35–70)
LDL Cholesterol: 74
Triglycerides: 99 (ref 40–160)

## 2020-10-11 LAB — HEPATIC FUNCTION PANEL
ALT: 14 (ref 7–35)
AST: 11 — AB (ref 13–35)
Alkaline Phosphatase: 43 (ref 25–125)
Bilirubin, Total: 0.4

## 2020-10-11 LAB — BASIC METABOLIC PANEL
BUN: 27 — AB (ref 4–21)
CO2: 28 — AB (ref 13–22)
Chloride: 107 (ref 99–108)
Creatinine: 0.9 (ref 0.5–1.1)
Glucose: 146
Potassium: 4.1 (ref 3.4–5.3)
Sodium: 140 (ref 137–147)

## 2020-10-11 LAB — CBC: RBC: 3.75 — AB (ref 3.87–5.11)

## 2020-10-11 LAB — CBC AND DIFFERENTIAL
HCT: 34 — AB (ref 36–46)
Hemoglobin: 11.2 — AB (ref 12.0–16.0)
Neutrophils Absolute: 1701
Platelets: 206 (ref 150–399)
WBC: 6.3

## 2020-10-11 LAB — HEMOGLOBIN A1C: Hemoglobin A1C: 7.5

## 2020-10-11 LAB — COMPREHENSIVE METABOLIC PANEL
Albumin: 3.4 — AB (ref 3.5–5.0)
Calcium: 8.7 (ref 8.7–10.7)
GFR calc non Af Amer: 63

## 2020-10-19 ENCOUNTER — Encounter: Payer: Self-pay | Admitting: Family Medicine

## 2020-10-19 ENCOUNTER — Ambulatory Visit (INDEPENDENT_AMBULATORY_CARE_PROVIDER_SITE_OTHER): Payer: Medicare Other | Admitting: Family Medicine

## 2020-10-19 ENCOUNTER — Other Ambulatory Visit: Payer: Self-pay

## 2020-10-19 VITALS — BP 150/57 | HR 58 | Temp 97.7°F | Resp 16 | Ht 60.0 in | Wt 167.3 lb

## 2020-10-19 DIAGNOSIS — I152 Hypertension secondary to endocrine disorders: Secondary | ICD-10-CM | POA: Diagnosis not present

## 2020-10-19 DIAGNOSIS — E1159 Type 2 diabetes mellitus with other circulatory complications: Secondary | ICD-10-CM

## 2020-10-19 DIAGNOSIS — E785 Hyperlipidemia, unspecified: Secondary | ICD-10-CM

## 2020-10-19 DIAGNOSIS — E1169 Type 2 diabetes mellitus with other specified complication: Secondary | ICD-10-CM | POA: Diagnosis not present

## 2020-10-19 DIAGNOSIS — Z66 Do not resuscitate: Secondary | ICD-10-CM | POA: Insufficient documentation

## 2020-10-19 NOTE — Assessment & Plan Note (Signed)
Discussed with patient and updated her chart

## 2020-10-19 NOTE — Assessment & Plan Note (Signed)
Well-controlled Discussed risk/benefits of statins at her advanced age and patient agrees to stop statin today due to diminishing benefit and increasing risk of side effects Repeat FLP and CMP in 6 months

## 2020-10-19 NOTE — Progress Notes (Signed)
Established patient visit   [name]   DOB: Oct 07, 1931   87 y.o. Female  MRN: [id number] Visit Date: 10/19/2020  Today's healthcare provider: Lavon Paganini, MD   Chief Complaint  Patient presents with   Diabetes   Hypertension   Hyperlipidemia   Subjective    Diabetes Pertinent negatives for hypoglycemia include no dizziness or headaches. Associated symptoms include fatigue. Pertinent negatives for diabetes include no chest pain and no weakness.  Hypertension Pertinent negatives include no chest pain, headaches, neck pain, palpitations or shortness of breath.  Hyperlipidemia Pertinent negatives include no chest pain, myalgias or shortness of breath.    Diabetes Mellitus Type II, Follow-up  Lab Results  Component Value Date   HGBA1C 7.5 10/11/2020   HGBA1C 7.0 07/23/2020   HGBA1C 7.6 (H) 12/05/2019   Wt Readings from Last 3 Encounters:  10/19/20 167 lb 4.8 oz (75.9 kg)  07/19/20 157 lb 1.6 oz (71.3 kg)  05/03/20 161 lb (73 kg)   Last seen for diabetes 3 months ago.  Management since then includes continue current medications, except stop metformin as this may be contributing to her loose stools. Recheck A1c in 3 months and consider replacing metformin with a different medication class or metformin XR.   She continues to take 5 mg glipizide and tolerating this dosage.   She reports excellent compliance with treatment. She is not having side effects.   Symptoms: Yes fatigue No foot ulcerations  No appetite changes No nausea  No paresthesia of the feet  No polydipsia  No polyuria No visual disturbances   No vomiting     Home blood sugar records:  not being checked  Episodes of hypoglycemia? No    Current insulin regiment: none Most Recent Eye Exam: UTD Current exercise: walking Current diet habits: in general, a "healthy" diet    Pertinent Labs: Lab Results  Component Value Date   CHOL 139 10/11/2020   HDL 47 10/11/2020    LDLCALC 74 10/11/2020   TRIG 99 10/11/2020   CHOLHDL 2.9 12/21/2018   Lab Results  Component Value Date   NA 140 10/11/2020   K 4.1 10/11/2020   CREATININE 0.9 10/11/2020   GFRNONAA 63 10/11/2020   GFRAA 80 04/12/2020   GLUCOSE 206 (H) 12/27/2019     --------------------------------------------------------------------------------------------------- Hypertension, follow-up  BP Readings from Last 3 Encounters:  10/19/20 (!) 150/57  07/19/20 (!) 90/49  05/03/20 136/64   Wt Readings from Last 3 Encounters:  10/19/20 167 lb 4.8 oz (75.9 kg)  07/19/20 157 lb 1.6 oz (71.3 kg)  05/03/20 161 lb (73 kg)     She was last seen for hypertension 3 months ago.  BP at that visit was 90/49. Management since that visit includes no changes.  She reports excellent compliance with treatment. She is not having side effects.  She is following a Regular diet. She is exercising. She does not smoke.  Use of agents associated with hypertension: none.   Outside blood pressures are stable. Symptoms: No chest pain No chest pressure  No palpitations No syncope  No dyspnea No orthopnea  No paroxysmal nocturnal dyspnea Yes lower extremity edema   Pertinent labs: Lab Results  Component Value Date   CHOL 139 10/11/2020   HDL 47 10/11/2020   LDLCALC 74 10/11/2020   TRIG 99 10/11/2020   CHOLHDL 2.9 12/21/2018   Lab Results  Component Value Date   NA 140 10/11/2020   K 4.1 10/11/2020  CREATININE 0.9 10/11/2020   GFRNONAA 63 10/11/2020   GFRAA 80 04/12/2020   GLUCOSE 206 (H) 12/27/2019     The ASCVD Risk score Mikey Bussing DC Jr., et al., 2013) failed to calculate for the following reasons:   The 2013 ASCVD risk score is only valid for ages 43 to 37   --------------------------------------------------------------------------------------------------- Lipid/Cholesterol, Follow-up  Last lipid panel Other pertinent labs  Lab Results  Component Value Date   CHOL 139 10/11/2020   HDL 47  10/11/2020   LDLCALC 74 10/11/2020   TRIG 99 10/11/2020   CHOLHDL 2.9 12/21/2018   Lab Results  Component Value Date   ALT 14 10/11/2020   AST 11 (A) 10/11/2020   PLT 206 10/11/2020   TSH 3.140 12/21/2018     She was last seen for this 3 months ago.  Management since that visit includes no changes. She is interested in stopping simvastatin. She will check back in for updates.   She reports excellent compliance with treatment. She is not having side effects.   Symptoms: No chest pain No chest pressure/discomfort  No dyspnea Yes lower extremity edema  No numbness or tingling of extremity No orthopnea  No palpitations No paroxysmal nocturnal dyspnea  No speech difficulty No syncope   Current diet: in general, a "healthy" diet   Current exercise: walking and streching  The ASCVD Risk score Mikey Bussing DC Jr., et al., 2013) failed to calculate for the following reasons:   The 2013 ASCVD risk score is only valid for ages 74 to 67  ---------------------------------------------------------------------------------------------------  Medications: Outpatient Medications Prior to Visit  Medication Sig   acetaminophen (TYLENOL) 500 MG tablet Take 2 tablets (1,000 mg total) by mouth 2 (two) times daily as needed (pain.).   Cholecalciferol (VITAMIN D) 2000 UNITS CAPS Take 2,000 Units by mouth daily.    Clobetasol Propionate 0.05 % shampoo 2- 3 times per week lather on scalp, leave on 20 minutes, wash out.   ELIQUIS 5 MG TABS tablet TAKE 1 TABLET BY MOUTH TWICE DAILY   FLUOCINOLONE ACETONIDE SCALP EX Apply 0.1 % topically daily as needed.   furosemide (LASIX) 20 MG tablet TAKE 1 TABLET BY MOUTH DAILY ONLY IF NEEDED.   glipiZIDE (GLUCOTROL XL) 5 MG 24 hr tablet TAKE ONE TABLET EACH MORNING WITH BREAKFAST   lisinopril (ZESTRIL) 20 MG tablet TAKE 1 TABLET BY MOUTH DAILY   metoprolol succinate (TOPROL-XL) 25 MG 24 hr tablet TAKE 1 TABLET BY MOUTH ONCE DAILY   Multiple Vitamin (MULTIVITAMIN WITH  MINERALS) TABS tablet Take 1 tablet by mouth daily.   Multiple Vitamins-Minerals (HAIR SKIN AND NAILS FORMULA PO) Take 2 tablets by mouth daily at 6 (six) AM.   mupirocin ointment (BACTROBAN) 2 % Apply topically 2 (two) times daily.   [DISCONTINUED] simvastatin (ZOCOR) 20 MG tablet TAKE 1 TABLET BY MOUTH AT BEDTIME   No facility-administered medications prior to visit.    Review of Systems  Constitutional:  Positive for fatigue. Negative for activity change, appetite change, chills, diaphoresis and fever.  HENT:  Negative for ear pain, sinus pressure, sinus pain and sore throat.   Eyes:  Negative for pain and visual disturbance.  Respiratory:  Negative for cough, chest tightness, shortness of breath and wheezing.   Cardiovascular:  Positive for leg swelling. Negative for chest pain and palpitations.  Gastrointestinal:  Negative for abdominal pain, blood in stool, diarrhea, nausea and vomiting.  Genitourinary:  Negative for flank pain, frequency, pelvic pain and urgency.  Musculoskeletal:  Negative for  back pain, myalgias and neck pain.  Neurological:  Negative for dizziness, weakness, light-headedness, numbness and headaches.       Objective    BP (!) 150/57 (BP Location: Left Arm, Patient Position: Sitting, Cuff Size: Large)   Pulse (!) 58   Temp 97.7 F (36.5 C) (Oral)   Resp 16   Ht 5' (1.524 m)   Wt 167 lb 4.8 oz (75.9 kg)   BMI 32.67 kg/m      Physical Exam Vitals reviewed.  Constitutional:      General: She is not in acute distress.    Appearance: Normal appearance. She is well-developed. She is not diaphoretic.  HENT:     Head: Normocephalic and atraumatic.  Eyes:     General: No scleral icterus.    Conjunctiva/sclera: Conjunctivae normal.  Neck:     Thyroid: No thyromegaly.  Cardiovascular:     Rate and Rhythm: Normal rate and regular rhythm.     Pulses: Normal pulses.     Heart sounds: Normal heart sounds. No murmur heard. Pulmonary:     Effort: Pulmonary  effort is normal. No respiratory distress.     Breath sounds: Normal breath sounds. No wheezing, rhonchi or rales.  Musculoskeletal:     Cervical back: Neck supple.     Right lower leg: No edema.     Left lower leg: No edema.  Lymphadenopathy:     Cervical: No cervical adenopathy.  Skin:    General: Skin is warm and dry.     Findings: No rash.  Neurological:     Mental Status: She is alert and oriented to person, place, and time. Mental status is at baseline.  Psychiatric:        Mood and Affect: Mood normal.        Behavior: Behavior normal.     Results for orders placed or performed in visit on 10/19/20  CBC and differential  Result Value Ref Range   Hemoglobin 11.2 (A) 12.0 - 16.0   HCT 34 (A) 36 - 46   Neutrophils Absolute 1,701.00    Platelets 206 150 - 399   WBC 6.3   CBC  Result Value Ref Range   RBC 3.75 (A) 3.87 - [id number]  Basic metabolic panel  Result Value Ref Range   Glucose 146    BUN 27 (A) 4 - 21   CO2 28 (A) 13 - 22   Creatinine 0.9 0.5 - 1.1   Potassium 4.1 3.4 - 5.3   Sodium 140 137 - 147   Chloride 107 99 - 108  Comprehensive metabolic panel  Result Value Ref Range   GFR calc non Af Amer 63    Calcium 8.7 8.7 - 10.7   Albumin 3.4 (A) 3.5 - 5.0  Lipid panel  Result Value Ref Range   Triglycerides 99 40 - 160   Cholesterol 139 0 - 200   HDL 47 35 - 70   LDL Cholesterol 74   Hepatic function panel  Result Value Ref Range   Alkaline Phosphatase 43 25 - 125   ALT 14 7 - 35   AST 11 (A) 13 - 35   Bilirubin, Total 0.4   Hemoglobin A1c  Result Value Ref Range   Hemoglobin A1C 7.5     Assessment & Plan     Problem List Items Addressed This Visit       Cardiovascular and Mediastinum   Hypertension associated with diabetes ([place])    Slightly elevated today,  but higher goal given advanced age Continue current medications Reviewed recent metabolic panel        Endocrine   Diabetes mellitus, type 2 ([place]) - Primary    Well-controlled with A1c  of 7.5 last week Higher goal A1c of less than 8 given advanced age and comorbidities Continue current medications, but consider discontinuing glipizide at next visit if A1c remains controlled      Hyperlipidemia associated with type 2 diabetes mellitus ([place])    Well-controlled Discussed risk/benefits of statins at her advanced age and patient agrees to stop statin today due to diminishing benefit and increasing risk of side effects Repeat FLP and CMP in 6 months        Other   DNR (do not resuscitate)    Discussed with patient and updated her chart      Relevant Orders   DNR (Do Not Resuscitate)    Return in about 6 months (around 04/21/2021) for AWV, CPE.      I,Essence Turner,acting as a scribe for Lavon Paganini, MD.,have documented all relevant documentation on the behalf of Lavon Paganini, MD,as directed by  Lavon Paganini, MD while in the presence of Lavon Paganini, MD.  I, Lavon Paganini, MD, have reviewed all documentation for this visit. The documentation on 10/19/20 for the exam, diagnosis, procedures, and orders are all accurate and complete.   Laray Rivkin, Dionne Bucy, MD, MPH [facility]

## 2020-10-19 NOTE — Progress Notes (Signed)
Opened in error

## 2020-10-19 NOTE — Assessment & Plan Note (Signed)
Slightly elevated today, but higher goal given advanced age Continue current medications Reviewed recent metabolic panel

## 2020-10-19 NOTE — Patient Instructions (Signed)
Simvastatin - stop taking and repeat labs in 6 months

## 2020-10-19 NOTE — Assessment & Plan Note (Signed)
Well-controlled with A1c of 7.5 last week Higher goal A1c of less than 8 given advanced age and comorbidities Continue current medications, but consider discontinuing glipizide at next visit if A1c remains controlled

## 2020-11-14 ENCOUNTER — Ambulatory Visit: Payer: Medicare Other | Admitting: Internal Medicine

## 2020-11-14 ENCOUNTER — Telehealth: Payer: Self-pay

## 2020-11-14 ENCOUNTER — Other Ambulatory Visit: Payer: Self-pay

## 2020-11-14 ENCOUNTER — Encounter: Payer: Self-pay | Admitting: Internal Medicine

## 2020-11-14 VITALS — BP 136/64 | HR 66 | Ht 60.0 in | Wt 169.0 lb

## 2020-11-14 DIAGNOSIS — R55 Syncope and collapse: Secondary | ICD-10-CM | POA: Diagnosis not present

## 2020-11-14 DIAGNOSIS — R0609 Other forms of dyspnea: Secondary | ICD-10-CM

## 2020-11-14 DIAGNOSIS — R2681 Unsteadiness on feet: Secondary | ICD-10-CM

## 2020-11-14 DIAGNOSIS — R0602 Shortness of breath: Secondary | ICD-10-CM

## 2020-11-14 DIAGNOSIS — R0600 Dyspnea, unspecified: Secondary | ICD-10-CM | POA: Diagnosis not present

## 2020-11-14 DIAGNOSIS — I480 Paroxysmal atrial fibrillation: Secondary | ICD-10-CM

## 2020-11-14 DIAGNOSIS — I152 Hypertension secondary to endocrine disorders: Secondary | ICD-10-CM

## 2020-11-14 DIAGNOSIS — E1159 Type 2 diabetes mellitus with other circulatory complications: Secondary | ICD-10-CM

## 2020-11-14 DIAGNOSIS — R531 Weakness: Secondary | ICD-10-CM

## 2020-11-14 NOTE — Telephone Encounter (Signed)
Prescription refill request for Eliquis received. Indication: afib  Last office visit: 11/14/2020 Scr: 0.9, 10/11/2020 Age: 87 yo  Weight: 76.7 kg   Pt is on the correct dose of Eliquis '5mg'$  BID.

## 2020-11-14 NOTE — Progress Notes (Signed)
Follow-up Outpatient Visit Date: 11/14/2020  Primary Care Provider: Virginia Crews, [address] [address]  Chief Complaint: Follow-up atrial fibrillation and syncope  HPI:  Ms. Kien is a 87 y.o. female with history of paroxysmal atrial fibrillation, hypertension, hyperlipidemia, type 2 diabetes mellitus, and recurrent syncope thought to be vasovagal in nature, who presents for follow-up of atrial fibrillation and syncope.  I last saw her in February, at which time Ms. Cory was doing fairly well.  She noted some intermittent swelling of the right foot near a remote injury site, which typically resolves when using furosemide regularly.  She had not suffered any further falls or syncopal episodes since our prior visit in November.  We agreed to defer medication changes and additional testing.  Today, Ms. Kanis is most concerned about progressive decline in her mobility, which she feels is mostly due to balance problems.  She does not have any focal weakness or pain in her legs.  Her daughter also feels like Ms. Kosco is more short of breath with activity.  Ms. Raikes feels like her exertional dyspnea might be slightly worse compared with prior visits when she is "walking faster."  She has stable mild lower extremity edema for which she is using as needed furosemide about 5 times a week.  She has not had any chest pain or palpitations.  She denies lightheadedness and syncope/falls.  --------------------------------------------------------------------------------------------------  Past Medical History:  Diagnosis Date   Basal cell carcinoma 03/30/2008   left upper arm/excision   Basal cell carcinoma 03/31/2008   left upper arm, right lower leg   Basal cell carcinoma 01/18/2009   left upper arm   Chronic diarrhea    Collagenous colitis    Diabetes mellitus without complication ([place])    Diverticulitis    Heart murmur    at birth, none since   History  of echocardiogram    a. 11/2015: echo showing EF of 60-65% with no WMA. Grade 1 DD and mild MR noted.    Hypertension    Intertrochanteric fracture of right femur ([place]) 06/11/2019   PAF (paroxysmal atrial fibrillation) ([place])    a. initially diagnosed in 08/2016 --> started on Eliquis   Squamous cell carcinoma of skin 05/16/2020   Left nasal tip anterior to ala, refer to [facility]   Syncope    a. initially occurring in Fall 2016 b. Hospitalized in 10/2015 and 11/2015 for recurrent episodes.    Past Surgical History:  Procedure Laterality Date   ABDOMINAL HYSTERECTOMY     BACK SURGERY  08/08/2008   Lumbar discectomy   COLONOSCOPY WITH PROPOFOL     EXCISION/RELEASE BURSA HIP Left 05/03/2019   Procedure: HIP ABDUCTOR REPAIR;  Surgeon: Hessie Knows, MD;  Location: [facility] ORS;  Service: Orthopedics;  Laterality: Left;   INTRAMEDULLARY (IM) NAIL INTERTROCHANTERIC Right 06/13/2019   Procedure: INTRAMEDULLARY (IM) NAIL INTERTROCHANTRIC;  Surgeon: Hessie Knows, MD;  Location: [facility] ORS;  Service: Orthopedics;  Laterality: Right;   NECK SURGERY  1980    Current Meds  Medication Sig   acetaminophen (TYLENOL) 500 MG tablet Take 2 tablets (1,000 mg total) by mouth 2 (two) times daily as needed (pain.).   Cholecalciferol (VITAMIN D) 2000 UNITS CAPS Take 2,000 Units by mouth daily.    Clobetasol Propionate 0.05 % shampoo 2- 3 times per week lather on scalp, leave on 20 minutes, wash out.   ELIQUIS 5 MG TABS tablet TAKE 1 TABLET BY MOUTH TWICE DAILY   FLUOCINOLONE  ACETONIDE SCALP EX Apply 0.1 % topically daily as needed.   furosemide (LASIX) 20 MG tablet TAKE 1 TABLET BY MOUTH DAILY ONLY IF NEEDED.   glipiZIDE (GLUCOTROL XL) 5 MG 24 hr tablet TAKE ONE TABLET EACH MORNING WITH BREAKFAST   lisinopril (ZESTRIL) 20 MG tablet TAKE 1 TABLET BY MOUTH DAILY   metoprolol succinate (TOPROL-XL) 25 MG 24 hr tablet TAKE 1 TABLET BY MOUTH ONCE DAILY   Multiple Vitamin (MULTIVITAMIN WITH MINERALS) TABS tablet Take 1 tablet  by mouth daily.   Multiple Vitamins-Minerals (HAIR SKIN AND NAILS FORMULA PO) Take 2 tablets by mouth daily at 6 (six) AM.   mupirocin ointment (BACTROBAN) 2 % Apply topically 2 (two) times daily.    Allergies: Morphine sulfate and Penicillins  Social History   Tobacco Use   Smoking status: Former    Types: Cigarettes    Quit date: 03/09/1978    Years since quitting: 42.7   Smokeless tobacco: Never  Vaping Use   Vaping Use: Never used  Substance Use Topics   Alcohol use: Yes    Alcohol/week: 0.0 - 1.0 standard drinks    Comment: once a month   Drug use: No    Family History  Problem Relation Age of Onset   Hypertension Mother    Diabetes Mother    Lung cancer Father     Review of Systems: A 12-system review of systems was performed and was negative except as noted in the HPI.  --------------------------------------------------------------------------------------------------  Physical Exam: BP 136/64 (BP Location: Left Arm, Patient Position: Sitting, Cuff Size: Large)   Pulse 66   Ht 5' (1.524 m)   Wt 169 lb (76.7 kg)   SpO2 96%   BMI 33.01 kg/m   General:  NAD. Neck: No JVD or HJR. Lungs: Clear to auscultation bilaterally without wheezes or crackles. Heart: Regular rate and rhythm with 2/6 systolic murmur and occasional extrasystoles. Abdomen: Soft, nontender, nondistended. Extremities: Trace pretibial edema.  EKG: Normal sinus rhythm with PACs and PVCs.  PACs and PVCs are new since 05/03/2020.  Lab Results  Component Value Date   WBC 6.3 10/11/2020   HGB 11.2 (A) 10/11/2020   HCT 34 (A) 10/11/2020   MCV 90.4 12/27/2019   PLT 206 10/11/2020    Lab Results  Component Value Date   NA 140 10/11/2020   K 4.1 10/11/2020   CL 107 10/11/2020   CO2 28 (A) 10/11/2020   BUN 27 (A) 10/11/2020   CREATININE 0.9 10/11/2020   GLUCOSE 206 (H) 12/27/2019   ALT 14 10/11/2020    Lab Results  Component Value Date   CHOL 139 10/11/2020   HDL 47 10/11/2020    LDLCALC 74 10/11/2020   TRIG 99 10/11/2020   CHOLHDL 2.9 12/21/2018    --------------------------------------------------------------------------------------------------  ASSESSMENT AND PLAN: Paroxysmal atrial fibrillation: No evidence of recurrence.  We will plan to continue current regimen of metoprolol and apixaban.  Vasovagal syncope: No recurrent episodes.  I encouraged Ms. Wernick to stay well-hydrated and to continue using her walker for support.  Dyspnea on exertion: This seems to have progressed slightly from prior visits.  Ms. Winterfeld has trace pretibial edema on exam, which is chronic.  We have agreed to obtain an echocardiogram at her convenience.  Continue as needed furosemide.  Gait instability and generalized weakness: Ms. Balzarini and her daughter are concerned about decreasing mobility secondary to worsening balance and generalized strength.  They have requested a referral to physical therapy, which I think would be  very reasonable.  We will place this referral, hopefully to be performed at [facility] where Ms. Heringer resides.  Hypertension associated with type 2 diabetes mellitus: Blood pressure mildly elevated today but typically better at home.  We will defer escalation of current regimen of metoprolol and lisinopril.  Follow-up: Return to clinic in 6 months, sooner if symptoms worsen or echo shows significant abnormality.  Nelva Bush, MD 11/14/2020 11:20 AM

## 2020-11-14 NOTE — Telephone Encounter (Signed)
Medication Samples have been provided to the patient.  Drug name: Eliquis       Strength: '5mg'$         Qty: 28 (2 boxes) LOT: [id number]  Exp.Date: 5/24  Rene Paci McClain 2:03 PM 11/14/2020

## 2020-11-14 NOTE — Patient Instructions (Signed)
Medication Instructions:   Your physician recommends that you continue on your current medications as directed. Please refer to the Current Medication list given to you today.  *If you need a refill on your cardiac medications before your next appointment, please call your pharmacy*   Lab Work:  None ordered  Testing/Procedures:  Your physician has requested that you have an echocardiogram. Echocardiography is a painless test that uses sound waves to create images of your heart. It provides your doctor with information about the size and shape of your heart and how well your heart's chambers and valves are working. This procedure takes approximately one hour. There are no restrictions for this procedure.   Follow-Up: At [facility], you and your health needs are our priority.  As part of our continuing mission to provide you with exceptional heart care, we have created designated Provider Care Teams.  These Care Teams include your primary Cardiologist (physician) and Advanced Practice Providers (APPs -  Physician Assistants and Nurse Practitioners) who all work together to provide you with the care you need, when you need it.  We recommend signing up for the patient portal called "MyChart".  Sign up information is provided on this After Visit Summary.  MyChart is used to connect with patients for Virtual Visits (Telemedicine).  Patients are able to view lab/test results, encounter notes, upcoming appointments, etc.  Non-urgent messages can be sent to your provider as well.   To learn more about what you can do with MyChart, go to [email or web address].    Your next appointment:   6 month(s)  The format for your next appointment:   In Person  Provider:   You may see Nelva Bush, MD or one of the following Advanced Practice Providers on your designated Care Team:   Murray Hodgkins, NP Christell Faith, PA-C Marrianne Mood, PA-C Cadence Kathlen Mody, [place]   Other Instructions  You  have been referred to physical therapy to be done at [facility].  You will be contacted regarding this referral.

## 2020-11-14 NOTE — Telephone Encounter (Signed)
Patient requesting samples of Eliquis '5mg'$  because she is in the donut hole. Please advise if OK to provide. Thank you!

## 2020-11-16 ENCOUNTER — Encounter: Payer: Self-pay | Admitting: Internal Medicine

## 2020-11-16 DIAGNOSIS — R0609 Other forms of dyspnea: Secondary | ICD-10-CM | POA: Insufficient documentation

## 2020-11-16 DIAGNOSIS — R0600 Dyspnea, unspecified: Secondary | ICD-10-CM | POA: Insufficient documentation

## 2020-11-16 DIAGNOSIS — R0602 Shortness of breath: Secondary | ICD-10-CM | POA: Insufficient documentation

## 2020-11-16 DIAGNOSIS — R2681 Unsteadiness on feet: Secondary | ICD-10-CM | POA: Insufficient documentation

## 2020-11-18 ENCOUNTER — Other Ambulatory Visit: Payer: Self-pay | Admitting: Family Medicine

## 2020-11-18 DIAGNOSIS — E119 Type 2 diabetes mellitus without complications: Secondary | ICD-10-CM

## 2020-11-18 NOTE — Telephone Encounter (Signed)
Requested Prescriptions  Pending Prescriptions Disp Refills  . glipiZIDE (GLUCOTROL XL) 5 MG 24 hr tablet [Pharmacy Med Name: GLIPIZIDE ER 5 MG TAB] 90 tablet 1    Sig: TAKE ONE TABLET EACH MORNING WITH BREAKFAST     Endocrinology:  Diabetes - Sulfonylureas Passed - 11/18/2020 11:06 AM      Passed - HBA1C is between 0 and 7.9 and within 180 days    Hemoglobin A1C  Date Value Ref Range Status  10/11/2020 7.5  Final         Passed - Valid encounter within last 6 months    Recent Outpatient Visits          1 month ago Type 2 diabetes mellitus with other specified complication, without long-term current use of insulin [facility] [place] [facility])   [facility] [place], Dionne Bucy, MD   4 months ago Type 2 diabetes mellitus with other specified complication, without long-term current use of insulin [facility])   [facility], Dionne Bucy, MD   9 months ago Aspiration pneumonia of both upper lobes, unspecified aspiration pneumonia type [facility])   [place], Vickki Muff, PA-C   11 months ago Urinary frequency   [employer] [facility], Vickki Muff, PA-C   1 year ago Type 2 diabetes mellitus with hyperosmolarity without coma, without long-term current use of insulin [facility])   [employer], Vickki Muff, PA-C      Future Appointments            In 5 months End, Harrell Gave, MD [facility], [facility]

## 2020-11-26 ENCOUNTER — Other Ambulatory Visit: Payer: Self-pay | Admitting: Family Medicine

## 2020-11-27 ENCOUNTER — Other Ambulatory Visit: Payer: Self-pay | Admitting: Dermatology

## 2020-11-27 DIAGNOSIS — L409 Psoriasis, unspecified: Secondary | ICD-10-CM

## 2020-12-18 ENCOUNTER — Ambulatory Visit (INDEPENDENT_AMBULATORY_CARE_PROVIDER_SITE_OTHER): Payer: Medicare Other

## 2020-12-18 ENCOUNTER — Other Ambulatory Visit: Payer: Self-pay

## 2020-12-18 DIAGNOSIS — R0609 Other forms of dyspnea: Secondary | ICD-10-CM | POA: Diagnosis not present

## 2020-12-18 MED ORDER — PERFLUTREN LIPID MICROSPHERE
1.0000 mL | INTRAVENOUS | Status: AC | PRN
  Administered 2020-12-18: 4 mL via INTRAVENOUS

## 2020-12-19 LAB — ECHOCARDIOGRAM COMPLETE
AR max vel: 2.24 cm2
AV Area VTI: 2.47 cm2
AV Area mean vel: 2 cm2
AV Mean grad: 3 mmHg
AV Peak grad: 5.4 mmHg
Ao pk vel: 1.16 m/s
Area-P 1/2: 2.61 cm2
Calc EF: 60.5 %
Single Plane A2C EF: 52.4 %
Single Plane A4C EF: 64.3 %

## 2020-12-31 ENCOUNTER — Other Ambulatory Visit: Payer: Self-pay | Admitting: Internal Medicine

## 2020-12-31 NOTE — Telephone Encounter (Signed)
Refill Request.  

## 2021-01-01 NOTE — Telephone Encounter (Signed)
Prescription refill request for Eliquis received. Indication:afib Last office visit:end 11/14/20 Scr:0.9 10/11/20 Age: 25f Weight:76.7kg

## 2021-01-14 ENCOUNTER — Other Ambulatory Visit: Payer: Self-pay | Admitting: Internal Medicine

## 2021-02-04 ENCOUNTER — Telehealth: Payer: Self-pay | Admitting: Family Medicine

## 2021-02-04 NOTE — Telephone Encounter (Signed)
She and I discussed stopping statin at last visit. Let patient know we have not refilled it. Thanks!

## 2021-02-04 NOTE — Telephone Encounter (Signed)
Please Review. Per last office notes Provider discussed risk/benefits of statins at her advanced age and patient agreed to stop statin due to diminishing benefit and increasing risk of side effect.

## 2021-02-04 NOTE — Telephone Encounter (Signed)
[facility] faxed refill request for the following medications:  simvastatin (ZOCOR) 20 MG tablet   Please advise.

## 2021-02-06 ENCOUNTER — Other Ambulatory Visit: Payer: Self-pay | Admitting: Internal Medicine

## 2021-02-06 NOTE — Telephone Encounter (Signed)
Patient advised as below.  

## 2021-03-20 ENCOUNTER — Telehealth: Payer: Self-pay

## 2021-03-20 DIAGNOSIS — R609 Edema, unspecified: Secondary | ICD-10-CM

## 2021-03-20 MED ORDER — FUROSEMIDE 20 MG PO TABS: ORAL_TABLET | ORAL | 1 refills | Status: DC

## 2021-03-20 NOTE — Telephone Encounter (Signed)
[facility] faxed refill request for the following medications:  furosemide (LASIX) 20 MG tablet    Please advise.

## 2021-04-16 ENCOUNTER — Other Ambulatory Visit: Payer: Self-pay | Admitting: Internal Medicine

## 2021-04-29 ENCOUNTER — Other Ambulatory Visit: Payer: Self-pay

## 2021-04-29 ENCOUNTER — Encounter: Payer: Self-pay | Admitting: Family Medicine

## 2021-04-29 ENCOUNTER — Ambulatory Visit (INDEPENDENT_AMBULATORY_CARE_PROVIDER_SITE_OTHER): Payer: Medicare Other | Admitting: Family Medicine

## 2021-04-29 VITALS — BP 132/60 | HR 68 | Temp 97.0°F | Resp 16 | Ht 60.0 in | Wt 179.0 lb

## 2021-04-29 DIAGNOSIS — E1169 Type 2 diabetes mellitus with other specified complication: Secondary | ICD-10-CM

## 2021-04-29 DIAGNOSIS — I10 Essential (primary) hypertension: Secondary | ICD-10-CM

## 2021-04-29 DIAGNOSIS — E1100 Type 2 diabetes mellitus with hyperosmolarity without nonketotic hyperglycemic-hyperosmolar coma (NKHHC): Secondary | ICD-10-CM | POA: Diagnosis not present

## 2021-04-29 DIAGNOSIS — Z0000 Encounter for general adult medical examination without abnormal findings: Secondary | ICD-10-CM

## 2021-04-29 DIAGNOSIS — Z6834 Body mass index (BMI) 34.0-34.9, adult: Secondary | ICD-10-CM | POA: Diagnosis not present

## 2021-04-29 DIAGNOSIS — E785 Hyperlipidemia, unspecified: Secondary | ICD-10-CM

## 2021-04-29 DIAGNOSIS — I152 Hypertension secondary to endocrine disorders: Secondary | ICD-10-CM

## 2021-04-29 DIAGNOSIS — E669 Obesity, unspecified: Secondary | ICD-10-CM

## 2021-04-29 DIAGNOSIS — E1159 Type 2 diabetes mellitus with other circulatory complications: Secondary | ICD-10-CM | POA: Diagnosis not present

## 2021-04-29 DIAGNOSIS — D692 Other nonthrombocytopenic purpura: Secondary | ICD-10-CM

## 2021-04-29 DIAGNOSIS — I480 Paroxysmal atrial fibrillation: Secondary | ICD-10-CM

## 2021-04-29 NOTE — Assessment & Plan Note (Signed)
Well controlled Continue current medications Recheck metabolic panel F/u in 6 months  

## 2021-04-29 NOTE — Assessment & Plan Note (Signed)
Discussed importance of healthy weight management Discussed diet and exercise  

## 2021-04-29 NOTE — Assessment & Plan Note (Addendum)
F/b cardiology Continue eliquis and metop for rate control Recheck CBC q25m

## 2021-04-29 NOTE — Assessment & Plan Note (Signed)
Well controlled with last A1c - recheck today Continue current medications UTD on vaccines, eye exam, foot exam (completed today) On ACEi/ARB On Statin Discussed diet and exercise F/u in 6 months

## 2021-04-29 NOTE — Assessment & Plan Note (Signed)
Chronic and stable.   

## 2021-04-29 NOTE — Assessment & Plan Note (Signed)
Previously well controlled Continue statin Repeat FLP and CMP  

## 2021-04-29 NOTE — Progress Notes (Signed)
Annual Wellness Visit     [name], Female    DOB: Nov 08, 1931, 88 y.o.   MRN: [id number] Visit Date: 04/29/2021  Today's Provider: Lavon Paganini, MD    I,Joseline E Rosas,acting as a scribe for Lavon Paganini, MD.,have documented all relevant documentation on the behalf of Lavon Paganini, MD,as directed by  Lavon Paganini, MD while in the presence of Lavon Paganini, MD.   Chief Complaint  Patient presents with   Annual Exam   Subjective    [name] is a 88 y.o. female who presents today for her Annual Wellness Visit. She reports consuming a general diet. Home exercise routine includes stretching and some walking. She generally feels well. She reports sleeping fairly well. She does not have additional problems to discuss today.   HPI    Medications: Outpatient Medications Prior to Visit  Medication Sig   acetaminophen (TYLENOL) 500 MG tablet Take 2 tablets (1,000 mg total) by mouth 2 (two) times daily as needed (pain.).   Cholecalciferol (VITAMIN D) 2000 UNITS CAPS Take 2,000 Units by mouth daily.    Clobetasol Propionate 0.05 % shampoo 2-3 TIMES PER WEEK LATHER ON SCALP, LEAVE ON 20 MINUTES, WASH OUT.   ELIQUIS 5 MG TABS tablet TAKE 1 TABLET BY MOUTH TWICE DAILY   FLUOCINOLONE ACETONIDE SCALP EX Apply 0.1 % topically daily as needed.   furosemide (LASIX) 20 MG tablet TAKE 1 TABLET BY MOUTH DAILY ONLY IF NEEDED.   glipiZIDE (GLUCOTROL XL) 5 MG 24 hr tablet TAKE ONE TABLET EACH MORNING WITH BREAKFAST   lisinopril (ZESTRIL) 20 MG tablet TAKE 1 TABLET BY MOUTH DAILY   metoprolol succinate (TOPROL-XL) 25 MG 24 hr tablet TAKE 1 TABLET BY MOUTH ONCE DAILY   Multiple Vitamin (MULTIVITAMIN WITH MINERALS) TABS tablet Take 1 tablet by mouth daily.   Multiple Vitamins-Minerals (HAIR SKIN AND NAILS FORMULA PO) Take 2 tablets by mouth daily at 6 (six) AM.   mupirocin ointment (BACTROBAN) 2 % Apply topically 2 (two) times daily.   simvastatin  (ZOCOR) 20 MG tablet TAKE 1 TABLET BY MOUTH AT BEDTIME   No facility-administered medications prior to visit.    Allergies  Allergen Reactions   Morphine Sulfate Other (See Comments)    BP bottoms out   Penicillins Diarrhea    Has patient had a PCN reaction causing immediate rash, facial/tongue/throat swelling, SOB or lightheadedness with hypotension: Yes Has patient had a PCN reaction causing severe rash involving mucus membranes or skin necrosis: Yes Has patient had a PCN reaction that required hospitalization No Has patient had a PCN reaction occurring within the last 10 years: No If all of the above answers are "NO", then may proceed with Cephalosporin use.    Patient Care Team: Virginia Crews, MD as PCP - General (Family Medicine) End, Harrell Gave, MD as PCP - Cardiology (Cardiology) Leandrew Koyanagi, MD as Referring Physician (Ophthalmology)  Review of Systems  Eyes:  Positive for itching.  Respiratory:  Positive for shortness of breath.    Last CBC Lab Results  Component Value Date   WBC 6.3 10/11/2020   HGB 11.2 (A) 10/11/2020   HCT 34 (A) 10/11/2020   MCV 90.4 12/27/2019   MCH 29.7 12/27/2019   RDW 13.0 12/27/2019   PLT 206 32/44/0102   Last metabolic panel Lab Results  Component Value Date   GLUCOSE 206 (H) 12/27/2019   NA 140 10/11/2020   K 4.1 10/11/2020   CL 107 10/11/2020  CO2 28 (A) 10/11/2020   BUN 27 (A) 10/11/2020   CREATININE 0.9 10/11/2020   GFRNONAA 63 10/11/2020   CALCIUM 8.7 10/11/2020   PROT 6.6 12/23/2019   ALBUMIN 3.4 (A) 10/11/2020   LABGLOB 3.0 12/05/2019   AGRATIO 1.4 12/05/2019   BILITOT 1.4 (H) 12/23/2019   ALKPHOS 43 10/11/2020   AST 11 (A) 10/11/2020   ALT 14 10/11/2020   ANIONGAP 10 12/27/2019   Last lipids Lab Results  Component Value Date   CHOL 139 10/11/2020   HDL 47 10/11/2020   LDLCALC 74 10/11/2020   TRIG 99 10/11/2020   CHOLHDL 2.9 12/21/2018   Last hemoglobin A1c Lab Results  Component Value  Date   HGBA1C 7.5 10/11/2020   Last thyroid functions Lab Results  Component Value Date   TSH 3.140 12/21/2018   T4TOTAL 5.4 08/29/2016    No results found for: VITAMINB12, FOLATE      Objective    Vitals: BP 132/60 (BP Location: Right Arm, Cuff Size: Large)    Pulse 68    Temp (!) 97 F (36.1 C)    Resp 16    Ht 5' (1.524 m)    Wt 179 lb (81.2 kg)    BMI 34.96 kg/m  BP Readings from Last 3 Encounters:  04/29/21 132/60  11/14/20 136/64  10/19/20 (!) 150/57   Wt Readings from Last 3 Encounters:  04/29/21 179 lb (81.2 kg)  11/14/20 169 lb (76.7 kg)  10/19/20 167 lb 4.8 oz (75.9 kg)      Physical Exam Vitals reviewed.  Constitutional:      General: She is not in acute distress.    Appearance: Normal appearance. She is well-developed. She is not diaphoretic.  HENT:     Head: Normocephalic and atraumatic.  Eyes:     General: No scleral icterus.    Conjunctiva/sclera: Conjunctivae normal.  Neck:     Thyroid: No thyromegaly.  Cardiovascular:     Rate and Rhythm: Normal rate and regular rhythm.     Pulses: Normal pulses.     Heart sounds: Normal heart sounds. No murmur heard. Pulmonary:     Effort: Pulmonary effort is normal. No respiratory distress.     Breath sounds: Normal breath sounds. No wheezing, rhonchi or rales.  Musculoskeletal:     Cervical back: Neck supple.     Right lower leg: No edema.     Left lower leg: No edema.  Lymphadenopathy:     Cervical: No cervical adenopathy.  Skin:    General: Skin is warm and dry.     Findings: No rash.  Neurological:     Mental Status: She is alert and oriented to person, place, and time. Mental status is at baseline.  Psychiatric:        Mood and Affect: Mood normal.        Behavior: Behavior normal.    Diabetic Foot Exam - Simple   Simple Foot Form Diabetic Foot exam was performed with the following findings: Yes 04/29/2021 11:10 AM  Visual Inspection No deformities, no ulcerations, no other skin breakdown  bilaterally: Yes Sensation Testing See comments: Yes Pulse Check Posterior Tibialis and Dorsalis pulse intact bilaterally: Yes Comments Minimally intact sensation to monofilament      Most recent functional status assessment: In your present state of health, do you have any difficulty performing the following activities: 10/19/2020  Hearing? N  Vision? N  Difficulty concentrating or making decisions? N  Walking or climbing stairs? Darreld Mclean  Dressing or bathing? N  Doing errands, shopping? N  Some recent data might be hidden   Most recent fall risk assessment: Fall Risk  10/19/2020  Falls in the past year? 0  Number falls in past yr: 0  Injury with Fall? 0  Risk for fall due to : Impaired balance/gait  Follow up Falls evaluation completed    Most recent depression screenings: PHQ 2/9 Scores 10/19/2020 04/25/2020  PHQ - 2 Score 0 0  PHQ- 9 Score 2 -   Most recent cognitive screening: 6CIT Screen 04/25/2020  What Year? 0 points  What month? 0 points  What time? 0 points  Count back from 20 0 points  Months in reverse 0 points  Repeat phrase 4 points  Total Score 4   Most recent Audit-C alcohol use screening Alcohol Use Disorder Test (AUDIT) 10/19/2020  1. How often do you have a drink containing alcohol? 2  2. How many drinks containing alcohol do you have on a typical day when you are drinking? 0  3. How often do you have six or more drinks on one occasion? 0  AUDIT-C Score 2  Alcohol Brief Interventions/Follow-up -   A score of 3 or more in women, and 4 or more in men indicates increased risk for alcohol abuse, EXCEPT if all of the points are from question 1   No results found for any visits on 04/29/21.  Assessment & Plan     Annual wellness visit done today including the all of the following: Reviewed patient's Family Medical History Reviewed and updated list of patient's medical providers Assessment of cognitive impairment was done Assessed patient's functional  ability Established a written schedule for health screening [place] Completed and Reviewed  Exercise Activities and Dietary recommendations  Goals      DIET - INCREASE WATER INTAKE     Recommend to drink at least 6-8 8oz glasses of water per day.        Immunization History  Administered Date(s) Administered   Fluad Quad(high Dose 65+) 12/20/2018   Influenza Split 01/01/2009, 01/02/2010, 01/13/2011, 12/22/2011   Influenza, High Dose Seasonal PF 01/18/2014, 01/01/2015, 11/30/2015, 12/01/2016, 01/11/2018, 12/08/2019   Influenza-Unspecified 01/08/2013, 11/08/2020   PFIZER(Purple Top)SARS-COV-2 Vaccination 04/01/2019, 04/22/2019, 12/05/2019   Pneumococcal Conjugate-13 10/19/2013   Pneumococcal Polysaccharide-23 01/02/2010   Td 11/02/2003, 01/13/2011   Tdap 01/13/2011, 02/28/2015   Zoster, Live 07/14/2007    Health Maintenance  Topic Date Due   Zoster Vaccines- Shingrix (1 of 2) Never done   COVID-19 Vaccine (4 - Booster for Pfizer series) 01/30/2020   HEMOGLOBIN A1C  04/13/2021   OPHTHALMOLOGY EXAM  06/08/2021   FOOT EXAM  04/29/2022   TETANUS/TDAP  02/27/2025   Pneumonia Vaccine 66+ Years old  Completed   INFLUENZA VACCINE  Completed   DEXA SCAN  Completed   HPV VACCINES  Aged Out     Discussed health benefits of physical activity, and encouraged her to engage in regular exercise appropriate for her age and condition.    Problem List Items Addressed This Visit       Cardiovascular and Mediastinum   Hypertension associated with type 2 diabetes mellitus ([place])    Well controlled Continue current medications Recheck metabolic panel F/u in 6 months       Senile purpura (HCC)    Chronic and stable      Paroxysmal atrial fibrillation (HCC)    F/b cardiology Continue eliquis and metop for rate control Recheck CBC q79m  Relevant Orders   CBC     Endocrine   Diabetes mellitus, type 2 ([place])    Well controlled with last A1c - recheck  today Continue current medications UTD on vaccines, eye exam, foot exam (completed today) On ACEi/ARB On Statin Discussed diet and exercise F/u in 6 months       Relevant Orders   Hemoglobin A1c   Ambulatory referral to Podiatry   Hyperlipidemia associated with type 2 diabetes mellitus ([place])    Previously well controlled Continue statin Repeat FLP and CMP      Relevant Orders   Lipid Panel With LDL/HDL Ratio     Other   Obesity    Discussed importance of healthy weight management Discussed diet and exercise       Other Visit Diagnoses     Encounter for Medicare annual wellness exam    -  Primary   Encounter for annual physical exam       Essential hypertension       Relevant Orders   Comprehensive metabolic panel        Return in about 6 months (around 10/27/2021) for chronic disease f/u.     I, Lavon Paganini, MD, have reviewed all documentation for this visit. The documentation on 04/29/21 for the exam, diagnosis, procedures, and orders are all accurate and complete.   Lido Maske, Dionne Bucy, MD, MPH [facility]

## 2021-04-30 LAB — COMPREHENSIVE METABOLIC PANEL
ALT: 30 IU/L (ref 0–32)
AST: 19 IU/L (ref 0–40)
Albumin/Globulin Ratio: 1.8 (ref 1.2–2.2)
Albumin: 4.4 g/dL (ref 3.6–4.6)
Alkaline Phosphatase: 75 IU/L (ref 44–121)
BUN/Creatinine Ratio: 32 — ABNORMAL HIGH (ref 12–28)
BUN: 30 mg/dL — ABNORMAL HIGH (ref 8–27)
Bilirubin Total: 0.4 mg/dL (ref 0.0–1.2)
CO2: 24 mmol/L (ref 20–29)
Calcium: 9.7 mg/dL (ref 8.7–10.3)
Chloride: 101 mmol/L (ref 96–106)
Creatinine, Ser: 0.93 mg/dL (ref 0.57–1.00)
Globulin, Total: 2.5 g/dL (ref 1.5–4.5)
Glucose: 275 mg/dL — ABNORMAL HIGH (ref 70–99)
Potassium: 4.8 mmol/L (ref 3.5–5.2)
Sodium: 138 mmol/L (ref 134–144)
Total Protein: 6.9 g/dL (ref 6.0–8.5)
eGFR: 59 mL/min/{1.73_m2} — ABNORMAL LOW (ref >59)

## 2021-04-30 LAB — LIPID PANEL WITH LDL/HDL RATIO
Cholesterol, Total: 258 mg/dL — ABNORMAL HIGH (ref 100–199)
HDL: 38 mg/dL — ABNORMAL LOW (ref >39)
LDL Chol Calc (NIH): 146 mg/dL — ABNORMAL HIGH (ref 0–99)
LDL/HDL Ratio: 3.8 ratio — ABNORMAL HIGH (ref 0.0–3.2)
Triglycerides: 399 mg/dL — ABNORMAL HIGH (ref 0–149)
VLDL Cholesterol Cal: 74 mg/dL — ABNORMAL HIGH (ref 5–40)

## 2021-04-30 LAB — HEMOGLOBIN A1C
Est. average glucose Bld gHb Est-mCnc: 275 mg/dL
Hgb A1c MFr Bld: 11.2 % — ABNORMAL HIGH (ref 4.8–5.6)

## 2021-04-30 LAB — CBC
Hematocrit: 40.7 % (ref 34.0–46.6)
Hemoglobin: 13.6 g/dL (ref 11.1–15.9)
MCH: 30.6 pg (ref 26.6–33.0)
MCHC: 33.4 g/dL (ref 31.5–35.7)
MCV: 92 fL (ref 79–97)
Platelets: 252 10*3/uL (ref 150–450)
RBC: 4.44 x10E6/uL (ref 3.77–5.28)
RDW: 11.7 % (ref 11.7–15.4)
WBC: 6.9 10*3/uL (ref 3.4–10.8)

## 2021-05-02 ENCOUNTER — Ambulatory Visit: Payer: Self-pay

## 2021-05-02 ENCOUNTER — Telehealth: Payer: Self-pay

## 2021-05-02 DIAGNOSIS — E1100 Type 2 diabetes mellitus with hyperosmolarity without nonketotic hyperglycemic-hyperosmolar coma (NKHHC): Secondary | ICD-10-CM

## 2021-05-02 MED ORDER — EMPAGLIFLOZIN 10 MG PO TABS: 10.0000 mg | ORAL_TABLET | Freq: Every day | ORAL | 1 refills | Status: DC

## 2021-05-02 NOTE — Telephone Encounter (Signed)
[place] and left VM for [place] to [facility] and speak with a nurse regarding medication.   Summary: [place]-FU received med not aware of and not told to stop additional med   [facility] is calling as just received a packet of medication from pharmacy one being empagliflozin (JARDIANCE) 10 MG TABS tablet 90 tablet 1 05/02/2021  Sig - Route: Take 1 tablet (10 mg total) by mouth daily before breakfast. - Oral  Sent to pharmacy as: empagliflozin (JARDIANCE) 10 MG Tab tablet  E-Prescribing Status: Receipt confirmed by pharmacy (05/02/2021  1:38 PM EST)   Caller / nurse on call, states pt and staff confused as not been told to stop glipiZIDE (GLUCOTROL XL) 5 MG 24 hr tablet 90 tablet 1 11/18/2020  Sig: TAKE ONE TABLET EACH MORNING WITH BREAKFAST  Sent to pharmacy as: glipiZIDE (GLUCOTROL XL) 5 MG 24 hr tablet   Nurse on call leaving for day states use fu# [phone], [place] will be aware and fu with staff

## 2021-05-02 NOTE — Telephone Encounter (Signed)
-----   Message from Virginia Crews, MD sent at 04/30/2021  7:55 AM EST ----- Normal/stable labs, except for increase in A1c and cholesterol.  Patient elected to stop statin at last visit for primary prevention at age 88.  For diabetes, is she still taking glipizide?  Recommend starting Jardiance 10 mg daily and recheck in 3 months. Ok to send Rx if patient agrees.

## 2021-05-02 NOTE — Telephone Encounter (Signed)
Patient and her daughter Neoma Laming advise of results. Patient states she is taking Glipizide. Patient agrees to add Jardiance. Prescription sent into pharmacy.  Follow up appointment scheduled for 08/19/2021 at 2:20pm. Patient's daughter requested that I send a message to her about these changes in mychart so that patient can print out and have as a reference. Mychart message sent to patient.

## 2021-05-02 NOTE — Telephone Encounter (Signed)
Called and [facility] to discuss medication with a nurse. [place], CMA has already spoke with pt and daughter today regarding medications so [facility] just needs to be advised as well.  Randal Buba, CMA  1:38 PM Note Patient and her daughter Neoma Laming advise of results. Patient states she is taking Glipizide. Patient agrees to add Jardiance. Prescription sent into pharmacy.  Follow up appointment scheduled for 08/19/2021 at 2:20pm. Patient's daughter requested that I send a message to her about these changes in mychart so that patient can print out and have as a reference. Mychart message sent to patient.

## 2021-05-02 NOTE — Telephone Encounter (Signed)
Received call from [place] from [facility]. Verified Pt's name and DOB.  Reviewed Dr. Sharmaine Base orders with her and upcoming appt.

## 2021-05-07 ENCOUNTER — Other Ambulatory Visit: Payer: Self-pay | Admitting: Family Medicine

## 2021-05-07 NOTE — Telephone Encounter (Signed)
[facility] faxed refill request for the following medications:  simvastatin (ZOCOR) 20 MG table  Please advise.

## 2021-05-08 MED ORDER — SIMVASTATIN 20 MG PO TABS: 20.0000 mg | ORAL_TABLET | Freq: Every day | ORAL | 3 refills | Status: DC

## 2021-05-08 NOTE — Telephone Encounter (Signed)
Requested Prescriptions  ?Pending Prescriptions Disp Refills  ?? simvastatin (ZOCOR) 20 MG tablet 90 tablet 0  ?  Sig: Take 1 tablet (20 mg total) by mouth at bedtime.  ?  ? Cardiovascular:  Antilipid - Statins Failed - 05/08/2021  3:04 PM  ?  ?  Failed - Lipid Panel in normal range within the last 12 months  ?  Cholesterol, Total  ?Date Value Ref Range Status  ?04/29/2021 258 (H) 100 - 199 mg/dL Final  ? ?LDL Chol Calc (NIH)  ?Date Value Ref Range Status  ?04/29/2021 146 (H) 0 - 99 mg/dL Final  ? ?HDL  ?Date Value Ref Range Status  ?04/29/2021 38 (L) >39 mg/dL Final  ? ?Triglycerides  ?Date Value Ref Range Status  ?04/29/2021 399 (H) 0 - 149 mg/dL Final  ? ?  ?  ?  Passed - Patient is not pregnant  ?  ?  Passed - Valid encounter within last 12 months  ?  Recent Outpatient Visits   ?      ? 1 week ago Encounter for [employer] annual wellness exam  ? [facility] [place], Dionne Bucy, MD  ? 6 months ago Type 2 diabetes mellitus with other specified complication, without long-term current use of insulin ([place])  ? [facility] [place], Dionne Bucy, MD  ? 9 months ago Type 2 diabetes mellitus with other specified complication, without long-term current use of insulin ([place])  ? [facility] [place], Dionne Bucy, MD  ? 1 year ago Aspiration pneumonia of both upper lobes, unspecified aspiration pneumonia type [facility])  ? [place], PA-C  ? 1 year ago Urinary frequency  ? [place], PA-C  ?  ?  ?Future Appointments   ?        ? In 1 week End, Harrell Gave, MD [facility], [facility]  ? In 3 months Bacigal[place], Dionne Bucy, MD [facility], [facility]  ? In 5 months Bacigalupo, Dionne Bucy, MD [facility], [facility]  ?  ? ?  ?  ?  ? ?

## 2021-05-15 ENCOUNTER — Ambulatory Visit: Payer: Medicare Other | Admitting: Internal Medicine

## 2021-05-15 NOTE — Progress Notes (Deleted)
? ?Follow-up Outpatient Visit ?Date: 05/15/2021 ? ?Primary Care Provider: ?Virginia Crews, MD ?[address] ?[place] [address] ? ?Chief Complaint: *** ? ?HPI:  [name] is a 88 y.o. female with history of paroxysmal atrial fibrillation, hypertension, hyperlipidemia, type 2 diabetes mellitus, and recurrent syncope thought to be vasovagal in nature, who presents for follow-up of atrial fibrillation and syncope.  I last saw her in 11/2020, at which time she was concerned about progressive decline in her mobility secondary to balance problems.  Her daughter also noted some exertional dyspnea, slightly worse from prior visits.  Subsequent echo showed preserved LVEF with grade 1 diastolic dysfunction and no significant valvular abnormalities. ? ?-------------------------------------------------------------------------------------------------- ? ?Past Medical History:  ?Diagnosis Date  ? Basal cell carcinoma 03/30/2008  ? left upper arm/excision  ? Basal cell carcinoma 03/31/2008  ? left upper arm, right lower leg  ? Basal cell carcinoma 01/18/2009  ? left upper arm  ? Chronic diarrhea   ? Collagenous colitis   ? Diabetes mellitus without complication ([place])   ? Diverticulitis   ? Heart murmur   ? at birth, none since  ? History of echocardiogram   ? a. 11/2015: echo showing EF of 60-65% with no WMA. Grade 1 DD and mild MR noted.   ? Hypertension   ? Intertrochanteric fracture of right femur ([place]) 06/11/2019  ? PAF (paroxysmal atrial fibrillation) ([place])   ? a. initially diagnosed in 08/2016 --> started on Eliquis  ? Squamous cell carcinoma of skin 05/16/2020  ? Left nasal tip anterior to ala, refer to [facility]  ? Syncope   ? a. initially occurring in Fall 2016 b. Hospitalized in 10/2015 and 11/2015 for recurrent episodes.   ? ?Past Surgical History:  ?Procedure Laterality Date  ? ABDOMINAL HYSTERECTOMY    ? BACK SURGERY  08/08/2008  ? Lumbar discectomy  ? COLONOSCOPY WITH PROPOFOL    ? EXCISION/RELEASE  BURSA HIP Left 05/03/2019  ? Procedure: HIP ABDUCTOR REPAIR;  Surgeon: Hessie Knows, MD;  Location: [facility] ORS;  Service: Orthopedics;  Laterality: Left;  ? INTRAMEDULLARY (IM) NAIL INTERTROCHANTERIC Right 06/13/2019  ? Procedure: INTRAMEDULLARY (IM) NAIL INTERTROCHANTRIC;  Surgeon: Hessie Knows, MD;  Location: [facility] ORS;  Service: Orthopedics;  Laterality: Right;  ? NECK SURGERY  1980  ? ? ?No outpatient medications have been marked as taking for the 05/15/21 encounter (Appointment) with Ardis [place], Harrell Gave, MD.  ? ? ?Allergies: Morphine sulfate and Penicillins ? ?Social History  ? ?Tobacco Use  ? Smoking status: Former  ?  Types: Cigarettes  ?  Quit date: 03/09/1978  ?  Years since quitting: 43.2  ? Smokeless tobacco: Never  ?Vaping Use  ? Vaping Use: Never used  ?Substance Use Topics  ? Alcohol use: Yes  ?  Alcohol/week: 0.0 - 1.0 standard drinks  ?  Comment: once a month  ? Drug use: No  ? ? ?Family History  ?Problem Relation Age of Onset  ? Hypertension Mother   ? Diabetes Mother   ? Lung cancer Father   ? ? ?Review of Systems: ?A 12-system review of systems was performed and was negative except as noted in the HPI. ? ?-------------------------------------------------------------------------------------------------- ? ?Physical Exam: ?There were no vitals taken for this visit. ? ?General:  NAD. ?Neck: No JVD or HJR. ?Lungs: Clear to auscultation bilaterally without wheezes or crackles. ?Heart: Regular rate and rhythm without murmurs, rubs, or gallops. ?Abdomen: Soft, nontender, nondistended. ?Extremities: No lower extremity edema. ? ?EKG:  *** ? ?Lab Results  ?  Component Value Date  ? WBC 6.9 04/29/2021  ? HGB 13.6 04/29/2021  ? HCT 40.7 04/29/2021  ? MCV 92 04/29/2021  ? PLT 252 04/29/2021  ? ? ?Lab Results  ?Component Value Date  ? NA 138 04/29/2021  ? K 4.8 04/29/2021  ? CL 101 04/29/2021  ? CO2 24 04/29/2021  ? BUN 30 (H) 04/29/2021  ? CREATININE 0.93 04/29/2021  ? GLUCOSE 275 (H) 04/29/2021  ? ALT 30 04/29/2021   ? ? ?Lab Results  ?Component Value Date  ? CHOL 258 (H) 04/29/2021  ? HDL 38 (L) 04/29/2021  ? LDLCALC 146 (H) 04/29/2021  ? TRIG 399 (H) 04/29/2021  ? CHOLHDL 2.9 12/21/2018  ? ? ?-------------------------------------------------------------------------------------------------- ? ?ASSESSMENT AND PLAN: ?*** ? ?Nelva Bush, MD ?05/15/2021 ?8:15 AM ? ?

## 2021-05-16 ENCOUNTER — Encounter: Payer: Self-pay | Admitting: Podiatry

## 2021-05-16 ENCOUNTER — Ambulatory Visit (INDEPENDENT_AMBULATORY_CARE_PROVIDER_SITE_OTHER): Payer: Medicare Other

## 2021-05-16 ENCOUNTER — Ambulatory Visit: Payer: Medicare Other | Admitting: Podiatry

## 2021-05-16 ENCOUNTER — Other Ambulatory Visit: Payer: Self-pay

## 2021-05-16 DIAGNOSIS — M778 Other enthesopathies, not elsewhere classified: Secondary | ICD-10-CM | POA: Diagnosis not present

## 2021-05-16 DIAGNOSIS — M7751 Other enthesopathy of right foot: Secondary | ICD-10-CM | POA: Diagnosis not present

## 2021-05-21 ENCOUNTER — Encounter: Payer: Self-pay | Admitting: Podiatry

## 2021-05-21 NOTE — Progress Notes (Signed)
?Subjective:  ?Patient ID: [name], female    DOB: 1931-10-14,  MRN: [id number] ? ?Chief Complaint  ?Patient presents with  ? Foot Pain  ? ? ?88 y.o. female presents with the above complaint.  Patient presents with complaint of right second metatarsophalangeal joint.  Patient states is painful to touch painful to walk on.  She is a diabetic with last A1c of 11%.  She states it hurts with ambulation.  In the past injections have helped. ?To the base of the toe.  She is tried Tylenol.  Pain scale 7 out of 10 hurts with ambulation. ? ? ?Review of Systems: Negative except as noted in the HPI. Denies N/V/F/Ch. ? ?Past Medical History:  ?Diagnosis Date  ? Basal cell carcinoma 03/30/2008  ? left upper arm/excision  ? Basal cell carcinoma 03/31/2008  ? left upper arm, right lower leg  ? Basal cell carcinoma 01/18/2009  ? left upper arm  ? Chronic diarrhea   ? Collagenous colitis   ? Diabetes mellitus without complication ([place])   ? Diverticulitis   ? Heart murmur   ? at birth, none since  ? History of echocardiogram   ? a. 11/2015: echo showing EF of 60-65% with no WMA. Grade 1 DD and mild MR noted.   ? Hypertension   ? Intertrochanteric fracture of right femur ([place]) 06/11/2019  ? PAF (paroxysmal atrial fibrillation) ([place])   ? a. initially diagnosed in 08/2016 --> started on Eliquis  ? Squamous cell carcinoma of skin 05/16/2020  ? Left nasal tip anterior to ala, refer to [facility]  ? Syncope   ? a. initially occurring in Fall 2016 b. Hospitalized in 10/2015 and 11/2015 for recurrent episodes.   ? ? ?Current Outpatient Medications:  ?  acetaminophen (TYLENOL) 500 MG tablet, Take 2 tablets (1,000 mg total) by mouth 2 (two) times daily as needed (pain.)., Disp: 30 tablet, Rfl: 0 ?  Cholecalciferol (VITAMIN D) 2000 UNITS CAPS, Take 2,000 Units by mouth daily. , Disp: , Rfl:  ?  Clobetasol Propionate 0.05 % shampoo, 2-3 TIMES PER WEEK LATHER ON SCALP, LEAVE ON 20 MINUTES, WASH OUT., Disp: 118 mL, Rfl: 3 ?  ELIQUIS 5 MG TABS  tablet, TAKE 1 TABLET BY MOUTH TWICE DAILY, Disp: 180 tablet, Rfl: 1 ?  empagliflozin (JARDIANCE) 10 MG TABS tablet, Take 1 tablet (10 mg total) by mouth daily before breakfast., Disp: 90 tablet, Rfl: 1 ?  FLUOCINOLONE ACETONIDE SCALP EX, Apply 0.1 % topically daily as needed., Disp: , Rfl:  ?  furosemide (LASIX) 20 MG tablet, TAKE 1 TABLET BY MOUTH DAILY ONLY IF NEEDED., Disp: 90 tablet, Rfl: 1 ?  glipiZIDE (GLUCOTROL XL) 5 MG 24 hr tablet, TAKE ONE TABLET EACH MORNING WITH BREAKFAST, Disp: 90 tablet, Rfl: 1 ?  lisinopril (ZESTRIL) 20 MG tablet, TAKE 1 TABLET BY MOUTH DAILY, Disp: 90 tablet, Rfl: 0 ?  metoprolol succinate (TOPROL-XL) 25 MG 24 hr tablet, TAKE 1 TABLET BY MOUTH ONCE DAILY, Disp: 90 tablet, Rfl: 1 ?  Multiple Vitamin (MULTIVITAMIN WITH MINERALS) TABS tablet, Take 1 tablet by mouth daily., Disp: , Rfl:  ?  Multiple Vitamins-Minerals (HAIR SKIN AND NAILS FORMULA PO), Take 2 tablets by mouth daily at 6 (six) AM., Disp: , Rfl:  ?  mupirocin ointment (BACTROBAN) 2 %, Apply topically 2 (two) times daily., Disp: , Rfl:  ?  simvastatin (ZOCOR) 20 MG tablet, Take 1 tablet (20 mg total) by mouth at bedtime., Disp: 90 tablet, Rfl: 3 ? ?Social History  ? ?  Tobacco Use  ?Smoking Status Former  ? Types: Cigarettes  ? Quit date: 03/09/1978  ? Years since quitting: 43.2  ?Smokeless Tobacco Never  ? ? ?Allergies  ?Allergen Reactions  ? Morphine Sulfate Other (See Comments)  ?  BP bottoms out  ? Penicillins Diarrhea  ?  Has patient had a PCN reaction causing immediate rash, facial/tongue/throat swelling, SOB or lightheadedness with hypotension: Yes ?Has patient had a PCN reaction causing severe rash involving mucus membranes or skin necrosis: Yes ?Has patient had a PCN reaction that required hospitalization No ?Has patient had a PCN reaction occurring within the last 10 years: No ?If all of the above answers are "NO", then may proceed with Cephalosporin use.  ? ?Objective:  ?There were no vitals filed for this  visit. ?There is no height or weight on file to calculate BMI. ?Constitutional Well developed. ?Well nourished.  ?Vascular Dorsalis pedis pulses palpable bilaterally. ?Posterior tibial pulses palpable bilaterally. ?Capillary refill normal to all digits.  ?No cyanosis or clubbing noted. ?Pedal hair growth normal.  ?Neurologic Normal speech. ?Oriented to person, place, and time. ?Epicritic sensation to light touch grossly present bilaterally.  ?Dermatologic Nails well groomed and normal in appearance. ?No open wounds. ?No skin lesions.  ?Orthopedic: Pain on palpation to the right second metatarsophalangeal joint.  Pain on palpation to the joint.  No pain with range of motion of the second metatarsophalangeal joint.  No extensor or flexor tendinitis noted.  No deep intra-articular pain noted.  Negative plantar plate rupture  ? ?Radiographs: 3 views of skeletally mature the right foot: No stress fracture noted.  No actual fracture noted.  Generalized diffuse osteopenia noted.  Hammertoe contractures 2 through 5 noted.  No other bony abnormalities identified. ?Assessment:  ? ?1. Capsulitis of foot, right   ? ?Plan:  ?Patient was evaluated and treated and all questions answered. ? ?Right second metatarsophalangeal joint capsulitis ?-All questions and concerns w right second metatarsophalangeal joint ere discussed with the patient in extensive detail.  Given the amount of pain that she is experiencing I believe she will benefit from a steroid injection to help decrease acute inflammatory component associate with pain.  Patient agrees with the plan like to proceed with a steroid injection. ?-A steroid injection was performed at right second metatarsophalangeal joint using 1% plain Lidocaine and 10 mg of Kenalog. This was well tolerated. ? ? ?No follow-ups on file.  ?

## 2021-06-13 ENCOUNTER — Ambulatory Visit: Payer: Medicare Other | Admitting: Podiatry

## 2021-06-14 ENCOUNTER — Other Ambulatory Visit: Payer: Self-pay | Admitting: Dermatology

## 2021-06-14 DIAGNOSIS — L409 Psoriasis, unspecified: Secondary | ICD-10-CM

## 2021-06-28 ENCOUNTER — Ambulatory Visit: Payer: Medicare Other | Admitting: Internal Medicine

## 2021-06-28 ENCOUNTER — Encounter: Payer: Self-pay | Admitting: Internal Medicine

## 2021-06-28 VITALS — BP 120/66 | HR 67 | Ht 60.0 in | Wt 173.0 lb

## 2021-06-28 DIAGNOSIS — I480 Paroxysmal atrial fibrillation: Secondary | ICD-10-CM | POA: Diagnosis not present

## 2021-06-28 DIAGNOSIS — E785 Hyperlipidemia, unspecified: Secondary | ICD-10-CM | POA: Diagnosis not present

## 2021-06-28 DIAGNOSIS — R55 Syncope and collapse: Secondary | ICD-10-CM | POA: Diagnosis not present

## 2021-06-28 DIAGNOSIS — R0602 Shortness of breath: Secondary | ICD-10-CM | POA: Diagnosis not present

## 2021-06-28 DIAGNOSIS — E1169 Type 2 diabetes mellitus with other specified complication: Secondary | ICD-10-CM | POA: Diagnosis not present

## 2021-06-28 NOTE — Progress Notes (Signed)
? ?Follow-up Outpatient Visit ?Date: 06/28/2021 ? ?Primary Care Provider: ?Virginia Crews, MD ?[address] ?[place] [address] ? ?Chief Complaint: Follow-up atrial fibrillation and syncope ? ?HPI:  [name] is a 88 y.o. female with history of paroxysmal atrial fibrillation, hypertension, hyperlipidemia, type 2 diabetes mellitus, and recurrent syncope thought to be vasovagal in nature, who presents for follow-up of atrial fibrillation and syncope.  I last saw her in 11/2020, at which time [name] was most concerned about progressive decline in mobility related to balance problems.  Though [name] did not endorse dyspnea, her daughter was concerned about apparent shortness of breath, especially when "walking faster."  Subsequent echo was reassuring with normal LVEF, grade 1 diastolic dysfunction, and no significant valvular abnormality.  Previous MPI in 10/2019 was without ischemia or scar. ? ?Today, [name] reports that she has been feeling fairly well.  She still has occasional wheezing and apparent shortness of breath noted by her daughter, which is stable or may be slightly more frequent than at our prior visit.  [name] denies significant shortness of breath.  She has not had any palpitations, lightheadedness, or syncope.  She also denies chest pain.  She reports a recent episode of epistaxis that resolved with brief nasal compression.  She has occasional dependent edema that resolves when she uses her as needed furosemide.  Recent labs obtained by her PCP were notable for elevated lipids and glucose.  [name] believes these elevations were precipitated by dietary indiscretion.  She is now more mindful of eating desserts. ? ?-------------------------------------------------------------------------------------------------- ? ?Past Medical History:  ?Diagnosis Date  ? Basal cell carcinoma 03/30/2008  ? left upper arm/excision  ? Basal cell carcinoma 03/31/2008  ? left upper  arm, right lower leg  ? Basal cell carcinoma 01/18/2009  ? left upper arm  ? Chronic diarrhea   ? Collagenous colitis   ? Diabetes mellitus without complication ([employer])   ? Diverticulitis   ? Heart murmur   ? at birth, none since  ? History of echocardiogram   ? a. 11/2015: echo showing EF of 60-65% with no WMA. Grade 1 DD and mild MR noted.   ? Hypertension   ? Intertrochanteric fracture of right femur ([place]) 06/11/2019  ? PAF (paroxysmal atrial fibrillation) ([place])   ? a. initially diagnosed in 08/2016 --> started on Eliquis  ? Squamous cell carcinoma of skin 05/16/2020  ? Left nasal tip anterior to ala, refer to [facility]  ? Syncope   ? a. initially occurring in Fall 2016 b. Hospitalized in 10/2015 and 11/2015 for recurrent episodes.   ? ?Past Surgical History:  ?Procedure Laterality Date  ? ABDOMINAL HYSTERECTOMY    ? BACK SURGERY  08/08/2008  ? Lumbar discectomy  ? COLONOSCOPY WITH PROPOFOL    ? EXCISION/RELEASE BURSA HIP Left 05/03/2019  ? Procedure: HIP ABDUCTOR REPAIR;  Surgeon: Hessie Knows, MD;  Location: [facility] ORS;  Service: Orthopedics;  Laterality: Left;  ? INTRAMEDULLARY (IM) NAIL INTERTROCHANTERIC Right 06/13/2019  ? Procedure: INTRAMEDULLARY (IM) NAIL INTERTROCHANTRIC;  Surgeon: Hessie Knows, MD;  Location: [facility] ORS;  Service: Orthopedics;  Laterality: Right;  ? NECK SURGERY  1980  ? ? ?Current Meds  ?Medication Sig  ? acetaminophen (TYLENOL) 325 MG tablet Take 650 mg by mouth 2 (two) times daily.  ? acetaminophen (TYLENOL) 500 MG tablet Take 2 tablets (1,000 mg total) by mouth 2 (two) times daily as needed (pain.).  ? Cholecalciferol (VITAMIN D) 2000 UNITS CAPS Take 2,000  Units by mouth daily.   ? Clobetasol Propionate 0.05 % shampoo 2-3 TIMES PER WEEK LATHER ON SCALP, LEAVE ON 20 MINUTES, WASH OUT.  ? ELIQUIS 5 MG TABS tablet TAKE 1 TABLET BY MOUTH TWICE DAILY  ? empagliflozin (JARDIANCE) 10 MG TABS tablet Take 1 tablet (10 mg total) by mouth daily before breakfast.  ? FLUOCINOLONE ACETONIDE SCALP EX Apply 0.1  % topically daily as needed.  ? furosemide (LASIX) 20 MG tablet TAKE 1 TABLET BY MOUTH DAILY ONLY IF NEEDED.  ? glipiZIDE (GLUCOTROL XL) 5 MG 24 hr tablet TAKE ONE TABLET EACH MORNING WITH BREAKFAST  ? lisinopril (ZESTRIL) 20 MG tablet TAKE 1 TABLET BY MOUTH DAILY  ? metoprolol succinate (TOPROL-XL) 25 MG 24 hr tablet TAKE 1 TABLET BY MOUTH ONCE DAILY  ? Multiple Vitamin (MULTIVITAMIN WITH MINERALS) TABS tablet Take 1 tablet by mouth daily.  ? Multiple Vitamins-Minerals (HAIR SKIN AND NAILS FORMULA PO) Take 1 tablet by mouth daily at 6 (six) AM.  ? mupirocin ointment (BACTROBAN) 2 % Apply topically 2 (two) times daily.  ? simvastatin (ZOCOR) 20 MG tablet Take 1 tablet (20 mg total) by mouth at bedtime.  ? ? ?Allergies: Morphine sulfate and Penicillins ? ?Social History  ? ?Tobacco Use  ? Smoking status: Former  ?  Types: Cigarettes  ?  Quit date: 03/09/1978  ?  Years since quitting: 43.3  ? Smokeless tobacco: Never  ?Vaping Use  ? Vaping Use: Never used  ?Substance Use Topics  ? Alcohol use: Yes  ?  Alcohol/week: 0.0 - 1.0 standard drinks  ?  Comment: once a month  ? Drug use: No  ? ? ?Family History  ?Problem Relation Age of Onset  ? Hypertension Mother   ? Diabetes Mother   ? Lung cancer Father   ? ? ?Review of Systems: ?A 12-system review of systems was performed and was negative except as noted in the HPI. ? ?-------------------------------------------------------------------------------------------------- ? ?Physical Exam: ?BP 120/66 (BP Location: Left Arm, Patient Position: Sitting, Cuff Size: Large)   Pulse 67   Ht 5' (1.524 m)   Wt 173 lb (78.5 kg)   SpO2 96%   BMI 33.79 kg/m?  ? ?General:  NAD. ?Neck: No JVD or HJR. ?Lungs: Clear to auscultation bilaterally without wheezes or crackles. ?Heart: Regular rate and rhythm with occasional extrasystoles and 2/6 systolic murmur. ?Abdomen: Soft, nontender, nondistended. ?Extremities: Trace pretibial edema bilaterally. ? ?EKG: Normal sinus rhythm with PACs.   Otherwise no significant abnormality.  Compared to prior tracing from 11/14/2020, PVCs are no longer present. ? ?Lab Results  ?Component Value Date  ? WBC 6.9 04/29/2021  ? HGB 13.6 04/29/2021  ? HCT 40.7 04/29/2021  ? MCV 92 04/29/2021  ? PLT 252 04/29/2021  ? ? ?Lab Results  ?Component Value Date  ? NA 138 04/29/2021  ? K 4.8 04/29/2021  ? CL 101 04/29/2021  ? CO2 24 04/29/2021  ? BUN 30 (H) 04/29/2021  ? CREATININE 0.93 04/29/2021  ? GLUCOSE 275 (H) 04/29/2021  ? ALT 30 04/29/2021  ? ? ?Lab Results  ?Component Value Date  ? CHOL 258 (H) 04/29/2021  ? HDL 38 (L) 04/29/2021  ? LDLCALC 146 (H) 04/29/2021  ? TRIG 399 (H) 04/29/2021  ? CHOLHDL 2.9 12/21/2018  ? ? ?-------------------------------------------------------------------------------------------------- ? ?ASSESSMENT AND PLAN: ?Paroxysmal atrial fibrillation: ?[name] remains in sinus rhythm today and has not had any palpitations or other symptoms to suggest recurrence of atrial fibrillation.  We will continue her current regimen  of metoprolol and apixaban in the setting of a CHA2DS2-VASc score of at least 5. ? ?Vasovagal syncope: ?No significant lightheadedness or recurrent syncope reported.  I encouraged [name] to continue to work on staying well-hydrated, which she has been focusing on.  No further work-up recommended at this time. ? ?Shortness of breath: ?This seems fairly stable.  Echocardiogram last fall did not show any significant abnormalities to explain her shortness of breath.  MPI in 2021 was also without ischemia or scar.  If symptoms worsen, consultation with pulmonary might be helpful.  We will defer medication changes and additional testing today. ? ?Hyperlipidemia associated with type 2 diabetes mellitus: ?Labs earlier this year were notable for significant elevations in triglycerides, LDL, and glucose.  [name] believes that preceding dietary indiscretion may have led to these findings.  She is working on lifestyle modifications and  is scheduled for follow-up with Dr. Brita Romp in June for reevaluation.  I think it is reasonable to continue her current dose of simvastatin.  Ongoing management of hyperlipidemia and diabetes per Dr. Bridgette Habermann

## 2021-06-28 NOTE — Patient Instructions (Signed)
Medication Instructions:   Your physician recommends that you continue on your current medications as directed. Please refer to the Current Medication list given to you today.  *If you need a refill on your cardiac medications before your next appointment, please call your pharmacy*   Lab Work:  None ordered  Testing/Procedures:  None ordered   Follow-Up: At [facility], you and your health needs are our priority.  As part of our continuing mission to provide you with exceptional heart care, we have created designated Provider Care Teams.  These Care Teams include your primary Cardiologist (physician) and Advanced Practice Providers (APPs -  Physician Assistants and Nurse Practitioners) who all work together to provide you with the care you need, when you need it.  We recommend signing up for the patient portal called "MyChart".  Sign up information is provided on this After Visit Summary.  MyChart is used to connect with patients for Virtual Visits (Telemedicine).  Patients are able to view lab/test results, encounter notes, upcoming appointments, etc.  Non-urgent messages can be sent to your provider as well.   To learn more about what you can do with MyChart, go to [email or web address].    Your next appointment:   6 month(s)  The format for your next appointment:   In Person  Provider:   You may see Christopher End, MD or one of the following Advanced Practice Providers on your designated Care Team:   Christopher Berge, NP Ryan Dunn, PA-C Cadence Furth, PA-C{   Important Information About Sugar       

## 2021-07-03 ENCOUNTER — Other Ambulatory Visit: Payer: Self-pay | Admitting: Internal Medicine

## 2021-07-03 NOTE — Telephone Encounter (Signed)
Prescription refill request for Eliquis received. ?Indication: PAF ?Last office visit: 06/28/21  C End MD ?Scr: 0.93 on 04/29/21 ?Age: 88 ?Weight: 78.5kg ? ?Based on above findings Eliquis '5mg'$  twice daily is the appropriate dose.  Refill approved. ? ?

## 2021-07-03 NOTE — Telephone Encounter (Signed)
Refill request

## 2021-07-15 ENCOUNTER — Other Ambulatory Visit: Payer: Self-pay | Admitting: Internal Medicine

## 2021-07-16 ENCOUNTER — Encounter: Payer: Self-pay | Admitting: Family Medicine

## 2021-07-16 LAB — COMPREHENSIVE METABOLIC PANEL
Albumin: 3.7 (ref 3.5–5.0)
Calcium: 9.7 (ref 8.7–10.7)
Globulin: 2.4
eGFR: 51

## 2021-07-16 LAB — HEPATIC FUNCTION PANEL
AST: 12 — AB (ref 13–35)
Alkaline Phosphatase: 49 (ref 25–125)

## 2021-07-16 LAB — LIPID PANEL
Cholesterol: 146 (ref 0–200)
HDL: 41 (ref 35–70)
LDL Cholesterol: 78
Triglycerides: 200 — AB (ref 40–160)

## 2021-07-16 LAB — BASIC METABOLIC PANEL
BUN: 31 — AB (ref 4–21)
CO2: 28 — AB (ref 13–22)
Chloride: 102 (ref 99–108)
Creatinine: 1.1 (ref 0.5–1.1)
Glucose: 239
Potassium: 4.4 mEq/L (ref 3.5–5.1)
Sodium: 137 (ref 137–147)

## 2021-07-16 LAB — HEMOGLOBIN A1C: Hemoglobin A1C: 10.9

## 2021-07-16 LAB — CBC AND DIFFERENTIAL
HCT: 42 (ref 36–46)
Hemoglobin: 13.9 (ref 12.0–16.0)
Platelets: 227 10*3/uL (ref 150–400)
WBC: 7.6

## 2021-07-16 LAB — CBC: RBC: 4.53 (ref 3.87–5.11)

## 2021-07-16 LAB — TSH: TSH: 2.04 (ref 0.41–5.90)

## 2021-07-18 ENCOUNTER — Telehealth: Payer: Self-pay

## 2021-07-18 MED ORDER — NITROFURANTOIN MONOHYD MACRO 100 MG PO CAPS: 100.0000 mg | ORAL_CAPSULE | Freq: Two times a day (BID) | ORAL | 0 refills | Status: DC

## 2021-07-18 NOTE — Telephone Encounter (Signed)
Returned call to [facility] and advised of Dr. Sharmaine Base message below. Prescription sent to [facility] pharmacy. I also faxed an order to [facility] per their request.  ?

## 2021-07-18 NOTE — Telephone Encounter (Signed)
Copy of urinalysis and urine culture results received via fax and placed in Dr. Sharmaine Base box for review.  ?

## 2021-07-18 NOTE — Telephone Encounter (Signed)
UA shows Enterococcus without sensitivities done.  Treat with Macrobid '100mg'$  BID x7d #14 r0. Please send in to her pharmacy. Will scan urine results to chart.

## 2021-07-18 NOTE — Telephone Encounter (Signed)
Copied from [place]. Topic: General - Other ?>> Jul 18, 2021 11:33 AM Pawlus, Brayton Layman A wrote: ?Reason for CRM: Caller from [facility] will fax over pts lab results, please advise if they have been received. Urine culture was taken at the senior home and done by [facility]. ?

## 2021-07-18 NOTE — Telephone Encounter (Signed)
Nope. I haven't seen it.  I have completed everything that has come through and have no outstanding paperwork. Maybe they can resend it. ?

## 2021-07-18 NOTE — Telephone Encounter (Signed)
PEC Please advise as below.  ?

## 2021-07-18 NOTE — Telephone Encounter (Signed)
Left detailed voicemail for patient and Debbie with resident services coordinator [facility]  [place]. advising of ABX sent to pharmacy.  ?

## 2021-07-18 NOTE — Telephone Encounter (Signed)
Peter RN called from [facility], verbalized understanding of update from PCP. I confirmed the fax number and he plans to resend fax of UA and Culture ?

## 2021-07-18 NOTE — Telephone Encounter (Signed)
Has a fax come through on this patient? See message below. ?

## 2021-07-19 ENCOUNTER — Ambulatory Visit: Payer: Self-pay

## 2021-07-19 ENCOUNTER — Telehealth: Payer: Self-pay | Admitting: Family Medicine

## 2021-07-19 NOTE — Telephone Encounter (Signed)
Pt lives at [facility]. Ask for [name] for new orders. Phone number under contacts also.[name] Salina RN can be reached at [phone] ? ?Chief Complaint: BS 328 ?Symptoms: weak dizzy ?Frequency: this morning ?Pertinent Negatives: Patient denies did not speak to pt ?Disposition: '[]'$ ED /'[]'$ Urgent Care (no appt availability in office) / '[]'$ Appointment(In office/virtual)/ '[]'$  [place] [facility]/ '[]'$ Home Care/ '[]'$ Refused Recommended Disposition /'[]'$  Mobile Bus/ '[x]'$  Follow-up with PCP ?Additional Notes: pt has UTI and started Abx yesterday. Labs were drawn and Hgb A1C 10.9%. Pt's BS 328. VS 150/76, HR 84, 96% O2 sat, Temp 98.0. Pt takes Jardiance and glipizide.  ? ? ? ? ?Reason for Disposition ? [1] Symptoms of high blood sugar (e.g., frequent urination, weak, weight loss) AND [2] not able to test blood glucose ? ?Answer Assessment - Initial Assessment Questions ?1. BLOOD GLUCOSE: "What is your blood glucose level?"  ?    328 ?2. ONSET: "When did you check the blood glucose?" ?    1030 today ? ?5. TYPE 1 or 2:  "Do you know what type of diabetes you have?"  (e.g., Type 1, Type 2, Gestational; doesn't know)  ?    Type 2 ?6. INSULIN: "Do you take insulin?" "What type of insulin(s) do you use? What is the mode of delivery? (syringe, pen; injection or pump)?"  ?    no ?7. DIABETES PILLS: "Do you take any pills for your diabetes?" If yes, ask: "Have you missed taking any pills recently?" ?    yes ?8. OTHER SYMPTOMS: "Do you have any symptoms?" (e.g., fever, frequent urination, difficulty breathing, dizziness, weakness, vomiting) ?    Feels weak dizzy ? ?Protocols used: Diabetes - High Blood Sugar-A-AH ? ?

## 2021-07-19 NOTE — Telephone Encounter (Signed)
Left detailed voicemail for [name] advising of UC/ED.  ?

## 2021-07-22 ENCOUNTER — Encounter: Payer: Self-pay | Admitting: Family Medicine

## 2021-07-22 ENCOUNTER — Ambulatory Visit: Payer: Medicare Other | Admitting: Family Medicine

## 2021-07-22 VITALS — BP 106/56 | HR 85 | Temp 97.4°F | Resp 16 | Ht 63.0 in | Wt 169.4 lb

## 2021-07-22 DIAGNOSIS — E1100 Type 2 diabetes mellitus with hyperosmolarity without nonketotic hyperglycemic-hyperosmolar coma (NKHHC): Secondary | ICD-10-CM | POA: Diagnosis not present

## 2021-07-22 DIAGNOSIS — I10 Essential (primary) hypertension: Secondary | ICD-10-CM

## 2021-07-22 DIAGNOSIS — E785 Hyperlipidemia, unspecified: Secondary | ICD-10-CM

## 2021-07-22 DIAGNOSIS — Z66 Do not resuscitate: Secondary | ICD-10-CM | POA: Diagnosis not present

## 2021-07-22 DIAGNOSIS — N390 Urinary tract infection, site not specified: Secondary | ICD-10-CM

## 2021-07-22 DIAGNOSIS — I480 Paroxysmal atrial fibrillation: Secondary | ICD-10-CM

## 2021-07-22 DIAGNOSIS — E1169 Type 2 diabetes mellitus with other specified complication: Secondary | ICD-10-CM

## 2021-07-22 MED ORDER — EMPAGLIFLOZIN 25 MG PO TABS: 25.0000 mg | ORAL_TABLET | Freq: Every day | ORAL | 11 refills | Status: DC

## 2021-07-22 NOTE — Telephone Encounter (Signed)
Spoke to Neoma Laming advising to come in for an appt today or tomorrow with Dr. Jacinto Reap or Ria Comment.  ?

## 2021-07-22 NOTE — Telephone Encounter (Signed)
Lmtcb for [facility] at [facility].  ?

## 2021-07-22 NOTE — Telephone Encounter (Signed)
Scheduled appt with Dr. Rosanna Randy for 2 pm on 5/15

## 2021-07-22 NOTE — Progress Notes (Signed)
Established patient visit   [name]   DOB: May 19, 1931   88 y.o. Female  MRN: [id number] Visit Date: 07/22/2021  Today's healthcare provider: Wilhemena Durie, MD    I,Tiffany Darlina Sicilian as a scribe for Wilhemena Durie, MD.,have documented all relevant documentation on the behalf of Wilhemena Durie, MD,as directed by  Wilhemena Durie, MD while in the presence of Wilhemena Durie, MD.   No chief complaint on file.  Subjective    HPI  Patient is an 88 year old female who was recently being treated for a UTI.  She reports that she has had elevated blood sugars levels. Patient complains of continued dry mouth, excessive thirst, urinary frequency, and no appetite. Daughter states she was gone for over a week and when she came back she thought her mother seemed depressed.  Her is with the patient and they are very does not.  Patient's only complaint is 1 of dry mouth and thirst.  Not much of an appetite.  Is only on the last few days.  She has no dysuria no pains anywhere no shortness of breath.  Good spirits and wants to go to [place] to the casino today.  Medications: Outpatient Medications Prior to Visit  Medication Sig   acetaminophen (TYLENOL) 325 MG tablet Take 650 mg by mouth 2 (two) times daily.   acetaminophen (TYLENOL) 500 MG tablet Take 2 tablets (1,000 mg total) by mouth 2 (two) times daily as needed (pain.).   Cholecalciferol (VITAMIN D) 2000 UNITS CAPS Take 2,000 Units by mouth daily.    Clobetasol Propionate 0.05 % shampoo 2-3 TIMES PER WEEK LATHER ON SCALP, LEAVE ON 20 MINUTES, WASH OUT.   ELIQUIS 5 MG TABS tablet TAKE 1 TABLET BY MOUTH TWICE DAILY   empagliflozin (JARDIANCE) 10 MG TABS tablet Take 1 tablet (10 mg total) by mouth daily before breakfast.   FLUOCINOLONE ACETONIDE SCALP EX Apply 0.1 % topically daily as needed.   furosemide (LASIX) 20 MG tablet TAKE 1 TABLET BY MOUTH DAILY ONLY IF NEEDED.   glipiZIDE (GLUCOTROL XL) 5 MG  24 hr tablet TAKE ONE TABLET EACH MORNING WITH BREAKFAST   lisinopril (ZESTRIL) 20 MG tablet TAKE 1 TABLET BY MOUTH DAILY   metoprolol succinate (TOPROL-XL) 25 MG 24 hr tablet TAKE 1 TABLET BY MOUTH ONCE DAILY   Multiple Vitamin (MULTIVITAMIN WITH MINERALS) TABS tablet Take 1 tablet by mouth daily.   Multiple Vitamins-Minerals (HAIR SKIN AND NAILS FORMULA PO) Take 1 tablet by mouth daily at 6 (six) AM.   mupirocin ointment (BACTROBAN) 2 % Apply topically 2 (two) times daily.   nitrofurantoin, macrocrystal-monohydrate, (MACROBID) 100 MG capsule Take 1 capsule (100 mg total) by mouth 2 (two) times daily for 7 days.   simvastatin (ZOCOR) 20 MG tablet Take 1 tablet (20 mg total) by mouth at bedtime.   No facility-administered medications prior to visit.    Review of Systems  Last hemoglobin A1c Lab Results  Component Value Date   HGBA1C 10.9 07/16/2021       Objective    There were no vitals taken for this visit. BP Readings from Last 3 Encounters:  07/26/21 139/67  07/24/21 128/80  07/22/21 (!) 106/56   Wt Readings from Last 3 Encounters:  07/24/21 169 lb 12.1 oz (77 kg)  07/22/21 169 lb 6.4 oz (76.8 kg)  06/28/21 173 lb (78.5 kg)      Physical Exam Vitals reviewed.  Constitutional:  General: She is not in acute distress.    Appearance: She is well-developed.  HENT:     Head: Normocephalic and atraumatic.     Right Ear: Hearing normal.     Left Ear: Hearing normal.     Nose: Nose normal.  Eyes:     General: Lids are normal. No scleral icterus.       Right eye: No discharge.        Left eye: No discharge.     Conjunctiva/sclera: Conjunctivae normal.  Cardiovascular:     Rate and Rhythm: Normal rate and regular rhythm.     Heart sounds: Normal heart sounds.  Pulmonary:     Effort: Pulmonary effort is normal. No respiratory distress.  Abdominal:     Palpations: Abdomen is soft.     Tenderness: There is no abdominal tenderness. There is no right CVA tenderness or  left CVA tenderness.  Skin:    Findings: No lesion or rash.  Neurological:     General: No focal deficit present.     Mental Status: She is alert and oriented to person, place, and time.  Psychiatric:        Mood and Affect: Mood normal.        Speech: Speech normal.        Behavior: Behavior normal.        Thought Content: Thought content normal.        Judgment: Judgment normal.      Results for orders placed or performed in visit on 07/22/21  CBC and differential  Result Value Ref Range   Hemoglobin 13.9 12.0 - 16.0   HCT 42 36 - 46   Platelets 227 150 - 400 K/uL   WBC 7.6   CBC  Result Value Ref Range   RBC 4.53 3.87 - 6.01  Basic metabolic panel  Result Value Ref Range   Glucose 239    BUN 31 (A) 4 - 21   CO2 28 (A) 13 - 22   Creatinine 1.1 0.5 - 1.1   Potassium 4.4 3.5 - 5.1 mEq/L   Sodium 137 137 - 147   Chloride 102 99 - 108  Comprehensive metabolic panel  Result Value Ref Range   Globulin 2.4    eGFR 51    Calcium 9.7 8.7 - 10.7   Albumin 3.7 3.5 - 5.0  Lipid panel  Result Value Ref Range   Triglycerides 200 (A) 40 - 160   Cholesterol 146 0 - 200   HDL 41 35 - 70   LDL Cholesterol 78   Hepatic function panel  Result Value Ref Range   Alkaline Phosphatase 49 25 - 125   AST 12 (A) 13 - 35  Hemoglobin A1c  Result Value Ref Range   Hemoglobin A1C 10.9   TSH  Result Value Ref Range   TSH 2.04 0.41 - 5.90    Assessment & Plan     1. Type 2 diabetes mellitus with hyperosmolarity without coma, without long-term current use of insulin ([place]) About diabetes and causes of hypoglycemia that any illness can cause it.  However to push fluids at this time. Not a fan of glipizide but would increase the dose if her hyperglycemia continues. 2. DNR (do not resuscitate)   3. Urinary tract infection without hematuria, site unspecified Recently treated UTI with negative urine today and no symptoms really.  4. Essential hypertension Good blood pressure  control.  5. Hyperlipidemia associated with type 2 diabetes mellitus ([place])  Simvastatin  6. Paroxysmal atrial fibrillation (HCC) On Eliquis   No follow-ups on file.      I, Wilhemena Durie, MD, have reviewed all documentation for this visit. The documentation on 07/26/21 for the exam, diagnosis, procedures, and orders are all accurate and complete.    Josilynn Losh Cranford Mon, MD  [facility] [phone] (phone) [phone] (fax)  [place]

## 2021-07-22 NOTE — Telephone Encounter (Signed)
Can also get her an appt in our office if there are availabilities. ?

## 2021-07-24 ENCOUNTER — Ambulatory Visit: Payer: Medicare Other | Admitting: Family Medicine

## 2021-07-24 ENCOUNTER — Emergency Department: Payer: Medicare Other

## 2021-07-24 ENCOUNTER — Ambulatory Visit: Payer: Self-pay

## 2021-07-24 ENCOUNTER — Encounter: Payer: Self-pay | Admitting: Family Medicine

## 2021-07-24 ENCOUNTER — Inpatient Hospital Stay
Admission: EM | Admit: 2021-07-24 | Discharge: 2021-07-26 | DRG: 439 | Disposition: A | Discharge status: Nursing Home (Includes ICF) | Payer: Medicare Other | Source: Skilled Nursing Facility | Attending: Internal Medicine | Admitting: Internal Medicine

## 2021-07-24 VITALS — BP 128/80 | HR 90 | Temp 97.6°F

## 2021-07-24 DIAGNOSIS — Z7984 Long term (current) use of oral hypoglycemic drugs: Secondary | ICD-10-CM | POA: Diagnosis not present

## 2021-07-24 DIAGNOSIS — Z833 Family history of diabetes mellitus: Secondary | ICD-10-CM | POA: Diagnosis not present

## 2021-07-24 DIAGNOSIS — Z794 Long term (current) use of insulin: Secondary | ICD-10-CM | POA: Diagnosis not present

## 2021-07-24 DIAGNOSIS — Z85828 Personal history of other malignant neoplasm of skin: Secondary | ICD-10-CM

## 2021-07-24 DIAGNOSIS — Z7901 Long term (current) use of anticoagulants: Secondary | ICD-10-CM

## 2021-07-24 DIAGNOSIS — N390 Urinary tract infection, site not specified: Secondary | ICD-10-CM | POA: Diagnosis present

## 2021-07-24 DIAGNOSIS — Z79899 Other long term (current) drug therapy: Secondary | ICD-10-CM

## 2021-07-24 DIAGNOSIS — E1165 Type 2 diabetes mellitus with hyperglycemia: Secondary | ICD-10-CM | POA: Diagnosis present

## 2021-07-24 DIAGNOSIS — Z9189 Other specified personal risk factors, not elsewhere classified: Secondary | ICD-10-CM

## 2021-07-24 DIAGNOSIS — N179 Acute kidney failure, unspecified: Secondary | ICD-10-CM | POA: Diagnosis present

## 2021-07-24 DIAGNOSIS — E11649 Type 2 diabetes mellitus with hypoglycemia without coma: Secondary | ICD-10-CM | POA: Diagnosis not present

## 2021-07-24 DIAGNOSIS — Z8249 Family history of ischemic heart disease and other diseases of the circulatory system: Secondary | ICD-10-CM

## 2021-07-24 DIAGNOSIS — Z880 Allergy status to penicillin: Secondary | ICD-10-CM

## 2021-07-24 DIAGNOSIS — Z885 Allergy status to narcotic agent status: Secondary | ICD-10-CM

## 2021-07-24 DIAGNOSIS — I10 Essential (primary) hypertension: Secondary | ICD-10-CM | POA: Diagnosis present

## 2021-07-24 DIAGNOSIS — R7989 Other specified abnormal findings of blood chemistry: Secondary | ICD-10-CM | POA: Diagnosis not present

## 2021-07-24 DIAGNOSIS — K859 Acute pancreatitis without necrosis or infection, unspecified: Principal | ICD-10-CM | POA: Diagnosis present

## 2021-07-24 DIAGNOSIS — K853 Drug induced acute pancreatitis without necrosis or infection: Secondary | ICD-10-CM | POA: Diagnosis not present

## 2021-07-24 DIAGNOSIS — Z87891 Personal history of nicotine dependence: Secondary | ICD-10-CM

## 2021-07-24 DIAGNOSIS — Z90710 Acquired absence of both cervix and uterus: Secondary | ICD-10-CM

## 2021-07-24 DIAGNOSIS — E785 Hyperlipidemia, unspecified: Secondary | ICD-10-CM | POA: Diagnosis present

## 2021-07-24 DIAGNOSIS — Z66 Do not resuscitate: Secondary | ICD-10-CM | POA: Diagnosis present

## 2021-07-24 DIAGNOSIS — E1100 Type 2 diabetes mellitus with hyperosmolarity without nonketotic hyperglycemic-hyperosmolar coma (NKHHC): Secondary | ICD-10-CM

## 2021-07-24 DIAGNOSIS — I480 Paroxysmal atrial fibrillation: Secondary | ICD-10-CM | POA: Diagnosis present

## 2021-07-24 HISTORY — DX: Acute pancreatitis without necrosis or infection, unspecified: K85.90

## 2021-07-24 LAB — URINALYSIS, ROUTINE W REFLEX MICROSCOPIC
Bacteria, UA: NONE SEEN
Bilirubin Urine: NEGATIVE
Glucose, UA: >=500 mg/dL — AB
Hgb urine dipstick: NEGATIVE
Ketones, ur: 20 mg/dL — AB
Leukocytes,Ua: NEGATIVE
Nitrite: NEGATIVE
Protein, ur: NEGATIVE mg/dL
Specific Gravity, Urine: 1.025 (ref 1.005–1.030)
pH: 5 (ref 5.0–8.0)

## 2021-07-24 LAB — COMPREHENSIVE METABOLIC PANEL
ALT: 281 U/L — ABNORMAL HIGH (ref 0–44)
AST: 172 U/L — ABNORMAL HIGH (ref 15–41)
Albumin: 3.8 g/dL (ref 3.5–5.0)
Alkaline Phosphatase: 294 U/L — ABNORMAL HIGH (ref 38–126)
Anion gap: 14 (ref 5–15)
BUN: 35 mg/dL — ABNORMAL HIGH (ref 8–23)
CO2: 22 mmol/L (ref 22–32)
Calcium: 9.4 mg/dL (ref 8.9–10.3)
Chloride: 99 mmol/L (ref 98–111)
Creatinine, Ser: 1.3 mg/dL — ABNORMAL HIGH (ref 0.44–1.00)
GFR, Estimated: 40 mL/min — ABNORMAL LOW (ref >60)
Glucose, Bld: 361 mg/dL — ABNORMAL HIGH (ref 70–99)
Potassium: 5 mmol/L (ref 3.5–5.1)
Sodium: 135 mmol/L (ref 135–145)
Total Bilirubin: 0.8 mg/dL (ref 0.3–1.2)
Total Protein: 8 g/dL (ref 6.5–8.1)

## 2021-07-24 LAB — CBC WITH DIFFERENTIAL/PLATELET
Abs Immature Granulocytes: 0.09 10*3/uL — ABNORMAL HIGH (ref 0.00–0.07)
Basophils Absolute: 0.1 10*3/uL (ref 0.0–0.1)
Basophils Relative: 1 %
Eosinophils Absolute: 0.5 10*3/uL (ref 0.0–0.5)
Eosinophils Relative: 4 %
HCT: 46.4 % — ABNORMAL HIGH (ref 36.0–46.0)
Hemoglobin: 15.1 g/dL — ABNORMAL HIGH (ref 12.0–15.0)
Immature Granulocytes: 1 %
Lymphocytes Relative: 15 %
Lymphs Abs: 1.6 10*3/uL (ref 0.7–4.0)
MCH: 30.1 pg (ref 26.0–34.0)
MCHC: 32.5 g/dL (ref 30.0–36.0)
MCV: 92.4 fL (ref 80.0–100.0)
Monocytes Absolute: 1 10*3/uL (ref 0.1–1.0)
Monocytes Relative: 9 %
Neutro Abs: 7.5 10*3/uL (ref 1.7–7.7)
Neutrophils Relative %: 70 %
Platelets: 279 10*3/uL (ref 150–400)
RBC: 5.02 MIL/uL (ref 3.87–5.11)
RDW: 12 % (ref 11.5–15.5)
WBC: 10.8 10*3/uL — ABNORMAL HIGH (ref 4.0–10.5)
nRBC: 0 % (ref 0.0–0.2)

## 2021-07-24 LAB — PROTIME-INR
INR: 1.5 — ABNORMAL HIGH (ref 0.8–1.2)
Prothrombin Time: 18.3 seconds — ABNORMAL HIGH (ref 11.4–15.2)

## 2021-07-24 LAB — LACTIC ACID, PLASMA
Lactic Acid, Venous: 1.6 mmol/L (ref 0.5–1.9)
Lactic Acid, Venous: 1.6 mmol/L (ref 0.5–1.9)

## 2021-07-24 LAB — LIPASE, BLOOD: Lipase: 1032 U/L — ABNORMAL HIGH (ref 11–51)

## 2021-07-24 LAB — GLUCOSE, POCT (MANUAL RESULT ENTRY): POC Glucose: 368 mg/dl — AB (ref 70–99)

## 2021-07-24 LAB — ACETAMINOPHEN LEVEL: Acetaminophen (Tylenol), Serum: <10 ug/mL — ABNORMAL LOW (ref 10–30)

## 2021-07-24 LAB — TROPONIN I (HIGH SENSITIVITY)
Troponin I (High Sensitivity): 11 ng/L (ref <18)
Troponin I (High Sensitivity): 12 ng/L (ref <18)

## 2021-07-24 LAB — APTT: aPTT: 36 seconds (ref 24–36)

## 2021-07-24 LAB — CBG MONITORING, ED: Glucose-Capillary: 247 mg/dL — ABNORMAL HIGH (ref 70–99)

## 2021-07-24 LAB — GLUCOSE, CAPILLARY: Glucose-Capillary: 191 mg/dL — ABNORMAL HIGH (ref 70–99)

## 2021-07-24 MED ORDER — ACETAMINOPHEN 650 MG RE SUPP: 650.0000 mg | Freq: Four times a day (QID) | RECTAL | Status: DC | PRN

## 2021-07-24 MED ORDER — SODIUM CHLORIDE 0.9 % IV SOLN: INTRAVENOUS | Status: DC

## 2021-07-24 MED ORDER — ADULT MULTIVITAMIN W/MINERALS CH
1.0000 | ORAL_TABLET | Freq: Every day | ORAL | Status: DC
  Administered 2021-07-25 – 2021-07-26 (×2): 1 via ORAL
  Filled 2021-07-24 (×2): qty 1

## 2021-07-24 MED ORDER — ONDANSETRON HCL 4 MG PO TABS: 4.0000 mg | ORAL_TABLET | Freq: Four times a day (QID) | ORAL | Status: DC | PRN

## 2021-07-24 MED ORDER — ACETAMINOPHEN 325 MG PO TABS: 650.0000 mg | ORAL_TABLET | Freq: Four times a day (QID) | ORAL | Status: DC | PRN

## 2021-07-24 MED ORDER — METOPROLOL SUCCINATE ER 25 MG PO TB24
25.0000 mg | ORAL_TABLET | Freq: Every day | ORAL | Status: DC
  Administered 2021-07-25 – 2021-07-26 (×2): 25 mg via ORAL
  Filled 2021-07-24 (×2): qty 1

## 2021-07-24 MED ORDER — LISINOPRIL 20 MG PO TABS: 20.0000 mg | ORAL_TABLET | Freq: Every day | ORAL | Status: DC

## 2021-07-24 MED ORDER — MAGNESIUM HYDROXIDE 400 MG/5ML PO SUSP: 30.0000 mL | Freq: Every day | ORAL | Status: DC | PRN

## 2021-07-24 MED ORDER — ENOXAPARIN SODIUM 40 MG/0.4ML IJ SOSY: 40.0000 mg | PREFILLED_SYRINGE | INTRAMUSCULAR | Status: DC

## 2021-07-24 MED ORDER — SODIUM CHLORIDE 0.9 % IV BOLUS
1000.0000 mL | Freq: Once | INTRAVENOUS | Status: AC
  Administered 2021-07-24: 1000 mL via INTRAVENOUS

## 2021-07-24 MED ORDER — VITAMIN D 25 MCG (1000 UNIT) PO TABS
2000.0000 [IU] | ORAL_TABLET | Freq: Every day | ORAL | Status: DC
  Administered 2021-07-25 – 2021-07-26 (×2): 2000 [IU] via ORAL
  Filled 2021-07-24 (×2): qty 2

## 2021-07-24 MED ORDER — HAIR SKIN AND NAILS FORMULA PO TABS: ORAL_TABLET | Freq: Every day | ORAL | Status: DC

## 2021-07-24 MED ORDER — APIXABAN 5 MG PO TABS
5.0000 mg | ORAL_TABLET | Freq: Two times a day (BID) | ORAL | Status: DC
  Administered 2021-07-24 – 2021-07-26 (×4): 5 mg via ORAL
  Filled 2021-07-24 (×4): qty 1

## 2021-07-24 MED ORDER — BLOOD GLUCOSE METER KIT: PACK | 0 refills | Status: DC

## 2021-07-24 MED ORDER — FUROSEMIDE 20 MG PO TABS: 20.0000 mg | ORAL_TABLET | Freq: Every day | ORAL | Status: DC | PRN

## 2021-07-24 MED ORDER — IOHEXOL 350 MG/ML SOLN
75.0000 mL | Freq: Once | INTRAVENOUS | Status: AC | PRN
  Administered 2021-07-24: 75 mL via INTRAVENOUS

## 2021-07-24 MED ORDER — INSULIN DEGLUDEC 100 UNIT/ML ~~LOC~~ SOPN: 25.0000 [IU] | PEN_INJECTOR | Freq: Every day | SUBCUTANEOUS | 0 refills | Status: DC

## 2021-07-24 MED ORDER — TRAZODONE HCL 50 MG PO TABS: 25.0000 mg | ORAL_TABLET | Freq: Every evening | ORAL | Status: DC | PRN

## 2021-07-24 MED ORDER — HYDROMORPHONE HCL 1 MG/ML IJ SOLN: 0.5000 mg | INTRAMUSCULAR | Status: DC | PRN

## 2021-07-24 MED ORDER — EMPAGLIFLOZIN 25 MG PO TABS
25.0000 mg | ORAL_TABLET | Freq: Every day | ORAL | Status: DC
Start: 2021-07-25 — End: 2021-07-25
  Filled 2021-07-24: qty 1

## 2021-07-24 MED ORDER — INSULIN GLARGINE-YFGN 100 UNIT/ML ~~LOC~~ SOLN
25.0000 [IU] | Freq: Every day | SUBCUTANEOUS | Status: DC
  Administered 2021-07-24: 25 [IU] via SUBCUTANEOUS
  Filled 2021-07-24 (×2): qty 0.25

## 2021-07-24 MED ORDER — SIMVASTATIN 20 MG PO TABS: 20.0000 mg | ORAL_TABLET | Freq: Every day | ORAL | Status: DC

## 2021-07-24 MED ORDER — MUPIROCIN 2 % EX OINT
1.0000 "application " | TOPICAL_OINTMENT | Freq: Two times a day (BID) | CUTANEOUS | Status: DC
  Administered 2021-07-25 (×2): 1 via TOPICAL
  Filled 2021-07-24: qty 22

## 2021-07-24 MED ORDER — ONDANSETRON HCL 4 MG/2ML IJ SOLN: 4.0000 mg | Freq: Four times a day (QID) | INTRAMUSCULAR | Status: DC | PRN

## 2021-07-24 MED ORDER — KETOROLAC TROMETHAMINE 15 MG/ML IJ SOLN
15.0000 mg | Freq: Four times a day (QID) | INTRAMUSCULAR | Status: DC | PRN
Start: 2021-07-24 — End: 2021-07-26

## 2021-07-24 NOTE — ED Notes (Signed)
Pt complains of abd and back pain along with being more lethargic according to daughter.  ?

## 2021-07-24 NOTE — ED Triage Notes (Addendum)
Pt thought she had a uti a week ago and was put on some meds. Blood sugar was elevated on medication and this morning was 496. PCP sent pt here. Pt does still think she has a uti. Pt recently was increased on her jardiance dose as well.  ?

## 2021-07-24 NOTE — Assessment & Plan Note (Signed)
Patient currently being treated for a UTI with macrobid by PCP ?Unable to void for repeat UA ?Daughter voiced concern that patient "is not herself" ?Encouraged to seek emergent care for concern for "urosepsis" and need for IVFs and IV Abx ?

## 2021-07-24 NOTE — Telephone Encounter (Signed)
Summary: blood sugar concern  ? The patient's daughter has called to share that the patient's blood sugar was 490 when checked this morning 07/24/21  ? ?Please contact further when possible  ?  ?Called pt's daughter Emeline Gins to triage pt. Daughter advised NT that she called office and was her month was given an appointment at 2:20 with Edison Nasuti.  ? ?Attempted to call Neoma Laming back to get further info/triage. Neoma Laming asked if pt can get a shot of penicillin in the office.  ?Answer Assessment - Initial Assessment Questions ?1. BLOOD GLUCOSE: "What is your blood glucose level?"  ?    490 ?2. ONSET: "When did you check the blood glucose?" ?    This morning ?3. USUAL RANGE: "What is your glucose level usually?" (e.g., usual fasting morning value, usual evening value) ?    *No Answer* ?4. KETONES: "Do you check for ketones (urine or blood test strips)?" If yes, ask: "What does the test show now?"  ?    *No Answer* ?5. TYPE 1 or 2:  "Do you know what type of diabetes you have?"  (e.g., Type 1, Type 2, Gestational; doesn't know)  ?    Type 2 ?6. INSULIN: "Do you take insulin?" "What type of insulin(s) do you use? What is the mode of delivery? (syringe, pen; injection or pump)?"  ?    no ?7. DIABETES PILLS: "Do you take any pills for your diabetes?" If yes, ask: "Have you missed taking any pills recently?" ?    yes ?8. OTHER SYMPTOMS: "Do you have any symptoms?" (e.g., fever, frequent urination, difficulty breathing, dizziness, weakness, vomiting) ?    *No Answer* ?9. PREGNANCY: "Is there any chance you are pregnant?" "When was your last menstrual period?" ?    N/a ? ?Protocols used: Diabetes - High Blood Sugar-A-AH ? ?

## 2021-07-24 NOTE — ED Provider Notes (Signed)
? ?[facility] ?Provider Note ? ? ? Event Date/Time  ? First MD Initiated Contact with Patient 07/24/21 1834   ?  (approximate) ? ? ?History  ? ?possible uti ? ? ?HPI ? ?[name] is a 88 y.o. female patient feeling ill for about a week.  She had a UTI a week ago was put on medications but that did not help really.  She still has some abdominal pain and feels weak and tired.  Her insulin medications were increased but her sugar still high. ? ?  ? ? ?Physical Exam  ? ?Triage Vital Signs: ?ED Triage Vitals [07/24/21 1607]  ?Enc Vitals Group  ?   BP (!) 125/57  ?   Pulse Rate 91  ?   Resp 18  ?   Temp 97.7 ?F (36.5 ?C)  ?   Temp Source Oral  ?   SpO2 94 %  ?   Weight   ?   Height   ?   Head Circumference   ?   Peak Flow   ?   Pain Score 10  ?   Pain Loc   ?   Pain Edu?   ?   Excl. in [place]?   ? ? ?Most recent vital signs: ?Vitals:  ? 07/24/21 1607 07/24/21 1950  ?BP: (!) 125/57 120/64  ?Pulse: 91 84  ?Resp: 18 17  ?Temp: 97.7 ?F (36.5 ?C) 98 ?F (36.7 ?C)  ?SpO2: 94% 96%  ? ? ? ?General: Awake, no distress.  But looks tired ?CV:  Good peripheral perfusion.  Heart regular rate and rhythm no audible murmur ?Resp:  Normal effort.  Lungs are clear ?Abd:  No distention.  Abdomen is soft there is some mild diffuse tenderness with a little bit of rebound. ?Extremities with no edema ? ? ?ED Results / Procedures / Treatments  ? ?Labs ?(all labs ordered are listed, but only abnormal results are displayed) ?Labs Reviewed  ?COMPREHENSIVE METABOLIC PANEL - Abnormal; Notable for the following components:  ?    Result Value  ? Glucose, Bld 361 (*)   ? BUN 35 (*)   ? Creatinine, Ser 1.30 (*)   ? AST 172 (*)   ? ALT 281 (*)   ? Alkaline Phosphatase 294 (*)   ? GFR, Estimated 40 (*)   ? All other components within normal limits  ?CBC WITH DIFFERENTIAL/PLATELET - Abnormal; Notable for the following components:  ? WBC 10.8 (*)   ? Hemoglobin 15.1 (*)   ? HCT 46.4 (*)   ? Abs Immature Granulocytes 0.09 (*)   ? All  other components within normal limits  ?URINALYSIS, ROUTINE W REFLEX MICROSCOPIC - Abnormal; Notable for the following components:  ? Color, Urine YELLOW (*)   ? APPearance CLEAR (*)   ? Glucose, UA >=500 (*)   ? Ketones, ur 20 (*)   ? All other components within normal limits  ?LIPASE, BLOOD - Abnormal; Notable for the following components:  ? Lipase 1,032 (*)   ? All other components within normal limits  ?CBG MONITORING, ED - Abnormal; Notable for the following components:  ? Glucose-Capillary 247 (*)   ? All other components within normal limits  ?LACTIC ACID, PLASMA  ?LACTIC ACID, PLASMA  ?ACETAMINOPHEN LEVEL  ?PROTIME-INR  ?APTT  ?TROPONIN I (HIGH SENSITIVITY)  ?TROPONIN I (HIGH SENSITIVITY)  ? ? ? ?EKG ? ?EKG read and interpreted by me shows normal sinus rhythm rate of 69 normal axis no acute ST-T  changes ? ? ?RADIOLOGY ?CT read by radiology films reviewed and interpreted by me consistent with pancreatitis. ? ? ?PROCEDURES: ? ?Critical Care performed:  ? ?Procedures ? ? ?MEDICATIONS ORDERED IN ED: ?Medications  ?sodium chloride 0.9 % bolus 1,000 mL (0 mLs Intravenous Stopped 07/24/21 2058)  ?iohexol (OMNIPAQUE) 350 MG/ML injection 75 mL (75 mLs Intravenous Contrast Given 07/24/21 2020)  ?sodium chloride 0.9 % bolus 1,000 mL (1,000 mLs Intravenous New Bag/Given 07/24/21 2101)  ? ? ? ?IMPRESSION / MDM / ASSESSMENT AND PLAN / ED COURSE  ?I reviewed the triage vital signs and the nursing notes. ? ?CT shows pancreatitis.  Ultrasound did not show any evidence of gallbladder disease or stones.  It is possible she passed a stone. ? ?The patient is on the cardiac monitor to evaluate for evidence of arrhythmia and/or significant heart rate changes.  None have been seen ? ? ?FINAL CLINICAL IMPRESSION(S) / ED DIAGNOSES  ? ?Final diagnoses:  ?Acute pancreatitis, unspecified complication status, unspecified pancreatitis type  ?Elevated liver function tests  ? ? ? ?Rx / DC Orders  ? ?ED Discharge Orders   ? ? None  ? ?   ? ? ? ?Note:  This document was prepared using Dragon voice recognition software and may include unintentional dictation errors. ?  ?Nena Polio, MD ?07/24/21 2108 ? ?

## 2021-07-24 NOTE — Progress Notes (Signed)
?  ? ? ?Established patient visit ? ? ?[name]   DOB: 25-Aug-1931   88 y.o. Female  MRN: [id number] ?Visit Date: 07/24/2021 ? ?Today's healthcare provider: Gwyneth Sprout, FNP  ?Patient presents for new patient visit to establish care.  Introduced to Designer, jewellery role and practice setting.  All questions answered.  Discussed provider/patient relationship and expectations. ? ? ?Chief Complaint  ?Patient presents with  ? Hyperglycemia  ? ?[name],acting as a Education administrator for Gwyneth Sprout, FNP.,have documented all relevant documentation on the behalf of Gwyneth Sprout, FNP,as directed by  Gwyneth Sprout, FNP while in the presence of Gwyneth Sprout, FNP.  ?Subjective  ?  ?HPI  ?Follow up for Elevated Blood Sugar ? ?The patient was last seen for this 2 days ago for UTI and elevated blood sugar levels. ?Changes made at last visit include N/A . ?Daughter states that blood sugar reading was 499 this morning. ?Patient daughter reports that patient complains of abdominal pain, nausea, weakness and fatigue ?She reports excellent compliance with treatment. ?She feels that condition is Worse. ?She is not having side effects.  ? ?-----------------------------------------------------------------------------------------  ? ?Medications: ?Outpatient Medications Prior to Visit  ?Medication Sig  ? acetaminophen (TYLENOL) 325 MG tablet Take 650 mg by mouth 2 (two) times daily.  ? acetaminophen (TYLENOL) 500 MG tablet Take 2 tablets (1,000 mg total) by mouth 2 (two) times daily as needed (pain.).  ? Cholecalciferol (VITAMIN D) 2000 UNITS CAPS Take 2,000 Units by mouth daily.   ? Clobetasol Propionate 0.05 % shampoo 2-3 TIMES PER WEEK LATHER ON SCALP, LEAVE ON 20 MINUTES, WASH OUT.  ? ELIQUIS 5 MG TABS tablet TAKE 1 TABLET BY MOUTH TWICE DAILY  ? empagliflozin (JARDIANCE) 25 MG TABS tablet Take 1 tablet (25 mg total) by mouth daily before breakfast.  ? FLUOCINOLONE ACETONIDE SCALP EX Apply 0.1 % topically daily  as needed.  ? furosemide (LASIX) 20 MG tablet TAKE 1 TABLET BY MOUTH DAILY ONLY IF NEEDED.  ? glipiZIDE (GLUCOTROL XL) 5 MG 24 hr tablet TAKE ONE TABLET EACH MORNING WITH BREAKFAST  ? lisinopril (ZESTRIL) 20 MG tablet TAKE 1 TABLET BY MOUTH DAILY  ? metoprolol succinate (TOPROL-XL) 25 MG 24 hr tablet TAKE 1 TABLET BY MOUTH ONCE DAILY  ? Multiple Vitamin (MULTIVITAMIN WITH MINERALS) TABS tablet Take 1 tablet by mouth daily.  ? Multiple Vitamins-Minerals (HAIR SKIN AND NAILS FORMULA PO) Take 1 tablet by mouth daily at 6 (six) AM.  ? mupirocin ointment (BACTROBAN) 2 % Apply topically 2 (two) times daily.  ? nitrofurantoin, macrocrystal-monohydrate, (MACROBID) 100 MG capsule Take 1 capsule (100 mg total) by mouth 2 (two) times daily for 7 days.  ? simvastatin (ZOCOR) 20 MG tablet Take 1 tablet (20 mg total) by mouth at bedtime.  ? ?No facility-administered medications prior to visit.  ? ? ?Review of Systems ? ? ?  Objective  ?  ?BP 128/80   Pulse 90   Temp 97.6 ?F (36.4 ?C) (Oral)  ? ? ?Physical Exam ?Vitals and nursing note reviewed.  ?Constitutional:   ?   General: She is not in acute distress. ?   Appearance: Normal appearance. She is obese. She is not ill-appearing, toxic-appearing or diaphoretic.  ?HENT:  ?   Head: Normocephalic and atraumatic.  ?Cardiovascular:  ?   Rate and Rhythm: Normal rate and regular rhythm.  ?   Pulses: Normal pulses.  ?   Heart sounds: Normal heart sounds. No murmur  heard. ?  No friction rub. No gallop.  ?Pulmonary:  ?   Effort: Pulmonary effort is normal. No respiratory distress.  ?   Breath sounds: Normal breath sounds. No stridor. No wheezing, rhonchi or rales.  ?Chest:  ?   Chest wall: No tenderness.  ?Abdominal:  ?   General: Bowel sounds are normal.  ?   Palpations: Abdomen is soft.  ?Musculoskeletal:     ?   General: No swelling, tenderness, deformity or signs of injury. Normal range of motion.  ?   Right lower leg: No edema.  ?   Left lower leg: No edema.  ?Skin: ?   General:  Skin is warm and dry.  ?   Capillary Refill: Capillary refill takes less than 2 seconds.  ?   Coloration: Skin is not jaundiced or pale.  ?   Findings: No bruising, erythema, lesion or rash.  ?Neurological:  ?   General: No focal deficit present.  ?   Mental Status: She is alert. She is disoriented and confused.  ?   Cranial Nerves: No cranial nerve deficit.  ?   Sensory: No sensory deficit.  ?   Motor: No weakness.  ?   Coordination: Coordination normal.  ?Psychiatric:     ?   Attention and Perception: Attention normal.     ?   Mood and Affect: Mood normal.     ?   Behavior: Behavior normal.     ?   Thought Content: Thought content normal.     ?   Judgment: Judgment normal.  ?  ? ?Results for orders placed or performed during the hospital encounter of 07/24/21  ?CBC with Differential  ?Result Value Ref Range  ? WBC 10.8 (H) 4.0 - 10.5 K/uL  ? RBC 5.02 3.87 - 5.11 MIL/uL  ? Hemoglobin 15.1 (H) 12.0 - 15.0 g/dL  ? HCT 46.4 (H) 36.0 - 46.0 %  ? MCV 92.4 80.0 - 100.0 fL  ? MCH 30.1 26.0 - 34.0 pg  ? MCHC 32.5 30.0 - 36.0 g/dL  ? RDW 12.0 11.5 - 15.5 %  ? Platelets 279 150 - 400 K/uL  ? nRBC 0.0 0.0 - 0.2 %  ? Neutrophils Relative % 70 %  ? Neutro Abs 7.5 1.7 - 7.7 K/uL  ? Lymphocytes Relative 15 %  ? Lymphs Abs 1.6 0.7 - 4.0 K/uL  ? Monocytes Relative 9 %  ? Monocytes Absolute 1.0 0.1 - 1.0 K/uL  ? Eosinophils Relative 4 %  ? Eosinophils Absolute 0.5 0.0 - 0.5 K/uL  ? Basophils Relative 1 %  ? Basophils Absolute 0.1 0.0 - 0.1 K/uL  ? Immature Granulocytes 1 %  ? Abs Immature Granulocytes 0.09 (H) 0.00 - 0.07 K/uL  ?Results for orders placed or performed in visit on 07/24/21  ?POCT glucose (manual entry)  ?Result Value Ref Range  ? POC Glucose 368 (A) 70 - 99 mg/dl  ? ? Assessment & Plan  ?  ? ?Problem List Items Addressed This Visit   ? ?  ? Endocrine  ? Diabetes mellitus, type 2 ([place])  ?  Chronic, uncontrolled ?Started on long acting insulin, injection shared with patient and daughter in office today with  instructions ?Samples provided ?Glucometer ordered ?POC taken in office continues to show elevated BG; consistent with acute infection/uncontrolled DM ?Last A1c >10% ? ?  ?  ? Relevant Medications  ? insulin degludec (TRESIBA) 100 UNIT/ML FlexTouch Pen  ? blood glucose meter kit and supplies  ?  Other Relevant Orders  ? POCT glucose (manual entry) (Completed)  ?  ? Other  ? At risk for sepsis due to urinary tract infection - [place]  ?  Patient currently being treated for a UTI with macrobid by PCP ?Unable to void for repeat UA ?Daughter voiced concern that patient "is not herself" ?Encouraged to seek emergent care for concern for "urosepsis" and need for IVFs and IV Abx ? ?  ?  ? ? ?Seek emergent care today for AMS, delayed response times and confusion in setting of elevated BG and UTI. ?  ? ?I, Gwyneth Sprout, FNP, have reviewed all documentation for this visit. The documentation on 07/24/21 for the exam, diagnosis, procedures, and orders are all accurate and complete. ? ? ? ?Gwyneth Sprout, FNP  ?[place] ?[phone] (phone) ?[phone] (fax) ? ?[facility] ?

## 2021-07-24 NOTE — Assessment & Plan Note (Signed)
Chronic, uncontrolled ?Started on long acting insulin, injection shared with patient and daughter in office today with instructions ?Samples provided ?Glucometer ordered ?POC taken in office continues to show elevated BG; consistent with acute infection/uncontrolled DM ?Last A1c >10% ?

## 2021-07-25 ENCOUNTER — Other Ambulatory Visit: Payer: Self-pay

## 2021-07-25 DIAGNOSIS — R7989 Other specified abnormal findings of blood chemistry: Secondary | ICD-10-CM | POA: Diagnosis present

## 2021-07-25 DIAGNOSIS — E785 Hyperlipidemia, unspecified: Secondary | ICD-10-CM | POA: Diagnosis present

## 2021-07-25 DIAGNOSIS — N179 Acute kidney failure, unspecified: Secondary | ICD-10-CM | POA: Diagnosis present

## 2021-07-25 DIAGNOSIS — I10 Essential (primary) hypertension: Secondary | ICD-10-CM

## 2021-07-25 DIAGNOSIS — I480 Paroxysmal atrial fibrillation: Secondary | ICD-10-CM

## 2021-07-25 DIAGNOSIS — E1165 Type 2 diabetes mellitus with hyperglycemia: Secondary | ICD-10-CM

## 2021-07-25 HISTORY — DX: Other specified abnormal findings of blood chemistry: R79.89

## 2021-07-25 LAB — CBC
HCT: 38.6 % (ref 36.0–46.0)
Hemoglobin: 12.8 g/dL (ref 12.0–15.0)
MCH: 30.7 pg (ref 26.0–34.0)
MCHC: 33.2 g/dL (ref 30.0–36.0)
MCV: 92.6 fL (ref 80.0–100.0)
Platelets: 227 10*3/uL (ref 150–400)
RBC: 4.17 MIL/uL (ref 3.87–5.11)
RDW: 12.1 % (ref 11.5–15.5)
WBC: 9 10*3/uL (ref 4.0–10.5)
nRBC: 0 % (ref 0.0–0.2)

## 2021-07-25 LAB — GLUCOSE, CAPILLARY
Glucose-Capillary: 74 mg/dL (ref 70–99)
Glucose-Capillary: 131 mg/dL — ABNORMAL HIGH (ref 70–99)
Glucose-Capillary: 62 mg/dL — ABNORMAL LOW (ref 70–99)
Glucose-Capillary: 66 mg/dL — ABNORMAL LOW (ref 70–99)
Glucose-Capillary: 169 mg/dL — ABNORMAL HIGH (ref 70–99)
Glucose-Capillary: 179 mg/dL — ABNORMAL HIGH (ref 70–99)

## 2021-07-25 LAB — BASIC METABOLIC PANEL
Anion gap: 3 — ABNORMAL LOW (ref 5–15)
BUN: 26 mg/dL — ABNORMAL HIGH (ref 8–23)
CO2: 24 mmol/L (ref 22–32)
Calcium: 8.3 mg/dL — ABNORMAL LOW (ref 8.9–10.3)
Chloride: 113 mmol/L — ABNORMAL HIGH (ref 98–111)
Creatinine, Ser: 0.83 mg/dL (ref 0.44–1.00)
GFR, Estimated: >60 mL/min (ref >60)
Glucose, Bld: 86 mg/dL (ref 70–99)
Potassium: 4.1 mmol/L (ref 3.5–5.1)
Sodium: 140 mmol/L (ref 135–145)

## 2021-07-25 LAB — LIPID PANEL
Cholesterol: 123 mg/dL (ref 0–200)
HDL: 30 mg/dL — ABNORMAL LOW (ref >40)
LDL Cholesterol: 62 mg/dL (ref 0–99)
Total CHOL/HDL Ratio: 4.1 RATIO
Triglycerides: 157 mg/dL — ABNORMAL HIGH (ref <150)
VLDL: 31 mg/dL (ref 0–40)

## 2021-07-25 MED ORDER — INSULIN STARTER KIT- PEN NEEDLES (ENGLISH)
1.0000 | Freq: Once | Status: AC
  Administered 2021-07-25: 1
  Filled 2021-07-25: qty 1

## 2021-07-25 MED ORDER — LABETALOL HCL 5 MG/ML IV SOLN
20.0000 mg | INTRAVENOUS | Status: DC | PRN
Start: 2021-07-25 — End: 2021-07-26

## 2021-07-25 MED ORDER — INSULIN GLARGINE-YFGN 100 UNIT/ML ~~LOC~~ SOLN
10.0000 [IU] | Freq: Two times a day (BID) | SUBCUTANEOUS | Status: DC
  Administered 2021-07-25 – 2021-07-26 (×2): 10 [IU] via SUBCUTANEOUS
  Filled 2021-07-25 (×2): qty 0.1

## 2021-07-25 MED ORDER — INSULIN ASPART 100 UNIT/ML IJ SOLN
0.0000 [IU] | Freq: Four times a day (QID) | INTRAMUSCULAR | Status: DC
  Administered 2021-07-25: 3 [IU] via SUBCUTANEOUS
  Filled 2021-07-25: qty 1

## 2021-07-25 MED ORDER — LIVING WELL WITH DIABETES BOOK
Freq: Once | Status: AC
  Filled 2021-07-25: qty 1

## 2021-07-25 NOTE — Assessment & Plan Note (Addendum)
She has no history of alcohol abuse or evidence of cholelithiasis.  She was recently started on Macrobid at that can cause pancreatitis and therefore will be stopped.  This could be drug-induced pancreatitis. Seems improving. -Check fasting lipid panel. -Start her on clear liquid diet and advance as tolerated. -Continue with supportive care

## 2021-07-25 NOTE — Assessment & Plan Note (Signed)
-   This could be related to cholestasis and hepatitis induced by Macrobid as well. - Macrobid will be held off and LFTs will be followed with hydration.

## 2021-07-25 NOTE — [place] FL2 (Addendum)
[place] MEDICAID FL2 LEVEL OF CARE SCREENING TOOL     IDENTIFICATION  Patient Name: [name] Birthdate: October 03, 1931 Sex: female Admission Date (Current Location): 07/24/2021  [facility] and [place] Number:  Chiropodist and Address:         Provider Number: [id number]  Attending Physician Name and Address:  Arnetha Courser, MD  Relative Name and Phone Number:       Current Level of Care: Hospital Recommended Level of Care: Assisted Living Facility Prior Approval Number:    Date Approved/Denied:   PASRR Number: [id number]  Discharge Plan: Other (Comment) (ALF)    Current Diagnoses: Patient Active Problem List   Diagnosis Date Noted   Uncontrolled type 2 diabetes mellitus with hyperglycemia, with long-term current use of insulin (HCC) 07/25/2021   Dyslipidemia 07/25/2021   Essential hypertension 07/25/2021   Elevated LFTs 07/25/2021   AKI (acute kidney injury) (HCC) 07/25/2021   At risk for sepsis due to urinary tract infection 07/24/2021   Acute pancreatitis 07/24/2021   Shortness of breath 11/16/2020   Gait instability 11/16/2020   DNR (do not resuscitate) 10/19/2020   Hyperlipidemia associated with type 2 diabetes mellitus (HCC) 05/04/2020   Pressure injury of skin 12/24/2019   Abnormal echocardiogram 10/12/2019   Hip pain 12/23/2018   Lower extremity edema 12/24/2016   Generalized weakness 12/24/2016   Current use of long term anticoagulation 09/17/2016   Paroxysmal atrial fibrillation (HCC) 08/13/2016   Seizure (HCC)    Vasovagal syncope 11/21/2015   Senile purpura (HCC) 01/01/2015   Arthritis 07/12/2014   Basal cell carcinoma 07/12/2014   CC (collagenous colitis) 07/12/2014   Hypertension associated with type 2 diabetes mellitus (HCC) 07/12/2014   Obesity 07/12/2014   Acne erythematosa 07/12/2014   Diabetes mellitus, type 2 (HCC) 07/12/2014   Phlebectasia 07/12/2014   Avitaminosis D 07/12/2014   H/O malignant neoplasm of skin  10/10/2011    Orientation RESPIRATION BLADDER Height & Weight     Self, Time, Situation, Place  Normal Continent Weight: 77 kg Height:  5\' 3"  (160 cm)  BEHAVIORAL SYMPTOMS/MOOD NEUROLOGICAL BOWEL NUTRITION STATUS      Continent Diet (Full liquid.  Will Advance prior to discharge)  AMBULATORY STATUS COMMUNICATION OF NEEDS Skin   Limited Assist Verbally Normal                       Personal Care Assistance Level of Assistance              Functional Limitations Info             SPECIAL CARE FACTORS FREQUENCY                       Contractures Contractures Info: Not present    Additional Factors Info  Code Status, Allergies Code Status Info: DNR Allergies Info: Morphine sulfate, penicillin             Discharge Medications: acetaminophen 325 MG tablet Commonly known as: TYLENOL Take 650 mg by mouth 2 (two) times daily. What changed: Another medication with the same name was removed. Continue taking this medication, and follow the directions you see here.    blood glucose meter kit and supplies Kit Dispense based on patient and insurance preference. Use up to four times daily as directed. What changed: additional instructions    Clobetasol Propionate 0.05 % shampoo 2-3 TIMES PER WEEK LATHER ON SCALP, LEAVE ON 20 MINUTES, WASH OUT.  Eliquis 5 MG Tabs tablet Generic drug: apixaban TAKE 1 TABLET BY MOUTH TWICE DAILY    FLUOCINOLONE ACETONIDE SCALP EX Apply 0.1 % topically daily as needed.    furosemide 20 MG tablet Commonly known as: LASIX TAKE 1 TABLET BY MOUTH DAILY ONLY IF NEEDED.    glipiZIDE 5 MG 24 hr tablet Commonly known as: GLUCOTROL XL TAKE ONE TABLET EACH MORNING WITH BREAKFAST    HAIR SKIN AND NAILS FORMULA PO Take 1 tablet by mouth daily at 6 (six) AM.    linagliptin 5 MG Tabs tablet Commonly known as: Tradjenta Take 1 tablet (5 mg total) by mouth daily.    lisinopril 20 MG tablet Commonly known as: ZESTRIL TAKE 1  TABLET BY MOUTH DAILY    metoprolol succinate 25 MG 24 hr tablet Commonly known as: TOPROL-XL TAKE 1 TABLET BY MOUTH ONCE DAILY    multivitamin with minerals Tabs tablet Take 1 tablet by mouth daily.    mupirocin ointment 2 % Commonly known as: BACTROBAN Apply topically 2 (two) times daily.    simvastatin 20 MG tablet Commonly known as: ZOCOR Take 1 tablet (20 mg total) by mouth at bedtime. Hold for 1 week What changed: additional instructions    Vitamin D 50 MCG (2000 UT) Caps Take 2,000 Units by mouth daily.      Relevant Imaging Results:  Relevant Lab Results:   Additional Information SSN:[id number]  Karren Sessions, RN

## 2021-07-25 NOTE — Progress Notes (Signed)
Progress Note   [name] ASN:[id number] DOB: 04-06-1931 DOA: 07/24/2021     1 DOS: the patient was seen and examined on 07/25/2021   Brief hospital course: Taken from H&P.  PAYETON GERMANI is a 88 y.o. Caucasian female with medical history significant for type 2 diabetes mellitus, hypertension and paroxysmal atrial fibrillation, who presented to the emergency room with acute onset of generalized weakness and fatigue with abdominal pain mainly in the lower abdominal area with epigastric and left upper quadrant tenderness without nausea or vomiting or diarrhea.  No dysuria, oliguria or hematuria or flank pain.  No cough or wheezing or dyspnea.  No fever or chills.  The patient was having a recent UTI for which she was started on Macrobid of which she has only 1 to 2 days left from 5 days of therapy.    ED Course: Upon presentation to the emergency room, vital signs were within normal.  Labs revealed elevated lipase of 1032.  High-sensitivity troponin I was 12 and lactic acid 1.6.  INR was 1.5 with PT of 18.3.  Tylenol level was less than 10 and PTT was 36.  CMP was remarkable for creatinine 1.3 up from 1.18 days ago, alkaline phosphatase of 294 with AST 172 ALT of 281 and BUN was 35 with blood glucose of 361.  CBC showed mild leukocytosis of 10.8.  UA showed more than 500 glucose with 20 ketones and was otherwise unremarkable. EKG as reviewed by me : EKG showed normal sinus rhythm with a rate of 96 Imaging: Two-view chest x-ray showed stable elevation of the left hemidiaphragm with no acute cardiopulmonary disease. CT abdomen and pelvis was done which shows no CT evidence of mesenteric ischemia but shows findings concerning for acute pancreatitis.  There was also noted colonic diverticulosis but no diverticulitis.  Fatty liver and aortic atherosclerosis was noted.  The patient was given 2 L bolus of IV normal saline.  TRH was consulted for admission for further evaluation and  management.  5/18: Patient seems improving, nausea and vomiting has been improved.  Abdominal pain improving, lipase decreased to 288.  Patient was started on clear liquid diet and we will advance as tolerated.     Assessment and Plan: * Acute pancreatitis She has no history of alcohol abuse or evidence of cholelithiasis.  She was recently started on Macrobid at that can cause pancreatitis and therefore will be stopped.  This could be drug-induced pancreatitis. Seems improving. -Check fasting lipid panel. -Start her on clear liquid diet and advance as tolerated. -Continue with supportive care  Uncontrolled type 2 diabetes mellitus with hyperglycemia, with long-term current use of insulin ([place]) Jardiance was discontinued for concern of recent UTI. Had mild hypoglycemia this morning. -Changed Semglee to 10 units twice daily. -Continue with SSI  Elevated LFTs - This could be related to cholestasis and hepatitis induced by Macrobid as well. - Macrobid will be held off and LFTs will be followed with hydration.  AKI (acute kidney injury) ([place]) Resolved.  Creatinine is normal at baseline. -Monitor renal function -Avoid nephrotoxins  Dyslipidemia - We we will check fasting lipids and hold off statin therapy for now given significantly elevated LFTs.  Essential hypertension Blood pressure within goal. Home dose of lisinopril was initially held for AKI-we can resume if needed. -Continue with Toprol  Paroxysmal atrial fibrillation (HCC) Currently in sinus rhythm. -Continue home metoprolol and Eliquis     Subjective: Patient was feeling much improved, no more nausea or vomiting.  Abdominal pain improving.  She was asking about food.  Physical Exam: Vitals:   07/24/21 1950 07/24/21 2243 07/25/21 0308 07/25/21 0734  BP: 120/64 (!) 151/65 120/60 (!) 121/52  Pulse: 84 76 73 75  Resp: '17 20 16 16  '$ Temp: 98 F (36.7 C) 97.7 F (36.5 C) 98.4 F (36.9 C) 98 F (36.7 C)  TempSrc:  Oral Oral Oral Oral  SpO2: 96% 97% 97% 96%  Weight:  77 kg    Height:  '5\' 3"'$  (1.6 m)     General.  Well-developed elderly lady, in no acute distress. Pulmonary.  Lungs clear bilaterally, normal respiratory effort. CV.  Regular rate and rhythm, no JVD, rub or murmur. Abdomen.  Soft, nontender, nondistended, BS positive. CNS.  Alert and oriented .  No focal neurologic deficit. Extremities.  No edema, no cyanosis, pulses intact and symmetrical. Psychiatry.  Judgment and insight appears normal.  Data Reviewed: Prior notes, labs and images reviewed  Family Communication:   Disposition: Status is: Inpatient Remains inpatient appropriate because: Severity of illness   Planned Discharge Destination: Home  DVT prophylaxis.  Eliquis Time spent: 45 minutes   This record has been created using Systems analyst. Errors have been sought and corrected,but may not always be located. Such creation errors do not reflect on the standard of care.  Author: Lorella Nimrod, MD 07/25/2021 2:38 PM  For on call review [email or web address].

## 2021-07-25 NOTE — H&P (Signed)
[place]   PATIENT NAME: [name]    MR#:  [id number]  DATE OF BIRTH:  03/25/1931  DATE OF ADMISSION:  07/24/2021  PRIMARY CARE PHYSICIAN: Virginia Crews, MD   Patient is coming from: Home  REQUESTING/REFERRING PHYSICIAN: Conni Slipper, MD  CHIEF COMPLAINT:   Chief Complaint  Patient presents with   possible uti    HISTORY OF PRESENT ILLNESS:  [name] LIZARDI is a 88 y.o. Caucasian female with medical history significant for type 2 diabetes mellitus, hypertension and paroxysmal atrial fibrillation, who presented to the emergency room with acute onset of generalized weakness and fatigue with abdominal pain mainly in the lower abdominal area with epigastric and left upper quadrant tenderness without nausea or vomiting or diarrhea.  No dysuria, oliguria or hematuria or flank pain.  No cough or wheezing or dyspnea.  No fever or chills.  The patient was having a recent UTI for which she was started on Macrobid of which she has only 1 to 2 days left from 5 days of therapy.    ED Course: Upon presentation to the emergency room, vital signs were within normal.  Labs revealed elevated lipase of 1032.  High-sensitivity troponin I was 12 and lactic acid 1.6.  INR was 1.5 with PT of 18.3.  Tylenol level was less than 10 and PTT was 36.  CMP was remarkable for creatinine 1.3 up from 1.18 days ago, alkaline phosphatase of 294 with AST 172 ALT of 281 and BUN was 35 with blood glucose of 361.  CBC showed mild leukocytosis of 10.8.  UA showed more than 500 glucose with 20 ketones and was otherwise unremarkable. EKG as reviewed by me : EKG showed normal sinus rhythm with a rate of 96 Imaging: Two-view chest x-ray showed stable elevation of the left hemidiaphragm with no acute cardiopulmonary disease.  The patient was given 2 L bolus of IV normal saline.  She will be admitted to a medical telemetry bed for further evaluation and management. PAST MEDICAL HISTORY:   Past Medical  History:  Diagnosis Date   Basal cell carcinoma 03/30/2008   left upper arm/excision   Basal cell carcinoma 03/31/2008   left upper arm, right lower leg   Basal cell carcinoma 01/18/2009   left upper arm   Chronic diarrhea    Collagenous colitis    Diabetes mellitus without complication ([place])    Diverticulitis    Heart murmur    at birth, none since   History of echocardiogram    a. 11/2015: echo showing EF of 60-65% with no WMA. Grade 1 DD and mild MR noted.    Hypertension    Intertrochanteric fracture of right femur ([place]) 06/11/2019   PAF (paroxysmal atrial fibrillation) ([place])    a. initially diagnosed in 08/2016 --> started on Eliquis   Squamous cell carcinoma of skin 05/16/2020   Left nasal tip anterior to ala, refer to [facility]   Syncope    a. initially occurring in Fall 2016 b. Hospitalized in 10/2015 and 11/2015 for recurrent episodes.     PAST SURGICAL HISTORY:   Past Surgical History:  Procedure Laterality Date   ABDOMINAL HYSTERECTOMY     BACK SURGERY  08/08/2008   Lumbar discectomy   COLONOSCOPY WITH PROPOFOL     EXCISION/RELEASE BURSA HIP Left 05/03/2019   Procedure: HIP ABDUCTOR REPAIR;  Surgeon: Hessie Knows, MD;  Location: [facility] ORS;  Service: Orthopedics;  Laterality: Left;   INTRAMEDULLARY (IM) NAIL INTERTROCHANTERIC Right  06/13/2019   Procedure: INTRAMEDULLARY (IM) NAIL INTERTROCHANTRIC;  Surgeon: Hessie Knows, MD;  Location: [facility] ORS;  Service: Orthopedics;  Laterality: Right;   NECK SURGERY  1980    SOCIAL HISTORY:   Social History   Tobacco Use   Smoking status: Former    Types: Cigarettes    Quit date: 03/09/1978    Years since quitting: 43.4   Smokeless tobacco: Never  Substance Use Topics   Alcohol use: Yes    Alcohol/week: 0.0 - 1.0 standard drinks    Comment: once a month    FAMILY HISTORY:   Family History  Problem Relation Age of Onset   Hypertension Mother    Diabetes Mother    Lung cancer Father     DRUG ALLERGIES:   Allergies   Allergen Reactions   Morphine Sulfate Other (See Comments)    BP bottoms out   Penicillins Diarrhea    Has patient had a PCN reaction causing immediate rash, facial/tongue/throat swelling, SOB or lightheadedness with hypotension: Yes Has patient had a PCN reaction causing severe rash involving mucus membranes or skin necrosis: Yes Has patient had a PCN reaction that required hospitalization No Has patient had a PCN reaction occurring within the last 10 years: No If all of the above answers are "NO", then may proceed with Cephalosporin use.    REVIEW OF SYSTEMS:   ROS As per history of present illness. All pertinent systems were reviewed above. Constitutional, HEENT, cardiovascular, respiratory, GI, GU, musculoskeletal, neuro, psychiatric, endocrine, integumentary and hematologic systems were reviewed and are otherwise negative/unremarkable except for positive findings mentioned above in the HPI.   MEDICATIONS AT HOME:   Prior to Admission medications   Medication Sig Start Date End Date Taking? Authorizing Provider  acetaminophen (TYLENOL) 325 MG tablet Take 650 mg by mouth 2 (two) times daily.    [provider]  acetaminophen (TYLENOL) 500 MG tablet Take 2 tablets (1,000 mg total) by mouth 2 (two) times daily as needed (pain.). 12/27/19   Enzo Bi, MD  blood glucose meter kit and supplies Dispense based on patient and insurance preference. Use up to four times daily as directed. (FOR ICD-10 E10.9, E11.9). 07/24/21   Gwyneth Sprout, FNP  Cholecalciferol (VITAMIN D) 2000 UNITS CAPS Take 2,000 Units by mouth daily.     [provider]  Clobetasol Propionate 0.05 % shampoo 2-3 TIMES PER WEEK LATHER ON SCALP, LEAVE ON 20 MINUTES, [place] OUT. 11/27/20   Brendolyn Patty, MD  ELIQUIS 5 MG TABS tablet TAKE 1 TABLET BY MOUTH TWICE DAILY 07/03/21   End, Harrell Gave, MD  empagliflozin (JARDIANCE) 25 MG TABS tablet Take 1 tablet (25 mg total) by mouth daily before breakfast. 07/22/21    Jerrol Banana., MD  FLUOCINOLONE ACETONIDE SCALP EX Apply 0.1 % topically daily as needed.    [provider]  furosemide (LASIX) 20 MG tablet TAKE 1 TABLET BY MOUTH DAILY ONLY IF NEEDED. 03/20/21   Bacigalupo, Dionne Bucy, MD  glipiZIDE (GLUCOTROL XL) 5 MG 24 hr tablet TAKE ONE TABLET EACH MORNING WITH BREAKFAST 11/18/20   Bacigalupo, Dionne Bucy, MD  insulin degludec (TRESIBA) 100 UNIT/ML FlexTouch Pen Inject 25 Units into the skin at bedtime. Titrate to fasting blood sugar goal of 110-130 every morning. 07/24/21   Gwyneth Sprout, FNP  lisinopril (ZESTRIL) 20 MG tablet TAKE 1 TABLET BY MOUTH DAILY 07/15/21   End, Harrell Gave, MD  metoprolol succinate (TOPROL-XL) 25 MG 24 hr tablet TAKE 1 TABLET BY MOUTH  ONCE DAILY 02/06/21   End, Harrell Gave, MD  Multiple Vitamin (MULTIVITAMIN WITH MINERALS) TABS tablet Take 1 tablet by mouth daily.    [provider]  Multiple Vitamins-Minerals (HAIR SKIN AND NAILS FORMULA PO) Take 1 tablet by mouth daily at 6 (six) AM.    [provider]  mupirocin ointment (BACTROBAN) 2 % Apply topically 2 (two) times daily. 04/19/20   [provider]  nitrofurantoin, macrocrystal-monohydrate, (MACROBID) 100 MG capsule Take 1 capsule (100 mg total) by mouth 2 (two) times daily for 7 days. 07/18/21 07/25/21  Virginia Crews, MD  simvastatin (ZOCOR) 20 MG tablet Take 1 tablet (20 mg total) by mouth at bedtime. 05/08/21   Bacigalupo, Dionne Bucy, MD      VITAL SIGNS:  Blood pressure 120/60, pulse 73, temperature 98.4 F (36.9 C), temperature source Oral, resp. rate 16, height $RemoveBe'5\' 3"'dorHjjzUc$  (1.6 m), weight 77 kg, SpO2 97 %.  PHYSICAL EXAMINATION:  Physical Exam  GENERAL:  88 y.o.-year-old Caucasian female patient lying in the bed with no acute distress.  EYES: Pupils equal, round, reactive to light and accommodation. No scleral icterus. Extraocular muscles intact.  HEENT: Head atraumatic, normocephalic. Oropharynx and nasopharynx clear.  NECK:  Supple,  no jugular venous distention. No thyroid enlargement, no tenderness.  LUNGS: Normal breath sounds bilaterally, no wheezing, rales,rhonchi or crepitation. No use of accessory muscles of respiration.  CARDIOVASCULAR: Regular rate and rhythm, S1, S2 normal. No murmurs, rubs, or gallops.  ABDOMEN: Soft, nondistended, with epigastric and left upper quadrant tenderness without rebound tenderness guarding or rigidity.  Bowel sounds present. No organomegaly or mass.  EXTREMITIES: No pedal edema, cyanosis, or clubbing.  NEUROLOGIC: Cranial nerves II through XII are intact. Muscle strength 5/5 in all extremities. Sensation intact. Gait not checked.  PSYCHIATRIC: The patient is alert and oriented x 3.  Normal affect and good eye contact. SKIN: No obvious rash, lesion, or ulcer.   LABORATORY PANEL:   CBC Recent Labs  Lab 07/24/21 1610  WBC 10.8*  HGB 15.1*  HCT 46.4*  PLT 279   ------------------------------------------------------------------------------------------------------------------  Chemistries  Recent Labs  Lab 07/24/21 1610  NA 135  K 5.0  CL 99  CO2 22  GLUCOSE 361*  BUN 35*  CREATININE 1.30*  CALCIUM 9.4  AST 172*  ALT 281*  ALKPHOS 294*  BILITOT 0.8   ------------------------------------------------------------------------------------------------------------------  Cardiac Enzymes No results for input(s): TROPONINI in the last 168 hours. ------------------------------------------------------------------------------------------------------------------  RADIOLOGY:  DG Chest 2 View  Result Date: 07/24/2021 CLINICAL DATA:  Chest pain.  Hypertension.  Hyperglycemia. EXAM: CHEST - 2 VIEW COMPARISON:  12/14/2019 FINDINGS: The heart size and mediastinal contours are within normal limits. Aortic atherosclerotic calcification incidentally noted. Elevation of left hemidiaphragm is again noted. Both lungs are clear. The visualized skeletal structures are unremarkable.  IMPRESSION: Stable elevation of left hemidiaphragm. No active cardiopulmonary disease. Electronically Signed   By: Marlaine Hind M.D.   On: 07/24/2021 16:35   CT Angio Abd/Pel W and/or Wo Contrast  Result Date: 07/24/2021 CLINICAL DATA:  Abdominal pain.  Concern for mesenteric ischemia. EXAM: CTA ABDOMEN AND PELVIS WITHOUT AND WITH CONTRAST TECHNIQUE: Multidetector CT imaging of the abdomen and pelvis was performed using the standard protocol during bolus administration of intravenous contrast. Multiplanar reconstructed images and MIPs were obtained and reviewed to evaluate the vascular anatomy. RADIATION DOSE REDUCTION: This exam was performed according to the departmental dose-optimization program which includes automated exposure control, adjustment of the mA and/or kV according to patient size and/or  use of iterative reconstruction technique. CONTRAST:  35mL OMNIPAQUE IOHEXOL 350 MG/ML SOLN COMPARISON:  Abdominal ultrasound dated 07/24/2021 and CT dated 12/23/2019. FINDINGS: VASCULAR Aorta: Moderate atherosclerotic calcification of the abdominal aorta. No aneurysmal dilatation or dissection. No periaortic fluid collection. Celiac: Atherosclerotic calcification of the origin of the celiac axis. The celiac artery and its major branches remain patent. SMA: Patent without evidence of aneurysm, dissection, vasculitis or significant stenosis. Renals: Both renal arteries are patent without evidence of aneurysm, dissection, vasculitis, fibromuscular dysplasia or significant stenosis. IMA: Patent without evidence of aneurysm, dissection, vasculitis or significant stenosis. Inflow: Patent without evidence of aneurysm, dissection, vasculitis or significant stenosis. Proximal Outflow: Bilateral common femoral and visualized portions of the superficial and profunda femoral arteries are patent without evidence of aneurysm, dissection, vasculitis or significant stenosis. Veins: The IVC is unremarkable. The SMV, splenic  vein, and main portal vein are patent. No portal venous gas. Review of the MIP images confirms the above findings. NON-VASCULAR Lower chest: Mild eventration of the left hemidiaphragm. There is minimal bibasilar atelectasis. Coronary vascular calcification. No intra-abdominal free air or free fluid. Hepatobiliary: Fatty liver. No intrahepatic biliary ductal dilatation. The gallbladder is unremarkable. Pancreas: Mild haziness of the peripancreatic fat concerning for acute pancreatitis. Correlation with pancreatic enzymes recommended. No drainable fluid collection/abscess or pseudocyst. Spleen: Normal in size without focal abnormality. Adrenals/Urinary Tract: The adrenal glands are unremarkable. The right kidney is located in the lower abdomen. There is no hydronephrosis on either side. There is a 15 mm left renal inferior pole cyst and additional subcentimeter hypodense lesions which are not characterized. The visualized ureters and urinary bladder appear unremarkable. Stomach/Bowel: There is sigmoid diverticulosis and scattered colonic diverticula. No active inflammatory changes. There is no bowel obstruction or active inflammation. The appendix is normal. Lymphatic: No adenopathy. Reproductive: Hysterectomy.  No adnexal masses. Other: Small fat containing umbilical hernia.  No fluid collection. Musculoskeletal: Osteopenia with degenerative changes of the spine. Status post prior ORIF of right femoral neck. No acute osseous pathology. IMPRESSION: 1. No CT evidence of mesenteric ischemia. 2. Findings concerning for acute pancreatitis. Correlation with pancreatic enzymes recommended. 3. Colonic diverticulosis.  No bowel obstruction.  Normal appendix. 4. Fatty liver. 5. Aortic Atherosclerosis (ICD10-I70.0). Electronically Signed   By: Anner Crete M.D.   On: 07/24/2021 20:55   US Abdomen Limited RUQ (LIVER/GB)  Result Date: 07/24/2021 CLINICAL DATA:  Right upper quadrant pain EXAM: ULTRASOUND ABDOMEN LIMITED  RIGHT UPPER QUADRANT COMPARISON:  12/23/2019 FINDINGS: Gallbladder: No gallstones or wall thickening visualized. No sonographic Murphy sign noted by sonographer. Common bile duct: Diameter: 4 mm Liver: Diffuse increased liver echotexture consistent with hepatic steatosis. No focal liver abnormality. No biliary duct dilation. Portal vein is patent on color Doppler imaging with normal direction of blood flow towards the liver. Other: None. IMPRESSION: 1. No evidence of cholelithiasis or cholecystitis. 2. Hepatic steatosis. Electronically Signed   By: Randa Ngo M.D.   On: 07/24/2021 19:50      IMPRESSION AND PLAN:  Assessment and Plan: * Acute pancreatitis - The patient will be admitted to a medical bed. - We will keep her n.p.o. except for medications. - We will follow serial lipase levels. - Pain management will be provided. - We will check fasting lipids. - She has no history of alcohol abuse or evidence of cholelithiasis. - She was recently started on Macrobid at that can cause pancreatitis and therefore will be stopped.  This could be drug-induced pancreatitis.  Uncontrolled type 2 diabetes mellitus  with hyperglycemia, with long-term current use of insulin ([place]) - The patient will be placed on supplement coverage with resistant NovoLog protocol with FSBG 4 times daily. - We will continue her on Jardiance and while n.p.o. we will hold off Glucotrol XL.  It can be later resumed if she continues to be hyperglycemic though.  Elevated LFTs - This could be related to cholestasis and hepatitis induced by Macrobid as well. - Macrobid will be held off and LFTs will be followed with hydration.  AKI (acute kidney injury) ([place]) - The patient will be hydrated with IV normal saline. - We will hold off nephrotoxins. - We will follow BMP.  Dyslipidemia - We we will check fasting lipids and hold off statin therapy for now given significantly elevated LFTs.  Essential hypertension - We will hold  off her lisinopril given acute kidney injury. - We will continue Toprol-XL   DVT prophylaxis: Lovenox.  Advanced Care Planning:  Code Status: She is DNR/DNI.  This was confirmed with her.  Family Communication:  The plan of care was discussed in details with the patient (and family). I answered all questions. The patient agreed to proceed with the above mentioned plan. Further management will depend upon hospital course. Disposition Plan: Back to previous home environment Consults called: none.  All the records are reviewed and case discussed with ED provider.  Status is: Inpatient  At the time of the admission, it appears that the appropriate admission status for this patient is inpatient.  This is judged to be reasonable and necessary in order to provide the required intensity of service to ensure the patient's safety given the presenting symptoms, physical exam findings and initial radiographic and laboratory data in the context of comorbid conditions.  The patient requires inpatient status due to high intensity of service, high risk of further deterioration and high frequency of surveillance required.  I certify that at the time of admission, it is my clinical judgment that the patient will require inpatient hospital care extending more than 2 midnights.                            Dispo: The patient is from: Home              Anticipated d/c is to: Home              Patient currently is not medically stable to d/c.              Difficult to place patient: No  Christel Mormon M.D on 07/25/2021 at 3:50 AM  [facility]   From 7 PM-7 AM, contact night-coverage [email or web address]  CC: Primary care physician; Virginia Crews, MD

## 2021-07-25 NOTE — Assessment & Plan Note (Addendum)
Resolved.  Creatinine is normal at baseline. -Monitor renal function -Avoid nephrotoxins

## 2021-07-25 NOTE — Progress Notes (Signed)
[name] Informed from nursing concerned with order for continuous IV fluids of normal saline at 100 ml/h that she has not been receiving for several days. Order discontinued as she is eating and drinking well, renal function good and no other indications for need that I see

## 2021-07-25 NOTE — TOC Initial Note (Signed)
Transition of Care [facility]) - Initial/Assessment Note    Patient Details  Name: [name] MRN: [id number] Date of Birth: 02/24/1932  [facility] CM/SW Contact:    Timiya Sessions, RN Phone Number: 07/25/2021, 2:34 PM  Clinical Narrative:                  Admitted for: Pancreatitis  Admitted from: [facility].  Message sent to Seth Bake at [facility] to confirm PCP: Brita Romp Current home health/prior home health/DME: [facility]  Anticipated DC tomorrow.  Daughter at bedside. Per daughter if she is discharged tomorrow before 1 Pm she will be able to transport patient back to [place].  If it is after [place] will need to provide transportation.    Started Fl2,  will need to be updated with DC medications at discharge   Expected Discharge Plan: Assisted Living Barriers to Discharge: Continued Medical Work up   Patient Goals and CMS Choice        Expected Discharge Plan and Services Expected Discharge Plan: Assisted Living       Living arrangements for the past 2 months: Assisted Living Facility                                      Prior Living Arrangements/Services Living arrangements for the past 2 months: [place] Lives with:: Facility Resident Patient language and need for interpreter reviewed:: Yes Do you feel safe going back to the place where you live?: Yes      Need for Family Participation in Patient Care: Yes (Comment) Care giver support system in place?: Yes (comment) Current home services: DME Criminal Activity/Legal Involvement Pertinent to Current Situation/Hospitalization: No - Comment as needed  Activities of Daily Living Home Assistive Devices/Equipment: Walker (specify type) ([place]) ADL Screening (condition at time of admission) Patient's cognitive ability adequate to safely complete daily activities?: Yes Is the patient deaf or have difficulty hearing?: No Does the patient have difficulty  seeing, even when wearing glasses/contacts?: No Does the patient have difficulty concentrating, remembering, or making decisions?: No Patient able to express need for assistance with ADLs?: Yes Does the patient have difficulty dressing or bathing?: No Independently performs ADLs?: Yes (appropriate for developmental age) Does the patient have difficulty walking or climbing stairs?: Yes Weakness of Legs: Both Weakness of Arms/Hands: Both  Permission Sought/Granted                  Emotional Assessment       Orientation: : Oriented to Self, Oriented to Place, Oriented to Situation, Oriented to  Time Alcohol / Substance Use: Not Applicable Psych Involvement: No (comment)  Admission diagnosis:  Acute pancreatitis [K85.90] Elevated liver function tests [R79.89] Acute pancreatitis, unspecified complication status, unspecified pancreatitis type [K85.90] Patient Active Problem List   Diagnosis Date Noted   Uncontrolled type 2 diabetes mellitus with hyperglycemia, with long-term current use of insulin ([place]) 07/25/2021   Dyslipidemia 07/25/2021   Essential hypertension 07/25/2021   Elevated LFTs 07/25/2021   AKI (acute kidney injury) ([place]) 07/25/2021   At risk for sepsis due to urinary tract infection 07/24/2021   Acute pancreatitis 07/24/2021   Shortness of breath 11/16/2020   Gait instability 11/16/2020   DNR (do not resuscitate) 10/19/2020   Hyperlipidemia associated with type 2 diabetes mellitus ([place]) 05/04/2020   Pressure injury of skin 12/24/2019   Abnormal echocardiogram 10/12/2019  Hip pain 12/23/2018   Lower extremity edema 12/24/2016   Generalized weakness 12/24/2016   Current use of long term anticoagulation 09/17/2016   Paroxysmal atrial fibrillation ([place]) 08/13/2016   Seizure ([place])    Vasovagal syncope 11/21/2015   Senile purpura ([place]) 01/01/2015   Arthritis 07/12/2014   Basal cell carcinoma 07/12/2014   CC (collagenous colitis) 07/12/2014   Hypertension  associated with type 2 diabetes mellitus ([place]) 07/12/2014   Obesity 07/12/2014   Acne erythematosa 07/12/2014   Diabetes mellitus, type 2 ([place]) 07/12/2014   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

## 2021-07-25 NOTE — Assessment & Plan Note (Addendum)
-   We we will check fasting lipids and hold off statin therapy for now given significantly elevated LFTs.

## 2021-07-25 NOTE — Assessment & Plan Note (Signed)
Currently in sinus rhythm. -Continue home metoprolol and Eliquis

## 2021-07-25 NOTE — Assessment & Plan Note (Addendum)
Blood pressure within goal. Home dose of lisinopril was initially held for AKI-we can resume if needed. -Continue with Toprol

## 2021-07-25 NOTE — Assessment & Plan Note (Addendum)
Jardiance was discontinued for concern of recent UTI. Had mild hypoglycemia this morning. -Changed Semglee to 10 units twice daily. -Continue with SSI

## 2021-07-25 NOTE — Hospital Course (Addendum)
Taken from H&P.  [name] is a 88 y.o. Caucasian female with medical history significant for type 2 diabetes mellitus, hypertension and paroxysmal atrial fibrillation, who presented to the emergency room with acute onset of generalized weakness and fatigue with abdominal pain mainly in the lower abdominal area with epigastric and left upper quadrant tenderness without nausea or vomiting or diarrhea.  No dysuria, oliguria or hematuria or flank pain.  No cough or wheezing or dyspnea.  No fever or chills.  The patient was having a recent UTI for which she was started on Macrobid of which she has only 1 to 2 days left from 5 days of therapy.    ED Course: Upon presentation to the emergency room, vital signs were within normal.  Labs revealed elevated lipase of 1032.  High-sensitivity troponin I was 12 and lactic acid 1.6.  INR was 1.5 with PT of 18.3.  Tylenol level was less than 10 and PTT was 36.  CMP was remarkable for creatinine 1.3 up from 1.18 days ago, alkaline phosphatase of 294 with AST 172 ALT of 281 and BUN was 35 with blood glucose of 361.  CBC showed mild leukocytosis of 10.8.  UA showed more than 500 glucose with 20 ketones and was otherwise unremarkable. EKG as reviewed by me : EKG showed normal sinus rhythm with a rate of 96 Imaging: Two-view chest x-ray showed stable elevation of the left hemidiaphragm with no acute cardiopulmonary disease. CT abdomen and pelvis was done which shows no CT evidence of mesenteric ischemia but shows findings concerning for acute pancreatitis.  There was also noted colonic diverticulosis but no diverticulitis.  Fatty liver and aortic atherosclerosis was noted.  The patient was given 2 L bolus of IV normal saline.  TRH was consulted for admission for further evaluation and management.  5/18: Patient seems improving, nausea and vomiting has been improved.  Abdominal pain improving, lipase decreased to 288.  Patient was started on clear liquid diet and we  will advance as tolerated.  5/19: Patient continued to improve.  Eating and drinking without any difficulty.  Liver enzymes are improving, significant improvement in AST and ALT but alkaline phosphatase seems stable.  Patient should be holding her statin for another week and was instructed to have her levels checked by PCP next week before resuming it. She was also told not to use rest of her Macrobid as they can cause pancreatitis. Lipid profile with normal total cholesterol, triglyceride of 157, HDL of 30 and LDL of 62. Her home dose of Jardiance was also discontinued due to concern of UTI. She was started on [place] was discontinued as she does not want to take any insulin.  We tried teaching her how to inject herself but she was having a lot of difficulty.  Her assisted living will not accept her if she cannot give herself an injection and she will required higher level of care which she does not want at this time.  Her PCP should be able to monitor her blood glucose level and make appropriate changes.  She will continue with rest of her home medications and will follow-up with her providers.

## 2021-07-25 NOTE — Progress Notes (Addendum)
Inpatient Diabetes Program Recommendations  AACE/ADA: New Consensus Statement on Inpatient Glycemic Control   Target Ranges:  Prepandial:   less than 140 mg/dL      Peak postprandial:   less than 180 mg/dL (1-2 hours)      Critically ill patients:  140 - 180 mg/dL    Latest Reference Range & Units 07/24/21 21:05 07/24/21 23:00 07/25/21 07:32  Glucose-Capillary 70 - 99 mg/dL 247 (H) 191 (H) 74    Latest Reference Range & Units 07/24/21 16:10  Glucose 70 - 99 mg/dL 361 (H)    Latest Reference Range & Units 07/24/21 19:21 07/25/21 04:33  Lipase 11 - 51 U/L 1,032 (H) 288 (H)    Latest Reference Range & Units 07/24/21 16:10  AST 15 - 41 U/L 172 (H)  ALT 0 - 44 U/L 281 (H)    Latest Reference Range & Units 10/11/20 00:00 04/29/21 13:00 07/16/21 00:00  Hemoglobin A1C 4.8 - 5.6 % 7.5 (E) 11.2 (H) 10.9 (E)  (H): Data is abnormally high (E): External lab result  Review of Glycemic Control  Diabetes history: DM2 Outpatient Diabetes medications: Jardiance 25 mg QAM, Glipizide XL 5 mg QAM, Tresiba 25 units QHS (just prescribed at PCP office on 07/24/21) Current orders for Inpatient glycemic control: Jardiance 25 mg QAM, Semglee 25 units QHS, Novolog 0-20 units QID  Inpatient Diabetes Program Recommendations:    DM medications: Please discontinue Jardiance as inpatient and outpatient (patient with recent UTI).   Discharge needs: At time of discharge, please provide Rx for Glucose monitoring kit (#[id number]).  NOTE: Patient admitted with acute pancreatitis (possibly from [facility] - for UTI), hyperglycemia, elevated LFTs, AKI, and dyslipidemia. In reviewing chart, noted patient seen PCP on 04/29/21 and A1C was 11.2% (prior A1C was 7.5% on 10/11/20). Per telephone note on 05/02/21, patient was started on Jardiance 10 mg daily. Patient seen Tally Joe, FNP yesterday and was prescribed insulin "Tresiba 25 Units into the skin at bedtime. Titrate to fasting blood sugar goal of 110-130 every  morning."  Jessi, RN asked patient if she received Tresiba in the office yesterday but patient is not sure if she did or not. Called patient's daughter Neoma Laming) over the phone and she reports that patient was given Tyler Aas 25 units in the office yesterday.  Neoma Laming confirms that patient was recently started on Jardiance in February.  Discussed risk of UTI with Jardiance and that it was being recommended that Jardiance be discontinued as inpatient and outpatient. Neoma Laming states that patient lives at [facility] and if she is going to be on insulin she will need to give herself her shots in order to stay where she is currently. She asked if there are other oral DM options for her mother as she is not sure if her mother can remember to take a daily injection of insulin (reports her short term memory is not good). I told her I was planning to see patient today and I would review the insulin with her and see if she is able to learn how to self administer insulin or not. Patient was given Semglee 25 units last night and fasting glucose is 74 mg/dl today. Concerned about hypoglycemia due to patient getting Tresiba 25 units at the PCP office and also getting Semglee 25 units last night. Have communicated via secure chat with Clyde Canterbury, RN and Dr. Reesa Chew. Will plan to see patient today.  Addendum 07/25/21_0 :00-Spoke with patient at bedside regarding DM management. Patient reports that she lives at [facility] but she manages her own medications. Patient does not check glucose and does not have a glucometer. 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 Patient does not recall normal glucose or A1C values and she is unsure what her prior A1C was or what the name of her DM medications are. Discussed that she is prescribed Jardiance, Glipizide, and prescribed Tresiba insulin yesterday. Discussed risk of UTI with Jardiance and recommendation that Jardiance be stopped. Patient  initially stated that she did not want to use insulin as an outpatient and prefers to use oral DM medications. Patient notes that she could make some dietary changes (drinks sweet tea and eats desserts sometimes) to help get DM under better control. Patient's daughter Neoma Laming came in room during visit and she informed her mother that she had talked with someone at [facility] and they have someone that can assist with insulin injection Monday-Friday and someone that could help guide her with insulin injection on Saturday and Sunday. Neoma Laming told patient that she would like her to take insulin and that she will not have to move from [facility] if she takes insulin. Patient states that she will attempt to take insulin herself as long as she does not have to move out of [facility]. Discussed Tresiba insulin, storage of insulin, injection sites, and rotation of injection sites. Educated patient and daughter on insulin pen use.  Reviewed all steps of insulin pen including attachment of needle, 2-unit air shot, dialing up dose, giving injection, removing needle, disposal of sharps, storage of unused insulin, disposal of insulin etc. Patient given the opportunity to demonstrate how to use an insulin pen. Patient required frequent verbal cues to remember the steps of using an insulin pen. Patient demonstrated how to use insulin pen on 3 different occassions but still required several verbal cues. Patient's daughter is planning to continue to work with patient (will also have Collier Salina, Therapist, sports at [facility] to work with her) to help her with steps of using an insulin pen. Will order Living Well with DM book and insulin starter kit (pens) for patient to review. Reviewed glucose and A1C goals, hypoglycemia, symptoms, along with treatment. Encouraged patient to start monitoring glucose and follow up with PCP regarding DM control. Patient verbalized understanding of information. Informed patient that diabetes coordinator will plan to see  her again tomorrow if still admitted to review insulin pen again.   Thanks, Barnie Alderman, RN, MSN, CDE Diabetes Coordinator Inpatient Diabetes Program [phone] (Team Pager from 8am to 5pm)

## 2021-07-26 DIAGNOSIS — K853 Drug induced acute pancreatitis without necrosis or infection: Secondary | ICD-10-CM

## 2021-07-26 LAB — COMPREHENSIVE METABOLIC PANEL
ALT: 174 U/L — ABNORMAL HIGH (ref 0–44)
AST: 89 U/L — ABNORMAL HIGH (ref 15–41)
Albumin: 2.7 g/dL — ABNORMAL LOW (ref 3.5–5.0)
Alkaline Phosphatase: 279 U/L — ABNORMAL HIGH (ref 38–126)
Anion gap: 7 (ref 5–15)
BUN: 17 mg/dL (ref 8–23)
CO2: 24 mmol/L (ref 22–32)
Calcium: 8.4 mg/dL — ABNORMAL LOW (ref 8.9–10.3)
Chloride: 107 mmol/L (ref 98–111)
Creatinine, Ser: 0.77 mg/dL (ref 0.44–1.00)
GFR, Estimated: >60 mL/min (ref >60)
Glucose, Bld: 102 mg/dL — ABNORMAL HIGH (ref 70–99)
Potassium: 4 mmol/L (ref 3.5–5.1)
Sodium: 138 mmol/L (ref 135–145)
Total Bilirubin: 0.8 mg/dL (ref 0.3–1.2)
Total Protein: 5.7 g/dL — ABNORMAL LOW (ref 6.5–8.1)

## 2021-07-26 LAB — GLUCOSE, CAPILLARY: Glucose-Capillary: 88 mg/dL (ref 70–99)

## 2021-07-26 LAB — LIPASE, BLOOD
Lipase: 561 U/L — ABNORMAL HIGH (ref 11–51)
Lipase: 303 U/L — ABNORMAL HIGH (ref 11–51)

## 2021-07-26 MED ORDER — BLOOD GLUCOSE MONITOR KIT: PACK | 0 refills | Status: DC

## 2021-07-26 MED ORDER — SIMVASTATIN 20 MG PO TABS: 20.0000 mg | ORAL_TABLET | Freq: Every day | ORAL | 3 refills | Status: DC

## 2021-07-26 MED ORDER — LINAGLIPTIN 5 MG PO TABS: 5.0000 mg | ORAL_TABLET | Freq: Every day | ORAL | 1 refills | Status: DC

## 2021-07-26 NOTE — Progress Notes (Signed)
Mobility Specialist - Progress Note   07/26/21 1100  Mobility  Activity Stood at bedside;Dangled on edge of bed  Level of Assistance Minimal assist, patient does 75% or more  Assistive Device Front wheel walker  Distance Ambulated (ft) 0 ft  Activity Response Tolerated well  $Mobility charge 1 Mobility     Pt supine upon arrival using RA. Pt completes bed mobility ModI + extra time and needs MinA to doon clothes. Completes STS MinA and stands ~ 2 minutes with no LOB noted. Pt is left EOB preparing for discharge with alarm set and needs in reach. RN notified.  Merrily Brittle Mobility Specialist 07/26/21, 11:43 AM

## 2021-07-26 NOTE — Progress Notes (Signed)
Inpatient Diabetes Program Recommendations  AACE/ADA: New Consensus Statement on Inpatient Glycemic Control (2015)  Target Ranges:  Prepandial:   less than 140 mg/dL      Peak postprandial:   less than 180 mg/dL (1-2 hours)      Critically ill patients:  140 - 180 mg/dL    Latest Reference Range & Units 07/25/21 07:32 07/25/21 12:00 07/25/21 17:06 07/25/21 17:07 07/25/21 18:28 07/25/21 21:26 07/26/21 07:37  Glucose-Capillary 70 - 99 mg/dL 74 131 (H) 66 (L) 62 (L) 169 (H) 179 (H) 88   Review of Glycemic Control  Diabetes history: DM2 Outpatient Diabetes medications: Jardiance 25 mg QAM, Glipizide XL 5 mg QAM, Tresiba 25 units QHS (just prescribed at PCP office on 07/24/21) Current orders for Inpatient glycemic control: Jardiance 25 mg QAM, Novolog 0-20 units QID   Inpatient Diabetes Program Recommendations:     DM medications: Please discontinue Jardiance as inpatient and outpatient (patient with recent UTI). Dr. Reesa Chew plans to discontinue Tresiba insulin outpatient as well. Therefore, would recommend prescribing Tradjenta 5 mg daily and continue Glipizide XL 5 mg daily.   Discharge needs: At time of discharge, please provide Rx for Glucose monitoring kit (#[id number]).   NOTE: Spoke with patient at bedside and she reports that Dr. Reesa Chew has told her that she will not go home on insulin. Patient states that she is relieved and that she would like to just use oral DM medications if possible. Talked with Dr. Reesa Chew regarding discharge plan. Plan will be to prescribe Tradjenta 5 mg daily and continue Glipizide XL 5 mg daily and patient will follow up with PCP regarding DM control. Spoke with patient again regarding Tradjenta and encouraged patient to start checking glucose at least 2 times per day. Patient states she will start making better dietary choices and eliminate sweet tea.  Patient is very happy that she will not have to take insulin right now since she had so much trouble remembering how to  self administer insulin. Called Neoma Laming (patient's daughter) regarding discharge plan for DM control. Informed her that Dr. Reesa Chew is going to stop the [place], continue Glipizide and start Tradjenta. Neoma Laming plans to be sure to remove the Jardiance from patient's residence and Neoma Laming has the [place] insulin at her house already and will keep it for now. Explained that patient will be asked to check glucose 2 times per day and to follow up with PCP. Neoma Laming appreciative of call and expressed thankfulness for the care her mother has received from everyone at the hospital.  Thanks, Barnie Alderman, RN, MSN, CDE Diabetes Coordinator Inpatient Diabetes Program [phone] (Team Pager from 8am to 5pm)

## 2021-07-26 NOTE — Discharge Summary (Signed)
Physician Discharge Summary   [name] MRN: [id number] DOB: [birth year]  Admit date:     07/24/2021  Discharge date: 07/26/21  Discharge Physician: Lorella Nimrod   PCP: Virginia Crews, MD   Recommendations at discharge:  Please obtain CBC and CMP within a week. Patient was instructed to hold statin which can be resumed on resolution of transaminitis. Avoid Macrobid as they can cause pancreatitis. Jardiance was discontinued due to concern of UTI Patient was started on Tradjenta along with glipizide.  She does not want to take any injectable at this time. Follow-up with primary care provider within a week for further recommendations.  Discharge Diagnoses: Principal Problem:   Acute pancreatitis Active Problems:   Uncontrolled type 2 diabetes mellitus with hyperglycemia, with long-term current use of insulin (HCC)   Elevated LFTs   AKI (acute kidney injury) ([place])   Dyslipidemia   Essential hypertension   Paroxysmal atrial fibrillation [facility])  Hospital Course: Taken from H&P.  [name] is a 88 y.o. Caucasian female with medical history significant for type 2 diabetes mellitus, hypertension and paroxysmal atrial fibrillation, who presented to the emergency room with acute onset of generalized weakness and fatigue with abdominal pain mainly in the lower abdominal area with epigastric and left upper quadrant tenderness without nausea or vomiting or diarrhea.  No dysuria, oliguria or hematuria or flank pain.  No cough or wheezing or dyspnea.  No fever or chills.  The patient was having a recent UTI for which she was started on Macrobid of which she has only 1 to 2 days left from 5 days of therapy.    ED Course: Upon presentation to the emergency room, vital signs were within normal.  Labs revealed elevated lipase of 1032.  High-sensitivity troponin I was 12 and lactic acid 1.6.  INR was 1.5 with PT of 18.3.  Tylenol level was less than 10 and PTT was 36.  CMP was  remarkable for creatinine 1.3 up from 1.18 days ago, alkaline phosphatase of 294 with AST 172 ALT of 281 and BUN was 35 with blood glucose of 361.  CBC showed mild leukocytosis of 10.8.  UA showed more than 500 glucose with 20 ketones and was otherwise unremarkable. EKG as reviewed by me : EKG showed normal sinus rhythm with a rate of 96 Imaging: Two-view chest x-ray showed stable elevation of the left hemidiaphragm with no acute cardiopulmonary disease. CT abdomen and pelvis was done which shows no CT evidence of mesenteric ischemia but shows findings concerning for acute pancreatitis.  There was also noted colonic diverticulosis but no diverticulitis.  Fatty liver and aortic atherosclerosis was noted.  The patient was given 2 L bolus of IV normal saline.  TRH was consulted for admission for further evaluation and management.  5/18: Patient seems improving, nausea and vomiting has been improved.  Abdominal pain improving, lipase decreased to 288.  Patient was started on clear liquid diet and we will advance as tolerated.  5/19: Patient continued to improve.  Eating and drinking without any difficulty.  Liver enzymes are improving, significant improvement in AST and ALT but alkaline phosphatase seems stable.  Patient should be holding her statin for another week and was instructed to have her levels checked by PCP next week before resuming it. She was also told not to use rest of her Macrobid as they can cause pancreatitis. Lipid profile with normal total cholesterol, triglyceride of 157, HDL of 30 and LDL of 62. Her home dose  of Jardiance was also discontinued due to concern of UTI. She was started on [place] was discontinued as she does not want to take any insulin.  We tried teaching her how to inject herself but she was having a lot of difficulty.  Her assisted living will not accept her if she cannot give herself an injection and she will required higher level of care which she does  not want at this time.  Her PCP should be able to monitor her blood glucose level and make appropriate changes.  She will continue with rest of her home medications and will follow-up with her providers.  Assessment and Plan: * Acute pancreatitis She has no history of alcohol abuse or evidence of cholelithiasis.  She was recently started on Macrobid at that can cause pancreatitis and therefore will be stopped.  This could be drug-induced pancreatitis. Seems improving. -Check fasting lipid panel. -Start her on clear liquid diet and advance as tolerated. -Continue with supportive care  Uncontrolled type 2 diabetes mellitus with hyperglycemia, with long-term current use of insulin ([place]) Jardiance was discontinued for concern of recent UTI. Had mild hypoglycemia this morning. -Changed Semglee to 10 units twice daily. -Continue with SSI  Elevated LFTs - This could be related to cholestasis and hepatitis induced by Macrobid as well. - Macrobid will be held off and LFTs will be followed with hydration.  AKI (acute kidney injury) ([place]) Resolved.  Creatinine is normal at baseline. -Monitor renal function -Avoid nephrotoxins  Dyslipidemia - We we will check fasting lipids and hold off statin therapy for now given significantly elevated LFTs.  Essential hypertension Blood pressure within goal. Home dose of lisinopril was initially held for AKI-we can resume if needed. -Continue with Toprol  Paroxysmal atrial fibrillation (HCC) Currently in sinus rhythm. -Continue home metoprolol and Eliquis     Consultants: None Procedures performed: None Disposition: Assisted living Diet recommendation:  Discharge Diet Orders (From admission, onward)     Start     Ordered   07/26/21 0000  Diet - low sodium heart healthy        07/26/21 1012           Cardiac and Carb modified diet DISCHARGE MEDICATION: Allergies as of 07/26/2021       Reactions   Morphine Sulfate Other (See  Comments)   BP bottoms out   Penicillins Diarrhea   Has patient had a PCN reaction causing immediate rash, facial/tongue/throat swelling, SOB or lightheadedness with hypotension: Yes Has patient had a PCN reaction causing severe rash involving mucus membranes or skin necrosis: Yes Has patient had a PCN reaction that required hospitalization No Has patient had a PCN reaction occurring within the last 10 years: No If all of the above answers are "NO", then may proceed with Cephalosporin use.        Medication List     STOP taking these medications    empagliflozin 25 MG Tabs tablet Commonly known as: Jardiance   insulin degludec 100 UNIT/ML FlexTouch Pen Commonly known as: TRESIBA   nitrofurantoin (macrocrystal-monohydrate) 100 MG capsule Commonly known as: Macrobid       TAKE these medications    acetaminophen 325 MG tablet Commonly known as: TYLENOL Take 650 mg by mouth 2 (two) times daily. What changed: Another medication with the same name was removed. Continue taking this medication, and follow the directions you see here.   blood glucose meter kit and supplies Kit Dispense based on patient and insurance preference. Use  up to four times daily as directed. What changed: additional instructions   Clobetasol Propionate 0.05 % shampoo 2-3 TIMES PER WEEK LATHER ON SCALP, LEAVE ON 20 MINUTES, WASH OUT.   Eliquis 5 MG Tabs tablet Generic drug: apixaban TAKE 1 TABLET BY MOUTH TWICE DAILY   FLUOCINOLONE ACETONIDE SCALP EX Apply 0.1 % topically daily as needed.   furosemide 20 MG tablet Commonly known as: LASIX TAKE 1 TABLET BY MOUTH DAILY ONLY IF NEEDED.   glipiZIDE 5 MG 24 hr tablet Commonly known as: GLUCOTROL XL TAKE ONE TABLET EACH MORNING WITH BREAKFAST   HAIR SKIN AND NAILS FORMULA PO Take 1 tablet by mouth daily at 6 (six) AM.   linagliptin 5 MG Tabs tablet Commonly known as: Tradjenta Take 1 tablet (5 mg total) by mouth daily.   lisinopril 20 MG  tablet Commonly known as: ZESTRIL TAKE 1 TABLET BY MOUTH DAILY   metoprolol succinate 25 MG 24 hr tablet Commonly known as: TOPROL-XL TAKE 1 TABLET BY MOUTH ONCE DAILY   multivitamin with minerals Tabs tablet Take 1 tablet by mouth daily.   mupirocin ointment 2 % Commonly known as: BACTROBAN Apply topically 2 (two) times daily.   simvastatin 20 MG tablet Commonly known as: ZOCOR Take 1 tablet (20 mg total) by mouth at bedtime. Hold for 1 week What changed: additional instructions   Vitamin D 50 MCG (2000 UT) Caps Take 2,000 Units by mouth daily.        Follow-up Information     Bacigalupo, Dionne Bucy, MD. Schedule an appointment as soon as possible for a visit in 1 week(s).   Specialty: Family Medicine Contact information: [address] [place] [address] [phone]         Nelva Bush, MD .   Specialty: Cardiology Contact information: [place] [address] [phone]                Discharge Exam: Danley Danker Weights   07/24/21 2243  Weight: 77 kg   General.     In no acute distress. Pulmonary.  Lungs clear bilaterally, normal respiratory effort. CV.  Regular rate and rhythm, no JVD, rub or murmur. Abdomen.  Soft, nontender, nondistended, BS positive. CNS.  Alert and oriented .  No focal neurologic deficit. Extremities.  No edema, no cyanosis, pulses intact and symmetrical. Psychiatry.  Judgment and insight appears normal.   Condition at discharge: stable  The results of significant diagnostics from this hospitalization (including imaging, microbiology, ancillary and laboratory) are listed below for reference.   Imaging Studies: DG Chest 2 View  Result Date: 07/24/2021 CLINICAL DATA:  Chest pain.  Hypertension.  Hyperglycemia. EXAM: CHEST - 2 VIEW COMPARISON:  12/14/2019 FINDINGS: The heart size and mediastinal contours are within normal limits. Aortic atherosclerotic calcification incidentally  noted. Elevation of left hemidiaphragm is again noted. Both lungs are clear. The visualized skeletal structures are unremarkable. IMPRESSION: Stable elevation of left hemidiaphragm. No active cardiopulmonary disease. Electronically Signed   By: Marlaine Hind M.D.   On: 07/24/2021 16:35   CT Angio Abd/Pel W and/or Wo Contrast  Result Date: 07/24/2021 CLINICAL DATA:  Abdominal pain.  Concern for mesenteric ischemia. EXAM: CTA ABDOMEN AND PELVIS WITHOUT AND WITH CONTRAST TECHNIQUE: Multidetector CT imaging of the abdomen and pelvis was performed using the standard protocol during bolus administration of intravenous contrast. Multiplanar reconstructed images and MIPs were obtained and reviewed to evaluate the vascular anatomy. RADIATION DOSE REDUCTION: This exam was performed  according to the departmental dose-optimization program which includes automated exposure control, adjustment of the mA and/or kV according to patient size and/or use of iterative reconstruction technique. CONTRAST:  38mL OMNIPAQUE IOHEXOL 350 MG/ML SOLN COMPARISON:  Abdominal ultrasound dated 07/24/2021 and CT dated 12/23/2019. FINDINGS: VASCULAR Aorta: Moderate atherosclerotic calcification of the abdominal aorta. No aneurysmal dilatation or dissection. No periaortic fluid collection. Celiac: Atherosclerotic calcification of the origin of the celiac axis. The celiac artery and its major branches remain patent. SMA: Patent without evidence of aneurysm, dissection, vasculitis or significant stenosis. Renals: Both renal arteries are patent without evidence of aneurysm, dissection, vasculitis, fibromuscular dysplasia or significant stenosis. IMA: Patent without evidence of aneurysm, dissection, vasculitis or significant stenosis. Inflow: Patent without evidence of aneurysm, dissection, vasculitis or significant stenosis. Proximal Outflow: Bilateral common femoral and visualized portions of the superficial and profunda femoral arteries are patent  without evidence of aneurysm, dissection, vasculitis or significant stenosis. Veins: The IVC is unremarkable. The SMV, splenic vein, and main portal vein are patent. No portal venous gas. Review of the MIP images confirms the above findings. NON-VASCULAR Lower chest: Mild eventration of the left hemidiaphragm. There is minimal bibasilar atelectasis. Coronary vascular calcification. No intra-abdominal free air or free fluid. Hepatobiliary: Fatty liver. No intrahepatic biliary ductal dilatation. The gallbladder is unremarkable. Pancreas: Mild haziness of the peripancreatic fat concerning for acute pancreatitis. Correlation with pancreatic enzymes recommended. No drainable fluid collection/abscess or pseudocyst. Spleen: Normal in size without focal abnormality. Adrenals/Urinary Tract: The adrenal glands are unremarkable. The right kidney is located in the lower abdomen. There is no hydronephrosis on either side. There is a 15 mm left renal inferior pole cyst and additional subcentimeter hypodense lesions which are not characterized. The visualized ureters and urinary bladder appear unremarkable. Stomach/Bowel: There is sigmoid diverticulosis and scattered colonic diverticula. No active inflammatory changes. There is no bowel obstruction or active inflammation. The appendix is normal. Lymphatic: No adenopathy. Reproductive: Hysterectomy.  No adnexal masses. Other: Small fat containing umbilical hernia.  No fluid collection. Musculoskeletal: Osteopenia with degenerative changes of the spine. Status post prior ORIF of right femoral neck. No acute osseous pathology. IMPRESSION: 1. No CT evidence of mesenteric ischemia. 2. Findings concerning for acute pancreatitis. Correlation with pancreatic enzymes recommended. 3. Colonic diverticulosis.  No bowel obstruction.  Normal appendix. 4. Fatty liver. 5. Aortic Atherosclerosis (ICD10-I70.0). Electronically Signed   By: Anner Crete M.D.   On: 07/24/2021 20:55   US Abdomen  Limited RUQ (LIVER/GB)  Result Date: 07/24/2021 CLINICAL DATA:  Right upper quadrant pain EXAM: ULTRASOUND ABDOMEN LIMITED RIGHT UPPER QUADRANT COMPARISON:  12/23/2019 FINDINGS: Gallbladder: No gallstones or wall thickening visualized. No sonographic Murphy sign noted by sonographer. Common bile duct: Diameter: 4 mm Liver: Diffuse increased liver echotexture consistent with hepatic steatosis. No focal liver abnormality. No biliary duct dilation. Portal vein is patent on color Doppler imaging with normal direction of blood flow towards the liver. Other: None. IMPRESSION: 1. No evidence of cholelithiasis or cholecystitis. 2. Hepatic steatosis. Electronically Signed   By: Randa Ngo M.D.   On: 07/24/2021 19:50    Microbiology: Results for orders placed or performed during the hospital encounter of 12/23/19  Blood Culture (routine x 2)     Status: None   Collection Time: 12/23/19  7:40 AM   Specimen: BLOOD  Result Value Ref Range Status   Specimen Description BLOOD BLOOD LEFT FOREARM  Final   Special Requests   Final    BOTTLES DRAWN AEROBIC AND ANAEROBIC Blood  Culture adequate volume   Culture   Final    NO GROWTH 5 DAYS Performed at [facility], [address] [place], [place] [address]    Report Status 12/28/2019 FINAL  Final  Urine culture     Status: None   Collection Time: 12/23/19  7:40 AM   Specimen: In/Out Cath Urine  Result Value Ref Range Status   Specimen Description   Final    IN/OUT CATH URINE Performed at [facility], [address] [place], [place] [address]    Special Requests   Final    NONE Performed at [facility], [address], [place], [place] [address]    Culture   Final    NO GROWTH Performed at [facility], [place] [address], [place], [place] [address]    Report Status 12/24/2019 FINAL  Final  Respiratory Panel by RT PCR (Flu A&B, Covid) - Nasopharyngeal Swab     Status: None   Collection Time: 12/23/19  7:40 AM    Specimen: Nasopharyngeal Swab  Result Value Ref Range Status   SARS Coronavirus 2 by RT PCR NEGATIVE NEGATIVE Final    Comment: (NOTE) SARS-CoV-2 target nucleic acids are NOT DETECTED.  The SARS-CoV-2 RNA is generally detectable in upper respiratoy specimens during the acute phase of infection. The lowest concentration of SARS-CoV-2 viral copies this assay can detect is 131 copies/mL. A negative result does not preclude SARS-Cov-2 infection and should not be used as the sole basis for treatment or other patient management decisions. A negative result may occur with  improper specimen collection/handling, submission of specimen other than nasopharyngeal swab, presence of viral mutation(s) within the areas targeted by this assay, and inadequate number of viral copies (<131 copies/mL). A negative result must be combined with clinical observations, patient history, and epidemiological information. The expected result is Negative.  Fact Sheet for Patients:  PinkCheek.be  Fact Sheet for Healthcare Providers:  GravelBags.it  This test is no t yet approved or cleared by the [place] FDA and  has been authorized for detection and/or diagnosis of SARS-CoV-2 by FDA under an Emergency Use Authorization (EUA). This EUA will remain  in effect (meaning this test can be used) for the duration of the COVID-19 declaration under Section 564(b)(1) of the Act, 21 U.S.C. section 360bbb-3(b)(1), unless the authorization is terminated or revoked sooner.     Influenza A by PCR NEGATIVE NEGATIVE Final   Influenza B by PCR NEGATIVE NEGATIVE Final    Comment: (NOTE) The Xpert Xpress SARS-CoV-2/FLU/RSV assay is intended as an aid in  the diagnosis of influenza from Nasopharyngeal swab specimens and  should not be used as a sole basis for treatment. Nasal washings and  aspirates are unacceptable for Xpert Xpress SARS-CoV-2/FLU/RSV   testing.  Fact Sheet for Patients: PinkCheek.be  Fact Sheet for Healthcare Providers: GravelBags.it  This test is not yet approved or cleared by the [place] FDA and  has been authorized for detection and/or diagnosis of SARS-CoV-2 by  FDA under an Emergency Use Authorization (EUA). This EUA will remain  in effect (meaning this test can be used) for the duration of the  Covid-19 declaration under Section 564(b)(1) of the Act, 21  U.S.C. section 360bbb-3(b)(1), unless the authorization is  terminated or revoked. Performed at [facility], [address], [place],  [address]   Blood Culture (routine x 2)     Status: None   Collection Time: 12/23/19  8:15 AM  Specimen: BLOOD  Result Value Ref Range Status   Specimen Description BLOOD BLOOD LEFT HAND  Final   Special Requests   Final    BOTTLES DRAWN AEROBIC AND ANAEROBIC Blood Culture results may not be optimal due to an inadequate volume of blood received in culture bottles   Culture   Final    NO GROWTH 5 DAYS Performed at [facility], [address] [place], [place] [address]    Report Status 12/28/2019 FINAL  Final  MRSA PCR Screening     Status: None   Collection Time: 12/24/19  5:30 PM   Specimen: Nasopharyngeal  Result Value Ref Range Status   MRSA by PCR NEGATIVE NEGATIVE Final    Comment:        The GeneXpert MRSA Assay (FDA approved for NASAL specimens only), is one component of a comprehensive MRSA colonization surveillance program. It is not intended to diagnose MRSA infection nor to guide or monitor treatment for MRSA infections. Performed at [facility], [address] [place], [place] [address]     Labs: CBC: Recent Labs  Lab 07/24/21 1610 07/25/21 0433  WBC 10.8* 9.0  NEUTROABS 7.5  --   HGB 15.1* 12.8  HCT 46.4* 38.6  MCV 92.4 92.6  PLT 279 518   Basic Metabolic Panel: Recent Labs  Lab  07/24/21 1610 07/25/21 0433 07/26/21 0334  NA 135 140 138  K 5.0 4.1 4.0  CL 99 113* 107  CO2 $Re'22 24 24  'zIf$ GLUCOSE 361* 86 102*  BUN 35* 26* 17  CREATININE 1.30* 0.83 0.77  CALCIUM 9.4 8.3* 8.4*   Liver Function Tests: Recent Labs  Lab 07/24/21 1610 07/26/21 0334  AST 172* 89*  ALT 281* 174*  ALKPHOS 294* 279*  BILITOT 0.8 0.8  PROT 8.0 5.7*  ALBUMIN 3.8 2.7*   CBG: Recent Labs  Lab 07/25/21 1706 07/25/21 1707 07/25/21 1828 07/25/21 2126 07/26/21 0737  GLUCAP 66* 62* 169* 179* 88    Discharge time spent: greater than 30 minutes.  This record has been created using Systems analyst. Errors have been sought and corrected,but may not always be located. Such creation errors do not reflect on the standard of care.   Signed: Lorella Nimrod, MD [facility] 07/26/2021

## 2021-07-26 NOTE — TOC Transition Note (Addendum)
Transition of Care [facility]) - CM/SW Discharge Note   Patient Details  Name: [name] MRN: [id number] Date of Birth: 08/24/1931  [facility]) CM/SW Contact:  Eileen Stanford, LCSW Phone Number: 07/26/2021, 10:29 AM   Clinical Narrative:   DC summary faxed to [facility] and CSW spoke with admitting RN. RN states daughter can bring pt back. RN at bedside to let pt's daughter know pt can return to [facility]. Daughter to transport. RN to call [phone] for report.    Final next level of care: Assisted Living Barriers to Discharge: No Barriers Identified   Patient Goals and CMS Choice        Discharge Placement                Patient to be transferred to facility by: daughter   Patient and family notified of of transfer: 07/26/21  Discharge Plan and Services                                     Social Determinants of Health (SDOH) Interventions     Readmission Risk Interventions     View : No data to display.

## 2021-07-26 NOTE — Care Management Important Message (Signed)
Important Message  Patient Details  Name: [name] MRN: [id number] Date of Birth: [birth year]   Medicare Important Message Given:  N/A - LOS <3 / Initial given by admissions     Dannette Barbara 07/26/2021, 8:15 AM

## 2021-07-29 ENCOUNTER — Telehealth: Payer: Self-pay

## 2021-07-29 NOTE — Telephone Encounter (Signed)
Pt is at [facility] and following patient at this time

## 2021-08-01 ENCOUNTER — Encounter: Payer: Self-pay | Admitting: Internal Medicine

## 2021-08-01 ENCOUNTER — Ambulatory Visit (INDEPENDENT_AMBULATORY_CARE_PROVIDER_SITE_OTHER): Payer: Medicare Other | Admitting: Internal Medicine

## 2021-08-01 DIAGNOSIS — I480 Paroxysmal atrial fibrillation: Secondary | ICD-10-CM | POA: Diagnosis not present

## 2021-08-01 DIAGNOSIS — M25551 Pain in right hip: Secondary | ICD-10-CM

## 2021-08-01 DIAGNOSIS — K8500 Idiopathic acute pancreatitis without necrosis or infection: Secondary | ICD-10-CM

## 2021-08-01 DIAGNOSIS — I10 Essential (primary) hypertension: Secondary | ICD-10-CM

## 2021-08-01 DIAGNOSIS — E0959 Drug or chemical induced diabetes mellitus with other circulatory complications: Secondary | ICD-10-CM

## 2021-08-01 DIAGNOSIS — E119 Type 2 diabetes mellitus without complications: Secondary | ICD-10-CM

## 2021-08-01 MED ORDER — METFORMIN HCL ER 500 MG PO TB24
500.0000 mg | ORAL_TABLET | Freq: Every day | ORAL | 11 refills | Status: DC
Start: 2021-08-01 — End: 2021-09-05

## 2021-08-01 MED ORDER — GLIPIZIDE 5 MG PO TABS: 5.0000 mg | ORAL_TABLET | Freq: Two times a day (BID) | ORAL | 11 refills | Status: DC

## 2021-08-01 NOTE — Assessment & Plan Note (Signed)
Has been out of control----but discussed age appropriate goals (under 9% A1c) Will increase glipizide to 5 bid Continue trajenta 5 Start metformin 500
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
## 2021-08-01 NOTE — Assessment & Plan Note (Signed)
Presumably from the macrobid Clinically better now Will recheck labs next week---LFTs/alk phos were still fairly elevated 

## 2021-08-01 NOTE — Assessment & Plan Note (Signed)
Controlled on the metoprolol '25mg'$  daily On eliquis 5 bid

## 2021-08-01 NOTE — Assessment & Plan Note (Signed)
BP Readings from Last 3 Encounters:  08/01/21 126/73  07/26/21 139/67  07/24/21 128/80   Good control on metoprolol 25 and lisinopril 20 daily
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
## 2021-08-08 ENCOUNTER — Other Ambulatory Visit: Payer: Self-pay | Admitting: Internal Medicine

## 2021-08-15 ENCOUNTER — Telehealth: Payer: Self-pay

## 2021-08-15 NOTE — Telephone Encounter (Signed)
FYI

## 2021-08-15 NOTE — Telephone Encounter (Signed)
Copied from [place] [id number]. Topic: General - Inquiry >> Aug 15, 2021  8:36 AM [name] wrote: Reason for CRM: pt is at [facility] seeing the in house doctor.  She does not want to be taken off Dr B as a pt, but for the next few months they want the dr at [facility] to care for her until she gets stronger.

## 2021-08-19 ENCOUNTER — Ambulatory Visit: Payer: Medicare Other | Admitting: Family Medicine

## 2021-08-21 ENCOUNTER — Other Ambulatory Visit: Payer: Self-pay | Admitting: Internal Medicine

## 2021-09-04 ENCOUNTER — Non-Acute Institutional Stay: Payer: Medicare Other | Admitting: Internal Medicine

## 2021-09-04 VITALS — BP 113/69 | HR 70 | Temp 97.3°F | Resp 18 | Wt 172.6 lb

## 2021-09-04 DIAGNOSIS — E0959 Drug or chemical induced diabetes mellitus with other circulatory complications: Secondary | ICD-10-CM

## 2021-09-04 DIAGNOSIS — I480 Paroxysmal atrial fibrillation: Secondary | ICD-10-CM

## 2021-09-04 DIAGNOSIS — L089 Local infection of the skin and subcutaneous tissue, unspecified: Secondary | ICD-10-CM

## 2021-09-04 DIAGNOSIS — M1990 Unspecified osteoarthritis, unspecified site: Secondary | ICD-10-CM | POA: Diagnosis not present

## 2021-09-04 DIAGNOSIS — I10 Essential (primary) hypertension: Secondary | ICD-10-CM | POA: Diagnosis not present

## 2021-09-04 DIAGNOSIS — I700 Atherosclerosis of aorta: Secondary | ICD-10-CM

## 2021-09-04 DIAGNOSIS — E785 Hyperlipidemia, unspecified: Secondary | ICD-10-CM

## 2021-09-04 NOTE — Progress Notes (Signed)
Subjective:    Patient ID: [name], female    DOB: April 26, 1931, 88 y.o.   MRN: [id number]  HPI  Resident seen in ALF [address] Reviewed with RN he reports blood sugars are running 184-420.  Resident has concerns about redness/bleeding to the second toe of her right foot.  She reports this occurred about 1 week ago.  She reports it is not tender unless she touches it.  She has not noticed any drainage from the area.  She reports she is sleeping well.  She is independent with ADLs.  She walks with a rolling walker.  She denies recent falls.  She does have some dyspnea on exertion but denies chest pain, reflux.  She has some intermittent right hip pain that is well controlled with current meds.  Her appetite is good, weight is stable.  Her bowels are moving normally.  She does have some urinary leak which which she manages herself.  She reports her mood has been good lately.  DM 2: Her last A1c was 10.9%, 07/2021.  She is taking Metformin, Glipizide and Tradjenta as prescribed.    HTN: Her BP today is 113/69.  She is taking Lisinopril, Metoprolol and Furosemide as prescribed.  ECG from 07/2021 reviewed.  A-fib: Managed on Metoprolol and Eliquis.  ECG from 07/2021 reviewed.  OA: Mainly in her right hip.  She takes Tylenol as needed with good relief of symptoms.  HLD with Aortic Atherosclerosis: Her last LDL was 62, triglycerides 157, 07/2021.  She is not taking any cholesterol-lowering medication at this time.  She tries to consume a low-fat diet.  Review of Systems    Past Medical History:  Diagnosis Date   Basal cell carcinoma 03/30/2008   left upper arm/excision   Basal cell carcinoma 03/31/2008   left upper arm, right lower leg   Basal cell carcinoma 01/18/2009   left upper arm   Collagenous colitis    Diabetes mellitus without complication ([place])    Diverticulitis    Heart murmur    at birth, none since   History of echocardiogram    a. 11/2015: echo showing EF of 60-65%  with no WMA. Grade 1 DD and mild MR noted.    Hypertension    Intertrochanteric fracture of right femur ([place]) 06/11/2019   PAF (paroxysmal atrial fibrillation) ([place])    a. initially diagnosed in 08/2016 --> started on Eliquis   Squamous cell carcinoma of skin 05/16/2020   Left nasal tip anterior to ala, refer to [facility]   Syncope    a. initially occurring in Fall 2016 b. Hospitalized in 10/2015 and 11/2015 for recurrent episodes.     Current Outpatient Medications  Medication Sig Dispense Refill   acetaminophen (TYLENOL) 325 MG tablet Take 650 mg by mouth 2 (two) times daily.     blood glucose meter kit and supplies KIT Dispense based on patient and insurance preference. Use up to four times daily as directed. 1 each 0   Cholecalciferol (VITAMIN D) 2000 UNITS CAPS Take 2,000 Units by mouth daily.      Clobetasol Propionate 0.05 % shampoo 2-3 TIMES PER WEEK LATHER ON SCALP, LEAVE ON 20 MINUTES, WASH OUT. 118 mL 3   ELIQUIS 5 MG TABS tablet TAKE 1 TABLET BY MOUTH TWICE DAILY 180 tablet 1   FLUOCINOLONE ACETONIDE SCALP EX Apply 0.1 % topically daily as needed.     furosemide (LASIX) 20 MG tablet TAKE 1 TABLET BY MOUTH DAILY ONLY IF NEEDED. [place]  tablet 1   glipiZIDE (GLUCOTROL) 5 MG tablet Take 1 tablet (5 mg total) by mouth 2 (two) times daily before a meal. 60 tablet 11   linagliptin (TRADJENTA) 5 MG TABS tablet Take 1 tablet (5 mg total) by mouth daily. 30 tablet 1   lisinopril (ZESTRIL) 20 MG tablet TAKE 1 TABLET BY MOUTH DAILY 90 tablet 0   metFORMIN (GLUCOPHAGE-XR) 500 MG 24 hr tablet Take 1 tablet (500 mg total) by mouth daily with breakfast. 30 tablet 11   metoprolol succinate (TOPROL-XL) 25 MG 24 hr tablet TAKE 1 TABLET BY MOUTH ONCE DAILY 90 tablet 3   Multiple Vitamin (MULTIVITAMIN WITH MINERALS) TABS tablet Take 1 tablet by mouth daily.     Multiple Vitamins-Minerals (HAIR SKIN AND NAILS FORMULA PO) Take 1 tablet by mouth daily at 6 (six) AM.     No current facility-administered  medications for this visit.    Allergies  Allergen Reactions   Morphine Sulfate Other (See Comments)    BP bottoms out   Penicillins Diarrhea    Has patient had a PCN reaction causing immediate rash, facial/tongue/throat swelling, SOB or lightheadedness with hypotension: Yes Has patient had a PCN reaction causing severe rash involving mucus membranes or skin necrosis: Yes Has patient had a PCN reaction that required hospitalization No Has patient had a PCN reaction occurring within the last 10 years: No If all of the above answers are "NO", then may proceed with Cephalosporin use.    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  Tobacco Use   Smoking status: Former    Types: Cigarettes    Quit date: 03/09/1978    Years since quitting: 43.5   Smokeless tobacco: Never  Vaping Use   Vaping Use: Never used  Substance and Sexual Activity   Alcohol use: Not Currently    Comment: once a month--mixed drink   Drug use: No   Sexual activity: Not on file  Other Topics Concern   Not on file  Social History Narrative   2 daughters and a son (8 local plus [place] and [place])      Has living will    Daughter Jackelyn Poling is [facility] POA   Has DNR   No feeding tube   Social Determinants of Health   Financial Resource Strain: Low Risk  (04/25/2020)   Overall Financial Resource Strain (CARDIA)    Difficulty of Paying Living Expenses: Not hard at all  Food Insecurity: No Food Insecurity (04/25/2020)   Hunger Vital Sign    Worried About Running Out of Food in the Last Year: Never true    [place] in the Last Year: Never true   Transportation Needs: No Transportation Needs (04/25/2020)   PRAPARE - Hydrologist (Medical): No    Lack of Transportation (Non-Medical): No  Physical Activity: Inactive (04/25/2020)   Exercise Vital Sign    Days of Exercise per Week: 0 days    Minutes of Exercise per Session: 0 min  Stress: No Stress Concern Present (04/25/2020)   [employer]  of Occupational Health - Occupational Stress Questionnaire    Feeling of Stress : Not at all  Social Connections: Moderately Isolated (04/25/2020)   Social Connection and Isolation Panel [NHANES]    Frequency of Communication with Friends and Family: More than three times a week    Frequency of Social Gatherings with Friends and Family: More than three times a week    Attends Religious Services: More than 4 times per year    Active Member of [employer] or Organizations: No    Attends Archivist Meetings: Never    Marital Status: Widowed  Intimate Partner Violence: Not At Risk (04/25/2020)   Humiliation, Afraid, Rape, and Kick questionnaire    Fear of Current or Ex-Partner: No    Emotionally Abused: No    Physically Abused: No    Sexually Abused: No     Constitutional: Denies fever, malaise, fatigue, headache or abrupt weight changes.  HEENT: Denies eye pain, eye redness, ear pain, ringing in the ears, wax buildup, runny nose, nasal congestion, bloody nose, or sore throat. Respiratory: Patient reports shortness of breath with exertion.  Denies difficulty breathing, cough or sputum production.   Cardiovascular: Denies chest pain, chest tightness, palpitations or swelling in the hands or feet.  Gastrointestinal: Denies abdominal pain, bloating, constipation, diarrhea or blood in the stool.  GU: Patient reports urinary leakage.  Denies frequency, pain with urination, burning sensation, blood in urine, odor or discharge. Musculoskeletal: Patient reports intermittent right hip pain.  Denies decrease in range of  motion, difficulty with gait, muscle pain or joint pain and swelling.  Skin: Patient reports bleeding of second toe right foot.  Denies redness, rashes, lesions or ulcercations.  Neurological: Denies dizziness, difficulty with memory, difficulty with speech or problems with balance and coordination.  Psych: Denies anxiety, depression, SI/HI.  No other specific complaints in a complete review of systems (except as listed in HPI above).  Objective:   Physical Exam   BP 113/69   Pulse 70   Temp (!) 97.3 F (36.3 C)   Resp 18   Wt 172 lb 9.6 oz (78.3 kg)   SpO2 95%   BMI 30.57 kg/m  Wt Readings from Last 3 Encounters:  09/05/21 172 lb 9.6 oz (78.3 kg)  08/01/21 175 lb 12.8 oz (79.7 kg)  07/24/21 169 lb 12.1 oz (77 kg)    General: Appears her stated age, obese, in NAD. Skin: She has infected skin tear of her second toe, right foot. HEENT: Head: normal shape and size; Eyes: sclera white, no icterus, conjunctiva pink, PERRLA and EOMs intact;  Neck: No nodes or JVD. Cardiovascular: Normal rate and rhythm. S1,S2 noted.  No murmur, rubs or gallops noted. No JVD or BLE edema. No carotid bruits noted. Pulmonary/Chest: Normal effort and positive vesicular breath sounds. No respiratory distress. No wheezes, rales or ronchi noted.  Abdomen: Soft and nontender. Normal bowel sounds.  Musculoskeletal: Gait slow and steady with use of rolling walker. Neurological: Alert and oriented.  Psychiatric: Mood and affect normal.  BMET    Component Value Date/Time   NA 138 07/26/2021 0334   NA 137 07/16/2021 0000   K 4.0 07/26/2021 0334   CL 107 07/26/2021 0334   CO2 24 07/26/2021 0334   GLUCOSE 102 (H) 07/26/2021 0334   BUN 17 07/26/2021 0334   BUN 31 (A) 07/16/2021 0000   CREATININE 0.77 07/26/2021 0334   CALCIUM 8.4 (L) 07/26/2021 0334   GFRNONAA >60 07/26/2021 0334   GFRAA 80 04/12/2020  0745    Lipid Panel     Component Value Date/Time   CHOL 123 07/25/2021 0433   CHOL 258 (H)  04/29/2021 1300   TRIG 157 (H) 07/25/2021 0433   HDL 30 (L) 07/25/2021 0433   HDL 38 (L) 04/29/2021 1300   CHOLHDL 4.1 07/25/2021 0433   VLDL 31 07/25/2021 0433   LDLCALC 62 07/25/2021 0433   LDLCALC 146 (H) 04/29/2021 1300    CBC    Component Value Date/Time   WBC 9.0 07/25/2021 0433   RBC 4.17 07/25/2021 0433   HGB 12.8 07/25/2021 0433   HGB 13.6 04/29/2021 1300   HCT 38.6 07/25/2021 0433   HCT 40.7 04/29/2021 1300   PLT 227 07/25/2021 0433   PLT 252 04/29/2021 1300   MCV 92.6 07/25/2021 0433   MCV 92 04/29/2021 1300   MCH 30.7 07/25/2021 0433   MCHC 33.2 07/25/2021 0433   RDW 12.1 07/25/2021 0433   RDW 11.7 04/29/2021 1300   LYMPHSABS 1.6 07/24/2021 1610   LYMPHSABS 3.0 12/05/2019 1145   MONOABS 1.0 07/24/2021 1610   EOSABS 0.5 07/24/2021 1610   EOSABS 0.2 12/05/2019 1145   BASOSABS 0.1 07/24/2021 1610   BASOSABS 0.0 12/05/2019 1145    Hgb A1C Lab Results  Component Value Date   HGBA1C 10.9 07/16/2021           Assessment & Plan:   Infected Skin Tear, 2nd Toe, Right Foot:  RX for Keflex 500 mg TID x 7 days Encouraged Epsom Salt soaks  Will reassess as needed  Webb Silversmith, NP

## 2021-09-05 ENCOUNTER — Encounter: Payer: Self-pay | Admitting: Internal Medicine

## 2021-09-05 DIAGNOSIS — I700 Atherosclerosis of aorta: Secondary | ICD-10-CM | POA: Insufficient documentation

## 2021-09-05 MED ORDER — METFORMIN HCL ER 500 MG PO TB24
500.0000 mg | ORAL_TABLET | Freq: Two times a day (BID) | ORAL | 0 refills | Status: DC
Start: 2021-09-05 — End: 2023-04-25

## 2021-09-05 MED ORDER — GLIPIZIDE ER 10 MG PO TB24: 10.0000 mg | ORAL_TABLET | Freq: Two times a day (BID) | ORAL | Status: DC

## 2021-09-05 NOTE — Assessment & Plan Note (Signed)
Not on statin given age Continue Eliquis

## 2021-09-05 NOTE — Assessment & Plan Note (Signed)
Having extremely elevated blood sugars and refusing to start insulin Increase metformin to 1000 mg twice daily Continue glipizide 5 mg XL twice daily Continue Tradjenta 5 mg daily Continue to monitor sugars

## 2021-09-05 NOTE — Assessment & Plan Note (Signed)
Continue Tylenol as needed 

## 2021-09-05 NOTE — Assessment & Plan Note (Signed)
Controlled on lisinopril, metoprolol and furosemide We will monitor

## 2021-09-05 NOTE — Assessment & Plan Note (Signed)
Continue metoprolol and Eliquis

## 2021-09-17 ENCOUNTER — Telehealth: Payer: Self-pay

## 2021-09-17 MED ORDER — LINAGLIPTIN 5 MG PO TABS
5.0000 mg | ORAL_TABLET | Freq: Every day | ORAL | 3 refills | Status: DC
Start: 2021-09-17 — End: 2023-04-25

## 2021-09-17 NOTE — Telephone Encounter (Signed)
MEDICATION: linagliptin (TRADJENTA) 5 MG TABS tablet  PHARMACY: [facility] - [place], [place] - [address]  Comments: Patient is due for the refill tomorrow.   **Let patient know to contact pharmacy at the end of the day to make sure medication is ready. **  ** Please notify patient to allow 48-72 hours to process**  **Encourage patient to contact the pharmacy for refills or they can request refills through [employer]**

## 2021-09-17 NOTE — Telephone Encounter (Signed)
Rx sent electronically.  

## 2021-10-14 ENCOUNTER — Other Ambulatory Visit: Payer: Self-pay | Admitting: Internal Medicine

## 2021-10-28 ENCOUNTER — Ambulatory Visit: Payer: Medicare Other | Admitting: Family Medicine

## 2021-11-14 ENCOUNTER — Encounter: Payer: Self-pay | Admitting: Nurse Practitioner

## 2021-11-14 ENCOUNTER — Non-Acute Institutional Stay: Payer: Medicare Other | Admitting: Nurse Practitioner

## 2021-11-14 DIAGNOSIS — L602 Onychogryphosis: Secondary | ICD-10-CM | POA: Diagnosis not present

## 2021-11-14 DIAGNOSIS — Z66 Do not resuscitate: Secondary | ICD-10-CM | POA: Diagnosis not present

## 2021-11-14 NOTE — Progress Notes (Signed)
Location:   [facility] Room Number: 302-L Place of Service:  ALF (13) Provider:   K. , NP   Patient Care Team: Letvak, Richard I, MD as PCP - General (Internal Medicine) End, Christopher, MD as PCP - Cardiology (Cardiology) Brasington, Chadwick, MD as Referring Physician (Ophthalmology)  Extended Emergency Contact Information Primary Emergency Contact: Dodson,Deborah H Address: [address]          [place], [place] [address] [place] Home Phone: [phone] Work Phone: [phone] Relation: Daughter  Code Status:  DNR Goals of care: Advanced Directive information    11/14/2021    2:02 PM  Advanced Directives  Does Patient Have a Medical Advance Directive? Yes  Type of Advance Directive Out of facility DNR (pink MOST or yellow form)  Does patient want to make changes to medical advance directive? No - Patient declined  Pre-existing out of facility DNR order (yellow form or pink MOST form) Yellow form placed in chart (order not valid for inpatient use)     Chief Complaint  Patient presents with   Acute Visit    Toenail concerns, see at [facility]     HPI:  Pt is a 88 y.o. female seen today for an acute visit for overgrown toenails. She generally see podiatrist but will be out of town Toenails are overgrown and effect her gait and makes wearing shoes uncomfortable.    Past Medical History:  Diagnosis Date   Basal cell carcinoma 03/30/2008   left upper arm/excision   Basal cell carcinoma 03/31/2008   left upper arm, right lower leg   Basal cell carcinoma 01/18/2009   left upper arm   Collagenous colitis    Diabetes mellitus without complication (HCC)    Diverticulitis    Heart murmur    at birth, none since   History of echocardiogram    a. 11/2015: echo showing EF of 60-65% with no WMA. Grade 1 DD and mild MR noted.    Hypertension    Intertrochanteric fracture of right femur (HCC) 06/11/2019   PAF  (paroxysmal atrial fibrillation) (HCC)    a. initially diagnosed in 08/2016 --> started on Eliquis   Squamous cell carcinoma of skin 05/16/2020   Left nasal tip anterior to ala, refer to [facility]   Syncope    a. initially occurring in Fall 2016 b. Hospitalized in 10/2015 and 11/2015 for recurrent episodes.    Past Surgical History:  Procedure Laterality Date   ABDOMINAL HYSTERECTOMY     BACK SURGERY  08/08/2008   Lumbar discectomy   COLONOSCOPY WITH PROPOFOL     EXCISION/RELEASE BURSA HIP Left 05/03/2019   Procedure: HIP ABDUCTOR REPAIR;  Surgeon: Menz, Michael, MD;  Location: [facility] ORS;  Service: Orthopedics;  Laterality: Left;   INTRAMEDULLARY (IM) NAIL INTERTROCHANTERIC Right 06/13/2019   Procedure: INTRAMEDULLARY (IM) NAIL INTERTROCHANTRIC;  Surgeon: Menz, Michael, MD;  Location: [facility] ORS;  Service: Orthopedics;  Laterality: Right;   NECK SURGERY  1980    Allergies  Allergen Reactions   Morphine Sulfate Other (See Comments)    BP bottoms out   Penicillins Diarrhea    Has patient had a PCN reaction causing immediate rash, facial/tongue/throat swelling, SOB or lightheadedness with hypotension: Yes Has patient had a PCN reaction causing severe rash involving mucus membranes or skin necrosis: Yes Has patient had a PCN reaction that required hospitalization No Has patient had a PCN reaction occurring within the last 10 years: No If all of the   above answers are "NO", then may proceed with Cephalosporin use.    Outpatient Encounter Medications as of 11/14/2021  Medication Sig   acetaminophen (TYLENOL) 325 MG tablet Take 650 mg by mouth 2 (two) times daily. Scheduled and 2 tablets by mouth every 4 hours as needed for pain   bisacodyl (DULCOLAX) 10 MG suppository Place 10 mg rectally as needed for moderate constipation (every 24 hours for constipation).   Calcium & Magnesium Carbonates (MYLANTA PO) Take by mouth.  200-200-20 mg/5 mLGive 2 Tbsp by mouth every 4 hours as needed for gas,  indigestion, or upset stomach supervised self-administration Notify MD if no relief   cetirizine (ZYRTEC) 10 MG tablet Take 10 mg by mouth as needed for allergies (not to exceed 10 days, notify MD if no relief).   Cholecalciferol (VITAMIN D) 2000 UNITS CAPS Take 2,000 Units by mouth daily.    Clobetasol Propionate 0.05 % shampoo 2-3 TIMES PER WEEK LATHER ON SCALP, LEAVE ON 20 MINUTES, WASH OUT.   dextromethorphan-guaiFENesin (ROBITUSSIN-DM) 10-100 MG/5ML liquid Give 2 tsp by mouth every 4 hours as needed for coughing. supervised self-administration Notify MD if no relief in 3 days or continued use   diphenhydrAMINE (BENADRYL) 25 MG tablet Take 25 mg by mouth as needed (for an allergic reaction , notify MD of use).   ELIQUIS 5 MG TABS tablet TAKE 1 TABLET BY MOUTH TWICE DAILY   Ensure (ENSURE) Take 237 mLs by mouth as needed (For weight loss or decreased intake).   fluocinolone (SYNALAR) 0.01 % external solution Apply 1 Application topically at bedtime. Apply to scalp   furosemide (LASIX) 20 MG tablet TAKE 1 TABLET BY MOUTH DAILY ONLY IF NEEDED.   glipiZIDE (GLIPIZIDE XL) 10 MG 24 hr tablet Take 1 tablet (10 mg total) by mouth 2 (two) times daily.   linagliptin (TRADJENTA) 5 MG TABS tablet Take 1 tablet (5 mg total) by mouth daily.   lisinopril (ZESTRIL) 20 MG tablet TAKE 1 TABLET BY MOUTH DAILY   magnesium hydroxide (MILK OF MAGNESIA) 400 MG/5ML suspension Give 2 Tbsp by mouth as needed for constipation. supervised self-administration daily, Call MD if no relief in 3 days of continued use.   metFORMIN (GLUCOPHAGE-XR) 500 MG 24 hr tablet Take 1 tablet (500 mg total) by mouth 2 (two) times daily.   metoprolol succinate (TOPROL-XL) 25 MG 24 hr tablet TAKE 1 TABLET BY MOUTH ONCE DAILY   Multiple Vitamin (MULTIVITAMIN WITH MINERALS) TABS tablet Take 1 tablet by mouth daily.   Multiple Vitamins-Minerals (HAIR SKIN AND NAILS FORMULA PO) Take 1 tablet by mouth daily at 6 (six) AM.   nystatin  (MYCOSTATIN/NYSTOP) powder Apply 1 Application topically 2 (two) times daily as needed.   ondansetron (ZOFRAN) 4 MG tablet Take 4 mg by mouth every 6 (six) hours as needed for nausea or vomiting.   OXYGEN Inhale 2 L into the lungs as needed (For SOb or dyspnea).   polyethylene glycol (MIRALAX / GLYCOLAX) 17 g packet Take 17 g by mouth every 12 (twelve) hours as needed (for constipation).   [DISCONTINUED] blood glucose meter kit and supplies KIT Dispense based on patient and insurance preference. Use up to four times daily as directed.   [DISCONTINUED] FLUOCINOLONE ACETONIDE SCALP EX Apply 0.1 % topically daily as needed. (Patient not taking: Reported on 09/05/2021)   No facility-administered encounter medications on file as of 11/14/2021.    Review of Systems  Toe nails overgrown and thick   Immunization History  Administered Date(s) Administered  Fluad Quad(high Dose 65+) 12/20/2018   Influenza Split 01/01/2009, 01/02/2010, 01/13/2011, 12/22/2011   Influenza, High Dose Seasonal PF 01/18/2014, 01/01/2015, 11/30/2015, 12/01/2016, 01/11/2018, 12/08/2019   Influenza-Unspecified 01/08/2013, 11/08/2020   PFIZER(Purple Top)SARS-COV-2 Vaccination 04/01/2019, 04/22/2019, 12/05/2019   Pneumococcal Conjugate-13 10/19/2013   Pneumococcal Polysaccharide-23 01/02/2010   Td 11/02/2003, 01/13/2011   Tdap 01/13/2011, 02/28/2015   Zoster, Live 07/14/2007   Pertinent  Health Maintenance Due  Topic Date Due   OPHTHALMOLOGY EXAM  06/08/2021   INFLUENZA VACCINE  10/08/2021   HEMOGLOBIN A1C  01/16/2022   FOOT EXAM  04/29/2022   DEXA SCAN  Completed      10/19/2020   11:04 AM 07/24/2021    4:09 PM 07/24/2021   10:50 PM 07/25/2021   10:02 AM 07/26/2021    1:00 AM  Fall Risk  Falls in the past year? 0      Was there an injury with Fall? 0      Fall Risk Category Calculator 0      Fall Risk Category Low      Patient Fall Risk Level Low fall risk Moderate fall risk High fall risk High fall risk High fall  risk  Patient at Risk for Falls Due to Impaired balance/gait      Fall risk Follow up Falls evaluation completed       Functional Status Survey:    Vitals:   11/14/21 1359  BP: 122/66  Pulse: 70  Temp: 97.6 F (36.4 C)  Weight: 174 lb (78.9 kg)  Height: 5' 3" (1.6 m)   Body mass index is 30.82 kg/m. Physical Exam Constitutional:      Appearance: Normal appearance.  Pulmonary:     Effort: Pulmonary effort is normal.  Feet:     Right foot:     Toenail Condition: Right toenails are abnormally thick and long. Fungal disease present.    Left foot:     Toenail Condition: Left toenails are abnormally thick and long. Fungal disease present. Neurological:     Mental Status: She is alert. Mental status is at baseline.  Psychiatric:        Mood and Affect: Mood normal.     Labs reviewed: Recent Labs    07/24/21 1610 07/25/21 0433 07/26/21 0334  NA 135 140 138  K 5.0 4.1 4.0  CL 99 113* 107  CO2 22 24 24  GLUCOSE 361* 86 102*  BUN 35* 26* 17  CREATININE 1.30* 0.83 0.77  CALCIUM 9.4 8.3* 8.4*   Recent Labs    04/29/21 1300 07/16/21 0000 07/24/21 1610 07/26/21 0334  AST 19 12* 172* 89*  ALT 30  --  281* 174*  ALKPHOS 75 49 294* 279*  BILITOT 0.4  --  0.8 0.8  PROT 6.9  --  8.0 5.7*  ALBUMIN 4.4 3.7 3.8 2.7*   Recent Labs    04/29/21 1300 07/16/21 0000 07/24/21 1610 07/25/21 0433  WBC 6.9 7.6 10.8* 9.0  NEUTROABS  --   --  7.5  --   HGB 13.6 13.9 15.1* 12.8  HCT 40.7 42 46.4* 38.6  MCV 92  --  92.4 92.6  PLT 252 227 279 227   Lab Results  Component Value Date   TSH 2.04 07/16/2021   Lab Results  Component Value Date   HGBA1C 10.9 07/16/2021   Lab Results  Component Value Date   CHOL 123 07/25/2021   HDL 30 (L) 07/25/2021   LDLCALC 62 07/25/2021   TRIG 157 (H) 07/25/2021     CHOLHDL 4.1 07/25/2021    Significant Diagnostic Results in last 30 days:  No results found.  Assessment/Plan 1. Overgrown toenails -Consistent provided for toe  nails to be trimmed,  debrided 8, (did not debride great toes bilaterally as it was not needed) without complication/bleeding. Pt tolerated well.  -discussed proper foot care 2. DNR (do not resuscitate) Per chart  - Do not attempt resuscitation (DNR)  

## 2021-11-15 ENCOUNTER — Other Ambulatory Visit: Payer: Self-pay | Admitting: Internal Medicine

## 2021-11-15 DIAGNOSIS — I480 Paroxysmal atrial fibrillation: Secondary | ICD-10-CM

## 2021-11-15 NOTE — Telephone Encounter (Signed)
Prescription refill request for Eliquis received. Indication:Afib  Last office visit: 06/28/21 (End)  Scr: 0.83 (08/08/21) Age: 88 Weight: 78.9kg  Appropriate dose and refill sent to requested pharmacy.

## 2021-12-24 ENCOUNTER — Other Ambulatory Visit: Payer: Self-pay | Admitting: Family Medicine

## 2021-12-24 DIAGNOSIS — R609 Edema, unspecified: Secondary | ICD-10-CM

## 2022-01-01 ENCOUNTER — Non-Acute Institutional Stay (SKILLED_NURSING_FACILITY): Payer: Medicare Other | Admitting: Student

## 2022-01-01 ENCOUNTER — Encounter: Payer: Self-pay | Admitting: Student

## 2022-01-01 DIAGNOSIS — I480 Paroxysmal atrial fibrillation: Secondary | ICD-10-CM

## 2022-01-01 DIAGNOSIS — Z85828 Personal history of other malignant neoplasm of skin: Secondary | ICD-10-CM

## 2022-01-01 DIAGNOSIS — E0959 Drug or chemical induced diabetes mellitus with other circulatory complications: Secondary | ICD-10-CM

## 2022-01-01 DIAGNOSIS — I700 Atherosclerosis of aorta: Secondary | ICD-10-CM

## 2022-01-01 DIAGNOSIS — I10 Essential (primary) hypertension: Secondary | ICD-10-CM

## 2022-01-01 DIAGNOSIS — Z7901 Long term (current) use of anticoagulants: Secondary | ICD-10-CM

## 2022-01-01 DIAGNOSIS — Z66 Do not resuscitate: Secondary | ICD-10-CM

## 2022-01-01 NOTE — Progress Notes (Unsigned)
Location:  [facility] Room Number: 302-L Place of Service:  ALF [phone]) Provider:  Dewayne Shorter, MD  Patient Care Team: Dewayne Shorter, MD as PCP - General (Family Medicine) End, Harrell Gave, MD as PCP - Cardiology (Cardiology) Leandrew Koyanagi, MD as Referring Physician (Ophthalmology) Lauree Chandler, NP as Nurse Practitioner (Geriatric Medicine)  Extended Emergency Contact Information Primary Emergency Contact: Polo Riley Address: [address]          [place], [place] [address] [place] Phone: [phone] Work Phone: [phone] Relation: Daughter  Code Status:  DNR Goals of care: Advanced Directive information    01/01/2022    1:47 PM  Advanced Directives  Does Patient Have a Medical Advance Directive? Yes  Type of Advance Directive Out of facility DNR (pink MOST or yellow form)  Does patient want to make changes to medical advance directive? No - Patient declined  Pre-existing out of facility DNR order (yellow form or pink MOST form) Yellow form placed in chart (order not valid for inpatient use)     Chief Complaint  Patient presents with   Medical Management of Chronic Issues    Routine visit and discuss need for shingrix, covid boosters, and eye exam or post pone if patient refuses. NCIR verified. Vitals and medications are a reflection of [facility] EMR system, [facility]      HPI:  Pt is a 88 y.o. female seen today for medical management of chronic diseases.  Patient is doing well-- she was headed downstairs but stopped to have discussion with me. She has been at [facility] for 1 year. She moved in shortly after right hip fracture (06/11/2019) . She has remained pretty independent. She does not have memory problems. The only reason she does not manage her medications is because she is in [place].   She has three children, her youngest is the most involved. She has diabetes but her sugars are good as long as she is  good- she had chocolate icecream last niht which increased her glucose today.   Denies chest pain, shortness of breath, headaches, chanes in vision, diarrhea, urinary issues, constipation, leg swelling.   Nurse Collier Salina is present for duration of visit.    Past Medical History:  Diagnosis Date   Basal cell carcinoma 03/30/2008   left upper arm/excision   Basal cell carcinoma 03/31/2008   left upper arm, right lower leg   Basal cell carcinoma 01/18/2009   left upper arm   Collagenous colitis    Diabetes mellitus without complication ([place])    Diverticulitis    Heart murmur    at birth, none since   History of echocardiogram    a. 11/2015: echo showing EF of 60-65% with no WMA. Grade 1 DD and mild MR noted.    Hyperlipidemia    Per [facility]   Hypertension    Intertrochanteric fracture of right femur ([place]) 06/11/2019   Lack of coordination    Per [facility] term current use of anticoagulant    Per [facility]   Noninfective gastroenteritis and colitis, unspecified    Per [facility]   PAF (paroxysmal atrial fibrillation) ([place])    a. initially diagnosed in 08/2016 --> started on Eliquis   Squamous cell carcinoma of skin 05/16/2020   Left nasal tip anterior to ala, refer to [facility]   Syncope    a. initially occurring in Fall 2016 b. Hospitalized in 10/2015 and 11/2015 for recurrent episodes.  Past Surgical History:  Procedure Laterality Date   ABDOMINAL HYSTERECTOMY     BACK SURGERY  08/08/2008   Lumbar discectomy   COLONOSCOPY WITH PROPOFOL     EXCISION/RELEASE BURSA HIP Left 05/03/2019   Procedure: HIP ABDUCTOR REPAIR;  Surgeon: [facility], MD;  Location: [facility] ORS;  Service: Orthopedics;  Laterality: Left;   INTRAMEDULLARY (IM) NAIL INTERTROCHANTERIC Right 06/13/2019   Procedure: INTRAMEDULLARY (IM) NAIL INTERTROCHANTRIC;  Surgeon: [facility], MD;  Location: [facility] ORS;  Service: Orthopedics;  Laterality: Right;   NECK SURGERY  1980    Allergies  Allergen  Reactions   Morphine Sulfate Other (See Comments)    BP bottoms out   Penicillins Diarrhea    Has patient had a PCN reaction causing immediate rash, facial/tongue/throat swelling, SOB or lightheadedness with hypotension: Yes Has patient had a PCN reaction causing severe rash involving mucus membranes or skin necrosis: Yes Has patient had a PCN reaction that required hospitalization No Has patient had a PCN reaction occurring within the last 10 years: No If all of the above answers are "NO", then may proceed with Cephalosporin use.    Outpatient Encounter Medications as of 01/01/2022  Medication Sig   acetaminophen (TYLENOL) 325 MG tablet Take 650 mg by mouth 2 (two) times daily. Scheduled and 2 tablets by mouth every 4 hours as needed for pain   bisacodyl (DULCOLAX) 10 MG suppository Place 10 mg rectally as needed for moderate constipation (every 24 hours for constipation).   bismuth subsalicylate (KAOPECTATE) 262 MG/15ML suspension Give 10 cc by mouth as needed for loose stool supervised self-administration   carbamide peroxide (DEBROX) 6.5 % OTIC solution Place 5 drops into both ears as needed (irrigate and remove cerumen as needed).   cetirizine (ZYRTEC) 10 MG tablet Take 10 mg by mouth as needed for allergies (not to exceed 10 days, notify MD if no relief).   Cholecalciferol (VITAMIN D) 2000 UNITS CAPS Take 2,000 Units by mouth daily.    Clobetasol Propionate 0.05 % shampoo Apply 1 Application topically every morning. And wash out   dextromethorphan-guaiFENesin (ROBITUSSIN-DM) 10-100 MG/5ML liquid Give 2 tsp by mouth every 4 hours as needed for coughing. supervised self-administration Notify MD if no relief in 3 days or continued use   diphenhydrAMINE (BENADRYL) 25 MG tablet Take 25 mg by mouth as needed (for an allergic reaction , notify MD of use).   ELIQUIS 5 MG TABS tablet TAKE 1 TABLET BY MOUTH TWICE DAILY   Ensure (ENSURE) Take 237 mLs by mouth as needed (For weight loss or  decreased intake).   fluocinolone (SYNALAR) 0.01 % external solution Apply 1 Application topically at bedtime. Apply to scalp   furosemide (LASIX) 20 MG tablet TAKE 1 TABLET BY MOUTH DAILY ONLY IF NEEDED.   glipiZIDE (GLIPIZIDE XL) 10 MG 24 hr tablet Take 1 tablet (10 mg total) by mouth 2 (two) times daily.   Glucose 15 GM/32ML GEL Take 1 packet by mouth as needed (for low blood sugar).   linagliptin (TRADJENTA) 5 MG TABS tablet Take 1 tablet (5 mg total) by mouth daily.   lisinopril (ZESTRIL) 20 MG tablet TAKE 1 TABLET BY MOUTH DAILY   magnesium hydroxide (MILK OF MAGNESIA) 400 MG/5ML suspension Give 2 Tbsp by mouth as needed for constipation. supervised self-administration daily, Call MD if no relief in 3 days of continued use.   metFORMIN (GLUCOPHAGE-XR) 500 MG 24 hr tablet Take 1 tablet (500 mg total) by mouth 2 (two) times daily.   metoprolol  succinate (TOPROL-XL) 25 MG 24 hr tablet TAKE 1 TABLET BY MOUTH ONCE DAILY   Multiple Vitamin (MULTIVITAMIN WITH MINERALS) TABS tablet Take 1 tablet by mouth daily.   Multiple Vitamins-Minerals (HAIR SKIN AND NAILS FORMULA PO) Take 1 tablet by mouth daily at 6 (six) AM.   nystatin (MYCOSTATIN/NYSTOP) powder Apply 1 Application topically as needed.   ondansetron (ZOFRAN) 4 MG tablet Take 4 mg by mouth every 6 (six) hours as needed for nausea or vomiting. Also give 1 by mouth up to three times a day per [facility] EMR documentation   OXYGEN Inhale 2 L into the lungs as needed (For SOb or dyspnea).   polyethylene glycol (MIRALAX / GLYCOLAX) 17 g packet Take 17 g by mouth every 12 (twelve) hours as needed (for constipation).   UNABLE TO FIND Med Name:  Mylanta Suspension 200-200-20 MG/5ML (Alum&Mag Hydroxide-Simeth), 2 sets of instructions, see below   Give 2 Tbsp by mouth every 4 hours as needed for gas, indigestion, or upset stomach supervised self-administration Notify MD if no relief.  Give 30 ml by mouth every 4 hours as needed for heartburn  unsupervised selfadministration   [DISCONTINUED] Calcium & Magnesium Carbonates (MYLANTA PO) Take by mouth.  200-200-20 mg/5 mLGive 2 Tbsp by mouth every 4 hours as needed for gas, indigestion, or upset stomach supervised self-administration Notify MD if no relief   [DISCONTINUED] Clobetasol Propionate 0.05 % shampoo 2-3 TIMES PER WEEK LATHER ON SCALP, LEAVE ON 20 MINUTES, [place] OUT.   No facility-administered encounter medications on file as of 01/01/2022.    Review of Systems  All other systems reviewed and are negative.   Immunization History  Administered Date(s) Administered   Fluad Quad(high Dose 65+) 12/20/2018   Influenza Split 01/01/2009, 01/02/2010, 01/13/2011, 12/22/2011   Influenza, High Dose Seasonal PF 01/18/2014, 01/01/2015, 11/30/2015, 12/01/2016, 01/11/2018, 12/08/2019   Influenza-Unspecified 01/08/2013, 11/08/2020   Moderna Covid-19 Vaccine Bivalent Booster 59yr & up 11/30/2020   PFIZER Comirnaty(Gray Top)Covid-19 Tri-Sucrose Vaccine 07/27/2020   PFIZER(Purple Top)SARS-COV-2 Vaccination 04/01/2019, 04/22/2019, 12/05/2019   Pfizer Covid-19 Vaccine Bivalent Booster 160yr& up 11/30/2020   Pneumococcal Conjugate-13 10/19/2013   Pneumococcal Polysaccharide-23 01/02/2010   Td 11/02/2003, 01/13/2011   Tdap 01/13/2011, 02/28/2015   Zoster, Live 07/14/2007   Pertinent  Health Maintenance Due  Topic Date Due   OPHTHALMOLOGY EXAM  06/08/2021   INFLUENZA VACCINE  10/08/2021   HEMOGLOBIN A1C  01/16/2022   FOOT EXAM  04/29/2022   DEXA SCAN  Completed      10/19/2020   11:04 AM 07/24/2021    4:09 PM 07/24/2021   10:50 PM 07/25/2021   10:02 AM 07/26/2021    1:00 AM  Fall Risk  Falls in the past year? 0      Was there an injury with Fall? 0      Fall Risk Category Calculator 0      Fall Risk Category Low      Patient Fall Risk Level Low fall risk Moderate fall risk High fall risk High fall risk High fall risk  Patient at Risk for Falls Due to Impaired balance/gait       Fall risk Follow up Falls evaluation completed       Functional Status Survey:    Vitals:   01/01/22 1345  BP: (!) 140/68  Pulse: 68  Resp: 16  Temp: (!) 97.5 F (36.4 C)  SpO2: 99%  Weight: 175 lb (79.4 kg)  Height: '5\' 3"'$  (1.6 m)   Body mass index  is 31 kg/m. Physical Exam Constitutional:      Appearance: Normal appearance.  Cardiovascular:     Rate and Rhythm: Normal rate and regular rhythm.     Pulses: Normal pulses.     Heart sounds: Normal heart sounds.  Pulmonary:     Effort: Pulmonary effort is normal.  Abdominal:     General: Abdomen is flat. Bowel sounds are normal.     Palpations: Abdomen is soft.  Musculoskeletal:        General: No swelling or tenderness.  Skin:    General: Skin is warm and dry.     Capillary Refill: Capillary refill takes 2 to 3 seconds.  Neurological:     Mental Status: She is alert and oriented to person, place, and time. Mental status is at baseline.     Comments: Ambulates well with rollator- slouched posture  Psychiatric:        Mood and Affect: Mood normal.     Labs reviewed: Recent Labs    07/24/21 1610 07/25/21 0433 07/26/21 0334  NA 135 140 138  K 5.0 4.1 4.0  CL 99 113* 107  CO2 '22 24 24  '$ GLUCOSE 361* 86 102*  BUN 35* 26* 17  CREATININE 1.30* 0.83 0.77  CALCIUM 9.4 8.3* 8.4*   Recent Labs    04/29/21 1300 07/16/21 0000 07/24/21 1610 07/26/21 0334  AST 19 12* 172* 89*  ALT 30  --  281* 174*  ALKPHOS 75 49 294* 279*  BILITOT 0.4  --  0.8 0.8  PROT 6.9  --  8.0 5.7*  ALBUMIN 4.4 3.7 3.8 2.7*   Recent Labs    04/29/21 1300 07/16/21 0000 07/24/21 1610 07/25/21 0433  WBC 6.9 7.6 10.8* 9.0  NEUTROABS  --   --  7.5  --   HGB 13.6 13.9 15.1* 12.8  HCT 40.7 42 46.4* 38.6  MCV 92  --  92.4 92.6  PLT 252 227 279 227   Lab Results  Component Value Date   TSH 2.04 07/16/2021   Lab Results  Component Value Date   HGBA1C 10.9 07/16/2021   Lab Results  Component Value Date   CHOL 123 07/25/2021    HDL 30 (L) 07/25/2021   LDLCALC 62 07/25/2021   TRIG 157 (H) 07/25/2021   CHOLHDL 4.1 07/25/2021    Significant Diagnostic Results in last 30 days:  No results found.  Assessment/Plan 1. 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 She is adamantly against starting insulin for treatment of her diabetes.  We will collect A1c today.  Continue glipizide 10 mg twice daily, metformin 500 mg twice daily, Tradjenta 5 mg daily.  Patient's last microalbumin in 2019 was 20.  Will repeat urine microalbumin sample.  2. Paroxysmal atrial fibrillation [facility]) Patient is asymptomatic atrial fibrillation.  Heart rate well controlled with metoprolol succinate 25 mg daily.  We will collect BMP to determine patient's current creatinine level if greater than 1.5 will have dose reduction of apixaban to 2.5 mg twice daily.  At this time we will continue apixaban 5 mg twice daily.  3. Essential hypertension Blood pressure is typically within goal range, however, slightly elevated today. Will continue current regimen with lisinopril 20 mg daily.   4. H/O malignant neoplasm of skin Patient has been followed by dermatology in the past. No obvious lesions on exam today. CTM.  5. Current use of long term anticoagulation Discussed patient's potential need for decreased dose of eliquis-- will decrease dose based on upcoming lab results.  6. Aortic atherosclerosis (HCC) BP well-controlled. Continue current BP and AC treatment.   7. DNR (do not resuscitate) Patient maintains this is her preferred code status.     Family/ staff Communication: nursing - patient stated I did not need to call her children regarding our visit today.   Labs/tests ordered:  CBC, BMP, A1c   Tomasa Rand, MD, [facility] [phone]

## 2022-03-19 ENCOUNTER — Other Ambulatory Visit: Payer: Self-pay | Admitting: Internal Medicine

## 2022-03-19 DIAGNOSIS — I480 Paroxysmal atrial fibrillation: Secondary | ICD-10-CM

## 2022-03-19 NOTE — Telephone Encounter (Signed)
Prescription refill request for Eliquis received. Indication:afib Last office visit:4/23 Scr:0.8 Age: 89 Weight:79.4  kg  Prescription refilled

## 2022-03-19 NOTE — Telephone Encounter (Signed)
Refill request

## 2022-03-29 ENCOUNTER — Other Ambulatory Visit: Payer: Self-pay | Admitting: Family Medicine

## 2022-03-29 DIAGNOSIS — R609 Edema, unspecified: Secondary | ICD-10-CM

## 2022-03-31 ENCOUNTER — Other Ambulatory Visit: Payer: Self-pay | Admitting: Internal Medicine

## 2022-03-31 DIAGNOSIS — R609 Edema, unspecified: Secondary | ICD-10-CM

## 2022-04-01 NOTE — Telephone Encounter (Signed)
Requested medication (s) are due for refill today: expired medication  Requested medication (s) are on the active medication list: yes   Last refill:  03/20/21 #90 1 refills  Future visit scheduled: no   Notes to clinic:  expired medication. Protocol failed. Is patient still patient at practice? Do not see future visit. Do you want to refill Rx?     Requested Prescriptions  Pending Prescriptions Disp Refills   furosemide (LASIX) 20 MG tablet [Pharmacy Med Name: FUROSEMIDE 20 MG TAB] 90 tablet 1    Sig: TAKE 1 TABLET BY MOUTH DAILY ONLY IF NEEDED.     Cardiovascular:  Diuretics - Loop Failed - 03/31/2022  4:49 PM      Failed - K in normal range and within 180 days    Potassium  Date Value Ref Range Status  07/26/2021 4.0 3.5 - 5.1 mmol/L Final         Failed - Ca in normal range and within 180 days    Calcium  Date Value Ref Range Status  07/26/2021 8.4 (L) 8.9 - 10.3 mg/dL Final         Failed - Na in normal range and within 180 days    Sodium  Date Value Ref Range Status  07/26/2021 138 135 - 145 mmol/L Final  07/16/2021 137 137 - 147 Final         Failed - Cr in normal range and within 180 days    Creatinine, Ser  Date Value Ref Range Status  07/26/2021 0.77 0.44 - 1.00 mg/dL Final   Creatinine, POC  Date Value Ref Range Status  09/11/2017 NA mg/dL Final         Failed - Cl in normal range and within 180 days    Chloride  Date Value Ref Range Status  07/26/2021 107 98 - 111 mmol/L Final         Failed - Mg Level in normal range and within 180 days    Magnesium  Date Value Ref Range Status  12/27/2019 1.5 (L) 1.7 - 2.4 mg/dL Final    Comment:    Performed at [facility], [address] [place], [place] [address]         Failed - Last BP in normal range    BP Readings from Last 1 Encounters:  01/01/22 (!) 140/68         Failed - Valid encounter within last 6 months    Recent Outpatient Visits           8 months ago At risk for sepsis  due to urinary tract infection   [facility] Tally Joe T, FNP   8 months ago Type 2 diabetes mellitus with hyperosmolarity without coma, without long-term current use of insulin [facility])   [place] Jerrol Banana., MD   11 months ago Encounter for [employer] annual wellness exam   [place], Dionne Bucy, MD   1 year ago Type 2 diabetes mellitus with other specified complication, without long-term current use of insulin [facility])   [place], Dionne Bucy, MD   1 year ago Type 2 diabetes mellitus with other specified complication, without long-term current use of insulin [facility])   [place], Dionne Bucy, MD       Future Appointments             In 1 month End,  Harrell Gave, [place] at [facility]

## 2022-04-17 ENCOUNTER — Encounter: Payer: Self-pay | Admitting: Internal Medicine

## 2022-04-17 ENCOUNTER — Emergency Department: Payer: Medicare Other

## 2022-04-17 ENCOUNTER — Observation Stay (HOSPITAL_BASED_OUTPATIENT_CLINIC_OR_DEPARTMENT_OTHER)
Admission: EM | Admit: 2022-04-17 | Discharge: 2022-04-18 | Disposition: A | Discharge status: Other Acute Inpatient Hospital | Payer: Medicare Other | Source: Home / Self Care | Attending: Emergency Medicine | Admitting: Emergency Medicine

## 2022-04-17 ENCOUNTER — Other Ambulatory Visit: Payer: Self-pay | Admitting: Internal Medicine

## 2022-04-17 ENCOUNTER — Other Ambulatory Visit: Payer: Self-pay

## 2022-04-17 DIAGNOSIS — B338 Other specified viral diseases: Secondary | ICD-10-CM | POA: Diagnosis not present

## 2022-04-17 DIAGNOSIS — Z7901 Long term (current) use of anticoagulants: Secondary | ICD-10-CM | POA: Insufficient documentation

## 2022-04-17 DIAGNOSIS — Z7984 Long term (current) use of oral hypoglycemic drugs: Secondary | ICD-10-CM | POA: Insufficient documentation

## 2022-04-17 DIAGNOSIS — I10 Essential (primary) hypertension: Secondary | ICD-10-CM | POA: Insufficient documentation

## 2022-04-17 DIAGNOSIS — Z79899 Other long term (current) drug therapy: Secondary | ICD-10-CM | POA: Insufficient documentation

## 2022-04-17 DIAGNOSIS — Z1152 Encounter for screening for COVID-19: Secondary | ICD-10-CM | POA: Insufficient documentation

## 2022-04-17 DIAGNOSIS — R062 Wheezing: Secondary | ICD-10-CM | POA: Diagnosis not present

## 2022-04-17 DIAGNOSIS — R0902 Hypoxemia: Secondary | ICD-10-CM

## 2022-04-17 DIAGNOSIS — E785 Hyperlipidemia, unspecified: Secondary | ICD-10-CM | POA: Diagnosis present

## 2022-04-17 DIAGNOSIS — E1159 Type 2 diabetes mellitus with other circulatory complications: Secondary | ICD-10-CM | POA: Diagnosis present

## 2022-04-17 DIAGNOSIS — Z85828 Personal history of other malignant neoplasm of skin: Secondary | ICD-10-CM | POA: Insufficient documentation

## 2022-04-17 DIAGNOSIS — I480 Paroxysmal atrial fibrillation: Secondary | ICD-10-CM | POA: Diagnosis present

## 2022-04-17 DIAGNOSIS — J210 Acute bronchiolitis due to respiratory syncytial virus: Secondary | ICD-10-CM

## 2022-04-17 DIAGNOSIS — E871 Hypo-osmolality and hyponatremia: Secondary | ICD-10-CM | POA: Insufficient documentation

## 2022-04-17 DIAGNOSIS — Z66 Do not resuscitate: Secondary | ICD-10-CM | POA: Diagnosis not present

## 2022-04-17 DIAGNOSIS — J441 Chronic obstructive pulmonary disease with (acute) exacerbation: Secondary | ICD-10-CM | POA: Diagnosis not present

## 2022-04-17 DIAGNOSIS — R531 Weakness: Secondary | ICD-10-CM

## 2022-04-17 DIAGNOSIS — B974 Respiratory syncytial virus as the cause of diseases classified elsewhere: Secondary | ICD-10-CM | POA: Insufficient documentation

## 2022-04-17 DIAGNOSIS — R0602 Shortness of breath: Secondary | ICD-10-CM | POA: Insufficient documentation

## 2022-04-17 DIAGNOSIS — Z87891 Personal history of nicotine dependence: Secondary | ICD-10-CM | POA: Insufficient documentation

## 2022-04-17 LAB — COMPREHENSIVE METABOLIC PANEL
ALT: 26 U/L (ref 0–44)
AST: 24 U/L (ref 15–41)
Albumin: 4.2 g/dL (ref 3.5–5.0)
Alkaline Phosphatase: 57 U/L (ref 38–126)
Anion gap: 13 (ref 5–15)
BUN: 18 mg/dL (ref 8–23)
CO2: 21 mmol/L — ABNORMAL LOW (ref 22–32)
Calcium: 9.1 mg/dL (ref 8.9–10.3)
Chloride: 98 mmol/L (ref 98–111)
Creatinine, Ser: 0.8 mg/dL (ref 0.44–1.00)
GFR, Estimated: >60 mL/min (ref >60)
Glucose, Bld: 263 mg/dL — ABNORMAL HIGH (ref 70–99)
Potassium: 3.8 mmol/L (ref 3.5–5.1)
Sodium: 132 mmol/L — ABNORMAL LOW (ref 135–145)
Total Bilirubin: 1 mg/dL (ref 0.3–1.2)
Total Protein: 7.7 g/dL (ref 6.5–8.1)

## 2022-04-17 LAB — LACTIC ACID, PLASMA
Lactic Acid, Venous: 1.4 mmol/L (ref 0.5–1.9)
Lactic Acid, Venous: 1.2 mmol/L (ref 0.5–1.9)

## 2022-04-17 LAB — CBC
HCT: 41.3 % (ref 36.0–46.0)
Hemoglobin: 13.7 g/dL (ref 12.0–15.0)
MCH: 29.6 pg (ref 26.0–34.0)
MCHC: 33.2 g/dL (ref 30.0–36.0)
MCV: 89.2 fL (ref 80.0–100.0)
Platelets: 207 10*3/uL (ref 150–400)
RBC: 4.63 MIL/uL (ref 3.87–5.11)
RDW: 11.9 % (ref 11.5–15.5)
WBC: 8 10*3/uL (ref 4.0–10.5)
nRBC: 0 % (ref 0.0–0.2)

## 2022-04-17 LAB — RESP PANEL BY RT-PCR (RSV, FLU A&B, COVID)  RVPGX2
Influenza A by PCR: NEGATIVE
Influenza B by PCR: NEGATIVE
Resp Syncytial Virus by PCR: POSITIVE — AB
SARS Coronavirus 2 by RT PCR: NEGATIVE

## 2022-04-17 MED ORDER — APIXABAN 5 MG PO TABS
5.0000 mg | ORAL_TABLET | Freq: Two times a day (BID) | ORAL | Status: DC
  Administered 2022-04-18 (×2): 5 mg via ORAL
  Filled 2022-04-17 (×2): qty 1

## 2022-04-17 MED ORDER — INSULIN ASPART 100 UNIT/ML IJ SOLN: 0.0000 [IU] | Freq: Three times a day (TID) | INTRAMUSCULAR | Status: DC

## 2022-04-17 MED ORDER — METOPROLOL SUCCINATE ER 25 MG PO TB24
25.0000 mg | ORAL_TABLET | Freq: Every day | ORAL | Status: DC
  Administered 2022-04-18: 25 mg via ORAL
  Filled 2022-04-17: qty 1

## 2022-04-17 MED ORDER — SODIUM CHLORIDE 0.9 % IV BOLUS
1000.0000 mL | Freq: Once | INTRAVENOUS | Status: AC
  Administered 2022-04-17: 1000 mL via INTRAVENOUS

## 2022-04-17 MED ORDER — IPRATROPIUM-ALBUTEROL 0.5-2.5 (3) MG/3ML IN SOLN
3.0000 mL | Freq: Once | RESPIRATORY_TRACT | Status: AC
  Administered 2022-04-17: 3 mL via RESPIRATORY_TRACT
  Filled 2022-04-17: qty 3

## 2022-04-17 MED ORDER — LINAGLIPTIN 5 MG PO TABS
5.0000 mg | ORAL_TABLET | Freq: Every day | ORAL | Status: DC
  Administered 2022-04-18: 5 mg via ORAL
  Filled 2022-04-17: qty 1

## 2022-04-17 MED ORDER — ONDANSETRON HCL 4 MG/2ML IJ SOLN
4.0000 mg | Freq: Once | INTRAMUSCULAR | Status: AC
  Administered 2022-04-17: 4 mg via INTRAVENOUS
  Filled 2022-04-17: qty 2

## 2022-04-17 MED ORDER — ACETAMINOPHEN 500 MG PO TABS
1000.0000 mg | ORAL_TABLET | Freq: Once | ORAL | Status: AC
  Administered 2022-04-17: 1000 mg via ORAL
  Filled 2022-04-17: qty 2

## 2022-04-17 MED ORDER — LISINOPRIL 20 MG PO TABS
20.0000 mg | ORAL_TABLET | Freq: Every day | ORAL | Status: DC
  Administered 2022-04-18: 20 mg via ORAL
  Filled 2022-04-17: qty 1

## 2022-04-17 MED ORDER — INSULIN ASPART 100 UNIT/ML IJ SOLN: 0.0000 [IU] | Freq: Every day | INTRAMUSCULAR | Status: DC

## 2022-04-17 MED ORDER — DEXAMETHASONE 6 MG PO TABS: 6.0000 mg | ORAL_TABLET | Freq: Every day | ORAL | Status: DC

## 2022-04-17 MED ORDER — ACETAMINOPHEN 650 MG RE SUPP: 650.0000 mg | Freq: Four times a day (QID) | RECTAL | Status: DC | PRN

## 2022-04-17 MED ORDER — GLIPIZIDE ER 10 MG PO TB24
10.0000 mg | ORAL_TABLET | Freq: Two times a day (BID) | ORAL | Status: DC
  Administered 2022-04-18: 10 mg via ORAL
  Filled 2022-04-17: qty 1

## 2022-04-17 MED ORDER — ACETAMINOPHEN 325 MG PO TABS: 650.0000 mg | ORAL_TABLET | Freq: Four times a day (QID) | ORAL | Status: DC | PRN

## 2022-04-17 NOTE — Assessment & Plan Note (Signed)
Verified DNR with pt's dtr and reviewed DNR form.

## 2022-04-17 NOTE — Assessment & Plan Note (Signed)
On Eliquis 5 mg bid for PAF.

## 2022-04-17 NOTE — Assessment & Plan Note (Signed)
Observation med/surg bed. Prn supplemental O2. With O2 sats at 90% at rest, she is likely going to be hypoxic with activity. Will need PT to ambulate on RA and with supplemental O2 if needed. Dtr states pt uses rolling walker at ALF([facility]). Continue with duonebs and decadron. Dtr states pt wheezing everyday but does not have hx of COPD or asthma.

## 2022-04-17 NOTE — H&P (Addendum)
History and Physical    [name] ZOX:[id number] DOB: 1931-09-19 DOA: 04/17/2022  DOS: the patient was seen and examined on 04/17/2022  PCP: Dewayne Shorter, MD   Patient coming from: [place]  I have personally briefly reviewed patient's old medical records in [place]  CC: SOB, +RSV HPI: 89 yo WF with hx of HTN, DM2, PAF on eliquis, presents the ER today with a 2-day history of shortness of breath, wheezing.  Patient was tested for RSV yesterday at the assisted living facility [facility]).  She has been more short of breath the last 24 hours.  Brought to ER by family.  No recent diarrhea.  No fevers.  Daughter states the patient was not immunized for RSV this season.  On arrival temp 100.4 heart rate 83 blood pressure 177/76.  O2 saturations drifted down to 90% on room air.  Labs showed RSV positive  Lactic acid 1.4  Sodium 132, potassium 3.8, BUN of 18, creatinine 0.8, glucose of 263  White count 8.0, hemoglobin 13.7, platelets of 207  Chest x-ray demonstrates chronically elevated left diaphragm.  Atelectasis left base.  No pneumonia.  [facility] hospitalist contacted for admission.  Daughter states that patient uses a rollator at assisted living for ambulation. Does not use supplemental O2 at ALF   ED Course: RSV positive. 90% O2 sats  Review of Systems:  Review of Systems  Constitutional:  Positive for malaise/fatigue. Negative for fever.  HENT: Negative.    Eyes: Negative.   Respiratory:  Positive for cough, shortness of breath and wheezing.   Gastrointestinal: Negative.  Negative for abdominal pain, diarrhea and vomiting.  Genitourinary: Negative.   Musculoskeletal: Negative.   Skin: Negative.   Neurological:  Positive for weakness.  Endo/Heme/Allergies: Negative.   Psychiatric/Behavioral: Negative.    All other systems reviewed and are negative.   Past Medical History:  Diagnosis Date   Acute pancreatitis 07/24/2021   Basal cell carcinoma  03/30/2008   left upper arm/excision   Basal cell carcinoma 03/31/2008   left upper arm, right lower leg   Basal cell carcinoma 01/18/2009   left upper arm   Collagenous colitis    Diabetes mellitus without complication ([place])    Diverticulitis    Elevated LFTs 07/25/2021   Heart murmur    at birth, none since   History of echocardiogram    a. 11/2015: echo showing EF of 60-65% with no WMA. Grade 1 DD and mild MR noted.    Hyperlipidemia    Per [facility]   Hypertension    Intertrochanteric fracture of right femur ([place]) 06/11/2019   Lack of coordination    Per [facility] term current use of anticoagulant    Per [facility]   Noninfective gastroenteritis and colitis, unspecified    Per [facility]   PAF (paroxysmal atrial fibrillation) ([place])    a. initially diagnosed in 08/2016 --> started on Eliquis   Squamous cell carcinoma of skin 05/16/2020   Left nasal tip anterior to ala, refer to [facility]   Syncope    a. initially occurring in Fall 2016 b. Hospitalized in 10/2015 and 11/2015 for recurrent episodes.     Past Surgical History:  Procedure Laterality Date   ABDOMINAL HYSTERECTOMY     BACK SURGERY  08/08/2008   Lumbar discectomy   COLONOSCOPY WITH PROPOFOL     EXCISION/RELEASE BURSA HIP Left 05/03/2019   Procedure: HIP ABDUCTOR REPAIR;  Surgeon: Hessie Knows, MD;  Location: [facility]  ORS;  Service: Orthopedics;  Laterality: Left;   INTRAMEDULLARY (IM) NAIL INTERTROCHANTERIC Right 06/13/2019   Procedure: INTRAMEDULLARY (IM) NAIL INTERTROCHANTRIC;  Surgeon: Hessie Knows, MD;  Location: [facility] ORS;  Service: Orthopedics;  Laterality: Right;   [place]     reports that she quit smoking about 44 years ago. Her smoking use included cigarettes. She has never used smokeless tobacco. She reports that she does not currently use alcohol. She reports that she does not use drugs.  Allergies  Allergen Reactions   Morphine Sulfate Other (See Comments)    BP bottoms out    Penicillins Diarrhea    Has patient had a PCN reaction causing immediate rash, facial/tongue/throat swelling, SOB or lightheadedness with hypotension: Yes Has patient had a PCN reaction causing severe rash involving mucus membranes or skin necrosis: Yes Has patient had a PCN reaction that required hospitalization No Has patient had a PCN reaction occurring within the last 10 years: No If all of the above answers are "NO", then may proceed with Cephalosporin use.    Family History  Problem Relation Age of Onset   Hypertension Mother    Diabetes Mother    Lung cancer Father     Prior to Admission medications   Medication Sig Start Date End Date Taking? Authorizing Provider  acetaminophen (TYLENOL) 325 MG tablet Take 650 mg by mouth 2 (two) times daily. Scheduled and 2 tablets by mouth every 4 hours as needed for pain    [provider]  bisacodyl (DULCOLAX) 10 MG suppository Place 10 mg rectally as needed for moderate constipation (every 24 hours for constipation).    [provider]  bismuth subsalicylate (KAOPECTATE) 262 MG/15ML suspension Give 10 cc by mouth as needed for loose stool supervised self-administration    [provider]  carbamide peroxide (DEBROX) 6.5 % OTIC solution Place 5 drops into both ears as needed (irrigate and remove cerumen as needed).    [provider]  cetirizine (ZYRTEC) 10 MG tablet Take 10 mg by mouth as needed for allergies (not to exceed 10 days, notify MD if no relief).    [provider]  Cholecalciferol (VITAMIN D) 2000 UNITS CAPS Take 2,000 Units by mouth daily.     [provider]  Clobetasol Propionate 0.05 % shampoo Apply 1 Application topically every morning. And wash out    [provider]  dextromethorphan-guaiFENesin (ROBITUSSIN-DM) 10-100 MG/5ML liquid Give 2 tsp by mouth every 4 hours as needed for coughing. supervised self-administration Notify MD if no relief in 3 days or  continued use    [provider]  diphenhydrAMINE (BENADRYL) 25 MG tablet Take 25 mg by mouth as needed (for an allergic reaction , notify MD of use).    [provider]  ELIQUIS 5 MG TABS tablet TAKE 1 TABLET BY MOUTH TWICE DAILY 03/19/22   End, Harrell Gave, MD  Ensure (ENSURE) Take 237 mLs by mouth as needed (For weight loss or decreased intake).    [provider]  fluocinolone (SYNALAR) 0.01 % external solution Apply 1 Application topically at bedtime. Apply to scalp    [provider]  furosemide (LASIX) 20 MG tablet TAKE 1 TABLET BY MOUTH DAILY ONLY IF NEEDED. 03/20/21   Bac[place]upo, Dionne Bucy, MD  glipiZIDE (GLIPIZIDE XL) 10 MG 24 hr tablet Take 1 tablet (10 mg total) by mouth 2 (two) times daily. 09/05/21   Jearld Fenton, NP  Glucose 15 GM/32ML GEL Take 1 packet by mouth as  needed (for low blood sugar).    [provider]  linagliptin (TRADJENTA) 5 MG TABS tablet Take 1 tablet (5 mg total) by mouth daily. 09/17/21   Venia Carbon, MD  lisinopril (ZESTRIL) 20 MG tablet TAKE 1 TABLET BY MOUTH DAILY 10/14/21   End, Harrell Gave, MD  magnesium hydroxide (MILK OF MAGNESIA) 400 MG/5ML suspension Give 2 Tbsp by mouth as needed for constipation. supervised self-administration daily, Call MD if no relief in 3 days of continued use.    [provider]  metFORMIN (GLUCOPHAGE-XR) 500 MG 24 hr tablet Take 1 tablet (500 mg total) by mouth 2 (two) times daily. 09/05/21   Jearld Fenton, NP  metoprolol succinate (TOPROL-XL) 25 MG 24 hr tablet TAKE 1 TABLET BY MOUTH ONCE DAILY 04/17/22   End, Harrell Gave, MD  Multiple Vitamin (MULTIVITAMIN WITH MINERALS) TABS tablet Take 1 tablet by mouth daily.    [provider]  Multiple Vitamins-Minerals (HAIR SKIN AND NAILS FORMULA PO) Take 1 tablet by mouth daily at 6 (six) AM.    [provider]  nystatin (MYCOSTATIN/NYSTOP) powder Apply 1 Application topically as needed.    [provider]   ondansetron (ZOFRAN) 4 MG tablet Take 4 mg by mouth every 6 (six) hours as needed for nausea or vomiting. Also give 1 by mouth up to three times a day per [facility] EMR documentation    [provider]  OXYGEN Inhale 2 L into the lungs as needed (For SOb or dyspnea).    [provider]  polyethylene glycol (MIRALAX / GLYCOLAX) 17 g packet Take 17 g by mouth every 12 (twelve) hours as needed (for constipation).    [provider]  UNABLE TO FIND Med Name:  Mylanta Suspension 200-200-20 MG/5ML (Alum&Mag Hydroxide-Simeth), 2 sets of instructions, see below   Give 2 Tbsp by mouth every 4 hours as needed for gas, indigestion, or upset stomach supervised self-administration Notify MD if no relief.  Give 30 ml by mouth every 4 hours as needed for heartburn unsupervised selfadministration    [provider]    Physical Exam: Vitals:   04/17/22 2110 04/17/22 2130 04/17/22 2200 04/17/22 2230  BP: (!) 165/78 (!) 136/52 (!) 156/71 (!) 154/64  Pulse: 86 90 85 73  Resp: (!) 27 (!) 26 (!) 25 (!) 23  Temp:      TempSrc:      SpO2: 90% 92% 98% 97%  Weight:      Height:        Physical Exam Vitals and nursing note reviewed.  Constitutional:      General: She is not in acute distress.    Appearance: Normal appearance. She is not ill-appearing, toxic-appearing or diaphoretic.  HENT:     Head: Normocephalic and atraumatic.     Nose: Nose normal.  Eyes:     General: No scleral icterus. Cardiovascular:     Rate and Rhythm: Normal rate and regular rhythm.     Pulses: Normal pulses.  Pulmonary:     Breath sounds: Wheezing present.     Comments: Coarse expiratory wheezing Abdominal:     General: Abdomen is protuberant. Bowel sounds are normal. There is no distension.     Palpations: Abdomen is soft.     Tenderness: There is no abdominal tenderness. There is no guarding or rebound.  Musculoskeletal:     Right lower leg: No edema.     Left lower leg: No  edema.  Skin:    General: Skin  is warm and dry.     Capillary Refill: Capillary refill takes less than 2 seconds.     Comments: Well healed scar on right anterior pretibial area. Stellate pattern  Neurological:     General: No focal deficit present.     Mental Status: She is alert and oriented to person, place, and time.      Labs on Admission: I have personally reviewed following labs and imaging studies  CBC: Recent Labs  Lab 04/17/22 2020  WBC 8.0  HGB 13.7  HCT 41.3  MCV 89.2  PLT 314   Basic Metabolic Panel: Recent Labs  Lab 04/17/22 2020  NA 132*  K 3.8  CL 98  CO2 21*  GLUCOSE 263*  BUN 18  CREATININE 0.80  CALCIUM 9.1   GFR: Estimated Creatinine Clearance: 45.4 mL/min (by C-G formula based on SCr of 0.8 mg/dL). Liver Function Tests: Recent Labs  Lab 04/17/22 2020  AST 24  ALT 26  ALKPHOS 57  BILITOT 1.0  PROT 7.7  ALBUMIN 4.2   No results for input(s): "LIPASE", "AMYLASE" in the last 168 hours. No results for input(s): "AMMONIA" in the last 168 hours. Coagulation Profile: No results for input(s): "INR", "PROTIME" in the last 168 hours. Cardiac Enzymes: No results for input(s): "CKTOTAL", "CKMB", "CKMBINDEX", "TROPONINI", "TROPONINIHS" in the last 168 hours. BNP (last 3 results) No results for input(s): "PROBNP" in the last 8760 hours. HbA1C: No results for input(s): "HGBA1C" in the last 72 hours. CBG: No results for input(s): "GLUCAP" in the last 168 hours. Lipid Profile: No results for input(s): "CHOL", "HDL", "LDLCALC", "TRIG", "CHOLHDL", "LDLDIRECT" in the last 72 hours. Thyroid Function Tests: No results for input(s): "TSH", "T4TOTAL", "FREET4", "T3FREE", "THYROIDAB" in the last 72 hours. Anemia Panel: No results for input(s): "VITAMINB12", "FOLATE", "FERRITIN", "TIBC", "IRON", "RETICCTPCT" in the last 72 hours. Urine analysis:    Component Value Date/Time   COLORURINE YELLOW (A) 07/24/2021 1826   APPEARANCEUR CLEAR (A) 07/24/2021  1826   APPEARANCEUR Clear 11/21/2015 0919   LABSPEC 1.025 07/24/2021 1826   PHURINE 5.0 07/24/2021 1826   GLUCOSEU >=500 (A) 07/24/2021 1826   HGBUR NEGATIVE 07/24/2021 1826   BILIRUBINUR NEGATIVE 07/24/2021 1826   BILIRUBINUR neg 12/05/2019 1206   BILIRUBINUR Negative 11/21/2015 0919   KETONESUR 20 (A) 07/24/2021 1826   PROTEINUR NEGATIVE 07/24/2021 1826   UROBILINOGEN 0.2 12/05/2019 1206   NITRITE NEGATIVE 07/24/2021 1826   LEUKOCYTESUR NEGATIVE 07/24/2021 1826    Radiological Exams on Admission: I have personally reviewed images DG Chest Portable 1 View  Result Date: 04/17/2022 CLINICAL DATA:  Shortness of breath EXAM: PORTABLE CHEST 1 VIEW COMPARISON:  07/24/2021 FINDINGS: Chronic elevation left diaphragm. Mild hypoventilatory changes with atelectasis at the left base. No consolidation or effusion. Stable cardiomediastinal silhouette with aortic atherosclerosis IMPRESSION: No active disease. Chronic elevation left diaphragm with atelectasis at the left base. Electronically Signed   By: Donavan Foil M.D.   On: 04/17/2022 20:53    EKG: My personal interpretation of EKG shows: NSR    Assessment/Plan Principal Problem:   RSV infection Active Problems:   Paroxysmal atrial fibrillation (HCC)   Current use of long term anticoagulation   DNR (do not resuscitate)   Dyslipidemia   Essential hypertension   Diabetes mellitus with circulatory complication, without long-term current use of insulin (HCC)   Hyponatremia    Assessment and Plan: * RSV infection Observation med/surg bed. Prn supplemental O2. With O2 sats at 90% at rest, she is likely going  to be hypoxic with activity. Will need PT to ambulate on RA and with supplemental O2 if needed. Dtr states pt uses rolling walker at ALF([facility] Continue with duonebs and decadron. Dtr states pt wheezing everyday but does not have hx of COPD or asthma.  Essential hypertension Continue lisinopril 20 mg and toprolx- 25  mg  Dyslipidemia Stable.  DNR (do not resuscitate) Verified DNR with pt's dtr and reviewed DNR form.    Current use of long term anticoagulation On Eliquis 5 mg bid for PAF.  Paroxysmal atrial fibrillation (HCC) On toprol-xl 25 mg and eliquis 5 mg bid.  Hyponatremia Likely due to hyperglycemia. Sodium correct to 135 accounting for serum glucose of 268.  Check TSH.  Diabetes mellitus with circulatory complication, without long-term current use of insulin (HCC) Check A1c. Continue glipizide. Add SSI while on decadron.   DVT prophylaxis: Eliquis Code Status: DNR/DNI(Do NOT Intubate). Verified with pt's dtr Emeline Gins Family Communication: discussed with dtr Emeline Gins  Disposition Plan: return to ALF  Consults called: none  Admission status: Observation, Med-Surg   Kristopher Oppenheim, DO [facility] 04/17/2022, 10:51 PM

## 2022-04-17 NOTE — Assessment & Plan Note (Signed)
Continue lisinopril 20 mg and toprolx- 25 mg

## 2022-04-17 NOTE — ED Provider Notes (Signed)
[facility] Provider Note    Event Date/Time   First MD Initiated Contact with Patient 04/17/22 2013     (approximate)  History   Chief Complaint: Shortness of Breath  HPI  [name] is a 89 y.o. female with a past medical history of diabetes, hypertension, hyperlipidemia, paroxysmal atrial fibrillation on Eliquis, presents to the emergency department for generalized weakness shortness of breath nausea.  According to the daughter yesterday patient began feeling very weak with some shortness of breath.  Saw her doctor and tested positive for RSV.  Daughter states today the patient's symptoms have continued to worsen, she is feeling very weak fatigued difficulty getting around due to weakness worsening shortness of breath not eating or drinking today either.  Patient found to be febrile to 100.4 in the emergency department mildly tachypneic at 24 breaths/min O2 saturation varying between 90 and 93% during my evaluation with a good waveform.  No baseline O2 requirement.  Physical Exam   Triage Vital Signs: ED Triage Vitals  Enc Vitals Group     BP 04/17/22 2008 (!) 177/76     Pulse Rate 04/17/22 2008 83     Resp 04/17/22 2008 (!) 24     Temp 04/17/22 2008 (!) 100.4 F (38 C)     Temp Source 04/17/22 2008 Oral     SpO2 04/17/22 2008 95 %     Weight 04/17/22 2009 175 lb (79.4 kg)     Height 04/17/22 2009 5' (1.524 m)     Head Circumference --      Peak Flow --      Pain Score 04/17/22 2009 0     Pain Loc --      Pain Edu? --      Excl. in [place]? --     Most recent vital signs: Vitals:   04/17/22 2008  BP: (!) 177/76  Pulse: 83  Resp: (!) 24  Temp: (!) 100.4 F (38 C)  SpO2: 95%    General: Awake, no distress but fatigued appearing. CV:  Good peripheral perfusion.  Regular rate and rhythm Resp:  Mild tachypnea with mild expiratory wheezes bilaterally. Abd:  No distention.  Soft, nontender.  No rebound or guarding.  ED Results / Procedures /  Treatments   EKG  EKG viewed and interpreted by myself shows a sinus rhythm at 83 bpm with a narrow QRS, normal axis, normal intervals, nonspecific ST changes.  No ST elevation.  RADIOLOGY  I reviewed and interpreted chest x-ray images.  Patient has looks to be inflammatory changes but no focal consolidation. Radiology has read the x-ray as no active disease.   MEDICATIONS ORDERED IN ED: Medications  sodium chloride 0.9 % bolus 1,000 mL (has no administration in time range)  ondansetron (ZOFRAN) injection 4 mg (has no administration in time range)  acetaminophen (TYLENOL) tablet 1,000 mg (has no administration in time range)     IMPRESSION / MDM / ASSESSMENT AND PLAN / ED COURSE  I reviewed the triage vital signs and the nursing notes.  Patient's presentation is most consistent with acute presentation with potential threat to life or bodily function.  Patient presents to the emergency department for worsening shortness of breath weakness not eating or drinking after testing positive for RSV yesterday.  Daughter states symptoms started yesterday but has progressively worsened yesterday and today.  Patient does appear fatigued but not in any distress.  Does have mild expiratory wheezes bilaterally we will give a breathing  treatment.  We will obtain a chest x-ray.  Per daughter patient tested positive for RSV yesterday which could very well explain the patient's symptoms today.  She is febrile to 100.4 we will dose Tylenol as well as IV fluids.  We will check labs including blood cultures and a lactate and continue to closely monitor.  Patient's oxygen level is satting between 90 and 93% currently.  We will reassess after nebulizer treatment.  Patient's workup shows a normal CBC with a normal white blood cell count, reassuring chemistry with slight hyperglycemia.  Patient's RSV is positive likely explaining the patient's symptoms today.  Oxygen level continues to desat at times to 88 placed  on 2 L nasal cannula and is satting well.  Given the patient's weakness and now intermittent hypoxia we will admit to the hospital service for continued workup and treatment.  Patient daughter agreeable to this plan of care.  FINAL CLINICAL IMPRESSION(S) / ED DIAGNOSES   Dyspnea Weakness RSV   Note:  This document was prepared using Dragon voice recognition software and may include unintentional dictation errors.   Harvest Dark, MD 04/17/22 2158

## 2022-04-17 NOTE — Assessment & Plan Note (Signed)
Check A1c. Continue glipizide. Add SSI while on decadron.

## 2022-04-17 NOTE — ED Triage Notes (Addendum)
Pt comes from [facility] via [place] c/o SOB. Pt states she tested positive for RSV yesterday. Pt has been having increasing shortness of breath, decreased appetite, and weakness. Pt audibly wheezing in triage. Pt denies any CP

## 2022-04-17 NOTE — Subjective & Objective (Addendum)
CC: SOB, +RSV HPI: 89 yo WF with hx of HTN, DM2, PAF on eliquis, presents the ER today with a 2-day history of shortness of breath, wheezing.  Patient was tested for RSV yesterday at the assisted living facility [facility]).  She has been more short of breath the last 24 hours.  Brought to ER by family.  No recent diarrhea.  No fevers.  Daughter states the patient was not immunized for RSV this season.  On arrival temp 100.4 heart rate 83 blood pressure 177/76.  O2 saturations drifted down to 90% on room air.  Labs showed RSV positive  Lactic acid 1.4  Sodium 132, potassium 3.8, BUN of 18, creatinine 0.8, glucose of 263  White count 8.0, hemoglobin 13.7, platelets of 207  Chest x-ray demonstrates chronically elevated left diaphragm.  Atelectasis left base.  No pneumonia.  [facility] hospital[facility] contacted for admission.  Daughter states that patient uses a rollator at assisted living for ambulation. Does not use supplemental O2 at ALF

## 2022-04-17 NOTE — Assessment & Plan Note (Signed)
On toprol-xl 25 mg and eliquis 5 mg bid.

## 2022-04-17 NOTE — Assessment & Plan Note (Addendum)
Likely due to hyperglycemia. Sodium correct to 135 accounting for serum glucose of 268.  Check TSH.

## 2022-04-17 NOTE — Assessment & Plan Note (Signed)
Stable. 

## 2022-04-17 NOTE — ED Notes (Signed)
SPO2 92%, Pt placed on 2L [place].

## 2022-04-18 ENCOUNTER — Encounter: Payer: Self-pay | Admitting: Internal Medicine

## 2022-04-18 ENCOUNTER — Other Ambulatory Visit: Payer: Self-pay | Admitting: Student

## 2022-04-18 DIAGNOSIS — B338 Other specified viral diseases: Secondary | ICD-10-CM | POA: Diagnosis not present

## 2022-04-18 DIAGNOSIS — Z7901 Long term (current) use of anticoagulants: Secondary | ICD-10-CM | POA: Diagnosis not present

## 2022-04-18 DIAGNOSIS — Z66 Do not resuscitate: Secondary | ICD-10-CM | POA: Diagnosis not present

## 2022-04-18 DIAGNOSIS — E871 Hypo-osmolality and hyponatremia: Secondary | ICD-10-CM

## 2022-04-18 DIAGNOSIS — E785 Hyperlipidemia, unspecified: Secondary | ICD-10-CM | POA: Diagnosis not present

## 2022-04-18 LAB — CBC WITH DIFFERENTIAL/PLATELET
Abs Immature Granulocytes: 0.05 10*3/uL (ref 0.00–0.07)
Basophils Absolute: 0 10*3/uL (ref 0.0–0.1)
Basophils Relative: 0 %
Eosinophils Absolute: 0 10*3/uL (ref 0.0–0.5)
Eosinophils Relative: 0 %
HCT: 40 % (ref 36.0–46.0)
Hemoglobin: 13.1 g/dL (ref 12.0–15.0)
Immature Granulocytes: 1 %
Lymphocytes Relative: 16 %
Lymphs Abs: 1 10*3/uL (ref 0.7–4.0)
MCH: 29.4 pg (ref 26.0–34.0)
MCHC: 32.8 g/dL (ref 30.0–36.0)
MCV: 89.7 fL (ref 80.0–100.0)
Monocytes Absolute: 0.2 10*3/uL (ref 0.1–1.0)
Monocytes Relative: 3 %
Neutro Abs: 5.1 10*3/uL (ref 1.7–7.7)
Neutrophils Relative %: 80 %
Platelets: 191 10*3/uL (ref 150–400)
RBC: 4.46 MIL/uL (ref 3.87–5.11)
RDW: 11.9 % (ref 11.5–15.5)
WBC: 6.4 10*3/uL (ref 4.0–10.5)
nRBC: 0 % (ref 0.0–0.2)

## 2022-04-18 LAB — COMPREHENSIVE METABOLIC PANEL
ALT: 25 U/L (ref 0–44)
AST: 21 U/L (ref 15–41)
Albumin: 3.4 g/dL — ABNORMAL LOW (ref 3.5–5.0)
Alkaline Phosphatase: 48 U/L (ref 38–126)
Anion gap: 8 (ref 5–15)
BUN: 24 mg/dL — ABNORMAL HIGH (ref 8–23)
CO2: 23 mmol/L (ref 22–32)
Calcium: 8.3 mg/dL — ABNORMAL LOW (ref 8.9–10.3)
Chloride: 104 mmol/L (ref 98–111)
Creatinine, Ser: 0.92 mg/dL (ref 0.44–1.00)
GFR, Estimated: 60 mL/min — ABNORMAL LOW (ref >60)
Glucose, Bld: 307 mg/dL — ABNORMAL HIGH (ref 70–99)
Potassium: 4.1 mmol/L (ref 3.5–5.1)
Sodium: 135 mmol/L (ref 135–145)
Total Bilirubin: 0.8 mg/dL (ref 0.3–1.2)
Total Protein: 6.7 g/dL (ref 6.5–8.1)

## 2022-04-18 LAB — HEMOGLOBIN A1C
Hgb A1c MFr Bld: 8.3 % — ABNORMAL HIGH (ref 4.8–5.6)
Mean Plasma Glucose: 191.51 mg/dL

## 2022-04-18 LAB — CBG MONITORING, ED: Glucose-Capillary: 265 mg/dL — ABNORMAL HIGH (ref 70–99)

## 2022-04-18 LAB — TSH: TSH: 2.284 u[IU]/mL (ref 0.350–4.500)

## 2022-04-18 LAB — GLUCOSE, CAPILLARY
Glucose-Capillary: 331 mg/dL — ABNORMAL HIGH (ref 70–99)
Glucose-Capillary: 370 mg/dL — ABNORMAL HIGH (ref 70–99)

## 2022-04-18 MED ORDER — METFORMIN HCL ER 500 MG PO TB24
500.0000 mg | ORAL_TABLET | Freq: Two times a day (BID) | ORAL | Status: DC
  Administered 2022-04-18: 500 mg via ORAL
  Filled 2022-04-18 (×2): qty 1

## 2022-04-18 MED ORDER — IPRATROPIUM-ALBUTEROL 0.5-2.5 (3) MG/3ML IN SOLN: 3.0000 mL | RESPIRATORY_TRACT | Status: DC | PRN

## 2022-04-18 MED ORDER — ALBUTEROL SULFATE HFA 108 (90 BASE) MCG/ACT IN AERS: 2.0000 | INHALATION_SPRAY | Freq: Four times a day (QID) | RESPIRATORY_TRACT | 2 refills | Status: DC | PRN

## 2022-04-18 MED ORDER — GLIPIZIDE ER 10 MG PO TB24: 10.0000 mg | ORAL_TABLET | Freq: Every day | ORAL | 1 refills | Status: DC

## 2022-04-18 MED ORDER — GUAIFENESIN ER 600 MG PO TB12
600.0000 mg | ORAL_TABLET | Freq: Two times a day (BID) | ORAL | Status: DC
  Administered 2022-04-18: 600 mg via ORAL
  Filled 2022-04-18: qty 1

## 2022-04-18 MED ORDER — APIXABAN 5 MG PO TABS: 5.0000 mg | ORAL_TABLET | Freq: Two times a day (BID) | ORAL | Status: AC

## 2022-04-18 MED ORDER — DEXTROMETHORPHAN-GUAIFENESIN 10-100 MG/5ML PO LIQD: 5.0000 mL | ORAL | 0 refills | Status: AC | PRN

## 2022-04-18 MED ORDER — GUAIFENESIN-DM 100-10 MG/5ML PO SYRP: 5.0000 mL | ORAL_SOLUTION | ORAL | Status: DC | PRN

## 2022-04-18 MED ORDER — GLIPIZIDE ER 10 MG PO TB24: 10.0000 mg | ORAL_TABLET | Freq: Every day | ORAL | Status: DC

## 2022-04-18 MED ORDER — DEXAMETHASONE 6 MG PO TABS
6.0000 mg | ORAL_TABLET | Freq: Once | ORAL | Status: AC
  Administered 2022-04-18: 6 mg via ORAL
  Filled 2022-04-18: qty 1

## 2022-04-18 NOTE — TOC Initial Note (Signed)
Transition of Care [facility]) - Initial/Assessment Note    Patient Details  Name: [name] MRN: [id number] Date of Birth: 1931-12-12  [facility] of [facility]) CM/SW Contact:    Sharley Sessions, RN Phone Number: 04/18/2022, 12:50 PM  Clinical Narrative:                 Admitted for: RSV Admitted from: [facility] PCP: Daughter states that it is the new provider on site at [facility] but is not sure of their name  Current home health/prior home health/DME: rollator  Therapy recommending home health.  Patient to use on site PT  Number provider to bedside RN to call report  DC summary, fl2, and PT order secure emailed to [place] [facility]  Daughter to transport         Patient Goals and CMS Choice            Expected Discharge Plan and Services         Expected Discharge Date: 04/18/22                                    Prior Living Arrangements/Services                       Activities of Daily Living      Permission Sought/Granted                  Emotional Assessment              Admission diagnosis:  RSV (acute bronchiolitis due to respiratory syncytial virus) [J21.0] Weakness [R53.1] Hypoxia [R09.02] RSV infection [B33.8] Patient Active Problem List   Diagnosis Date Noted   RSV infection 04/17/2022   Hyponatremia 04/17/2022   Aortic atherosclerosis ([place]) 09/05/2021   Dyslipidemia 07/25/2021   Essential hypertension 07/25/2021   DNR (do not resuscitate) 10/19/2020   Pressure injury of skin 12/24/2019   Abnormal echocardiogram 10/12/2019   Hip pain 12/23/2018   Lower extremity edema 12/24/2016   Current use of long term anticoagulation 09/17/2016   Paroxysmal atrial fibrillation ([place]) 08/13/2016   Senile purpura ([place]) 01/01/2015   Arthritis 07/12/2014   Basal cell carcinoma 07/12/2014   CC (collagenous colitis) 07/12/2014   Obesity 07/12/2014   Acne erythematosa 07/12/2014   Diabetes mellitus with  circulatory complication, without long-term current use of insulin ([place]) 07/12/2014   Avitaminosis D 07/12/2014   H/O malignant neoplasm of skin 10/10/2011   PCP:  Dewayne Shorter, MD Pharmacy:   [place], [place] - [address] [place] [address] Phone: [phone] Fax: [phone]     Social Determinants of Health (SDOH) Social History: SDOH Screenings   Food Insecurity: No Food Insecurity (04/25/2020)  Housing: Low Risk  (04/25/2020)  Transportation Needs: No Transportation Needs (04/25/2020)  Alcohol Screen: Low Risk  (10/19/2020)  Depression (PHQ2-9): Low Risk  (10/19/2020)  Financial Resource Strain: Low Risk  (04/25/2020)  Physical Activity: Inactive (04/25/2020)  Social Connections: Moderately Isolated (04/25/2020)  Stress: No Stress Concern Present (04/25/2020)  Tobacco Use: Medium Risk (04/18/2022)   SDOH Interventions:     Readmission Risk Interventions     No data to display

## 2022-04-18 NOTE — Evaluation (Signed)
Physical Therapy Evaluation Patient Details Name: [name] MRN: [id number] DOB: August 24, 1931 Today's Date: 04/18/2022  History of Present Illness  89 yo F with hx of HTN, DM2, PAF on eliquis, presents the ER today with a 2-day history of shortness of breath, wheezing.  Patient was tested for RSV yesterday at the assisted living facility [facility]).  Clinical Impression  Patient received in recliner, she is agreeable to PT assessment. Patient is mod I with sit to stand from recliner and toilet. She ambulates with RW and min guard. Reports difficulty with ambulation due to wheels on walker not rolling properly. Slow pace with gait. O2 sats at 95% after walking. She will continue to benefit from skilled PT to improve strength, balance, endurance and safety with mobility.         Recommendations for follow up therapy are one component of a multi-disciplinary discharge planning process, led by the attending physician.  Recommendations may be updated based on patient status, additional functional criteria and insurance authorization.  Follow Up Recommendations Home health PT      Assistance Recommended at Discharge Frequent or constant Supervision/Assistance  Patient can return home with the following  A little help with walking and/or transfers;A little help with bathing/dressing/bathroom    Equipment Recommendations None recommended by PT  Recommendations for Other Services       Functional Status Assessment Patient has had a recent decline in their functional status and demonstrates the ability to make significant improvements in function in a reasonable and predictable amount of time.     Precautions / Restrictions Precautions Precautions: Fall Restrictions Weight Bearing Restrictions: No      Mobility  Bed Mobility               General bed mobility comments: Not assessed, patient up in recliner    Transfers Overall transfer level: Modified independent Equipment  used: Rolling walker (2 wheels) Transfers: Sit to/from Stand Sit to Stand: Modified independent (Device/Increase time)           General transfer comment: no Assist needed to rise from low commode    Ambulation/Gait Ambulation/Gait assistance: Min guard Gait Distance (Feet): 125 Feet Assistive device: Rolling walker (2 wheels) Gait Pattern/deviations: Step-through pattern, Decreased step length - right, Decreased step length - left, Decreased stride length Gait velocity: decr     General Gait Details: patient reports this walker does not roll well and is a "accident waiting to happen",  Stairs            Wheelchair Mobility    Modified Rankin (Stroke Patients Only)       Balance Overall balance assessment: Mild deficits observed, not formally tested, Modified Independent Sitting-balance support: Feet supported Sitting balance-Leahy Scale: Normal     Standing balance support: Bilateral upper extremity supported, During functional activity Standing balance-Leahy Scale: Good                               Pertinent Vitals/Pain Pain Assessment Pain Assessment: No/denies pain    Home Living Family/patient expects to be discharged to:: Assisted living                 Home Equipment: Rollator (4 wheels)      Prior Function Prior Level of Function : Independent/Modified Independent             Mobility Comments: MOD I using Rollator ADLs Comments: assist for meals/medication  Hand Dominance        Extremity/Trunk Assessment   Upper Extremity Assessment Upper Extremity Assessment: Defer to OT evaluation    Lower Extremity Assessment Lower Extremity Assessment: Overall WFL for tasks assessed    Cervical / Trunk Assessment Cervical / Trunk Assessment: Normal  Communication   Communication: HOH  Cognition Arousal/Alertness: Awake/alert Behavior During Therapy: WFL for tasks assessed/performed Overall Cognitive Status:  Within Functional Limits for tasks assessed                                          General Comments      Exercises     Assessment/Plan    PT Assessment Patient needs continued PT services  PT Problem List Decreased activity tolerance;Decreased mobility;Decreased balance;Decreased strength;Cardiopulmonary status limiting activity       PT Treatment Interventions Gait training;Functional mobility training;Therapeutic activities;Patient/family education;Therapeutic exercise;Neuromuscular re-education    PT Goals (Current goals can be found in the Care Plan section)  Acute Rehab PT Goals Patient Stated Goal: to go home PT Goal Formulation: With patient/family Time For Goal Achievement: 04/22/22 Potential to Achieve Goals: Good    Frequency Min 2X/week     Co-evaluation               AM-PAC PT "6 Clicks" Mobility  Outcome Measure Help needed turning from your back to your side while in a flat bed without using bedrails?: A Little Help needed moving from lying on your back to sitting on the side of a flat bed without using bedrails?: A Little Help needed moving to and from a bed to a chair (including a wheelchair)?: A Little Help needed standing up from a chair using your arms (e.g., wheelchair or bedside chair)?: A Little Help needed to walk in hospital room?: A Little Help needed climbing 3-5 steps with a railing? : A Little 6 Click Score: 18    End of Session Equipment Utilized During Treatment: Gait belt Activity Tolerance: Patient tolerated treatment well Patient left: in chair;with call bell/phone within reach;with nursing/sitter in room Nurse Communication: Mobility status PT Visit Diagnosis: Other abnormalities of gait and mobility (R26.89);Muscle weakness (generalized) (M62.81);Difficulty in walking, not elsewhere classified (R26.2)    Time: [id number] PT Time Calculation (min) (ACUTE ONLY): 24 min   Charges:   PT Evaluation $PT Eval  Moderate Complexity: 1 Mod PT Treatments $Gait Training: 8-22 mins        Jerrin Recore, PT, GCS 04/18/22,11:03 AM

## 2022-04-18 NOTE — Progress Notes (Unsigned)
Eliquis discontinued on discharge accidentally.  This was brought to my attention by Dr. Dewayne Shorter, who cares for patient at ALF.  Discharge summary addended.

## 2022-04-18 NOTE — Evaluation (Signed)
Occupational Therapy Evaluation Patient Details Name: [name] MRN: [id number] DOB: 07-Dec-1931 Today's Date: 04/18/2022   History of Present Illness 89 yo F with hx of HTN, DM2, PAF on eliquis, presents the ER today with a 2-day history of shortness of breath, wheezing.  Patient was tested for RSV yesterday at the assisted living facility [facility]).   Clinical Impression   Pt was seen for OT evaluation this date. Prior to hospital admission, pt was MOD I using rollator. Pt lives alone at [facility]. Pt presents to acute OT demonstrating impaired ADL performance and functional mobility 2/2 decreased activity tolerance. Pt currently requires MIN A bed mobility. SpO2 90% sitting EOB on RA, improves to 94% in standing. SBA + RW toilet t/f, SUPERVISION standing grooming tasks. Pt would benefit from 1 follow up session for ECS. Upon hospital discharge, recommend no follow up.   Recommendations for follow up therapy are one component of a multi-disciplinary discharge planning process, led by the attending physician.  Recommendations may be updated based on patient status, additional functional criteria and insurance authorization.   Follow Up Recommendations  No OT follow up     Assistance Recommended at Discharge Set up Supervision/Assistance  Patient can return home with the following Help with stairs or ramp for entrance    Functional Status Assessment  Patient has had a recent decline in their functional status and demonstrates the ability to make significant improvements in function in a reasonable and predictable amount of time.  Equipment Recommendations  None recommended by OT    Recommendations for Other Services       Precautions / Restrictions Precautions Precautions: Fall Restrictions Weight Bearing Restrictions: No      Mobility Bed Mobility Overal bed mobility: Needs Assistance Bed Mobility: Supine to Sit     Supine to sit: Min assist     General bed  mobility comments: assist for trunk    Transfers Overall transfer level: Needs assistance Equipment used: Rolling walker (2 wheels) Transfers: Sit to/from Stand Sit to Stand: Supervision                  Balance Overall balance assessment: Needs assistance Sitting-balance support: No upper extremity supported, Feet supported Sitting balance-Leahy Scale: Fair   Postural control: Posterior lean Standing balance support: No upper extremity supported, During functional activity Standing balance-Leahy Scale: Fair                             ADL either performed or assessed with clinical judgement   ADL Overall ADL's : Needs assistance/impaired                                       General ADL Comments: SBA + RW toilet t/f. MAX A don B socks seated EOB. SUPERVISION standing grooming tasks      Pertinent Vitals/Pain Pain Assessment Pain Assessment: No/denies pain     Hand Dominance     Extremity/Trunk Assessment Upper Extremity Assessment Upper Extremity Assessment: Overall WFL for tasks assessed   Lower Extremity Assessment Lower Extremity Assessment: Generalized weakness       Communication Communication Communication: HOH   Cognition Arousal/Alertness: Awake/alert Behavior During Therapy: WFL for tasks assessed/performed Overall Cognitive Status: Within Functional Limits for tasks assessed  Home Living Family/patient expects to be discharged to:: Assisted living                             Home Equipment: Conservation officer, nature (2 wheels)          Prior Functioning/Environment Prior Level of Function : Independent/Modified Independent             Mobility Comments: MOD I using Rollator ADLs Comments: assist for meals/medication        OT Problem List: Decreased range of motion;Decreased activity tolerance;Impaired balance (sitting and/or  standing);Decreased strength      OT Treatment/Interventions: Self-care/ADL training;Therapeutic exercise;Energy conservation;DME and/or AE instruction;Therapeutic activities    OT Goals(Current goals can be found in the care plan section) Acute Rehab OT Goals Patient Stated Goal: to go home OT Goal Formulation: With patient Time For Goal Achievement: 05/02/22 Potential to Achieve Goals: Good ADL Goals Pt Will Transfer to Toilet: with modified independence;ambulating;regular height toilet Pt/caregiver will Perform Home Exercise Program: Increased strength;Both right and left upper extremity Additional ADL Goal #1: Pt will verbalize plan to implement x3 ECS during IADLs  OT Frequency: Min 1X/week    Co-evaluation              AM-PAC OT "6 Clicks" Daily Activity     Outcome Measure Help from another person eating meals?: None Help from another person taking care of personal grooming?: None Help from another person toileting, which includes using toliet, bedpan, or urinal?: A Little Help from another person bathing (including washing, rinsing, drying)?: A Little Help from another person to put on and taking off regular upper body clothing?: None Help from another person to put on and taking off regular lower body clothing?: A Lot 6 Click Score: 20   End of Session    Activity Tolerance: Patient tolerated treatment well Patient left: in chair;with call bell/phone within reach;with chair alarm set  OT Visit Diagnosis: Other abnormalities of gait and mobility (R26.89);Muscle weakness (generalized) (M62.81)                Time: [id number] OT Time Calculation (min): 25 min Charges:  OT General Charges $OT Visit: 1 Visit OT Evaluation $OT Eval Low Complexity: 1 Low OT Treatments $Self Care/Home Management : 8-22 mins  Dessie Coma, M.S. OTR/L  04/18/22, 10:34 AM  ascom [phone]

## 2022-04-18 NOTE — Discharge Summary (Addendum)
Physician Discharge Summary  OREATHA SUMMERVILLE [id number] DOB: [birth year] DOA: 04/17/2022  PCP: Dewayne Shorter, MD  Admit date: 04/17/2022 Discharge date: 04/18/2022 Admitted From: ALF Disposition: ALF Recommendations for Outpatient Follow-up:  Follow up with PCP in 1 week or sooner if needed Check glycemic control, CMP and CBC Continue droplet precaution for 4 to 5 days Please follow up on the following pending results: None  Home Health: HH PT Equipment/Devices: None  Discharge Condition: Stable CODE STATUS: DNR/DNI  Follow-up Information     Dewayne Shorter, MD. Schedule an appointment as soon as possible for a visit in 1 week(s).   Specialty: Family Medicine Why: 04/21/22 3:00 PM Dr. Unk Lightning to see patient at [facility]. Contact information: [place] [address] [phone]                 Hospital course 89 yo WF with hx of dementia, HTN, DM2, PAF on eliquis presented to ED with 2-day history of shortness of breath, wheezing.  Patient was tested for RSV at the assisted living facility [facility]) the day prior to presentation.  She has been more short of breath, and brought to ER by family.  No fever, chest pain GI or UTI symptoms.  In ED, febrile to 100.4.  Hemodynamically stable.  No hypoxemia.  RSV positive.  Chest x-ray showed chronically elevated left diaphragm and atelectasis at left base but no pneumonia.  Patient was started on Decadron and admitted.  The next day, remained stable on room air with saturation in mid 90s.  Evaluated by therapy and home health PT was recommended.  Decadron discontinued due to hyperglycemia.  She is discharged on albuterol as needed.   In regards to uncontrolled diabetes, A1c 8.3%.  She has hyperglycemia and 300s due to Decadron.  She refused insulin coverage.  Continued on home glipizide, Tradjenta and metformin.  She seems to take glipizide XR 10 mg twice daily along with glipizide IR.  We have discontinued  glipizide IR and decrease glipizide XL to 10 mg daily due to risk for hypoglycemia.  She is also on Tradjenta and metformin which were continued on discharge.    Recommend continuing droplet precaution for 4 to 5 days.   See individual problem list below for more.   Problems addressed during this hospitalization Principal Problem:   RSV infection Active Problems:   Paroxysmal atrial fibrillation (HCC)   Current use of long term anticoagulation   DNR (do not resuscitate)   Dyslipidemia   Essential hypertension   Diabetes mellitus with circulatory complication, without long-term current use of insulin (HCC)   Hyponatremia    Vital signs Vitals:   04/18/22 0425 04/18/22 0500 04/18/22 0729 04/18/22 1057  BP: 123/60  (!) 146/61   Pulse: 73  65   Temp: 99 F (37.2 C)  98.9 F (37.2 C)   Resp: 18  20   Height:      Weight:  79.1 kg    SpO2: 95%  93% 95%  TempSrc: Oral     BMI (Calculated):  34.06       Discharge exam  GENERAL: No apparent distress.  Nontoxic. HEENT: MMM.  Vision and hearing grossly intact.  NECK: Supple.  No apparent JVD.  RESP:  No IWOB.  Fair aeration bilaterally. CVS:  RRR. Heart sounds normal.  ABD/GI/GU: BS+. Abd soft, NTND.  MSK/EXT:  Moves extremities. No apparent deformity. No edema.  SKIN: no apparent skin lesion or wound NEURO: Awake and  alert. Oriented to self.  Follows commands.  No apparent focal neuro deficit. PSYCH: Calm. Normal affect.   Discharge Instructions Discharge Instructions     Call MD for:  difficulty breathing, headache or visual disturbances   Complete by: As directed    Diet - low sodium heart healthy   Complete by: As directed    Diet Carb Modified   Complete by: As directed    Increase activity slowly   Complete by: As directed       Allergies as of 04/18/2022       Reactions   Morphine Sulfate Other (See Comments)   BP bottoms out   Penicillins Diarrhea   Has patient had a PCN reaction causing immediate rash,  facial/tongue/throat swelling, SOB or lightheadedness with hypotension: Yes Has patient had a PCN reaction causing severe rash involving mucus membranes or skin necrosis: Yes Has patient had a PCN reaction that required hospitalization No Has patient had a PCN reaction occurring within the last 10 years: No If all of the above answers are "NO", then may proceed with Cephalosporin use.        Medication List     STOP taking these medications    bisacodyl 10 MG suppository Commonly known as: DULCOLAX   carbamide peroxide 6.5 % OTIC solution Commonly known as: DEBROX   cetirizine 10 MG tablet Commonly known as: ZYRTEC   diphenhydrAMINE 25 MG tablet Commonly known as: BENADRYL   Eliquis 5 MG Tabs tablet Generic drug: apixaban   HAIR SKIN AND NAILS FORMULA PO   Kaopectate 262 MG/15ML suspension Generic drug: bismuth subsalicylate   Milk of Magnesia 400 MG/5ML suspension Generic drug: magnesium hydroxide   nystatin powder Commonly known as: MYCOSTATIN/NYSTOP   ondansetron 4 MG tablet Commonly known as: ZOFRAN   OXYGEN   polyethylene glycol 17 g packet Commonly known as: MIRALAX / GLYCOLAX   UNABLE TO FIND   Vitamin D 50 MCG (2000 UT) Caps       TAKE these medications    acetaminophen 325 MG tablet Commonly known as: TYLENOL Take 650 mg by mouth 2 (two) times daily. Scheduled and 2 tablets by mouth every 4 hours as needed for pain   albuterol 108 (90 Base) MCG/ACT inhaler Commonly known as: VENTOLIN HFA Inhale 2 puffs into the lungs every 6 (six) hours as needed for wheezing or shortness of breath.   Clobetasol Propionate 0.05 % shampoo Apply 1 Application topically every morning. And wash out   dextromethorphan-guaiFENesin 10-100 MG/5ML liquid Commonly known as: ROBITUSSIN-DM Take 5 mLs by mouth every 4 (four) hours as needed for up to 5 days for cough. What changed:  how much to take how to take this when to take this reasons to take  this additional instructions   Ensure Take 237 mLs by mouth as needed (For weight loss or decreased intake).   fluocinolone 0.01 % external solution Commonly known as: SYNALAR Apply 1 Application topically at bedtime. Apply to scalp   furosemide 20 MG tablet Commonly known as: LASIX TAKE 1 TABLET BY MOUTH DAILY ONLY IF NEEDED.   glipiZIDE 10 MG 24 hr tablet Commonly known as: glipiZIDE XL Take 1 tablet (10 mg total) by mouth daily with breakfast. What changed: when to take this   Glucose 15 GM/32ML Gel Take 1 packet by mouth as needed (for low blood sugar).   linagliptin 5 MG Tabs tablet Commonly known as: Tradjenta Take 1 tablet (5 mg total) by mouth daily.   lisinopril  20 MG tablet Commonly known as: ZESTRIL TAKE 1 TABLET BY MOUTH DAILY   metFORMIN 500 MG 24 hr tablet Commonly known as: GLUCOPHAGE-XR Take 1 tablet (500 mg total) by mouth 2 (two) times daily.   metoprolol succinate 25 MG 24 hr tablet Commonly known as: TOPROL-XL TAKE 1 TABLET BY MOUTH ONCE DAILY   multivitamin with minerals Tabs tablet Take 1 tablet by mouth daily.       Addendum Continue Eliquis 5 mg twice daily on discharge.  Consultations: None  Procedures/Studies:    DG Chest Portable 1 View  Result Date: 04/17/2022 CLINICAL DATA:  Shortness of breath EXAM: PORTABLE CHEST 1 VIEW COMPARISON:  07/24/2021 FINDINGS: Chronic elevation left diaphragm. Mild hypoventilatory changes with atelectasis at the left base. No consolidation or effusion. Stable cardiomediastinal silhouette with aortic atherosclerosis IMPRESSION: No active disease. Chronic elevation left diaphragm with atelectasis at the left base. Electronically Signed   By: Donavan Foil M.D.   On: 04/17/2022 20:53       The results of significant diagnostics from this hospitalization (including imaging, microbiology, ancillary and laboratory) are listed below for reference.     Microbiology: Recent Results (from the past 240  hour(s))  Blood culture (routine x 2)     Status: None (Preliminary result)   Collection Time: 04/17/22  8:20 PM   Specimen: BLOOD  Result Value Ref Range Status   Specimen Description BLOOD LEFT AC  Final   Special Requests   Final    BOTTLES DRAWN AEROBIC AND ANAEROBIC Blood Culture results may not be optimal due to an excessive volume of blood received in culture bottles   Culture   Final    NO GROWTH < 12 HOURS Performed at [facility], [address] [place], [place] [address]    Report Status PENDING  Incomplete  Resp panel by RT-PCR (RSV, Flu A&B, Covid) Anterior Nasal Swab     Status: Abnormal   Collection Time: 04/17/22  8:20 PM   Specimen: Anterior Nasal Swab  Result Value Ref Range Status   SARS Coronavirus 2 by RT PCR NEGATIVE NEGATIVE Final    Comment: (NOTE) SARS-CoV-2 target nucleic acids are NOT DETECTED.  The SARS-CoV-2 RNA is generally detectable in upper respiratory specimens during the acute phase of infection. The lowest concentration of SARS-CoV-2 viral copies this assay can detect is 138 copies/mL. A negative result does not preclude SARS-Cov-2 infection and should not be used as the sole basis for treatment or other patient management decisions. A negative result may occur with  improper specimen collection/handling, submission of specimen other than nasopharyngeal swab, presence of viral mutation(s) within the areas targeted by this assay, and inadequate number of viral copies(<138 copies/mL). A negative result must be combined with clinical observations, patient history, and epidemiological information. The expected result is Negative.  Fact Sheet for Patients:  EntrepreneurPulse.com.au  Fact Sheet for Healthcare Providers:  IncredibleEmployment.be  This test is no t yet approved or cleared by the [place] FDA and  has been authorized for detection and/or diagnosis of SARS-CoV-2 by FDA under an  Emergency Use Authorization (EUA). This EUA will remain  in effect (meaning this test can be used) for the duration of the COVID-19 declaration under Section 564(b)(1) of the Act, 21 U.S.C.section 360bbb-3(b)(1), unless the authorization is terminated  or revoked sooner.       Influenza A by PCR NEGATIVE NEGATIVE Final   Influenza B by PCR NEGATIVE NEGATIVE Final    Comment: (NOTE) The  Xpert Xpress SARS-CoV-2/FLU/RSV plus assay is intended as an aid in the diagnosis of influenza from Nasopharyngeal swab specimens and should not be used as a sole basis for treatment. Nasal washings and aspirates are unacceptable for Xpert Xpress SARS-CoV-2/FLU/RSV testing.  Fact Sheet for Patients: EntrepreneurPulse.com.au  Fact Sheet for Healthcare Providers: IncredibleEmployment.be  This test is not yet approved or cleared by the [place] FDA and has been authorized for detection and/or diagnosis of SARS-CoV-2 by FDA under an Emergency Use Authorization (EUA). This EUA will remain in effect (meaning this test can be used) for the duration of the COVID-19 declaration under Section 564(b)(1) of the Act, 21 U.S.C. section 360bbb-3(b)(1), unless the authorization is terminated or revoked.     Resp Syncytial Virus by PCR POSITIVE (A) NEGATIVE Final    Comment: (NOTE) Fact Sheet for Patients: EntrepreneurPulse.com.au  Fact Sheet for Healthcare Providers: IncredibleEmployment.be  This test is not yet approved or cleared by the [place] FDA and has been authorized for detection and/or diagnosis of SARS-CoV-2 by FDA under an Emergency Use Authorization (EUA). This EUA will remain in effect (meaning this test can be used) for the duration of the COVID-19 declaration under Section 564(b)(1) of the Act, 21 U.S.C. section 360bbb-3(b)(1), unless the authorization is terminated or revoked.  Performed at [facility], [address] [place], [place] [address]   Blood culture (routine x 2)     Status: None (Preliminary result)   Collection Time: 04/17/22  9:27 PM   Specimen: BLOOD  Result Value Ref Range Status   Specimen Description BLOOD RIGHT FA  Final   Special Requests   Final    BOTTLES DRAWN AEROBIC AND ANAEROBIC Blood Culture adequate volume   Culture   Final    NO GROWTH < 12 HOURS Performed at [facility], [place], [place] [address]    Report Status PENDING  Incomplete     Labs:  CBC: Recent Labs  Lab 04/17/22 2020 04/18/22 0557  WBC 8.0 6.4  NEUTROABS  --  5.1  HGB 13.7 13.1  HCT 41.3 40.0  MCV 89.2 89.7  PLT 207 191   BMP &GFR Recent Labs  Lab 04/17/22 2020 04/18/22 0557  NA 132* 135  K 3.8 4.1  CL 98 104  CO2 21* 23  GLUCOSE 263* 307*  BUN 18 24*  CREATININE 0.80 0.92  CALCIUM 9.1 8.3*   Estimated Creatinine Clearance: 39.3 mL/min (by C-G formula based on SCr of 0.92 mg/dL). Liver & Pancreas: Recent Labs  Lab 04/17/22 2020 04/18/22 0557  AST 24 21  ALT 26 25  ALKPHOS 57 48  BILITOT 1.0 0.8  PROT 7.7 6.7  ALBUMIN 4.2 3.4*   No results for input(s): "LIPASE", "AMYLASE" in the last 168 hours. No results for input(s): "AMMONIA" in the last 168 hours. Diabetic: Recent Labs    04/17/22 2020  HGBA1C 8.3*   Recent Labs  Lab 04/18/22 0026 04/18/22 0834 04/18/22 1115  GLUCAP 265* 331* 370*   Cardiac Enzymes: No results for input(s): "CKTOTAL", "CKMB", "CKMBINDEX", "TROPONINI" in the last 168 hours. No results for input(s): "PROBNP" in the last 8760 hours. Coagulation Profile: No results for input(s): "INR", "PROTIME" in the last 168 hours. Thyroid Function Tests: Recent Labs    04/17/22 2020  TSH 2.284   Lipid Profile: No results for input(s): "CHOL", "HDL", "LDLCALC", "TRIG", "CHOLHDL", "LDLDIRECT" in the last 72 hours. Anemia Panel: No results for input(s): "VITAMINB12", "FOLATE", "FERRITIN", "TIBC",  "IRON", "  RETICCTPCT" in the last 72 hours. Urine analysis:    Component Value Date/Time   COLORURINE YELLOW (A) 07/24/2021 1826   APPEARANCEUR CLEAR (A) 07/24/2021 1826   APPEARANCEUR Clear 11/21/2015 0919   LABSPEC 1.025 07/24/2021 1826   PHURINE 5.0 07/24/2021 1826   GLUCOSEU >=500 (A) 07/24/2021 1826   HGBUR NEGATIVE 07/24/2021 1826   BILIRUBINUR NEGATIVE 07/24/2021 1826   BILIRUBINUR neg 12/05/2019 1206   BILIRUBINUR Negative 11/21/2015 0919   KETONESUR 20 (A) 07/24/2021 1826   PROTEINUR NEGATIVE 07/24/2021 1826   UROBILINOGEN 0.2 12/05/2019 1206   NITRITE NEGATIVE 07/24/2021 1826   LEUKOCYTESUR NEGATIVE 07/24/2021 1826   Sepsis Labs: Invalid input(s): "PROCALCITONIN", "LACTICIDVEN"   SIGNED:  Mercy Riding, MD  [facility] 04/18/2022, 3:59 PM

## 2022-04-18 NOTE — NC FL2 (Signed)
[place] LEVEL OF CARE FORM     IDENTIFICATION  Patient Name: [name] Birthdate: 10/27/1931 Sex: female Admission Date (Current Location): 04/17/2022  [facility] and [place] Number:  Engineering geologist and Address:         Provider Number: [id number]  Attending Physician Name and Address:  Mercy Riding, MD  Relative Name and Phone Number:       Current Level of Care: Hospital Recommended Level of Care: [place] Prior Approval Number:    Date Approved/Denied:   PASRR Number:    Discharge Plan: Other (Comment) (ALF)    Current Diagnoses: Patient Active Problem List   Diagnosis Date Noted   RSV infection 04/17/2022   Hyponatremia 04/17/2022   Aortic atherosclerosis ([place]) 09/05/2021   Dyslipidemia 07/25/2021   Essential hypertension 07/25/2021   DNR (do not resuscitate) 10/19/2020   Pressure injury of skin 12/24/2019   Abnormal echocardiogram 10/12/2019   Hip pain 12/23/2018   Lower extremity edema 12/24/2016   Current use of long term anticoagulation 09/17/2016   Paroxysmal atrial fibrillation ([place]) 08/13/2016   Senile purpura ([place]) 01/01/2015   Arthritis 07/12/2014   Basal cell carcinoma 07/12/2014   CC (collagenous colitis) 07/12/2014   Obesity 07/12/2014   Acne erythematosa 07/12/2014   Diabetes mellitus with circulatory complication, without long-term current use of insulin ([place]) 07/12/2014   Avitaminosis D 07/12/2014   H/O malignant neoplasm of skin 10/10/2011    Orientation RESPIRATION BLADDER Height & Weight     Self, Time, Situation, Place  Normal Continent Weight: 79.1 kg Height:  5' (152.4 cm)  BEHAVIORAL SYMPTOMS/MOOD NEUROLOGICAL BOWEL NUTRITION STATUS      Continent Diet (Heart healthy carb modified)  AMBULATORY STATUS COMMUNICATION OF NEEDS Skin   Supervision Verbally Normal                       Personal Care Assistance Level of Assistance              Functional Limitations Info   Sight, Hearing Sight Info: Impaired Hearing Info: Impaired      SPECIAL CARE FACTORS FREQUENCY  PT (By licensed PT)     PT Frequency: on site therapy              Contractures Contractures Info: Not present    Additional Factors Info  Code Status, Isolation Precautions, Allergies Code Status Info: DNR Allergies Info: Morphine sulfate, penicillin     Isolation Precautions Info: RSV     Medication List       STOP taking these medications     bisacodyl 10 MG suppository Commonly known as: DULCOLAX    carbamide peroxide 6.5 % OTIC solution Commonly known as: DEBROX    cetirizine 10 MG tablet Commonly known as: ZYRTEC    diphenhydrAMINE 25 MG tablet Commonly known as: BENADRYL    Eliquis 5 MG Tabs tablet Generic drug: apixaban    HAIR SKIN AND NAILS FORMULA PO    Kaopectate 262 MG/15ML suspension Generic drug: bismuth subsalicylate    Milk of Magnesia 400 MG/5ML suspension Generic drug: magnesium hydroxide    nystatin powder Commonly known as: MYCOSTATIN/NYSTOP    ondansetron 4 MG tablet Commonly known as: ZOFRAN    polyethylene glycol 17 g packet Commonly known as: MIRALAX / GLYCOLAX    UNABLE TO FIND    Vitamin D 50 MCG (2000 UT) Caps           TAKE these  medications     acetaminophen 325 MG tablet Commonly known as: TYLENOL Take 650 mg by mouth 2 (two) times daily. Scheduled and 2 tablets by mouth every 4 hours as needed for pain    albuterol 108 (90 Base) MCG/ACT inhaler Commonly known as: VENTOLIN HFA Inhale 2 puffs into the lungs every 6 (six) hours as needed for wheezing or shortness of breath.    Clobetasol Propionate 0.05 % shampoo Apply 1 Application topically every morning. And wash out    dextromethorphan-guaiFENesin 10-100 MG/5ML liquid Commonly known as: ROBITUSSIN-DM Take 5 mLs by mouth every 4 (four) hours as needed for up to 5 days for cough. What changed:  how much to take how to take this when to take  this reasons to take this additional instructions    Ensure Take 237 mLs by mouth as needed (For weight loss or decreased intake).    fluocinolone 0.01 % external solution Commonly known as: SYNALAR Apply 1 Application topically at bedtime. Apply to scalp    furosemide 20 MG tablet Commonly known as: LASIX TAKE 1 TABLET BY MOUTH DAILY ONLY IF NEEDED.    glipiZIDE 10 MG 24 hr tablet Commonly known as: glipiZIDE XL Take 1 tablet (10 mg total) by mouth daily with breakfast. What changed: when to take this    Glucose 15 GM/32ML Gel Take 1 packet by mouth as needed (for low blood sugar).    linagliptin 5 MG Tabs tablet Commonly known as: Tradjenta Take 1 tablet (5 mg total) by mouth daily.    lisinopril 20 MG tablet Commonly known as: ZESTRIL TAKE 1 TABLET BY MOUTH DAILY    metFORMIN 500 MG 24 hr tablet Commonly known as: GLUCOPHAGE-XR Take 1 tablet (500 mg total) by mouth 2 (two) times daily.    metoprolol succinate 25 MG 24 hr tablet Commonly known as: TOPROL-XL TAKE 1 TABLET BY MOUTH ONCE DAILY    multivitamin with minerals Tabs tablet Take 1 tablet by mouth daily.  Relevant Imaging Results:  Relevant Lab Results:   Additional Information SSN:[id number]  Anjeli Sessions, RN

## 2022-04-18 NOTE — ED Notes (Addendum)
Patient states she does not take insulin at home and does not want any while here.

## 2022-04-19 ENCOUNTER — Emergency Department: Payer: Medicare Other

## 2022-04-19 ENCOUNTER — Inpatient Hospital Stay
Admission: EM | Admit: 2022-04-19 | Discharge: 2022-04-22 | DRG: 191 | Disposition: A | Discharge status: Home Health | Payer: Medicare Other | Attending: Internal Medicine | Admitting: Internal Medicine

## 2022-04-19 DIAGNOSIS — E1159 Type 2 diabetes mellitus with other circulatory complications: Secondary | ICD-10-CM | POA: Diagnosis present

## 2022-04-19 DIAGNOSIS — E669 Obesity, unspecified: Secondary | ICD-10-CM | POA: Diagnosis present

## 2022-04-19 DIAGNOSIS — Z79899 Other long term (current) drug therapy: Secondary | ICD-10-CM

## 2022-04-19 DIAGNOSIS — Z85828 Personal history of other malignant neoplasm of skin: Secondary | ICD-10-CM

## 2022-04-19 DIAGNOSIS — I10 Essential (primary) hypertension: Secondary | ICD-10-CM | POA: Diagnosis present

## 2022-04-19 DIAGNOSIS — E119 Type 2 diabetes mellitus without complications: Secondary | ICD-10-CM | POA: Diagnosis not present

## 2022-04-19 DIAGNOSIS — R062 Wheezing: Secondary | ICD-10-CM

## 2022-04-19 DIAGNOSIS — F0390 Unspecified dementia without behavioral disturbance: Secondary | ICD-10-CM | POA: Diagnosis present

## 2022-04-19 DIAGNOSIS — B974 Respiratory syncytial virus as the cause of diseases classified elsewhere: Secondary | ICD-10-CM | POA: Diagnosis present

## 2022-04-19 DIAGNOSIS — Z8249 Family history of ischemic heart disease and other diseases of the circulatory system: Secondary | ICD-10-CM

## 2022-04-19 DIAGNOSIS — T380X5A Adverse effect of glucocorticoids and synthetic analogues, initial encounter: Secondary | ICD-10-CM | POA: Diagnosis present

## 2022-04-19 DIAGNOSIS — Z1152 Encounter for screening for COVID-19: Secondary | ICD-10-CM

## 2022-04-19 DIAGNOSIS — Z7901 Long term (current) use of anticoagulants: Secondary | ICD-10-CM

## 2022-04-19 DIAGNOSIS — J441 Chronic obstructive pulmonary disease with (acute) exacerbation: Secondary | ICD-10-CM | POA: Diagnosis not present

## 2022-04-19 DIAGNOSIS — J432 Centrilobular emphysema: Secondary | ICD-10-CM | POA: Diagnosis present

## 2022-04-19 DIAGNOSIS — J9811 Atelectasis: Secondary | ICD-10-CM | POA: Diagnosis present

## 2022-04-19 DIAGNOSIS — I480 Paroxysmal atrial fibrillation: Secondary | ICD-10-CM | POA: Diagnosis not present

## 2022-04-19 DIAGNOSIS — Z7951 Long term (current) use of inhaled steroids: Secondary | ICD-10-CM

## 2022-04-19 DIAGNOSIS — Z880 Allergy status to penicillin: Secondary | ICD-10-CM

## 2022-04-19 DIAGNOSIS — E785 Hyperlipidemia, unspecified: Secondary | ICD-10-CM | POA: Diagnosis present

## 2022-04-19 DIAGNOSIS — Z7984 Long term (current) use of oral hypoglycemic drugs: Secondary | ICD-10-CM

## 2022-04-19 DIAGNOSIS — Z833 Family history of diabetes mellitus: Secondary | ICD-10-CM

## 2022-04-19 DIAGNOSIS — J449 Chronic obstructive pulmonary disease, unspecified: Secondary | ICD-10-CM | POA: Diagnosis present

## 2022-04-19 DIAGNOSIS — Z87891 Personal history of nicotine dependence: Secondary | ICD-10-CM

## 2022-04-19 DIAGNOSIS — Z6834 Body mass index (BMI) 34.0-34.9, adult: Secondary | ICD-10-CM

## 2022-04-19 DIAGNOSIS — Z885 Allergy status to narcotic agent status: Secondary | ICD-10-CM

## 2022-04-19 DIAGNOSIS — Z66 Do not resuscitate: Secondary | ICD-10-CM | POA: Diagnosis present

## 2022-04-19 DIAGNOSIS — Z5320 Procedure and treatment not carried out because of patient's decision for unspecified reasons: Secondary | ICD-10-CM | POA: Diagnosis present

## 2022-04-19 DIAGNOSIS — M25551 Pain in right hip: Secondary | ICD-10-CM

## 2022-04-19 DIAGNOSIS — E1165 Type 2 diabetes mellitus with hyperglycemia: Secondary | ICD-10-CM | POA: Diagnosis present

## 2022-04-19 DIAGNOSIS — Z801 Family history of malignant neoplasm of trachea, bronchus and lung: Secondary | ICD-10-CM

## 2022-04-19 LAB — BASIC METABOLIC PANEL WITH GFR
Anion gap: 7 (ref 5–15)
BUN: 28 mg/dL — ABNORMAL HIGH (ref 8–23)
CO2: 26 mmol/L (ref 22–32)
Calcium: 8.9 mg/dL (ref 8.9–10.3)
Chloride: 105 mmol/L (ref 98–111)
Creatinine, Ser: 0.95 mg/dL (ref 0.44–1.00)
GFR, Estimated: 58 mL/min — ABNORMAL LOW
Glucose, Bld: 136 mg/dL — ABNORMAL HIGH (ref 70–99)
Potassium: 3.8 mmol/L (ref 3.5–5.1)
Sodium: 138 mmol/L (ref 135–145)

## 2022-04-19 LAB — CBC WITH DIFFERENTIAL/PLATELET
Abs Immature Granulocytes: 0.03 10*3/uL (ref 0.00–0.07)
Basophils Absolute: 0 10*3/uL (ref 0.0–0.1)
Basophils Relative: 0 %
Eosinophils Absolute: 0.3 10*3/uL (ref 0.0–0.5)
Eosinophils Relative: 3 %
HCT: 40.3 % (ref 36.0–46.0)
Hemoglobin: 13.5 g/dL (ref 12.0–15.0)
Immature Granulocytes: 0 %
Lymphocytes Relative: 44 %
Lymphs Abs: 3.3 10*3/uL (ref 0.7–4.0)
MCH: 29.4 pg (ref 26.0–34.0)
MCHC: 33.5 g/dL (ref 30.0–36.0)
MCV: 87.8 fL (ref 80.0–100.0)
Monocytes Absolute: 1 10*3/uL (ref 0.1–1.0)
Monocytes Relative: 13 %
Neutro Abs: 3.1 10*3/uL (ref 1.7–7.7)
Neutrophils Relative %: 40 %
Platelets: 202 10*3/uL (ref 150–400)
RBC: 4.59 MIL/uL (ref 3.87–5.11)
RDW: 12.1 % (ref 11.5–15.5)
WBC: 7.6 10*3/uL (ref 4.0–10.5)
nRBC: 0 % (ref 0.0–0.2)

## 2022-04-19 LAB — CBG MONITORING, ED: Glucose-Capillary: 125 mg/dL — ABNORMAL HIGH (ref 70–99)

## 2022-04-19 MED ORDER — LISINOPRIL 20 MG PO TABS
20.0000 mg | ORAL_TABLET | Freq: Every day | ORAL | Status: DC
  Administered 2022-04-20 – 2022-04-22 (×3): 20 mg via ORAL
  Filled 2022-04-19: qty 1
  Filled 2022-04-19: qty 2
  Filled 2022-04-19: qty 1

## 2022-04-19 MED ORDER — SODIUM CHLORIDE 0.9 % IV SOLN
1.0000 g | INTRAVENOUS | Status: DC
  Administered 2022-04-19: 1 g via INTRAVENOUS
  Filled 2022-04-19: qty 10

## 2022-04-19 MED ORDER — ALBUTEROL SULFATE (2.5 MG/3ML) 0.083% IN NEBU
2.5000 mg | INHALATION_SOLUTION | RESPIRATORY_TRACT | Status: DC
  Administered 2022-04-19: 2.5 mg via RESPIRATORY_TRACT
  Filled 2022-04-19: qty 3

## 2022-04-19 MED ORDER — INSULIN ASPART 100 UNIT/ML IJ SOLN
0.0000 [IU] | Freq: Three times a day (TID) | INTRAMUSCULAR | Status: DC
  Administered 2022-04-20: 8 [IU] via SUBCUTANEOUS
  Administered 2022-04-20: 15 [IU] via SUBCUTANEOUS
  Administered 2022-04-20: 11 [IU] via SUBCUTANEOUS
  Administered 2022-04-21: 8 [IU] via SUBCUTANEOUS
  Filled 2022-04-19 (×4): qty 1

## 2022-04-19 MED ORDER — GLIPIZIDE ER 10 MG PO TB24
10.0000 mg | ORAL_TABLET | Freq: Every day | ORAL | Status: DC
  Administered 2022-04-20: 10 mg via ORAL
  Filled 2022-04-19: qty 1

## 2022-04-19 MED ORDER — LINAGLIPTIN 5 MG PO TABS
5.0000 mg | ORAL_TABLET | Freq: Every day | ORAL | Status: DC
  Administered 2022-04-20 – 2022-04-22 (×3): 5 mg via ORAL
  Filled 2022-04-19 (×3): qty 1

## 2022-04-19 MED ORDER — PREDNISONE 20 MG PO TABS
40.0000 mg | ORAL_TABLET | Freq: Every day | ORAL | Status: DC
  Administered 2022-04-21 – 2022-04-22 (×2): 40 mg via ORAL
  Filled 2022-04-19 (×2): qty 2

## 2022-04-19 MED ORDER — ACETAMINOPHEN 325 MG PO TABS
650.0000 mg | ORAL_TABLET | Freq: Four times a day (QID) | ORAL | Status: DC | PRN
  Administered 2022-04-21: 650 mg via ORAL
  Filled 2022-04-19: qty 2

## 2022-04-19 MED ORDER — ONDANSETRON HCL 4 MG PO TABS: 4.0000 mg | ORAL_TABLET | Freq: Four times a day (QID) | ORAL | Status: DC | PRN

## 2022-04-19 MED ORDER — APIXABAN 5 MG PO TABS
5.0000 mg | ORAL_TABLET | Freq: Two times a day (BID) | ORAL | Status: DC
  Administered 2022-04-19 – 2022-04-22 (×6): 5 mg via ORAL
  Filled 2022-04-19 (×6): qty 1

## 2022-04-19 MED ORDER — SODIUM CHLORIDE 0.9 % IV SOLN: INTRAVENOUS | Status: DC

## 2022-04-19 MED ORDER — IPRATROPIUM-ALBUTEROL 0.5-2.5 (3) MG/3ML IN SOLN
3.0000 mL | Freq: Once | RESPIRATORY_TRACT | Status: AC
  Administered 2022-04-19: 3 mL via RESPIRATORY_TRACT
  Filled 2022-04-19: qty 3

## 2022-04-19 MED ORDER — IPRATROPIUM-ALBUTEROL 0.5-2.5 (3) MG/3ML IN SOLN
3.0000 mL | Freq: Four times a day (QID) | RESPIRATORY_TRACT | Status: DC
  Administered 2022-04-19 – 2022-04-22 (×11): 3 mL via RESPIRATORY_TRACT
  Filled 2022-04-19 (×11): qty 3

## 2022-04-19 MED ORDER — METHYLPREDNISOLONE SODIUM SUCC 125 MG IJ SOLR
60.0000 mg | Freq: Once | INTRAMUSCULAR | Status: AC
  Administered 2022-04-19: 60 mg via INTRAVENOUS
  Filled 2022-04-19: qty 2

## 2022-04-19 MED ORDER — ACETAMINOPHEN 650 MG RE SUPP: 650.0000 mg | Freq: Four times a day (QID) | RECTAL | Status: DC | PRN

## 2022-04-19 MED ORDER — ONDANSETRON HCL 4 MG/2ML IJ SOLN: 4.0000 mg | Freq: Four times a day (QID) | INTRAMUSCULAR | Status: DC | PRN

## 2022-04-19 MED ORDER — METOPROLOL SUCCINATE ER 25 MG PO TB24
25.0000 mg | ORAL_TABLET | Freq: Every day | ORAL | Status: DC
  Administered 2022-04-20 – 2022-04-22 (×3): 25 mg via ORAL
  Filled 2022-04-19 (×3): qty 1

## 2022-04-19 MED ORDER — MAGNESIUM HYDROXIDE 400 MG/5ML PO SUSP: 30.0000 mL | Freq: Every day | ORAL | Status: DC | PRN

## 2022-04-19 MED ORDER — TRAZODONE HCL 50 MG PO TABS: 25.0000 mg | ORAL_TABLET | Freq: Every evening | ORAL | Status: DC | PRN

## 2022-04-19 MED ORDER — ADULT MULTIVITAMIN W/MINERALS CH
1.0000 | ORAL_TABLET | Freq: Every day | ORAL | Status: DC
  Administered 2022-04-19 – 2022-04-22 (×4): 1 via ORAL
  Filled 2022-04-19 (×4): qty 1

## 2022-04-19 MED ORDER — GUAIFENESIN ER 600 MG PO TB12
600.0000 mg | ORAL_TABLET | Freq: Two times a day (BID) | ORAL | Status: DC
  Administered 2022-04-19 – 2022-04-22 (×6): 600 mg via ORAL
  Filled 2022-04-19 (×6): qty 1

## 2022-04-19 MED ORDER — METHYLPREDNISOLONE SODIUM SUCC 40 MG IJ SOLR
40.0000 mg | Freq: Two times a day (BID) | INTRAMUSCULAR | Status: AC
  Administered 2022-04-19 – 2022-04-20 (×2): 40 mg via INTRAVENOUS
  Filled 2022-04-19 (×2): qty 1

## 2022-04-19 MED ORDER — ENSURE PO LIQD: 237.0000 mL | ORAL | Status: DC | PRN

## 2022-04-19 MED ORDER — HYDROCOD POLI-CHLORPHE POLI ER 10-8 MG/5ML PO SUER
5.0000 mL | Freq: Two times a day (BID) | ORAL | Status: DC | PRN
  Administered 2022-04-20: 5 mL via ORAL
  Filled 2022-04-19: qty 5

## 2022-04-19 NOTE — H&P (Signed)
[facility]   PATIENT NAME: [name]    MR#:  [id number]  DATE OF BIRTH:  September 25, 1931  DATE OF ADMISSION:  04/19/2022  PRIMARY CARE PHYSICIAN: Dewayne Shorter, MD   Patient is coming from: Home  REQUESTING/REFERRING PHYSICIAN: Merlyn Lot, MD  CHIEF COMPLAINT:   Chief Complaint  Patient presents with   Shortness of Breath    HISTORY OF PRESENT ILLNESS:  [name] is a 89 y.o. Caucasian female with medical history significant for dementia, hypertension, type 2 diabetes mellitus, paroxysmal atrial fibrillation on Eliquis and dyslipidemia, who presented to the emergency room with acute onset of worsening dyspnea with associated productive cough and wheezing.  She was just discharged home yesterday after being managed here for RSV infection for a day.  She was treated with Decadron and PR and albuterol.  She became hyper glycemic on Decadron and refused insulin but continued on her glipizide.  No fever or chills.  No chest pain or palpitations.  No nausea or vomiting or abdominal pain.  No dysuria, oliguria or hematuria or flank pain.  ED Course: Upon presentation to the ER BP was 144/68 with respiratory rate of 24 and pulse 70 was 94 to 97% on room air.  Labs revealed BUN of 28 with otherwise unremarkable BMP.  CBC was within normal. EKG as reviewed by me : Sinus rhythm with sinus arrhythmia with a rate of 83. Imaging: Two-view x-ray showed no acute cardiopulmonary disease. Chest CT without contrast revealed no acute intrathoracic pathology.  It showed mildly dilated main pulmonary trunk suggestive of pulmonary hypertension, fatty liver, colonic diverticulosis and mild centrilobular emphysema with no focal consolidation.  The patient was given 2 DuoNebs and 60 mg of IV Solu-Medrol as well as 2.5 mg of albuterol nebulizer.  She will be admitted to an observation medical telemetry bed for further evaluation and management. PAST MEDICAL HISTORY:   Past Medical  History:  Diagnosis Date   Acute pancreatitis 07/24/2021   Basal cell carcinoma 03/30/2008   left upper arm/excision   Basal cell carcinoma 03/31/2008   left upper arm, right lower leg   Basal cell carcinoma 01/18/2009   left upper arm   Collagenous colitis    Diabetes mellitus without complication ([place])    Diverticulitis    Elevated LFTs 07/25/2021   Heart murmur    at birth, none since   History of echocardiogram    a. 11/2015: echo showing EF of 60-65% with no WMA. Grade 1 DD and mild MR noted.    Hyperlipidemia    Per [facility]   Hypertension    Intertrochanteric fracture of right femur ([place]) 06/11/2019   Lack of coordination    Per [facility] term current use of anticoagulant    Per [facility]   Noninfective gastroenteritis and colitis, unspecified    Per [facility]   PAF (paroxysmal atrial fibrillation) ([place])    a. initially diagnosed in 08/2016 --> started on Eliquis   Squamous cell carcinoma of skin 05/16/2020   Left nasal tip anterior to ala, refer to [facility]   Syncope    a. initially occurring in Fall 2016 b. Hospitalized in 10/2015 and 11/2015 for recurrent episodes.     PAST SURGICAL HISTORY:   Past Surgical History:  Procedure Laterality Date   ABDOMINAL HYSTERECTOMY     BACK SURGERY  08/08/2008   Lumbar discectomy   COLONOSCOPY WITH PROPOFOL     EXCISION/RELEASE BURSA HIP Left  05/03/2019   Procedure: HIP ABDUCTOR REPAIR;  Surgeon: Hessie Knows, MD;  Location: [facility] ORS;  Service: Orthopedics;  Laterality: Left;   INTRAMEDULLARY (IM) NAIL INTERTROCHANTERIC Right 06/13/2019   Procedure: INTRAMEDULLARY (IM) NAIL INTERTROCHANTRIC;  Surgeon: Hessie Knows, MD;  Location: [facility] ORS;  Service: Orthopedics;  Laterality: Right;   NECK SURGERY  1980    SOCIAL HISTORY:   Social History   Tobacco Use   Smoking status: Former    Types: Cigarettes    Quit date: 03/09/1978    Years since quitting: 44.1   Smokeless tobacco: Never  Substance Use Topics    Alcohol use: Not Currently    Comment: once a month--mixed drink    FAMILY HISTORY:   Family History  Problem Relation Age of Onset   Hypertension Mother    Diabetes Mother    Lung cancer Father     DRUG ALLERGIES:   Allergies  Allergen Reactions   Morphine Sulfate Other (See Comments)    BP bottoms out   Penicillins Diarrhea    Has patient had a PCN reaction causing immediate rash, facial/tongue/throat swelling, SOB or lightheadedness with hypotension: Yes Has patient had a PCN reaction causing severe rash involving mucus membranes or skin necrosis: Yes Has patient had a PCN reaction that required hospitalization No Has patient had a PCN reaction occurring within the last 10 years: No If all of the above answers are "NO", then may proceed with Cephalosporin use.    REVIEW OF SYSTEMS:   ROS As per history of present illness. All pertinent systems were reviewed above. Constitutional, HEENT, cardiovascular, respiratory, GI, GU, musculoskeletal, neuro, psychiatric, endocrine, integumentary and hematologic systems were reviewed and are otherwise negative/unremarkable except for positive findings mentioned above in the HPI.   MEDICATIONS AT HOME:   Prior to Admission medications   Medication Sig Start Date End Date Taking? Authorizing Provider  acetaminophen (TYLENOL) 325 MG tablet Take 650 mg by mouth 2 (two) times daily. Scheduled and 2 tablets by mouth every 4 hours as needed for pain    [provider]  albuterol (VENTOLIN HFA) 108 (90 Base) MCG/ACT inhaler Inhale 2 puffs into the lungs every 6 (six) hours as needed for wheezing or shortness of breath. 04/18/22   Mercy Riding, MD  apixaban (ELIQUIS) 5 MG TABS tablet Take 1 tablet (5 mg total) by mouth 2 (two) times daily. 04/18/22   Mercy Riding, MD  Clobetasol Propionate 0.05 % shampoo Apply 1 Application topically every morning. And wash out    [provider]  dextromethorphan-guaiFENesin (ROBITUSSIN-DM)  10-100 MG/5ML liquid Take 5 mLs by mouth every 4 (four) hours as needed for up to 5 days for cough. 04/18/22 04/23/22  Mercy Riding, MD  Ensure (ENSURE) Take 237 mLs by mouth as needed (For weight loss or decreased intake). Patient not taking: Reported on 04/17/2022    [provider]  fluocinolone (SYNALAR) 0.01 % external solution Apply 1 Application topically at bedtime. Apply to scalp    [provider]  furosemide (LASIX) 20 MG tablet TAKE 1 TABLET BY MOUTH DAILY ONLY IF NEEDED. Patient not taking: Reported on 04/17/2022 03/20/21   Virginia Crews, MD  glipiZIDE (GLIPIZIDE XL) 10 MG 24 hr tablet Take 1 tablet (10 mg total) by mouth daily with breakfast. 04/18/22   Mercy Riding, MD  Glucose 15 GM/32ML GEL Take 1 packet by mouth as needed (for low blood sugar). Patient not taking: Reported on 04/17/2022    [provider]  linagliptin (TRADJENTA) 5 MG TABS tablet Take 1 tablet (5 mg total) by mouth daily. 09/17/21   Venia Carbon, MD  lisinopril (ZESTRIL) 20 MG tablet TAKE 1 TABLET BY MOUTH DAILY 10/14/21   End, Harrell Gave, MD  metFORMIN (GLUCOPHAGE-XR) 500 MG 24 hr tablet Take 1 tablet (500 mg total) by mouth 2 (two) times daily. 09/05/21   Jearld Fenton, NP  metoprolol succinate (TOPROL-XL) 25 MG 24 hr tablet TAKE 1 TABLET BY MOUTH ONCE DAILY 04/17/22   End, Harrell Gave, MD  Multiple Vitamin (MULTIVITAMIN WITH MINERALS) TABS tablet Take 1 tablet by mouth daily.    [provider]      VITAL SIGNS:  Blood pressure (!) 158/61, pulse 90, temperature 98.7 F (37.1 C), temperature source Oral, resp. rate (!) 26, height 5' (1.524 m), weight 79.1 kg, SpO2 94 %.  PHYSICAL EXAMINATION:  Physical Exam  GENERAL:  89 y.o.-year-old patient lying in the bed with no acute distress.  EYES: Pupils equal, round, reactive to light and accommodation. No scleral icterus. Extraocular muscles intact.  HEENT: Head atraumatic, normocephalic. Oropharynx and nasopharynx clear.   NECK:  Supple, no jugular venous distention. No thyroid enlargement, no tenderness.  LUNGS: Normal breath sounds bilaterally, no wheezing, rales,rhonchi or crepitation. No use of accessory muscles of respiration.  CARDIOVASCULAR: Regular rate and rhythm, S1, S2 normal. No murmurs, rubs, or gallops.  ABDOMEN: Soft, nondistended, nontender. Bowel sounds present. No organomegaly or mass.  EXTREMITIES: No pedal edema, cyanosis, or clubbing.  NEUROLOGIC: Cranial nerves II through XII are intact. Muscle strength 5/5 in all extremities. Sensation intact. Gait not checked.  PSYCHIATRIC: The patient is alert and oriented x 3.  Normal affect and good eye contact. SKIN: No obvious rash, lesion, or ulcer.   LABORATORY PANEL:   CBC Recent Labs  Lab 04/19/22 1537  WBC 7.6  HGB 13.5  HCT 40.3  PLT 202   ------------------------------------------------------------------------------------------------------------------  Chemistries  Recent Labs  Lab 04/18/22 0557 04/19/22 1537  NA 135 138  K 4.1 3.8  CL 104 105  CO2 23 26  GLUCOSE 307* 136*  BUN 24* 28*  CREATININE 0.92 0.95  CALCIUM 8.3* 8.9  AST 21  --   ALT 25  --   ALKPHOS 48  --   BILITOT 0.8  --    ------------------------------------------------------------------------------------------------------------------  Cardiac Enzymes No results for input(s): "TROPONINI" in the last 168 hours. ------------------------------------------------------------------------------------------------------------------  RADIOLOGY:  CT CHEST WO CONTRAST  Result Date: 04/19/2022 CLINICAL DATA:  Cough. EXAM: CT CHEST WITHOUT CONTRAST TECHNIQUE: Multidetector CT imaging of the chest was performed following the standard protocol without IV contrast. RADIATION DOSE REDUCTION: This exam was performed according to the departmental dose-optimization program which includes automated exposure control, adjustment of the mA and/or kV according to patient size  and/or use of iterative reconstruction technique. COMPARISON:  Chest radiograph dated 04/19/2022. FINDINGS: Evaluation of this exam is limited in the absence of intravenous contrast. Cardiovascular: There is no cardiomegaly or pericardial effusion. Two vessel coronary vascular calcification involving LAD and RCA. Mild atherosclerotic calcification of the thoracic aorta. No aneurysmal dilatation. Mildly dilated main pulmonary trunk suggestive of pulmonary hypertension. Clinical correlation is recommended. Mediastinum/Nodes: No hilar or mediastinal adenopathy. The esophagus is grossly unremarkable. No mediastinal fluid collection. Lungs/Pleura: Mild centrilobular emphysema. No focal consolidation, pleural effusion, or pneumothorax. The central airways are patent. Upper Abdomen: Fatty liver.  Colonic diverticulosis. Musculoskeletal: Osteopenia with degenerative changes of the spine. No acute osseous pathology. IMPRESSION: 1. No acute intrathoracic  pathology. 2. Mildly dilated main pulmonary trunk suggestive of pulmonary hypertension. 3. Fatty liver. 4. Colonic diverticulosis. 5. Aortic Atherosclerosis (ICD10-I70.0) and Emphysema (ICD10-J43.9). Electronically Signed   By: Anner Crete M.D.   On: 04/19/2022 19:34   DG Chest 2 View  Result Date: 04/19/2022 CLINICAL DATA:  Shortness of breath EXAM: CHEST - 2 VIEW COMPARISON:  04/17/2022 FINDINGS: Stable cardiomediastinal contours. Persistent elevation of the left hemidiaphragm. No focal airspace consolidation, pleural effusion, or pneumothorax. IMPRESSION: No active cardiopulmonary disease. Electronically Signed   By: Davina Poke D.O.   On: 04/19/2022 15:16      IMPRESSION AND PLAN:  Assessment and Plan: * COPD exacerbation ([place]) - The patient will be admitted to an observation medically monitored bed. - We will place the patient IV steroid therapy with IV Solu-Medrol as well as nebulized bronchodilator therapy with duonebs q.i.d. and q.4 hours  p.r.n.Marland Kitchen - Mucolytic therapy will be provided with Mucinex and antibiotic therapy with IV Rocephin. - O2 protocol will be followed.    Essential hypertension - We will continue her antihypertensives.  Paroxysmal atrial fibrillation ([facility]) - We will continue Eliquis and Toprol-XL.  Type 2 diabetes mellitus without complications ([facility]) - We will continue Tradjenta and hold off metformin.   DVT prophylaxis: Eliquis. Advanced Care Planning:  Code Status: full code.  Family Communication:  The plan of care was discussed in details with the patient (and family). I answered all questions. The patient agreed to proceed with the above mentioned plan. Further management will depend upon hospital course. Disposition Plan: Back to previous home environment Consults called: none.  All the records are reviewed and case discussed with ED provider.  Status is: Observation  I certify that at the time of admission, it is my clinical judgment that the patient will require hospital care extending less than 2 midnights.                            Dispo: The patient is from: Home              Anticipated d/c is to: Home              Patient currently is not medically stable to d/c.              Difficult to place patient: No  Christel Mormon M.D on 04/19/2022 at 10:16 PM  [facility]   From 7 PM-7 AM, contact night-coverage [email or web address]  CC: Primary care physician; Dewayne Shorter, MD

## 2022-04-19 NOTE — Assessment & Plan Note (Signed)
-   We will continue her antihypertensives. 

## 2022-04-19 NOTE — ED Provider Notes (Signed)
[facility] Provider Note    Event Date/Time   First MD Initiated Contact with Patient 04/19/22 1528     (approximate)   History   Shortness of Breath   HPI  [name] is a 89 y.o. female with recent admission to the hospital and discharged yesterday for shortness of breath acute bronchitis with RSV.  Patient was given nebulizer treatments while in hospital with improvement in symptoms.  Was discharged yesterday having worsening wheezing but does not have prescription for nebulizers.  She denies any pain no nausea or vomiting.  Family is concerned that the patient does not drink enough fluid.     Physical Exam   Triage Vital Signs: ED Triage Vitals  Enc Vitals Group     BP 04/19/22 1446 (!) 151/74     Pulse Rate 04/19/22 1446 74     Resp 04/19/22 1446 20     Temp 04/19/22 1446 98 F (36.7 C)     Temp Source 04/19/22 1446 Oral     SpO2 04/19/22 1446 93 %     Weight 04/19/22 1447 174 lb 6.1 oz (79.1 kg)     Height 04/19/22 1447 5' (1.524 m)     Head Circumference --      Peak Flow --      Pain Score 04/19/22 1447 4     Pain Loc --      Pain Edu? --      Excl. in [place]? --     Most recent vital signs: Vitals:   04/19/22 1446  BP: (!) 151/74  Pulse: 74  Resp: 20  Temp: 98 F (36.7 C)  SpO2: 93%     Constitutional: Alert  Eyes: Conjunctivae are normal.  Head: Atraumatic. Nose: No congestion/rhinnorhea. Mouth/Throat: Mucous membranes are moist.   Neck: Painless ROM.  Cardiovascular:   Good peripheral circulation. Respiratory: Diffuse expiratory wheeze and bronchitic sounding cough. Gastrointestinal: Soft and nontender.  Musculoskeletal:  no deformity Neurologic:  MAE spontaneously. No gross focal neurologic deficits are appreciated.  Skin:  Skin is warm, dry and intact. No rash noted. Psychiatric: Mood and affect are normal. Speech and behavior are normal.    ED Results / Procedures / Treatments   Labs (all labs ordered  are listed, but only abnormal results are displayed) Labs Reviewed  BASIC METABOLIC PANEL - Abnormal; Notable for the following components:      Result Value   Glucose, Bld 136 (*)    BUN 28 (*)    GFR, Estimated 58 (*)    All other components within normal limits  CBG MONITORING, ED - Abnormal; Notable for the following components:   Glucose-Capillary 125 (*)    All other components within normal limits  CBC WITH DIFFERENTIAL/PLATELET     EKG     RADIOLOGY Please see ED Course for my review and interpretation.  I personally reviewed all radiographic images ordered to evaluate for the above acute complaints and reviewed radiology reports and findings.  These findings were personally discussed with the patient.  Please see medical record for radiology report.    PROCEDURES:  Critical Care performed: No  Procedures   MEDICATIONS ORDERED IN ED: Medications  albuterol (PROVENTIL) (2.5 MG/3ML) 0.083% nebulizer solution 2.5 mg (has no administration in time range)  methylPREDNISolone sodium succinate (SOLU-MEDROL) 125 mg/2 mL injection 60 mg (has no administration in time range)  ipratropium-albuterol (DUONEB) 0.5-2.5 (3) MG/3ML nebulizer solution 3 mL (3 mLs Nebulization Given 04/19/22 1534)  ipratropium-albuterol (DUONEB) 0.5-2.5 (3) MG/3ML nebulizer solution 3 mL (3 mLs Nebulization Given 04/19/22 1650)     IMPRESSION / MDM / ASSESSMENT AND PLAN / ED COURSE  I reviewed the triage vital signs and the nursing notes.                              Differential diagnosis includes, but is not limited to, Asthma, copd, CHF, pna, ptx, malignancy, Pe, anemia  Patient presenting to the ER for evaluation of symptoms as described above.  Based on symptoms, risk factors and considered above differential, this presenting complaint could reflect a potentially life-threatening illness therefore the patient will be placed on continuous pulse oximetry and telemetry for monitoring.   Laboratory evaluation will be sent to evaluate for the above complaints.  Patient clinically does have an impressive amount of wheezing.  O2 sats are borderline low but not hypoxic.  Mild increased work of breathing but she is speaking in appropriate phrases.  Will give nebulizer.  There is some hesitation with steroids given her diabetes previously.   Clinical Course as of 04/19/22 1939  Sat Apr 19, 2022  1556 This x-ray my review and interpretation without evidence of consolidation or pneumothorax. [PR]  1916 Now with recurrent wheezing now receiving her third nebulizer treatment.  Feels like she will get some brief improvement and then gets worse again.  This point we will give nebulizer.  Though she is not hypoxic she is tachypneic feels very dyspneic with ambulation.  Still awaiting CT but I have a high suspicion she has underlying COPD will need additional steroids.  Based on her age risk factors will consult hospitalist for admission. [PR]    Clinical Course User Index [PR] Merlyn Lot, MD      FINAL CLINICAL IMPRESSION(S) / ED DIAGNOSES   Final diagnoses:  Centrilobular emphysema ([place])  Wheezing     Rx / DC Orders   ED Discharge Orders     None        Note:  This document was prepared using Dragon voice recognition software and may include unintentional dictation errors.    Merlyn Lot, MD 04/19/22 [phone]

## 2022-04-19 NOTE — Assessment & Plan Note (Signed)
-   We will continue Tradjenta and hold off metformin.

## 2022-04-19 NOTE — ED Notes (Signed)
First nurse note: [facility] called to inform pt on way and is RSV +. Daughter brought pt in [place]. Pt appears to have unlabored respirations in lobby.

## 2022-04-19 NOTE — Assessment & Plan Note (Signed)
-   We will continue Eliquis and Toprol-XL.

## 2022-04-19 NOTE — Assessment & Plan Note (Addendum)
-   The patient will be admitted to an observation medically monitored bed. - We will place the patient IV steroid therapy with IV Solu-Medrol as well as nebulized bronchodilator therapy with duonebs q.i.d. and q.4 hours p.r.n.Marland Kitchen - Mucolytic therapy will be provided with Mucinex and antibiotic therapy with IV Rocephin. - O2 protocol will be followed.

## 2022-04-19 NOTE — ED Triage Notes (Signed)
Pt presents to the ED via POV due to same complaint on 04/17/22. Pt's daughter states [facility] c/o increasing wheezing and SOB. Pt has audible wheezing in triage. Daughter at bedside

## 2022-04-19 NOTE — ED Notes (Signed)
Patient transported back from CT 

## 2022-04-20 ENCOUNTER — Other Ambulatory Visit: Payer: Self-pay

## 2022-04-20 DIAGNOSIS — T380X5A Adverse effect of glucocorticoids and synthetic analogues, initial encounter: Secondary | ICD-10-CM | POA: Diagnosis present

## 2022-04-20 DIAGNOSIS — E669 Obesity, unspecified: Secondary | ICD-10-CM | POA: Diagnosis present

## 2022-04-20 DIAGNOSIS — J441 Chronic obstructive pulmonary disease with (acute) exacerbation: Secondary | ICD-10-CM | POA: Diagnosis not present

## 2022-04-20 DIAGNOSIS — Z6834 Body mass index (BMI) 34.0-34.9, adult: Secondary | ICD-10-CM | POA: Diagnosis not present

## 2022-04-20 DIAGNOSIS — I10 Essential (primary) hypertension: Secondary | ICD-10-CM | POA: Diagnosis present

## 2022-04-20 DIAGNOSIS — Z833 Family history of diabetes mellitus: Secondary | ICD-10-CM | POA: Diagnosis not present

## 2022-04-20 DIAGNOSIS — F0390 Unspecified dementia without behavioral disturbance: Secondary | ICD-10-CM | POA: Diagnosis present

## 2022-04-20 DIAGNOSIS — Z66 Do not resuscitate: Secondary | ICD-10-CM | POA: Diagnosis present

## 2022-04-20 DIAGNOSIS — Z5320 Procedure and treatment not carried out because of patient's decision for unspecified reasons: Secondary | ICD-10-CM | POA: Diagnosis present

## 2022-04-20 DIAGNOSIS — J432 Centrilobular emphysema: Secondary | ICD-10-CM | POA: Diagnosis present

## 2022-04-20 DIAGNOSIS — I480 Paroxysmal atrial fibrillation: Secondary | ICD-10-CM | POA: Diagnosis present

## 2022-04-20 DIAGNOSIS — R062 Wheezing: Secondary | ICD-10-CM | POA: Diagnosis present

## 2022-04-20 DIAGNOSIS — J9811 Atelectasis: Secondary | ICD-10-CM | POA: Diagnosis present

## 2022-04-20 DIAGNOSIS — E1159 Type 2 diabetes mellitus with other circulatory complications: Secondary | ICD-10-CM | POA: Diagnosis present

## 2022-04-20 DIAGNOSIS — Z801 Family history of malignant neoplasm of trachea, bronchus and lung: Secondary | ICD-10-CM | POA: Diagnosis not present

## 2022-04-20 DIAGNOSIS — E1165 Type 2 diabetes mellitus with hyperglycemia: Secondary | ICD-10-CM | POA: Diagnosis present

## 2022-04-20 DIAGNOSIS — Z8249 Family history of ischemic heart disease and other diseases of the circulatory system: Secondary | ICD-10-CM | POA: Diagnosis not present

## 2022-04-20 DIAGNOSIS — Z85828 Personal history of other malignant neoplasm of skin: Secondary | ICD-10-CM | POA: Diagnosis not present

## 2022-04-20 DIAGNOSIS — Z79899 Other long term (current) drug therapy: Secondary | ICD-10-CM | POA: Diagnosis not present

## 2022-04-20 DIAGNOSIS — E785 Hyperlipidemia, unspecified: Secondary | ICD-10-CM | POA: Diagnosis present

## 2022-04-20 DIAGNOSIS — Z1152 Encounter for screening for COVID-19: Secondary | ICD-10-CM | POA: Diagnosis not present

## 2022-04-20 DIAGNOSIS — Z7901 Long term (current) use of anticoagulants: Secondary | ICD-10-CM | POA: Diagnosis not present

## 2022-04-20 DIAGNOSIS — Z7951 Long term (current) use of inhaled steroids: Secondary | ICD-10-CM | POA: Diagnosis not present

## 2022-04-20 DIAGNOSIS — B974 Respiratory syncytial virus as the cause of diseases classified elsewhere: Secondary | ICD-10-CM | POA: Diagnosis present

## 2022-04-20 DIAGNOSIS — Z87891 Personal history of nicotine dependence: Secondary | ICD-10-CM | POA: Diagnosis not present

## 2022-04-20 LAB — BASIC METABOLIC PANEL
Anion gap: 9 (ref 5–15)
BUN: 26 mg/dL — ABNORMAL HIGH (ref 8–23)
CO2: 22 mmol/L (ref 22–32)
Calcium: 8.3 mg/dL — ABNORMAL LOW (ref 8.9–10.3)
Chloride: 105 mmol/L (ref 98–111)
Creatinine, Ser: 0.88 mg/dL (ref 0.44–1.00)
GFR, Estimated: >60 mL/min (ref >60)
Glucose, Bld: 411 mg/dL — ABNORMAL HIGH (ref 70–99)
Potassium: 4.4 mmol/L (ref 3.5–5.1)
Sodium: 136 mmol/L (ref 135–145)

## 2022-04-20 LAB — CBC
HCT: 37.4 % (ref 36.0–46.0)
Hemoglobin: 12.3 g/dL (ref 12.0–15.0)
MCH: 29.4 pg (ref 26.0–34.0)
MCHC: 32.9 g/dL (ref 30.0–36.0)
MCV: 89.3 fL (ref 80.0–100.0)
Platelets: 199 10*3/uL (ref 150–400)
RBC: 4.19 MIL/uL (ref 3.87–5.11)
RDW: 12.2 % (ref 11.5–15.5)
WBC: 9.6 10*3/uL (ref 4.0–10.5)
nRBC: 0 % (ref 0.0–0.2)

## 2022-04-20 LAB — GLUCOSE, CAPILLARY
Glucose-Capillary: 275 mg/dL — ABNORMAL HIGH (ref 70–99)
Glucose-Capillary: 425 mg/dL — ABNORMAL HIGH (ref 70–99)

## 2022-04-20 LAB — CBG MONITORING, ED
Glucose-Capillary: 351 mg/dL — ABNORMAL HIGH (ref 70–99)
Glucose-Capillary: 329 mg/dL — ABNORMAL HIGH (ref 70–99)

## 2022-04-20 MED ORDER — INSULIN ASPART 100 UNIT/ML IJ SOLN
8.0000 [IU] | Freq: Once | INTRAMUSCULAR | Status: AC
  Administered 2022-04-20: 8 [IU] via SUBCUTANEOUS
  Filled 2022-04-20: qty 1

## 2022-04-20 MED ORDER — ARFORMOTEROL TARTRATE 15 MCG/2ML IN NEBU
15.0000 ug | INHALATION_SOLUTION | Freq: Two times a day (BID) | RESPIRATORY_TRACT | Status: DC
  Administered 2022-04-20 – 2022-04-22 (×4): 15 ug via RESPIRATORY_TRACT
  Filled 2022-04-20 (×6): qty 2

## 2022-04-20 MED ORDER — BUDESONIDE 0.25 MG/2ML IN SUSP
0.2500 mg | Freq: Two times a day (BID) | RESPIRATORY_TRACT | Status: DC
  Administered 2022-04-20 – 2022-04-22 (×5): 0.25 mg via RESPIRATORY_TRACT
  Filled 2022-04-20 (×6): qty 2

## 2022-04-20 NOTE — Progress Notes (Signed)
       CROSS COVER NOTE  NAME: [name] MRN: [id number] DOB : November 13, 1931 ATTENDING PHYSICIAN: [name] Ace, MD    Date of Service   04/20/2022   HPI/Events of Note   Notified of elevated CBG 425 with no HS insulin ordered. M(r)s [name] also receiving steroids inpatient.  Interventions   Assessment/Plan:  8U Novolog      To reach the provider On-Call:   7AM- 7PM see care teams to locate the attending and reach out to them via [email or web address] Password: [email or web address] 7PM-7AM contact night-coverage If you still have difficulty reaching the appropriate provider, please page the [facility] (Director on Call) for [facility] on amion for assistance  This document was prepared using Systems analyst and may include unintentional dictation errors.  [name] Glass DNP, MBA, FNP-BC, PMHNP-BC Nurse Practitioner [facility] Pager [phone]

## 2022-04-20 NOTE — Progress Notes (Signed)
PROGRESS NOTE    [name]  [id number] DOB: [birth year] DOA: 04/19/2022 PCP: Dewayne Shorter, MD    Brief Narrative:  89 y.o. Caucasian female with medical history significant for dementia, hypertension, type 2 diabetes mellitus, paroxysmal atrial fibrillation on Eliquis and dyslipidemia, who presented to the emergency room with acute onset of worsening dyspnea with associated productive cough and wheezing.  She was just discharged home yesterday after being managed here for RSV infection for a day.  She was treated with Decadron and PR and albuterol.  She became hyper glycemic on Decadron and refused insulin but continued on her glipizide.  No fever or chills.  No chest pain or palpitations.  No nausea or vomiting or abdominal pain.  No dysuria, oliguria or hematuria or flank pain.    Assessment & Plan:   Principal Problem:   COPD exacerbation ([place]) Active Problems:   Paroxysmal atrial fibrillation (HCC)   Essential hypertension   Type 2 diabetes mellitus without complications (HCC)   COPD exacerbation (HCC) Suspected COPD exacerbation was driven by recent viral syndrome.  Patient on 2 L at time of my evaluation.  Has some wheezing with normal work of breathing. Plan: IV steroids for today, followed by p.o. prednisone DuoNebs every 4 hours Twice daily Brovana Twice daily Pulmicort Wean oxygen as tolerated   Essential hypertension Blood pressure controlled.  Continue current therapy   Paroxysmal atrial fibrillation (HCC) Rate controlled.  Continue Toprol and Eliquis   Type 2 diabetes mellitus without complications ([place]) Tradjenta resumed Glipizide held Carb modified diet with sliding scale coverage   DVT prophylaxis: Eliquis Code Status: DNR Family Communication: Daughter Neoma Laming [phone] on 2/11 Disposition Plan: Status is: Inpatient Remains inpatient appropriate because: COPD exacerbation, oxygen requirement still wheezing.  Anticipate discharge in 24  hours.   Level of care: Telemetry Medical  Consultants:  None  Procedures:  None  Antimicrobials: None   Subjective: Seen and examined.  Resting comfortably in bed.  No visible distress.  Reports shortness of breath is improving.  Objective: Vitals:   04/20/22 0619 04/20/22 1052 04/20/22 1200 04/20/22 1259  BP: (!) 151/76 136/73 (!) 151/67   Pulse: 68 82  79  Resp: 19     Temp:      TempSrc:      SpO2: 98%   99%  Weight:      Height:        Intake/Output Summary (Last 24 hours) at 04/20/2022 1325 Last data filed at 04/19/2022 2149 Gross per 24 hour  Intake 100 ml  Output --  Net 100 ml   Filed Weights   04/19/22 1447  Weight: 79.1 kg    Examination:  General exam: Appears calm and comfortable  Respiratory system: Diffuse end expiratory wheeze.  Normal work of breathing.  2 L Cardiovascular system: S1-S2, regular rate, irregular rhythm, no murmurs, no pedal edema Gastrointestinal system: Soft, NT/ND, normal bowel sounds Central nervous system: Alert and oriented. No focal neurological deficits. Extremities: Symmetric 5 x 5 power. Skin: No rashes, lesions or ulcers Psychiatry: Judgement and insight appear normal. Mood & affect appropriate.     Data Reviewed: I have personally reviewed following labs and imaging studies  CBC: Recent Labs  Lab 04/17/22 2020 04/18/22 0557 04/19/22 1537 04/20/22 0503  WBC 8.0 6.4 7.6 9.6  NEUTROABS  --  5.1 3.1  --   HGB 13.7 13.1 13.5 12.3  HCT 41.3 40.0 40.3 37.4  MCV 89.2 89.7 87.8 89.3  PLT 207 191 202 199  Basic Metabolic Panel: Recent Labs  Lab 04/17/22 2020 04/18/22 0557 04/19/22 1537 04/20/22 0503  NA 132* 135 138 136  K 3.8 4.1 3.8 4.4  CL 98 104 105 105  CO2 21* 23 26 22  $ GLUCOSE 263* 307* 136* 411*  BUN 18 24* 28* 26*  CREATININE 0.80 0.92 0.95 0.88  CALCIUM 9.1 8.3* 8.9 8.3*   GFR: Estimated Creatinine Clearance: 41.1 mL/min (by C-G formula based on SCr of 0.88 mg/dL). Liver Function  Tests: Recent Labs  Lab 04/17/22 2020 04/18/22 0557  AST 24 21  ALT 26 25  ALKPHOS 57 48  BILITOT 1.0 0.8  PROT 7.7 6.7  ALBUMIN 4.2 3.4*   No results for input(s): "LIPASE", "AMYLASE" in the last 168 hours. No results for input(s): "AMMONIA" in the last 168 hours. Coagulation Profile: No results for input(s): "INR", "PROTIME" in the last 168 hours. Cardiac Enzymes: No results for input(s): "CKTOTAL", "CKMB", "CKMBINDEX", "TROPONINI" in the last 168 hours. BNP (last 3 results) No results for input(s): "PROBNP" in the last 8760 hours. HbA1C: Recent Labs    04/17/22 2020  HGBA1C 8.3*   CBG: Recent Labs  Lab 04/18/22 0834 04/18/22 1115 04/19/22 1555 04/20/22 0757 04/20/22 1155  GLUCAP 331* 370* 125* 351* 329*   Lipid Profile: No results for input(s): "CHOL", "HDL", "LDLCALC", "TRIG", "CHOLHDL", "LDLDIRECT" in the last 72 hours. Thyroid Function Tests: Recent Labs    04/17/22 2020  TSH 2.284   Anemia Panel: No results for input(s): "VITAMINB12", "FOLATE", "FERRITIN", "TIBC", "IRON", "RETICCTPCT" in the last 72 hours. Sepsis Labs: Recent Labs  Lab 04/17/22 2020 04/17/22 2246  LATICACIDVEN 1.4 1.2    Recent Results (from the past 240 hour(s))  Blood culture (routine x 2)     Status: None (Preliminary result)   Collection Time: 04/17/22  8:20 PM   Specimen: BLOOD  Result Value Ref Range Status   Specimen Description BLOOD LEFT AC  Final   Special Requests   Final    BOTTLES DRAWN AEROBIC AND ANAEROBIC Blood Culture results may not be optimal due to an excessive volume of blood received in culture bottles   Culture   Final    NO GROWTH 3 DAYS Performed at [facility], [address] [place], [place] [address]    Report Status PENDING  Incomplete  Resp panel by RT-PCR (RSV, Flu A&B, Covid) Anterior Nasal Swab     Status: Abnormal   Collection Time: 04/17/22  8:20 PM   Specimen: Anterior Nasal Swab  Result Value Ref Range Status   SARS  Coronavirus 2 by RT PCR NEGATIVE NEGATIVE Final    Comment: (NOTE) SARS-CoV-2 target nucleic acids are NOT DETECTED.  The SARS-CoV-2 RNA is generally detectable in upper respiratory specimens during the acute phase of infection. The lowest concentration of SARS-CoV-2 viral copies this assay can detect is 138 copies/mL. A negative result does not preclude SARS-Cov-2 infection and should not be used as the sole basis for treatment or other patient management decisions. A negative result may occur with  improper specimen collection/handling, submission of specimen other than nasopharyngeal swab, presence of viral mutation(s) within the areas targeted by this assay, and inadequate number of viral copies(<138 copies/mL). A negative result must be combined with clinical observations, patient history, and epidemiological information. The expected result is Negative.  Fact Sheet for Patients:  EntrepreneurPulse.com.au  Fact Sheet for Healthcare Providers:  IncredibleEmployment.be  This test is no t yet approved or cleared by the [place] and  has been authorized for detection and/or diagnosis of SARS-CoV-2 by FDA under an Emergency Use Authorization (EUA). This EUA will remain  in effect (meaning this test can be used) for the duration of the COVID-19 declaration under Section 564(b)(1) of the Act, 21 U.S.C.section 360bbb-3(b)(1), unless the authorization is terminated  or revoked sooner.       Influenza A by PCR NEGATIVE NEGATIVE Final   Influenza B by PCR NEGATIVE NEGATIVE Final    Comment: (NOTE) The Xpert Xpress SARS-CoV-2/FLU/RSV plus assay is intended as an aid in the diagnosis of influenza from Nasopharyngeal swab specimens and should not be used as a sole basis for treatment. Nasal washings and aspirates are unacceptable for Xpert Xpress SARS-CoV-2/FLU/RSV testing.  Fact Sheet for  Patients: EntrepreneurPulse.com.au  Fact Sheet for Healthcare Providers: IncredibleEmployment.be  This test is not yet approved or cleared by the [place] FDA and has been authorized for detection and/or diagnosis of SARS-CoV-2 by FDA under an Emergency Use Authorization (EUA). This EUA will remain in effect (meaning this test can be used) for the duration of the COVID-19 declaration under Section 564(b)(1) of the Act, 21 U.S.C. section 360bbb-3(b)(1), unless the authorization is terminated or revoked.     Resp Syncytial Virus by PCR POSITIVE (A) NEGATIVE Final    Comment: (NOTE) Fact Sheet for Patients: EntrepreneurPulse.com.au  Fact Sheet for Healthcare Providers: IncredibleEmployment.be  This test is not yet approved or cleared by the [place] FDA and has been authorized for detection and/or diagnosis of SARS-CoV-2 by FDA under an Emergency Use Authorization (EUA). This EUA will remain in effect (meaning this test can be used) for the duration of the COVID-19 declaration under Section 564(b)(1) of the Act, 21 U.S.C. section 360bbb-3(b)(1), unless the authorization is terminated or revoked.  Performed at [facility], [address] [place], [place] [address]   Blood culture (routine x 2)     Status: None (Preliminary result)   Collection Time: 04/17/22  9:27 PM   Specimen: BLOOD  Result Value Ref Range Status   Specimen Description BLOOD RIGHT FA  Final   Special Requests   Final    BOTTLES DRAWN AEROBIC AND ANAEROBIC Blood Culture adequate volume   Culture   Final    NO GROWTH 3 DAYS Performed at [facility], [address], [place], [place] [address]    Report Status PENDING  Incomplete         Radiology Studies: CT CHEST WO CONTRAST  Result Date: 04/19/2022 CLINICAL DATA:  Cough. EXAM: CT CHEST WITHOUT CONTRAST TECHNIQUE: Multidetector CT imaging of the  chest was performed following the standard protocol without IV contrast. RADIATION DOSE REDUCTION: This exam was performed according to the departmental dose-optimization program which includes automated exposure control, adjustment of the mA and/or kV according to patient size and/or use of iterative reconstruction technique. COMPARISON:  Chest radiograph dated 04/19/2022. FINDINGS: Evaluation of this exam is limited in the absence of intravenous contrast. Cardiovascular: There is no cardiomegaly or pericardial effusion. Two vessel coronary vascular calcification involving LAD and RCA. Mild atherosclerotic calcification of the thoracic aorta. No aneurysmal dilatation. Mildly dilated main pulmonary trunk suggestive of pulmonary hypertension. Clinical correlation is recommended. Mediastinum/Nodes: No hilar or mediastinal adenopathy. The esophagus is grossly unremarkable. No mediastinal fluid collection. Lungs/Pleura: Mild centrilobular emphysema. No focal consolidation, pleural effusion, or pneumothorax. The central airways are patent. Upper Abdomen: Fatty liver.  Colonic diverticulosis. Musculoskeletal: Osteopenia with degenerative changes of the spine. No acute osseous pathology. IMPRESSION: 1.  No acute intrathoracic pathology. 2. Mildly dilated main pulmonary trunk suggestive of pulmonary hypertension. 3. Fatty liver. 4. Colonic diverticulosis. 5. Aortic Atherosclerosis (ICD10-I70.0) and Emphysema (ICD10-J43.9). Electronically Signed   By: Anner Crete M.D.   On: 04/19/2022 19:34   DG Chest 2 View  Result Date: 04/19/2022 CLINICAL DATA:  Shortness of breath EXAM: CHEST - 2 VIEW COMPARISON:  04/17/2022 FINDINGS: Stable cardiomediastinal contours. Persistent elevation of the left hemidiaphragm. No focal airspace consolidation, pleural effusion, or pneumothorax. IMPRESSION: No active cardiopulmonary disease. Electronically Signed   By: Davina Poke D.O.   On: 04/19/2022 15:16        Scheduled  Meds:  apixaban  5 mg Oral BID   arformoterol  15 mcg Nebulization BID   budesonide (PULMICORT) nebulizer solution  0.25 mg Nebulization BID   glipiZIDE  10 mg Oral Q breakfast   guaiFENesin  600 mg Oral BID   insulin aspart  0-15 Units Subcutaneous TID WC   ipratropium-albuterol  3 mL Nebulization QID   linagliptin  5 mg Oral Daily   lisinopril  20 mg Oral Daily   metoprolol succinate  25 mg Oral Daily   multivitamin with minerals  1 tablet Oral Daily   [START ON 04/21/2022] predniSONE  40 mg Oral Q breakfast   Continuous Infusions:   LOS: 0 days      Sidney Ace, MD [facility]   If 7PM-7AM, please contact night-coverage  04/20/2022, 1:25 PM

## 2022-04-21 ENCOUNTER — Encounter: Payer: Medicare Other | Admitting: Student

## 2022-04-21 DIAGNOSIS — J441 Chronic obstructive pulmonary disease with (acute) exacerbation: Secondary | ICD-10-CM | POA: Diagnosis not present

## 2022-04-21 LAB — GLUCOSE, CAPILLARY
Glucose-Capillary: 261 mg/dL — ABNORMAL HIGH (ref 70–99)
Glucose-Capillary: 202 mg/dL — ABNORMAL HIGH (ref 70–99)
Glucose-Capillary: 321 mg/dL — ABNORMAL HIGH (ref 70–99)
Glucose-Capillary: 343 mg/dL — ABNORMAL HIGH (ref 70–99)

## 2022-04-21 MED ORDER — INSULIN ASPART 100 UNIT/ML IJ SOLN
0.0000 [IU] | Freq: Every day | INTRAMUSCULAR | Status: DC
  Administered 2022-04-21: 4 [IU] via SUBCUTANEOUS
  Filled 2022-04-21: qty 1

## 2022-04-21 MED ORDER — GUAIFENESIN-DM 100-10 MG/5ML PO SYRP: 5.0000 mL | ORAL_SOLUTION | ORAL | Status: DC | PRN

## 2022-04-21 MED ORDER — INSULIN GLARGINE-YFGN 100 UNIT/ML ~~LOC~~ SOLN
5.0000 [IU] | Freq: Every day | SUBCUTANEOUS | Status: DC
  Administered 2022-04-21 – 2022-04-22 (×2): 5 [IU] via SUBCUTANEOUS
  Filled 2022-04-21 (×2): qty 0.05

## 2022-04-21 MED ORDER — INSULIN ASPART 100 UNIT/ML IJ SOLN
0.0000 [IU] | Freq: Three times a day (TID) | INTRAMUSCULAR | Status: DC
  Administered 2022-04-21: 11 [IU] via SUBCUTANEOUS
  Administered 2022-04-21: 5 [IU] via SUBCUTANEOUS
  Administered 2022-04-22: 3 [IU] via SUBCUTANEOUS
  Filled 2022-04-21 (×3): qty 1

## 2022-04-21 NOTE — Inpatient Diabetes Management (Signed)
Inpatient Diabetes Program Recommendations  AACE/ADA: New Consensus Statement on Inpatient Glycemic Control (2015)  Target Ranges:  Prepandial:   less than 140 mg/dL      Peak postprandial:   less than 180 mg/dL (1-2 hours)      Critically ill patients:  140 - 180 mg/dL    Latest Reference Range & Units 04/17/22 20:20  Hemoglobin A1C 4.8 - 5.6 % 8.3 (H)  (H): Data is abnormally high  Latest Reference Range & Units 04/20/22 07:57 04/20/22 11:55 04/20/22 17:02 04/20/22 19:44  Glucose-Capillary 70 - 99 mg/dL  60 mg Solumedrol 2/10 @1941$   40 mg Solumedrol 2/10 @2119$  351 (H)  15 units Novolog  40 mg Solumedrol @0857$  329 (H)  11 units Novolog 275 (H)  8 units Novolog 425 (H)  (H): Data is abnormally high  Latest Reference Range & Units 04/21/22 07:39  Glucose-Capillary 70 - 99 mg/dL 261 (H)     Admit with: SOB/ COPD (was just admitted 2/8 thru 2/9 with SOB and +RSV)  History: DM, Dementia  Home DM Meds: Glipizide 10 mg daily        Tradjenta 5 mg daily        Metformin 500 mg BID  Current Orders: Semglee 5 units daily   Novolog Moderate Correction Scale/ SSI (0-15 units) TID AC + HS      Tradjenta 5 mg daily    Note pt switched to PO Prednisone today.  Note Semglee added this AM as well  Agree with addition of the Semglee  Will follow    --Will follow patient during hospitalization--  Wyn Quaker RN, MSN, [place] Diabetes Coordinator Inpatient Glycemic Control Team Team Pager: [phone] (8a-5p)

## 2022-04-21 NOTE — Evaluation (Signed)
Occupational Therapy Evaluation Patient Details Name: [name] MRN: [id number] DOB: Jan 12, 1932 Today's Date: 04/21/2022   History of Present Illness 89 y.o. Caucasian female with medical history significant for dementia, hypertension, type 2 diabetes mellitus, paroxysmal atrial fibrillation on Eliquis and dyslipidemia, who presented to the emergency room with acute onset of worsening dyspnea with associated productive cough and wheezing.  She was discharged home 1 day ago after being managed here for RSV infection for a day.   Clinical Impression   [name] was seen for OT evaluation this date. Prior to hospital admission, pt was MOD I using rollator. Pt lives at [facility]. Pt currently requires SUPERVISION + RW toilet t/f and standing grooming tasks. SpO2 96% on RA during ~50 ft mobility, denies feeling SOB despite wheezing/coughing. Educated on ECS, will sign off. Upon hospital discharge, recommend no OT follow up.   Recommendations for follow up therapy are one component of a multi-disciplinary discharge planning process, led by the attending physician.  Recommendations may be updated based on patient status, additional functional criteria and insurance authorization.   Follow Up Recommendations  No OT follow up     Assistance Recommended at Discharge Set up Supervision/Assistance  Patient can return home with the following Help with stairs or ramp for entrance    Functional Status Assessment  Patient has had a recent decline in their functional status and demonstrates the ability to make significant improvements in function in a reasonable and predictable amount of time.  Equipment Recommendations  None recommended by OT    Recommendations for Other Services       Precautions / Restrictions Precautions Precautions: Fall Restrictions Weight Bearing Restrictions: No      Mobility Bed Mobility Overal bed mobility: Modified Independent                   Transfers Overall transfer level: Modified independent Equipment used: Rolling walker (2 wheels)                      Balance Overall balance assessment: No apparent balance deficits (not formally assessed)                                         ADL either performed or assessed with clinical judgement   ADL Overall ADL's : Needs assistance/impaired                                       General ADL Comments: SUPERVISION + RW toilet t/f and standing grooming tasks      Pertinent Vitals/Pain Pain Assessment Pain Assessment: No/denies pain     Hand Dominance     Extremity/Trunk Assessment Upper Extremity Assessment Upper Extremity Assessment: Overall WFL for tasks assessed   Lower Extremity Assessment Lower Extremity Assessment: Generalized weakness       Communication Communication Communication: HOH   Cognition Arousal/Alertness: Awake/alert Behavior During Therapy: WFL for tasks assessed/performed Overall Cognitive Status: Within Functional Limits for tasks assessed                                                  Home Living Family/patient expects  to be discharged to:: Assisted living [facility])                             Home Equipment: Rollator (4 wheels)          Prior Functioning/Environment Prior Level of Function : Independent/Modified Independent             Mobility Comments: MOD I using Rollator ADLs Comments: assist for meals/medication        OT Problem List: Decreased range of motion;Decreased activity tolerance;Impaired balance (sitting and/or standing);Decreased strength         OT Goals(Current goals can be found in the care plan section) Acute Rehab OT Goals Patient Stated Goal: go home OT Goal Formulation: With patient Time For Goal Achievement: 05/05/22 Potential to Achieve Goals: Good   AM-PAC OT "6 Clicks" Daily Activity     Outcome  Measure Help from another person eating meals?: None Help from another person taking care of personal grooming?: None Help from another person toileting, which includes using toliet, bedpan, or urinal?: A Little Help from another person bathing (including washing, rinsing, drying)?: A Little Help from another person to put on and taking off regular upper body clothing?: None Help from another person to put on and taking off regular lower body clothing?: A Lot 6 Click Score: 20   End of Session Equipment Utilized During Treatment: Rolling walker (2 wheels)  Activity Tolerance: Patient tolerated treatment well Patient left: in chair;with call bell/phone within reach  OT Visit Diagnosis: Other abnormalities of gait and mobility (R26.89);Muscle weakness (generalized) (M62.81)                Time: [id number] OT Time Calculation (min): 24 min Charges:  OT General Charges $OT Visit: 1 Visit OT Evaluation $OT Eval Low Complexity: 1 Low OT Treatments $Self Care/Home Management : 8-22 mins  Dessie Coma, M.S. OTR/L  04/21/22, 12:46 PM  ascom [phone]

## 2022-04-21 NOTE — Progress Notes (Incomplete)
       CROSS COVER NOTE  NAME: [name] MRN: [id number] DOB : 01/16/1932 ATTENDING PHYSICIAN: Sidney Ace, MD    Date of Service   04/21/2022   HPI/Events of Note     Interventions   Assessment/Plan:  Asymptomatic Hypertension X X X    *** professional thanks      To reach the provider On-Call:   7AM- 7PM see care teams to locate the attending and reach out to them via [email or web address] Password: [email or web address] 7PM-7AM contact night-coverage If you still have difficulty reaching the appropriate provider, please page the [facility] (Director on Call) for [facility] on amion for assistance  This document was prepared using Systems analyst and may include unintentional dictation errors.  Neomia Glass DNP, MBA, FNP-BC, PMHNP-BC Nurse Practitioner [facility] Pager [phone]

## 2022-04-21 NOTE — Evaluation (Signed)
Physical Therapy Evaluation Patient Details Name: [name] MRN: [id number] DOB: [birth year] Today's Date: 04/21/2022  History of Present Illness  Pt is an 89 y.o. caucasian female with medical history significant for dementia, hypertension, type 2 DM, and paroxysmal atrial fibrillation on Eliquis and dyslipidemia who presented to the emergency room with acute onset of worsening dyspnea with associated productive cough and wheezing.  Pt was recently discharged after being managed here for RSV infection.  MD assessment includes COPD exacerbation likely driven by recent viral syndrome.   Clinical Impression  Pt was pleasant and motivated to participate during the session and put forth good effort throughout. Pt found on room air with SpO2 95% and HR 86.  Pt provided with graded activity including sit to stands followed by amb 30 feet and then amb 50 feet with SpO2 dropping to a low of 91% and HR WNL throughout.  Pt was generally steady with gait but ambulated with very slow cadence and with short bilateral step lengths that is not baseline for patient per pt and daughter.  Per daughter pt typically ambulates with more upright posture and increased cadence.  Pt reported subjectively feeling fatigued from this illness with a goal of getting her stamina back.  Pt will benefit from HHPT upon discharge to safely address deficits listed in patient problem list for decreased caregiver assistance and eventual return to PLOF.         Recommendations for follow up therapy are one component of a multi-disciplinary discharge planning process, led by the attending physician.  Recommendations may be updated based on patient status, additional functional criteria and insurance authorization.  Follow Up Recommendations Home health PT      Assistance Recommended at Discharge Intermittent Supervision/Assistance  Patient can return home with the following  A little help with walking and/or transfers;A little  help with bathing/dressing/bathroom;Assistance with cooking/housework;Direct supervision/assist for medications management;Assist for transportation    Equipment Recommendations None recommended by PT  Recommendations for Other Services       Functional Status Assessment Patient has had a recent decline in their functional status and demonstrates the ability to make significant improvements in function in a reasonable and predictable amount of time.     Precautions / Restrictions Precautions Precautions: Fall Restrictions Weight Bearing Restrictions: No      Mobility  Bed Mobility               General bed mobility comments: Not assessed, patient found sitting at EOB    Transfers Overall transfer level: Needs assistance Equipment used: Rolling walker (2 wheels) Transfers: Sit to/from Stand Sit to Stand: Supervision           General transfer comment: Pt with min increased time and effort to come to standing but generally steady with no LOB    Ambulation/Gait Ambulation/Gait assistance: Supervision Gait Distance (Feet): 30 Feet x 1, 50 Feet x 1 Assistive device: Rolling walker (2 wheels) Gait Pattern/deviations: Step-through pattern, Decreased step length - right, Decreased step length - left, Decreased stride length, Trunk flexed Gait velocity: decreased     General Gait Details: Very slow cadence with short bilateral step length and min verbal cues to stay within the RW during turns; no overt LOB  Stairs            Wheelchair Mobility    Modified Rankin (Stroke Patients Only)       Balance Overall balance assessment: Needs assistance   Sitting balance-Leahy Scale: Normal  Standing balance support: Bilateral upper extremity supported, During functional activity Standing balance-Leahy Scale: Good                               Pertinent Vitals/Pain Pain Assessment Pain Assessment: No/denies pain    Home Living  Family/patient expects to be discharged to:: Assisted living                 Home Equipment: Rollator (4 wheels) Additional Comments: Pt lives at [facility]    Prior Function               Mobility Comments: Mod Ind amb facility distances with a rollator, no fall history ADLs Comments: Ind with ADLs, facility staff assists with meals and meds     Hand Dominance        Extremity/Trunk Assessment   Upper Extremity Assessment Upper Extremity Assessment: Generalized weakness    Lower Extremity Assessment Lower Extremity Assessment: Generalized weakness       Communication   Communication: HOH  Cognition Arousal/Alertness: Awake/alert Behavior During Therapy: WFL for tasks assessed/performed Overall Cognitive Status: Within Functional Limits for tasks assessed                                          General Comments      Exercises     Assessment/Plan    PT Assessment Patient needs continued PT services  PT Problem List Decreased activity tolerance;Decreased mobility;Decreased balance;Decreased strength;Cardiopulmonary status limiting activity       PT Treatment Interventions Gait training;Functional mobility training;Therapeutic activities;Patient/family education;Therapeutic exercise;DME instruction;Balance training    PT Goals (Current goals can be found in the Care Plan section)  Acute Rehab PT Goals Patient Stated Goal: To be able to walk better PT Goal Formulation: With patient/family Time For Goal Achievement: 05/04/22 Potential to Achieve Goals: Good    Frequency Min 2X/week     Co-evaluation               AM-PAC PT "6 Clicks" Mobility  Outcome Measure Help needed turning from your back to your side while in a flat bed without using bedrails?: A Little Help needed moving from lying on your back to sitting on the side of a flat bed without using bedrails?: A Little Help needed moving to and from a bed to a  chair (including a wheelchair)?: A Little Help needed standing up from a chair using your arms (e.g., wheelchair or bedside chair)?: A Little Help needed to walk in hospital room?: A Little Help needed climbing 3-5 steps with a railing? : A Little 6 Click Score: 18    End of Session Equipment Utilized During Treatment: Gait belt Activity Tolerance: Patient tolerated treatment well Patient left: in chair;with call bell/phone within reach;with chair alarm set Nurse Communication: Mobility status PT Visit Diagnosis: Difficulty in walking, not elsewhere classified (R26.2);Muscle weakness (generalized) (M62.81)    Time: [id number] PT Time Calculation (min) (ACUTE ONLY): 25 min   Charges:   PT Evaluation $PT Eval Moderate Complexity: 1 Mod PT Treatments $Gait Training: 8-22 mins       D. Royetta Asal PT, DPT 04/21/22, 5:17 PM

## 2022-04-21 NOTE — Progress Notes (Signed)
PROGRESS NOTE    [name]  [id number] DOB: 1931/06/25 DOA: 04/19/2022 PCP: Dewayne Shorter, MD    Brief Narrative:  89 y.o. Caucasian female with medical history significant for dementia, hypertension, type 2 diabetes mellitus, paroxysmal atrial fibrillation on Eliquis and dyslipidemia, who presented to the emergency room with acute onset of worsening dyspnea with associated productive cough and wheezing.  She was just discharged home yesterday after being managed here for RSV infection for a day.  She was treated with Decadron and PR and albuterol.  She became hyper glycemic on Decadron and refused insulin but continued on her glipizide.  No fever or chills.  No chest pain or palpitations.  No nausea or vomiting or abdominal pain.  No dysuria, oliguria or hematuria or flank pain.    Assessment & Plan:   Principal Problem:   COPD exacerbation ([place]) Active Problems:   Paroxysmal atrial fibrillation (HCC)   Essential hypertension   Type 2 diabetes mellitus without complications (HCC)   COPD exacerbation ([facility]) Suspected COPD exacerbation was driven by recent viral syndrome.  Patient on 2 L at time of my evaluation.  Has some wheezing with normal work of breathing. -Oxygen weaned to 1L Plan: PO prednisone 102m qD DuoNebs every 4 hours Twice daily Brovana Twice daily Pulmicort Wean oxygen as tolerated   Essential hypertension Blood pressure controlled.  Continue current therapy   Paroxysmal atrial fibrillation (HCC) Rate controlled.  Continue Toprol and Eliquis   Type 2 diabetes mellitus without complications [place] resumed Glipizide held Carb modified diet with sliding scale coverage Semglee 5 units daily while on steroids   DVT prophylaxis: Eliquis Code Status: DNR Family Communication: Daughter DNeoma [phone] 2/11 Disposition Plan: Status is: Inpatient Remains inpatient appropriate because: COPD exacerbation, oxygen requirement still  wheezing.  Anticipate discharge in 24 hours.   Level of care: Telemetry Medical  Consultants:  None  Procedures:  None  Antimicrobials: None   Subjective: Seen and examined.  Resting in bed.  No visible distress.  Shortness of breath improved  Objective: Vitals:   04/21/22 0359 04/21/22 0726 04/21/22 0747 04/21/22 0800  BP: (!) 163/51  (!) 146/84 136/82  Pulse: (!) 58  71   Resp: 18  18   Temp: 97.6 F (36.4 C)     TempSrc: Oral     SpO2: 96% 98% 96%   Weight:      Height:        Intake/Output Summary (Last 24 hours) at 04/21/2022 0959 Last data filed at 04/21/2022 0252 Gross per 24 hour  Intake --  Output 300 ml  Net -300 ml   Filed Weights   04/19/22 1447  Weight: 79.1 kg    Examination:  General exam: NAD Respiratory system: Scattered end expiratory wheeze.  Normal work of breathing.  1 L Cardiovascular system: S1-S2, regular rate, irregular rhythm, no murmurs, no pedal edema Gastrointestinal system: Soft, NT/ND, normal bowel sounds Central nervous system: Alert and oriented. No focal neurological deficits. Extremities: Symmetric 5 x 5 power. Skin: No rashes, lesions or ulcers Psychiatry: Judgement and insight appear normal. Mood & affect appropriate.     Data Reviewed: I have personally reviewed following labs and imaging studies  CBC: Recent Labs  Lab 04/17/22 2020 04/18/22 0557 04/19/22 1537 04/20/22 0503  WBC 8.0 6.4 7.6 9.6  NEUTROABS  --  5.1 3.1  --   HGB 13.7 13.1 13.5 12.3  HCT 41.3 40.0 40.3 37.4  MCV 89.2 89.7 87.8 89.3  PLT  207 191 202 [id number]   Basic Metabolic Panel: Recent Labs  Lab 04/17/22 2020 04/18/22 0557 04/19/22 1537 04/20/22 0503  NA 132* 135 138 136  K 3.8 4.1 3.8 4.4  CL 98 104 105 105  CO2 21* 23 26 22  $ GLUCOSE 263* 307* 136* 411*  BUN 18 24* 28* 26*  CREATININE 0.80 0.92 0.95 0.88  CALCIUM 9.1 8.3* 8.9 8.3*   GFR: Estimated Creatinine Clearance: 41.1 mL/min (by C-G formula based on SCr of 0.88  mg/dL). Liver Function Tests: Recent Labs  Lab 04/17/22 2020 04/18/22 0557  AST 24 21  ALT 26 25  ALKPHOS 57 48  BILITOT 1.0 0.8  PROT 7.7 6.7  ALBUMIN 4.2 3.4*   No results for input(s): "LIPASE", "AMYLASE" in the last 168 hours. No results for input(s): "AMMONIA" in the last 168 hours. Coagulation Profile: No results for input(s): "INR", "PROTIME" in the last 168 hours. Cardiac Enzymes: No results for input(s): "CKTOTAL", "CKMB", "CKMBINDEX", "TROPONINI" in the last 168 hours. BNP (last 3 results) No results for input(s): "PROBNP" in the last 8760 hours. HbA1C: No results for input(s): "HGBA1C" in the last 72 hours.  CBG: Recent Labs  Lab 04/20/22 0757 04/20/22 1155 04/20/22 1702 04/20/22 1944 04/21/22 0739  GLUCAP 351* 329* 275* 425* 261*   Lipid Profile: No results for input(s): "CHOL", "HDL", "LDLCALC", "TRIG", "CHOLHDL", "LDLDIRECT" in the last 72 hours. Thyroid Function Tests: No results for input(s): "TSH", "T4TOTAL", "FREET4", "T3FREE", "THYROIDAB" in the last 72 hours.  Anemia Panel: No results for input(s): "VITAMINB12", "FOLATE", "FERRITIN", "TIBC", "IRON", "RETICCTPCT" in the last 72 hours. Sepsis Labs: Recent Labs  Lab 04/17/22 2020 04/17/22 2246  LATICACIDVEN 1.4 1.2    Recent Results (from the past 240 hour(s))  Blood culture (routine x 2)     Status: None (Preliminary result)   Collection Time: 04/17/22  8:20 PM   Specimen: BLOOD  Result Value Ref Range Status   Specimen Description BLOOD LEFT AC  Final   Special Requests   Final    BOTTLES DRAWN AEROBIC AND ANAEROBIC Blood Culture results may not be optimal due to an excessive volume of blood received in culture bottles   Culture   Final    NO GROWTH 4 DAYS Performed at [facility], [address] [place], [place] [address]    Report Status PENDING  Incomplete  Resp panel by RT-PCR (RSV, Flu A&B, Covid) Anterior Nasal Swab     Status: Abnormal   Collection Time: 04/17/22   8:20 PM   Specimen: Anterior Nasal Swab  Result Value Ref Range Status   SARS Coronavirus 2 by RT PCR NEGATIVE NEGATIVE Final    Comment: (NOTE) SARS-CoV-2 target nucleic acids are NOT DETECTED.  The SARS-CoV-2 RNA is generally detectable in upper respiratory specimens during the acute phase of infection. The lowest concentration of SARS-CoV-2 viral copies this assay can detect is 138 copies/mL. A negative result does not preclude SARS-Cov-2 infection and should not be used as the sole basis for treatment or other patient management decisions. A negative result may occur with  improper specimen collection/handling, submission of specimen other than nasopharyngeal swab, presence of viral mutation(s) within the areas targeted by this assay, and inadequate number of viral copies(<138 copies/mL). A negative result must be combined with clinical observations, patient history, and epidemiological information. The expected result is Negative.  Fact Sheet for Patients:  EntrepreneurPulse.com.au  Fact Sheet for Healthcare Providers:  IncredibleEmployment.be  This test is no t yet approved  or cleared by the [place] and  has been authorized for detection and/or diagnosis of SARS-CoV-2 by FDA under an Emergency Use Authorization (EUA). This EUA will remain  in effect (meaning this test can be used) for the duration of the COVID-19 declaration under Section 564(b)(1) of the Act, 21 U.S.C.section 360bbb-3(b)(1), unless the authorization is terminated  or revoked sooner.       Influenza A by PCR NEGATIVE NEGATIVE Final   Influenza B by PCR NEGATIVE NEGATIVE Final    Comment: (NOTE) The Xpert Xpress SARS-CoV-2/FLU/RSV plus assay is intended as an aid in the diagnosis of influenza from Nasopharyngeal swab specimens and should not be used as a sole basis for treatment. Nasal washings and aspirates are unacceptable for Xpert Xpress  SARS-CoV-2/FLU/RSV testing.  Fact Sheet for Patients: EntrepreneurPulse.com.au  Fact Sheet for Healthcare Providers: IncredibleEmployment.be  This test is not yet approved or cleared by the [place] FDA and has been authorized for detection and/or diagnosis of SARS-CoV-2 by FDA under an Emergency Use Authorization (EUA). This EUA will remain in effect (meaning this test can be used) for the duration of the COVID-19 declaration under Section 564(b)(1) of the Act, 21 U.S.C. section 360bbb-3(b)(1), unless the authorization is terminated or revoked.     Resp Syncytial Virus by PCR POSITIVE (A) NEGATIVE Final    Comment: (NOTE) Fact Sheet for Patients: EntrepreneurPulse.com.au  Fact Sheet for Healthcare Providers: IncredibleEmployment.be  This test is not yet approved or cleared by the [place] FDA and has been authorized for detection and/or diagnosis of SARS-CoV-2 by FDA under an Emergency Use Authorization (EUA). This EUA will remain in effect (meaning this test can be used) for the duration of the COVID-19 declaration under Section 564(b)(1) of the Act, 21 U.S.C. section 360bbb-3(b)(1), unless the authorization is terminated or revoked.  Performed at [facility], [address] [place], [place] [address]   Blood culture (routine x 2)     Status: None (Preliminary result)   Collection Time: 04/17/22  9:27 PM   Specimen: BLOOD  Result Value Ref Range Status   Specimen Description BLOOD RIGHT FA  Final   Special Requests   Final    BOTTLES DRAWN AEROBIC AND ANAEROBIC Blood Culture adequate volume   Culture   Final    NO GROWTH 4 DAYS Performed at [facility], [address] [place], [place] [address]    Report Status PENDING  Incomplete         Radiology Studies: CT CHEST WO CONTRAST  Result Date: 04/19/2022 CLINICAL DATA:  Cough. EXAM: CT CHEST WITHOUT  CONTRAST TECHNIQUE: Multidetector CT imaging of the chest was performed following the standard protocol without IV contrast. RADIATION DOSE REDUCTION: This exam was performed according to the departmental dose-optimization program which includes automated exposure control, adjustment of the mA and/or kV according to patient size and/or use of iterative reconstruction technique. COMPARISON:  Chest radiograph dated 04/19/2022. FINDINGS: Evaluation of this exam is limited in the absence of intravenous contrast. Cardiovascular: There is no cardiomegaly or pericardial effusion. Two vessel coronary vascular calcification involving LAD and RCA. Mild atherosclerotic calcification of the thoracic aorta. No aneurysmal dilatation. Mildly dilated main pulmonary trunk suggestive of pulmonary hypertension. Clinical correlation is recommended. Mediastinum/Nodes: No hilar or mediastinal adenopathy. The esophagus is grossly unremarkable. No mediastinal fluid collection. Lungs/Pleura: Mild centrilobular emphysema. No focal consolidation, pleural effusion, or pneumothorax. The central airways are patent. Upper Abdomen: Fatty liver.  Colonic diverticulosis. Musculoskeletal: Osteopenia with degenerative changes  of the spine. No acute osseous pathology. IMPRESSION: 1. No acute intrathoracic pathology. 2. Mildly dilated main pulmonary trunk suggestive of pulmonary hypertension. 3. Fatty liver. 4. Colonic diverticulosis. 5. Aortic Atherosclerosis (ICD10-I70.0) and Emphysema (ICD10-J43.9). Electronically Signed   By: Anner Crete M.D.   On: 04/19/2022 19:34   DG Chest 2 View  Result Date: 04/19/2022 CLINICAL DATA:  Shortness of breath EXAM: CHEST - 2 VIEW COMPARISON:  04/17/2022 FINDINGS: Stable cardiomediastinal contours. Persistent elevation of the left hemidiaphragm. No focal airspace consolidation, pleural effusion, or pneumothorax. IMPRESSION: No active cardiopulmonary disease. Electronically Signed   By: Davina Poke D.O.    On: 04/19/2022 15:16        Scheduled Meds:  apixaban  5 mg Oral BID   arformoterol  15 mcg Nebulization BID   budesonide (PULMICORT) nebulizer solution  0.25 mg Nebulization BID   guaiFENesin  600 mg Oral BID   insulin aspart  0-15 Units Subcutaneous TID WC   insulin aspart  0-5 Units Subcutaneous QHS   insulin glargine-yfgn  5 Units Subcutaneous Daily   ipratropium-albuterol  3 mL Nebulization QID   linagliptin  5 mg Oral Daily   lisinopril  20 mg Oral Daily   metoprolol succinate  25 mg Oral Daily   multivitamin with minerals  1 tablet Oral Daily   predniSONE  40 mg Oral Q breakfast   Continuous Infusions:   LOS: 1 day      Sidney Ace, MD [facility]   If 7PM-7AM, please contact night-coverage  04/21/2022, 9:59 AM

## 2022-04-22 DIAGNOSIS — J441 Chronic obstructive pulmonary disease with (acute) exacerbation: Secondary | ICD-10-CM | POA: Diagnosis not present

## 2022-04-22 LAB — CULTURE, BLOOD (ROUTINE X 2)
Culture: NO GROWTH
Culture: NO GROWTH
Special Requests: ADEQUATE

## 2022-04-22 LAB — GLUCOSE, CAPILLARY
Glucose-Capillary: 188 mg/dL — ABNORMAL HIGH (ref 70–99)
Glucose-Capillary: 197 mg/dL — ABNORMAL HIGH (ref 70–99)

## 2022-04-22 MED ORDER — BENZONATATE 100 MG PO CAPS: 100.0000 mg | ORAL_CAPSULE | Freq: Three times a day (TID) | ORAL | 0 refills | Status: AC | PRN

## 2022-04-22 MED ORDER — IPRATROPIUM-ALBUTEROL 0.5-2.5 (3) MG/3ML IN SOLN: 3.0000 mL | RESPIRATORY_TRACT | 0 refills | Status: DC | PRN

## 2022-04-22 MED ORDER — ALBUTEROL SULFATE HFA 108 (90 BASE) MCG/ACT IN AERS: 2.0000 | INHALATION_SPRAY | Freq: Four times a day (QID) | RESPIRATORY_TRACT | 2 refills | Status: DC | PRN

## 2022-04-22 MED ORDER — PREDNISONE 20 MG PO TABS: 40.0000 mg | ORAL_TABLET | Freq: Every day | ORAL | 0 refills | Status: AC

## 2022-04-22 NOTE — Progress Notes (Signed)
Report called to [facility] with no answer.

## 2022-04-22 NOTE — Progress Notes (Signed)
Report given to Maceo Pro at [facility] with all question answered.

## 2022-04-22 NOTE — Discharge Summary (Signed)
Physician Discharge Summary  [name] [id number] DOB: 01/22/1932 DOA: 04/19/2022  PCP: Dewayne Shorter, MD  Admit date: 04/19/2022 Discharge date: 04/22/2022  Admitted From: ALF Disposition:  ALF [facility]  Recommendations for Outpatient Follow-up:  Follow up with PCP in 1-2 weeks   Home Health:Yes PT RN Equipment/Devices:Nebulizer  Discharge Condition:Stable CODE STATUS:DNR  Diet recommendation: Reg  Brief/Interim Summary: 89 y.o. Caucasian female with medical history significant for dementia, hypertension, type 2 diabetes mellitus, paroxysmal atrial fibrillation on Eliquis and dyslipidemia, who presented to the emergency room with acute onset of worsening dyspnea with associated productive cough and wheezing.  She was just discharged home yesterday after being managed here for RSV infection for a day.  She was treated with Decadron and PR and albuterol.  She became hyper glycemic on Decadron and refused insulin but continued on her glipizide.  No fever or chills.  No chest pain or palpitations.  No nausea or vomiting or abdominal pain.  No dysuria, oliguria or hematuria or flank pain.     Discharge Diagnoses:  Principal Problem:   COPD exacerbation ([place]) Active Problems:   Paroxysmal atrial fibrillation (HCC)   Essential hypertension   Type 2 diabetes mellitus without complications ([place])  Suspected COPD exacerbation was driven by recent viral syndrome.  Patient on 2 L at time of my evaluation.  Has some wheezing with normal work of breathing. -Oxygen weaned to RA at time of DC - Still wheezing, SOB improved Plan: PO prednisone 76m qD x 5 days Albuterol MDI Nebiulizer with prn duoneb  Essential hypertension Blood pressure controlled.  Continue current therapy   Paroxysmal atrial fibrillation (HCC) Rate controlled.  Continue Toprol and Eliquis   Type 2 diabetes mellitus without complications (HCC) Tradjenta resumed Glipizide resumed  Discharge  Instructions  Discharge Instructions     Ambulatory referral to Physical Therapy   [facility]: [facility]   Diet - low sodium heart healthy   Complete by: As directed    For home use only DME Nebulizer machine   Complete by: As directed    Patient needs a nebulizer to treat with the following condition: COPD exacerbation ([place]   Length of Need: Lifetime   Increase activity slowly   Complete by: As directed       Allergies as of 04/22/2022       Reactions   Morphine Sulfate Other (See Comments)   BP bottoms out   Penicillins Diarrhea   Has patient had a PCN reaction causing immediate rash, facial/tongue/throat swelling, SOB or lightheadedness with hypotension: Yes Has patient had a PCN reaction causing severe rash involving mucus membranes or skin necrosis: Yes Has patient had a PCN reaction that required hospitalization No Has patient had a PCN reaction occurring within the last 10 years: No If all of the above answers are "NO", then may proceed with Cephalosporin use.        Medication List     TAKE these medications    acetaminophen 325 MG tablet Commonly known as: TYLENOL Take 650 mg by mouth 2 (two) times daily.   acetaminophen 325 MG tablet Commonly known as: TYLENOL Take 650 mg by mouth every 4 (four) hours as needed for mild pain or moderate pain.   albuterol 108 (90 Base) MCG/ACT inhaler Commonly known as: VENTOLIN HFA Inhale 2 puffs into the lungs every 6 (six) hours as needed for wheezing or shortness of breath.   apixaban 5 MG Tabs tablet  Commonly known as: ELIQUIS Take 1 tablet (5 mg total) by mouth 2 (two) times daily.   benzonatate 100 MG capsule Commonly known as: Tessalon Perles Take 1 capsule (100 mg total) by mouth 3 (three) times daily as needed for cough.   Clobetasol Propionate 0.05 % shampoo Apply 1 Application topically every morning. And wash out   dextromethorphan-guaiFENesin 10-100 MG/5ML  liquid Commonly known as: ROBITUSSIN-DM Take 5 mLs by mouth every 4 (four) hours as needed for up to 5 days for cough.   fluocinolone 0.01 % external solution Commonly known as: SYNALAR Apply 1 Application topically at bedtime. Apply to scalp   furosemide 20 MG tablet Commonly known as: LASIX TAKE 1 TABLET BY MOUTH DAILY ONLY IF NEEDED.   glipiZIDE 10 MG 24 hr tablet Commonly known as: glipiZIDE XL Take 1 tablet (10 mg total) by mouth daily with breakfast.   ipratropium-albuterol 0.5-2.5 (3) MG/3ML Soln Commonly known as: DUONEB Take 3 mLs by nebulization every 4 (four) hours as needed.   linagliptin 5 MG Tabs tablet Commonly known as: Tradjenta Take 1 tablet (5 mg total) by mouth daily.   lisinopril 20 MG tablet Commonly known as: ZESTRIL TAKE 1 TABLET BY MOUTH DAILY   metFORMIN 500 MG 24 hr tablet Commonly known as: GLUCOPHAGE-XR Take 1 tablet (500 mg total) by mouth 2 (two) times daily.   metoprolol succinate 25 MG 24 hr tablet Commonly known as: TOPROL-XL TAKE 1 TABLET BY MOUTH ONCE DAILY   multivitamin with minerals Tabs tablet Take 1 tablet by mouth daily.   predniSONE 20 MG tablet Commonly known as: DELTASONE Take 2 tablets (40 mg total) by mouth daily with breakfast for 5 days.               Durable Medical Equipment  (From admission, onward)           Start     Ordered   04/22/22 0000  For home use only DME Nebulizer machine       Question Answer Comment  Patient needs a nebulizer to treat with the following condition COPD exacerbation ([place])   Length of Need Lifetime      04/22/22 0916            Allergies  Allergen Reactions   Morphine Sulfate Other (See Comments)    BP bottoms out   Penicillins Diarrhea    Has patient had a PCN reaction causing immediate rash, facial/tongue/throat swelling, SOB or lightheadedness with hypotension: Yes Has patient had a PCN reaction causing severe rash involving mucus membranes or skin necrosis:  Yes Has patient had a PCN reaction that required hospitalization No Has patient had a PCN reaction occurring within the last 10 years: No If all of the above answers are "NO", then may proceed with Cephalosporin use.    Consultations: None   Procedures/Studies: CT CHEST WO CONTRAST  Result Date: 04/19/2022 CLINICAL DATA:  Cough. EXAM: CT CHEST WITHOUT CONTRAST TECHNIQUE: Multidetector CT imaging of the chest was performed following the standard protocol without IV contrast. RADIATION DOSE REDUCTION: This exam was performed according to the departmental dose-optimization program which includes automated exposure control, adjustment of the mA and/or kV according to patient size and/or use of iterative reconstruction technique. COMPARISON:  Chest radiograph dated 04/19/2022. FINDINGS: Evaluation of this exam is limited in the absence of intravenous contrast. Cardiovascular: There is no cardiomegaly or pericardial effusion. Two vessel coronary vascular calcification involving LAD and RCA. Mild atherosclerotic calcification of the thoracic aorta. No  aneurysmal dilatation. Mildly dilated main pulmonary trunk suggestive of pulmonary hypertension. Clinical correlation is recommended. Mediastinum/Nodes: No hilar or mediastinal adenopathy. The esophagus is grossly unremarkable. No mediastinal fluid collection. Lungs/Pleura: Mild centrilobular emphysema. No focal consolidation, pleural effusion, or pneumothorax. The central airways are patent. Upper Abdomen: Fatty liver.  Colonic diverticulosis. Musculoskeletal: Osteopenia with degenerative changes of the spine. No acute osseous pathology. IMPRESSION: 1. No acute intrathoracic pathology. 2. Mildly dilated main pulmonary trunk suggestive of pulmonary hypertension. 3. Fatty liver. 4. Colonic diverticulosis. 5. Aortic Atherosclerosis (ICD10-I70.0) and Emphysema (ICD10-J43.9). Electronically Signed   By: Anner Crete M.D.   On: 04/19/2022 19:34   DG Chest 2  View  Result Date: 04/19/2022 CLINICAL DATA:  Shortness of breath EXAM: CHEST - 2 VIEW COMPARISON:  04/17/2022 FINDINGS: Stable cardiomediastinal contours. Persistent elevation of the left hemidiaphragm. No focal airspace consolidation, pleural effusion, or pneumothorax. IMPRESSION: No active cardiopulmonary disease. Electronically Signed   By: Davina Poke D.O.   On: 04/19/2022 15:16   DG Chest Portable 1 View  Result Date: 04/17/2022 CLINICAL DATA:  Shortness of breath EXAM: PORTABLE CHEST 1 VIEW COMPARISON:  07/24/2021 FINDINGS: Chronic elevation left diaphragm. Mild hypoventilatory changes with atelectasis at the left base. No consolidation or effusion. Stable cardiomediastinal silhouette with aortic atherosclerosis IMPRESSION: No active disease. Chronic elevation left diaphragm with atelectasis at the left base. Electronically Signed   By: Donavan Foil M.D.   On: 04/17/2022 20:53      Subjective: Seen and examined on the day of discharge.  Stable no distress.  Appropriate for discharge back to assisted living facility.  Discharge Exam: Vitals:   04/22/22 0710 04/22/22 0750  BP: (!) 168/54 (!) 156/68  Pulse: 75   Resp: 19   Temp: 98.5 F (36.9 C)   SpO2: 95%    Vitals:   04/21/22 2037 04/22/22 0515 04/22/22 0710 04/22/22 0750  BP:  (!) 170/80 (!) 168/54 (!) 156/68  Pulse:  79 75   Resp:  18 19   Temp:  97.7 F (36.5 C) 98.5 F (36.9 C)   TempSrc:   Oral   SpO2: 93% 96% 95%   Weight:      Height:        General: Pt is alert, awake, not in acute distress Cardiovascular: RRR, S1/S2 +, no rubs, no gallops Respiratory: Scattered wheeze.  Normal work of breathing.  Room air Abdominal: Soft, NT, ND, bowel sounds + Extremities: no edema, no cyanosis    The results of significant diagnostics from this hospitalization (including imaging, microbiology, ancillary and laboratory) are listed below for reference.     Microbiology: Recent Results (from the past 240 hour(s))   Blood culture (routine x 2)     Status: None   Collection Time: 04/17/22  8:20 PM   Specimen: BLOOD  Result Value Ref Range Status   Specimen Description BLOOD LEFT AC  Final   Special Requests   Final    BOTTLES DRAWN AEROBIC AND ANAEROBIC Blood Culture results may not be optimal due to an excessive volume of blood received in culture bottles   Culture   Final    NO GROWTH 5 DAYS Performed at [facility], [address] [place], [place] [address]    Report Status 04/22/2022 FINAL  Final  Resp panel by RT-PCR (RSV, Flu A&B, Covid) Anterior Nasal Swab     Status: Abnormal   Collection Time: 04/17/22  8:20 PM   Specimen: Anterior Nasal Swab  Result Value Ref Range  Status   SARS Coronavirus 2 by RT PCR NEGATIVE NEGATIVE Final    Comment: (NOTE) SARS-CoV-2 target nucleic acids are NOT DETECTED.  The SARS-CoV-2 RNA is generally detectable in upper respiratory specimens during the acute phase of infection. The lowest concentration of SARS-CoV-2 viral copies this assay can detect is 138 copies/mL. A negative result does not preclude SARS-Cov-2 infection and should not be used as the sole basis for treatment or other patient management decisions. A negative result may occur with  improper specimen collection/handling, submission of specimen other than nasopharyngeal swab, presence of viral mutation(s) within the areas targeted by this assay, and inadequate number of viral copies(<138 copies/mL). A negative result must be combined with clinical observations, patient history, and epidemiological information. The expected result is Negative.  Fact Sheet for Patients:  EntrepreneurPulse.com.au  Fact Sheet for Healthcare Providers:  IncredibleEmployment.be  This test is no t yet approved or cleared by the [place] FDA and  has been authorized for detection and/or diagnosis of SARS-CoV-2 by FDA under an Emergency Use Authorization  (EUA). This EUA will remain  in effect (meaning this test can be used) for the duration of the COVID-19 declaration under Section 564(b)(1) of the Act, 21 U.S.C.section 360bbb-3(b)(1), unless the authorization is terminated  or revoked sooner.       Influenza A by PCR NEGATIVE NEGATIVE Final   Influenza B by PCR NEGATIVE NEGATIVE Final    Comment: (NOTE) The Xpert Xpress SARS-CoV-2/FLU/RSV plus assay is intended as an aid in the diagnosis of influenza from Nasopharyngeal swab specimens and should not be used as a sole basis for treatment. Nasal washings and aspirates are unacceptable for Xpert Xpress SARS-CoV-2/FLU/RSV testing.  Fact Sheet for Patients: EntrepreneurPulse.com.au  Fact Sheet for Healthcare Providers: IncredibleEmployment.be  This test is not yet approved or cleared by the [place] FDA and has been authorized for detection and/or diagnosis of SARS-CoV-2 by FDA under an Emergency Use Authorization (EUA). This EUA will remain in effect (meaning this test can be used) for the duration of the COVID-19 declaration under Section 564(b)(1) of the Act, 21 U.S.C. section 360bbb-3(b)(1), unless the authorization is terminated or revoked.     Resp Syncytial Virus by PCR POSITIVE (A) NEGATIVE Final    Comment: (NOTE) Fact Sheet for Patients: EntrepreneurPulse.com.au  Fact Sheet for Healthcare Providers: IncredibleEmployment.be  This test is not yet approved or cleared by the [place] FDA and has been authorized for detection and/or diagnosis of SARS-CoV-2 by FDA under an Emergency Use Authorization (EUA). This EUA will remain in effect (meaning this test can be used) for the duration of the COVID-19 declaration under Section 564(b)(1) of the Act, 21 U.S.C. section 360bbb-3(b)(1), unless the authorization is terminated or revoked.  Performed at [facility], [address]  [place], [place] [address]   Blood culture (routine x 2)     Status: None   Collection Time: 04/17/22  9:27 PM   Specimen: BLOOD  Result Value Ref Range Status   Specimen Description BLOOD RIGHT FA  Final   Special Requests   Final    BOTTLES DRAWN AEROBIC AND ANAEROBIC Blood Culture adequate volume   Culture   Final    NO GROWTH 5 DAYS Performed at [facility], [address] [place], [place] [address]    Report Status 04/22/2022 FINAL  Final     Labs: BNP (last 3 results) No results for input(s): "BNP" in the last 8760 hours. Basic Metabolic Panel: Recent Labs  Lab 04/17/22 2020 04/18/22 0557 04/19/22 1537 04/20/22 0503  NA 132* 135 138 136  K 3.8 4.1 3.8 4.4  CL 98 104 105 105  CO2 21* 23 26 22  $ GLUCOSE 263* 307* 136* 411*  BUN 18 24* 28* 26*  CREATININE 0.80 0.92 0.95 0.88  CALCIUM 9.1 8.3* 8.9 8.3*   Liver Function Tests: Recent Labs  Lab 04/17/22 2020 04/18/22 0557  AST 24 21  ALT 26 25  ALKPHOS 57 48  BILITOT 1.0 0.8  PROT 7.7 6.7  ALBUMIN 4.2 3.4*   No results for input(s): "LIPASE", "AMYLASE" in the last 168 hours. No results for input(s): "AMMONIA" in the last 168 hours. CBC: Recent Labs  Lab 04/17/22 2020 04/18/22 0557 04/19/22 1537 04/20/22 0503  WBC 8.0 6.4 7.6 9.6  NEUTROABS  --  5.1 3.1  --   HGB 13.7 13.1 13.5 12.3  HCT 41.3 40.0 40.3 37.4  MCV 89.2 89.7 87.8 89.3  PLT 207 191 202 199   Cardiac Enzymes: No results for input(s): "CKTOTAL", "CKMB", "CKMBINDEX", "TROPONINI" in the last 168 hours. BNP: Invalid input(s): "POCBNP" CBG: Recent Labs  Lab 04/21/22 0739 04/21/22 1154 04/21/22 1631 04/21/22 2011 04/22/22 0812  GLUCAP 261* 202* 321* 343* 188*   D-Dimer No results for input(s): "DDIMER" in the last 72 hours. Hgb A1c No results for input(s): "HGBA1C" in the last 72 hours. Lipid Profile No results for input(s): "CHOL", "HDL", "LDLCALC", "TRIG", "CHOLHDL", "LDLDIRECT" in the last 72 hours. Thyroid function  studies No results for input(s): "TSH", "T4TOTAL", "T3FREE", "THYROIDAB" in the last 72 hours.  Invalid input(s): "FREET3" Anemia work up No results for input(s): "VITAMINB12", "FOLATE", "FERRITIN", "TIBC", "IRON", "RETICCTPCT" in the last 72 hours. Urinalysis    Component Value Date/Time   COLORURINE YELLOW (A) 07/24/2021 1826   APPEARANCEUR CLEAR (A) 07/24/2021 1826   APPEARANCEUR Clear 11/21/2015 0919   LABSPEC 1.025 07/24/2021 1826   PHURINE 5.0 07/24/2021 1826   GLUCOSEU >=500 (A) 07/24/2021 1826   HGBUR NEGATIVE 07/24/2021 1826   BILIRUBINUR NEGATIVE 07/24/2021 1826   BILIRUBINUR neg 12/05/2019 1206   BILIRUBINUR Negative 11/21/2015 0919   KETONESUR 20 (A) 07/24/2021 1826   PROTEINUR NEGATIVE 07/24/2021 1826   UROBILINOGEN 0.2 12/05/2019 1206   NITRITE NEGATIVE 07/24/2021 1826   LEUKOCYTESUR NEGATIVE 07/24/2021 1826   Sepsis Labs Recent Labs  Lab 04/17/22 2020 04/18/22 0557 04/19/22 1537 04/20/22 0503  WBC 8.0 6.4 7.6 9.6   Microbiology Recent Results (from the past 240 hour(s))  Blood culture (routine x 2)     Status: None   Collection Time: 04/17/22  8:20 PM   Specimen: BLOOD  Result Value Ref Range Status   Specimen Description BLOOD LEFT AC  Final   Special Requests   Final    BOTTLES DRAWN AEROBIC AND ANAEROBIC Blood Culture results may not be optimal due to an excessive volume of blood received in culture bottles   Culture   Final    NO GROWTH 5 DAYS Performed at [facility], [address] [place], [place] [address]    Report Status 04/22/2022 FINAL  Final  Resp panel by RT-PCR (RSV, Flu A&B, Covid) Anterior Nasal Swab     Status: Abnormal   Collection Time: 04/17/22  8:20 PM   Specimen: Anterior Nasal Swab  Result Value Ref Range Status   SARS Coronavirus 2 by RT PCR NEGATIVE NEGATIVE Final    Comment: (NOTE) SARS-CoV-2 target nucleic acids are NOT DETECTED.  The SARS-CoV-2 RNA is generally  detectable in upper respiratory specimens  during the acute phase of infection. The lowest concentration of SARS-CoV-2 viral copies this assay can detect is 138 copies/mL. A negative result does not preclude SARS-Cov-2 infection and should not be used as the sole basis for treatment or other patient management decisions. A negative result may occur with  improper specimen collection/handling, submission of specimen other than nasopharyngeal swab, presence of viral mutation(s) within the areas targeted by this assay, and inadequate number of viral copies(<138 copies/mL). A negative result must be combined with clinical observations, patient history, and epidemiological information. The expected result is Negative.  Fact Sheet for Patients:  EntrepreneurPulse.com.au  Fact Sheet for Healthcare Providers:  IncredibleEmployment.be  This test is no t yet approved or cleared by the [place] FDA and  has been authorized for detection and/or diagnosis of SARS-CoV-2 by FDA under an Emergency Use Authorization (EUA). This EUA will remain  in effect (meaning this test can be used) for the duration of the COVID-19 declaration under Section 564(b)(1) of the Act, 21 U.S.C.section 360bbb-3(b)(1), unless the authorization is terminated  or revoked sooner.       Influenza A by PCR NEGATIVE NEGATIVE Final   Influenza B by PCR NEGATIVE NEGATIVE Final    Comment: (NOTE) The Xpert Xpress SARS-CoV-2/FLU/RSV plus assay is intended as an aid in the diagnosis of influenza from Nasopharyngeal swab specimens and should not be used as a sole basis for treatment. Nasal washings and aspirates are unacceptable for Xpert Xpress SARS-CoV-2/FLU/RSV testing.  Fact Sheet for Patients: EntrepreneurPulse.com.au  Fact Sheet for Healthcare Providers: IncredibleEmployment.be  This test is not yet approved or cleared by the [place] FDA and has been authorized for detection  and/or diagnosis of SARS-CoV-2 by FDA under an Emergency Use Authorization (EUA). This EUA will remain in effect (meaning this test can be used) for the duration of the COVID-19 declaration under Section 564(b)(1) of the Act, 21 U.S.C. section 360bbb-3(b)(1), unless the authorization is terminated or revoked.     Resp Syncytial Virus by PCR POSITIVE (A) NEGATIVE Final    Comment: (NOTE) Fact Sheet for Patients: EntrepreneurPulse.com.au  Fact Sheet for Healthcare Providers: IncredibleEmployment.be  This test is not yet approved or cleared by the [place] FDA and has been authorized for detection and/or diagnosis of SARS-CoV-2 by FDA under an Emergency Use Authorization (EUA). This EUA will remain in effect (meaning this test can be used) for the duration of the COVID-19 declaration under Section 564(b)(1) of the Act, 21 U.S.C. section 360bbb-3(b)(1), unless the authorization is terminated or revoked.  Performed at [facility], [address] [place], [place] [address]   Blood culture (routine x 2)     Status: None   Collection Time: 04/17/22  9:27 PM   Specimen: BLOOD  Result Value Ref Range Status   Specimen Description BLOOD RIGHT FA  Final   Special Requests   Final    BOTTLES DRAWN AEROBIC AND ANAEROBIC Blood Culture adequate volume   Culture   Final    NO GROWTH 5 DAYS Performed at [facility], [address], [place], [place] [address]    Report Status 04/22/2022 FINAL  Final     Time coordinating discharge: Over 30 minutes  SIGNED:   Sidney Ace, MD  [facility] 04/22/2022, 9:36 AM Pager   If 7PM-7AM, please contact night-coverage

## 2022-04-22 NOTE — TOC Transition Note (Addendum)
Transition of Care [facility]) - CM/SW Discharge Note   Patient Details  Name: [name] MRN: [id number] Date of Birth: 1931-04-30  [facility] CM/SW Contact:  Gerilyn Pilgrim, LCSW Phone Number: 04/22/2022, 9:29 AM   Clinical Narrative:   Pt has orders to discharge back to [facility]. Number for report given to RN. Seth Bake notified. Daughter will be here around lunch to transport patient. Pt will get HH at [facility]. Nebulizer ordered through [employer]. FL2 and DC summary have been sent to facility.  Final next level of care: Assisted Living Barriers to Discharge: Barriers Resolved   Patient Goals and CMS Choice CMS Medicare.gov Compare Post Acute Care list provided to:: Patient    Discharge Placement                  Patient to be transferred to facility by: daughter      Discharge Plan and Services Additional resources added to the After Visit Summary for                  DME Arranged: Nebulizer machine DME Agency: [employer] Date DME Agency Contacted: 04/22/22 Time DME Agency Contacted: [phone] Representative spoke with at DME Agency: Darnelle Maffucci            Social Determinants of Health ([place]) Interventions SDOH Screenings   Food Insecurity: No Food Insecurity (04/25/2020)  Housing: Low Risk  (04/25/2020)  Transportation Needs: No Transportation Needs (04/25/2020)  Alcohol Screen: Low Risk  (10/19/2020)  Depression (PHQ2-9): Low Risk  (10/19/2020)  Financial Resource Strain: Low Risk  (04/25/2020)  Physical Activity: Inactive (04/25/2020)  Social Connections: Moderately Isolated (04/25/2020)  Stress: No Stress Concern Present (04/25/2020)  Tobacco Use: Medium Risk (04/18/2022)     Readmission Risk Interventions     No data to display

## 2022-04-22 NOTE — NC FL2 (Signed)
[place] LEVEL OF CARE FORM     IDENTIFICATION  Patient Name: [name] Birthdate: Dec 12, 1931 Sex: female Admission Date (Current Location): 04/19/2022  [place] [place] and [place] Number:  Engineering geologist and Address:  [facility], [address] [place], [place] [address]      Provider Number: [id number]  Attending Physician Name and Address:  Sidney Ace, MD  Relative Name and Phone Number:       Current Level of Care: Hospital Recommended Level of Care: [place] Prior Approval Number:    Date Approved/Denied:   PASRR Number:    Discharge Plan: Domiciliary (Rest home)    Current Diagnoses: Patient Active Problem List   Diagnosis Date Noted   COPD exacerbation ([place]) 04/19/2022   Type 2 diabetes mellitus without complications ([place]) [id number]   RSV infection 04/17/2022   Hyponatremia 04/17/2022   Aortic atherosclerosis ([place]) 09/05/2021   Dyslipidemia 07/25/2021   Essential hypertension 07/25/2021   DNR (do not resuscitate) 10/19/2020   Pressure injury of skin 12/24/2019   Abnormal echocardiogram 10/12/2019   Hip pain 12/23/2018   Lower extremity edema 12/24/2016   Current use of long term anticoagulation 09/17/2016   Paroxysmal atrial fibrillation ([place]) 08/13/2016   Senile purpura ([place]) 01/01/2015   Arthritis 07/12/2014   Basal cell carcinoma 07/12/2014   CC (collagenous colitis) 07/12/2014   Obesity 07/12/2014   Acne erythematosa 07/12/2014   Diabetes mellitus with circulatory complication, without long-term current use of insulin ([place]) 07/12/2014   Avitaminosis D 07/12/2014   H/O malignant neoplasm of skin 10/10/2011    Orientation RESPIRATION BLADDER Height & Weight     Self, Time, Situation, Place  Other (Comment) (Pt will discharge with nebulizer) Continent Weight: 174 lb 6.1 oz (79.1 kg) Height:  5' (152.4 cm)  BEHAVIORAL SYMPTOMS/MOOD NEUROLOGICAL BOWEL NUTRITION STATUS       Continent Diet (carb modified)  AMBULATORY STATUS COMMUNICATION OF NEEDS Skin   Supervision Verbally Normal                       Personal Care Assistance Level of Assistance  Bathing, Feeding, Dressing Bathing Assistance: Limited assistance Feeding assistance: Limited assistance Dressing Assistance: Limited assistance     Functional Limitations Info  Speech Sight Info: Adequate Hearing Info: Adequate Speech Info: Adequate    SPECIAL CARE FACTORS FREQUENCY  PT (By licensed PT)     PT Frequency: on site therapy              Contractures Contractures Info: Not present    Additional Factors Info  Isolation Precautions, Allergies Code Status Info: DNR Allergies Info: Morphine Sulfate  Penicillins     Isolation Precautions Info: RSV     Current Medications (04/22/2022):  This is the current hospital active medication list Current Facility-Administered Medications  Medication Dose Route Frequency Provider Last Rate Last Admin   acetaminophen (TYLENOL) tablet 650 mg  650 mg Oral Q6H PRN Mansy, Jan A, MD   650 mg at 04/21/22 [id number]   Or   acetaminophen (TYLENOL) suppository 650 mg  650 mg Rectal Q6H PRN Mansy, Jan A, MD       apixaban Arne [place]) tablet 5 mg  5 mg Oral BID Mansy, Jan A, MD   5 mg at 04/21/22 2119   arformoterol (BROVANA) nebulizer solution 15 mcg  15 mcg Nebulization BID Ralene Muskrat B, MD   15 mcg at 04/22/22 0752   budesonide (PULMICORT) nebulizer solution  0.25 mg  0.25 mg Nebulization BID Priscella Mann, Sudheer B, MD   0.25 mg at 04/22/22 0744   chlorpheniramine-HYDROcodone (TUSSIONEX) 10-8 MG/5ML suspension 5 mL  5 mL Oral Q12H PRN Mansy, Jan A, MD   5 mL at 04/20/22 1453   Ensure liquid 237 mL  237 mL Oral PRN Mansy, Jan A, MD       guaiFENesin (MUCINEX) 12 hr tablet 600 mg  600 mg Oral BID Mansy, Jan A, MD   600 mg at 04/21/22 2119   guaiFENesin-dextromethorphan (ROBITUSSIN DM) 100-10 MG/5ML syrup 5 mL  5 mL Oral Q4H PRN Sreenath, Sudheer B, MD        insulin aspart (novoLOG) injection 0-15 Units  0-15 Units Subcutaneous TID WC Ralene Muskrat B, MD   11 Units at 04/21/22 1729   insulin aspart (novoLOG) injection 0-5 Units  0-5 Units Subcutaneous QHS Ralene Muskrat B, MD   4 Units at 04/21/22 2119   insulin glargine-yfgn (SEMGLEE) injection 5 Units  5 Units Subcutaneous Daily Ralene Muskrat B, MD   5 Units at 04/21/22 1221   ipratropium-albuterol (DUONEB) 0.5-2.5 (3) MG/3ML nebulizer solution 3 mL  3 mL Nebulization QID Mansy, Jan A, MD   3 mL at 04/22/22 0744   linagliptin (TRADJENTA) tablet 5 mg  5 mg Oral Daily Mansy, Jan A, MD   5 mg at 04/21/22 0845   lisinopril (ZESTRIL) tablet 20 mg  20 mg Oral Daily Mansy, Jan A, MD   20 mg at 04/21/22 0850   magnesium hydroxide (MILK OF MAGNESIA) suspension 30 mL  30 mL Oral Daily PRN Mansy, Jan A, MD       metoprolol succinate (TOPROL-XL) 24 hr tablet 25 mg  25 mg Oral Daily Mansy, Jan A, MD   25 mg at 04/21/22 0848   multivitamin with minerals tablet 1 tablet  1 tablet Oral Daily Mansy, Jan A, MD   1 tablet at 04/21/22 0851   ondansetron (ZOFRAN) tablet 4 mg  4 mg Oral Q6H PRN Mansy, Jan A, MD       Or   ondansetron [facility]) injection 4 mg  4 mg Intravenous Q6H PRN Mansy, Jan A, MD       predniSONE (DELTASONE) tablet 40 mg  40 mg Oral Q breakfast Mansy, Jan A, MD   40 mg at 04/21/22 0849   traZODone (DESYREL) tablet 25 mg  25 mg Oral QHS PRN Mansy, Arvella Merles, MD         Discharge Medications: TAKE these medications     acetaminophen 325 MG tablet Commonly known as: TYLENOL Take 650 mg by mouth 2 (two) times daily.    acetaminophen 325 MG tablet Commonly known as: TYLENOL Take 650 mg by mouth every 4 (four) hours as needed for mild pain or moderate pain.    albuterol 108 (90 Base) MCG/ACT inhaler Commonly known as: VENTOLIN HFA Inhale 2 puffs into the lungs every 6 (six) hours as needed for wheezing or shortness of breath.    apixaban 5 MG Tabs tablet Commonly known as:  ELIQUIS Take 1 tablet (5 mg total) by mouth 2 (two) times daily.    benzonatate 100 MG capsule Commonly known as: Tessalon Perles Take 1 capsule (100 mg total) by mouth 3 (three) times daily as needed for cough.    Clobetasol Propionate 0.05 % shampoo Apply 1 Application topically every morning. And wash out    dextromethorphan-guaiFENesin 10-100 MG/5ML liquid Commonly known as: ROBITUSSIN-DM Take 5 mLs by mouth every  4 (four) hours as needed for up to 5 days for cough.    fluocinolone 0.01 % external solution Commonly known as: SYNALAR Apply 1 Application topically at bedtime. Apply to scalp    furosemide 20 MG tablet Commonly known as: LASIX TAKE 1 TABLET BY MOUTH DAILY ONLY IF NEEDED.    glipiZIDE 10 MG 24 hr tablet Commonly known as: glipiZIDE XL Take 1 tablet (10 mg total) by mouth daily with breakfast.    ipratropium-albuterol 0.5-2.5 (3) MG/3ML Soln Commonly known as: DUONEB Take 3 mLs by nebulization every 4 (four) hours as needed.    linagliptin 5 MG Tabs tablet Commonly known as: Tradjenta Take 1 tablet (5 mg total) by mouth daily.    lisinopril 20 MG tablet Commonly known as: ZESTRIL TAKE 1 TABLET BY MOUTH DAILY    metFORMIN 500 MG 24 hr tablet Commonly known as: GLUCOPHAGE-XR Take 1 tablet (500 mg total) by mouth 2 (two) times daily.    metoprolol succinate 25 MG 24 hr tablet Commonly known as: TOPROL-XL TAKE 1 TABLET BY MOUTH ONCE DAILY    multivitamin with minerals Tabs tablet Take 1 tablet by mouth daily.    predniSONE 20 MG tablet Commonly known as: DELTASONE Take 2 tablets (40 mg total) by mouth daily with breakfast for 5 days.      Relevant Imaging Results:  Relevant Lab Results:   Additional Information SS- [id number]  Gerilyn Pilgrim, LCSW

## 2022-04-23 ENCOUNTER — Encounter: Payer: Self-pay | Admitting: Student

## 2022-04-23 ENCOUNTER — Non-Acute Institutional Stay: Payer: Medicare Other | Admitting: Student

## 2022-04-23 DIAGNOSIS — Z66 Do not resuscitate: Secondary | ICD-10-CM

## 2022-04-23 DIAGNOSIS — J441 Chronic obstructive pulmonary disease with (acute) exacerbation: Secondary | ICD-10-CM | POA: Diagnosis not present

## 2022-04-23 DIAGNOSIS — I480 Paroxysmal atrial fibrillation: Secondary | ICD-10-CM | POA: Diagnosis not present

## 2022-04-23 DIAGNOSIS — E871 Hypo-osmolality and hyponatremia: Secondary | ICD-10-CM

## 2022-04-23 DIAGNOSIS — I700 Atherosclerosis of aorta: Secondary | ICD-10-CM | POA: Diagnosis not present

## 2022-04-23 DIAGNOSIS — B338 Other specified viral diseases: Secondary | ICD-10-CM

## 2022-04-23 DIAGNOSIS — E1159 Type 2 diabetes mellitus with other circulatory complications: Secondary | ICD-10-CM | POA: Diagnosis not present

## 2022-04-23 DIAGNOSIS — I10 Essential (primary) hypertension: Secondary | ICD-10-CM

## 2022-04-23 NOTE — Progress Notes (Signed)
Provider:  Dr. Dewayne Shorter Location:  [facility] [place].  Nursing Home Room Number: Gabriel Carina [id number] Place of Service:  ALF (13)  PCP: Dewayne Shorter, MD Patient Care Team: Dewayne Shorter, MD as PCP - General (Family Medicine) End, Harrell Gave, MD as PCP - Cardiology (Cardiology) Leandrew Koyanagi, MD as Referring Physician (Ophthalmology) Lauree Chandler, NP as Nurse Practitioner (Geriatric Medicine)  Extended Emergency Contact Information Primary Emergency Contact: Polo Riley Address: [address]          [place], [place] [address] Johnnette Litter of [place] Phone: [phone] Work Phone: [phone] Relation: Daughter  Code Status: DNR Goals of Care: Advanced Directive information    04/23/2022   11:01 AM  Advanced Directives  Does Patient Have a Medical Advance Directive? Yes  Type of Advance Directive Out of facility DNR (pink MOST or yellow form)  Does patient want to make changes to medical advance directive? No - Patient declined      Chief Complaint  Patient presents with   Admission    Admission.     HPI: Patient is a 89 y.o. female seen today for admission to ALF after hospitalization. Patient was admitted for COPD exercbation in the setting of COPD.   Past Medical History:  Diagnosis Date   Acute pancreatitis 07/24/2021   Basal cell carcinoma 03/30/2008   left upper arm/excision   Basal cell carcinoma 03/31/2008   left upper arm, right lower leg   Basal cell carcinoma 01/18/2009   left upper arm   Collagenous colitis    Diabetes mellitus without complication ([place])    Diverticulitis    Elevated LFTs 07/25/2021   Heart murmur    at birth, none since   History of echocardiogram    a. 11/2015: echo showing EF of 60-65% with no WMA. Grade 1 DD and mild MR noted.    Hyperlipidemia    Per [facility]   Hypertension    Intertrochanteric fracture of right femur ([place]) 06/11/2019   Lack of coordination    Per [facility]  term current use of anticoagulant    Per [facility]   Noninfective gastroenteritis and colitis, unspecified    Per [facility]   PAF (paroxysmal atrial fibrillation) ([place])    a. initially diagnosed in 08/2016 --> started on Eliquis   Squamous cell carcinoma of skin 05/16/2020   Left nasal tip anterior to ala, [facility]   Syncope    a. initially occurring in Fall 2016 b. Hospitalized in 10/2015 and 11/2015 for recurrent episodes.    Past Surgical History:  Procedure Laterality Date   ABDOMINAL HYSTERECTOMY     BACK SURGERY  08/08/2008   Lumbar discectomy   COLONOSCOPY WITH PROPOFOL     EXCISION/RELEASE BURSA HIP Left 05/03/2019   Procedure: HIP ABDUCTOR REPAIR;  Surgeon: Hessie Knows, MD;  Location: [facility] ORS;  Service: Orthopedics;  Laterality: Left;   INTRAMEDULLARY (IM) NAIL INTERTROCHANTERIC Right 06/13/2019   Procedure: INTRAMEDULLARY (IM) NAIL INTERTROCHANTRIC;  Surgeon: Hessie Knows, MD;  Location: [facility] ORS;  Service: Orthopedics;  Laterality: Right;   [place]    reports that she quit smoking about 44 years ago. Her smoking use included cigarettes. She has never used smokeless tobacco. She reports that she does not currently use alcohol. She reports that she does not use drugs. 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  Tobacco Use   Smoking status: Former    Types: Cigarettes    Quit date: 03/09/1978    Years since quitting: 44.1   Smokeless tobacco: Never  Vaping Use   Vaping Use: Never used  Substance and Sexual Activity   Alcohol use: Not Currently    Comment: once a month--mixed drink   Drug use: No   Sexual activity: Not on file  Other Topics Concern   Not on file  Social History Narrative   2 daughters and a son (1  local plus [place] and [place])      Has living will    Daughter Jackelyn Poling is [place]   Has DNR   No feeding tube   Social Determinants of Health   Financial Resource Strain: Low Risk  (04/25/2020)   Overall Financial Resource Strain (CARDIA)    Difficulty of Paying Living Expenses: Not hard at all  Food Insecurity: No Food Insecurity (04/25/2020)   Hunger Vital Sign    Worried About Running Out of Food in the Last Year: Never true    [place] in the Last Year: Never true  Transportation Needs: No Transportation Needs (04/25/2020)   PRAPARE - Hydrologist (Medical): No    Lack of Transportation (Non-Medical): No  Physical Activity: Inactive (04/25/2020)   Exercise Vital Sign    Days of Exercise per Week: 0 days    Minutes of Exercise per Session: 0 min  Stress: No Stress Concern Present (04/25/2020)   [place]    Feeling of Stress : Not at all  Social Connections: Moderately Isolated (04/25/2020)   Social Connection and Isolation Panel [NHANES]    Frequency of Communication with Friends and Family: More than three times a week    Frequency of Social Gatherings with Friends and Family: More than three times a week    Attends Religious Services: More than 4 times per year    Active Member of [employer] or Organizations: No    Attends Archivist Meetings: Never    Marital Status: Widowed  Intimate Partner Violence: Not At Risk (04/25/2020)   Humiliation, Afraid, Rape, and Kick questionnaire    Fear of Current or Ex-Partner: No    Emotionally Abused: No    Physically Abused: No    Sexually Abused: No    Functional Status Survey:    Family History  Problem Relation Age of Onset   Hypertension Mother    Diabetes Mother    Lung cancer Father     Health Maintenance  Topic Date Due   Zoster Vaccines- Shingrix (1 of 2) Never done   OPHTHALMOLOGY EXAM  06/08/2021    COVID-19 Vaccine (8 - 2023-24 season) 03/14/2022   Medicare Annual Wellness (AWV)  04/29/2022   FOOT EXAM  04/29/2022   HEMOGLOBIN A1C  10/16/2022   DTaP/Tdap/Td (5 - Td or Tdap) 02/27/2025   Pneumonia Vaccine 89+ Years old  Completed   INFLUENZA VACCINE  Completed   DEXA SCAN  Completed   HPV VACCINES  Aged Out    Allergies  Allergen Reactions   Morphine Sulfate Other (See Comments)    BP bottoms out   Penicillins Diarrhea    Has patient had a PCN reaction causing immediate rash, facial/tongue/throat swelling, SOB or  lightheadedness with hypotension: Yes Has patient had a PCN reaction causing severe rash involving mucus membranes or skin necrosis: Yes Has patient had a PCN reaction that required hospitalization No Has patient had a PCN reaction occurring within the last 10 years: No If all of the above answers are "NO", then may proceed with Cephalosporin use.    Outpatient Encounter Medications as of 04/23/2022  Medication Sig   acetaminophen (TYLENOL) 325 MG tablet Take 650 mg by mouth 2 (two) times daily.   acetaminophen (TYLENOL) 325 MG tablet Take 650 mg by mouth every 4 (four) hours as needed for mild pain or moderate pain.   albuterol (VENTOLIN HFA) 108 (90 Base) MCG/ACT inhaler Inhale 2 puffs into the lungs every 6 (six) hours as needed for wheezing or shortness of breath.   alum & mag hydroxide-simeth (MAALOX PLUS) 400-400-40 MG/5ML suspension Take 5 mLs by mouth every 4 (four) hours as needed for indigestion.   apixaban (ELIQUIS) 5 MG TABS tablet Take 1 tablet (5 mg total) by mouth 2 (two) times daily.   benzonatate (TESSALON PERLES) 100 MG capsule Take 1 capsule (100 mg total) by mouth 3 (three) times daily as needed for cough.   bismuth subsalicylate (PEPTO BISMOL) 262 MG/15ML suspension Take 30 mLs by mouth as needed.   carbamide peroxide (DEBROX) 6.5 % OTIC solution Place 5 drops into both ears as needed.   cetirizine (ZYRTEC) 10 MG chewable tablet Chew 10 mg by mouth  daily as needed for allergies.   Cholecalciferol (VITAMIN D) 50 MCG (2000 UT) CAPS Give one capsule by mouth once daily .   Clobetasol Propionate 0.05 % shampoo Apply 1 Application topically every morning. And wash out   dextromethorphan-guaiFENesin (ROBITUSSIN-DM) 10-100 MG/5ML liquid Take 5 mLs by mouth every 4 (four) hours as needed for up to 5 days for cough.   dextrose (GLUTOSE) 40 % GEL Take 1 Tube by mouth as needed for low blood sugar.   fluocinolone (SYNALAR) 0.01 % external solution Apply 1 Application topically at bedtime. Apply to scalp   furosemide (LASIX) 20 MG tablet TAKE 1 TABLET BY MOUTH DAILY ONLY IF NEEDED.   glipiZIDE (GLUCOTROL XL) 10 MG 24 hr tablet Take 10 mg by mouth 2 (two) times daily.   ipratropium-albuterol (DUONEB) 0.5-2.5 (3) MG/3ML SOLN Take 3 mLs by nebulization every 4 (four) hours as needed.   linagliptin (TRADJENTA) 5 MG TABS tablet Take 1 tablet (5 mg total) by mouth daily.   lisinopril (ZESTRIL) 20 MG tablet TAKE 1 TABLET BY MOUTH DAILY   magnesium hydroxide (MILK OF MAGNESIA) 400 MG/5ML suspension Take 5 mLs by mouth daily as needed for mild constipation.   metFORMIN (GLUCOPHAGE-XR) 500 MG 24 hr tablet Take 1 tablet (500 mg total) by mouth 2 (two) times daily.   metoprolol succinate (TOPROL-XL) 25 MG 24 hr tablet TAKE 1 TABLET BY MOUTH ONCE DAILY   Multiple Vitamin (MULTIVITAMIN WITH MINERALS) TABS tablet Take 1 tablet by mouth daily.   nystatin (MYCOSTATIN/NYSTOP) powder Apply to excoriated area topically twice daily as needed for skin breakdown   ondansetron (ZOFRAN) 4 MG tablet Take 4 mg by mouth every 6 (six) hours as needed for nausea or vomiting. Take one tablet by mouth three times daily as needed.   OXYGEN 2lpm for dyspnea or SOB   polyethylene glycol (MIRALAX / GLYCOLAX) 17 g packet Take 17 g by mouth daily as needed.   predniSONE (DELTASONE) 20 MG tablet Take 2 tablets (40 mg total) by mouth daily  with breakfast for 5 days.   [DISCONTINUED]  glipiZIDE (GLIPIZIDE XL) 10 MG 24 hr tablet Take 1 tablet (10 mg total) by mouth daily with breakfast.   No facility-administered encounter medications on file as of 04/23/2022.    Review of Systems  All other systems reviewed and are negative.   Vitals:   04/23/22 1043  BP: 139/70  Pulse: 76  Resp: (!) 22  Temp: 98.1 F (36.7 C)  SpO2: 95%  Weight: 196 lb 3.2 oz (89 kg)  Height: 5' (1.524 m)   Body mass index is 38.32 kg/m. Physical Exam Vitals reviewed.  Cardiovascular:     Rate and Rhythm: Normal rate.     Pulses: Normal pulses.  Pulmonary:     Effort: Pulmonary effort is normal.     Breath sounds: Wheezing present.  Abdominal:     General: Abdomen is flat.     Palpations: Abdomen is soft.  Skin:    General: Skin is warm and dry.  Neurological:     Mental Status: She is alert and oriented to person, place, and time.     Labs reviewed: Basic Metabolic Panel: Recent Labs    04/18/22 0557 04/19/22 1537 04/20/22 0503  NA 135 138 136  K 4.1 3.8 4.4  CL 104 105 105  CO2 23 26 22  $ GLUCOSE 307* 136* 411*  BUN 24* 28* 26*  CREATININE 0.92 0.95 0.88  CALCIUM 8.3* 8.9 8.3*   Liver Function Tests: Recent Labs    07/26/21 0334 04/17/22 2020 04/18/22 0557  AST 89* 24 21  ALT 174* 26 25  ALKPHOS 279* 57 48  BILITOT 0.8 1.0 0.8  PROT 5.7* 7.7 6.7  ALBUMIN 2.7* 4.2 3.4*   Recent Labs    07/24/21 1921 07/25/21 0433 07/26/21 0334  LIPASE 1,032* 561* 303*   No results for input(s): "AMMONIA" in the last 8760 hours. CBC: Recent Labs    07/24/21 1610 07/25/21 0433 04/18/22 0557 04/19/22 1537 04/20/22 0503  WBC 10.8*   < > 6.4 7.6 9.6  NEUTROABS 7.5  --  5.1 3.1  --   HGB 15.1*   < > 13.1 13.5 12.3  HCT 46.4*   < > 40.0 40.3 37.4  MCV 92.4   < > 89.7 87.8 89.3  PLT 279   < > 191 202 199   < > = values in this interval not displayed.   Cardiac Enzymes: No results for input(s): "CKTOTAL", "CKMB", "CKMBINDEX", "TROPONINI" in the last 8760  hours. BNP: Invalid input(s): "POCBNP" Lab Results  Component Value Date   HGBA1C 8.3 (H) 04/17/2022   Lab Results  Component Value Date   TSH 2.284 04/17/2022   No results found for: "VITAMINB12" No results found for: "FOLATE" No results found for: "IRON", "TIBC", "FERRITIN"  Imaging and Procedures obtained prior to SNF admission: CT CHEST WO CONTRAST  Result Date: 04/19/2022 CLINICAL DATA:  Cough. EXAM: CT CHEST WITHOUT CONTRAST TECHNIQUE: Multidetector CT imaging of the chest was performed following the standard protocol without IV contrast. RADIATION DOSE REDUCTION: This exam was performed according to the departmental dose-optimization program which includes automated exposure control, adjustment of the mA and/or kV according to patient size and/or use of iterative reconstruction technique. COMPARISON:  Chest radiograph dated 04/19/2022. FINDINGS: Evaluation of this exam is limited in the absence of intravenous contrast. Cardiovascular: There is no cardiomegaly or pericardial effusion. Two vessel coronary vascular calcification involving LAD and RCA. Mild atherosclerotic calcification of the thoracic aorta. No aneurysmal dilatation.  Mildly dilated main pulmonary trunk suggestive of pulmonary hypertension. Clinical correlation is recommended. Mediastinum/Nodes: No hilar or mediastinal adenopathy. The esophagus is grossly unremarkable. No mediastinal fluid collection. Lungs/Pleura: Mild centrilobular emphysema. No focal consolidation, pleural effusion, or pneumothorax. The central airways are patent. Upper Abdomen: Fatty liver.  Colonic diverticulosis. Musculoskeletal: Osteopenia with degenerative changes of the spine. No acute osseous pathology. IMPRESSION: 1. No acute intrathoracic pathology. 2. Mildly dilated main pulmonary trunk suggestive of pulmonary hypertension. 3. Fatty liver. 4. Colonic diverticulosis. 5. Aortic Atherosclerosis (ICD10-I70.0) and Emphysema (ICD10-J43.9). Electronically  Signed   By: Anner Crete M.D.   On: 04/19/2022 19:34   DG Chest 2 View  Result Date: 04/19/2022 CLINICAL DATA:  Shortness of breath EXAM: CHEST - 2 VIEW COMPARISON:  04/17/2022 FINDINGS: Stable cardiomediastinal contours. Persistent elevation of the left hemidiaphragm. No focal airspace consolidation, pleural effusion, or pneumothorax. IMPRESSION: No active cardiopulmonary disease. Electronically Signed   By: Davina Poke D.O.   On: 04/19/2022 15:16    Assessment/Plan COPD exacerbation (HCC)  Aortic atherosclerosis (HCC)  Paroxysmal atrial fibrillation (HCC)  Type 2 diabetes mellitus with other circulatory complication, without long-term current use of insulin ([place])  Essential hypertension  DNR (do not resuscitate)  RSV infection  Hyponatremia Patient readmitted to AL after hospitalization for COPD exacerbation 2/2 RSV infection. Supportive care. No oxygen requirement at this time, however significant wheezing at this time. Will schedule duonebs q4 hours for the next 24 hours then PRN for wheezing. Continue Prednisone 40 mg daily for 5 days. Continue quarantine for 8 total days. Continue metoprolol 25 mg daily. Continue metformin 500 mg BID. Continue lisinopril 20 mg daily. Continue lasix 20 mg daily. Continue glipizide 10 mg BID. Continue Eliquis 5 mg daily. Continue PRN Albuterol.    Family/ staff Communication: nursing  Labs/tests ordered: CBC, BMP

## 2022-05-01 ENCOUNTER — Ambulatory Visit: Payer: Medicare Other | Admitting: Internal Medicine

## 2022-05-08 ENCOUNTER — Other Ambulatory Visit: Payer: Self-pay

## 2022-05-08 ENCOUNTER — Emergency Department: Payer: Medicare Other

## 2022-05-08 ENCOUNTER — Encounter: Payer: Self-pay | Admitting: Emergency Medicine

## 2022-05-08 ENCOUNTER — Emergency Department
Admission: EM | Admit: 2022-05-08 | Discharge: 2022-05-08 | Disposition: A | Discharge status: Home / Self Care | Payer: Medicare Other | Source: Home / Self Care | Attending: Emergency Medicine | Admitting: Emergency Medicine

## 2022-05-08 DIAGNOSIS — R197 Diarrhea, unspecified: Secondary | ICD-10-CM | POA: Insufficient documentation

## 2022-05-08 DIAGNOSIS — I214 Non-ST elevation (NSTEMI) myocardial infarction: Secondary | ICD-10-CM | POA: Diagnosis not present

## 2022-05-08 DIAGNOSIS — Z7901 Long term (current) use of anticoagulants: Secondary | ICD-10-CM | POA: Insufficient documentation

## 2022-05-08 DIAGNOSIS — J449 Chronic obstructive pulmonary disease, unspecified: Secondary | ICD-10-CM | POA: Insufficient documentation

## 2022-05-08 DIAGNOSIS — R1031 Right lower quadrant pain: Secondary | ICD-10-CM | POA: Insufficient documentation

## 2022-05-08 DIAGNOSIS — F0390 Unspecified dementia without behavioral disturbance: Secondary | ICD-10-CM | POA: Insufficient documentation

## 2022-05-08 DIAGNOSIS — E119 Type 2 diabetes mellitus without complications: Secondary | ICD-10-CM | POA: Insufficient documentation

## 2022-05-08 DIAGNOSIS — Z20822 Contact with and (suspected) exposure to covid-19: Secondary | ICD-10-CM | POA: Insufficient documentation

## 2022-05-08 DIAGNOSIS — R7989 Other specified abnormal findings of blood chemistry: Secondary | ICD-10-CM | POA: Diagnosis not present

## 2022-05-08 DIAGNOSIS — R748 Abnormal levels of other serum enzymes: Secondary | ICD-10-CM | POA: Insufficient documentation

## 2022-05-08 DIAGNOSIS — R112 Nausea with vomiting, unspecified: Secondary | ICD-10-CM | POA: Insufficient documentation

## 2022-05-08 DIAGNOSIS — I10 Essential (primary) hypertension: Secondary | ICD-10-CM | POA: Insufficient documentation

## 2022-05-08 LAB — RESP PANEL BY RT-PCR (RSV, FLU A&B, COVID)  RVPGX2
Influenza A by PCR: NEGATIVE
Influenza B by PCR: NEGATIVE
Resp Syncytial Virus by PCR: NEGATIVE
SARS Coronavirus 2 by RT PCR: NEGATIVE

## 2022-05-08 LAB — COMPREHENSIVE METABOLIC PANEL
ALT: 46 U/L — ABNORMAL HIGH (ref 0–44)
AST: 53 U/L — ABNORMAL HIGH (ref 15–41)
Albumin: 3.5 g/dL (ref 3.5–5.0)
Alkaline Phosphatase: 50 U/L (ref 38–126)
Anion gap: 9 (ref 5–15)
BUN: 27 mg/dL — ABNORMAL HIGH (ref 8–23)
CO2: 26 mmol/L (ref 22–32)
Calcium: 9.4 mg/dL (ref 8.9–10.3)
Chloride: 105 mmol/L (ref 98–111)
Creatinine, Ser: 0.94 mg/dL (ref 0.44–1.00)
GFR, Estimated: 58 mL/min — ABNORMAL LOW (ref >60)
Glucose, Bld: 327 mg/dL — ABNORMAL HIGH (ref 70–99)
Potassium: 4.1 mmol/L (ref 3.5–5.1)
Sodium: 140 mmol/L (ref 135–145)
Total Bilirubin: 0.9 mg/dL (ref 0.3–1.2)
Total Protein: 6.7 g/dL (ref 6.5–8.1)

## 2022-05-08 LAB — URINALYSIS, ROUTINE W REFLEX MICROSCOPIC
Bilirubin Urine: NEGATIVE
Glucose, UA: >=500 mg/dL — AB
Ketones, ur: 20 mg/dL — AB
Leukocytes,Ua: NEGATIVE
Nitrite: NEGATIVE
Protein, ur: NEGATIVE mg/dL
Specific Gravity, Urine: 1.012 (ref 1.005–1.030)
pH: 5 (ref 5.0–8.0)

## 2022-05-08 LAB — CBC
HCT: 39.2 % (ref 36.0–46.0)
Hemoglobin: 13 g/dL (ref 12.0–15.0)
MCH: 29.7 pg (ref 26.0–34.0)
MCHC: 33.2 g/dL (ref 30.0–36.0)
MCV: 89.7 fL (ref 80.0–100.0)
Platelets: 193 10*3/uL (ref 150–400)
RBC: 4.37 MIL/uL (ref 3.87–5.11)
RDW: 11.8 % (ref 11.5–15.5)
WBC: 10.4 10*3/uL (ref 4.0–10.5)
nRBC: 0 % (ref 0.0–0.2)

## 2022-05-08 LAB — TROPONIN I (HIGH SENSITIVITY)
Troponin I (High Sensitivity): 12 ng/L (ref <18)
Troponin I (High Sensitivity): 20 ng/L — ABNORMAL HIGH (ref <18)
Troponin I (High Sensitivity): 22 ng/L — ABNORMAL HIGH (ref <18)

## 2022-05-08 LAB — LIPASE, BLOOD: Lipase: 390 U/L — ABNORMAL HIGH (ref 11–51)

## 2022-05-08 MED ORDER — IOHEXOL 300 MG/ML  SOLN
100.0000 mL | Freq: Once | INTRAMUSCULAR | Status: AC | PRN
  Administered 2022-05-08: 100 mL via INTRAVENOUS

## 2022-05-08 MED ORDER — SODIUM CHLORIDE 0.9 % IV BOLUS
500.0000 mL | Freq: Once | INTRAVENOUS | Status: AC
  Administered 2022-05-08: 500 mL via INTRAVENOUS

## 2022-05-08 MED ORDER — ONDANSETRON 4 MG PO TBDP: 4.0000 mg | ORAL_TABLET | Freq: Three times a day (TID) | ORAL | 0 refills | Status: DC | PRN

## 2022-05-08 MED ORDER — ONDANSETRON HCL 4 MG/2ML IJ SOLN
4.0000 mg | Freq: Once | INTRAMUSCULAR | Status: AC
  Administered 2022-05-08: 4 mg via INTRAVENOUS
  Filled 2022-05-08: qty 2

## 2022-05-08 NOTE — ED Triage Notes (Addendum)
Pt arrived via [facility] from [facility] from [place], pt c/o N/V/D that started this morning. Pt also c/o weakness. Pt has hx of RSV about 1 month ago. Staff reported to EMS that pt was altered more than normal. Per EMS pt has been answering questions appropriately.   Pt does have abdominal tenderness when palpating on the R side.

## 2022-05-08 NOTE — Discharge Instructions (Addendum)
Return to the ER for new, worsening, or persistent severe nausea, vomiting, diarrhea, blood in the stool or vomit, abdominal pain, fever, weakness, or any other new or worsening symptoms that concern you.  Your lipase (the pancreas enzyme) is 390 which is similar to what it was after your pancreatitis episode last year resolved.  There are no signs of acute pancreatitis right now.  This should be followed by your regular doctor.

## 2022-05-08 NOTE — ED Provider Notes (Signed)
[facility] Provider Note    Event Date/Time   First MD Initiated Contact with Patient 05/08/22 [phone]     (approximate)   History   Emesis, Diarrhea, and Weakness   HPI  [name] is a 89 y.o. female with a history of COPD, hypertension, paroxysmal atrial fibrillation on Eliquis, diabetes, and dementia who presents with nausea, vomiting, and diarrhea acute onset early this morning.  The patient denies any abdominal pain.  She does feel weak.  She is unable to give much other history.  I reviewed the past medical records.  The patient was admitted earlier this month.  Per the hospitalist discharge summary from 2/13 she presented with dyspnea, cough, wheezing, and was diagnosed with RSV and COPD exacerbation.   Physical Exam   Triage Vital Signs: ED Triage Vitals  Enc Vitals Group     BP 05/08/22 0626 (!) 180/65     Pulse Rate 05/08/22 0626 92     Resp 05/08/22 0626 20     Temp 05/08/22 0626 99 F (37.2 C)     Temp Source 05/08/22 0626 Oral     SpO2 05/08/22 0625 97 %     Weight 05/08/22 0630 196 lb 3.2 oz (89 kg)     Height 05/08/22 0630 5' (1.524 m)     Head Circumference --      Peak Flow --      Pain Score --      Pain Loc --      Pain Edu? --      Excl. in [place]? --     Most recent vital signs: Vitals:   05/08/22 0930 05/08/22 1030  BP: (!) 147/62 (!) 138/56  Pulse: 90 86  Resp:    Temp:    SpO2: 93% 94%     General: Awake, no distress.  CV:  Good peripheral perfusion.  Resp:  Normal effort.  Abd:  Mild right lower quadrant tenderness.  No peritoneal signs.  No distention.  Other:  Dry mucous membranes.   ED Results / Procedures / Treatments   Labs (all labs ordered are listed, but only abnormal results are displayed) Labs Reviewed  LIPASE, BLOOD - Abnormal; Notable for the following components:      Result Value   Lipase 390 (*)    All other components within normal limits  COMPREHENSIVE METABOLIC PANEL - Abnormal;  Notable for the following components:   Glucose, Bld 327 (*)    BUN 27 (*)    AST 53 (*)    ALT 46 (*)    GFR, Estimated 58 (*)    All other components within normal limits  URINALYSIS, ROUTINE W REFLEX MICROSCOPIC - Abnormal; Notable for the following components:   Color, Urine YELLOW (*)    APPearance HAZY (*)    Glucose, UA >=500 (*)    Hgb urine dipstick SMALL (*)    Ketones, ur 20 (*)    Bacteria, UA MANY (*)    All other components within normal limits  TROPONIN I (HIGH SENSITIVITY) - Abnormal; Notable for the following components:   Troponin I (High Sensitivity) 20 (*)    All other components within normal limits  TROPONIN I (HIGH SENSITIVITY) - Abnormal; Notable for the following components:   Troponin I (High Sensitivity) 22 (*)    All other components within normal limits  RESP PANEL BY RT-PCR (RSV, FLU A&B, COVID)  [id number]  CBC  TROPONIN I (HIGH SENSITIVITY)  EKG  ED ECG REPORT I, Arta Silence, the attending physician, personally viewed and interpreted this ECG.  Date: 05/08/2022 EKG Time: 0632 Rate: 97 Rhythm: normal sinus rhythm QRS Axis: normal Intervals: normal ST/T Wave abnormalities: normal Narrative Interpretation: no evidence of acute ischemia    RADIOLOGY  Chest x-ray: I independently viewed and interpreted the images; there is no focal consolidation or edema.  The stomach appears distended.  CT abdomen/pelvis:   IMPRESSION:  1. No acute findings within the abdomen or pelvis.  2. Diffuse colonic diverticulosis without signs of acute  diverticulitis.  3. Fat containing umbilical hernia.  4. Subjective hepatic steatosis.  5.  Aortic Atherosclerosis (ICD10-I70.0).    PROCEDURES:  Critical Care performed: No  Procedures   MEDICATIONS ORDERED IN ED: Medications  sodium chloride 0.9 % bolus 500 mL (0 mLs Intravenous Stopped 05/08/22 1101)  ondansetron (ZOFRAN) injection 4 mg (4 mg Intravenous Given 05/08/22 0731)  iohexol  (OMNIPAQUE) 300 MG/ML solution 100 mL (100 mLs Intravenous Contrast Given 05/08/22 0816)     IMPRESSION / MDM / ASSESSMENT AND PLAN / ED COURSE  I reviewed the triage vital signs and the nursing notes.  89 year old female with PMH as noted above presents with acute onset of nausea, vomiting, and diarrhea early this morning.  She does not report any abdominal pain but is tender in the right lower abdomen on palpation.  Differential diagnosis includes, but is not limited to, gastroenteritis, colitis, diverticulitis, gastritis, SBO.  We will obtain lab workup, CT abdomen/pelvis, give a fluid bolus, antiemetic, and reassess.  Patient's presentation is most consistent with acute presentation with potential threat to life or bodily function.  The patient is on the cardiac monitor to evaluate for evidence of arrhythmia and/or significant heart rate changes.  ----------------------------------------- 12:20 PM on 05/08/2022 -----------------------------------------  CT shows no acute abnormality.  Lab workup is reassuring.  There is no leukocytosis.  Urinalysis is negative for evidence of UTI.  Respiratory panel is negative.  Initial troponin was 12.  Repeat was 20.  The patient's symptoms completely resolved and she is now tolerating p.o.  A third troponin showed no significant change.  Therefore, there is no evidence of ACS or indication for further cardiac workup.  The lipase is also somewhat elevated.  The patient was admitted for acute pancreatitis in May of last year with a lipase of over 1000 and at the time of her discharge it was in the 300s.  I suspect that the patient may have some component of chronic pancreatitis.  Given the lack of any concerning CT findings and the resolution of the symptoms, there is no clinical evidence of acute pancreatitis or indication for further treatment.  The patient has no elevated bilirubin, alk phos, or other evidence of biliary obstruction.  I counseled the  patient and her daughter extensively on the results of the workup.  The patient states she is feeling much better and would like to go home.  Given the reassuring workup I think that this is reasonable.  I gave strict return precautions and they expressed understanding.   FINAL CLINICAL IMPRESSION(S) / ED DIAGNOSES   Final diagnoses:  Nausea vomiting and diarrhea  Elevated lipase     Rx / DC Orders   ED Discharge Orders          Ordered    ondansetron (ZOFRAN-ODT) 4 MG disintegrating tablet  Every 8 hours PRN        05/08/22 1221  Note:  This document was prepared using Dragon voice recognition software and may include unintentional dictation errors.    Arta Silence, MD 05/08/22 [phone]

## 2022-05-09 ENCOUNTER — Inpatient Hospital Stay
Admission: EM | Admit: 2022-05-09 | Discharge: 2022-05-14 | DRG: 440 | Disposition: A | Discharge status: Skilled Nursing Facility | Payer: Medicare Other | Source: Skilled Nursing Facility | Attending: Osteopathic Medicine | Admitting: Osteopathic Medicine

## 2022-05-09 ENCOUNTER — Other Ambulatory Visit: Payer: Self-pay

## 2022-05-09 ENCOUNTER — Emergency Department: Payer: Medicare Other

## 2022-05-09 DIAGNOSIS — E1165 Type 2 diabetes mellitus with hyperglycemia: Secondary | ICD-10-CM | POA: Diagnosis present

## 2022-05-09 DIAGNOSIS — J449 Chronic obstructive pulmonary disease, unspecified: Secondary | ICD-10-CM | POA: Diagnosis present

## 2022-05-09 DIAGNOSIS — F0390 Unspecified dementia without behavioral disturbance: Secondary | ICD-10-CM | POA: Diagnosis present

## 2022-05-09 DIAGNOSIS — T378X5A Adverse effect of other specified systemic anti-infectives and antiparasitics, initial encounter: Secondary | ICD-10-CM | POA: Diagnosis present

## 2022-05-09 DIAGNOSIS — I10 Essential (primary) hypertension: Secondary | ICD-10-CM | POA: Diagnosis present

## 2022-05-09 DIAGNOSIS — R931 Abnormal findings on diagnostic imaging of heart and coronary circulation: Secondary | ICD-10-CM

## 2022-05-09 DIAGNOSIS — K52831 Collagenous colitis: Secondary | ICD-10-CM

## 2022-05-09 DIAGNOSIS — Z87891 Personal history of nicotine dependence: Secondary | ICD-10-CM | POA: Diagnosis not present

## 2022-05-09 DIAGNOSIS — R7989 Other specified abnormal findings of blood chemistry: Secondary | ICD-10-CM | POA: Diagnosis present

## 2022-05-09 DIAGNOSIS — I214 Non-ST elevation (NSTEMI) myocardial infarction: Secondary | ICD-10-CM | POA: Diagnosis present

## 2022-05-09 DIAGNOSIS — Z7984 Long term (current) use of oral hypoglycemic drugs: Secondary | ICD-10-CM | POA: Diagnosis not present

## 2022-05-09 DIAGNOSIS — I2489 Other forms of acute ischemic heart disease: Secondary | ICD-10-CM | POA: Diagnosis not present

## 2022-05-09 DIAGNOSIS — D692 Other nonthrombocytopenic purpura: Secondary | ICD-10-CM

## 2022-05-09 DIAGNOSIS — Y92092 Bedroom in other non-institutional residence as the place of occurrence of the external cause: Secondary | ICD-10-CM

## 2022-05-09 DIAGNOSIS — M25551 Pain in right hip: Secondary | ICD-10-CM

## 2022-05-09 DIAGNOSIS — E785 Hyperlipidemia, unspecified: Secondary | ICD-10-CM

## 2022-05-09 DIAGNOSIS — I480 Paroxysmal atrial fibrillation: Secondary | ICD-10-CM | POA: Diagnosis present

## 2022-05-09 DIAGNOSIS — W06XXXA Fall from bed, initial encounter: Secondary | ICD-10-CM | POA: Diagnosis present

## 2022-05-09 DIAGNOSIS — K5730 Diverticulosis of large intestine without perforation or abscess without bleeding: Secondary | ICD-10-CM | POA: Diagnosis present

## 2022-05-09 DIAGNOSIS — Z66 Do not resuscitate: Secondary | ICD-10-CM

## 2022-05-09 DIAGNOSIS — Z90710 Acquired absence of both cervix and uterus: Secondary | ICD-10-CM

## 2022-05-09 DIAGNOSIS — E669 Obesity, unspecified: Secondary | ICD-10-CM

## 2022-05-09 DIAGNOSIS — E119 Type 2 diabetes mellitus without complications: Secondary | ICD-10-CM | POA: Diagnosis not present

## 2022-05-09 DIAGNOSIS — E871 Hypo-osmolality and hyponatremia: Secondary | ICD-10-CM

## 2022-05-09 DIAGNOSIS — W1830XA Fall on same level, unspecified, initial encounter: Secondary | ICD-10-CM

## 2022-05-09 DIAGNOSIS — Z1152 Encounter for screening for COVID-19: Secondary | ICD-10-CM

## 2022-05-09 DIAGNOSIS — Z7901 Long term (current) use of anticoagulants: Secondary | ICD-10-CM | POA: Diagnosis not present

## 2022-05-09 DIAGNOSIS — R899 Unspecified abnormal finding in specimens from other organs, systems and tissues: Secondary | ICD-10-CM

## 2022-05-09 DIAGNOSIS — M1990 Unspecified osteoarthritis, unspecified site: Secondary | ICD-10-CM

## 2022-05-09 DIAGNOSIS — J432 Centrilobular emphysema: Secondary | ICD-10-CM | POA: Diagnosis not present

## 2022-05-09 DIAGNOSIS — K859 Acute pancreatitis without necrosis or infection, unspecified: Principal | ICD-10-CM

## 2022-05-09 DIAGNOSIS — Z9981 Dependence on supplemental oxygen: Secondary | ICD-10-CM

## 2022-05-09 DIAGNOSIS — L719 Rosacea, unspecified: Secondary | ICD-10-CM

## 2022-05-09 DIAGNOSIS — C4491 Basal cell carcinoma of skin, unspecified: Secondary | ICD-10-CM

## 2022-05-09 DIAGNOSIS — R296 Repeated falls: Secondary | ICD-10-CM | POA: Diagnosis not present

## 2022-05-09 DIAGNOSIS — Z880 Allergy status to penicillin: Secondary | ICD-10-CM

## 2022-05-09 DIAGNOSIS — K861 Other chronic pancreatitis: Secondary | ICD-10-CM | POA: Diagnosis present

## 2022-05-09 DIAGNOSIS — K760 Fatty (change of) liver, not elsewhere classified: Secondary | ICD-10-CM | POA: Diagnosis present

## 2022-05-09 DIAGNOSIS — Z833 Family history of diabetes mellitus: Secondary | ICD-10-CM

## 2022-05-09 DIAGNOSIS — J441 Chronic obstructive pulmonary disease with (acute) exacerbation: Secondary | ICD-10-CM | POA: Diagnosis present

## 2022-05-09 DIAGNOSIS — Z8249 Family history of ischemic heart disease and other diseases of the circulatory system: Secondary | ICD-10-CM

## 2022-05-09 DIAGNOSIS — E1159 Type 2 diabetes mellitus with other circulatory complications: Secondary | ICD-10-CM

## 2022-05-09 DIAGNOSIS — Z85828 Personal history of other malignant neoplasm of skin: Secondary | ICD-10-CM

## 2022-05-09 DIAGNOSIS — Z801 Family history of malignant neoplasm of trachea, bronchus and lung: Secondary | ICD-10-CM

## 2022-05-09 DIAGNOSIS — E559 Vitamin D deficiency, unspecified: Secondary | ICD-10-CM

## 2022-05-09 DIAGNOSIS — B338 Other specified viral diseases: Secondary | ICD-10-CM

## 2022-05-09 DIAGNOSIS — Z79899 Other long term (current) drug therapy: Secondary | ICD-10-CM

## 2022-05-09 DIAGNOSIS — R600 Localized edema: Secondary | ICD-10-CM

## 2022-05-09 DIAGNOSIS — L8990 Pressure ulcer of unspecified site, unspecified stage: Secondary | ICD-10-CM

## 2022-05-09 DIAGNOSIS — N179 Acute kidney failure, unspecified: Secondary | ICD-10-CM | POA: Diagnosis present

## 2022-05-09 DIAGNOSIS — I700 Atherosclerosis of aorta: Secondary | ICD-10-CM

## 2022-05-09 DIAGNOSIS — Z885 Allergy status to narcotic agent status: Secondary | ICD-10-CM

## 2022-05-09 LAB — CBC WITH DIFFERENTIAL/PLATELET
Abs Immature Granulocytes: 0.09 10*3/uL — ABNORMAL HIGH (ref 0.00–0.07)
Basophils Absolute: 0 10*3/uL (ref 0.0–0.1)
Basophils Relative: 0 %
Eosinophils Absolute: 0.1 10*3/uL (ref 0.0–0.5)
Eosinophils Relative: 1 %
HCT: 38.6 % (ref 36.0–46.0)
Hemoglobin: 12.5 g/dL (ref 12.0–15.0)
Immature Granulocytes: 1 %
Lymphocytes Relative: 10 %
Lymphs Abs: 1.1 10*3/uL (ref 0.7–4.0)
MCH: 29.6 pg (ref 26.0–34.0)
MCHC: 32.4 g/dL (ref 30.0–36.0)
MCV: 91.3 fL (ref 80.0–100.0)
Monocytes Absolute: 0.9 10*3/uL (ref 0.1–1.0)
Monocytes Relative: 8 %
Neutro Abs: 9.5 10*3/uL — ABNORMAL HIGH (ref 1.7–7.7)
Neutrophils Relative %: 80 %
Platelets: 183 10*3/uL (ref 150–400)
RBC: 4.23 MIL/uL (ref 3.87–5.11)
RDW: 12.1 % (ref 11.5–15.5)
WBC: 11.8 10*3/uL — ABNORMAL HIGH (ref 4.0–10.5)
nRBC: 0 % (ref 0.0–0.2)

## 2022-05-09 LAB — COMPREHENSIVE METABOLIC PANEL
ALT: 310 U/L — ABNORMAL HIGH (ref 0–44)
AST: 331 U/L — ABNORMAL HIGH (ref 15–41)
Albumin: 3.5 g/dL (ref 3.5–5.0)
Alkaline Phosphatase: 92 U/L (ref 38–126)
Anion gap: 13 (ref 5–15)
BUN: 27 mg/dL — ABNORMAL HIGH (ref 8–23)
CO2: 24 mmol/L (ref 22–32)
Calcium: 9.5 mg/dL (ref 8.9–10.3)
Chloride: 101 mmol/L (ref 98–111)
Creatinine, Ser: 1.08 mg/dL — ABNORMAL HIGH (ref 0.44–1.00)
GFR, Estimated: 49 mL/min — ABNORMAL LOW (ref >60)
Glucose, Bld: 411 mg/dL — ABNORMAL HIGH (ref 70–99)
Potassium: 4.4 mmol/L (ref 3.5–5.1)
Sodium: 138 mmol/L (ref 135–145)
Total Bilirubin: 1.8 mg/dL — ABNORMAL HIGH (ref 0.3–1.2)
Total Protein: 7.5 g/dL (ref 6.5–8.1)

## 2022-05-09 LAB — LIPASE, BLOOD: Lipase: 562 U/L — ABNORMAL HIGH (ref 11–51)

## 2022-05-09 LAB — URINALYSIS, ROUTINE W REFLEX MICROSCOPIC
Bilirubin Urine: NEGATIVE
Glucose, UA: >=500 mg/dL — AB
Hgb urine dipstick: NEGATIVE
Ketones, ur: 20 mg/dL — AB
Leukocytes,Ua: NEGATIVE
Nitrite: NEGATIVE
Protein, ur: 30 mg/dL — AB
Specific Gravity, Urine: 1.022 (ref 1.005–1.030)
pH: 5 (ref 5.0–8.0)

## 2022-05-09 LAB — LIPID PANEL
Cholesterol: 202 mg/dL — ABNORMAL HIGH (ref 0–200)
HDL: 50 mg/dL (ref >40)
LDL Cholesterol: 125 mg/dL — ABNORMAL HIGH (ref 0–99)
Total CHOL/HDL Ratio: 4 RATIO
Triglycerides: 133 mg/dL (ref <150)
VLDL: 27 mg/dL (ref 0–40)

## 2022-05-09 LAB — HEPARIN LEVEL (UNFRACTIONATED): Heparin Unfractionated: >1.1 IU/mL — ABNORMAL HIGH (ref 0.30–0.70)

## 2022-05-09 LAB — APTT: aPTT: 39 seconds — ABNORMAL HIGH (ref 24–36)

## 2022-05-09 LAB — GLUCOSE, CAPILLARY
Glucose-Capillary: 410 mg/dL — ABNORMAL HIGH (ref 70–99)
Glucose-Capillary: 313 mg/dL — ABNORMAL HIGH (ref 70–99)

## 2022-05-09 LAB — TROPONIN I (HIGH SENSITIVITY)
Troponin I (High Sensitivity): 964 ng/L (ref <18)
Troponin I (High Sensitivity): 846 ng/L (ref <18)

## 2022-05-09 LAB — PROTIME-INR
INR: 1.7 — ABNORMAL HIGH (ref 0.8–1.2)
Prothrombin Time: 19.5 seconds — ABNORMAL HIGH (ref 11.4–15.2)

## 2022-05-09 MED ORDER — LACTATED RINGERS IV SOLN: INTRAVENOUS | Status: AC

## 2022-05-09 MED ORDER — NITROGLYCERIN 0.4 MG SL SUBL: 0.4000 mg | SUBLINGUAL_TABLET | SUBLINGUAL | Status: DC | PRN

## 2022-05-09 MED ORDER — SODIUM CHLORIDE 0.9 % IV SOLN: Freq: Once | INTRAVENOUS | Status: AC

## 2022-05-09 MED ORDER — ASPIRIN 300 MG RE SUPP: 300.0000 mg | RECTAL | Status: AC

## 2022-05-09 MED ORDER — METOPROLOL SUCCINATE ER 25 MG PO TB24
25.0000 mg | ORAL_TABLET | Freq: Every day | ORAL | Status: DC
  Administered 2022-05-10 – 2022-05-14 (×5): 25 mg via ORAL
  Filled 2022-05-09 (×5): qty 1

## 2022-05-09 MED ORDER — INSULIN ASPART 100 UNIT/ML IJ SOLN
0.0000 [IU] | Freq: Three times a day (TID) | INTRAMUSCULAR | Status: DC
  Administered 2022-05-09: 15 [IU] via SUBCUTANEOUS
  Administered 2022-05-10 (×3): 3 [IU] via SUBCUTANEOUS
  Administered 2022-05-11 (×2): 5 [IU] via SUBCUTANEOUS
  Administered 2022-05-11: 3 [IU] via SUBCUTANEOUS
  Administered 2022-05-12 (×2): 5 [IU] via SUBCUTANEOUS
  Administered 2022-05-12: 8 [IU] via SUBCUTANEOUS
  Administered 2022-05-13: 5 [IU] via SUBCUTANEOUS
  Administered 2022-05-13: 8 [IU] via SUBCUTANEOUS
  Administered 2022-05-13: 3 [IU] via SUBCUTANEOUS
  Administered 2022-05-14 (×2): 5 [IU] via SUBCUTANEOUS
  Filled 2022-05-09 (×14): qty 1

## 2022-05-09 MED ORDER — HEPARIN (PORCINE) 25000 UT/250ML-% IV SOLN
1150.0000 [IU]/h | INTRAVENOUS | Status: AC
  Administered 2022-05-09: 750 [IU]/h via INTRAVENOUS
  Filled 2022-05-09 (×2): qty 250

## 2022-05-09 MED ORDER — INSULIN GLARGINE-YFGN 100 UNIT/ML ~~LOC~~ SOLN
10.0000 [IU] | Freq: Every day | SUBCUTANEOUS | Status: DC
  Administered 2022-05-09 – 2022-05-13 (×5): 10 [IU] via SUBCUTANEOUS
  Filled 2022-05-09 (×6): qty 0.1

## 2022-05-09 MED ORDER — ALBUTEROL SULFATE (2.5 MG/3ML) 0.083% IN NEBU: 3.0000 mL | INHALATION_SOLUTION | Freq: Four times a day (QID) | RESPIRATORY_TRACT | Status: DC | PRN

## 2022-05-09 MED ORDER — ASPIRIN 81 MG PO TBEC
81.0000 mg | DELAYED_RELEASE_TABLET | Freq: Every day | ORAL | Status: DC
  Administered 2022-05-10 – 2022-05-12 (×3): 81 mg via ORAL
  Filled 2022-05-09 (×3): qty 1

## 2022-05-09 MED ORDER — ASPIRIN 81 MG PO CHEW
324.0000 mg | CHEWABLE_TABLET | ORAL | Status: AC
  Administered 2022-05-09: 324 mg via ORAL
  Filled 2022-05-09: qty 4

## 2022-05-09 MED ORDER — LACTATED RINGERS IV BOLUS
1000.0000 mL | Freq: Once | INTRAVENOUS | Status: AC
  Administered 2022-05-09: 1000 mL via INTRAVENOUS

## 2022-05-09 MED ORDER — ONDANSETRON HCL 4 MG/2ML IJ SOLN
4.0000 mg | Freq: Four times a day (QID) | INTRAMUSCULAR | Status: DC | PRN
  Administered 2022-05-11: 4 mg via INTRAVENOUS
  Filled 2022-05-09: qty 2

## 2022-05-09 MED ORDER — ACETAMINOPHEN 325 MG PO TABS
650.0000 mg | ORAL_TABLET | ORAL | Status: DC | PRN
  Administered 2022-05-10 – 2022-05-11 (×4): 650 mg via ORAL
  Filled 2022-05-09 (×4): qty 2

## 2022-05-09 MED ORDER — HEPARIN BOLUS VIA INFUSION
3000.0000 [IU] | Freq: Once | INTRAVENOUS | Status: AC
  Administered 2022-05-09: 3000 [IU] via INTRAVENOUS
  Filled 2022-05-09: qty 3000

## 2022-05-09 NOTE — Assessment & Plan Note (Signed)
Patient presenting after rolling off bed.  Her troponin on presentation elevated as high as 960, with downtrend.  No EKG changes and patient is chest pain-free (although patient is a poor historian).  However she has multiple risk factors including uncontrolled type 2 diabetes, uncontrolled hyperlipidemia, and hypertension.  Due to this, will initiate heparin infusion and discussed with cardiology.  - Cardiology consulted; appreciate their recommendations - Continue heparin infusion per pharmacy dosing - S/p aspirin 324 mg once - Start aspirin 81 mg daily tomorrow - Hold off on initiating statin given elevated LFTs - Echocardiogram ordered

## 2022-05-09 NOTE — Assessment & Plan Note (Signed)
Likely prerenal in the setting of NSTEMI and acute pancreatitis  - Repeat BMP in the a.m. - IV fluids as ordered - Hold home nephrotoxic agents - Bladder scan

## 2022-05-09 NOTE — Assessment & Plan Note (Signed)
-   Continue home metoprolol - Hold home Eliquis given heparin infusion

## 2022-05-09 NOTE — Assessment & Plan Note (Signed)
Per patient, she rolled off the bed when she was asleep, however she cannot recall the event.  No injuries noted on exam and CT head without any acute intracranial abnormalities.  -PT/OT

## 2022-05-09 NOTE — Assessment & Plan Note (Signed)
History of uncontrolled diabetes with CBG as high as 411 on admission.  Last A1c approximately 3 weeks ago at 8.3%  - Hold home antiglycemic agents - Start SSI, moderate - Start Semglee 10 units at bedtime

## 2022-05-09 NOTE — H&P (Addendum)
History and Physical    [name] [id number] DOB: Jan 27, 1932 DOA: 05/09/2022 DOS: the patient was seen and examined on 05/09/2022 PCP: Dewayne Shorter, MD  Patient coming from: ALF/ILF  Chief Complaint:  Chief Complaint  Patient presents with   Fall   HPI: [name] is a 89 y.o. female with medical history significant of atrial fibrillation on Eliquis, uncontrolled type 2 diabetes, hypertension, hyperlipidemia, idiopathic pancreatitis, collagenous colitis, diverticulitis, who presents to the ED after a ground-level fall.  [name] states that she must of rolled off her bed in her sleep and she does not remember the event happening.  She denies any symptoms at this time including chest pain, nausea, vomiting, abdominal pain, shortness of breath, palpitations.  Patient's daughter at bedside, Neoma Laming states that yesterday they were in the ER for nausea, vomiting and [name] had abdominal pain at that time. [name] cannot recall this.   Per patient's daughter, [name] was started on Macrobid approximately 2 weeks ago for a UTI.   ED course: On arrival to the ED, patient was normotensive at 132/67 with heart rate of 86.  She was saturating at 95% on room air.  She was afebrile at 98.8.  Initial workup remarkable for WBC of 11.8, hemoglobin 12.5, platelets of 183, potassium 4.4, BUN 27, creatinine 1.08, AST 331, ALT 310, total bilirubin of 1.8 and GFR 49.  CT of the head and C-spine were obtained that did not show any acute abnormalities with the exception of mild soft tissue swelling over the left frontal scalp.  Chest x-ray was obtained that demonstrated mild streaky bibasilar opacities likely representing atelectasis.  TRH contacted for admission.  Review of Systems: As mentioned in the history of present illness. All other systems reviewed and are negative.  Past Medical History:  Diagnosis Date   Acute pancreatitis 07/24/2021   Basal cell carcinoma  03/30/2008   left upper arm/excision   Basal cell carcinoma 03/31/2008   left upper arm, right lower leg   Basal cell carcinoma 01/18/2009   left upper arm   Collagenous colitis    Diabetes mellitus without complication ([place])    Diverticulitis    Elevated LFTs 07/25/2021   Heart murmur    at birth, none since   History of echocardiogram    a. 11/2015: echo showing EF of 60-65% with no WMA. Grade 1 DD and mild MR noted.    Hyperlipidemia    Per [facility]   Hypertension    Intertrochanteric fracture of right femur ([place]) 06/11/2019   Lack of coordination    Per [facility] term current use of anticoagulant    Per [facility]   Noninfective gastroenteritis and colitis, unspecified    Per [facility]   PAF (paroxysmal atrial fibrillation) ([place])    a. initially diagnosed in 08/2016 --> started on Eliquis   Squamous cell carcinoma of skin 05/16/2020   Left nasal tip anterior to ala, refer to [facility]   Syncope    a. initially occurring in Fall 2016 b. Hospitalized in 10/2015 and 11/2015 for recurrent episodes.    Past Surgical History:  Procedure Laterality Date   ABDOMINAL HYSTERECTOMY     BACK SURGERY  08/08/2008   Lumbar discectomy   COLONOSCOPY WITH PROPOFOL     EXCISION/RELEASE BURSA HIP Left 05/03/2019   Procedure: HIP ABDUCTOR REPAIR;  Surgeon: Hessie Knows, MD;  Location: [facility] ORS;  Service: Orthopedics;  Laterality: Left;   INTRAMEDULLARY (IM)  NAIL INTERTROCHANTERIC Right 06/13/2019   Procedure: INTRAMEDULLARY (IM) NAIL INTERTROCHANTRIC;  Surgeon: Hessie Knows, MD;  Location: [facility] ORS;  Service: Orthopedics;  Laterality: Right;   NECK SURGERY  1980   Social History:  reports that she quit smoking about 44 years ago. Her smoking use included cigarettes. She has never used smokeless tobacco. She reports that she does not currently use alcohol. She reports that she does not use drugs.  Allergies  Allergen Reactions   Morphine Sulfate Other (See Comments)    BP bottoms  out   Penicillins Diarrhea    Has patient had a PCN reaction causing immediate rash, facial/tongue/throat swelling, SOB or lightheadedness with hypotension: Yes Has patient had a PCN reaction causing severe rash involving mucus membranes or skin necrosis: Yes Has patient had a PCN reaction that required hospitalization No Has patient had a PCN reaction occurring within the last 10 years: No If all of the above answers are "NO", then may proceed with Cephalosporin use.    Family History  Problem Relation Age of Onset   Hypertension Mother    Diabetes Mother    Lung cancer Father     Prior to Admission medications   Medication Sig Start Date End Date Taking? Authorizing Provider  acetaminophen (TYLENOL) 325 MG tablet Take 650 mg by mouth every 4 (four) hours as needed for mild pain or moderate pain.    [provider]  albuterol (VENTOLIN HFA) 108 (90 Base) MCG/ACT inhaler Inhale 2 puffs into the lungs every 6 (six) hours as needed for wheezing or shortness of breath. 04/22/22   Sidney Ace, MD  alum & mag hydroxide-simeth (MAALOX PLUS) 400-400-40 MG/5ML suspension Take 5 mLs by mouth every 4 (four) hours as needed for indigestion.    [provider]  apixaban (ELIQUIS) 5 MG TABS tablet Take 1 tablet (5 mg total) by mouth 2 (two) times daily. 04/18/22   Mercy Riding, MD  benzonatate (TESSALON PERLES) 100 MG capsule Take 1 capsule (100 mg total) by mouth 3 (three) times daily as needed for cough. 04/22/22 04/22/23  Sidney Ace, MD  bismuth subsalicylate (PEPTO BISMOL) 262 MG/15ML suspension Take 30 mLs by mouth as needed.    [provider]  carbamide peroxide (DEBROX) 6.5 % OTIC solution Place 5 drops into both ears as needed.    [provider]  cephALEXin (KEFLEX) 500 MG capsule Take 500 mg by mouth 3 (three) times daily. 05/02/22   [provider]  cetirizine (ZYRTEC) 10 MG chewable tablet Chew 10 mg by mouth daily as needed for  allergies.    [provider]  Cholecalciferol (VITAMIN D) 50 MCG (2000 UT) CAPS Give one capsule by mouth once daily .    [provider]  Clobetasol Propionate 0.05 % shampoo Apply 1 Application topically every morning. And wash out    [provider]  dextromethorphan-guaiFENesin (ROBITUSSIN-DM) 10-100 MG/5ML liquid Take 10 mLs by mouth every 4 (four) hours as needed for cough.    [provider]  dextrose (GLUTOSE) 40 % GEL Take 1 Tube by mouth as needed for low blood sugar.    [provider]  fluocinolone (SYNALAR) 0.01 % external solution Apply 1 Application topically at bedtime. Apply to scalp    [provider]  furosemide (LASIX) 20 MG tablet TAKE 1 TABLET BY MOUTH DAILY ONLY IF NEEDED. 03/20/21   Bacigalupo, Dionne Bucy, MD  glipiZIDE (GLUCOTROL XL) 10 MG 24 hr tablet Take 10 mg by  mouth 2 (two) times daily.    [provider]  ipratropium-albuterol (DUONEB) 0.5-2.5 (3) MG/3ML SOLN Take 3 mLs by nebulization every 4 (four) hours as needed. 04/22/22   Sidney Ace, MD  linagliptin (TRADJENTA) 5 MG TABS tablet Take 1 tablet (5 mg total) by mouth daily. 09/17/21   Venia Carbon, MD  lisinopril (ZESTRIL) 20 MG tablet TAKE 1 TABLET BY MOUTH DAILY 10/14/21   End, Harrell Gave, MD  magnesium hydroxide (MILK OF MAGNESIA) 400 MG/5ML suspension Take 5 mLs by mouth daily as needed for mild constipation.    [provider]  metFORMIN (GLUCOPHAGE-XR) 500 MG 24 hr tablet Take 1 tablet (500 mg total) by mouth 2 (two) times daily. 09/05/21   Jearld Fenton, NP  metoprolol succinate (TOPROL-XL) 25 MG 24 hr tablet TAKE 1 TABLET BY MOUTH ONCE DAILY 04/17/22   End, Harrell Gave, MD  Multiple Vitamin (MULTIVITAMIN WITH MINERALS) TABS tablet Take 1 tablet by mouth daily.    [provider]  nitrofurantoin, macrocrystal-monohydrate, (MACROBID) 100 MG capsule Take 100 mg by mouth 2 (two) times daily. 05/07/22   [provider]   nystatin (MYCOSTATIN/NYSTOP) powder Apply to excoriated area topically twice daily as needed for skin breakdown    [provider]  ondansetron (ZOFRAN) 4 MG tablet Take 4 mg by mouth every 6 (six) hours as needed for nausea or vomiting. Take one tablet by mouth three times daily as needed.    [provider]  ondansetron (ZOFRAN-ODT) 4 MG disintegrating tablet Take 1 tablet (4 mg total) by mouth every 8 (eight) hours as needed for nausea or vomiting. 05/08/22   Arta Silence, MD  OXYGEN 2lpm for dyspnea or SOB    [provider]  phenazopyridine (PYRIDIUM) 95 MG tablet Take 95 mg by mouth 3 (three) times daily as needed for pain.    [provider]  polyethylene glycol (MIRALAX / GLYCOLAX) 17 g packet Take 17 g by mouth daily as needed.    [provider]    Physical Exam: Vitals:   05/09/22 1442 05/09/22 1500 05/09/22 1548 05/09/22 1548  BP: (!) 152/67 (!) 148/70  (!) 144/88  Pulse: 78 80 66   Resp: 16 16    Temp:  98 F (36.7 C) 98.2 F (36.8 C)   TempSrc:   Oral   SpO2: 96% 96% 97%   Weight:    77.7 kg  Height:    5' (1.524 m)   Physical Exam Vitals and nursing note reviewed.  Constitutional:      General: She is not in acute distress.    Appearance: She is obese.  HENT:     Head: Normocephalic and atraumatic.     Comments: No scalp hematomas, lacerations or abrasions noted.    Mouth/Throat:     Mouth: Mucous membranes are moist.     Pharynx: Oropharynx is clear.  Eyes:     Conjunctiva/sclera: Conjunctivae normal.     Pupils: Pupils are equal, round, and reactive to light.  Cardiovascular:     Rate and Rhythm: Normal rate and regular rhythm.     Heart sounds: No murmur heard.    No gallop.  Pulmonary:     Effort: Pulmonary effort is normal. No respiratory distress.     Breath sounds: Normal breath sounds. No wheezing, rhonchi or rales.  Abdominal:     General: Bowel sounds are normal. There is no distension.      Palpations: Abdomen is soft.  Tenderness: There is abdominal tenderness (mild-moderate) in the right upper quadrant. There is no guarding.  Musculoskeletal:     Right lower leg: No edema.     Left lower leg: No edema.  Skin:    General: Skin is warm and dry.     Findings: Bruising (left arm) present.  Neurological:     Mental Status: She is alert and oriented to person, place, and time. Mental status is at baseline.     Motor: No weakness.  Psychiatric:        Mood and Affect: Mood normal.        Behavior: Behavior normal.    Data Reviewed: CBC with WBC of 11.8, hemoglobin of 12.5, platelets of 183 CMP with sodium of 138, potassium 4.4, chloride 101, bicarb 24, glucose 411, BUN 27, creatinine 1.08, AST 331, ALT 310, total bilirubin 1.8 and GFR 49. Initial troponin elevated at 964.  Previous troponin 1 day prior was 22.  No EKG in Epic.   DG Chest 1 View  Result Date: 05/09/2022 CLINICAL DATA:  weakness / fall this am EXAM: CHEST  1 VIEW COMPARISON:  Chest x-ray very 11/28/2022. FINDINGS: Lung volumes. Mild streaky bibasilar opacities. No confluent consolidation. No visible pneumothorax or pleural effusions. Vascular crowding due to low lung volumes. Cardiomediastinal silhouette is within normal limits for technique. IMPRESSION: Mild streaky bibasilar opacities likely represents atelectasis given low lung volumes. No confluent consolidation. Electronically Signed   By: Margaretha Sheffield M.D.   On: 05/09/2022 12:57   CT Head Wo Contrast  Result Date: 05/09/2022 CLINICAL DATA:  Fall EXAM: CT HEAD WITHOUT CONTRAST CT CERVICAL SPINE WITHOUT CONTRAST TECHNIQUE: Multidetector CT imaging of the head and cervical spine was performed following the standard protocol without intravenous contrast. Multiplanar CT image reconstructions of the cervical spine were also generated. RADIATION DOSE REDUCTION: This exam was performed according to the departmental dose-optimization program which includes  automated exposure control, adjustment of the mA and/or kV according to patient size and/or use of iterative reconstruction technique. COMPARISON:  CT Head 12/28/17 FINDINGS: CT HEAD FINDINGS Brain: No evidence of acute infarction, hemorrhage, hydrocephalus, extra-axial collection or mass lesion/mass effect. Sequela of moderate to severe chronic microvascular ischemic change. Vascular: No hyperdense vessel or unexpected calcification. Skull: Mild soft tissue swelling over the left frontal scalp Sinuses/Orbits: No middle ear or mastoid effusion. Paranasal sinuses are clear. Right lens replacement. Orbits are otherwise unremarkable. Other: None. CT CERVICAL SPINE FINDINGS Alignment: Normal. Skull base and vertebrae: No acute fracture. No primary bone lesion or focal pathologic process. Postsurgical changes from prior right hemilaminectomy at C6. Soft tissues and spinal canal: No prevertebral fluid or swelling. No visible canal hematoma. Disc levels:  No evidence of high-grade spinal canal stenosis. Upper chest: Negative. Other: None IMPRESSION: 1. No CT evidence of intracranial injury. 2. Mild soft tissue swelling over the left frontal scalp. 3. No acute fracture or traumatic malalignment of the cervical spine. Electronically Signed   By: Marin Roberts M.D.   On: 05/09/2022 12:56   CT Cervical Spine Wo Contrast  Result Date: 05/09/2022 CLINICAL DATA:  Fall EXAM: CT HEAD WITHOUT CONTRAST CT CERVICAL SPINE WITHOUT CONTRAST TECHNIQUE: Multidetector CT imaging of the head and cervical spine was performed following the standard protocol without intravenous contrast. Multiplanar CT image reconstructions of the cervical spine were also generated. RADIATION DOSE REDUCTION: This exam was performed according to the departmental dose-optimization program which includes automated exposure control, adjustment of the mA and/or kV according to patient  size and/or use of iterative reconstruction technique. COMPARISON:  CT Head  12/28/17 FINDINGS: CT HEAD FINDINGS Brain: No evidence of acute infarction, hemorrhage, hydrocephalus, extra-axial collection or mass lesion/mass effect. Sequela of moderate to severe chronic microvascular ischemic change. Vascular: No hyperdense vessel or unexpected calcification. Skull: Mild soft tissue swelling over the left frontal scalp Sinuses/Orbits: No middle ear or mastoid effusion. Paranasal sinuses are clear. Right lens replacement. Orbits are otherwise unremarkable. Other: None. CT CERVICAL SPINE FINDINGS Alignment: Normal. Skull base and vertebrae: No acute fracture. No primary bone lesion or focal pathologic process. Postsurgical changes from prior right hemilaminectomy at C6. Soft tissues and spinal canal: No prevertebral fluid or swelling. No visible canal hematoma. Disc levels:  No evidence of high-grade spinal canal stenosis. Upper chest: Negative. Other: None IMPRESSION: 1. No CT evidence of intracranial injury. 2. Mild soft tissue swelling over the left frontal scalp. 3. No acute fracture or traumatic malalignment of the cervical spine. Electronically Signed   By: Marin Roberts M.D.   On: 05/09/2022 12:56    Results are pending, will review when available.  Assessment and Plan:  * NSTEMI (non-ST elevated myocardial infarction) [facility]) Patient presenting after rolling off bed.  Her troponin on presentation elevated as high as 960, with downtrend.  No EKG changes and patient is chest pain-free (although patient is a poor historian).  However she has multiple risk factors including uncontrolled type 2 diabetes, uncontrolled hyperlipidemia, and hypertension.  Due to this, will initiate heparin infusion and discussed with cardiology.  - Cardiology consulted; appreciate their recommendations - Continue heparin infusion per pharmacy dosing - S/p aspirin 324 mg once - Start aspirin 81 mg daily tomorrow - Hold off on initiating statin given elevated LFTs - Echocardiogram ordered  Acute on  chronic pancreatitis [facility]) Patient has a history of acute pancreatitis in May 2023 that was suspected to be secondary to [place].  LFTs are markedly elevated today compared to 1 day prior with AST 331 and ALT 310.  In addition, lipase has trended up to 562 from 390 the day prior.  Given changes in LFTs and lipase and right upper quadrant pain on exam, most consistent with acute on chronic pancreatitis.  Interestingly, patient was recently restarted on Macrobid 2 weeks ago.  - Stop Macrobid; if no other etiology of pancreatitis can be identified, will need to add Macrobid to patient's allergy list - Right upper quadrant ultrasound pending - IV fluid resuscitation  Ground-level fall Per patient, she rolled off the bed when she was asleep, however she cannot recall the event.  No injuries noted on exam and CT head without any acute intracranial abnormalities.  -PT/OT  Type 2 diabetes mellitus without complications ([place]) History of uncontrolled diabetes with CBG as high as 411 on admission.  Last A1c approximately 3 weeks ago at 8.3%  - Hold home antiglycemic agents - Start SSI, moderate - Start Semglee 10 units at bedtime  Paroxysmal atrial fibrillation ([facility]) - Continue home metoprolol - Hold home Eliquis given heparin infusion  Essential hypertension - Hold lisinopril at this time given slight increase in creatinine  AKI (acute kidney injury) ([place]) Likely prerenal in the setting of NSTEMI and acute pancreatitis  - Repeat BMP in the a.m. - IV fluids as ordered - Hold home nephrotoxic agents - Bladder scan  COPD (chronic obstructive pulmonary disease) ([place]) - Continue home supplemental oxygen as needed - Continue home bronchodilators  Advance Care Planning:   Code Status: DNR/DNI. Verified by patient's daughter. [place] Rm  Form available in chart.   Consults: Cardiology  Family Communication: Patients daughter updated at bedside.   Severity of Illness: The appropriate patient  status for this patient is INPATIENT. Inpatient status is judged to be reasonable and necessary in order to provide the required intensity of service to ensure the patient's safety. The patient's presenting symptoms, physical exam findings, and initial radiographic and laboratory data in the context of their chronic comorbidities is felt to place them at high risk for further clinical deterioration. Furthermore, it is not anticipated that the patient will be medically stable for discharge from the hospital within 2 midnights of admission.   * I certify that at the point of admission it is my clinical judgment that the patient will require inpatient hospital care spanning beyond 2 midnights from the point of admission due to high intensity of service, high risk for further deterioration and high frequency of surveillance required.*  Author: Jose Persia, MD 05/09/2022 5:23 PM  For on call review [email or web address].

## 2022-05-09 NOTE — Inpatient Diabetes Management (Signed)
Inpatient Diabetes Program Recommendations  AACE/ADA: New Consensus Statement on Inpatient Glycemic Control (2015)  Target Ranges:  Prepandial:   less than 140 mg/dL      Peak postprandial:   less than 180 mg/dL (1-2 hours)      Critically ill patients:  140 - 180 mg/dL   Lab Results  Component Value Date   GLUCAP 197 (H) 04/22/2022   HGBA1C 8.3 (H) 04/17/2022    Review of Glycemic Control  Latest Reference Range & Units 05/09/22 12:40  Glucose 70 - 99 mg/dL 411 (H)  (H): Data is abnormally high  Diabetes history: DM2 Outpatient Diabetes medications:  Metformin 500 mg BID Tradjenta 5 mg QD Glipizide 10 mg BID Current orders for Inpatient glycemic control: None  Inpatient Diabetes Program Recommendations:    Novolog 0-9 units Q4H  Will continue to follow while inpatient.  Thank you, Reche Dixon, MSN, [place] Diabetes Coordinator Inpatient Diabetes Program [phone] (team pager from 8a-5p)

## 2022-05-09 NOTE — Assessment & Plan Note (Addendum)
-   Hold lisinopril at this time given slight increase in creatinine

## 2022-05-09 NOTE — Progress Notes (Addendum)
ANTICOAGULATION CONSULT NOTE - Initial Consult  Pharmacy Consult for Heparin infusion dosing and monitoring  Indication: chest pain/ACS  Allergies  Allergen Reactions   Morphine Sulfate Other (See Comments)    BP bottoms out   Penicillins Diarrhea    Has patient had a PCN reaction causing immediate rash, facial/tongue/throat swelling, SOB or lightheadedness with hypotension: Yes Has patient had a PCN reaction causing severe rash involving mucus membranes or skin necrosis: Yes Has patient had a PCN reaction that required hospitalization No Has patient had a PCN reaction occurring within the last 10 years: No If all of the above answers are "NO", then may proceed with Cephalosporin use.    Patient Measurements: Height: 5' (152.4 cm) Weight: 77.7 kg (171 lb 4.8 oz) IBW/kg (Calculated) : 45.5 Heparin Dosing Weight: 63.1  Vital Signs: Temp: 98.2 F (36.8 C) (03/01 1548) Temp Source: Oral (03/01 1548) BP: 144/88 (03/01 1548) Pulse Rate: 66 (03/01 1548)  Labs: Recent Labs    05/08/22 0634 05/08/22 0852 05/08/22 1055 05/09/22 1240 05/09/22 1440  HGB 13.0  --   --  12.5  --   HCT 39.2  --   --  38.6  --   PLT 193  --   --  183  --   LABPROT  --   --   --  19.5*  --   INR  --   --   --  1.7*  --   CREATININE 0.94  --   --  1.08*  --   TROPONINIHS 12   < > 22* 964* 846*   < > = values in this interval not displayed.    Estimated Creatinine Clearance: 33.2 mL/min (A) (by C-G formula based on SCr of 1.08 mg/dL (H)).   Medical History: Past Medical History:  Diagnosis Date   Acute pancreatitis 07/24/2021   Basal cell carcinoma 03/30/2008   left upper arm/excision   Basal cell carcinoma 03/31/2008   left upper arm, right lower leg   Basal cell carcinoma 01/18/2009   left upper arm   Collagenous colitis    Diabetes mellitus without complication ([place])    Diverticulitis    Elevated LFTs 07/25/2021   Heart murmur    at birth, none since   History of echocardiogram     a. 11/2015: echo showing EF of 60-65% with no WMA. Grade 1 DD and mild MR noted.    Hyperlipidemia    Per [facility]   Hypertension    Intertrochanteric fracture of right femur ([place]) 06/11/2019   Lack of coordination    Per [facility] term current use of anticoagulant    Per [facility]   Noninfective gastroenteritis and colitis, unspecified    Per [facility]   PAF (paroxysmal atrial fibrillation) ([place])    a. initially diagnosed in 08/2016 --> started on Eliquis   Squamous cell carcinoma of skin 05/16/2020   Left nasal tip anterior to ala, refer to [facility]   Syncope    a. initially occurring in Fall 2016 b. Hospitalized in 10/2015 and 11/2015 for recurrent episodes.     Medications:  Apixaban PTA  Assessment: Pharmacy consulted for heparin infusion dosing and monitoring for 89 yo female for NSTEMI/elevated troponin. Patient was brought to ED after a fall at facility. She has PMH A. Fib and was taking apixaban prior to admission. Last dose of apixaban reported 2/29 @ 2000.   Troponin: 22>964>>846  Goal of Therapy:  Heparin level 0.3-0.7 units/ml aPTT  66-102 seconds Monitor platelets by anticoagulation protocol: Yes   Plan:  Give 3000 units bolus x 1 Start heparin infusion at 750 units/hr Will need to adjust heparin infusion by aPTT level until aPTT and anti-Xa (heparin levels) correlate.  Check aPTT level in 8 hours and daily while on heparin Continue to monitor H&H and platelets  Pernell Dupre, PharmD, BCPS Clinical Pharmacist 05/09/2022 4:17 PM

## 2022-05-09 NOTE — ED Notes (Signed)
Patient was incontinent to urine and stool upon arrival to the room. Patient stood at bedside while being cleaned and was able to sit on the stretcher independently. Patient was assisted to position of comfort on the stretcher. Soiled pants were placed in a bag and is at bedside. Patient denies any pain or injuries. Patient states she rolled over and fell out of bed.

## 2022-05-09 NOTE — ED Provider Notes (Signed)
[facility] Provider Note    Event Date/Time   First MD Initiated Contact with Patient 05/09/22 [id number]     (approximate)   History   Fall   HPI  [name] is a 89 y.o. female   is brought to the ED via EMS from [facility] when patient rolled out of bed this morning.  Patient denies any complaints at this time.  Patient was seen in the emergency department on 05/08/2022 with a diagnosis of nausea,vomiting and diarrhea.  Patient improved with IV fluids and was sent back to [facility].  Patient was brought to the ED mainly because of rolling out of bed and the fact that she is on blood thinners.  Denies history of hypertension, diabetes, recent RSV infection, COPD exacerbation and long-term use of anticoagulants secondary to atrial fibs, hyperlipidemia and history of abnormal echocardiogram.      Physical Exam   Triage Vital Signs: ED Triage Vitals  Enc Vitals Group     BP 05/09/22 0858 132/67     Pulse Rate 05/09/22 0857 86     Resp 05/09/22 0857 16     Temp 05/09/22 0857 98.8 F (37.1 C)     Temp Source 05/09/22 0857 Oral     SpO2 05/09/22 0857 95 %     Weight 05/09/22 0857 196 lb 3.4 oz (89 kg)     Height 05/09/22 0857 5' (1.524 m)     Head Circumference --      Peak Flow --      Pain Score 05/09/22 0857 0     Pain Loc --      Pain Edu? --      Excl. in [place]? --     Most recent vital signs: Vitals:   05/09/22 1442 05/09/22 1500  BP: (!) 152/67 (!) 148/70  Pulse: 78 80  Resp: 16 16  Temp:  98 F (36.7 C)  SpO2: 96% 96%     General: Awake, no distress.  Answer simple questions appropriately.  Patient is hard of hearing. CV:  Good peripheral perfusion. Regular rate and rhythm. Resp:  Normal effort.  Lungs are clear bilaterally. Abd:  No distention.  Other:  Soft tissue edema minimal without discoloration to the right of the forehead.  Skin is intact.  No tenderness noted on palpation of cervical spine posteriorly.  Patient is able  move upper extremities without assistance.  Nontender bilateral ribs on palpation.  No discoloration present.  Abdomen soft, nontender, bowel sounds are present x 4 quadrants.  No tenderness is noted on palpation of the lower extremities and no skin tears or discoloration present.  No rotation or shortening of the lower extremities noted.  Nontender pelvis to compression.   ED Results / Procedures / Treatments   Labs (all labs ordered are listed, but only abnormal results are displayed) Labs Reviewed  CBC WITH DIFFERENTIAL/PLATELET - Abnormal; Notable for the following components:      Result Value   WBC 11.8 (*)    Neutro Abs 9.5 (*)    Abs Immature Granulocytes 0.09 (*)    All other components within normal limits  COMPREHENSIVE METABOLIC PANEL - Abnormal; Notable for the following components:   Glucose, Bld 411 (*)    BUN 27 (*)    Creatinine, Ser 1.08 (*)    AST 331 (*)    ALT 310 (*)    Total Bilirubin 1.8 (*)    GFR, Estimated 49 (*)  All other components within normal limits  TROPONIN I (HIGH SENSITIVITY) - Abnormal; Notable for the following components:   Troponin I (High Sensitivity) 964 (*)    All other components within normal limits  URINALYSIS, ROUTINE W REFLEX MICROSCOPIC  PROTIME-INR  LIPASE, BLOOD  TROPONIN I (HIGH SENSITIVITY)     EKG  Normal sinus rhythm Ventricular rate 71 PR Interval 140 QRS duration 94 QT/QTc  394/428 P-R-T  66  53  RADIOLOGY  Chest x-ray images were reviewed no obvious rib fracture noted.  Radiology report for mild streaky bibasilar opacities likely representing atelectasis.  No confluent consolidation.  CT head and cervical spine per radiologist is negative for acute intracranial or cervical fracture.  Radiology does comment on the soft tissue edema to the forehead.    PROCEDURES:  Critical Care performed:   Procedures   MEDICATIONS ORDERED IN ED: Medications  0.9 %  sodium chloride infusion (has no administration in  time range)     IMPRESSION / MDM / ASSESSMENT AND PLAN / ED COURSE  I reviewed the triage vital signs and the nursing notes.   Differential diagnosis includes, but is not limited to, head injury, cervical fracture, rib fracture secondary to fall from bed this morning.  Dehydration secondary to recent nausea, vomiting and diarrhea.  89 year old female is brought to the ED via EMS after staff states that she fell from her bed this morning.  Family member present states that patient is at her baseline when answering questions.  She was recently seen in the emergency department yesterday for nausea, vomiting and diarrhea and improved prior to discharge yesterday with IV fluids.  Patient denies any other symptoms however in working patient up it was noted that yesterday her troponin was 22 and today is 964.  Glucose elevated at 411, BUN/creatinine 27 and 1.08 respectively.  Liver functions are also elevated.  Family is present with patient and lab findings were discussed.  Arrangements are being made for patient to be hospitalized for further evaluation.      Patient's presentation is most consistent with acute presentation with potential threat to life or bodily function.  FINAL CLINICAL IMPRESSION(S) / ED DIAGNOSES   Final diagnoses:  Elevated troponin  Abnormal laboratory test result     Rx / DC Orders   ED Discharge Orders     None        Note:  This document was prepared using Dragon voice recognition software and may include unintentional dictation errors.   Johnn Hai, PA-C 05/09/22 1515    Merlyn Lot, MD 05/09/22 [phone]

## 2022-05-09 NOTE — Plan of Care (Signed)

## 2022-05-09 NOTE — ED Notes (Signed)
Daughter in pt  States she was informed by [facility] that she was found on the  floor this am   States she rolled out of bed  Not sure how long she was on the floor

## 2022-05-09 NOTE — Plan of Care (Signed)
  Problem: Education: Goal: Knowledge of General Education information will improve Description: Including pain rating scale, medication(s)/side effects and non-pharmacologic comfort measures Outcome: Progressing   Problem: Health Behavior/Discharge Planning: Goal: Ability to manage health-related needs will improve Outcome: Progressing   Problem: Clinical Measurements: Goal: Ability to maintain clinical measurements within normal limits will improve Outcome: Progressing Goal: Will remain free from infection Outcome: Progressing Goal: Diagnostic test results will improve Outcome: Progressing Goal: Respiratory complications will improve Outcome: Progressing Goal: Cardiovascular complication will be avoided Outcome: Progressing   Problem: Activity: Goal: Risk for activity intolerance will decrease Outcome: Progressing   Problem: Nutrition: Goal: Adequate nutrition will be maintained Outcome: Progressing   Problem: Coping: Goal: Level of anxiety will decrease Outcome: Progressing   Problem: Elimination: Goal: Will not experience complications related to bowel motility Outcome: Progressing Goal: Will not experience complications related to urinary retention Outcome: Progressing   Problem: Pain Managment: Goal: General experience of comfort will improve Outcome: Progressing   Problem: Safety: Goal: Ability to remain free from injury will improve Outcome: Progressing   Problem: Skin Integrity: Goal: Risk for impaired skin integrity will decrease Outcome: Progressing   Problem: Education: Goal: Understanding of cardiac disease, CV risk reduction, and recovery process will improve Outcome: Progressing Goal: Individualized Educational Video(s) Outcome: Progressing   Problem: Activity: Goal: Ability to tolerate increased activity will improve Outcome: Progressing   Problem: Cardiac: Goal: Ability to achieve and maintain adequate cardiovascular perfusion will  improve Outcome: Progressing   Problem: Health Behavior/Discharge Planning: Goal: Ability to safely manage health-related needs after discharge will improve Outcome: Progressing   Problem: Education: Goal: Ability to describe self-care measures that may prevent or decrease complications (Diabetes Survival Skills Education) will improve Outcome: Progressing Goal: Individualized Educational Video(s) Outcome: Progressing   Problem: Coping: Goal: Ability to adjust to condition or change in health will improve Outcome: Progressing   Problem: Fluid Volume: Goal: Ability to maintain a balanced intake and output will improve Outcome: Progressing   Problem: Health Behavior/Discharge Planning: Goal: Ability to identify and utilize available resources and services will improve Outcome: Progressing Goal: Ability to manage health-related needs will improve Outcome: Progressing   Problem: Metabolic: Goal: Ability to maintain appropriate glucose levels will improve Outcome: Progressing   Problem: Nutritional: Goal: Maintenance of adequate nutrition will improve Outcome: Progressing Goal: Progress toward achieving an optimal weight will improve Outcome: Progressing   Problem: Skin Integrity: Goal: Risk for impaired skin integrity will decrease Outcome: Progressing   Problem: Tissue Perfusion: Goal: Adequacy of tissue perfusion will improve Outcome: Progressing   

## 2022-05-09 NOTE — ED Triage Notes (Signed)
First Nurse Notes:  Pt via [facility] from [facility], pt rolled out of bed this morning. Pt denies complaints at this time. Pt was recently dx with RSV. Pt is A&Ox4 and NAD  143/70 BP, 85 HR, 93% on RA

## 2022-05-09 NOTE — Assessment & Plan Note (Signed)
-   Continue home supplemental oxygen as needed - Continue home bronchodilators

## 2022-05-09 NOTE — Consult Note (Signed)
Cardiology Consultation   Patient ID: [name] MRN: [id number]; DOB: 03/12/1931  Admit date: 05/09/2022 Date of Consult: 05/09/2022  PCP:  [name], [name] Providers Cardiologist:  Nelva Bush, MD    Patient Profile:   [name] is a 89 y.o. female with a hx of paroxysmal atrial fibrillation, hypertension, hyperlipidemia, type 2 diabetes mellitus, and recurrent syncope thought to be vasovagal in nature, who is being seen 05/09/2022 for the evaluation of elevated troponin at the request of Dr. Charleen Kirks.  History of Present Illness:   [name] was admitted through the ED after having been found on the ground next to her bed this morning.  Her problems began yesterday, when she suddenly became dizzy and fell while sitting on the toilet.  She had initially developed some nausea and abdominal pain, which prompted her to activate her help necklace.  When the caregiver arrived, she found [name] seated on the toilet.  At that point, [name] suddenly became weak and may have almost passed out.  She was brought to the ED for further evaluation.  Notably, she had been hospitalized in early to mid February with RSV infection and COPD exacerbation.  In the ED yesterday, no obvious abnormalities were identified.  She was therefore sent back to [facility].  EMS was summoned after she was found on the ground this morning.  [name] does not recall how she got on the ground and how long she may have been there.  There were no obvious injuries when her caregivers arrived to administer her morning medications.  [name] notes some chronic shortness of breath, which is not been any worse since her hospitalizations last month with respiratory issues.  She also denies chest pain, palpitations, and edema.  As part of her workup in the ED today, high-sensitivity troponin I was checked.  It was negligible yesterday at 20 and 22 but notably elevated at 964 and 846  today.  Other lab abnormalities include severely elevated glucose of 411 as well as magnesium of 1.5, lipase 562, AST 331, ALT 310, and total bilirubin 1.8.  WBC mildly elevated at 11.8.  Hgb 12.5, HCT 38.6, and PLT 183.    Past Medical History:  Diagnosis Date   Acute pancreatitis 07/24/2021   Basal cell carcinoma 03/30/2008   left upper arm/excision   Basal cell carcinoma 03/31/2008   left upper arm, right lower leg   Basal cell carcinoma 01/18/2009   left upper arm   Collagenous colitis    Diabetes mellitus without complication ([place])    Diverticulitis    Elevated LFTs 07/25/2021   Heart murmur    at birth, none since   History of echocardiogram    a. 11/2015: echo showing EF of 60-65% with no WMA. Grade 1 DD and mild MR noted.    Hyperlipidemia    Per [facility]   Hypertension    Intertrochanteric fracture of right femur ([place]) 06/11/2019   Lack of coordination    Per [facility] term current use of anticoagulant    Per [facility]   Noninfective gastroenteritis and colitis, unspecified    Per [facility]   PAF (paroxysmal atrial fibrillation) ([place])    a. initially diagnosed in 08/2016 --> started on Eliquis   Squamous cell carcinoma of skin 05/16/2020   Left nasal tip anterior to ala, refer to [facility]   Syncope    a. initially occurring in Fall  2016 b. Hospitalized in 10/2015 and 11/2015 for recurrent episodes.     Past Surgical History:  Procedure Laterality Date   ABDOMINAL HYSTERECTOMY     BACK SURGERY  08/08/2008   Lumbar discectomy   COLONOSCOPY WITH PROPOFOL     EXCISION/RELEASE BURSA HIP Left 05/03/2019   Procedure: HIP ABDUCTOR REPAIR;  Surgeon: Hessie Knows, MD;  Location: [facility] ORS;  Service: Orthopedics;  Laterality: Left;   INTRAMEDULLARY (IM) NAIL INTERTROCHANTERIC Right 06/13/2019   Procedure: INTRAMEDULLARY (IM) NAIL INTERTROCHANTRIC;  Surgeon: Hessie Knows, MD;  Location: [facility] ORS;  Service: Orthopedics;  Laterality: Right;   NECK SURGERY  1980      Home Medications:  Prior to Admission medications   Medication Sig Start Date Ashkan Chamberland Date Taking? Authorizing Provider  acetaminophen (TYLENOL) 325 MG tablet Take 650 mg by mouth every 4 (four) hours as needed for mild pain or moderate pain.   Yes [provider]  albuterol (VENTOLIN HFA) 108 (90 Base) MCG/ACT inhaler Inhale 2 puffs into the lungs every 6 (six) hours as needed for wheezing or shortness of breath. 04/22/22  Yes Sreenath, Sudheer B, MD  alum & mag hydroxide-simeth (MAALOX PLUS) 400-400-40 MG/5ML suspension Take 5 mLs by mouth every 4 (four) hours as needed for indigestion.   Yes [provider]  apixaban (ELIQUIS) 5 MG TABS tablet Take 1 tablet (5 mg total) by mouth 2 (two) times daily. 04/18/22  Yes Mercy Riding, MD  benzonatate (TESSALON PERLES) 100 MG capsule Take 1 capsule (100 mg total) by mouth 3 (three) times daily as needed for cough. 04/22/22 04/22/23 Yes Sreenath, Sudheer B, MD  bismuth subsalicylate (PEPTO BISMOL) 262 MG/15ML suspension Take 30 mLs by mouth as needed.   Yes [provider]  carbamide peroxide (DEBROX) 6.5 % OTIC solution Place 5 drops into both ears as needed.   Yes [provider]  cephALEXin (KEFLEX) 500 MG capsule Take 500 mg by mouth 3 (three) times daily. 05/02/22  Yes [provider]  cetirizine (ZYRTEC) 10 MG chewable tablet Chew 10 mg by mouth daily as needed for allergies.   Yes [provider]  Cholecalciferol (VITAMIN D) 50 MCG (2000 UT) CAPS Give one capsule by mouth once daily .   Yes [provider]  Clobetasol Propionate 0.05 % shampoo Apply 1 Application topically every morning. And wash out   Yes [provider]  dextromethorphan-guaiFENesin (ROBITUSSIN-DM) 10-100 MG/5ML liquid Take 10 mLs by mouth every 4 (four) hours as needed for cough.   Yes [provider]  dextrose (GLUTOSE) 40 % GEL Take 1 Tube by mouth as needed for low blood sugar.   Yes [provider]  fluocinolone (SYNALAR) 0.01 % external solution Apply 1 Application topically at bedtime. Apply to scalp   Yes [provider]  furosemide (LASIX) 20 MG tablet TAKE 1 TABLET BY MOUTH DAILY ONLY IF NEEDED. 03/20/21  Yes Bacigalupo, Dionne Bucy, MD  glipiZIDE (GLUCOTROL XL) 10 MG 24 hr tablet Take 10 mg by mouth 2 (two) times daily.   Yes [provider]  ipratropium-albuterol (DUONEB) 0.5-2.5 (3) MG/3ML SOLN Take 3 mLs by nebulization every 4 (four) hours as needed. 04/22/22  Yes Sreenath, Sudheer B, MD  linagliptin (TRADJENTA) 5 MG TABS tablet Take 1 tablet (5 mg total) by mouth daily. 09/17/21  Yes Viviana Simpler I, MD  lisinopril (ZESTRIL) 20 MG tablet TAKE 1 TABLET BY MOUTH DAILY 10/14/21  Yes Carmelina Balducci, Harrell Gave, MD  magnesium hydroxide (MILK OF MAGNESIA) 400  MG/5ML suspension Take 5 mLs by mouth daily as needed for mild constipation.   Yes [provider]  metFORMIN (GLUCOPHAGE-XR) 500 MG 24 hr tablet Take 1 tablet (500 mg total) by mouth 2 (two) times daily. 09/05/21  Yes Baity, Coralie Keens, NP  metoprolol succinate (TOPROL-XL) 25 MG 24 hr tablet TAKE 1 TABLET BY MOUTH ONCE DAILY 04/17/22  Yes Ryleigh Buenger, Harrell Gave, MD  Multiple Vitamin (MULTIVITAMIN WITH MINERALS) TABS tablet Take 1 tablet by mouth daily.   Yes [provider]  nitrofurantoin, macrocrystal-monohydrate, (MACROBID) 100 MG capsule Take 100 mg by mouth 2 (two) times daily. 05/07/22  Yes [provider]  nystatin (MYCOSTATIN/NYSTOP) powder Apply to excoriated area topically twice daily as needed for skin breakdown   Yes [provider]  ondansetron (ZOFRAN) 4 MG tablet Take 4 mg by mouth every 6 (six) hours as needed for nausea or vomiting. Take one tablet by mouth three times daily as needed.   Yes [provider]  ondansetron (ZOFRAN-ODT) 4 MG disintegrating tablet Take 1 tablet (4 mg total) by mouth every 8 (eight) hours as needed for nausea or vomiting. 05/08/22  Yes  Arta Silence, MD  phenazopyridine (PYRIDIUM) 95 MG tablet Take 95 mg by mouth 3 (three) times daily as needed for pain.   Yes [provider]  polyethylene glycol (MIRALAX / GLYCOLAX) 17 g packet Take 17 g by mouth daily as needed.   Yes [provider]  OXYGEN 2lpm for dyspnea or SOB    [provider]    Inpatient Medications: Scheduled Meds:  [START ON 05/10/2022] aspirin EC  81 mg Oral Daily   insulin aspart  0-15 Units Subcutaneous TID WC   insulin glargine-yfgn  10 Units Subcutaneous QHS   Continuous Infusions:  sodium chloride     PRN Meds: acetaminophen, nitroGLYCERIN, ondansetron (ZOFRAN) IV  Allergies:    Allergies  Allergen Reactions   Morphine Sulfate Other (See Comments)    BP bottoms out   Penicillins Diarrhea    Has patient had a PCN reaction causing immediate rash, facial/tongue/throat swelling, SOB or lightheadedness with hypotension: Yes Has patient had a PCN reaction causing severe rash involving mucus membranes or skin necrosis: Yes Has patient had a PCN reaction that required hospitalization No Has patient had a PCN reaction occurring within the last 10 years: No If all of the above answers are "NO", then may proceed with Cephalosporin use.    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  Tobacco Use   Smoking status: Former    Types: Cigarettes    Quit date: 03/09/1978    Years since quitting: 44.1   Smokeless tobacco: Never  Vaping Use   Vaping Use: Never used  Substance and Sexual Activity   Alcohol use: Not Currently    Comment: once a month--mixed drink   Drug use: No   Sexual activity: Not on file  Other Topics Concern   Not on file  Social History Narrative    2 daughters and a son (93 local plus [place] and [place])      Has living will    Daughter Jackelyn Poling is [place]   Has DNR   No feeding tube  Social Determinants of Health   Financial Resource Strain: Low Risk  (04/25/2020)   Overall Financial Resource Strain (CARDIA)    Difficulty of Paying Living Expenses: Not hard at all  Food Insecurity: No Food Insecurity (04/25/2020)   Hunger Vital Sign    Worried About Running Out of Food in the Last Year: Never true    Ran Out of Food in the Last Year: Never true  Transportation Needs: No Transportation Needs (04/25/2020)   PRAPARE - Hydrologist (Medical): No    Lack of Transportation (Non-Medical): No  Physical Activity: Inactive (04/25/2020)   Exercise Vital Sign    Days of Exercise per Week: 0 days    Minutes of Exercise per Session: 0 min  Stress: No Stress Concern Present (04/25/2020)   [place]    Feeling of Stress : Not at all  Social Connections: Moderately Isolated (04/25/2020)   Social Connection and Isolation Panel [NHANES]    Frequency of Communication with Friends and Family: More than three times a week    Frequency of Social Gatherings with Friends and Family: More than three times a week    Attends Religious Services: More than 4 times per year    Active Member of [employer] or Organizations: No    Attends Archivist Meetings: Never    Marital Status: Widowed  Intimate Partner Violence: Not At Risk (04/25/2020)   Humiliation, Afraid, Rape, and Kick questionnaire    Fear of Current or Ex-Partner: No    Emotionally Abused: No    Physically Abused: No    Sexually Abused: No    Family History:   Family History  Problem Relation Age of Onset   Hypertension Mother    Diabetes Mother    Lung cancer Father      ROS:  Please see the history of present illness. All other ROS reviewed and negative.     Physical Exam/Data:    Vitals:   05/09/22 1442 05/09/22 1500 05/09/22 1548 05/09/22 1548  BP: (!) 152/67 (!) 148/70  (!) 144/88  Pulse: 78 80 66   Resp: 16 16    Temp:  98 F (36.7 C) 98.2 F (36.8 C)   TempSrc:   Oral   SpO2: 96% 96% 97%   Weight:    77.7 kg  Height:    5' (1.524 m)   No intake or output data in the 24 hours ending 05/09/22 1637    05/09/2022    3:48 PM 05/09/2022    8:57 AM 05/08/2022    6:30 AM  Last 3 Weights  Weight (lbs) 171 lb 4.8 oz 196 lb 3.4 oz 196 lb 3.2 oz  Weight (kg) 77.7 kg 89 kg 88.996 kg     Body mass index is 33.45 kg/m.  General:  Well nourished elderly woman lying comfortably in bed. Neck: no JVD Vascular: 2+ radial pulses bilaterally. Cardiac:  normal S1, S2; RRR; no murmurs, rubs, or gallops. Lungs:  clear to auscultation bilaterally, no wheezing, rhonchi or rales  Abd: soft, nontender, no hepatomegaly  Ext: no edema Musculoskeletal:  No deformities, BUE and BLE strength normal and equal Skin: warm and dry  Neuro:  CNs 2-12 intact, no focal abnormalities noted Psych:  Normal affect   EKG:  The EKG was personally reviewed and demonstrates: Normal sinus rhythm with PVC and nonspecific ST changes. Telemetry:  Telemetry was personally reviewed and demonstrates: Normal sinus rhythm with  PVCs.  Relevant CV Studies: TTE (12/18/2020):  1. Left ventricular ejection fraction, by estimation, is 55 to 60%. The  left ventricle has normal function. The left ventricle has no regional  wall motion abnormalities. Left ventricular diastolic parameters are  consistent with Grade I diastolic  dysfunction (impaired relaxation).   2. Right ventricular systolic function is normal. The right ventricular  size is normal.   3. The mitral valve is normal in structure. No evidence of mitral valve  regurgitation.   4. The aortic valve was not well visualized. Aortic valve regurgitation  is not visualized.   5. The inferior vena cava is normal in size with greater than 50%   respiratory variability, suggesting right atrial pressure of 3 mmHg.   Pharmacologic MPI (10/26/2019): Low risk study without ischemia or scar.  LVEF 61%.  Coronary artery calcification and aortic atherosclerosis noted.  Laboratory Data:  High Sensitivity Troponin:   Recent Labs  Lab 05/08/22 0634 05/08/22 0852 05/08/22 1055 05/09/22 1240 05/09/22 1440  TROPONINIHS 12 20* 22* 964* 846*     Chemistry Recent Labs  Lab 05/08/22 0634 05/09/22 1240  NA 140 138  K 4.1 4.4  CL 105 101  CO2 26 24  GLUCOSE 327* 411*  BUN 27* 27*  CREATININE 0.94 1.08*  CALCIUM 9.4 9.5  GFRNONAA 58* 49*  ANIONGAP 9 13    Recent Labs  Lab 05/08/22 0634 05/09/22 1240  PROT 6.7 7.5  ALBUMIN 3.5 3.5  AST 53* 331*  ALT 46* 310*  ALKPHOS 50 92  BILITOT 0.9 1.8*   Lipids  Recent Labs  Lab 05/09/22 1418  CHOL 202*  TRIG 133  HDL 50  LDLCALC 125*  CHOLHDL 4.0    Hematology Recent Labs  Lab 05/08/22 0634 05/09/22 1240  WBC 10.4 11.8*  RBC 4.37 4.23  HGB 13.0 12.5  HCT 39.2 38.6  MCV 89.7 91.3  MCH 29.7 29.6  MCHC 33.2 32.4  RDW 11.8 12.1  PLT 193 183   Thyroid No results for input(s): "TSH", "FREET4" in the last 168 hours.  BNPNo results for input(s): "BNP", "PROBNP" in the last 168 hours.  DDimer No results for input(s): "DDIMER" in the last 168 hours.   Radiology/Studies:  DG Chest 1 View  Result Date: 05/09/2022 CLINICAL DATA:  weakness / fall this am EXAM: CHEST  1 VIEW COMPARISON:  Chest x-ray very 11/28/2022. FINDINGS: Lung volumes. Mild streaky bibasilar opacities. No confluent consolidation. No visible pneumothorax or pleural effusions. Vascular crowding due to low lung volumes. Cardiomediastinal silhouette is within normal limits for technique. IMPRESSION: Mild streaky bibasilar opacities likely represents atelectasis given low lung volumes. No confluent consolidation. Electronically Signed   By: Margaretha Sheffield M.D.   On: 05/09/2022 12:57   CT Head Wo  Contrast  Result Date: 05/09/2022 CLINICAL DATA:  Fall EXAM: CT HEAD WITHOUT CONTRAST CT CERVICAL SPINE WITHOUT CONTRAST TECHNIQUE: Multidetector CT imaging of the head and cervical spine was performed following the standard protocol without intravenous contrast. Multiplanar CT image reconstructions of the cervical spine were also generated. RADIATION DOSE REDUCTION: This exam was performed according to the departmental dose-optimization program which includes automated exposure control, adjustment of the mA and/or kV according to patient size and/or use of iterative reconstruction technique. COMPARISON:  CT Head 12/28/17 FINDINGS: CT HEAD FINDINGS Brain: No evidence of acute infarction, hemorrhage, hydrocephalus, extra-axial collection or mass lesion/mass effect. Sequela of moderate to severe chronic microvascular ischemic change. Vascular: No hyperdense vessel or unexpected calcification. Skull: Mild  soft tissue swelling over the left frontal scalp Sinuses/Orbits: No middle ear or mastoid effusion. Paranasal sinuses are clear. Right lens replacement. Orbits are otherwise unremarkable. Other: None. CT CERVICAL SPINE FINDINGS Alignment: Normal. Skull base and vertebrae: No acute fracture. No primary bone lesion or focal pathologic process. Postsurgical changes from prior right hemilaminectomy at C6. Soft tissues and spinal canal: No prevertebral fluid or swelling. No visible canal hematoma. Disc levels:  No evidence of high-grade spinal canal stenosis. Upper chest: Negative. Other: None IMPRESSION: 1. No CT evidence of intracranial injury. 2. Mild soft tissue swelling over the left frontal scalp. 3. No acute fracture or traumatic malalignment of the cervical spine. Electronically Signed   By: Marin Roberts M.D.   On: 05/09/2022 12:56   CT Cervical Spine Wo Contrast  Result Date: 05/09/2022 CLINICAL DATA:  Fall EXAM: CT HEAD WITHOUT CONTRAST CT CERVICAL SPINE WITHOUT CONTRAST TECHNIQUE: Multidetector CT imaging  of the head and cervical spine was performed following the standard protocol without intravenous contrast. Multiplanar CT image reconstructions of the cervical spine were also generated. RADIATION DOSE REDUCTION: This exam was performed according to the departmental dose-optimization program which includes automated exposure control, adjustment of the mA and/or kV according to patient size and/or use of iterative reconstruction technique. COMPARISON:  CT Head 12/28/17 FINDINGS: CT HEAD FINDINGS Brain: No evidence of acute infarction, hemorrhage, hydrocephalus, extra-axial collection or mass lesion/mass effect. Sequela of moderate to severe chronic microvascular ischemic change. Vascular: No hyperdense vessel or unexpected calcification. Skull: Mild soft tissue swelling over the left frontal scalp Sinuses/Orbits: No middle ear or mastoid effusion. Paranasal sinuses are clear. Right lens replacement. Orbits are otherwise unremarkable. Other: None. CT CERVICAL SPINE FINDINGS Alignment: Normal. Skull base and vertebrae: No acute fracture. No primary bone lesion or focal pathologic process. Postsurgical changes from prior right hemilaminectomy at C6. Soft tissues and spinal canal: No prevertebral fluid or swelling. No visible canal hematoma. Disc levels:  No evidence of high-grade spinal canal stenosis. Upper chest: Negative. Other: None IMPRESSION: 1. No CT evidence of intracranial injury. 2. Mild soft tissue swelling over the left frontal scalp. 3. No acute fracture or traumatic malalignment of the cervical spine. Electronically Signed   By: Marin Roberts M.D.   On: 05/09/2022 12:56   CT ABDOMEN PELVIS W CONTRAST  Result Date: 05/08/2022 CLINICAL DATA:  Abdominal pain. Nausea vomiting and diarrhea. Weakness. EXAM: CT ABDOMEN AND PELVIS WITH CONTRAST TECHNIQUE: Multidetector CT imaging of the abdomen and pelvis was performed using the standard protocol following bolus administration of intravenous contrast.  RADIATION DOSE REDUCTION: This exam was performed according to the departmental dose-optimization program which includes automated exposure control, adjustment of the mA and/or kV according to patient size and/or use of iterative reconstruction technique. CONTRAST:  165m OMNIPAQUE IOHEXOL 300 MG/ML  SOLN COMPARISON:  07/24/2021 FINDINGS: Lower chest: No acute abnormality. Hepatobiliary: Subjective hepatic steatosis. No enhancing liver abnormality. Gallbladder normal. No bile duct dilatation. Pancreas: Unremarkable. No pancreatic ductal dilatation or surrounding inflammatory changes. Spleen: Normal in size without focal abnormality. Adrenals/Urinary Tract: Normal adrenal glands. Mildly complex cyst within the inferior pole of the right kidney measures 1.2 x 0.8 cm. This is been unchanged since 12/28/2017 and likely represents a benign cyst. No follow-up imaging recommended. Bosniak class 1 cyst arises off the upper pole of the left kidney measuring 2.3 cm. Additional too small to characterize hypodense kidney lesions are also new compatible with Bosniak class 2 lesions. No follow-up imaging recommended. No nephrolithiasis or hydronephrosis.  Urinary bladder is unremarkable. Stomach/Bowel: Stomach appears normal. The appendix is visualized and appears normal. Diffuse colonic diverticulosis identified without signs of acute diverticulitis. No bowel wall thickening, inflammation, or distension. The Vascular/Lymphatic: Aortic atherosclerosis without aneurysm. No signs abdominopelvic adenopathy. Reproductive: Status post hysterectomy. No adnexal masses. Other: No ascites or focal fluid collections. Fat containing umbilical hernia measures 3.5 by 2.7 by 4.4 cm, image 56/2. No signs of pneumoperitoneum. Musculoskeletal: No acute or suspicious osseous findings. Previous ORIF of the proximal right femur. IMPRESSION: 1. No acute findings within the abdomen or pelvis. 2. Diffuse colonic diverticulosis without signs of acute  diverticulitis. 3. Fat containing umbilical hernia. 4. Subjective hepatic steatosis. 5.  Aortic Atherosclerosis (ICD10-I70.0). Electronically Signed   By: Kerby Moors M.D.   On: 05/08/2022 08:37   DG Chest Port 1 View  Result Date: 05/08/2022 CLINICAL DATA:  Vomiting EXAM: PORTABLE CHEST 1 VIEW COMPARISON:  04/17/22 cxr FINDINGS: No pleural effusion. Pneumothorax. Unchanged patchy bilateral airspace opacities. Unchanged cardiac and mediastinal contours. No new focal airspace opacity. Visualized upper abdomen is notable for a gas distended stomach. No radiographically apparent displaced rib fractures. IMPRESSION: 1. No new focal airspace opacity. 2. Gas distended stomach. Electronically Signed   By: Marin Roberts M.D.   On: 05/08/2022 07:56     Assessment and Plan:   Elevated troponin: High-sensitivity troponin I modestly elevated on initial check, slightly lower on recheck today in the setting of fall.  Presentation is not consistent with ACS.  However, I think it is reasonable to proceed with therapeutic anticoagulation with heparin, to be continued for 48 hours, as well as low-dose aspirin.  We have discussed further evaluation options and have agreed to obtain an echocardiogram to exclude new cardiomyopathy or other significant changes from her prior study in 2022.  Ms. Labelle does not wish to undergo any invasive procedures and would favor medical therapy given her age and comorbidities.  It would be worthwhile to check a total creatine kinase to ensure that her troponin elevation is not related to skeletal muscle injury/rhabdomyolysis after she was found down this morning for uncertain duration.  PE felt to be less likely given that Ms. Bertelsen is chronically anticoagulated with apixaban.  Defer statin therapy in the setting of transaminitis; this can be readdressed as an outpatient.  Recurrent falls: Ms. Bolash has had recurrent falls and syncope for several years with prior workup being  unrevealing other than paroxysmal atrial fibrillation.  Limited history surrounding today's fall makes it difficult to determine if arrhythmia may have been playing a role.  We will continue telemetry monitoring.  Repeat ambulatory cardiac monitoring could be discussed with the patient prior to discharge.  Abnormal liver function tests: Question if abdominal discomfort, nausea, and presyncope may have been related to developing hepatobiliary pathology, given mild transaminitis, elevated bilirubin, and elevated lipase.  Right upper quadrant ultrasound has already been ordered.  Paroxysmal atrial fibrillation: No evidence of recurrence.  Continue telemetry monitoring.  Apixaban on hold with anticoagulation with heparin, as detailed above, in the setting of elevated troponin.  For questions or updates, please contact [place] Please consult [email or web address] for contact info under [facility] Cardiology.  Signed, Nelva Bush, MD  05/09/2022 4:37 PM

## 2022-05-09 NOTE — ED Triage Notes (Signed)
Pt here from [facility] with fall this morning. Pt denies hitting her head, states she roll out of bed onto the floor. Pt is on a blood thinner. Pt denies pain.

## 2022-05-09 NOTE — Assessment & Plan Note (Signed)
Patient has a history of acute pancreatitis in May 2023 that was suspected to be secondary to [place].  LFTs are markedly elevated today compared to 1 day prior with AST 331 and ALT 310.  In addition, lipase has trended up to 562 from 390 the day prior.  Given changes in LFTs and lipase and right upper quadrant pain on exam, most consistent with acute on chronic pancreatitis.  Interestingly, patient was recently restarted on Macrobid 2 weeks ago.  - Stop Macrobid; if no other etiology of pancreatitis can be identified, will need to add Macrobid to patient's allergy list - Right upper quadrant ultrasound pending - IV fluid resuscitation

## 2022-05-10 ENCOUNTER — Inpatient Hospital Stay (HOSPITAL_COMMUNITY)
Admit: 2022-05-10 | Discharge: 2022-05-10 | Disposition: A | Discharge status: Home / Self Care | Payer: Medicare Other | Attending: Internal Medicine | Admitting: Internal Medicine

## 2022-05-10 ENCOUNTER — Inpatient Hospital Stay: Payer: Medicare Other

## 2022-05-10 DIAGNOSIS — I214 Non-ST elevation (NSTEMI) myocardial infarction: Secondary | ICD-10-CM | POA: Diagnosis not present

## 2022-05-10 DIAGNOSIS — R931 Abnormal findings on diagnostic imaging of heart and coronary circulation: Secondary | ICD-10-CM

## 2022-05-10 DIAGNOSIS — E785 Hyperlipidemia, unspecified: Secondary | ICD-10-CM

## 2022-05-10 DIAGNOSIS — R7989 Other specified abnormal findings of blood chemistry: Secondary | ICD-10-CM

## 2022-05-10 LAB — GLUCOSE, CAPILLARY
Glucose-Capillary: 190 mg/dL — ABNORMAL HIGH (ref 70–99)
Glucose-Capillary: 186 mg/dL — ABNORMAL HIGH (ref 70–99)
Glucose-Capillary: 178 mg/dL — ABNORMAL HIGH (ref 70–99)
Glucose-Capillary: 157 mg/dL — ABNORMAL HIGH (ref 70–99)
Glucose-Capillary: 160 mg/dL — ABNORMAL HIGH (ref 70–99)

## 2022-05-10 LAB — BASIC METABOLIC PANEL
Anion gap: 9 (ref 5–15)
BUN: 24 mg/dL — ABNORMAL HIGH (ref 8–23)
CO2: 24 mmol/L (ref 22–32)
Calcium: 8.7 mg/dL — ABNORMAL LOW (ref 8.9–10.3)
Chloride: 103 mmol/L (ref 98–111)
Creatinine, Ser: 0.79 mg/dL (ref 0.44–1.00)
GFR, Estimated: >60 mL/min (ref >60)
Glucose, Bld: 246 mg/dL — ABNORMAL HIGH (ref 70–99)
Potassium: 3.4 mmol/L — ABNORMAL LOW (ref 3.5–5.1)
Sodium: 136 mmol/L (ref 135–145)

## 2022-05-10 LAB — LIPASE, BLOOD: Lipase: 686 U/L — ABNORMAL HIGH (ref 11–51)

## 2022-05-10 LAB — ECHOCARDIOGRAM COMPLETE
AV Peak grad: 5.7 mmHg
Ao pk vel: 1.19 m/s
Area-P 1/2: 1.6 cm2
Height: 60 in
S' Lateral: 3.1 cm
Weight: 2740.76 oz

## 2022-05-10 LAB — APTT
aPTT: 66 seconds — ABNORMAL HIGH (ref 24–36)
aPTT: 52 seconds — ABNORMAL HIGH (ref 24–36)
aPTT: 55 seconds — ABNORMAL HIGH (ref 24–36)

## 2022-05-10 LAB — LIPID PANEL
Cholesterol: 198 mg/dL (ref 0–200)
HDL: 38 mg/dL — ABNORMAL LOW (ref >40)
LDL Cholesterol: 118 mg/dL — ABNORMAL HIGH (ref 0–99)
Total CHOL/HDL Ratio: 5.2 RATIO
Triglycerides: 208 mg/dL — ABNORMAL HIGH (ref <150)
VLDL: 42 mg/dL — ABNORMAL HIGH (ref 0–40)

## 2022-05-10 LAB — CBC
HCT: 32.6 % — ABNORMAL LOW (ref 36.0–46.0)
Hemoglobin: 10.9 g/dL — ABNORMAL LOW (ref 12.0–15.0)
MCH: 29.5 pg (ref 26.0–34.0)
MCHC: 33.4 g/dL (ref 30.0–36.0)
MCV: 88.1 fL (ref 80.0–100.0)
Platelets: 158 10*3/uL (ref 150–400)
RBC: 3.7 MIL/uL — ABNORMAL LOW (ref 3.87–5.11)
RDW: 12.3 % (ref 11.5–15.5)
WBC: 7.8 10*3/uL (ref 4.0–10.5)
nRBC: 0 % (ref 0.0–0.2)

## 2022-05-10 LAB — CK: Total CK: 216 U/L (ref 38–234)

## 2022-05-10 LAB — HEPARIN LEVEL (UNFRACTIONATED)
Heparin Unfractionated: >1.1 IU/mL — ABNORMAL HIGH (ref 0.30–0.70)
Heparin Unfractionated: >1.1 IU/mL — ABNORMAL HIGH (ref 0.30–0.70)

## 2022-05-10 LAB — MAGNESIUM: Magnesium: 1.6 mg/dL — ABNORMAL LOW (ref 1.7–2.4)

## 2022-05-10 MED ORDER — PERFLUTREN LIPID MICROSPHERE
1.0000 mL | INTRAVENOUS | Status: AC | PRN
  Administered 2022-05-10: 6 mL via INTRAVENOUS

## 2022-05-10 MED ORDER — HEPARIN BOLUS VIA INFUSION
2000.0000 [IU] | Freq: Once | INTRAVENOUS | Status: AC
  Administered 2022-05-10: 2000 [IU] via INTRAVENOUS
  Filled 2022-05-10: qty 2000

## 2022-05-10 NOTE — Progress Notes (Signed)
[place] for Heparin  Indication: chest pain/ACS and afib   Allergies  Allergen Reactions   Morphine Sulfate Other (See Comments)    BP bottoms out   Penicillins Diarrhea    Has patient had a PCN reaction causing immediate rash, facial/tongue/throat swelling, SOB or lightheadedness with hypotension: Yes Has patient had a PCN reaction causing severe rash involving mucus membranes or skin necrosis: Yes Has patient had a PCN reaction that required hospitalization No Has patient had a PCN reaction occurring within the last 10 years: No If all of the above answers are "NO", then may proceed with Cephalosporin use.    Patient Measurements: Height: 5' (152.4 cm) Weight: 77.7 kg (171 lb 4.8 oz) IBW/kg (Calculated) : 45.5 Heparin Dosing Weight: 63.1  Vital Signs: Temp: 98.2 F (36.8 C) (03/02 0754) Temp Source: Oral (03/02 0754) BP: 162/77 (03/02 0755) Pulse Rate: 86 (03/02 0755)  Labs: Recent Labs    05/08/22 0634 05/08/22 0852 05/08/22 1055 05/09/22 1240 05/09/22 1440 05/09/22 1530 05/10/22 0104 05/10/22 0511 05/10/22 0837  HGB 13.0  --   --  12.5  --   --  10.9*  --   --   HCT 39.2  --   --  38.6  --   --  32.6*  --   --   PLT 193  --   --  183  --   --  158  --   --   APTT  --   --   --   --   --  39* 66*  --  52*  LABPROT  --   --   --  19.5*  --   --   --   --   --   INR  --   --   --  1.7*  --   --   --   --   --   HEPARINUNFRC  --   --   --   --   --  >1.10*  --  >1.10* >1.10*  CREATININE 0.94  --   --  1.08*  --   --  0.79  --   --   CKTOTAL  --   --   --   --   --   --   --   --  216  TROPONINIHS 12   < > 22* 964* 846*  --   --   --   --    < > = values in this interval not displayed.     Estimated Creatinine Clearance: 44.8 mL/min (by C-G formula based on SCr of 0.79 mg/dL).   Medical History: Past Medical History:  Diagnosis Date   Acute pancreatitis 07/24/2021   Basal cell carcinoma 03/30/2008   left upper  arm/excision   Basal cell carcinoma 03/31/2008   left upper arm, right lower leg   Basal cell carcinoma 01/18/2009   left upper arm   Collagenous colitis    Diabetes mellitus without complication ([place])    Diverticulitis    Elevated LFTs 07/25/2021   Heart murmur    at birth, none since   History of echocardiogram    a. 11/2015: echo showing EF of 60-65% with no WMA. Grade 1 DD and mild MR noted.    Hyperlipidemia    Per [facility]   Hypertension    Intertrochanteric fracture of right femur ([place]) 06/11/2019   Lack of coordination    Per [facility]  Long term current use of anticoagulant    Per [facility]   Noninfective gastroenteritis and colitis, unspecified    Per [facility]   PAF (paroxysmal atrial fibrillation) ([place])    a. initially diagnosed in 08/2016 --> started on Eliquis   Squamous cell carcinoma of skin 05/16/2020   Left nasal tip anterior to ala, refer to [facility]   Syncope    a. initially occurring in Fall 2016 b. Hospitalized in 10/2015 and 11/2015 for recurrent episodes.     Medications:  Apixaban PTA  Assessment: Pharmacy consulted for heparin infusion dosing and monitoring for 89 yo female for NSTEMI/elevated troponin. Patient was brought to ED after a fall at facility. She has PMH A. Fib and was taking apixaban prior to admission. Last dose of apixaban reported 2/29 @ 2000. Not consistent with ACS per cards.   3/2 0837 HL > 1.1 aPTT 52   Goal of Therapy:  Heparin level 0.3-0.7 units/ml once heparin and aPTT are correlating  aPTT 66-102 seconds Monitor platelets by anticoagulation protocol: Yes   Plan:  aPTT is subtherapeutic. Heparin level remains falsely elevated. Will give heparin bolus 2000 units x 1 and increase heparin infusion to 850 units/hr. Recheck aPTT in 8 hours. HL and CBC with AM labs. Switch to heparin level monitoring once aPTT and heparin level are correlating. Plan to continue heparin for 48 hours.    Oswald Hillock, PharmD,  BCPS Clinical Pharmacist 05/10/2022 9:07 AM

## 2022-05-10 NOTE — Progress Notes (Signed)
ANTICOAGULATION CONSULT NOTE - Initial Consult  Pharmacy Consult for Heparin infusion dosing and monitoring  Indication: chest pain/ACS  Allergies  Allergen Reactions   Morphine Sulfate Other (See Comments)    BP bottoms out   Penicillins Diarrhea    Has patient had a PCN reaction causing immediate rash, facial/tongue/throat swelling, SOB or lightheadedness with hypotension: Yes Has patient had a PCN reaction causing severe rash involving mucus membranes or skin necrosis: Yes Has patient had a PCN reaction that required hospitalization No Has patient had a PCN reaction occurring within the last 10 years: No If all of the above answers are "NO", then may proceed with Cephalosporin use.    Patient Measurements: Height: 5' (152.4 cm) Weight: 77.7 kg (171 lb 4.8 oz) IBW/kg (Calculated) : 45.5 Heparin Dosing Weight: 63.1  Vital Signs: Temp: 97.7 F (36.5 C) (03/01 2349) Temp Source: Oral (03/01 1548) BP: 150/67 (03/01 2349) Pulse Rate: 71 (03/01 2349)  Labs: Recent Labs    05/08/22 0634 05/08/22 0852 05/08/22 1055 05/09/22 1240 05/09/22 1440 05/09/22 1530 05/10/22 0104  HGB 13.0  --   --  12.5  --   --  10.9*  HCT 39.2  --   --  38.6  --   --  32.6*  PLT 193  --   --  183  --   --  158  APTT  --   --   --   --   --  39* 66*  LABPROT  --   --   --  19.5*  --   --   --   INR  --   --   --  1.7*  --   --   --   HEPARINUNFRC  --   --   --   --   --  >1.10*  --   CREATININE 0.94  --   --  1.08*  --   --   --   TROPONINIHS 12   < > 22* 964* 846*  --   --    < > = values in this interval not displayed.     Estimated Creatinine Clearance: 33.2 mL/min (A) (by C-G formula based on SCr of 1.08 mg/dL (H)).   Medical History: Past Medical History:  Diagnosis Date   Acute pancreatitis 07/24/2021   Basal cell carcinoma 03/30/2008   left upper arm/excision   Basal cell carcinoma 03/31/2008   left upper arm, right lower leg   Basal cell carcinoma 01/18/2009   left upper arm    Collagenous colitis    Diabetes mellitus without complication ([place])    Diverticulitis    Elevated LFTs 07/25/2021   Heart murmur    at birth, none since   History of echocardiogram    a. 11/2015: echo showing EF of 60-65% with no WMA. Grade 1 DD and mild MR noted.    Hyperlipidemia    Per [facility]   Hypertension    Intertrochanteric fracture of right femur ([place]) 06/11/2019   Lack of coordination    Per [facility] term current use of anticoagulant    Per [facility]   Noninfective gastroenteritis and colitis, unspecified    Per [facility]   PAF (paroxysmal atrial fibrillation) ([place])    a. initially diagnosed in 08/2016 --> started on Eliquis   Squamous cell carcinoma of skin 05/16/2020   Left nasal tip anterior to ala, refer to [facility]   Syncope    a. initially occurring  in Fall 2016 b. Hospitalized in 10/2015 and 11/2015 for recurrent episodes.     Medications:  Apixaban PTA  Assessment: Pharmacy consulted for heparin infusion dosing and monitoring for 89 yo female for NSTEMI/elevated troponin. Patient was brought to ED after a fall at facility. She has PMH A. Fib and was taking apixaban prior to admission. Last dose of apixaban reported 2/29 @ 2000.   Troponin: 22>964>>846  Goal of Therapy:  Heparin level 0.3-0.7 units/ml aPTT 66-102 seconds Monitor platelets by anticoagulation protocol: Yes   Plan:  3/2 @ 0104: aPTT = 66, therapeutic X 1  - Will continue pt on current rate and recheck aPTT and HL in 8 hrs on 3/2 @ 0900  Will need to adjust heparin infusion by aPTT level until aPTT and anti-Xa (heparin levels) correlate.  Continue to monitor H&H and platelets  Jermiyah Ricotta D, PharmD Clinical Pharmacist 05/10/2022 1:48 AM

## 2022-05-10 NOTE — Progress Notes (Signed)
[place] for Heparin  Indication: chest pain/ACS and afib   Allergies  Allergen Reactions   Morphine Sulfate Other (See Comments)    BP bottoms out   Penicillins Diarrhea    Has patient had a PCN reaction causing immediate rash, facial/tongue/throat swelling, SOB or lightheadedness with hypotension: Yes Has patient had a PCN reaction causing severe rash involving mucus membranes or skin necrosis: Yes Has patient had a PCN reaction that required hospitalization No Has patient had a PCN reaction occurring within the last 10 years: No If all of the above answers are "NO", then may proceed with Cephalosporin use.    Patient Measurements: Height: 5' (152.4 cm) Weight: 77.7 kg (171 lb 4.8 oz) IBW/kg (Calculated) : 45.5 Heparin Dosing Weight: 63.1  Vital Signs: Temp: 98 F (36.7 C) (03/02 1639) Temp Source: Oral (03/02 1639) BP: 158/67 (03/02 1639) Pulse Rate: 83 (03/02 1639)  Labs: Recent Labs    05/08/22 0634 05/08/22 0852 05/08/22 1055 05/09/22 1240 05/09/22 1440 05/09/22 1530 05/09/22 1530 05/10/22 0104 05/10/22 0511 05/10/22 0837 05/10/22 1837  HGB 13.0  --   --  12.5  --   --   --  10.9*  --   --   --   HCT 39.2  --   --  38.6  --   --   --  32.6*  --   --   --   PLT 193  --   --  183  --   --   --  158  --   --   --   APTT  --   --   --   --   --  39*   < > 66*  --  52* 55*  LABPROT  --   --   --  19.5*  --   --   --   --   --   --   --   INR  --   --   --  1.7*  --   --   --   --   --   --   --   HEPARINUNFRC  --   --   --   --   --  >1.10*  --   --  >1.10* >1.10*  --   CREATININE 0.94  --   --  1.08*  --   --   --  0.79  --   --   --   CKTOTAL  --   --   --   --   --   --   --   --   --  216  --   TROPONINIHS 12   < > 22* 964* 846*  --   --   --   --   --   --    < > = values in this interval not displayed.     Estimated Creatinine Clearance: 44.8 mL/min (by C-G formula based on SCr of 0.79 mg/dL).   Medical  History: Past Medical History:  Diagnosis Date   Acute pancreatitis 07/24/2021   Basal cell carcinoma 03/30/2008   left upper arm/excision   Basal cell carcinoma 03/31/2008   left upper arm, right lower leg   Basal cell carcinoma 01/18/2009   left upper arm   Collagenous colitis    Diabetes mellitus without complication ([place])    Diverticulitis    Elevated LFTs 07/25/2021   Heart murmur    at birth,  none since   History of echocardiogram    a. 11/2015: echo showing EF of 60-65% with no WMA. Grade 1 DD and mild MR noted.    Hyperlipidemia    Per [facility]   Hypertension    Intertrochanteric fracture of right femur ([place]) 06/11/2019   Lack of coordination    Per [facility] term current use of anticoagulant    Per [facility]   Noninfective gastroenteritis and colitis, unspecified    Per [facility]   PAF (paroxysmal atrial fibrillation) ([place])    a. initially diagnosed in 08/2016 --> started on Eliquis   Squamous cell carcinoma of skin 05/16/2020   Left nasal tip anterior to ala, refer to [facility]   Syncope    a. initially occurring in Fall 2016 b. Hospitalized in 10/2015 and 11/2015 for recurrent episodes.     Medications:  Apixaban PTA  Assessment: Pharmacy consulted for heparin infusion dosing and monitoring for 89 yo female for NSTEMI/elevated troponin. Patient was brought to ED after a fall at facility. She has PMH A. Fib and was taking apixaban prior to admission. Last dose of apixaban reported 2/29 @ 2000. Not consistent with ACS per cards.   3/2 0837 HL > 1.1 aPTT 52 3/2 1837 aPTT 55  Goal of Therapy:  Heparin level 0.3-0.7 units/ml once heparin and aPTT are correlating  aPTT 66-102 seconds Monitor platelets by anticoagulation protocol: Yes   Plan: aPTT is subtherapeutic Will give heparin bolus 2000 units x 1 and increase heparin infusion to 1,050 units/hr. Recheck aPTT in 8 hours. HL and CBC with AM labs. Switch to heparin level monitoring once aPTT and  heparin level are correlating. Plan to continue heparin for 48 hours.    Wynelle Cleveland, PharmD, BCPS Clinical Pharmacist 05/10/2022 6:57 PM

## 2022-05-10 NOTE — Progress Notes (Signed)
  Echocardiogram 2D Echocardiogram has been performed. Definity IV ultrasound imaging agent used on this study.  Lars Masson 05/10/2022, 12:33 PM

## 2022-05-10 NOTE — Progress Notes (Addendum)
Progress Note   [name] [id number] DOB: 29-Apr-1931 DOA: 05/09/2022     1 DOS: the patient was seen and examined on 05/10/2022   Brief hospital course:  [name] is a 89 y.o. female with medical history significant of atrial fibrillation on Eliquis, uncontrolled type 2 diabetes, hypertension, hyperlipidemia, idiopathic pancreatitis, collagenous colitis, diverticulitis, who presents to the ED after a ground-level fall.   [name] states that she must of rolled off her bed in her sleep and she does not remember the event happening.  She denies any symptoms at this time including chest pain, nausea, vomiting, abdominal pain, shortness of breath, palpitations.  Patient's daughter at bedside, [name] states that yesterday they were in the ER for nausea, vomiting and [name] had abdominal pain at that time. [name] cannot recall this.    Per patient's daughter, [name] was started on Macrobid approximately 2 weeks ago for a UTI.    ED course: On arrival to the ED, patient was normotensive at 132/67 with heart rate of 86.  She was saturating at 95% on room air.  She was afebrile at 98.8.  Initial workup remarkable for WBC of 11.8, hemoglobin 12.5, platelets of 183, potassium 4.4, BUN 27, creatinine 1.08, AST 331, ALT 310, total bilirubin of 1.8 and GFR 49.  CT of the head and C-spine were obtained that did not show any acute abnormalities with the exception of mild soft tissue swelling over the left frontal scalp.  Chest x-ray was obtained that demonstrated mild streaky bibasilar opacities likely representing atelectasis.  TR[facility] contacted for admission  3/1 : Cardiology was consulted and the recommendation was to continue heparinizing the patient.  Evaluate with echo.  Patient does not want to undergo aggressive intervention with cardiac cath.  Repeat CK levels.  Assessment and Plan: * NSTEMI (non-ST elevated myocardial infarction) [facility]) Patient presenting after  rolling off bed.  Her troponin on presentation elevated as high as 960, with downtrend.  No EKG changes and patient is chest pain-free (although patient is a poor historian).  However she has multiple risk factors including uncontrolled type 2 diabetes, uncontrolled hyperlipidemia, and hypertension.  Due to this, will initiate heparin infusion and discussed with cardiology.  - Cardiology consulted; appreciate their recommendations - Continue heparin infusion per pharmacy dosing - S/p aspirin 324 mg once - Start aspirin 81 mg daily tomorrow - Hold off on initiating statin given elevated LFTs - Echocardiogram was being done at the time of my evaluation.  Acute on chronic pancreatitis  Patient has a history of acute pancreatitis in May 2023 that was suspected to be secondary to [place].  LFTs are markedly elevated today compared to 1 day prior with AST 331 and ALT 310.  In addition, lipase has trended up to 562 from 390 the day prior.  Given changes in LFTs and lipase and right upper quadrant pain on exam, most consistent with acute on chronic pancreatitis.  Interestingly, patient was recently restarted on Macrobid 2 weeks ago.  - Stop Macrobid; if no other etiology of pancreatitis can be identified, will need to add Macrobid to patient's allergy list - Right upper quadrant ultrasound pending - IV fluid resuscitation  Ground-level fall Per patient, she rolled off the bed when she was asleep, however she cannot recall the event.  No injuries noted on exam and CT head without any acute intracranial abnormalities.  -PT/OT  Type 2 diabetes mellitus without complications ([place]) History of uncontrolled diabetes with CBG  as high as 411 on admission.  Last A1c approximately 3 weeks ago at 8.3%  - Hold home antiglycemic agents - Start SSI, moderate - Start Semglee 10 units at bedtime  Paroxysmal atrial fibrillation (H[facility]) - Continue home metoprolol - Hold home Eliquis given heparin  infusion  Essential hypertension - Hold lisinopril at this time given slight increase in creatinine  AKI (acute kidney injury) ([place]) Likely prerenal in the setting of NSTEMI and acute pancreatitis  - Repeat BMP in the a.m. - IV fluids as ordered - Hold home nephrotoxic agents - Bladder scan  COPD (chronic obstructive pulmonary disease) ([place]) - Continue home supplemental oxygen as needed - Continue home bronchodilators    Subjective: Patient seen and examined this morning.  No overnight event per nursing.  Vital labs and imaging reviewed.  Patient with no complaint of chest pain or shortness of breath. Physical Exam: Vitals:   05/09/22 2349 05/10/22 0326 05/10/22 0754 05/10/22 0755  BP: (!) 150/67 (!) 147/70  (!) 162/77  Pulse: 71 73 84 86  Resp: '18 19 20   '$ Temp: 97.7 F (36.5 C) 98.4 F (36.9 C) 98.2 F (36.8 C)   TempSrc:   Oral   SpO2: 97% 96% 95% 96%  Weight:      Height:        Physical Exam HENT:     Head: Normocephalic.  Eyes:     Pupils: Pupils are equal, round, and reactive to light.  Cardiovascular:     Rate and Rhythm: Normal rate.  Pulmonary:     Effort: Pulmonary effort is normal.  Abdominal:     General: Abdomen is flat.     Palpations: Abdomen is soft.  Musculoskeletal:        General: Normal range of motion.     Cervical back: Normal range of motion.  Skin:    General: Skin is warm.  Neurological:     General: No focal deficit present.     Mental Status: She is alert.  Psychiatric:        Mood and Affect: Mood normal.        Behavior: Behavior normal.     Data Reviewed:  Results are pending, will review when available.  Family Communication: No family at bedside.  Disposition: Status is: Inpatient Remains inpatient appropriate because: NSTEMI     Time spent: 32 minutes  Author: Oran Rein, MD 05/10/2022 8:03 AM  For on call review [email or web address].

## 2022-05-10 NOTE — Progress Notes (Signed)
Rounding Note    Patient Name: [name] Date of Encounter: 05/10/2022  [place] Cardiologist: Nelva Bush, MD   Subjective   Patient seen on AM rounds.  Inpatient Medications    Scheduled Meds:  aspirin EC  81 mg Oral Daily   heparin  2,000 Units Intravenous Once   insulin aspart  0-15 Units Subcutaneous TID WC   insulin glargine-yfgn  10 Units Subcutaneous QHS   metoprolol succinate  25 mg Oral Daily   Continuous Infusions:  heparin 750 Units/hr (05/09/22 1832)   lactated ringers 100 mL/hr at 05/10/22 0419   PRN Meds: acetaminophen, albuterol, nitroGLYCERIN, ondansetron (ZOFRAN) IV   Vital Signs    Vitals:   05/09/22 2349 05/10/22 0326 05/10/22 0754 05/10/22 0755  BP: (!) 150/67 (!) 147/70  (!) 162/77  Pulse: 71 73 84 86  Resp: '18 19 20   '$ Temp: 97.7 F (36.5 C) 98.4 F (36.9 C) 98.2 F (36.8 C)   TempSrc:   Oral   SpO2: 97% 96% 95% 96%  Weight:      Height:        Intake/Output Summary (Last 24 hours) at 05/10/2022 0926 Last data filed at 05/10/2022 0400 Gross per 24 hour  Intake 1268.39 ml  Output --  Net 1268.39 ml      05/09/2022    3:48 PM 05/09/2022    8:57 AM 05/08/2022    6:30 AM  Last 3 Weights  Weight (lbs) 171 lb 4.8 oz 196 lb 3.4 oz 196 lb 3.2 oz  Weight (kg) 77.7 kg 89 kg 88.996 kg      Telemetry    SR  - Personally Reviewed  ECG    NO new  - Personally Reviewed  Physical Exam   GEN: No acute distress.   Neck: JVP is not elevated  Cardiac: RRR, no murmurs Respiratory: Clear to auscultation bilaterally. GI: Soft, nontender  MS: No edema;  Labs    High Sensitivity Troponin:   Recent Labs  Lab 05/08/22 0634 05/08/22 0852 05/08/22 1055 05/09/22 1240 05/09/22 1440  TROPONINIHS 12 20* 22* 964* 846*     Chemistry Recent Labs  Lab 05/08/22 0634 05/09/22 1240 05/10/22 0104  NA 140 138 136  K 4.1 4.4 3.4*  CL 105 101 103  CO2 '26 24 24  '$ GLUCOSE 327* 411* 246*  BUN 27* 27* 24*  CREATININE 0.94  1.08* 0.79  CALCIUM 9.4 9.5 8.7*  MG  --   --  1.6*  PROT 6.7 7.5  --   ALBUMIN 3.5 3.5  --   AST 53* 331*  --   ALT 46* 310*  --   ALKPHOS 50 92  --   BILITOT 0.9 1.8*  --   GFRNONAA 58* 49* >60  ANIONGAP '9 13 9    '$ Lipids  Recent Labs  Lab 05/09/22 1418  CHOL 202*  TRIG 133  HDL 50  LDLCALC 125*  CHOLHDL 4.0    Hematology Recent Labs  Lab 05/08/22 0634 05/09/22 1240 05/10/22 0104  WBC 10.4 11.8* 7.8  RBC 4.37 4.23 3.70*  HGB 13.0 12.5 10.9*  HCT 39.2 38.6 32.6*  MCV 89.7 91.3 88.1  MCH 29.7 29.6 29.5  MCHC 33.2 32.4 33.4  RDW 11.8 12.1 12.3  PLT 193 183 158   Thyroid No results for input(s): "TSH", "FREET4" in the last 168 hours.  BNPNo results for input(s): "BNP", "PROBNP" in the last 168 hours.  DDimer No results for input(s): "DDIMER" in  the last 168 hours.   Radiology    DG Chest 1 View  Result Date: 05/09/2022 CLINICAL DATA:  weakness / fall this am EXAM: CHEST  1 VIEW COMPARISON:  Chest x-ray very 11/28/2022. FINDINGS: Lung volumes. Mild streaky bibasilar opacities. No confluent consolidation. No visible pneumothorax or pleural effusions. Vascular crowding due to low lung volumes. Cardiomediastinal silhouette is within normal limits for technique. IMPRESSION: Mild streaky bibasilar opacities likely represents atelectasis given low lung volumes. No confluent consolidation. Electronically Signed   By: Margaretha Sheffield M.D.   On: 05/09/2022 12:57   CT Head Wo Contrast  Result Date: 05/09/2022 CLINICAL DATA:  Fall EXAM: CT HEAD WITHOUT CONTRAST CT CERVICAL SPINE WITHOUT CONTRAST TECHNIQUE: Multidetector CT imaging of the head and cervical spine was performed following the standard protocol without intravenous contrast. Multiplanar CT image reconstructions of the cervical spine were also generated. RADIATION DOSE REDUCTION: This exam was performed according to the departmental dose-optimization program which includes automated exposure control, adjustment of the mA  and/or kV according to patient size and/or use of iterative reconstruction technique. COMPARISON:  CT Head 12/28/17 FINDINGS: CT HEAD FINDINGS Brain: No evidence of acute infarction, hemorrhage, hydrocephalus, extra-axial collection or mass lesion/mass effect. Sequela of moderate to severe chronic microvascular ischemic change. Vascular: No hyperdense vessel or unexpected calcification. Skull: Mild soft tissue swelling over the left frontal scalp Sinuses/Orbits: No middle ear or mastoid effusion. Paranasal sinuses are clear. Right lens replacement. Orbits are otherwise unremarkable. Other: None. CT CERVICAL SPINE FINDINGS Alignment: Normal. Skull base and vertebrae: No acute fracture. No primary bone lesion or focal pathologic process. Postsurgical changes from prior right hemilaminectomy at C6. Soft tissues and spinal canal: No prevertebral fluid or swelling. No visible canal hematoma. Disc levels:  No evidence of high-grade spinal canal stenosis. Upper chest: Negative. Other: None IMPRESSION: 1. No CT evidence of intracranial injury. 2. Mild soft tissue swelling over the left frontal scalp. 3. No acute fracture or traumatic malalignment of the cervical spine. Electronically Signed   By: Marin Roberts M.D.   On: 05/09/2022 12:56   CT Cervical Spine Wo Contrast  Result Date: 05/09/2022 CLINICAL DATA:  Fall EXAM: CT HEAD WITHOUT CONTRAST CT CERVICAL SPINE WITHOUT CONTRAST TECHNIQUE: Multidetector CT imaging of the head and cervical spine was performed following the standard protocol without intravenous contrast. Multiplanar CT image reconstructions of the cervical spine were also generated. RADIATION DOSE REDUCTION: This exam was performed according to the departmental dose-optimization program which includes automated exposure control, adjustment of the mA and/or kV according to patient size and/or use of iterative reconstruction technique. COMPARISON:  CT Head 12/28/17 FINDINGS: CT HEAD FINDINGS Brain: No  evidence of acute infarction, hemorrhage, hydrocephalus, extra-axial collection or mass lesion/mass effect. Sequela of moderate to severe chronic microvascular ischemic change. Vascular: No hyperdense vessel or unexpected calcification. Skull: Mild soft tissue swelling over the left frontal scalp Sinuses/Orbits: No middle ear or mastoid effusion. Paranasal sinuses are clear. Right lens replacement. Orbits are otherwise unremarkable. Other: None. CT CERVICAL SPINE FINDINGS Alignment: Normal. Skull base and vertebrae: No acute fracture. No primary bone lesion or focal pathologic process. Postsurgical changes from prior right hemilaminectomy at C6. Soft tissues and spinal canal: No prevertebral fluid or swelling. No visible canal hematoma. Disc levels:  No evidence of high-grade spinal canal stenosis. Upper chest: Negative. Other: None IMPRESSION: 1. No CT evidence of intracranial injury. 2. Mild soft tissue swelling over the left frontal scalp. 3. No acute fracture or traumatic malalignment of the  cervical spine. Electronically Signed   By: Marin Roberts M.D.   On: 05/09/2022 12:56    Cardiac Studies   Echo just done   Patient Profile   [name] is a 89 y.o. female with a hx of paroxysmal atrial fibrillation, hypertension, hyperlipidemia, type 2 diabetes mellitus, and recurrent syncope thought to be vasovagal in nature, who is being seen 05/09/2022 for the evaluation of elevated troponin at the request of Dr. Charleen Kirks.  Assessment & Plan    1 Elevated troponin  (20, 22, 964, 846)  CK normal  Denies CP     Plan is for medical Rx only   48 hours heparin and 81 mg Ec ASA  Echo just done   Will review    No changes for now       OK to hydrate   URine very concentrated   May need to pull back  2 PAF    Currently in SR   Eliquis on hold given heparin   3  GI  Abnormal LFTs   RUQ study done earlier    4  Type 2 DM  A1C 8.3   Pt lives in assisted living   Daughter says she gets a lot of carbs      Need to work with facility to minimize high carb options    5  LIpids  Check  For questions or updates, please contact [facility] Please consult [email or web address] for contact info under        Signed, SHERI HAMMOCK, NP  05/10/2022, 9:26 AM

## 2022-05-10 NOTE — Plan of Care (Signed)
  Problem: Education: Goal: Knowledge of General Education information will improve Description: Including pain rating scale, medication(s)/side effects and non-pharmacologic comfort measures Outcome: Progressing   Problem: Health Behavior/Discharge Planning: Goal: Ability to manage health-related needs will improve Outcome: Progressing   Problem: Clinical Measurements: Goal: Ability to maintain clinical measurements within normal limits will improve Outcome: Progressing Goal: Will remain free from infection Outcome: Progressing Goal: Diagnostic test results will improve Outcome: Progressing Goal: Respiratory complications will improve Outcome: Progressing Goal: Cardiovascular complication will be avoided Outcome: Progressing   Problem: Activity: Goal: Risk for activity intolerance will decrease Outcome: Progressing   Problem: Nutrition: Goal: Adequate nutrition will be maintained Outcome: Progressing   Problem: Coping: Goal: Level of anxiety will decrease Outcome: Progressing   Problem: Elimination: Goal: Will not experience complications related to bowel motility Outcome: Progressing Goal: Will not experience complications related to urinary retention Outcome: Progressing   Problem: Pain Managment: Goal: General experience of comfort will improve Outcome: Progressing   Problem: Safety: Goal: Ability to remain free from injury will improve Outcome: Progressing   Problem: Skin Integrity: Goal: Risk for impaired skin integrity will decrease Outcome: Progressing   Problem: Education: Goal: Understanding of cardiac disease, CV risk reduction, and recovery process will improve Outcome: Progressing Goal: Individualized Educational Video(s) Outcome: Progressing   Problem: Activity: Goal: Ability to tolerate increased activity will improve Outcome: Progressing   Problem: Cardiac: Goal: Ability to achieve and maintain adequate cardiovascular perfusion will  improve Outcome: Progressing   Problem: Health Behavior/Discharge Planning: Goal: Ability to safely manage health-related needs after discharge will improve Outcome: Progressing   Problem: Education: Goal: Ability to describe self-care measures that may prevent or decrease complications (Diabetes Survival Skills Education) will improve Outcome: Progressing Goal: Individualized Educational Video(s) Outcome: Progressing   Problem: Coping: Goal: Ability to adjust to condition or change in health will improve Outcome: Progressing   Problem: Fluid Volume: Goal: Ability to maintain a balanced intake and output will improve Outcome: Progressing   Problem: Health Behavior/Discharge Planning: Goal: Ability to identify and utilize available resources and services will improve Outcome: Progressing Goal: Ability to manage health-related needs will improve Outcome: Progressing   Problem: Metabolic: Goal: Ability to maintain appropriate glucose levels will improve Outcome: Progressing   Problem: Nutritional: Goal: Maintenance of adequate nutrition will improve Outcome: Progressing Goal: Progress toward achieving an optimal weight will improve Outcome: Progressing   Problem: Tissue Perfusion: Goal: Adequacy of tissue perfusion will improve Outcome: Progressing

## 2022-05-11 DIAGNOSIS — I214 Non-ST elevation (NSTEMI) myocardial infarction: Secondary | ICD-10-CM | POA: Diagnosis not present

## 2022-05-11 LAB — CBC
HCT: 33.4 % — ABNORMAL LOW (ref 36.0–46.0)
Hemoglobin: 11.3 g/dL — ABNORMAL LOW (ref 12.0–15.0)
MCH: 29.7 pg (ref 26.0–34.0)
MCHC: 33.8 g/dL (ref 30.0–36.0)
MCV: 87.7 fL (ref 80.0–100.0)
Platelets: 190 10*3/uL (ref 150–400)
RBC: 3.81 MIL/uL — ABNORMAL LOW (ref 3.87–5.11)
RDW: 12.4 % (ref 11.5–15.5)
WBC: 6.6 10*3/uL (ref 4.0–10.5)
nRBC: 0 % (ref 0.0–0.2)

## 2022-05-11 LAB — BASIC METABOLIC PANEL
Anion gap: 9 (ref 5–15)
BUN: 15 mg/dL (ref 8–23)
CO2: 21 mmol/L — ABNORMAL LOW (ref 22–32)
Calcium: 8.2 mg/dL — ABNORMAL LOW (ref 8.9–10.3)
Chloride: 105 mmol/L (ref 98–111)
Creatinine, Ser: 0.76 mg/dL (ref 0.44–1.00)
GFR, Estimated: >60 mL/min (ref >60)
Glucose, Bld: 172 mg/dL — ABNORMAL HIGH (ref 70–99)
Potassium: 3.7 mmol/L (ref 3.5–5.1)
Sodium: 135 mmol/L (ref 135–145)

## 2022-05-11 LAB — GLUCOSE, CAPILLARY
Glucose-Capillary: 195 mg/dL — ABNORMAL HIGH (ref 70–99)
Glucose-Capillary: 207 mg/dL — ABNORMAL HIGH (ref 70–99)
Glucose-Capillary: 232 mg/dL — ABNORMAL HIGH (ref 70–99)
Glucose-Capillary: 168 mg/dL — ABNORMAL HIGH (ref 70–99)

## 2022-05-11 LAB — COMPREHENSIVE METABOLIC PANEL
ALT: 320 U/L — ABNORMAL HIGH (ref 0–44)
AST: 217 U/L — ABNORMAL HIGH (ref 15–41)
Albumin: 2.6 g/dL — ABNORMAL LOW (ref 3.5–5.0)
Alkaline Phosphatase: 122 U/L (ref 38–126)
Anion gap: 9 (ref 5–15)
BUN: 15 mg/dL (ref 8–23)
CO2: 24 mmol/L (ref 22–32)
Calcium: 8.5 mg/dL — ABNORMAL LOW (ref 8.9–10.3)
Chloride: 102 mmol/L (ref 98–111)
Creatinine, Ser: 0.71 mg/dL (ref 0.44–1.00)
GFR, Estimated: >60 mL/min (ref >60)
Glucose, Bld: 210 mg/dL — ABNORMAL HIGH (ref 70–99)
Potassium: 3.6 mmol/L (ref 3.5–5.1)
Sodium: 135 mmol/L (ref 135–145)
Total Bilirubin: 1.3 mg/dL — ABNORMAL HIGH (ref 0.3–1.2)
Total Protein: 5.8 g/dL — ABNORMAL LOW (ref 6.5–8.1)

## 2022-05-11 LAB — APTT
aPTT: 74 seconds — ABNORMAL HIGH (ref 24–36)
aPTT: 55 seconds — ABNORMAL HIGH (ref 24–36)

## 2022-05-11 LAB — HEPARIN LEVEL (UNFRACTIONATED): Heparin Unfractionated: 0.8 IU/mL — ABNORMAL HIGH (ref 0.30–0.70)

## 2022-05-11 MED ORDER — APIXABAN 5 MG PO TABS
5.0000 mg | ORAL_TABLET | Freq: Two times a day (BID) | ORAL | Status: DC
  Administered 2022-05-11 – 2022-05-14 (×6): 5 mg via ORAL
  Filled 2022-05-11 (×6): qty 1

## 2022-05-11 MED ORDER — LISINOPRIL 20 MG PO TABS
20.0000 mg | ORAL_TABLET | Freq: Every day | ORAL | Status: DC
  Administered 2022-05-11 – 2022-05-14 (×4): 20 mg via ORAL
  Filled 2022-05-11 (×4): qty 1

## 2022-05-11 MED ORDER — HEPARIN BOLUS VIA INFUSION
900.0000 [IU] | Freq: Once | INTRAVENOUS | Status: AC
  Administered 2022-05-11: 900 [IU] via INTRAVENOUS
  Filled 2022-05-11: qty 900

## 2022-05-11 NOTE — Progress Notes (Signed)
Rounding Note    Patient Name: [name] Date of Encounter: 05/11/2022  [place] Cardiologist: Nelva Bush, MD   Subjective  Breathing is OK  Denies CP    Inpatient Medications    Scheduled Meds:  aspirin EC  81 mg Oral Daily   insulin aspart  0-15 Units Subcutaneous TID WC   insulin glargine-yfgn  10 Units Subcutaneous QHS   metoprolol succinate  25 mg Oral Daily   Continuous Infusions:  heparin 1,050 Units/hr (05/10/22 1925)   PRN Meds: acetaminophen, albuterol, nitroGLYCERIN, ondansetron (ZOFRAN) IV   Vital Signs    Vitals:   05/11/22 0019 05/11/22 0028 05/11/22 0429 05/11/22 0839  BP: (!) 186/74 (!) 162/80 (!) 175/76 (!) 149/62  Pulse: 76 72 72 69  Resp: '18 20 16 18  '$ Temp: 98.2 F (36.8 C)  98.2 F (36.8 C) 97.9 F (36.6 C)  TempSrc:      SpO2: 96% 97% 96% 95%  Weight:      Height:        Intake/Output Summary (Last 24 hours) at 05/11/2022 1136 Last data filed at 05/11/2022 0430 Gross per 24 hour  Intake 437.21 ml  Output 1000 ml  Net -562.79 ml      05/09/2022    3:48 PM 05/09/2022    8:57 AM 05/08/2022    6:30 AM  Last 3 Weights  Weight (lbs) 171 lb 4.8 oz 196 lb 3.4 oz 196 lb 3.2 oz  Weight (kg) 77.7 kg 89 kg 88.996 kg      Telemetry    SR with PACs    - Personally Reviewed  ECG    NO new  - Personally Reviewed  Physical Exam   GEN: No acute distress.   Neck: JVP normal  Cardiac: RRR, no murmur Respiratory: Clear to auscultation bilaterally. GI: Soft, nontender  MS: No edema;  Labs    High Sensitivity Troponin:   Recent Labs  Lab 05/08/22 0634 05/08/22 0852 05/08/22 1055 05/09/22 1240 05/09/22 1440  TROPONINIHS 12 20* 22* 964* 846*     Chemistry Recent Labs  Lab 05/08/22 0634 05/09/22 1240 05/10/22 0104 05/11/22 0313  NA 140 138 136 135  K 4.1 4.4 3.4* 3.7  CL 105 101 103 105  CO2 '26 24 24 '$ 21*  GLUCOSE 327* 411* 246* 172*  BUN 27* 27* 24* 15  CREATININE 0.94 1.08* 0.79 0.76  CALCIUM 9.4 9.5  8.7* 8.2*  MG  --   --  1.6*  --   PROT 6.7 7.5  --   --   ALBUMIN 3.5 3.5  --   --   AST 53* 331*  --   --   ALT 46* 310*  --   --   ALKPHOS 50 92  --   --   BILITOT 0.9 1.8*  --   --   GFRNONAA 58* 49* >60 >60  ANIONGAP '9 13 9 9    '$ Lipids  Recent Labs  Lab 05/10/22 0836  CHOL 198  TRIG 208*  HDL 38*  LDLCALC 118*  CHOLHDL 5.2    Hematology Recent Labs  Lab 05/09/22 1240 05/10/22 0104 05/11/22 0313  WBC 11.8* 7.8 6.6  RBC 4.23 3.70* 3.81*  HGB 12.5 10.9* 11.3*  HCT 38.6 32.6* 33.4*  MCV 91.3 88.1 87.7  MCH 29.6 29.5 29.7  MCHC 32.4 33.4 33.8  RDW 12.1 12.3 12.4  PLT 183 158 190   Thyroid No results for input(s): "TSH", "FREET4" in the last  168 hours.  BNPNo results for input(s): "BNP", "PROBNP" in the last 168 hours.  DDimer No results for input(s): "DDIMER" in the last 168 hours.   Radiology    ECHOCARDIOGRAM COMPLETE  Result Date: 05/10/2022    ECHOCARDIOGRAM REPORT   Patient Name:   ADILENY ZAVADA Date of Exam: 05/10/2022 Medical Rec #:  [id number]        Height:       60.0 in Accession #:    [id number]       Weight:       171.3 lb Date of Birth:  12-03-1931        BSA:          1.748 m Patient Age:    55 years         BP:           147/70 mmHg Patient Gender: F                HR:           78 bpm. Exam Location:  [facility] Procedure: 2D Echo and Intracardiac Opacification Agent Indications:    NSTEMI I21.4  History:        Patient has prior history of Echocardiogram examinations.  Sonographer:    Wayland Salinas RDCS Referring Phys: [id number] Jose Persia  Sonographer Comments: Technically difficult study due to poor echo windows. Image acquisition challenging due to patient body habitus. IMPRESSIONS  1. Left ventricular ejection fraction, by estimation, is 55 to 60%. The left ventricle has normal function. The left ventricle has no regional wall motion abnormalities. There is mild left ventricular hypertrophy. Left ventricular diastolic parameters are indeterminate.  2. Right  ventricular systolic function is normal. The right ventricular size is normal.  3. Right atrial size was mildly dilated.  4. The mitral valve is normal in structure. No evidence of mitral valve regurgitation.  5. The aortic valve was not well visualized. Aortic valve regurgitation is not visualized. No aortic stenosis is present.  6. The inferior vena cava is normal in size with greater than 50% respiratory variability, suggesting right atrial pressure of 3 mmHg. FINDINGS  Left Ventricle: Left ventricular ejection fraction, by estimation, is 55 to 60%. The left ventricle has normal function. The left ventricle has no regional wall motion abnormalities. Definity contrast agent was given IV to delineate the left ventricular  endocardial borders. The left ventricular internal cavity size was normal in size. There is mild left ventricular hypertrophy. Left ventricular diastolic parameters are indeterminate. Right Ventricle: The right ventricular size is normal. Right vetricular wall thickness was not assessed. Right ventricular systolic function is normal. Left Atrium: Left atrial size was normal in size. Right Atrium: Right atrial size was mildly dilated. Pericardium: There is no evidence of pericardial effusion. Mitral Valve: The mitral valve is normal in structure. No evidence of mitral valve regurgitation. Tricuspid Valve: The tricuspid valve is normal in structure. Tricuspid valve regurgitation is trivial. Aortic Valve: The aortic valve was not well visualized. Aortic valve regurgitation is not visualized. No aortic stenosis is present. Aortic valve peak gradient measures 5.7 mmHg. Pulmonic Valve: The pulmonic valve was not well visualized. Pulmonic valve regurgitation is not visualized. Venous: The inferior vena cava is normal in size with greater than 50% respiratory variability, suggesting right atrial pressure of 3 mmHg. IAS/Shunts: No atrial level shunt detected by color flow Doppler.  LEFT VENTRICLE PLAX 2D  LVIDd:         4.50 cm Diastology LVIDs:  3.10 cm LV e' medial:    5.00 cm/s LV PW:         1.20 cm LV E/e' medial:  10.4 LV IVS:        1.30 cm LV e' lateral:   6.85 cm/s                        LV E/e' lateral: 7.6  RIGHT VENTRICLE RV Basal diam:  3.70 cm RV S prime:     9.14 cm/s TAPSE (M-mode): 1.9 cm LEFT ATRIUM           Index        RIGHT ATRIUM           Index LA Vol (A4C): 49.0 ml 28.04 ml/m  RA Area:     19.60 cm                                    RA Volume:   64.40 ml  36.85 ml/m  AORTIC VALVE AV Vmax:      119.00 cm/s AV Peak Grad: 5.7 mmHg LVOT Vmax:    88.50 cm/s LVOT Vmean:   58.100 cm/s LVOT VTI:     0.187 m MITRAL VALVE MV Area (PHT): 1.60 cm     SHUNTS MV Decel Time: 474 msec     Systemic VTI: 0.19 m MV E velocity: 51.90 cm/s MV A velocity: 113.00 cm/s MV E/A ratio:  0.46 Dorris Carnes MD Electronically signed by Dorris Carnes MD Signature Date/Time: 05/10/2022/2:40:34 PM    Final    US Abdomen Limited RUQ (LIVER/GB)  Result Date: 05/10/2022 CLINICAL DATA:  Elevated liver function tests EXAM: ULTRASOUND ABDOMEN LIMITED RIGHT UPPER QUADRANT COMPARISON:  CT 05/08/2022 FINDINGS: Gallbladder: No gallstones or wall thickening visualized. No sonographic Murphy sign noted by sonographer. Common bile duct: Diameter: 3 mm Liver: Diffusely echogenic hepatic parenchyma consistent with fatty liver infiltration. There is an area of fatty sparing along the margin of the gallbladder fossa. Please correlate with prior contrast CT scan of 05/08/2022 portal vein is patent on color Doppler imaging with normal direction of blood flow towards the liver. Other: None. IMPRESSION: Fatty liver infiltration with areas of fatty sparing along the gallbladder fossa margin. No gallstones or ductal dilatation Electronically Signed   By: Jill Side M.D.   On: 05/10/2022 10:52   DG Chest 1 View  Result Date: 05/09/2022 CLINICAL DATA:  weakness / fall this am EXAM: CHEST  1 VIEW COMPARISON:  Chest x-ray very 11/28/2022.  FINDINGS: Lung volumes. Mild streaky bibasilar opacities. No confluent consolidation. No visible pneumothorax or pleural effusions. Vascular crowding due to low lung volumes. Cardiomediastinal silhouette is within normal limits for technique. IMPRESSION: Mild streaky bibasilar opacities likely represents atelectasis given low lung volumes. No confluent consolidation. Electronically Signed   By: Margaretha Sheffield M.D.   On: 05/09/2022 12:57   CT Head Wo Contrast  Result Date: 05/09/2022 CLINICAL DATA:  Fall EXAM: CT HEAD WITHOUT CONTRAST CT CERVICAL SPINE WITHOUT CONTRAST TECHNIQUE: Multidetector CT imaging of the head and cervical spine was performed following the standard protocol without intravenous contrast. Multiplanar CT image reconstructions of the cervical spine were also generated. RADIATION DOSE REDUCTION: This exam was performed according to the departmental dose-optimization program which includes automated exposure control, adjustment of the mA and/or kV according to patient size and/or use of iterative reconstruction technique. COMPARISON:  CT Head  12/28/17 FINDINGS: CT HEAD FINDINGS Brain: No evidence of acute infarction, hemorrhage, hydrocephalus, extra-axial collection or mass lesion/mass effect. Sequela of moderate to severe chronic microvascular ischemic change. Vascular: No hyperdense vessel or unexpected calcification. Skull: Mild soft tissue swelling over the left frontal scalp Sinuses/Orbits: No middle ear or mastoid effusion. Paranasal sinuses are clear. Right lens replacement. Orbits are otherwise unremarkable. Other: None. CT CERVICAL SPINE FINDINGS Alignment: Normal. Skull base and vertebrae: No acute fracture. No primary bone lesion or focal pathologic process. Postsurgical changes from prior right hemilaminectomy at C6. Soft tissues and spinal canal: No prevertebral fluid or swelling. No visible canal hematoma. Disc levels:  No evidence of high-grade spinal canal stenosis. Upper chest:  Negative. Other: None IMPRESSION: 1. No CT evidence of intracranial injury. 2. Mild soft tissue swelling over the left frontal scalp. 3. No acute fracture or traumatic malalignment of the cervical spine. Electronically Signed   By: Marin Roberts M.D.   On: 05/09/2022 12:56   CT Cervical Spine Wo Contrast  Result Date: 05/09/2022 CLINICAL DATA:  Fall EXAM: CT HEAD WITHOUT CONTRAST CT CERVICAL SPINE WITHOUT CONTRAST TECHNIQUE: Multidetector CT imaging of the head and cervical spine was performed following the standard protocol without intravenous contrast. Multiplanar CT image reconstructions of the cervical spine were also generated. RADIATION DOSE REDUCTION: This exam was performed according to the departmental dose-optimization program which includes automated exposure control, adjustment of the mA and/or kV according to patient size and/or use of iterative reconstruction technique. COMPARISON:  CT Head 12/28/17 FINDINGS: CT HEAD FINDINGS Brain: No evidence of acute infarction, hemorrhage, hydrocephalus, extra-axial collection or mass lesion/mass effect. Sequela of moderate to severe chronic microvascular ischemic change. Vascular: No hyperdense vessel or unexpected calcification. Skull: Mild soft tissue swelling over the left frontal scalp Sinuses/Orbits: No middle ear or mastoid effusion. Paranasal sinuses are clear. Right lens replacement. Orbits are otherwise unremarkable. Other: None. CT CERVICAL SPINE FINDINGS Alignment: Normal. Skull base and vertebrae: No acute fracture. No primary bone lesion or focal pathologic process. Postsurgical changes from prior right hemilaminectomy at C6. Soft tissues and spinal canal: No prevertebral fluid or swelling. No visible canal hematoma. Disc levels:  No evidence of high-grade spinal canal stenosis. Upper chest: Negative. Other: None IMPRESSION: 1. No CT evidence of intracranial injury. 2. Mild soft tissue swelling over the left frontal scalp. 3. No acute fracture or  traumatic malalignment of the cervical spine. Electronically Signed   By: Marin Roberts M.D.   On: 05/09/2022 12:56    Cardiac Studies   Echo  05/10/22  1. Left ventricular ejection fraction, by estimation, is 55 to 60%. The  left ventricle has normal function. The left ventricle has no regional  wall motion abnormalities. There is mild left ventricular hypertrophy.  Left ventricular diastolic parameters  are indeterminate.   2. Right ventricular systolic function is normal. The right ventricular  size is normal.   3. Right atrial size was mildly dilated.   4. The mitral valve is normal in structure. No evidence of mitral valve  regurgitation.   5. The aortic valve was not well visualized. Aortic valve regurgitation  is not visualized. No aortic stenosis is present.   6. The inferior vena cava is normal in size with greater than 50%  respiratory variability, suggesting right atrial pressure of 3 mmHg.    Patient Profile   CHANE GERRITS is a 89 y.o. female with a hx of paroxysmal atrial fibrillation, hypertension, hyperlipidemia, type 2 diabetes mellitus, and recurrent syncope  thought to be vasovagal in nature, who is being seen 05/09/2022 for the evaluation of elevated troponin at the request of Dr. Charleen Kirks.  Assessment & Plan    1 Elevated troponin  (20, 22, 964, 846)  CK normal   Pt has received almost 48 hours of heparin    SHe has been pain free Echo with normal LVEF and RVEF    Plan for medical Rx only   On 81 mg ASA now    REsume Eliquis after  heparin d/c'd   2 PAF    Currently in SR   REsume Eliquis later today    3  GI  Abnormal LFTs   RUQ USN shows fatty liver dz   No gallstones or ductal dilitation   Repeat CMET   4  Type 2 DM  A1C 8.3   Pt lives in assisted living   Daughter says she gets a lot of carbs     Need to work with facility to minimize high carb options   ([facility]) 5  LIpids  LDL 118  HDL 38  Trig 208    ON Crestor 20 now   WIll need to follow, check  LFTs    May need to back off  Get up out of bed   Try to ambulate GOal to get to rehab   For questions or updates, please contact [place] Please consult [email or web address] for contact info under        Signed, Dorris Carnes, MD  05/11/2022, 11:36 AM

## 2022-05-11 NOTE — Progress Notes (Signed)
[place] for Heparin  Indication: chest pain/ACS and afib   Allergies  Allergen Reactions   Morphine Sulfate Other (See Comments)    BP bottoms out   Penicillins Diarrhea    Has patient had a PCN reaction causing immediate rash, facial/tongue/throat swelling, SOB or lightheadedness with hypotension: Yes Has patient had a PCN reaction causing severe rash involving mucus membranes or skin necrosis: Yes Has patient had a PCN reaction that required hospitalization No Has patient had a PCN reaction occurring within the last 10 years: No If all of the above answers are "NO", then may proceed with Cephalosporin use.    Patient Measurements: Height: 5' (152.4 cm) Weight: 77.7 kg (171 lb 4.8 oz) IBW/kg (Calculated) : 45.5 Heparin Dosing Weight: 63.1  Vital Signs: Temp: 98.2 F (36.8 C) (03/03 0019) Temp Source: Oral (03/02 2047) BP: 162/80 (03/03 0028) Pulse Rate: 72 (03/03 0028)  Labs: Recent Labs    05/08/22 1055 05/09/22 1240 05/09/22 1440 05/09/22 1530 05/10/22 0104 05/10/22 0511 05/10/22 0837 05/10/22 1837 05/11/22 0313  HGB  --  12.5  --   --  10.9*  --   --   --  11.3*  HCT  --  38.6  --   --  32.6*  --   --   --  33.4*  PLT  --  183  --   --  158  --   --   --  190  APTT  --   --   --    < > 66*  --  52* 55* 74*  LABPROT  --  19.5*  --   --   --   --   --   --   --   INR  --  1.7*  --   --   --   --   --   --   --   HEPARINUNFRC  --   --   --    < >  --  >1.10* >1.10*  --  0.80*  CREATININE  --  1.08*  --   --  0.79  --   --   --  0.76  CKTOTAL  --   --   --   --   --   --  216  --   --   TROPONINIHS 22* 964* 846*  --   --   --   --   --   --    < > = values in this interval not displayed.     Estimated Creatinine Clearance: 44.8 mL/min (by C-G formula based on SCr of 0.76 mg/dL).   Medical History: Past Medical History:  Diagnosis Date   Acute pancreatitis 07/24/2021   Basal cell carcinoma 03/30/2008   left upper  arm/excision   Basal cell carcinoma 03/31/2008   left upper arm, right lower leg   Basal cell carcinoma 01/18/2009   left upper arm   Collagenous colitis    Diabetes mellitus without complication ([place])    Diverticulitis    Elevated LFTs 07/25/2021   Heart murmur    at birth, none since   History of echocardiogram    a. 11/2015: echo showing EF of 60-65% with no WMA. Grade 1 DD and mild MR noted.    Hyperlipidemia    Per [facility]   Hypertension    Intertrochanteric fracture of right femur ([place]) 06/11/2019   Lack of coordination    Per [facility]  Long term current use of anticoagulant    Per [facility]   Noninfective gastroenteritis and colitis, unspecified    Per [facility]   PAF (paroxysmal atrial fibrillation) ([place])    a. initially diagnosed in 08/2016 --> started on Eliquis   Squamous cell carcinoma of skin 05/16/2020   Left nasal tip anterior to ala, refer to [facility]   Syncope    a. initially occurring in Fall 2016 b. Hospitalized in 10/2015 and 11/2015 for recurrent episodes.     Medications:  Apixaban PTA  Assessment: Pharmacy consulted for heparin infusion dosing and monitoring for 89 yo female for NSTEMI/elevated troponin. Patient was brought to ED after a fall at facility. She has PMH A. Fib and was taking apixaban prior to admission. Last dose of apixaban reported 2/29 @ 2000. Not consistent with ACS per cards.   3/2 0837 HL > 1.1 aPTT 52 3/2 1837 aPTT 55 3/3 0313 aPTT 74,  HL 0.80  Goal of Therapy:  Heparin level 0.3-0.7 units/ml once heparin and aPTT are correlating  aPTT 66-102 seconds Monitor platelets by anticoagulation protocol: Yes   Plan:  3/3 @ 0313:  aPTT = 74,  HL = 0.80 - aPTT therapeutic X 1,  HL still elevated from Eliquis PTA - Will continue pt on current rate and recheck aPTT in 8 hrs. - Will recheck HL on 3/4 with AM labs.  - Switch to heparin level monitoring once aPTT and heparin       level are correlating. - Plan to continue heparin  for 48 hours.    Orene Desanctis, PharmD Clinical Pharmacist 05/11/2022 4:22 AM

## 2022-05-11 NOTE — Progress Notes (Addendum)
[place] for Heparin  Indication: chest pain/ACS and afib   Allergies  Allergen Reactions   Morphine Sulfate Other (See Comments)    BP bottoms out   Penicillins Diarrhea    Has patient had a PCN reaction causing immediate rash, facial/tongue/throat swelling, SOB or lightheadedness with hypotension: Yes Has patient had a PCN reaction causing severe rash involving mucus membranes or skin necrosis: Yes Has patient had a PCN reaction that required hospitalization No Has patient had a PCN reaction occurring within the last 10 years: No If all of the above answers are "NO", then may proceed with Cephalosporin use.    Patient Measurements: Height: 5' (152.4 cm) Weight: 77.7 kg (171 lb 4.8 oz) IBW/kg (Calculated) : 45.5 Heparin Dosing Weight: 63.1  Vital Signs: Temp: 97.9 F (36.6 C) (03/03 0839) BP: 149/62 (03/03 0839) Pulse Rate: 69 (03/03 0839)  Labs: Recent Labs    05/09/22 1240 05/09/22 1440 05/09/22 1530 05/10/22 0104 05/10/22 0511 05/10/22 0837 05/10/22 1837 05/11/22 0313  HGB 12.5  --   --  10.9*  --   --   --  11.3*  HCT 38.6  --   --  32.6*  --   --   --  33.4*  PLT 183  --   --  158  --   --   --  190  APTT  --   --    < > 66*  --  52* 55* 74*  LABPROT 19.5*  --   --   --   --   --   --   --   INR 1.7*  --   --   --   --   --   --   --   HEPARINUNFRC  --   --    < >  --  >1.10* >1.10*  --  0.80*  CREATININE 1.08*  --   --  0.79  --   --   --  0.76  CKTOTAL  --   --   --   --   --  216  --   --   TROPONINIHS 964* 846*  --   --   --   --   --   --    < > = values in this interval not displayed.     Estimated Creatinine Clearance: 44.8 mL/min (by C-G formula based on SCr of 0.76 mg/dL).   Medical History: Past Medical History:  Diagnosis Date   Acute pancreatitis 07/24/2021   Basal cell carcinoma 03/30/2008   left upper arm/excision   Basal cell carcinoma 03/31/2008   left upper arm, right lower leg   Basal cell  carcinoma 01/18/2009   left upper arm   Collagenous colitis    Diabetes mellitus without complication ([place])    Diverticulitis    Elevated LFTs 07/25/2021   Heart murmur    at birth, none since   History of echocardiogram    a. 11/2015: echo showing EF of 60-65% with no WMA. Grade 1 DD and mild MR noted.    Hyperlipidemia    Per [facility]   Hypertension    Intertrochanteric fracture of right femur ([place]) 06/11/2019   Lack of coordination    Per [facility] term current use of anticoagulant    Per [facility]   Noninfective gastroenteritis and colitis, unspecified    Per [facility]   PAF (paroxysmal atrial fibrillation) ([place])  a. initially diagnosed in 08/2016 --> started on Eliquis   Squamous cell carcinoma of skin 05/16/2020   Left nasal tip anterior to ala, refer to [facility]   Syncope    a. initially occurring in Fall 2016 b. Hospitalized in 10/2015 and 11/2015 for recurrent episodes.     Medications:  Apixaban PTA  Assessment: Pharmacy consulted for heparin infusion dosing and monitoring for 89 yo female for NSTEMI/elevated troponin. Patient was brought to ED after a fall at facility. She has PMH A. Fib and was taking apixaban prior to admission. Last dose of apixaban reported 2/29 @ 2000. Not consistent with ACS per cards.  3/1 1730 Heparin drip started 3/2 0837 HL > 1.1 aPTT 52 3/2 1837 aPTT 55 3/3 0313 aPTT 74,  HL 0.80 3/3 1059 aPTT 55,  Subtherapeutic  Goal of Therapy:  Heparin level 0.3-0.7 units/ml once heparin and aPTT are correlating  aPTT 66-102 seconds Monitor platelets by anticoagulation protocol: Yes   Plan:  Last dose of apixaban 2/29 at 2000 Bolus heparin 900 un x 1 and increase infusion rate to 1150 un/hr Check aPTT/Anti-Xa level in 8 hours and daily once consecutively therapeutic.  Titrate by aPTT's until lab correlation is noted, then titrate by anti-xa alone. Will recheck Anti-Xa level on 3/4 with AM labs Continue to monitor H&H and platelets  daily while on heparin gtt.  Will M. Ouida Sills, PharmD PGY-1 Pharmacy Resident 05/11/2022 11:42 AM

## 2022-05-11 NOTE — Evaluation (Signed)
Physical Therapy Evaluation Patient Details Name: LIMMIE IMM MRN: [id number] DOB: [birth year] Today's Date: 05/11/2022  History of Present Illness  Pt is an 89 y/o female admitted secondary to falling out of her bed overnight and found to have elevated troponins. Pt found to have a NSTEMI and acute on chronic pancreatitis. Imaging was negative for any acute injuries or abnormalities from the fall. PMH including but not limited to atrial fibrillation on Eliquis, uncontrolled type 2 diabetes, hypertension, hyperlipidemia, idiopathic pancreatitis, collagenous colitis, diverticulitis.   Clinical Impression  Pt presented supine in bed with HOB elevated, awake and willing to participate in therapy session. Pt's daughter was present throughout session. Prior to admission, pt was independent with ADLs and ambulated with use of a rollator. She lives at [facility] in their [place]. Pt and pt's daughter both interested in their short-term rehab prior to pt returning to her home in ALF. Pt's daughter reported that she was already in communication with the Case Manager at [facility] to begin this process. At the time of evaluation, pt required min A for bed mobility, CGA for transfers and min A to take several side steps at EOB with use of RW. Her HR remained stable, with expected elevation with activity (HR 68 bpm at rest and increasing to low 90's with standing and walking). Pt denied SOB, dizziness or CP. Pt would continue to benefit from skilled physical therapy services at this time while admitted and after d/c to address the below listed limitations in order to improve overall safety and independence with functional mobility.      Recommendations for follow up therapy are one component of a multi-disciplinary discharge planning process, led by the attending physician.  Recommendations may be updated based on patient status, additional functional criteria and insurance authorization.  Follow Up  Recommendations Skilled nursing-short term rehab (<3 hours/day) Can patient physically be transported by private vehicle: Yes    Assistance Recommended at Discharge Intermittent Supervision/Assistance  Patient can return home with the following  A little help with walking and/or transfers;A little help with bathing/dressing/bathroom;Assistance with cooking/housework;Direct supervision/assist for medications management;Assist for transportation    Equipment Recommendations None recommended by PT  Recommendations for Other Services       Functional Status Assessment Patient has had a recent decline in their functional status and demonstrates the ability to make significant improvements in function in a reasonable and predictable amount of time.     Precautions / Restrictions Precautions Precautions: Fall Restrictions Weight Bearing Restrictions: No      Mobility  Bed Mobility Overal bed mobility: Needs Assistance Bed Mobility: Supine to Sit, Sit to Supine     Supine to sit: Min assist Sit to supine: Min assist   General bed mobility comments: increased time and effort needed, assistance needed to scoot hips forward to EOB and for repositioning in bed at end of session    Transfers Overall transfer level: Needs assistance Equipment used: Rolling walker (2 wheels) Transfers: Sit to/from Stand Sit to Stand: Min guard           General transfer comment: pt steady with transitional movement, CGA for safety    Ambulation/Gait Ambulation/Gait assistance: Min assist   Assistive device: Rolling walker (2 wheels)         General Gait Details: pt able to take 4-5 side steps at EOB towards the [facility] with use of RW and min A for stability and safety  Stairs  Wheelchair Mobility    Modified Rankin (Stroke Patients Only)       Balance Overall balance assessment: Needs assistance Sitting-balance support: Feet supported Sitting balance-Leahy Scale:  Good     Standing balance support: During functional activity, Bilateral upper extremity supported Standing balance-Leahy Scale: Poor                               Pertinent Vitals/Pain Pain Assessment Pain Assessment: No/denies pain    Home Living Family/patient expects to be discharged to:: Assisted living                 Home Equipment: Rollator (4 wheels) Additional Comments: Pt lives at [facility]    Prior Function Prior Level of Function : Independent/Modified Independent             Mobility Comments: Mod Ind amb facility distances with a rollator ADLs Comments: Ind with ADLs, facility staff assists with meals and meds     Hand Dominance        Extremity/Trunk Assessment   Upper Extremity Assessment Upper Extremity Assessment: Overall WFL for tasks assessed    Lower Extremity Assessment Lower Extremity Assessment: Overall WFL for tasks assessed       Communication   Communication: HOH  Cognition Arousal/Alertness: Awake/alert Behavior During Therapy: WFL for tasks assessed/performed Overall Cognitive Status: Within Functional Limits for tasks assessed                                 General Comments: previously worked as an Therapist, 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 Comments      Exercises     Assessment/Plan    PT Assessment Patient needs continued PT services  PT Problem List Decreased strength;Decreased balance;Decreased mobility;Decreased coordination;Decreased knowledge of use of DME;Decreased safety awareness;Decreased knowledge of precautions;Cardiopulmonary status limiting activity       PT Treatment Interventions DME instruction;Gait training;Functional mobility training;Therapeutic activities;Therapeutic exercise;Balance training;Neuromuscular re-education;Patient/family education    PT Goals (Current goals can be found in the Care Plan section)  Acute Rehab PT Goals Patient Stated Goal: to get  stronger PT Goal Formulation: With patient/family Time For Goal Achievement: 05/25/22 Potential to Achieve Goals: Good    Frequency Min 2X/week     Co-evaluation               AM-PAC PT "6 Clicks" Mobility  Outcome Measure Help needed turning from your back to your side while in a flat bed without using bedrails?: A Little Help needed moving from lying on your back to sitting on the side of a flat bed without using bedrails?: A Little Help needed moving to and from a bed to a chair (including a wheelchair)?: A Little Help needed standing up from a chair using your arms (e.g., wheelchair or bedside chair)?: A Little Help needed to walk in hospital room?: A Little Help needed climbing 3-5 steps with a railing? : A Lot 6 Click Score: 17    End of Session Equipment Utilized During Treatment: Gait belt Activity Tolerance: Patient tolerated treatment well Patient left: in bed;with call bell/phone within reach;with bed alarm set;with family/visitor present;Other (comment) (MD present) Nurse Communication: Mobility status PT Visit Diagnosis: Other abnormalities of gait and mobility (R26.89)    Time: [id number] PT Time Calculation (min) (ACUTE ONLY): 22 min   Charges:  PT Evaluation $PT Eval Moderate Complexity: 1 Mod          Eduard Clos, PT, DPT  Acute Rehabilitation Services Office [place] 05/11/2022, 12:42 PM

## 2022-05-11 NOTE — Progress Notes (Signed)
Progress Note   [name] [id number] DOB: January 14, 1932 DOA: 05/09/2022     2 DOS: the patient was seen and examined on 05/11/2022   Subjective:  Patient seen and examined this morning.   Denies chest pain nausea vomiting or abdominal pain  Brief hospital course:   [name] is a 89 y.o. female with medical history significant of atrial fibrillation on Eliquis, uncontrolled type 2 diabetes, hypertension, hyperlipidemia, idiopathic pancreatitis, collagenous colitis, diverticulitis, who presents to the ED after a ground-level fall. [name] states that she must have rolled off her bed in her sleep and she does not remember the event happening.  She denies any symptoms at this time including chest pain, nausea, vomiting, abdominal pain, shortness of breath, palpitations.  Per patient's daughter, [name] was started on Macrobid approximately 2 weeks ago for a UTI.    ED course: On arrival to the ED, patient was normotensive at 132/67 with heart rate of 86.  She was saturating at 95% on room air.  She was afebrile at 98.8.  Initial workup remarkable for WBC of 11.8, hemoglobin 12.5, platelets of 183, potassium 4.4, BUN 27, creatinine 1.08, AST 331, ALT 310, total bilirubin of 1.8 and GFR 49.  CT of the head and C-spine were obtained that did not show any acute abnormalities with the exception of mild soft tissue swelling over the left frontal scalp.  Chest x-ray was obtained that demonstrated mild streaky bibasilar opacities likely representing atelectasis.    3/1 : Cardiology was consulted and the recommendation was to continue heparinizing the patient.  Evaluate with echo.  Patient does not want to undergo aggressive intervention with cardiac cath.   3/3 : Patient still requiring heparin will completed later this evening and hopefully switch to Eliquis after.   Assessment and Plan: NSTEMI (non-ST elevated myocardial infarction) [facility]) Patient presenting after rolling off  bed.  Her troponin on presentation elevated as high as 960, with downtrend.  No EKG changes and patient is chest pain-free (although patient is a poor historian).  However she has multiple risk factors including uncontrolled type 2 diabetes, uncontrolled hyperlipidemia, and hypertension.  Due to this, will initiate heparin infusion and discussed with cardiology.   - Cardiology on board recommendations appreciated - Continue heparin infusion per pharmacy dosing - Continue heparin 81 mg - Completed 48 hours of IV heparin drip and then we will initiate Eliquis - Echocardiogram done on 05/10/2022 showed ejection fraction of 55 to 60%   Acute on chronic pancreatitis  Patient has a history of acute pancreatitis in May 2023 that was suspected to be secondary to [place].  LFTs are markedly elevated today compared to 1 day prior with AST 331 and ALT 310.  In addition, lipase has trended up to 562 from 390 the day prior.  Given changes in LFTs and lipase and right upper quadrant pain on exam, most consistent with acute on chronic pancreatitis.  Interestingly, patient was recently restarted on Macrobid 2 weeks ago.   - Stop Macrobid; if no other etiology of pancreatitis can be identified, will need to add Macrobid to patient's allergy list - Right upper quadrant ultrasound pending - IV fluid resuscitation   Ground-level fall Per patient, she rolled off the bed when she was asleep, however she cannot recall the event.  No injuries noted on exam and CT head without any acute intracranial abnormalities. PT/OT consulted   Type 2 diabetes mellitus without complications ([place]) History of uncontrolled diabetes with CBG  as high as 411 on admission.  Last A1c approximately 3 weeks ago at 8.3% Currently holding oral hypoglycemic agents - Continue SSI, moderate - Continue Semglee 10 units at bedtime   Paroxysmal atrial fibrillation (HCC) - Continue home metoprolol - Hold home Eliquis given heparin infusion    Essential hypertension - Continue metoprolol and lisinopril   AKI (acute kidney injury) ([place]) Likely prerenal in the setting of NSTEMI and acute pancreatitis   - Repeat BMP in the a.m. - IV fluids as ordered - Hold home nephrotoxic agents - Bladder scan   COPD (chronic obstructive pulmonary disease) (HCC) - Continue home supplemental oxygen as needed - Continue home bronchodilators      Physical Exam HENT:     Head: Normocephalic.  Eyes:     Pupils: Pupils are equal, round, and reactive to light.  Cardiovascular:     Rate and Rhythm: Normal rate.  Pulmonary:     Effort: Pulmonary effort is normal.  Abdominal:     General: Abdomen is flat.     Palpations: Abdomen is soft.  Musculoskeletal:        General: Normal range of motion.     Cervical back: Normal range of motion.  Skin:    General: Skin is warm.  Neurological:     General: No focal deficit present.     Mental Status: She is alert.  Psychiatric:        Mood and Affect: Mood normal.        Behavior: Behavior normal.  Data Reviewed:   Results are pending, will review when available.   Family Communication: No family at bedside.   Disposition: Status is: Inpatient Remains inpatient appropriate because: NSTEMI requiring heparin infusion  Total time spent 35 minutes       Vitals:   05/11/22 0429 05/11/22 0839 05/11/22 1340 05/11/22 1448  BP: (!) 175/76 (!) 149/62 (!) 162/73 (!) 153/68  Pulse: 72 69 71 85  Resp: '16 18 18   '$ Temp: 98.2 F (36.8 C) 97.9 F (36.6 C) 97.7 F (36.5 C) 98.1 F (36.7 C)  TempSrc:   Oral Oral  SpO2: 96% 95% 96% 97%  Weight:      Height:        Author: Verline Lema, MD 05/11/2022 4:27 PM  For on call review [email or web address].

## 2022-05-12 DIAGNOSIS — I2489 Other forms of acute ischemic heart disease: Secondary | ICD-10-CM

## 2022-05-12 LAB — CBC WITH DIFFERENTIAL/PLATELET
Abs Immature Granulocytes: 0.12 10*3/uL — ABNORMAL HIGH (ref 0.00–0.07)
Basophils Absolute: 0 10*3/uL (ref 0.0–0.1)
Basophils Relative: 1 %
Eosinophils Absolute: 0.5 10*3/uL (ref 0.0–0.5)
Eosinophils Relative: 9 %
HCT: 34.9 % — ABNORMAL LOW (ref 36.0–46.0)
Hemoglobin: 11.4 g/dL — ABNORMAL LOW (ref 12.0–15.0)
Immature Granulocytes: 2 %
Lymphocytes Relative: 29 %
Lymphs Abs: 1.8 10*3/uL (ref 0.7–4.0)
MCH: 29.1 pg (ref 26.0–34.0)
MCHC: 32.7 g/dL (ref 30.0–36.0)
MCV: 89 fL (ref 80.0–100.0)
Monocytes Absolute: 0.7 10*3/uL (ref 0.1–1.0)
Monocytes Relative: 12 %
Neutro Abs: 3 10*3/uL (ref 1.7–7.7)
Neutrophils Relative %: 47 %
Platelets: 205 10*3/uL (ref 150–400)
RBC: 3.92 MIL/uL (ref 3.87–5.11)
RDW: 12.3 % (ref 11.5–15.5)
WBC: 6.1 10*3/uL (ref 4.0–10.5)
nRBC: 0 % (ref 0.0–0.2)

## 2022-05-12 LAB — COMPREHENSIVE METABOLIC PANEL
ALT: 308 U/L — ABNORMAL HIGH (ref 0–44)
AST: 162 U/L — ABNORMAL HIGH (ref 15–41)
Albumin: 2.5 g/dL — ABNORMAL LOW (ref 3.5–5.0)
Alkaline Phosphatase: 122 U/L (ref 38–126)
Anion gap: 10 (ref 5–15)
BUN: 13 mg/dL (ref 8–23)
CO2: 23 mmol/L (ref 22–32)
Calcium: 8.5 mg/dL — ABNORMAL LOW (ref 8.9–10.3)
Chloride: 104 mmol/L (ref 98–111)
Creatinine, Ser: 0.66 mg/dL (ref 0.44–1.00)
GFR, Estimated: >60 mL/min (ref >60)
Glucose, Bld: 205 mg/dL — ABNORMAL HIGH (ref 70–99)
Potassium: 3.6 mmol/L (ref 3.5–5.1)
Sodium: 137 mmol/L (ref 135–145)
Total Bilirubin: 1.2 mg/dL (ref 0.3–1.2)
Total Protein: 5.4 g/dL — ABNORMAL LOW (ref 6.5–8.1)

## 2022-05-12 LAB — GLUCOSE, CAPILLARY
Glucose-Capillary: 231 mg/dL — ABNORMAL HIGH (ref 70–99)
Glucose-Capillary: 209 mg/dL — ABNORMAL HIGH (ref 70–99)
Glucose-Capillary: 254 mg/dL — ABNORMAL HIGH (ref 70–99)
Glucose-Capillary: 181 mg/dL — ABNORMAL HIGH (ref 70–99)

## 2022-05-12 NOTE — Progress Notes (Signed)
Progress Note   [name] DOB: 04-13-1931 DOA: 05/09/2022     3 DOS: the patient was seen and examined on 05/12/2022   Subjective:  Patient seen and examined this morning.   Denies chest pain nausea vomiting or abdominal pain   Brief hospital course:   [name] is a 89 y.o. female with medical history significant of atrial fibrillation on Eliquis, uncontrolled type 2 diabetes, hypertension, hyperlipidemia, idiopathic pancreatitis, collagenous colitis, diverticulitis, who presents to the ED after a ground-level fall. [name] states that she must have rolled off her bed in her sleep and she does not remember the event happening.  She denies any symptoms at this time including chest pain, nausea, vomiting, abdominal pain, shortness of breath, palpitations.  Per patient's daughter, [name] was started on Macrobid approximately 2 weeks ago for a UTI.    ED course: On arrival to the ED, patient was normotensive at 132/67 with heart rate of 86.  She was saturating at 95% on room air.  She was afebrile at 98.8.  Initial workup remarkable for WBC of 11.8, hemoglobin 12.5, platelets of 183, potassium 4.4, BUN 27, creatinine 1.08, AST 331, ALT 310, total bilirubin of 1.8 and GFR 49.  CT of the head and C-spine were obtained that did not show any acute abnormalities with the exception of mild soft tissue swelling over the left frontal scalp.  Chest x-ray was obtained that demonstrated mild streaky bibasilar opacities likely representing atelectasis.    3/1 : Cardiology was consulted and the recommendation was to continue heparinizing the patient.  Evaluate with echo.  Patient does not want to undergo aggressive intervention with cardiac cath.    3/3 : Patient still requiring heparin will completed later this evening and hopefully switch to Eliquis after.  3/4: Patient will need acute rehab/skilled nursing facility pending approval   Assessment and Plan: NSTEMI  (non-ST elevated myocardial infarction) [facility]) Patient presenting after rolling off bed.  Her troponin on presentation elevated as high as 960, with downtrend.  No EKG changes and patient is chest pain-free (although patient is a poor historian).  However she has multiple risk factors including uncontrolled type 2 diabetes, uncontrolled hyperlipidemia, and hypertension.  Due to this, will initiate heparin infusion and discussed with cardiology.   - Cardiology on board recommendations appreciated - Currently off heparin drip and on Eliquis - Continue heparin 81 mg - Echocardiogram done on 05/10/2022 showed ejection fraction of 55 to 60% According to cardiologist service patient is cleared for discharge I have reached out to case manager and is still waiting on bed availability for acute rehab/skilled nursing facility   Acute on chronic pancreatitis  Patient has a history of acute pancreatitis in May 2023 that was suspected to be secondary to [place].  LFTs are markedly elevated today compared to 1 day prior with AST 331 and ALT 310.  In addition, lipase has trended up to 562 from 390 the day prior.  Given changes in LFTs and lipase and right upper quadrant pain on exam, most consistent with acute on chronic pancreatitis.  Interestingly, patient was recently restarted on Macrobid 2 weeks ago.   - Stop Macrobid; if no other etiology of pancreatitis can be identified, will need to add Macrobid to patient's allergy list - Right upper quadrant ultrasound pending - IV fluid resuscitation   Ground-level fall Per patient, she rolled off the bed when she was asleep, however she cannot recall the event.  No injuries  noted on exam and CT head without any acute intracranial abnormalities. PT/OT consulted   Type 2 diabetes mellitus without complications ([place]) History of uncontrolled diabetes with CBG as high as 411 on admission.  Last A1c approximately 3 weeks ago at 8.3% Currently holding oral hypoglycemic  agents - Continue SSI, moderate - Continue Semglee 10 units at bedtime   Paroxysmal atrial fibrillation (HCC) - Continue home metoprolol - Hold home Eliquis given heparin infusion   Essential hypertension - Continue metoprolol and lisinopril   AKI (acute kidney injury) ([place]) Likely prerenal in the setting of NSTEMI and acute pancreatitis   - Repeat BMP in the a.m. - IV fluids as ordered - Hold home nephrotoxic agents - Bladder scan   COPD (chronic obstructive pulmonary disease) (HCC) - Continue home supplemental oxygen as needed - Continue home bronchodilators       Physical Exam HENT:     Head: Normocephalic.  Eyes:     Pupils: Pupils are equal, round, and reactive to light.  Cardiovascular:     Rate and Rhythm: Normal rate.  Pulmonary:     Effort: Pulmonary effort is normal.  Abdominal:     General: Abdomen is flat.     Palpations: Abdomen is soft.  Musculoskeletal:        General: Normal range of motion.     Cervical back: Normal range of motion.  Skin:    General: Skin is warm.  Neurological:     General: No focal deficit present.     Mental Status: She is alert.  Psychiatric:        Mood and Affect: Mood normal.        Behavior: Behavior normal.   Data Reviewed:   CBC and BMP reviewed by me this morning   Family Communication: Discussed with patient's daughter at bedside   Disposition: Status is: Inpatient Remains inpatient appropriate because: NSTEMI requiring heparin infusion   Total time spent 36 minutes  Vitals:   05/12/22 0351 05/12/22 0748 05/12/22 1227 05/12/22 1623  BP: (!) 163/74 (!) 177/55 (!) 141/68 131/61  Pulse: 74 72 70 80  Resp: '16  18 16  '$ Temp: 97.9 F (36.6 C) (!) 97.4 F (36.3 C) 98.6 F (37 C) 98.6 F (37 C)  TempSrc:  Oral Oral Oral  SpO2: 97% 97% 97% 97%  Weight:      Height:        Author: Verline Lema, MD 05/12/2022 5:45 PM  For on call review [email or web address].

## 2022-05-12 NOTE — Progress Notes (Signed)
Mobility Specialist - Progress Note    05/12/22 1400  Mobility  Activity Ambulated with assistance in room;Stood at bedside;Transferred from bed to chair  Level of Assistance Contact guard assist, steadying assist  Assistive Device Front wheel walker  Distance Ambulated (ft) 6 ft  Activity Response Tolerated well  Mobility Referral Yes  $Mobility charge 1 Mobility   Pt resting in bed on RA upon entry. Pt STS and ambulates to recliner (3 step pivot transfet) CGA with AD. Pt left in recliner with needs in reach and chair alarm activated.   Loma Sender Mobility Specialist 05/12/22, 2:33 PM

## 2022-05-12 NOTE — TOC Progression Note (Signed)
Transition of Care [facility]) - Progression Note    Patient Details  Name: [name] MRN: [id number] Date of Birth: [birth year]  [facility] CM/SW [place], RN Phone Number: 05/12/2022, 2:49 PM  Clinical Narrative:    Spoke with [name] at [facility] patient can be accepted to facility when stable.        Expected Discharge Plan and Services                                               Social Determinants of Health (SDOH) Interventions SDOH Screenings   Food Insecurity: No Food Insecurity (04/25/2020)  Housing: Low Risk  (05/11/2022)  Transportation Needs: No Transportation Needs (05/11/2022)  Utilities: Not At Risk (05/11/2022)  Alcohol Screen: Low Risk  (10/19/2020)  Depression (PHQ2-9): Low Risk  (10/19/2020)  Financial Resource Strain: Low Risk  (04/25/2020)  Physical Activity: Inactive (04/25/2020)  Social Connections: Moderately Isolated (04/25/2020)  Stress: No Stress Concern Present (04/25/2020)  Tobacco Use: Medium Risk (05/09/2022)    Readmission Risk Interventions     No data to display

## 2022-05-12 NOTE — Care Management Important Message (Signed)
Important Message  Patient Details  Name: [name] MRN: [id number] Date of Birth: [birth year]   Medicare Important Message Given:  Yes     Loann Quill 05/12/2022, 4:14 PM

## 2022-05-12 NOTE — TOC Initial Note (Signed)
Transition of Care [facility]) - Initial/Assessment Note    Patient Details  Name: [name] MRN: [id number] Date of Birth: 1931-07-17  [facility] Care [facility]) CM/SW Contact:    Laurena Slimmer, RN Phone Number: 05/12/2022, 12:24 PM  Clinical Narrative:                 Patient is a resident of [facility]. Per therapy rehab recommended.  Attempt to reach [name] at [facility] regarding when bed would be available. No answer. Left a message.         Patient Goals and CMS Choice            Expected Discharge Plan and Services                                              Prior Living Arrangements/Services                       Activities of Daily Living      Permission Sought/Granted                  Emotional Assessment              Admission diagnosis:  Elevated troponin [R79.89] Abnormal laboratory test result [R89.9] NSTEMI (non-ST elevated myocardial infarction) [facility]) [I21.4] Patient Active Problem List   Diagnosis Date Noted   NSTEMI (non-ST elevated myocardial infarction) ([place]) 05/09/2022   Ground-level fall 05/09/2022   Acute on chronic pancreatitis ([place]) 05/09/2022   COPD (chronic obstructive pulmonary disease) ([place]) 04/19/2022   Type 2 diabetes mellitus without complications ([place]) [id number]   RSV infection 04/17/2022   Hyponatremia 04/17/2022   Aortic atherosclerosis ([place]) 09/05/2021   Dyslipidemia 07/25/2021   Essential hypertension 07/25/2021   AKI (acute kidney injury) ([place]) 07/25/2021   DNR (do not resuscitate) 10/19/2020   Pressure injury of skin 12/24/2019   Abnormal echocardiogram 10/12/2019   Hip pain 12/23/2018   Lower extremity edema 12/24/2016   Current use of long term anticoagulation 09/17/2016   Paroxysmal atrial fibrillation ([place]) 08/13/2016   Senile purpura ([place]) 01/01/2015   Arthritis 07/12/2014   Basal cell carcinoma 07/12/2014   CC (collagenous colitis) 07/12/2014   Obesity 07/12/2014    Acne erythematosa 07/12/2014   Diabetes mellitus with circulatory complication, without long-term current use of insulin ([place]) 07/12/2014   Avitaminosis D 07/12/2014   H/O malignant neoplasm of skin 10/10/2011   PCP:  Dewayne Shorter, MD Pharmacy:   [place], [place] - [address] [place] [address] Phone: [phone] Fax: [phone]     Social Determinants of Health (SDOH) Social History: SDOH Screenings   Food Insecurity: No Food Insecurity (04/25/2020)  Housing: Low Risk  (05/11/2022)  Transportation Needs: No Transportation Needs (05/11/2022)  Utilities: Not At Risk (05/11/2022)  Alcohol Screen: Low Risk  (10/19/2020)  Depression (PHQ2-9): Low Risk  (10/19/2020)  Financial Resource Strain: Low Risk  (04/25/2020)  Physical Activity: Inactive (04/25/2020)  Social Connections: Moderately Isolated (04/25/2020)  Stress: No Stress Concern Present (04/25/2020)  Tobacco Use: Medium Risk (05/09/2022)   SDOH Interventions: Housing Interventions: Intervention Not Indicated Transportation Interventions: Intervention Not Indicated Utilities Interventions: Intervention Not Indicated   Readmission Risk Interventions     No data to display

## 2022-05-12 NOTE — Progress Notes (Signed)
Rounding Note    Patient Name: CATALEIA NAWROT Date of Encounter: 05/12/2022  [place] Cardiologist: Nelva Bush, MD   Subjective   Patient denies chest pain. Breathing is stable. Overall not feeing too great.   Inpatient Medications    Scheduled Meds:  apixaban  5 mg Oral BID   aspirin EC  81 mg Oral Daily   insulin aspart  0-15 Units Subcutaneous TID WC   insulin glargine-yfgn  10 Units Subcutaneous QHS   lisinopril  20 mg Oral Daily   metoprolol succinate  25 mg Oral Daily   Continuous Infusions:  PRN Meds: acetaminophen, albuterol, nitroGLYCERIN, ondansetron (ZOFRAN) IV   Vital Signs    Vitals:   05/11/22 2147 05/11/22 2332 05/12/22 0351 05/12/22 0748  BP: (!) 157/63 (!) 142/92 (!) 163/74 (!) 177/55  Pulse: 71 80 74 72  Resp: '17 18 16   '$ Temp: 98.5 F (36.9 C) 98 F (36.7 C) 97.9 F (36.6 C) (!) 97.4 F (36.3 C)  TempSrc: Oral   Oral  SpO2: 98% 95% 97% 97%  Weight:      Height:        Intake/Output Summary (Last 24 hours) at 05/12/2022 1001 Last data filed at 05/12/2022 0757 Gross per 24 hour  Intake 120 ml  Output 1500 ml  Net -1380 ml      05/09/2022    3:48 PM 05/09/2022    8:57 AM 05/08/2022    6:30 AM  Last 3 Weights  Weight (lbs) 171 lb 4.8 oz 196 lb 3.4 oz 196 lb 3.2 oz  Weight (kg) 77.7 kg 89 kg 88.996 kg      Telemetry    SR PACs - Personally Reviewed  ECG    No new - Personally Reviewed  Physical Exam   GEN: No acute distress.   Neck: No JVD Cardiac: RRR, no murmurs, rubs, or gallops.  Respiratory: Clear to auscultation bilaterally. GI: Soft, nontender, non-distended  MS: No edema; No deformity. Neuro:  Nonfocal  Psych: Normal affect   Labs    High Sensitivity Troponin:   Recent Labs  Lab 05/08/22 0634 05/08/22 0852 05/08/22 1055 05/09/22 1240 05/09/22 1440  TROPONINIHS 12 20* 22* 964* 846*     Chemistry Recent Labs  Lab 05/09/22 1240 05/10/22 0104 05/11/22 0313 05/11/22 1059 05/12/22 0533   NA 138 136 135 135 137  K 4.4 3.4* 3.7 3.6 3.6  CL 101 103 105 102 104  CO2 24 24 21* 24 23  GLUCOSE 411* 246* 172* 210* 205*  BUN 27* 24* '15 15 13  '$ CREATININE 1.08* 0.79 0.76 0.71 0.66  CALCIUM 9.5 8.7* 8.2* 8.5* 8.5*  MG  --  1.6*  --   --   --   PROT 7.5  --   --  5.8* 5.4*  ALBUMIN 3.5  --   --  2.6* 2.5*  AST 331*  --   --  217* 162*  ALT 310*  --   --  320* 308*  ALKPHOS 92  --   --  122 122  BILITOT 1.8*  --   --  1.3* 1.2  GFRNONAA 49* >60 >60 >60 >60  ANIONGAP '13 9 9 9 10    '$ Lipids  Recent Labs  Lab 05/10/22 0836  CHOL 198  TRIG 208*  HDL 38*  LDLCALC 118*  CHOLHDL 5.2    Hematology Recent Labs  Lab 05/10/22 0104 05/11/22 0313 05/12/22 0533  WBC 7.8 6.6 6.1  RBC 3.70*  3.81* 3.92  HGB 10.9* 11.3* 11.4*  HCT 32.6* 33.4* 34.9*  MCV 88.1 87.7 89.0  MCH 29.5 29.7 29.1  MCHC 33.4 33.8 32.7  RDW 12.3 12.4 12.3  PLT 158 190 205   Thyroid No results for input(s): "TSH", "FREET4" in the last 168 hours.  BNPNo results for input(s): "BNP", "PROBNP" in the last 168 hours.  DDimer No results for input(s): "DDIMER" in the last 168 hours.   Radiology    ECHOCARDIOGRAM COMPLETE  Result Date: 05/10/2022    ECHOCARDIOGRAM REPORT   Patient Name:   TYLEA MCKINNEY Date of Exam: 05/10/2022 Medical Rec #:  [id number]        Height:       60.0 in Accession #:    [id number]       Weight:       171.3 lb Date of Birth:  November 15, 1931        BSA:          1.748 m Patient Age:    89 years         BP:           147/70 mmHg Patient Gender: F                HR:           78 bpm. Exam Location:  [facility] Procedure: 2D Echo and Intracardiac Opacification Agent Indications:    NSTEMI I21.4  History:        Patient has prior history of Echocardiogram examinations.  Sonographer:    Wayland Salinas RDCS Referring Phys: [id number] Jose Persia  Sonographer Comments: Technically difficult study due to poor echo windows. Image acquisition challenging due to patient body habitus. IMPRESSIONS  1. Left  ventricular ejection fraction, by estimation, is 55 to 60%. The left ventricle has normal function. The left ventricle has no regional wall motion abnormalities. There is mild left ventricular hypertrophy. Left ventricular diastolic parameters are indeterminate.  2. Right ventricular systolic function is normal. The right ventricular size is normal.  3. Right atrial size was mildly dilated.  4. The mitral valve is normal in structure. No evidence of mitral valve regurgitation.  5. The aortic valve was not well visualized. Aortic valve regurgitation is not visualized. No aortic stenosis is present.  6. The inferior vena cava is normal in size with greater than 50% respiratory variability, suggesting right atrial pressure of 3 mmHg. FINDINGS  Left Ventricle: Left ventricular ejection fraction, by estimation, is 55 to 60%. The left ventricle has normal function. The left ventricle has no regional wall motion abnormalities. Definity contrast agent was given IV to delineate the left ventricular  endocardial borders. The left ventricular internal cavity size was normal in size. There is mild left ventricular hypertrophy. Left ventricular diastolic parameters are indeterminate. Right Ventricle: The right ventricular size is normal. Right vetricular wall thickness was not assessed. Right ventricular systolic function is normal. Left Atrium: Left atrial size was normal in size. Right Atrium: Right atrial size was mildly dilated. Pericardium: There is no evidence of pericardial effusion. Mitral Valve: The mitral valve is normal in structure. No evidence of mitral valve regurgitation. Tricuspid Valve: The tricuspid valve is normal in structure. Tricuspid valve regurgitation is trivial. Aortic Valve: The aortic valve was not well visualized. Aortic valve regurgitation is not visualized. No aortic stenosis is present. Aortic valve peak gradient measures 5.7 mmHg. Pulmonic Valve: The pulmonic valve was not well visualized.  Pulmonic valve regurgitation is not visualized. Venous:  The inferior vena cava is normal in size with greater than 50% respiratory variability, suggesting right atrial pressure of 3 mmHg. IAS/Shunts: No atrial level shunt detected by color flow Doppler.  LEFT VENTRICLE PLAX 2D LVIDd:         4.50 cm Diastology LVIDs:         3.10 cm LV e' medial:    5.00 cm/s LV PW:         1.20 cm LV E/e' medial:  10.4 LV IVS:        1.30 cm LV e' lateral:   6.85 cm/s                        LV E/e' lateral: 7.6  RIGHT VENTRICLE RV Basal diam:  3.70 cm RV S prime:     9.14 cm/s TAPSE (M-mode): 1.9 cm LEFT ATRIUM           Index        RIGHT ATRIUM           Index LA Vol (A4C): 49.0 ml 28.04 ml/m  RA Area:     19.60 cm                                    RA Volume:   64.40 ml  36.85 ml/m  AORTIC VALVE AV Vmax:      119.00 cm/s AV Peak Grad: 5.7 mmHg LVOT Vmax:    88.50 cm/s LVOT Vmean:   58.100 cm/s LVOT VTI:     0.187 m MITRAL VALVE MV Area (PHT): 1.60 cm     SHUNTS MV Decel Time: 474 msec     Systemic VTI: 0.19 m MV E velocity: 51.90 cm/s MV A velocity: 113.00 cm/s MV E/A ratio:  0.46 Dorris Carnes MD Electronically signed by Dorris Carnes MD Signature Date/Time: 05/10/2022/2:40:34 PM    Final     Cardiac Studies   Echo 05/2022   1. Left ventricular ejection fraction, by estimation, is 55 to 60%. The  left ventricle has normal function. The left ventricle has no regional  wall motion abnormalities. There is mild left ventricular hypertrophy.  Left ventricular diastolic parameters  are indeterminate.   2. Right ventricular systolic function is normal. The right ventricular  size is normal.   3. Right atrial size was mildly dilated.   4. The mitral valve is normal in structure. No evidence of mitral valve  regurgitation.   5. The aortic valve was not well visualized. Aortic valve regurgitation  is not visualized. No aortic stenosis is present.   6. The inferior vena cava is normal in size with greater than 50%   respiratory variability, suggesting right atrial pressure of 3 mmHg.    Patient Profile     89 y.o. female with a hx of paroxysmal atrial fibrillation, hypertension, hyperlipidemia, type 2 diabetes mellitus, and recurrent syncope thought to be vasovagal in nature, who is being seen 05/09/2022 for the evaluation of elevated troponin   Assessment & Plan    Elevated troponin - HS trop peak 960 in the setting of acute pancreatitis, may be supply demand mismatch. No chest pain reported - IV heparin x 48 hours completed - Eliquis restarted - Echo showed LVEF 55-60% - no plan for invasive ischemic work-up - Crestor>held given abnormal LFTs  Abnormal Liver enzymes/Acute pancreatitis - numbers improving - Per IM - Statin held -  macrobid held  Paroxysmal Afib - continue Eliquis - metoprolol for rate control  For questions or updates, please contact [place] Please consult [email or web address] for contact info under        Signed, Gal Smolinski Ninfa Meeker, PA-C  05/12/2022, 10:01 AM

## 2022-05-13 LAB — COMPREHENSIVE METABOLIC PANEL
ALT: 219 U/L — ABNORMAL HIGH (ref 0–44)
AST: 67 U/L — ABNORMAL HIGH (ref 15–41)
Albumin: 2.6 g/dL — ABNORMAL LOW (ref 3.5–5.0)
Alkaline Phosphatase: 142 U/L — ABNORMAL HIGH (ref 38–126)
Anion gap: 6 (ref 5–15)
BUN: 14 mg/dL (ref 8–23)
CO2: 23 mmol/L (ref 22–32)
Calcium: 8.5 mg/dL — ABNORMAL LOW (ref 8.9–10.3)
Chloride: 105 mmol/L (ref 98–111)
Creatinine, Ser: 0.72 mg/dL (ref 0.44–1.00)
GFR, Estimated: >60 mL/min (ref >60)
Glucose, Bld: 203 mg/dL — ABNORMAL HIGH (ref 70–99)
Potassium: 3.7 mmol/L (ref 3.5–5.1)
Sodium: 134 mmol/L — ABNORMAL LOW (ref 135–145)
Total Bilirubin: 0.9 mg/dL (ref 0.3–1.2)
Total Protein: 5.5 g/dL — ABNORMAL LOW (ref 6.5–8.1)

## 2022-05-13 LAB — GLUCOSE, CAPILLARY
Glucose-Capillary: 211 mg/dL — ABNORMAL HIGH (ref 70–99)
Glucose-Capillary: 182 mg/dL — ABNORMAL HIGH (ref 70–99)
Glucose-Capillary: 297 mg/dL — ABNORMAL HIGH (ref 70–99)
Glucose-Capillary: 267 mg/dL — ABNORMAL HIGH (ref 70–99)

## 2022-05-13 LAB — CBC WITH DIFFERENTIAL/PLATELET
Abs Immature Granulocytes: 0.25 10*3/uL — ABNORMAL HIGH (ref 0.00–0.07)
Basophils Absolute: 0.1 10*3/uL (ref 0.0–0.1)
Basophils Relative: 1 %
Eosinophils Absolute: 0.7 10*3/uL — ABNORMAL HIGH (ref 0.0–0.5)
Eosinophils Relative: 9 %
HCT: 35.9 % — ABNORMAL LOW (ref 36.0–46.0)
Hemoglobin: 11.8 g/dL — ABNORMAL LOW (ref 12.0–15.0)
Immature Granulocytes: 3 %
Lymphocytes Relative: 33 %
Lymphs Abs: 2.7 10*3/uL (ref 0.7–4.0)
MCH: 29.5 pg (ref 26.0–34.0)
MCHC: 32.9 g/dL (ref 30.0–36.0)
MCV: 89.8 fL (ref 80.0–100.0)
Monocytes Absolute: 0.9 10*3/uL (ref 0.1–1.0)
Monocytes Relative: 11 %
Neutro Abs: 3.7 10*3/uL (ref 1.7–7.7)
Neutrophils Relative %: 43 %
Platelets: 223 10*3/uL (ref 150–400)
RBC: 4 MIL/uL (ref 3.87–5.11)
RDW: 12.4 % (ref 11.5–15.5)
WBC: 8.3 10*3/uL (ref 4.0–10.5)
nRBC: 0 % (ref 0.0–0.2)

## 2022-05-13 MED ORDER — MELATONIN 5 MG PO TABS
5.0000 mg | ORAL_TABLET | Freq: Once | ORAL | Status: AC
  Administered 2022-05-13: 5 mg via ORAL
  Filled 2022-05-13: qty 1

## 2022-05-13 NOTE — Progress Notes (Signed)
Progress Note   [name] [id number] DOB: November 19, 1931 DOA: 05/09/2022     4 DOS: the patient was seen and examined on 05/13/2022      Subjective:  Patient seen and examined this morning.   Denies chest pain nausea vomiting or abdominal pain   Brief hospital course:   [name] is a 89 y.o. female with medical history significant of atrial fibrillation on Eliquis, uncontrolled type 2 diabetes, hypertension, hyperlipidemia, idiopathic pancreatitis, collagenous colitis, diverticulitis, who presents to the ED after a ground-level fall. [name] states that she must have rolled off her bed in her sleep and she does not remember the event happening.  She denies any symptoms at this time including chest pain, nausea, vomiting, abdominal pain, shortness of breath, palpitations.  Per patient's daughter, [name] was started on Macrobid approximately 2 weeks ago for a UTI.  ED course: On arrival to the ED, patient was normotensive at 132/67 with heart rate of 86.  She was saturating at 95% on room air.  She was afebrile at 98.8.  Initial workup remarkable for WBC of 11.8, hemoglobin 12.5, platelets of 183, potassium 4.4, BUN 27, creatinine 1.08, AST 331, ALT 310, total bilirubin of 1.8 and GFR 49.  CT of the head and C-spine were obtained that did not show any acute abnormalities with the exception of mild soft tissue swelling over the left frontal scalp.  Chest x-ray was obtained that demonstrated mild streaky bibasilar opacities likely representing atelectasis.    On admission patient was seen by cardiologist who recommended medical therapy with IV heparin for 48 hours.  No cardiac intervention planned by cardiology team. Patient has been chest pain-free.  She was found to have mild transaminitis likely secondary to microbead as well as statin therapy, both of which are on hold at this time and liver function test showed improvement.  Liver ultrasound also obtained which  showed findings of fatty liver disease but no gallstones or ductal dilatation found. she was evaluated by physical therapist who recommended placement in a skilled nursing facility.  Case manager is working on Insurance underwriter prior authorization and subsequent placement.  Patient is cleared by cardiologist for discharge at this time and to follow-up as an outpatient.   Assessment and Plan: NSTEMI (non-ST elevated myocardial infarction) [name]) Patient presenting after rolling off bed.  Her troponin on presentation elevated as high as 960, with downtrend.  No EKG changes and patient is chest pain-free (although patient is a poor historian).  However she has multiple risk factors including uncontrolled type 2 diabetes, uncontrolled hyperlipidemia, and hypertension.  Due to this, will initiate heparin infusion and discussed with cardiology. Cardiologist on board and cleared patient for discharge when skilled nursing facility bed is ready Patient completed 48 hours of heparin drip and currently on her home dose of Eliquis Echocardiogram done on 05/10/2022 showed ejection fraction of 55 to 60% Patient currently pending insurance authorization and subsequent placement Continue metoprolol   Acute on chronic pancreatitis  Patient has a history of acute pancreatitis in May 2023 that was suspected to be secondary to [place].  LFTs were elevated on presentation.statin therapy and Macrobid have been discontinued . LFTs currently improving   ground-level fall Per patient, she rolled off the bed when she was asleep, however she cannot recall the event.  No injuries noted on exam and CT head without any acute intracranial abnormalities. PT/OT consulted and have recommended skilled nurse facility placement   Type 2 diabetes mellitus  without complications ([name]) History of uncontrolled diabetes with CBG as high as 411 on admission.  Last A1c approximately 3 weeks ago at 8.3% Currently holding oral hypoglycemic agents -  Continue SSI, moderate - Continue Semglee   Paroxysmal atrial fibrillation (HCC) - Continue home metoprolol - Hold home Eliquis given heparin infusion   Essential hypertension - Continue metoprolol and lisinopril   AKI (acute kidney injury) (HCC) Likely prerenal in the setting of NSTEMI and acute pancreatitis Resolved  COPD (chronic obstructive pulmonary disease) (HCC) - Continue home supplemental oxygen as needed - Continue home bronchodilators       Physical Exam HENT:     Head: Normocephalic.  Eyes:     Pupils: Pupils are equal, round, and reactive to light.  Cardiovascular:     Rate and Rhythm: Normal rate.  Pulmonary:     Effort: Pulmonary effort is normal.  Abdominal:     General: Abdomen is flat.     Palpations: Abdomen is soft.  Musculoskeletal:        General: Normal range of motion.     Cervical back: Normal range of motion.  Skin:    General: Skin is warm.  Neurological:     General: No focal deficit present.     Mental Status: She is alert.  Psychiatric:        Mood and Affect: Mood normal.        Behavior: Behavior normal.   Data Reviewed:   CBC and BMP reviewed by me this morning   Family Communication: Discussed with patient's daughter at bedside   Disposition: Status is: Inpatient Remains inpatient appropriate because: Pending authorization and subsequent placement  I spent a total of 35 minutes taking care of this patient   Vitals:   05/13/22 0018 05/13/22 0403 05/13/22 0740 05/13/22 1109  BP: (!) 148/68 (!) 159/60 (!) 159/77 136/61  Pulse: 76 81 74 72  Resp: '17 17 17 18  '$ Temp: 98.4 F (36.9 C) 98.3 F (36.8 C) 98.2 F (36.8 C) 98.1 F (36.7 C)  TempSrc: Oral   Oral  SpO2: 97% 97% 96% 98%  Weight:      Height:        Author: Verline Lema, MD 05/13/2022 12:36 PM  For on call review [email or web address].

## 2022-05-13 NOTE — TOC Progression Note (Addendum)
Transition of Care [facility]) - Progression Note    Patient Details  Name: [name] MRN: [id number] Date of Birth: 24-May-1931  [facility] of Care [facility] CM/SW Contact  Laurena Slimmer, RN Phone Number: 05/13/2022, 12:13 PM  Clinical Narrative:    Patient has a bed offer for [facility] but would require an [place]. Therapy notes from past 24 hours requested prior to submitting auth.  MD and physical therapist  notified.  4:13pm Candace Cruise started.        Expected Discharge Plan and Services         Expected Discharge Date: 05/13/22                                     Social Determinants of Health (SDOH) Interventions SDOH Screenings   Food Insecurity: No Food Insecurity (04/25/2020)  Housing: Low Risk  (05/11/2022)  Transportation Needs: No Transportation Needs (05/11/2022)  Utilities: Not At Risk (05/11/2022)  Alcohol Screen: Low Risk  (10/19/2020)  Depression (PHQ2-9): Low Risk  (10/19/2020)  Financial Resource Strain: Low Risk  (04/25/2020)  Physical Activity: Inactive (04/25/2020)  Social Connections: Moderately Isolated (04/25/2020)  Stress: No Stress Concern Present (04/25/2020)  Tobacco Use: Medium Risk (05/09/2022)    Readmission Risk Interventions     No data to display

## 2022-05-13 NOTE — Inpatient Diabetes Management (Signed)
Inpatient Diabetes Program Recommendations  AACE/ADA: New Consensus Statement on Inpatient Glycemic Control   Target Ranges:  Prepandial:   less than 140 mg/dL      Peak postprandial:   less than 180 mg/dL (1-2 hours)      Critically ill patients:  140 - 180 mg/dL    Latest Reference Range & Units 05/12/22 08:56 05/12/22 12:46 05/12/22 17:49 05/12/22 21:16 05/13/22 07:42  Glucose-Capillary 70 - 99 mg/dL 231 (H) 209 (H) 254 (H) 181 (H) 211 (H)   Review of Glycemic Control  Diabetes history: DM2 Outpatient Diabetes medications: Tradjenta 5 mg daily, Metformin 500 mg BID, Glipizide 10 mg BID Current orders for Inpatient glycemic control: Semglee 10 units QHS, Novolog 0-15 units TID with meals  Inpatient Diabetes Program Recommendations:    Insulin: Please consider increasing Semglee to 12 units QHS and ordering Novolog 3 units TID with meals for meal coverage if patient eats at least 50% of meals.  Thanks, Barnie Alderman, RN, MSN, [place] Diabetes Coordinator Inpatient Diabetes Program [phone] (Team Pager from 8am to Anon Raices)

## 2022-05-13 NOTE — Progress Notes (Signed)
Physical Therapy Treatment Patient Details Name: [name] MRN: [id number] DOB: Jun 10, 1931 Today's Date: 05/13/2022   History of Present Illness Pt is an 89 y/o female admitted secondary to falling out of her bed overnight and found to have elevated troponins. Pt found to have a NSTEMI and acute on chronic pancreatitis. Imaging was negative for any acute injuries or abnormalities from the fall. PMH including but not limited to atrial fibrillation on Eliquis, uncontrolled type 2 diabetes, hypertension, hyperlipidemia, idiopathic pancreatitis, collagenous colitis, diverticulitis.    PT Comments    Pt pleasant and eager to work with PT, ultimately she was pleased to be able to ambulate more than just a few steps this session.  She did not need a lot of direct assist apart from adjustments but is not feeling close to her baseline up ad lib, active self.  Pt did have some fatigue with the >50 ft bout of ambulation, though vitals did remain stable and she had only mild unsteadiness and no LOBs.  Pt making good improvements, will benefit from continued PT to get back to PLOF and baseline independence.    Recommendations for follow up therapy are one component of a multi-disciplinary discharge planning process, led by the attending physician.  Recommendations may be updated based on patient status, additional functional criteria and insurance authorization.  Follow Up Recommendations  Skilled nursing-short term rehab (<3 hours/day) Can patient physically be transported by private vehicle: Yes   Assistance Recommended at Discharge Intermittent Supervision/Assistance  Patient can return home with the following A little help with walking and/or transfers;A little help with bathing/dressing/bathroom;Assistance with cooking/housework;Direct supervision/assist for medications management;Assist for transportation   Equipment Recommendations  None recommended by PT    Recommendations for Other Services        Precautions / Restrictions Precautions Precautions: Fall Restrictions Weight Bearing Restrictions: No     Mobility  Bed Mobility Overal bed mobility: Needs Assistance Bed Mobility: Supine to Sit     Supine to sit: Min guard     General bed mobility comments: increased time, rail use, assistance needed to scoot hips forward to EOB to get feet to the floor.    Transfers Overall transfer level: Needs assistance Equipment used: Rolling walker (2 wheels) Transfers: Sit to/from Stand Sit to Stand: Min guard           General transfer comment: Pt needed significant forward lean and UE use, did not need direct assist    Ambulation/Gait Ambulation/Gait assistance: Min assist Gait Distance (Feet): 55 Feet Assistive device: Rollator (4 wheels)         General Gait Details: Pt with initial slow and hesitant cadence that did improve with increased ambulation.  Pt did not need excessive UE use on her basleine 4WW, but did appear to need greater than expected UE use through the AD.  Only mild increased HR and decreased O2 with the effort.   Stairs             Wheelchair Mobility    Modified Rankin (Stroke Patients Only)       Balance Overall balance assessment: Needs assistance Sitting-balance support: Feet supported Sitting balance-Leahy Scale: Good     Standing balance support: Bilateral upper extremity supported, Reliant on assistive device for balance Standing balance-Leahy Scale: Fair                              Cognition Arousal/Alertness: Awake/alert Behavior During Therapy: [facility]  for tasks assessed/performed Overall Cognitive Status: Within Functional Limits for tasks assessed                                          Exercises      General Comments General comments (skin integrity, edema, etc.): Pt pleasant and motivated with PT      Pertinent Vitals/Pain Pain Assessment Pain Assessment: 0-10 Pain Score:  3  Pain Location: R ankle    Home Living                          Prior Function            PT Goals (current goals can now be found in the care plan section) Progress towards PT goals: Progressing toward goals    Frequency    Min 2X/week      PT Plan Current plan remains appropriate    Co-evaluation              AM-PAC PT "6 Clicks" Mobility   Outcome Measure  Help needed turning from your back to your side while in a flat bed without using bedrails?: None Help needed moving from lying on your back to sitting on the side of a flat bed without using bedrails?: A Little Help needed moving to and from a bed to a chair (including a wheelchair)?: A Little Help needed standing up from a chair using your arms (e.g., wheelchair or bedside chair)?: A Little Help needed to walk in hospital room?: A Little Help needed climbing 3-5 steps with a railing? : A Lot 6 Click Score: 18    End of Session Equipment Utilized During Treatment: Gait belt Activity Tolerance: Patient tolerated treatment well Patient left: with chair alarm set;with call bell/phone within reach Nurse Communication: Mobility status PT Visit Diagnosis: Other abnormalities of gait and mobility (R26.89)     Time: 1400-1419 PT Time Calculation (min) (ACUTE ONLY): 19 min  Charges:  $Gait Training: 8-22 mins $Therapeutic Activity: 8-22 mins                     Kreg Shropshire, DPT 05/13/2022, 2:33 PM

## 2022-05-14 ENCOUNTER — Telehealth: Payer: Self-pay | Admitting: Student

## 2022-05-14 LAB — COMPREHENSIVE METABOLIC PANEL
ALT: 169 U/L — ABNORMAL HIGH (ref 0–44)
AST: 40 U/L (ref 15–41)
Albumin: 2.6 g/dL — ABNORMAL LOW (ref 3.5–5.0)
Alkaline Phosphatase: 136 U/L — ABNORMAL HIGH (ref 38–126)
Anion gap: 9 (ref 5–15)
BUN: 19 mg/dL (ref 8–23)
CO2: 24 mmol/L (ref 22–32)
Calcium: 8.6 mg/dL — ABNORMAL LOW (ref 8.9–10.3)
Chloride: 104 mmol/L (ref 98–111)
Creatinine, Ser: 0.78 mg/dL (ref 0.44–1.00)
GFR, Estimated: >60 mL/min (ref >60)
Glucose, Bld: 231 mg/dL — ABNORMAL HIGH (ref 70–99)
Potassium: 3.7 mmol/L (ref 3.5–5.1)
Sodium: 137 mmol/L (ref 135–145)
Total Bilirubin: 0.6 mg/dL (ref 0.3–1.2)
Total Protein: 5.7 g/dL — ABNORMAL LOW (ref 6.5–8.1)

## 2022-05-14 LAB — CBC WITH DIFFERENTIAL/PLATELET
Abs Immature Granulocytes: 0.31 10*3/uL — ABNORMAL HIGH (ref 0.00–0.07)
Basophils Absolute: 0.1 10*3/uL (ref 0.0–0.1)
Basophils Relative: 1 %
Eosinophils Absolute: 0.8 10*3/uL — ABNORMAL HIGH (ref 0.0–0.5)
Eosinophils Relative: 9 %
HCT: 34.8 % — ABNORMAL LOW (ref 36.0–46.0)
Hemoglobin: 11.5 g/dL — ABNORMAL LOW (ref 12.0–15.0)
Immature Granulocytes: 4 %
Lymphocytes Relative: 39 %
Lymphs Abs: 3.2 10*3/uL (ref 0.7–4.0)
MCH: 29.2 pg (ref 26.0–34.0)
MCHC: 33 g/dL (ref 30.0–36.0)
MCV: 88.3 fL (ref 80.0–100.0)
Monocytes Absolute: 0.8 10*3/uL (ref 0.1–1.0)
Monocytes Relative: 10 %
Neutro Abs: 3.1 10*3/uL (ref 1.7–7.7)
Neutrophils Relative %: 37 %
Platelets: 241 10*3/uL (ref 150–400)
RBC: 3.94 MIL/uL (ref 3.87–5.11)
RDW: 12.7 % (ref 11.5–15.5)
WBC: 8.3 10*3/uL (ref 4.0–10.5)
nRBC: 0 % (ref 0.0–0.2)

## 2022-05-14 LAB — GLUCOSE, CAPILLARY
Glucose-Capillary: 229 mg/dL — ABNORMAL HIGH (ref 70–99)
Glucose-Capillary: 216 mg/dL — ABNORMAL HIGH (ref 70–99)

## 2022-05-14 NOTE — Telephone Encounter (Signed)
Received call for confirmation of medications for admission.

## 2022-05-14 NOTE — TOC Progression Note (Addendum)
Transition of Care [facility]) - Progression Note    Patient Details  Name: HUMA IMPELLIZZERI MRN: [id number] Date of Birth: 05/24/1931  [facility]) CM/SW Contact  Laurena Slimmer, RN Phone Number: 05/14/2022, 1:27 PM  Clinical Narrative:    Josem Kaufmann approved for SNF at [facility] per [place] portal [id number] 05/14/2022-05/16/2022 Left message fro Seth Bake in admissions at [facility]  MD notified.        Expected Discharge Plan and Services         Expected Discharge Date: 05/13/22                                     Social Determinants of Health (SDOH) Interventions SDOH Screenings   Food Insecurity: No Food Insecurity (04/25/2020)  Housing: Low Risk  (05/11/2022)  Transportation Needs: No Transportation Needs (05/11/2022)  Utilities: Not At Risk (05/11/2022)  Alcohol Screen: Low Risk  (10/19/2020)  Depression (PHQ2-9): Low Risk  (10/19/2020)  Financial Resource Strain: Low Risk  (04/25/2020)  Physical Activity: Inactive (04/25/2020)  Social Connections: Moderately Isolated (04/25/2020)  Stress: No Stress Concern Present (04/25/2020)  Tobacco Use: Medium Risk (05/09/2022)    Readmission Risk Interventions     No data to display

## 2022-05-14 NOTE — NC FL2 (Signed)
[place] LEVEL OF CARE FORM     IDENTIFICATION  Patient Name: [name] Birthdate: 10-03-1931 Sex: female Admission Date (Current Location): 05/09/2022  [facility] and [place] Number:  Engineering geologist and Address:  [facility], [address], [place], [place] [address]      Provider Number: [id number]  Attending Physician Name and Address:  Emeterio Reeve, DO  Relative Name and Phone Number:  Polo Riley (Daughter) [phone] (    Current Level of Care: Hospital Recommended Level of Care: [place] Prior Approval Number:    Date Approved/Denied:   PASRR Number: [id number]  Discharge Plan: SNF    Current Diagnoses: Patient Active Problem List   Diagnosis Date Noted   NSTEMI (non-ST elevated myocardial infarction) ([place]) 05/09/2022   Ground-level fall 05/09/2022   Acute on chronic pancreatitis ([place]) 05/09/2022   COPD (chronic obstructive pulmonary disease) ([place]) 04/19/2022   Type 2 diabetes mellitus without complications ([place]) [id number]   RSV infection 04/17/2022   Hyponatremia 04/17/2022   Aortic atherosclerosis ([place]) 09/05/2021   Dyslipidemia 07/25/2021   Essential hypertension 07/25/2021   AKI (acute kidney injury) ([place]) 07/25/2021   DNR (do not resuscitate) 10/19/2020   Pressure injury of skin 12/24/2019   Abnormal echocardiogram 10/12/2019   Hip pain 12/23/2018   Lower extremity edema 12/24/2016   Current use of long term anticoagulation 09/17/2016   Paroxysmal atrial fibrillation ([place]) 08/13/2016   Senile purpura ([place]) 01/01/2015   Arthritis 07/12/2014   Basal cell carcinoma 07/12/2014   CC (collagenous colitis) 07/12/2014   Obesity 07/12/2014   Acne erythematosa 07/12/2014   Diabetes mellitus with circulatory complication, without long-term current use of insulin ([place]) 07/12/2014   Avitaminosis D 07/12/2014   H/O malignant neoplasm of skin 10/10/2011    Orientation RESPIRATION  BLADDER Height & Weight     Self, Time, Situation, Place  Normal External catheter Weight: 77.7 kg Height:  5' (152.4 cm)  BEHAVIORAL SYMPTOMS/MOOD NEUROLOGICAL BOWEL NUTRITION STATUS  Other (Comment) (n/a)  (n/a) Continent Diet (Heart)  AMBULATORY STATUS COMMUNICATION OF NEEDS Skin   Limited Assist Verbally Bruising (abd)                       Personal Care Assistance Level of Assistance  Bathing, Dressing Bathing Assistance: Limited assistance Feeding assistance: Limited assistance Dressing Assistance: Limited assistance     Functional Limitations Info    Sight Info: Impaired Hearing Info: Impaired Speech Info: Adequate    SPECIAL CARE FACTORS FREQUENCY  PT (By licensed PT)     PT Frequency: Min 2x weekly              Contractures Contractures Info: Not present    Additional Factors Info  Code Status Code Status Info: DNR Allergies Info: Morphine Sulfate, Penicillins           Current Medications (05/14/2022):  This is the current hospital active medication list Current Facility-Administered Medications  Medication Dose Route Frequency Provider Last Rate Last Admin   acetaminophen (TYLENOL) tablet 650 mg  650 mg Oral Q4H PRN Marguerita Merles T, MD   650 mg at 05/11/22 2229   albuterol (PROVENTIL) (2.5 MG/3ML) 0.083% nebulizer solution 3 mL  3 mL Inhalation Q6H PRN Verline Lema, MD       apixaban [place] [place]) tablet 5 mg  5 mg Oral BID Marguerita Merles T, MD   5 mg at 05/14/22 1027   insulin aspart (novoLOG) injection 0-15 Units  0-15 Units Subcutaneous TID WC Verline Lema, MD   5 Units at 05/14/22 1228   insulin glargine-yfgn (SEMGLEE) injection 10 Units  10 Units Subcutaneous QHS Marguerita Merles T, MD   10 Units at 05/13/22 2105   lisinopril (ZESTRIL) tablet 20 mg  20 mg Oral Daily Marguerita Merles T, MD   20 mg at 05/14/22 1027   metoprolol succinate (TOPROL-XL) 24 hr tablet 25 mg  25 mg Oral Daily Marguerita Merles T, MD   25 mg at 05/14/22 1027   nitroGLYCERIN  (NITROSTAT) SL tablet 0.4 mg  0.4 mg Sublingual Q5 Min x 3 PRN Verline Lema, MD       ondansetron [facility]) injection 4 mg  4 mg Intravenous Q6H PRN Verline Lema, MD   4 mg at 05/11/22 [id number]     Discharge Medications: Please see discharge summary for a list of discharge medications.  Relevant Imaging Results:  Relevant Lab Results:   Additional Information SS- [id number]  Laurena Slimmer, RN

## 2022-05-14 NOTE — Discharge Summary (Signed)
Physician Discharge Summary   [name] MRN: [id number]  DOB: 28-Jul-1931   Admit:     Date of Admission: 05/09/2022 Admitted from: home   Discharge: Date of discharge: 05/14/22 Disposition: Skilled nursing facility Condition at discharge: good  CODE STATUS: DNR     Discharge Physician: Emeterio Reeve, DO [facility]     PCP: Dewayne Shorter, MD  Recommendations for Outpatient Follow-up:  Follow up with PCP Dewayne Shorter, MD in 2 weeks Please obtain labs/tests: CMP, CBC in 1 week Please follow up on the following pending results: none PCP AND OTHER OUTPATIENT PROVIDERS: SEE BELOW FOR SPECIFIC DISCHARGE INSTRUCTIONS PRINTED FOR PATIENT IN ADDITION TO GENERIC AVS PATIENT INFO     Discharge Instructions     Diet - low sodium heart healthy   Complete by: As directed    Discharge instructions   Complete by: As directed    Continue to work with physical therapy and Occupational Therapy at the rehab. We have discontinued your cholesterol medication because of slight increase in your liver function tests. Follow-up with cardiologist as well as your primary care physician   Increase activity slowly   Complete by: As directed          Discharge Diagnoses: Principal Problem:   NSTEMI (non-ST elevated myocardial infarction) ([place]) Active Problems:   Acute on chronic pancreatitis ([place])   Ground-level fall   Type 2 diabetes mellitus without complications ([place])   Paroxysmal atrial fibrillation (HCC)   Essential hypertension   AKI (acute kidney injury) ([place])   COPD (chronic obstructive pulmonary disease) [facility])       Hospital Course: See A/P  Consultants:  Cardiology  Procedures: none      ASSESSMENT & PLAN w/ [place] COURSE:   NSTEMI (non-ST elevated myocardial infarction) [facility]) Patient presenting after rolling off bed.  Her troponin on presentation elevated as high as 960, with downtrend.  No EKG changes  and patient is chest pain-free (although patient is a poor historian).  However she has multiple risk factors including uncontrolled type 2 diabetes, uncontrolled hyperlipidemia, and hypertension.  Due to this, was initiated on heparin infusion and discussed with cardiology. Patient completed 48 hours of heparin drip and currently on her home dose of Eliquis. Cardiologist has cleared patient for discharge when skilled nursing facility bed is ready. Echocardiogram done on 05/10/2022 showed ejection fraction of 55 to 60% Continue metoprolol, lisinopril holding statin d/t elevated LFT pt is on Eliquis so will not add ASA   Acute on chronic pancreatitis  Patient has a history of acute pancreatitis in May 2023 that was suspected to be secondary to Normal.  LFTs were elevated on presentation.statin therapy and Macrobid have been discontinued. LFTs currently improving Follow CMP outpatient   Ground-level fall Per patient, she rolled off the bed when she was asleep, however she cannot recall the event.  No injuries noted on exam and CT head without any acute intracranial abnormalities. PT/OT consulted and have recommended skilled nurse facility placement for rehab   Type 2 diabetes mellitus without complications ([place]) History of uncontrolled diabetes with CBG as high as 411 on admission.  Last A1c approximately 3 weeks ago at 8.3% Inpatient, holding oral hypoglycemic agents  can restart home meds on discharge    Paroxysmal atrial fibrillation (HCC) Continue home metoprolol and Eliquis   Essential hypertension Continue metoprolol and lisinopril   AKI (acute kidney injury) ([place]) Likely prerenal in the setting of NSTEMI and acute pancreatitis.  Resolved   COPD (chronic obstructive pulmonary disease) (HCC) Continue home supplemental oxygen as needed Continue home bronchodilators             Discharge Instructions  Allergies as of 05/14/2022       Reactions   Morphine Sulfate Other (See  Comments)   BP bottoms out   Penicillins Diarrhea   Has patient had a PCN reaction causing immediate rash, facial/tongue/throat swelling, SOB or lightheadedness with hypotension: Yes Has patient had a PCN reaction causing severe rash involving mucus membranes or skin necrosis: Yes Has patient had a PCN reaction that required hospitalization No Has patient had a PCN reaction occurring within the last 10 years: No If all of the above answers are "NO", then may proceed with Cephalosporin use.        Medication List     STOP taking these medications    cephALEXin 500 MG capsule Commonly known as: KEFLEX   nitrofurantoin (macrocrystal-monohydrate) 100 MG capsule Commonly known as: MACROBID       TAKE these medications    acetaminophen 325 MG tablet Commonly known as: TYLENOL Take 650 mg by mouth every 4 (four) hours as needed for mild pain or moderate pain.   albuterol 108 (90 Base) MCG/ACT inhaler Commonly known as: VENTOLIN HFA Inhale 2 puffs into the lungs every 6 (six) hours as needed for wheezing or shortness of breath.   alum & mag hydroxide-simeth [id number] MG/5ML suspension Commonly known as: MAALOX PLUS Take 5 mLs by mouth every 4 (four) hours as needed for indigestion.   apixaban 5 MG Tabs tablet Commonly known as: ELIQUIS Take 1 tablet (5 mg total) by mouth 2 (two) times daily.   benzonatate 100 MG capsule Commonly known as: Tessalon Perles Take 1 capsule (100 mg total) by mouth 3 (three) times daily as needed for cough.   bismuth subsalicylate [id number] suspension Commonly known as: PEPTO BISMOL Take 30 mLs by mouth as needed.   carbamide peroxide 6.5 % OTIC solution Commonly known as: DEBROX Place 5 drops into both ears as needed.   cetirizine 10 MG chewable tablet Commonly known as: ZYRTEC Chew 10 mg by mouth daily as needed for allergies.   Clobetasol Propionate 0.05 % shampoo Apply 1 Application topically every morning. And wash out    dextromethorphan-guaiFENesin 10-100 MG/5ML liquid Commonly known as: ROBITUSSIN-DM Take 10 mLs by mouth every 4 (four) hours as needed for cough.   dextrose 40 % Gel Commonly known as: GLUTOSE Take 1 Tube by mouth as needed for low blood sugar.   fluocinolone 0.01 % external solution Commonly known as: SYNALAR Apply 1 Application topically at bedtime. Apply to scalp   furosemide 20 MG tablet Commonly known as: LASIX TAKE 1 TABLET BY MOUTH DAILY ONLY IF NEEDED.   glipiZIDE 10 MG 24 hr tablet Commonly known as: GLUCOTROL XL Take 10 mg by mouth 2 (two) times daily.   ipratropium-albuterol 0.5-2.5 (3) MG/3ML Soln Commonly known as: DUONEB Take 3 mLs by nebulization every 4 (four) hours as needed.   linagliptin 5 MG Tabs tablet Commonly known as: Tradjenta Take 1 tablet (5 mg total) by mouth daily.   lisinopril 20 MG tablet Commonly known as: ZESTRIL TAKE 1 TABLET BY MOUTH DAILY   magnesium hydroxide 400 MG/5ML suspension Commonly known as: MILK OF MAGNESIA Take 5 mLs by mouth daily as needed for mild constipation.   metFORMIN 500 MG 24 hr tablet Commonly known as: GLUCOPHAGE-XR Take 1 tablet (500 mg total) by  mouth 2 (two) times daily.   metoprolol succinate 25 MG 24 hr tablet Commonly known as: TOPROL-XL TAKE 1 TABLET BY MOUTH ONCE DAILY   multivitamin with minerals Tabs tablet Take 1 tablet by mouth daily.   nystatin powder Commonly known as: MYCOSTATIN/NYSTOP Apply to excoriated area topically twice daily as needed for skin breakdown   ondansetron 4 MG disintegrating tablet Commonly known as: ZOFRAN-ODT Take 1 tablet (4 mg total) by mouth every 8 (eight) hours as needed for nausea or vomiting.   ondansetron 4 MG tablet Commonly known as: ZOFRAN Take 4 mg by mouth every 6 (six) hours as needed for nausea or vomiting. Take one tablet by mouth three times daily as needed.   OXYGEN 2lpm for dyspnea or SOB   phenazopyridine 95 MG tablet Commonly known as:  PYRIDIUM Take 95 mg by mouth 3 (three) times daily as needed for pain.   polyethylene glycol 17 g packet Commonly known as: MIRALAX / GLYCOLAX Take 17 g by mouth daily as needed.   Vitamin D 50 MCG (2000 UT) Caps Give one capsule by mouth once daily .         Follow-up Information     End, Harrell Gave, MD. Go to.   Specialty: Cardiology Why: Appointment on Tuesday, 05/20/2022 at 3:35pm. Contact information: [address] [place] [address] [phone]                 Allergies  Allergen Reactions   Morphine Sulfate Other (See Comments)    BP bottoms out   Penicillins Diarrhea    Has patient had a PCN reaction causing immediate rash, facial/tongue/throat swelling, SOB or lightheadedness with hypotension: Yes Has patient had a PCN reaction causing severe rash involving mucus membranes or skin necrosis: Yes Has patient had a PCN reaction that required hospitalization No Has patient had a PCN reaction occurring within the last 10 years: No If all of the above answers are "NO", then may proceed with Cephalosporin use.     Subjective: pt feeling well today no concerns    Discharge Exam: BP 137/64 (BP Location: Left Arm)   Pulse 65   Temp 97.6 F (36.4 C)   Resp 16   Ht 5' (1.524 m)   Wt 77.7 kg   SpO2 97%   BMI 33.45 kg/m  General: Pt is alert, awake, not in acute distress Cardiovascular: RRR, S1/S2 +, no rubs, no gallops Respiratory: CTA bilaterally, no wheezing, no rhonchi Abdominal: Soft, NT, ND, bowel sounds + Extremities: no edema, no cyanosis     The results of significant diagnostics from this hospitalization (including imaging, microbiology, ancillary and laboratory) are listed below for reference.     Microbiology: Recent Results (from the past 240 hour(s))  Resp panel by RT-PCR (RSV, Flu A&B, Covid) Anterior Nasal Swab     Status: None   Collection Time: 05/08/22  6:47 AM   Specimen: Anterior Nasal Swab  Result Value  Ref Range Status   SARS Coronavirus 2 by RT PCR NEGATIVE NEGATIVE Final    Comment: (NOTE) SARS-CoV-2 target nucleic acids are NOT DETECTED.  The SARS-CoV-2 RNA is generally detectable in upper respiratory specimens during the acute phase of infection. The lowest concentration of SARS-CoV-2 viral copies this assay can detect is 138 copies/mL. A negative result does not preclude SARS-Cov-2 infection and should not be used as the sole basis for treatment or other patient management decisions. A negative result may occur with  improper specimen collection/handling,  submission of specimen other than nasopharyngeal swab, presence of viral mutation(s) within the areas targeted by this assay, and inadequate number of viral copies(<138 copies/mL). A negative result must be combined with clinical observations, patient history, and epidemiological information. The expected result is Negative.  Fact Sheet for Patients:  EntrepreneurPulse.com.au  Fact Sheet for Healthcare Providers:  IncredibleEmployment.be  This test is no t yet approved or cleared by the [place] FDA and  has been authorized for detection and/or diagnosis of SARS-CoV-2 by FDA under an Emergency Use Authorization (EUA). This EUA will remain  in effect (meaning this test can be used) for the duration of the COVID-19 declaration under Section 564(b)(1) of the Act, 21 U.S.C.section 360bbb-3(b)(1), unless the authorization is terminated  or revoked sooner.       Influenza A by PCR NEGATIVE NEGATIVE Final   Influenza B by PCR NEGATIVE NEGATIVE Final    Comment: (NOTE) The Xpert Xpress SARS-CoV-2/FLU/RSV plus assay is intended as an aid in the diagnosis of influenza from Nasopharyngeal swab specimens and should not be used as a sole basis for treatment. Nasal washings and aspirates are unacceptable for Xpert Xpress SARS-CoV-2/FLU/RSV testing.  Fact Sheet for  Patients: EntrepreneurPulse.com.au  Fact Sheet for Healthcare Providers: IncredibleEmployment.be  This test is not yet approved or cleared by the [place] FDA and has been authorized for detection and/or diagnosis of SARS-CoV-2 by FDA under an Emergency Use Authorization (EUA). This EUA will remain in effect (meaning this test can be used) for the duration of the COVID-19 declaration under Section 564(b)(1) of the Act, 21 U.S.C. section 360bbb-3(b)(1), unless the authorization is terminated or revoked.     Resp Syncytial Virus by PCR NEGATIVE NEGATIVE Final    Comment: (NOTE) Fact Sheet for Patients: EntrepreneurPulse.com.au  Fact Sheet for Healthcare Providers: IncredibleEmployment.be  This test is not yet approved or cleared by the [place] FDA and has been authorized for detection and/or diagnosis of SARS-CoV-2 by FDA under an Emergency Use Authorization (EUA). This EUA will remain in effect (meaning this test can be used) for the duration of the COVID-19 declaration under Section 564(b)(1) of the Act, 21 U.S.C. section 360bbb-3(b)(1), unless the authorization is terminated or revoked.  Performed at [facility], [address] [place], [place] [address]      Labs: BNP (last 3 results) No results for input(s): "BNP" in the last 8760 hours. Basic Metabolic Panel: Recent Labs  Lab 05/10/22 0104 05/11/22 0313 05/11/22 1059 05/12/22 0533 05/13/22 0704 05/14/22 0351  NA 136 135 135 137 134* 137  K 3.4* 3.7 3.6 3.6 3.7 3.7  CL 103 105 102 104 105 104  CO2 24 21* '24 23 23 24  '$ GLUCOSE 246* 172* 210* 205* 203* 231*  BUN 24* '15 15 13 14 19  '$ CREATININE 0.79 0.76 0.71 0.66 0.72 0.78  CALCIUM 8.7* 8.2* 8.5* 8.5* 8.5* 8.6*  MG 1.6*  --   --   --   --   --    Liver Function Tests: Recent Labs  Lab 05/09/22 1240 05/11/22 1059 05/12/22 0533 05/13/22 0704 05/14/22 0351  AST  331* 217* 162* 67* 40  ALT 310* 320* 308* 219* 169*  ALKPHOS 92 122 122 142* 136*  BILITOT 1.8* 1.3* 1.2 0.9 0.6  PROT 7.5 5.8* 5.4* 5.5* 5.7*  ALBUMIN 3.5 2.6* 2.5* 2.6* 2.6*   Recent Labs  Lab 05/08/22 0634 05/09/22 1124 05/10/22 0104  LIPASE 390* 562* 686*   No results for input(s): "AMMONIA" in  the last 168 hours. CBC: Recent Labs  Lab 05/09/22 1240 05/10/22 0104 05/11/22 0313 05/12/22 0533 05/13/22 0704 05/14/22 0351  WBC 11.8* 7.8 6.6 6.1 8.3 8.3  NEUTROABS 9.5*  --   --  3.0 3.7 3.1  HGB 12.5 10.9* 11.3* 11.4* 11.8* 11.5*  HCT 38.6 32.6* 33.4* 34.9* 35.9* 34.8*  MCV 91.3 88.1 87.7 89.0 89.8 88.3  PLT 183 158 190 205 223 241   Cardiac Enzymes: Recent Labs  Lab 05/10/22 0837  CKTOTAL 216   BNP: Invalid input(s): "POCBNP" CBG: Recent Labs  Lab 05/13/22 1114 05/13/22 1505 05/13/22 2134 05/14/22 0841 05/14/22 1151  GLUCAP 182* 297* 267* 229* 216*   D-Dimer No results for input(s): "DDIMER" in the last 72 hours. Hgb A1c No results for input(s): "HGBA1C" in the last 72 hours. Lipid Profile No results for input(s): "CHOL", "HDL", "LDLCALC", "TRIG", "CHOLHDL", "LDLDIRECT" in the last 72 hours. Thyroid function studies No results for input(s): "TSH", "T4TOTAL", "T3FREE", "THYROIDAB" in the last 72 hours.  Invalid input(s): "FREET3" Anemia work up No results for input(s): "VITAMINB12", "FOLATE", "FERRITIN", "TIBC", "IRON", "RETICCTPCT" in the last 72 hours. Urinalysis    Component Value Date/Time   COLORURINE YELLOW (A) 05/09/2022 1240   APPEARANCEUR HAZY (A) 05/09/2022 1240   APPEARANCEUR Clear 11/21/2015 0919   LABSPEC 1.022 05/09/2022 1240   PHURINE 5.0 05/09/2022 1240   GLUCOSEU >=500 (A) 05/09/2022 1240   HGBUR NEGATIVE 05/09/2022 1240   BILIRUBINUR NEGATIVE 05/09/2022 1240   BILIRUBINUR neg 12/05/2019 1206   BILIRUBINUR Negative 11/21/2015 0919   KETONESUR 20 (A) 05/09/2022 1240   PROTEINUR 30 (A) 05/09/2022 1240   UROBILINOGEN 0.2  12/05/2019 1206   NITRITE NEGATIVE 05/09/2022 1240   LEUKOCYTESUR NEGATIVE 05/09/2022 1240   Sepsis Labs Recent Labs  Lab 05/11/22 0313 05/12/22 0533 05/13/22 0704 05/14/22 0351  WBC 6.6 6.1 8.3 8.3   Microbiology Recent Results (from the past 240 hour(s))  Resp panel by RT-PCR (RSV, Flu A&B, Covid) Anterior Nasal Swab     Status: None   Collection Time: 05/08/22  6:47 AM   Specimen: Anterior Nasal Swab  Result Value Ref Range Status   SARS Coronavirus 2 by RT PCR NEGATIVE NEGATIVE Final    Comment: (NOTE) SARS-CoV-2 target nucleic acids are NOT DETECTED.  The SARS-CoV-2 RNA is generally detectable in upper respiratory specimens during the acute phase of infection. The lowest concentration of SARS-CoV-2 viral copies this assay can detect is 138 copies/mL. A negative result does not preclude SARS-Cov-2 infection and should not be used as the sole basis for treatment or other patient management decisions. A negative result may occur with  improper specimen collection/handling, submission of specimen other than nasopharyngeal swab, presence of viral mutation(s) within the areas targeted by this assay, and inadequate number of viral copies(<138 copies/mL). A negative result must be combined with clinical observations, patient history, and epidemiological information. The expected result is Negative.  Fact Sheet for Patients:  EntrepreneurPulse.com.au  Fact Sheet for Healthcare Providers:  IncredibleEmployment.be  This test is no t yet approved or cleared by the [place] FDA and  has been authorized for detection and/or diagnosis of SARS-CoV-2 by FDA under an Emergency Use Authorization (EUA). This EUA will remain  in effect (meaning this test can be used) for the duration of the COVID-19 declaration under Section 564(b)(1) of the Act, 21 U.S.C.section 360bbb-3(b)(1), unless the authorization is terminated  or revoked sooner.        Influenza A by PCR NEGATIVE NEGATIVE Final  Influenza B by PCR NEGATIVE NEGATIVE Final    Comment: (NOTE) The Xpert Xpress SARS-CoV-2/FLU/RSV plus assay is intended as an aid in the diagnosis of influenza from Nasopharyngeal swab specimens and should not be used as a sole basis for treatment. Nasal washings and aspirates are unacceptable for Xpert Xpress SARS-CoV-2/FLU/RSV testing.  Fact Sheet for Patients: EntrepreneurPulse.com.au  Fact Sheet for Healthcare Providers: IncredibleEmployment.be  This test is not yet approved or cleared by the [place] FDA and has been authorized for detection and/or diagnosis of SARS-CoV-2 by FDA under an Emergency Use Authorization (EUA). This EUA will remain in effect (meaning this test can be used) for the duration of the COVID-19 declaration under Section 564(b)(1) of the Act, 21 U.S.C. section 360bbb-3(b)(1), unless the authorization is terminated or revoked.     Resp Syncytial Virus by PCR NEGATIVE NEGATIVE Final    Comment: (NOTE) Fact Sheet for Patients: EntrepreneurPulse.com.au  Fact Sheet for Healthcare Providers: IncredibleEmployment.be  This test is not yet approved or cleared by the [place] FDA and has been authorized for detection and/or diagnosis of SARS-CoV-2 by FDA under an Emergency Use Authorization (EUA). This EUA will remain in effect (meaning this test can be used) for the duration of the COVID-19 declaration under Section 564(b)(1) of the Act, 21 U.S.C. section 360bbb-3(b)(1), unless the authorization is terminated or revoked.  Performed at [facility], [address] [place],  [address]    Imaging ECHOCARDIOGRAM COMPLETE  Result Date: 05/10/2022    ECHOCARDIOGRAM REPORT   Patient Name:   [name] Date of Exam: 05/10/2022 Medical Rec #:  [id number]        Height:       60.0 in Accession #:    [id number]        Weight:       171.3 lb Date of Birth:  Jul 20, 1931        BSA:          1.748 m Patient Age:    35 years         BP:           147/70 mmHg Patient Gender: F                HR:           78 bpm. Exam Location:  [facility] Procedure: 2D Echo and Intracardiac Opacification Agent Indications:    NSTEMI I21.4  History:        Patient has prior history of Echocardiogram examinations.  Sonographer:    Wayland Salinas RDCS Referring Phys: [id number] Jose Persia  Sonographer Comments: Technically difficult study due to poor echo windows. Image acquisition challenging due to patient body habitus. IMPRESSIONS  1. Left ventricular ejection fraction, by estimation, is 55 to 60%. The left ventricle has normal function. The left ventricle has no regional wall motion abnormalities. There is mild left ventricular hypertrophy. Left ventricular diastolic parameters are indeterminate.  2. Right ventricular systolic function is normal. The right ventricular size is normal.  3. Right atrial size was mildly dilated.  4. The mitral valve is normal in structure. No evidence of mitral valve regurgitation.  5. The aortic valve was not well visualized. Aortic valve regurgitation is not visualized. No aortic stenosis is present.  6. The inferior vena cava is normal in size with greater than 50% respiratory variability, suggesting right atrial pressure of 3 mmHg. FINDINGS  Left Ventricle: Left ventricular ejection fraction, by estimation, is 55 to 60%. The left  ventricle has normal function. The left ventricle has no regional wall motion abnormalities. Definity contrast agent was given IV to delineate the left ventricular  endocardial borders. The left ventricular internal cavity size was normal in size. There is mild left ventricular hypertrophy. Left ventricular diastolic parameters are indeterminate. Right Ventricle: The right ventricular size is normal. Right vetricular wall thickness was not assessed. Right ventricular systolic function is normal.  Left Atrium: Left atrial size was normal in size. Right Atrium: Right atrial size was mildly dilated. Pericardium: There is no evidence of pericardial effusion. Mitral Valve: The mitral valve is normal in structure. No evidence of mitral valve regurgitation. Tricuspid Valve: The tricuspid valve is normal in structure. Tricuspid valve regurgitation is trivial. Aortic Valve: The aortic valve was not well visualized. Aortic valve regurgitation is not visualized. No aortic stenosis is present. Aortic valve peak gradient measures 5.7 mmHg. Pulmonic Valve: The pulmonic valve was not well visualized. Pulmonic valve regurgitation is not visualized. Venous: The inferior vena cava is normal in size with greater than 50% respiratory variability, suggesting right atrial pressure of 3 mmHg. IAS/Shunts: No atrial level shunt detected by color flow Doppler.  LEFT VENTRICLE PLAX 2D LVIDd:         4.50 cm Diastology LVIDs:         3.10 cm LV e' medial:    5.00 cm/s LV PW:         1.20 cm LV E/e' medial:  10.4 LV IVS:        1.30 cm LV e' lateral:   6.85 cm/s                        LV E/e' lateral: 7.6  RIGHT VENTRICLE RV Basal diam:  3.70 cm RV S prime:     9.14 cm/s TAPSE (M-mode): 1.9 cm LEFT ATRIUM           Index        RIGHT ATRIUM           Index LA Vol (A4C): 49.0 ml 28.04 ml/m  RA Area:     19.60 cm                                    RA Volume:   64.40 ml  36.85 ml/m  AORTIC VALVE AV Vmax:      119.00 cm/s AV Peak Grad: 5.7 mmHg LVOT Vmax:    88.50 cm/s LVOT Vmean:   58.100 cm/s LVOT VTI:     0.187 m MITRAL VALVE MV Area (PHT): 1.60 cm     SHUNTS MV Decel Time: 474 msec     Systemic VTI: 0.19 m MV E velocity: 51.90 cm/s MV A velocity: 113.00 cm/s MV E/A ratio:  0.46 Dorris Carnes MD Electronically signed by Dorris Carnes MD Signature Date/Time: 05/10/2022/2:40:34 PM    Final    US Abdomen Limited RUQ (LIVER/GB)  Result Date: 05/10/2022 CLINICAL DATA:  Elevated liver function tests EXAM: ULTRASOUND ABDOMEN LIMITED RIGHT UPPER  QUADRANT COMPARISON:  CT 05/08/2022 FINDINGS: Gallbladder: No gallstones or wall thickening visualized. No sonographic Murphy sign noted by sonographer. Common bile duct: Diameter: 3 mm Liver: Diffusely echogenic hepatic parenchyma consistent with fatty liver infiltration. There is an area of fatty sparing along the margin of the gallbladder fossa. Please correlate with prior contrast CT scan of 05/08/2022 portal vein is patent on color Doppler imaging  with normal direction of blood flow towards the liver. Other: None. IMPRESSION: Fatty liver infiltration with areas of fatty sparing along the gallbladder fossa margin. No gallstones or ductal dilatation Electronically Signed   By: Jill Side M.D.   On: 05/10/2022 10:52      Time coordinating discharge: over 30 minutes  SIGNED:  Emeterio Reeve DO [facility]

## 2022-05-14 NOTE — TOC Transition Note (Signed)
Transition of Care [facility]) - CM/SW Discharge Note   Patient Details  Name: [name] MRN: [id number] Date of Birth: [birth year]  [facility] CM/SW Contact:  Laurena Slimmer, RN Phone Number: 05/14/2022, 2:00 PM   Clinical Narrative:    Per facility patient can be accepted today Spoke with Seth Bake in admissions Patient assigned room # 119 Nurse will call report to [phone] Patient, MD, nurse, and family notified.  Face sheet and medical necessity forms printed to the floor to be added to the EMS pack EMS arranged  Discharge summary and SNF tranfer report sent in [place].  TOC signing off.          Patient Goals and CMS Choice      Discharge Placement                         Discharge Plan and Services Additional resources added to the After Visit Summary for                                       Social Determinants of Health (SDOH) Interventions SDOH Screenings   Food Insecurity: No Food Insecurity (04/25/2020)  Housing: Low Risk  (05/11/2022)  Transportation Needs: No Transportation Needs (05/11/2022)  Utilities: Not At Risk (05/11/2022)  Alcohol Screen: Low Risk  (10/19/2020)  Depression (PHQ2-9): Low Risk  (10/19/2020)  Financial Resource Strain: Low Risk  (04/25/2020)  Physical Activity: Inactive (04/25/2020)  Social Connections: Moderately Isolated (04/25/2020)  Stress: No Stress Concern Present (04/25/2020)  Tobacco Use: Medium Risk (05/09/2022)     Readmission Risk Interventions     No data to display

## 2022-05-14 NOTE — Inpatient Diabetes Management (Signed)
Inpatient Diabetes Program Recommendations  AACE/ADA: New Consensus Statement on Inpatient Glycemic Control   Target Ranges:  Prepandial:   less than 140 mg/dL      Peak postprandial:   less than 180 mg/dL (1-2 hours)      Critically ill patients:  140 - 180 mg/dL    Latest Reference Range & Units 05/13/22 07:42 05/13/22 11:14 05/13/22 15:05 05/13/22 21:34 05/14/22 08:41  Glucose-Capillary 70 - 99 mg/dL 211 (H) 182 (H) 297 (H) 267 (H) 229 (H)   Review of Glycemic Control  Diabetes history: DM2 Outpatient Diabetes medications: Tradjenta 5 mg daily, Metformin 500 mg BID, Glipizide 10 mg BID Current orders for Inpatient glycemic control: Semglee 10 units QHS, Novolog 0-15 units TID with meals   Inpatient Diabetes Program Recommendations:     Insulin: Please consider increasing Semglee to 12 units QHS and ordering Novolog 3 units TID with meals for meal coverage if patient eats at least 50% of meals.   Thanks, Barnie Alderman, RN, MSN, [place] Diabetes Coordinator Inpatient Diabetes Program [phone] (Team Pager from 8am to [place])

## 2022-05-16 ENCOUNTER — Telehealth: Payer: Self-pay | Admitting: Internal Medicine

## 2022-05-16 NOTE — Telephone Encounter (Signed)
  Pt's daughter said, when pt was in the hospital another cards saw pt and was told they will send an order for pt be on diabetic diet. She wants to check in if that was sent to [facility]

## 2022-05-16 NOTE — Telephone Encounter (Signed)
Reviewed request with Dr. Saunders Revel and prescription was signed. Daughter was notified and provided numbers to where she is staying in rehab there at [facility]. [phone]  Called number listed and they provided fax number of [phone]  Also called Dietician for [facility] at [phone] and left voicemail message.   Will fax prescription to number provided.

## 2022-05-20 ENCOUNTER — Ambulatory Visit: Payer: Medicare Other | Admitting: Medical

## 2022-05-21 ENCOUNTER — Encounter: Payer: Self-pay | Admitting: Student

## 2022-05-21 ENCOUNTER — Non-Acute Institutional Stay (SKILLED_NURSING_FACILITY): Payer: Medicare Other | Admitting: Student

## 2022-05-21 DIAGNOSIS — J432 Centrilobular emphysema: Secondary | ICD-10-CM

## 2022-05-21 DIAGNOSIS — I700 Atherosclerosis of aorta: Secondary | ICD-10-CM | POA: Diagnosis not present

## 2022-05-21 DIAGNOSIS — I480 Paroxysmal atrial fibrillation: Secondary | ICD-10-CM

## 2022-05-21 DIAGNOSIS — K859 Acute pancreatitis without necrosis or infection, unspecified: Secondary | ICD-10-CM

## 2022-05-21 DIAGNOSIS — I10 Essential (primary) hypertension: Secondary | ICD-10-CM | POA: Diagnosis not present

## 2022-05-21 DIAGNOSIS — Z66 Do not resuscitate: Secondary | ICD-10-CM

## 2022-05-21 DIAGNOSIS — I252 Old myocardial infarction: Secondary | ICD-10-CM | POA: Diagnosis not present

## 2022-05-21 DIAGNOSIS — N179 Acute kidney failure, unspecified: Secondary | ICD-10-CM

## 2022-05-21 DIAGNOSIS — E1159 Type 2 diabetes mellitus with other circulatory complications: Secondary | ICD-10-CM

## 2022-05-21 DIAGNOSIS — K861 Other chronic pancreatitis: Secondary | ICD-10-CM

## 2022-05-21 NOTE — Progress Notes (Signed)
Provider:  Dr. Dewayne Shorter Location:  Other [place].  Nursing Home Room Number: [place]:  SNF ([phone])  PCP: Dewayne Shorter, MD Patient Care Team: Dewayne Shorter, MD as PCP - General (Family Medicine) End, Harrell Gave, MD as PCP - Cardiology (Cardiology) Leandrew Koyanagi, MD as Referring Physician (Ophthalmology) Lauree Chandler, NP as Nurse Practitioner (Geriatric Medicine)  Extended Emergency Contact Information Primary Emergency Contact: Polo Riley Address: [address]          [place], [place] [address] Johnnette Litter of [place] Phone: [phone] Work Phone: [phone] Relation: Daughter  Code Status: DNR Goals of Care: Advanced Directive information    05/21/2022    9:20 AM  Advanced Directives  Does Patient Have a Medical Advance Directive? Yes  Type of Advance Directive Out of facility DNR (pink MOST or yellow form)  Does patient want to make changes to medical advance directive? No - Patient declined    Chief Complaint  Patient presents with   New Admit To SNF    Admission    HPI: Patient is a 89 y.o. female seen today for admission to [facility] for rehab after admission to the hospital for NSTEMI. Patient w/ hx of chronic pancreatitis, T2Dm, Afib, Htn, COPD who was found to have acute on chronic pancreatitis and an AKI. She had a ground level fall where she rolled out of bed during her sleep and is now here for physical therapy and rehab. Her primary goal is to get back to her AL apartment.  Spoke to daughter who confirmed that patient has been delirius and is just starting to make sense again. Patient does not want invasive interventions. She remains DNR status.  Past Medical History:  Diagnosis Date   Acute pancreatitis 07/24/2021   Basal cell carcinoma 03/30/2008   left upper arm/excision   Basal cell carcinoma 03/31/2008   left upper arm, right lower leg   Basal cell carcinoma 01/18/2009   left upper  arm   Collagenous colitis    Diabetes mellitus without complication ([place])    Diverticulitis    Elevated LFTs 07/25/2021   Heart murmur    at birth, none since   History of echocardiogram    a. 11/2015: echo showing EF of 60-65% with no WMA. Grade 1 DD and mild MR noted.    Hyperlipidemia    Per [facility]   Hypertension    Intertrochanteric fracture of right femur ([place]) 06/11/2019   Lack of coordination    Per [facility] term current use of anticoagulant    Per [facility]   Noninfective gastroenteritis and colitis, unspecified    Per [facility]   PAF (paroxysmal atrial fibrillation) ([place])    a. initially diagnosed in 08/2016 --> started on Eliquis   Squamous cell carcinoma of skin 05/16/2020   Left nasal tip anterior to ala, refer to [facility]   Syncope    a. initially occurring in Fall 2016 b. Hospitalized in 10/2015 and 11/2015 for recurrent episodes.    Past Surgical History:  Procedure Laterality Date   ABDOMINAL HYSTERECTOMY     BACK SURGERY  08/08/2008   Lumbar discectomy   COLONOSCOPY WITH PROPOFOL     EXCISION/RELEASE BURSA HIP Left 05/03/2019   Procedure: HIP ABDUCTOR REPAIR;  Surgeon: Hessie Knows, MD;  Location: [facility] ORS;  Service: Orthopedics;  Laterality: Left;   INTRAMEDULLARY (IM) NAIL INTERTROCHANTERIC Right 06/13/2019   Procedure: INTRAMEDULLARY (IM) NAIL INTERTROCHANTRIC;  Surgeon: Rudene Christians,  Legrand Como, MD;  Location: [facility] ORS;  Service: Orthopedics;  Laterality: Right;   [place]    reports that she quit smoking about 44 years ago. Her smoking use included cigarettes. She has never used smokeless tobacco. She reports that she does not currently use alcohol. She reports that she does not use drugs. 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  Tobacco Use   Smoking status: Former    Types: Cigarettes    Quit date: 03/09/1978    Years since quitting: 44.2   Smokeless tobacco: Never  Vaping Use   Vaping Use: Never used  Substance and Sexual Activity   Alcohol use: Not Currently    Comment: once a month--mixed drink   Drug use: No   Sexual activity: Not on file  Other Topics Concern   Not on file  Social History Narrative   2 daughters and a son (76 local plus [place] and [place])      Has living will    Daughter Jackelyn Poling is [facility] POA   Has DNR   No feeding tube   Social Determinants of Health   Financial Resource Strain: Low Risk  (04/25/2020)   Overall Financial Resource Strain (CARDIA)    Difficulty of Paying Living Expenses: Not hard at all  Food Insecurity: No Food Insecurity (04/25/2020)   Hunger Vital Sign    Worried About Running Out of Food in the Last Year: Never true    [place] in the Last Year: Never true  Transportation Needs: No Transportation Needs (05/11/2022)   PRAPARE - Hydrologist (Medical): No    Lack of Transportation (Non-Medical): No  Physical Activity: Inactive (04/25/2020)   Exercise Vital Sign    Days of Exercise per Week: 0 days    Minutes of Exercise per Session: 0 min  Stress: No Stress Concern Present (04/25/2020)   [place]    Feeling of Stress : Not at all  Social Connections: Moderately Isolated (04/25/2020)   Social Connection and Isolation Panel [NHANES]    Frequency of Communication with Friends and Family: More than three times a week    Frequency of Social Gatherings with Friends and Family: More than three times a week    Attends Religious Services: More than 4 times per year    Active Member of [employer] or Organizations: No    Attends Archivist Meetings: Never    Marital Status: Widowed  Intimate Partner Violence: Not At Risk (05/11/2022)    Humiliation, Afraid, Rape, and Kick questionnaire    Fear of Current or Ex-Partner: No    Emotionally Abused: No    Physically Abused: No    Sexually Abused: No    Functional Status Survey:    Family History  Problem Relation Age of Onset   Hypertension Mother    Diabetes Mother    Lung cancer Father     Health Maintenance  Topic Date Due   Zoster Vaccines- Shingrix (1 of 2) Never done   OPHTHALMOLOGY EXAM  06/08/2021   COVID-19 Vaccine (8 - 2023-24 season) 03/14/2022   Medicare Annual Wellness (AWV)  04/29/2022   FOOT EXAM  04/29/2022   HEMOGLOBIN A1C  10/16/2022   DTaP/Tdap/Td (5 - Td or Tdap) 02/27/2025   Pneumonia Vaccine 28+ Years old  Completed   INFLUENZA VACCINE  Completed   DEXA SCAN  Completed   HPV VACCINES  Aged Out    Allergies  Allergen Reactions   Morphine Sulfate Other (See Comments)    BP bottoms out   Penicillins Diarrhea    Has patient had a PCN reaction causing immediate rash, facial/tongue/throat swelling, SOB or lightheadedness with hypotension: Yes Has patient had a PCN reaction causing severe rash involving mucus membranes or skin necrosis: Yes Has patient had a PCN reaction that required hospitalization No Has patient had a PCN reaction occurring within the last 10 years: No If all of the above answers are "NO", then may proceed with Cephalosporin use.    Outpatient Encounter Medications as of 05/21/2022  Medication Sig   acetaminophen (TYLENOL) 325 MG tablet Take 650 mg by mouth every 4 (four) hours as needed for mild pain or moderate pain.   albuterol (VENTOLIN HFA) 108 (90 Base) MCG/ACT inhaler Inhale 2 puffs into the lungs every 6 (six) hours as needed for wheezing or shortness of breath.   apixaban (ELIQUIS) 5 MG TABS tablet Take 1 tablet (5 mg total) by mouth 2 (two) times daily.   benzonatate (TESSALON PERLES) 100 MG capsule Take 1 capsule (100 mg total) by mouth 3 (three) times daily as needed for cough.   Cholecalciferol (VITAMIN  D) 50 MCG (2000 UT) CAPS Give one capsule by mouth once daily .   Clobetasol Propionate 0.05 % shampoo Apply 1 Application topically every morning. And wash out   dextrose (GLUTOSE) 40 % GEL Take 1 Tube by mouth as needed for low blood sugar.   fluocinolone (SYNALAR) 0.01 % external solution Apply 1 Application topically at bedtime. Apply to scalp   glipiZIDE (GLUCOTROL XL) 10 MG 24 hr tablet Take 10 mg by mouth 2 (two) times daily.   Infant Care Products [facility] EX) Apply to buttocks/periarea topically every shift   ipratropium-albuterol (DUONEB) 0.5-2.5 (3) MG/3ML SOLN Take 3 mLs by nebulization every 4 (four) hours as needed.   linagliptin (TRADJENTA) 5 MG TABS tablet Take 1 tablet (5 mg total) by mouth daily.   lisinopril (ZESTRIL) 20 MG tablet TAKE 1 TABLET BY MOUTH DAILY   metFORMIN (GLUCOPHAGE-XR) 500 MG 24 hr tablet Take 1 tablet (500 mg total) by mouth 2 (two) times daily.   metoprolol succinate (TOPROL-XL) 25 MG 24 hr tablet TAKE 1 TABLET BY MOUTH ONCE DAILY   Multiple Vitamin (MULTIVITAMIN WITH MINERALS) TABS tablet Take 1 tablet by mouth daily.   polyethylene glycol (MIRALAX / GLYCOLAX) 17 g packet Take 17 g by mouth daily as needed.   [DISCONTINUED] alum & mag hydroxide-simeth (MAALOX PLUS) 400-400-40 MG/5ML suspension Take 5 mLs by mouth every 4 (four) hours as needed for indigestion.   [DISCONTINUED] bismuth subsalicylate (PEPTO BISMOL) 262 MG/15ML suspension Take 30 mLs by mouth as needed.   [DISCONTINUED] carbamide peroxide (DEBROX) 6.5 % OTIC solution Place 5 drops into both ears as needed.   [DISCONTINUED] cetirizine (ZYRTEC) 10 MG chewable tablet Chew 10 mg by mouth daily as needed for allergies.   [DISCONTINUED] dextromethorphan-guaiFENesin (ROBITUSSIN-DM) 10-100 MG/5ML liquid Take 10 mLs by mouth every 4 (four) hours as needed for cough.   [DISCONTINUED] furosemide (LASIX) 20 MG tablet TAKE 1 TABLET BY MOUTH DAILY ONLY IF NEEDED.   [DISCONTINUED] magnesium hydroxide (MILK  OF MAGNESIA) 400 MG/5ML suspension  Take 5 mLs by mouth daily as needed for mild constipation.   [DISCONTINUED] nystatin (MYCOSTATIN/NYSTOP) powder Apply to excoriated area topically twice daily as needed for skin breakdown   [DISCONTINUED] ondansetron (ZOFRAN) 4 MG tablet Take 4 mg by mouth every 6 (six) hours as needed for nausea or vomiting. Take one tablet by mouth three times daily as needed.   [DISCONTINUED] ondansetron (ZOFRAN-ODT) 4 MG disintegrating tablet Take 1 tablet (4 mg total) by mouth every 8 (eight) hours as needed for nausea or vomiting.   [DISCONTINUED] OXYGEN 2lpm for dyspnea or SOB   [DISCONTINUED] phenazopyridine (PYRIDIUM) 95 MG tablet Take 95 mg by mouth 3 (three) times daily as needed for pain.   No facility-administered encounter medications on file as of 05/21/2022.    Review of Systems  Vitals:   05/21/22 0907  BP: 123/60  Pulse: 74  Resp: 18  Temp: (!) 97 F (36.1 C)  SpO2: 96%  Weight: 167 lb 6.4 oz (75.9 kg)  Height: 5' (1.524 m)   Body mass index is 32.69 kg/m. Physical Exam Vitals reviewed.  Constitutional:      Appearance: Normal appearance.  Cardiovascular:     Rate and Rhythm: Normal rate.     Pulses: Normal pulses.  Pulmonary:     Effort: Pulmonary effort is normal.  Abdominal:     General: Abdomen is flat.     Palpations: Abdomen is soft.  Skin:    General: Skin is warm and dry.  Neurological:     General: No focal deficit present.     Mental Status: She is alert. She is disoriented.     Labs reviewed: Basic Metabolic Panel: Recent Labs    05/10/22 0104 05/11/22 0313 05/12/22 0533 05/13/22 0704 05/14/22 0351  NA 136   < > 137 134* 137  K 3.4*   < > 3.6 3.7 3.7  CL 103   < > 104 105 104  CO2 24   < > 23 23 24   GLUCOSE 246*   < > 205* 203* 231*  BUN 24*   < > 13 14 19   CREATININE 0.79   < > 0.66 0.72 0.78  CALCIUM 8.7*   < > 8.5* 8.5* 8.6*  MG 1.6*  --   --   --   --    < > = values in this interval not displayed.    Liver Function Tests: Recent Labs    05/12/22 0533 05/13/22 0704 05/14/22 0351  AST 162* 67* 40  ALT 308* 219* 169*  ALKPHOS 122 142* 136*  BILITOT 1.2 0.9 0.6  PROT 5.4* 5.5* 5.7*  ALBUMIN 2.5* 2.6* 2.6*   Recent Labs    05/08/22 0634 05/09/22 1124 05/10/22 0104  LIPASE 390* 562* 686*   No results for input(s): "AMMONIA" in the last 8760 hours. CBC: Recent Labs    05/12/22 0533 05/13/22 0704 05/14/22 0351  WBC 6.1 8.3 8.3  NEUTROABS 3.0 3.7 3.1  HGB 11.4* 11.8* 11.5*  HCT 34.9* 35.9* 34.8*  MCV 89.0 89.8 88.3  PLT 205 223 241   Cardiac Enzymes: Recent Labs    05/10/22 0837  CKTOTAL 216   BNP: Invalid input(s): "POCBNP" Lab Results  Component Value Date   HGBA1C 8.3 (H) 04/17/2022   Lab Results  Component Value Date   TSH 2.284 04/17/2022   No results found for: "VITAMINB12" No results found for: "FOLATE" No results found for: "IRON", "TIBC", "FERRITIN"  Imaging and Procedures obtained prior to SNF admission: ECHOCARDIOGRAM  COMPLETE  Result Date: 05/10/2022    ECHOCARDIOGRAM REPORT   Patient Name:   [name] Date of Exam: 05/10/2022 Medical Rec #:  [id number]        Height:       60.0 in Accession #:    [id number]       Weight:       171.3 lb Date of Birth:  1932/02/03        BSA:          1.748 m Patient Age:    49 years         BP:           147/70 mmHg Patient Gender: F                HR:           78 bpm. Exam Location:  [facility] Procedure: 2D Echo and Intracardiac Opacification Agent Indications:    NSTEMI I21.4  History:        Patient has prior history of Echocardiogram examinations.  Sonographer:    Wayland Salinas RDCS Referring Phys: [id number] Jose Persia  Sonographer Comments: Technically difficult study due to poor echo windows. Image acquisition challenging due to patient body habitus. IMPRESSIONS  1. Left ventricular ejection fraction, by estimation, is 55 to 60%. The left ventricle has normal function. The left ventricle has no regional wall  motion abnormalities. There is mild left ventricular hypertrophy. Left ventricular diastolic parameters are indeterminate.  2. Right ventricular systolic function is normal. The right ventricular size is normal.  3. Right atrial size was mildly dilated.  4. The mitral valve is normal in structure. No evidence of mitral valve regurgitation.  5. The aortic valve was not well visualized. Aortic valve regurgitation is not visualized. No aortic stenosis is present.  6. The inferior vena cava is normal in size with greater than 50% respiratory variability, suggesting right atrial pressure of 3 mmHg. FINDINGS  Left Ventricle: Left ventricular ejection fraction, by estimation, is 55 to 60%. The left ventricle has normal function. The left ventricle has no regional wall motion abnormalities. Definity contrast agent was given IV to delineate the left ventricular  endocardial borders. The left ventricular internal cavity size was normal in size. There is mild left ventricular hypertrophy. Left ventricular diastolic parameters are indeterminate. Right Ventricle: The right ventricular size is normal. Right vetricular wall thickness was not assessed. Right ventricular systolic function is normal. Left Atrium: Left atrial size was normal in size. Right Atrium: Right atrial size was mildly dilated. Pericardium: There is no evidence of pericardial effusion. Mitral Valve: The mitral valve is normal in structure. No evidence of mitral valve regurgitation. Tricuspid Valve: The tricuspid valve is normal in structure. Tricuspid valve regurgitation is trivial. Aortic Valve: The aortic valve was not well visualized. Aortic valve regurgitation is not visualized. No aortic stenosis is present. Aortic valve peak gradient measures 5.7 mmHg. Pulmonic Valve: The pulmonic valve was not well visualized. Pulmonic valve regurgitation is not visualized. Venous: The inferior vena cava is normal in size with greater than 50% respiratory variability,  suggesting right atrial pressure of 3 mmHg. IAS/Shunts: No atrial level shunt detected by color flow Doppler.  LEFT VENTRICLE PLAX 2D LVIDd:         4.50 cm Diastology LVIDs:         3.10 cm LV e' medial:    5.00 cm/s LV PW:         1.20 cm LV E/e' medial:  10.4 LV IVS:        1.30 cm LV e' lateral:   6.85 cm/s                        LV E/e' lateral: 7.6  RIGHT VENTRICLE RV Basal diam:  3.70 cm RV S prime:     9.14 cm/s TAPSE (M-mode): 1.9 cm LEFT ATRIUM           Index        RIGHT ATRIUM           Index LA Vol (A4C): 49.0 ml 28.04 ml/m  RA Area:     19.60 cm                                    RA Volume:   64.40 ml  36.85 ml/m  AORTIC VALVE AV Vmax:      119.00 cm/s AV Peak Grad: 5.7 mmHg LVOT Vmax:    88.50 cm/s LVOT Vmean:   58.100 cm/s LVOT VTI:     0.187 m MITRAL VALVE MV Area (PHT): 1.60 cm     SHUNTS MV Decel Time: 474 msec     Systemic VTI: 0.19 m MV E velocity: 51.90 cm/s MV A velocity: 113.00 cm/s MV E/A ratio:  0.46 Dorris Carnes MD Electronically signed by Dorris Carnes MD Signature Date/Time: 05/10/2022/2:40:34 PM    Final    US Abdomen Limited RUQ (LIVER/GB)  Result Date: 05/10/2022 CLINICAL DATA:  Elevated liver function tests EXAM: ULTRASOUND ABDOMEN LIMITED RIGHT UPPER QUADRANT COMPARISON:  CT 05/08/2022 FINDINGS: Gallbladder: No gallstones or wall thickening visualized. No sonographic Murphy sign noted by sonographer. Common bile duct: Diameter: 3 mm Liver: Diffusely echogenic hepatic parenchyma consistent with fatty liver infiltration. There is an area of fatty sparing along the margin of the gallbladder fossa. Please correlate with prior contrast CT scan of 05/08/2022 portal vein is patent on color Doppler imaging with normal direction of blood flow towards the liver. Other: None. IMPRESSION: Fatty liver infiltration with areas of fatty sparing along the gallbladder fossa margin. No gallstones or ductal dilatation Electronically Signed   By: Jill Side M.D.   On: 05/10/2022 10:52     Assessment/Plan 1. Hx of non-ST elevation myocardial infarction (NSTEMI) Patient was admitted after a fall found to have elevated troponins. S/p 48 hours  2. Aortic atherosclerosis ([place]) Deferred ASA due to [place]. Continue 5 mg BID.   3. Essential hypertension BP well-controlled on current regimen. Continue furosemide 20 mg daily. Lisinopril 20 mg daily. Metoprolol succinate 25 mg daily.   4. Paroxysmal atrial fibrillation (HCC) Rate well-controlled on metoprolol 25 mg daily. Continue eliquis 5 mg BID.   5. Type 2 diabetes mellitus with other circulatory complication, without long-term current use of insulin (HCC) DM levels above goal on most recent A1c. Also, glucose checks since arrival above goal. Plan to continue dose of tradjenta at 5mg  daily. Increase metformin to 1000mg  BID. Continue glipizide 10 mg BID Continue to monitor glucose checks, and consider initiation of injectable medication for glucose control.  Hemoglobin A1C  Date/Time Value Ref Range Status  07/16/2021 12:00 AM 10.9  Final   Hgb A1c MFr Bld  Date/Time Value Ref Range Status  04/17/2022 08:20 PM 8.3 (H) 4.8 - 5.6 % Final    Comment:    (NOTE) Pre diabetes:  5.7%-6.4%  Diabetes:              >6.4%  Glycemic control for   <7.0% adults with diabetes   ]  6. Centrilobular emphysema ([place]) Patient is stable on room air. Continue PRN duonebs q4 hours. Continue albuterol PRN.   7. Acute on chronic pancreatitis (HCC) No pain at this time. CTM.   8. AKI (acute kidney injury) ([place]) Found to have acute AKI during hospitalization, however at baseline. Repeat BMP given resumption of DM medications.   9. DNR (do not resuscitate) Patient maintains DRN. Status.     Family/ staff Communication: Daughter and nursing  Labs/tests ordered: CBC, BMP

## 2022-06-18 ENCOUNTER — Non-Acute Institutional Stay: Payer: Medicare Other | Admitting: Student

## 2022-06-18 ENCOUNTER — Encounter: Payer: Self-pay | Admitting: Student

## 2022-06-18 DIAGNOSIS — I480 Paroxysmal atrial fibrillation: Secondary | ICD-10-CM

## 2022-06-18 DIAGNOSIS — I10 Essential (primary) hypertension: Secondary | ICD-10-CM | POA: Diagnosis not present

## 2022-06-18 DIAGNOSIS — I252 Old myocardial infarction: Secondary | ICD-10-CM | POA: Diagnosis not present

## 2022-06-18 DIAGNOSIS — E1159 Type 2 diabetes mellitus with other circulatory complications: Secondary | ICD-10-CM

## 2022-06-18 DIAGNOSIS — L602 Onychogryphosis: Secondary | ICD-10-CM

## 2022-06-18 DIAGNOSIS — Z66 Do not resuscitate: Secondary | ICD-10-CM

## 2022-06-18 DIAGNOSIS — J432 Centrilobular emphysema: Secondary | ICD-10-CM

## 2022-06-18 NOTE — Progress Notes (Signed)
Location:  Other [facility].  Nursing Home Room Number: [name] Place of Service:  ALF [phone]) Provider:  [name], MD  Patient Care Team: [name] Mealing, MD as PCP - General (Family Medicine) End, [name] Deer, MD as PCP - Cardiology (Cardiology) [name] Mola, MD as Referring Physician (Ophthalmology) [name] Seller, NP as Nurse Practitioner (Geriatric Medicine)  Extended Emergency Contact Information Primary Emergency Contact: [name] Address: [address]          [place], [place] [address] [name] of [place] Home Phone: [phone] Work Phone: [phone] Relation: Daughter  Code Status:  DNR Goals of care: Advanced Directive information    06/18/2022   12:30 PM  Advanced Directives  Does Patient Have a Medical Advance Directive? Yes  Type of Advance Directive Out of facility DNR (pink MOST or yellow form)  Does patient want to make changes to medical advance directive? No - Patient declined     Chief Complaint  Patient presents with   Medical Management of Chronic Issues    Medical Management of Chronic Issues.     HPI:  Pt is a 89 y.o. female seen today for medical management of chronic diseases.    Patient recently discharged from skilled nursing after an admission to the hospital for an NSTEMI without interventions.   Discussed with patient concern for current health and requesting clarification on her desire to have future interventions. She states, if it seems like some thing I will recover from, then send me to the hospital, if I'm not going to have life afterwards, just make me comfortable. For example, if I have a heart attack tonight, just make me comfortable.   She has a small area on her second right toe that was noted upon her return to AL. She has some discomfort.   Nursing state they can add her to the upcoming podiatry visit list.   Past Medical History:  Diagnosis Date   Acute pancreatitis 07/24/2021    Basal cell carcinoma 03/30/2008   left upper arm/excision   Basal cell carcinoma 03/31/2008   left upper arm, right lower leg   Basal cell carcinoma 01/18/2009   left upper arm   Collagenous colitis    Diabetes mellitus without complication    Diverticulitis    Elevated LFTs 07/25/2021   Heart murmur    at birth, none since   History of echocardiogram    a. 11/2015: echo showing EF of 60-65% with no WMA. Grade 1 DD and mild MR noted.    Hyperlipidemia    Per [facility]   Hypertension    Intertrochanteric fracture of right femur 06/11/2019   Lack of coordination    Per [facility] term current use of anticoagulant    Per [facility]   Noninfective gastroenteritis and colitis, unspecified    Per [facility]   PAF (paroxysmal atrial fibrillation)    a. initially diagnosed in 08/2016 --> started on Eliquis   Squamous cell carcinoma of skin 05/16/2020   Left nasal tip anterior to ala, refer to [facility]   Syncope    a. initially occurring in Fall 2016 b. Hospitalized in 10/2015 and 11/2015 for recurrent episodes.    Past Surgical History:  Procedure Laterality Date   ABDOMINAL HYSTERECTOMY     BACK SURGERY  08/08/2008   Lumbar discectomy   COLONOSCOPY WITH PROPOFOL     EXCISION/RELEASE BURSA HIP Left 05/03/2019   Procedure: HIP ABDUCTOR REPAIR;  Surgeon: [name] Bucker, MD;  Location: [facility] ORS;  Service: Orthopedics;  Laterality: Left;   INTRAMEDULLARY (IM) NAIL INTERTROCHANTERIC Right 06/13/2019   Procedure: INTRAMEDULLARY (IM) NAIL INTERTROCHANTRIC;  Surgeon: [name] Bucker, MD;  Location: [facility] ORS;  Service: Orthopedics;  Laterality: Right;   NECK SURGERY  1980    Allergies  Allergen Reactions   Morphine Sulfate Other (See Comments)    BP bottoms out   Penicillins Diarrhea    Has patient had a PCN reaction causing immediate rash, facial/tongue/throat swelling, SOB or lightheadedness with hypotension: Yes Has patient had a PCN reaction causing severe rash involving mucus  membranes or skin necrosis: Yes Has patient had a PCN reaction that required hospitalization No Has patient had a PCN reaction occurring within the last 10 years: No If all of the above answers are "NO", then may proceed with Cephalosporin use.    Outpatient Encounter Medications as of 06/18/2022  Medication Sig   acetaminophen (TYLENOL) 325 MG tablet Take 650 mg by mouth every 4 (four) hours as needed for mild pain or moderate pain.   albuterol (VENTOLIN HFA) 108 (90 Base) MCG/ACT inhaler Inhale 2 puffs into the lungs every 6 (six) hours as needed for wheezing or shortness of breath.   alum & mag hydroxide-simeth (MAALOX/MYLANTA) 200-200-20 MG/5 SUSP Give 30ml by mouth every 4 hours as needed.   apixaban (ELIQUIS) 5 MG TABS tablet Take 1 tablet (5 mg total) by mouth 2 (two) times daily.   benzonatate (TESSALON PERLES) 100 MG capsule Take 1 capsule (100 mg total) by mouth 3 (three) times daily as needed for cough.   bisacodyl (DULCOLAX) 10 MG suppository Place 10 mg rectally as needed for moderate constipation.   bismuth subsalicylate (PEPTO BISMOL) 262 MG/15ML suspension Take 30 mLs by mouth every 6 (six) hours as needed.   carbamide peroxide (DEBROX) 6.5 % OTIC solution Place 5 drops into both ears as needed.   cetirizine (ZYRTEC) 10 MG tablet Take 10 mg by mouth daily as needed for allergies.   Cholecalciferol (VITAMIN D) 50 MCG (2000 UT) CAPS Give one capsule by mouth once daily .   Clobetasol Propionate 0.05 % shampoo Apply 1 Application topically every morning. And wash out   dextromethorphan-guaiFENesin (ROBITUSSIN-DM) 10-100 MG/5ML liquid Take 10 mLs by mouth every 4 (four) hours as needed for cough.   dextrose (GLUTOSE) 40 % GEL Take 1 Tube by mouth as needed for low blood sugar.   fluocinolone (SYNALAR) 0.01 % external solution Apply 1 Application topically at bedtime. Apply to scalp   furosemide (LASIX) 20 MG tablet Take 20 mg by mouth as needed.   glipiZIDE (GLUCOTROL XL) 10 MG 24  hr tablet Take 10 mg by mouth 2 (two) times daily.   Infant Care Products [facility] EX) Apply to buttocks/periarea topically every shift   ipratropium-albuterol (DUONEB) 0.5-2.5 (3) MG/3ML SOLN Take 3 mLs by nebulization every 4 (four) hours as needed.   linagliptin (TRADJENTA) 5 MG TABS tablet Take 1 tablet (5 mg total) by mouth daily.   lisinopril (ZESTRIL) 20 MG tablet TAKE 1 TABLET BY MOUTH DAILY   magnesium hydroxide (MILK OF MAGNESIA) 400 MG/5ML suspension Take 15 mLs by mouth daily as needed for mild constipation.   metFORMIN (GLUCOPHAGE-XR) 500 MG 24 hr tablet Take 1 tablet (500 mg total) by mouth 2 (two) times daily.   metoprolol succinate (TOPROL-XL) 25 MG 24 hr tablet TAKE 1 TABLET BY MOUTH ONCE DAILY   Multiple Vitamin (MULTIVITAMIN WITH MINERALS) TABS tablet Take 1 tablet by mouth daily.  nystatin (MYCOSTATIN/NYSTOP) powder Apply to excoriated area topically as needed Twice daily.   ondansetron (ZOFRAN) 4 MG tablet Take 4 mg by mouth every 8 (eight) hours as needed for nausea or vomiting.   OXYGEN 2lpm as needed for dyspnea/SOB   polyethylene glycol (MIRALAX / GLYCOLAX) 17 g packet Take 17 g by mouth daily as needed.   No facility-administered encounter medications on file as of 06/18/2022.    Review of Systems  Immunization History  Administered Date(s) Administered   Fluad Quad(high Dose 65+) 12/20/2018   Influenza Split 01/01/2009, 01/02/2010, 01/13/2011, 12/22/2011   Influenza, High Dose Seasonal PF 01/18/2014, 01/01/2015, 11/30/2015, 12/01/2016, 01/11/2018, 12/08/2019   Influenza-Unspecified 01/08/2013, 11/08/2020, 12/25/2021   Moderna Covid-19 Vaccine Bivalent Booster 19yrs & up 11/30/2020, 08/06/2021, 01/17/2022   PFIZER Comirnaty(Gray Top)Covid-19 Tri-Sucrose Vaccine 07/27/2020   PFIZER(Purple Top)SARS-COV-2 Vaccination 04/01/2019, 04/22/2019, 12/05/2019   Pfizer Covid-19 Vaccine Bivalent Booster 53yrs & up 11/30/2020   Pneumococcal Conjugate-13 10/19/2013    Pneumococcal Polysaccharide-23 01/02/2010   Td 11/02/2003, 01/13/2011   Tdap 01/13/2011, 02/28/2015   Zoster, Live 07/14/2007   Pertinent  Health Maintenance Due  Topic Date Due   OPHTHALMOLOGY EXAM  06/08/2021   FOOT EXAM  04/29/2022   INFLUENZA VACCINE  10/09/2022   HEMOGLOBIN A1C  10/16/2022   DEXA SCAN  Completed      10/19/2020   11:04 AM 07/24/2021    4:09 PM 07/24/2021   10:50 PM 07/25/2021   10:02 AM 07/26/2021    1:00 AM  Fall Risk  Falls in the past year? 0      Was there an injury with Fall? 0      Fall Risk Category Calculator 0      Fall Risk Category (Retired) Low      (RETIRED) Patient Fall Risk Level Low fall risk Moderate fall risk High fall risk High fall risk High fall risk  Patient at Risk for Falls Due to Impaired balance/gait      Fall risk Follow up Falls evaluation completed       Functional Status Survey:    Vitals:   06/18/22 1210  BP: 132/66  Pulse: 69  Resp: 18  Temp: 98 F (36.7 C)  SpO2: 96%  Weight: 196 lb 3.2 oz (89 kg)  Height: 5' (1.524 m)   Body mass index is 38.32 kg/m. Physical Exam Constitutional:      Comments: Chronically ill-appearing  Cardiovascular:     Rate and Rhythm: Normal rate.     Pulses: Normal pulses.     Heart sounds: Normal heart sounds.  Pulmonary:     Effort: Pulmonary effort is normal.     Breath sounds: Normal breath sounds.  Abdominal:     Palpations: Abdomen is soft.  Neurological:     Mental Status: She is alert and oriented to person, place, and time. Mental status is at baseline.    Labs reviewed: Recent Labs    05/10/22 0104 05/11/22 0313 05/12/22 0533 05/13/22 0704 05/14/22 0351  NA 136   < > 137 134* 137  K 3.4*   < > 3.6 3.7 3.7  CL 103   < > 104 105 104  CO2 24   < > 23 23 24   GLUCOSE 246*   < > 205* 203* 231*  BUN 24*   < > 13 14 19   CREATININE 0.79   < > 0.66 0.72 0.78  CALCIUM 8.7*   < > 8.5* 8.5* 8.6*  MG 1.6*  --   --   --   --    < > =  values in this interval not  displayed.   Recent Labs    05/12/22 0533 05/13/22 0704 05/14/22 0351  AST 162* 67* 40  ALT 308* 219* 169*  ALKPHOS 122 142* 136*  BILITOT 1.2 0.9 0.6  PROT 5.4* 5.5* 5.7*  ALBUMIN 2.5* 2.6* 2.6*   Recent Labs    05/12/22 0533 05/13/22 0704 05/14/22 0351  WBC 6.1 8.3 8.3  NEUTROABS 3.0 3.7 3.1  HGB 11.4* 11.8* 11.5*  HCT 34.9* 35.9* 34.8*  MCV 89.0 89.8 88.3  PLT 205 223 241   Lab Results  Component Value Date   TSH 2.284 04/17/2022   Lab Results  Component Value Date   HGBA1C 8.3 (H) 04/17/2022   Lab Results  Component Value Date   CHOL 198 05/10/2022   HDL 38 (L) 05/10/2022   LDLCALC 118 (H) 05/10/2022   TRIG 208 (H) 05/10/2022   CHOLHDL 5.2 05/10/2022    Significant Diagnostic Results in last 30 days:  No results found.  Assessment/Plan Hx of non-ST elevation myocardial infarction (NSTEMI)  Essential hypertension  Paroxysmal atrial fibrillation  Type 2 diabetes mellitus with other circulatory complication, without long-term current use of insulin  Centrilobular emphysema  DNR (do not resuscitate)  Overgrown toenails Patient returning to her AL apartment after brief stay in SNF. Admission was after a NSTEMI w/o interventions. Patient states at this time she is not interested in invasive interventions and if she becomes ill to the point of not recovering to her current state would want to transition to comfort measures. Remains independent with ambulation and receives some assistance for bathing and medications. She maintains she would like to remain DNR. Rate well controlled. BP well-controlled. DM poorly controlled, f/u in 3 mo.   Family/ staff Communication: nursing  Labs/tests ordered:  q3 mo A1c  I spent 30 min for the care of this patient with 12 minutes discussing goals of care and the remaining time face to face time, chart review, and clinical documentation

## 2022-06-27 ENCOUNTER — Encounter: Payer: Self-pay | Admitting: Internal Medicine

## 2022-06-27 ENCOUNTER — Ambulatory Visit: Payer: Medicare Other | Attending: Internal Medicine | Admitting: Internal Medicine

## 2022-06-27 VITALS — BP 140/68 | HR 72 | Ht 62.0 in | Wt 170.2 lb

## 2022-06-27 DIAGNOSIS — R7989 Other specified abnormal findings of blood chemistry: Secondary | ICD-10-CM

## 2022-06-27 DIAGNOSIS — I10 Essential (primary) hypertension: Secondary | ICD-10-CM

## 2022-06-27 DIAGNOSIS — E785 Hyperlipidemia, unspecified: Secondary | ICD-10-CM

## 2022-06-27 DIAGNOSIS — I5032 Chronic diastolic (congestive) heart failure: Secondary | ICD-10-CM

## 2022-06-27 DIAGNOSIS — Z79899 Other long term (current) drug therapy: Secondary | ICD-10-CM

## 2022-06-27 DIAGNOSIS — I480 Paroxysmal atrial fibrillation: Secondary | ICD-10-CM

## 2022-06-27 DIAGNOSIS — E1169 Type 2 diabetes mellitus with other specified complication: Secondary | ICD-10-CM

## 2022-06-27 MED ORDER — FUROSEMIDE 20 MG PO TABS: 20.0000 mg | ORAL_TABLET | Freq: Every day | ORAL | 3 refills | Status: DC

## 2022-06-27 NOTE — Progress Notes (Addendum)
Follow-up Outpatient Visit Date: 06/27/2022  Primary Care Provider: [name], MD [address] [place] [address]  Chief Complaint: Follow-up hospitalization with recent troponin elevation  HPI:  [name] is a 89 y.o. female with history of paroxysmal atrial fibrillation, hypertension, hyperlipidemia, type 2 diabetes mellitus, and recurrent syncope, who presents for follow-up of recent hospitalization for fall and elevated troponin as well as paroxysmal atrial fibrillation.  She was doing fairly well at my last visit with her a year ago.  She was hospitalized in early March after having been found on the ground next to her bed.  This was preceded by at least 1 day of dizziness, nausea, and abdominal pain she had been hospitalized a few weeks earlier for RSV infection and COPD exacerbation.  She denied chest pain.  High sensitive troponin I peaked at 964.  Presentation was not felt to be consistent with acute coronary syndrome, dose decision was made to manage her medically for possible NSTEMI.  Echocardiogram showed normal LVEF without regional wall motion abnormalities.  Following a long conversation with [name] and her daughter, we agreed that no invasive procedures would be pursued.  Today, [name] reports that she is feeling back to her baseline.  She has not had any further falls.  She denies chest pain, palpitations, and lightheadedness.  She has felt a little more bloated since returning back to assisted living.  She also has some mild intermittent leg swelling as well as intermittent wheezing.  She and her daughter wonder if she may need to try an inhaler or nebulizer, which were prescribed when she left the hospital..  Her daughter is also concerned that [name]'s upper lip appears great.  She has not had any documented hypoxic episodes.  She remains compliant with her medications and uses her as needed furosemide about 2 times a  week. --------------------------------------------------------------------------------------------------  Past Medical History:  Diagnosis Date   Acute pancreatitis 07/24/2021   Basal cell carcinoma 03/30/2008   left upper arm/excision   Basal cell carcinoma 03/31/2008   left upper arm, right lower leg   Basal cell carcinoma 01/18/2009   left upper arm   Collagenous colitis    Diabetes mellitus without complication    Diverticulitis    Elevated LFTs 07/25/2021   Heart murmur    at birth, none since   History of echocardiogram    a. 11/2015: echo showing EF of 60-65% with no WMA. Grade 1 DD and mild MR noted.    Hyperlipidemia    Per [facility]   Hypertension    Intertrochanteric fracture of right femur 06/11/2019   Lack of coordination    Per [facility] term current use of anticoagulant    Per [facility]   Noninfective gastroenteritis and colitis, unspecified    Per [facility]   PAF (paroxysmal atrial fibrillation)    a. initially diagnosed in 08/2016 --> started on Eliquis   Squamous cell carcinoma of skin 05/16/2020   Left nasal tip anterior to ala, refer to [facility]   Syncope    a. initially occurring in Fall 2016 b. Hospitalized in 10/2015 and 11/2015 for recurrent episodes.    Past Surgical History:  Procedure Laterality Date   ABDOMINAL HYSTERECTOMY     BACK SURGERY  08/08/2008   Lumbar discectomy   COLONOSCOPY WITH PROPOFOL     EXCISION/RELEASE BURSA HIP Left 05/03/2019   Procedure: HIP ABDUCTOR REPAIR;  Surgeon: Kennedy Bucker, MD;  Location: [facility]  ORS;  Service: Orthopedics;  Laterality: Left;   INTRAMEDULLARY (IM) NAIL INTERTROCHANTERIC Right 06/13/2019   Procedure: INTRAMEDULLARY (IM) NAIL INTERTROCHANTRIC;  Surgeon: Kennedy Bucker, MD;  Location: [facility] ORS;  Service: Orthopedics;  Laterality: Right;   NECK SURGERY  1980    Current Meds  Medication Sig   acetaminophen (TYLENOL) 325 MG tablet Take 650 mg by mouth every 4 (four) hours as needed for mild pain  or moderate pain.   albuterol (VENTOLIN HFA) 108 (90 Base) MCG/ACT inhaler Inhale 2 puffs into the lungs every 6 (six) hours as needed for wheezing or shortness of breath.   alum & mag hydroxide-simeth (MAALOX/MYLANTA) 200-200-20 MG/5 SUSP Give 30ml by mouth every 4 hours as needed.   apixaban (ELIQUIS) 5 MG TABS tablet Take 1 tablet (5 mg total) by mouth 2 (two) times daily.   bisacodyl (DULCOLAX) 10 MG suppository Place 10 mg rectally as needed for moderate constipation.   bismuth subsalicylate (PEPTO BISMOL) 262 MG/15ML suspension Take 30 mLs by mouth every 6 (six) hours as needed.   carbamide peroxide (DEBROX) 6.5 % OTIC solution Place 5 drops into both ears as needed.   cetirizine (ZYRTEC) 10 MG tablet Take 10 mg by mouth daily as needed for allergies.   Cholecalciferol (VITAMIN D) 50 MCG (2000 UT) CAPS Give one capsule by mouth once daily .   Clobetasol Propionate 0.05 % shampoo Apply 1 Application topically every morning. And wash out   dextromethorphan-guaiFENesin (ROBITUSSIN-DM) 10-100 MG/5ML liquid Take 10 mLs by mouth every 4 (four) hours as needed for cough.   dextrose (GLUTOSE) 40 % GEL Take 1 Tube by mouth as needed for low blood sugar.   fluocinolone (SYNALAR) 0.01 % external solution Apply 1 Application topically at bedtime. Apply to scalp   furosemide (LASIX) 20 MG tablet Take 20 mg by mouth as needed.   glipiZIDE (GLUCOTROL XL) 10 MG 24 hr tablet Take 10 mg by mouth 2 (two) times daily.   Infant Care Products [facility] EX) Apply to buttocks/periarea topically every shift   ipratropium-albuterol (DUONEB) 0.5-2.5 (3) MG/3ML SOLN Take 3 mLs by nebulization every 4 (four) hours as needed.   linagliptin (TRADJENTA) 5 MG TABS tablet Take 1 tablet (5 mg total) by mouth daily.   lisinopril (ZESTRIL) 20 MG tablet TAKE 1 TABLET BY MOUTH DAILY   magnesium hydroxide (MILK OF MAGNESIA) 400 MG/5ML suspension Take 15 mLs by mouth daily as needed for mild constipation.   metFORMIN  (GLUCOPHAGE-XR) 500 MG 24 hr tablet Take 1 tablet (500 mg total) by mouth 2 (two) times daily.   metoprolol succinate (TOPROL-XL) 25 MG 24 hr tablet TAKE 1 TABLET BY MOUTH ONCE DAILY   Multiple Vitamin (MULTIVITAMIN WITH MINERALS) TABS tablet Take 1 tablet by mouth daily.   nystatin (MYCOSTATIN/NYSTOP) powder Apply to excoriated area topically as needed Twice daily.   ondansetron (ZOFRAN) 4 MG tablet Take 4 mg by mouth every 8 (eight) hours as needed for nausea or vomiting.   OXYGEN 2lpm as needed for dyspnea/SOB   polyethylene glycol (MIRALAX / GLYCOLAX) 17 g packet Take 17 g by mouth daily as needed.    Allergies: Morphine sulfate and Penicillins  Social History   Tobacco Use   Smoking status: Former    Types: Cigarettes    Quit date: 03/09/1978    Years since quitting: 44.3   Smokeless tobacco: Never  Vaping Use   Vaping Use: Never used  Substance Use Topics   Alcohol use: Not Currently    Comment:  once a month--mixed drink   Drug use: No    Family History  Problem Relation Age of Onset   Hypertension Mother    Diabetes Mother    Lung cancer Father     Review of Systems: A 12-system review of systems was performed and was negative except as noted in the HPI.  --------------------------------------------------------------------------------------------------  Physical Exam: BP (!) 140/68   Pulse 72   Ht 5\' 2"  (1.575 m)   Wt 170 lb 3.2 oz (77.2 kg)   SpO2 98%   BMI 31.13 kg/m   General:  NAD. Neck: No JVD or HJR. Lungs: Coarse breath sounds with scattered rhonchi.  No wheezes or crackles. Heart: Regular rate and rhythm without murmurs, rubs, or gallops. Abdomen: Soft, nontender, nondistended. Extremities: Trace pretibial edema bilaterally  EKG: Normal sinus rhythm with sinus arrhythmia.  Otherwise, no significant abnormalities.  Lab Results  Component Value Date   WBC 8.3 05/14/2022   HGB 11.5 (L) 05/14/2022   HCT 34.8 (L) 05/14/2022   MCV 88.3 05/14/2022    PLT 241 05/14/2022    Lab Results  Component Value Date   NA 137 05/14/2022   K 3.7 05/14/2022   CL 104 05/14/2022   CO2 24 05/14/2022   BUN 19 05/14/2022   CREATININE 0.78 05/14/2022   GLUCOSE 231 (H) 05/14/2022   ALT 169 (H) 05/14/2022    Lab Results  Component Value Date   CHOL 198 05/10/2022   HDL 38 (L) 05/10/2022   LDLCALC 118 (H) 05/10/2022   TRIG 208 (H) 05/10/2022   CHOLHDL 5.2 05/10/2022    --------------------------------------------------------------------------------------------------  ASSESSMENT AND PLAN: Chronic HFpEF: [name] appears fairly euvolemic on exam though she feels a little bit more short of breath and also complains of some abdominal distention and leg swelling.  Her wheezing and dyspnea are likely multifactorial but could be a sign of some volume overload.  I have stressed the importance of sodium restriction and have asked her to begin taking furosemide 20 mg on a daily basis (rather than as needed).  We will draw a CMP in about 2 weeks to ensure stable renal function and potassium.  Elevated troponin: Recent troponin elevation during hospitalization for fall could have been due to type I or type II MI.  However, I suspect it was more likely a type II event (supply-demand mismatch) given her lack of chest pain as well as normal LVEF without wall motion abnormalities.  We will defer additional ischemia testing given [name]'s advanced age and desire to avoid any invasive procedures.  She should be on a statin, though this was previously held due to abnormal LFTs during recent hospitalization.  This should be readdressed at her follow-up visit; draw CMP in 2 weeks.  Paroxysmal atrial fibrillation: EKG today demonstrates sinus rhythm.  No palpitations reported to suggest recurrent atrial fibrillation.  Continue apixaban 5 mg twice daily for stroke prevention as well as current dose of metoprolol for rate control.  Hypertension: Blood pressure  mildly elevated today.  However, in the setting of falls, we will tolerate a slight degree of permissive hypertension.  No medication changes today aside from scheduled furosemide.  This may help lower her blood pressure a little bit.  Hyperlipidemia associated with type 2 diabetes mellitus: Lipid suboptimal during recent hospitalization, though statin was held at that time due to transaminitis.  Consider reinitiation of statin (ideally high intensity statin) for target LDL less than 70 at follow-up.  We will draw CMP in  about 2 weeks to reassess transaminitis  Follow-up: Return to clinic in 1 month.  Yvonne Kendall, MD 06/27/2022 9:52 AM

## 2022-06-27 NOTE — Patient Instructions (Signed)
Medication Instructions:  INCREASE: Lasix 20 mg by mouth daily  *If you need a refill on your cardiac medications before your next appointment, please call your pharmacy*   Lab Work: Your provider would like for you to return in 2 weeks to have the following labs drawn: (BMP).   Please go to the [facility] entrance and check in at the front desk.  You do not need an appointment.  They are open from 7am-6 pm.  You will not need to be fasting.   If you have labs (blood work) drawn today and your tests are completely normal, you will receive your results only by: MyChart Message (if you have MyChart) OR A paper copy in the mail If you have any lab test that is abnormal or we need to change your treatment, we will call you to review the results.   Testing/Procedures: None ordered    Follow-Up: At [facility], you and your health needs are our priority.  As part of our continuing mission to provide you with exceptional heart care, we have created designated Provider Care Teams.  These Care Teams include your primary Cardiologist (physician) and Advanced Practice Providers (APPs -  Physician Assistants and Nurse Practitioners) who all work together to provide you with the care you need, when you need it.  We recommend signing up for the patient portal called "MyChart".  Sign up information is provided on this After Visit Summary.  MyChart is used to connect with patients for Virtual Visits (Telemedicine).  Patients are able to view lab/test results, encounter notes, upcoming appointments, etc.  Non-urgent messages can be sent to your provider as well.   To learn more about what you can do with MyChart, go to [email or web address].    Your next appointment:   1 month(s)  Provider:   You may see Yvonne Kendall, MD or one of the following Advanced Practice Providers on your designated Care Team:   Nicolasa Ducking, NP Eula Listen, PA-C Cadence Fransico Michael, PA-C Charlsie Quest, NP

## 2022-06-28 ENCOUNTER — Encounter: Payer: Self-pay | Admitting: Internal Medicine

## 2022-06-28 DIAGNOSIS — I5032 Chronic diastolic (congestive) heart failure: Secondary | ICD-10-CM | POA: Insufficient documentation

## 2022-06-28 NOTE — Addendum Note (Signed)
Addended by: Rohaan Durnil A on: 06/28/2022 08:52 PM   Modules accepted: Orders

## 2022-07-24 LAB — BASIC METABOLIC PANEL
BUN: 23 — AB (ref 4–21)
CO2: 27 — AB (ref 13–22)
Chloride: 106 (ref 99–108)
Creatinine: 0.9 (ref 0.5–1.1)
Glucose: 105
Potassium: 4.5 mEq/L (ref 3.5–5.1)
Sodium: 140 (ref 137–147)

## 2022-07-24 LAB — COMPREHENSIVE METABOLIC PANEL: Calcium: 9.2 (ref 8.7–10.7)

## 2022-07-29 ENCOUNTER — Ambulatory Visit: Payer: Medicare Other | Admitting: Physician Assistant

## 2022-08-06 ENCOUNTER — Non-Acute Institutional Stay: Payer: Medicare Other | Admitting: Nurse Practitioner

## 2022-08-06 ENCOUNTER — Encounter: Payer: Self-pay | Admitting: Nurse Practitioner

## 2022-08-06 DIAGNOSIS — J432 Centrilobular emphysema: Secondary | ICD-10-CM

## 2022-08-06 DIAGNOSIS — R7989 Other specified abnormal findings of blood chemistry: Secondary | ICD-10-CM

## 2022-08-06 DIAGNOSIS — D6869 Other thrombophilia: Secondary | ICD-10-CM

## 2022-08-06 DIAGNOSIS — E785 Hyperlipidemia, unspecified: Secondary | ICD-10-CM

## 2022-08-06 DIAGNOSIS — I10 Essential (primary) hypertension: Secondary | ICD-10-CM | POA: Diagnosis not present

## 2022-08-06 DIAGNOSIS — I480 Paroxysmal atrial fibrillation: Secondary | ICD-10-CM | POA: Diagnosis not present

## 2022-08-06 DIAGNOSIS — E1159 Type 2 diabetes mellitus with other circulatory complications: Secondary | ICD-10-CM | POA: Diagnosis not present

## 2022-08-06 DIAGNOSIS — Z66 Do not resuscitate: Secondary | ICD-10-CM

## 2022-08-06 NOTE — Progress Notes (Unsigned)
Cardiology Office Note    Date:  08/07/2022   ID:  [name], DOB [birth year], MRN [id number]  PCP:  [name], MD  Cardiologist:  Yvonne Kendall, MD  Electrophysiologist:  None   Chief Complaint: Follow-up  History of Present Illness:   [name] is a 89 y.o. female with history of HFpEF, PAF diagnosed in 2018, elevated troponin felt to be type II MI (supply/demand mismatch ischemia) in 3/24, recurrent syncope felt to be vasovagal in etiology, DM2, HTN, and HLD who presents for follow-up of HFpEF.  She was admitted to the hospital in 05/2022 after having been found on the ground next to her bed.  This was preceded by at least 1 day of dizziness, nausea, abdominal pain following a prior admission a few weeks earlier for RSV and COPD exacerbation.  She denied chest pain.  High-sensitivity troponin peaked at 964.  Presentation was not felt to be consistent with ACS.  Echo showed normal LVEF without regional wall motion abnormalities.  Following discussion with patient and her daughter, no invasive procedures were pursued.  She was seen in hospital follow-up on 06/27/2022 and was feeling back to her baseline.  She had not had any further falls.  She was without symptoms of angina or cardiac decompensation.  She did feel somewhat bloated and noted some mild intermittent lower extremity swelling and wheezing following return to assisted living.  It was recommended she take furosemide 20 mg daily rather than as needed and follow-up today.  She comes in accompanied by her daughter today and is without symptoms of angina or cardiac decompensation.  No palpitations, dizziness, presyncope, or syncope.  She did feel like daily furosemide improved her lower extremity swelling a little.  Otherwise, she did not feel any different with daily furosemide.  At baseline, she drinks a cup of coffee for breakfast, and lunch, dinner, and before bed with largely no fluid intake throughout the day.   Her daughter also notes daily loose stools.  She does not add salt to some food.  No orthopnea or early satiety.  Her weight is down 2 pounds today when compared to her visit in 06/2022.   Labs independently reviewed: 07/2022 - BUN 23, serum creatinine 0.9, potassium 4.5 05/2022 - albumin 2.6, AST normal, ALT 169, Hgb 11.5, PLT 241, TC 198, TG 208, HDL 38, LDL 118, magnesium 1.6 04/2022 - TSH normal, A1c 8.3  Past Medical History:  Diagnosis Date   Acute pancreatitis 07/24/2021   Basal cell carcinoma 03/30/2008   left upper arm/excision   Basal cell carcinoma 03/31/2008   left upper arm, right lower leg   Basal cell carcinoma 01/18/2009   left upper arm   Collagenous colitis    Diabetes mellitus without complication (HCC)    Diverticulitis    Elevated LFTs 07/25/2021   Heart murmur    at birth, none since   History of echocardiogram    a. 11/2015: echo showing EF of 60-65% with no WMA. Grade 1 DD and mild MR noted.    Hyperlipidemia    Per [facility]   Hypertension    Intertrochanteric fracture of right femur (HCC) 06/11/2019   Lack of coordination    Per [facility] term current use of anticoagulant    Per [facility]   Noninfective gastroenteritis and colitis, unspecified    Per [facility]   PAF (paroxysmal atrial fibrillation) (HCC)    a. initially diagnosed in 08/2016 --> started on  Eliquis   Squamous cell carcinoma of skin 05/16/2020   Left nasal tip anterior to ala, refer to [facility]   Syncope    a. initially occurring in Fall 2016 b. Hospitalized in 10/2015 and 11/2015 for recurrent episodes.     Past Surgical History:  Procedure Laterality Date   ABDOMINAL HYSTERECTOMY     BACK SURGERY  08/08/2008   Lumbar discectomy   COLONOSCOPY WITH PROPOFOL     EXCISION/RELEASE BURSA HIP Left 05/03/2019   Procedure: HIP ABDUCTOR REPAIR;  Surgeon: Kennedy Bucker, MD;  Location: [facility] ORS;  Service: Orthopedics;  Laterality: Left;   INTRAMEDULLARY (IM) NAIL INTERTROCHANTERIC  Right 06/13/2019   Procedure: INTRAMEDULLARY (IM) NAIL INTERTROCHANTRIC;  Surgeon: Kennedy Bucker, MD;  Location: [facility] ORS;  Service: Orthopedics;  Laterality: Right;   NECK SURGERY  1980    Current Medications: Current Meds  Medication Sig   acetaminophen (TYLENOL) 325 MG tablet Take 650 mg by mouth every 4 (four) hours as needed for mild pain or moderate pain.   alum & mag hydroxide-simeth (MAALOX/MYLANTA) 200-200-20 MG/5 SUSP Give 30ml by mouth every 4 hours as needed.   apixaban (ELIQUIS) 5 MG TABS tablet Take 1 tablet (5 mg total) by mouth 2 (two) times daily.   Cholecalciferol (VITAMIN D) 50 MCG (2000 UT) CAPS Give one capsule by mouth once daily .   Clobetasol Propionate 0.05 % shampoo Apply 1 Application topically every morning. And wash out   glipiZIDE (GLUCOTROL XL) 10 MG 24 hr tablet Take 10 mg by mouth 2 (two) times daily.   linagliptin (TRADJENTA) 5 MG TABS tablet Take 1 tablet (5 mg total) by mouth daily.   lisinopril (ZESTRIL) 20 MG tablet TAKE 1 TABLET BY MOUTH DAILY   metFORMIN (GLUCOPHAGE-XR) 500 MG 24 hr tablet Take 1 tablet (500 mg total) by mouth 2 (two) times daily.   metoprolol succinate (TOPROL-XL) 25 MG 24 hr tablet TAKE 1 TABLET BY MOUTH ONCE DAILY   Multiple Vitamin (MULTIVITAMIN WITH MINERALS) TABS tablet Take 1 tablet by mouth daily.    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  Tobacco Use   Smoking status: Former    Types: Cigarettes    Quit date: 03/09/1978    Years since quitting: 44.4   Smokeless tobacco: Never  Vaping Use   Vaping Use: Never used  Substance and Sexual Activity   Alcohol use: Not Currently    Comment: once a month--mixed drink   Drug use: No   Sexual  activity: Not on file  Other Topics Concern   Not on file  Social History Narrative   2 daughters and a son (1 local plus [place] and [place])      Has living will    Daughter Eunice Blase is [facility] POA   Has DNR   No feeding tube   Social Determinants of Health   Financial Resource Strain: Low Risk  (04/25/2020)   Overall Financial Resource Strain (CARDIA)    Difficulty of Paying Living Expenses: Not hard at all  Food Insecurity: No Food Insecurity (04/25/2020)   Hunger Vital Sign    Worried About Running Out of Food in the Last Year: Never true  Ran Out of Food in the Last Year: Never true  Transportation Needs: No Transportation Needs (05/11/2022)   PRAPARE - Administrator, Civil Service (Medical): No    Lack of Transportation (Non-Medical): No  Physical Activity: Inactive (04/25/2020)   Exercise Vital Sign    Days of Exercise per Week: 0 days    Minutes of Exercise per Session: 0 min  Stress: No Stress Concern Present (04/25/2020)   [employer] of Occupational Health - Occupational Stress Questionnaire    Feeling of Stress : Not at all  Social Connections: Moderately Isolated (04/25/2020)   Social Connection and Isolation Panel [NHANES]    Frequency of Communication with Friends and Family: More than three times a week    Frequency of Social Gatherings with Friends and Family: More than three times a week    Attends Religious Services: More than 4 times per year    Active Member of [employer] or Organizations: No    Attends Banker Meetings: Never    Marital Status: Widowed     Family History:  The patient's family history includes Diabetes in her mother; Hypertension in her mother; Lung cancer in her father.  ROS:   12-point review of systems is negative unless otherwise noted in the HPI.   EKGs/Labs/Other Studies Reviewed:    Studies reviewed were summarized above. The additional studies were reviewed today:  2D echo 05/10/2022: 1. Left  ventricular ejection fraction, by estimation, is 55 to 60%. The  left ventricle has normal function. The left ventricle has no regional  wall motion abnormalities. There is mild left ventricular hypertrophy.  Left ventricular diastolic parameters  are indeterminate.   2. Right ventricular systolic function is normal. The right ventricular  size is normal.   3. Right atrial size was mildly dilated.   4. The mitral valve is normal in structure. No evidence of mitral valve  regurgitation.   5. The aortic valve was not well visualized. Aortic valve regurgitation  is not visualized. No aortic stenosis is present.   6. The inferior vena cava is normal in size with greater than 50%  respiratory variability, suggesting right atrial pressure of 3 mmHg.  __________  2D echo 12/18/2020: 1. Left ventricular ejection fraction, by estimation, is 55 to 60%. The  left ventricle has normal function. The left ventricle has no regional  wall motion abnormalities. Left ventricular diastolic parameters are  consistent with Grade I diastolic  dysfunction (impaired relaxation).   2. Right ventricular systolic function is normal. The right ventricular  size is normal.   3. The mitral valve is normal in structure. No evidence of mitral valve  regurgitation.   4. The aortic valve was not well visualized. Aortic valve regurgitation  is not visualized.   5. The inferior vena cava is normal in size with greater than 50%  respiratory variability, suggesting right atrial pressure of 3 mmHg.   Comparison(s): LVEF 55-60%.  __________  Eugenie Birks MPI 10/26/2019: Pharmacological myocardial perfusion imaging study with no significant  ischemia Normal wall motion, EF estimated at 61% No EKG changes concerning for ischemia at peak stress or in recovery. CT attenuation correction images with mild coronary calcification and aortic atherosclerosis Low risk scan ___________  Limited echo 06/14/2019: 1. Left ventricular  ejection fraction, by estimation, is 55 to 60%. The  left ventricle has normal function. The left ventricle demonstrates  regional wall motion abnormalities (see scoring diagram/findings for  description). There is mild left ventricular  hypertrophy. Left ventricular diastolic parameters are consistent with  Grade I diastolic dysfunction (impaired relaxation). There is mild  hypokinesis of the left ventricular, basal-mid inferior wall and  inferoseptal wall.   2. There is moderately elevated pulmonary artery systolic pressure.  __________  2D echo 04/21/2019: 1. Left ventricular ejection fraction, by estimation, is 55 to 60%. The  left ventricle has normal function. The left ventricle has no regional  wall motion abnormalities. Left ventricular diastolic parameters are  consistent with Grade I diastolic  dysfunction (impaired relaxation).   2. Right ventricular systolic function is normal. The right ventricular  size is normal.  __________  Eugenie Birks MPI 01/08/2016: No significant ischemia. Small apical anterior and apical defect is present, more pronounced on ther rest images. This most likely represents shifting breast attenuation but cannot rule out small area of scar. The left ventricular ejection fraction is normal (55-65%). Study is degraded by extracardiac activity and motion. This is a low risk study. __________  Event monitor 11/2015: Monitoring period was 30 days. The predominant rhythm was sinus with an average heart rate of 68 bpm (range 45 -134 bpm). Occasional PAC's and PVC's were noted. Two episodes of supraventricular tachycardia were identified, lasting up to 14 beats with a maximum rate of 136 bpm. No sustained arrhythmias were identified. Reported symptoms of "tired or fatigued" corresponded to sinus rhythm and sinus rhythm with PAC's __________  Carotid artery ultrasound 11/22/2015: IMPRESSION: No hemodynamically significant stenosis or plaque is noted in  either cervical carotid artery. __________  2D echo 11/21/2015: - Left ventricle: The cavity size was normal. Systolic function was    normal. The estimated ejection fraction was in the range of 60%    to 65%. Wall motion was normal; there were no regional wall    motion abnormalities. Doppler parameters are consistent with    abnormal left ventricular relaxation (grade 1 diastolic    dysfunction).  - Mitral valve: There was mild regurgitation.  - Left atrium: The atrium was normal in size.  - Right ventricle: Systolic function was normal.  - Pulmonary arteries: Systolic pressure was within the normal    range.   Impressions:   - Normal study.    EKG:  EKG is ordered today.  The EKG ordered today demonstrates sinus bradycardia, 57 bpm, no acute ST-T changes  Recent Labs: 04/17/2022: TSH 2.284 05/10/2022: Magnesium 1.6 05/14/2022: ALT 169; Hemoglobin 11.5; Platelets 241 07/24/2022: BUN 23; Creatinine 0.9; Potassium 4.5; Sodium 140  Recent Lipid Panel    Component Value Date/Time   CHOL 198 05/10/2022 0836   CHOL 258 (H) 04/29/2021 1300   TRIG 208 (H) 05/10/2022 0836   HDL 38 (L) 05/10/2022 0836   HDL 38 (L) 04/29/2021 1300   CHOLHDL 5.2 05/10/2022 0836   VLDL 42 (H) 05/10/2022 0836   LDLCALC 118 (H) 05/10/2022 0836   LDLCALC 146 (H) 04/29/2021 1300    PHYSICAL EXAM:    VS:  BP 120/60 (BP Location: Left Arm, Patient Position: Sitting, Cuff Size: Normal)   Pulse (!) 57   Ht 5' (1.524 m)   Wt 168 lb (76.2 kg)   SpO2 97%   BMI 32.81 kg/m   BMI: Body mass index is 32.81 kg/m.  Physical Exam Vitals reviewed.  Constitutional:      Appearance: She is well-developed.  HENT:     Head: Normocephalic and atraumatic.  Eyes:     General:        Right eye: No discharge.  Left eye: No discharge.  Neck:     Vascular: No JVD.  Cardiovascular:     Rate and Rhythm: Regular rhythm. Bradycardia present.     Heart sounds: Normal heart sounds, S1 normal and S2 normal. Heart  sounds not distant. No midsystolic click and no opening snap. No murmur heard.    No friction rub.  Pulmonary:     Effort: Pulmonary effort is normal. No respiratory distress.     Breath sounds: Normal breath sounds. No decreased breath sounds, wheezing or rales.  Chest:     Chest wall: No tenderness.  Abdominal:     General: There is no distension.     Palpations: Abdomen is soft.     Tenderness: There is no abdominal tenderness.  Musculoskeletal:     Cervical back: Normal range of motion.     Comments: Trivial pretibial lower extremity swelling bilaterally.  Skin:    General: Skin is warm and dry.     Nails: There is no clubbing.  Neurological:     Mental Status: She is alert and oriented to person, place, and time.  Psychiatric:        Speech: Speech normal.        Behavior: Behavior normal.        Thought Content: Thought content normal.        Judgment: Judgment normal.     Wt Readings from Last 3 Encounters:  08/07/22 168 lb (76.2 kg)  08/06/22 196 lb (88.9 kg)  06/27/22 170 lb 3.2 oz (77.2 kg)     ASSESSMENT & PLAN:   HFpEF: Euvolemic and well compensated.  She has minimal p.o. fluid intake throughout the day and has also been experiencing volume depletion through GI loss with diarrhea.  Updated labs did show slight uptrend in serum creatinine.  Given these factors, we will reduce furosemide to 20 mg every other day (for ease of dosing, over as needed dosing) with a follow-up BMP to be obtained in 1 week.  Sodium restriction and leg elevation are encouraged.  Elevated troponin/HLD: Favored to be a type II event (supply/demand mismatch ischemia) given lack of chest pain and a normal LVEF without wall motion abnormality on echo.  Additional ischemic testing has been deferred given the patient's advanced age and her desire to avoid any invasive procedures.  Statin was previously held with elevated LFTs.  She reports labs were drawn earlier today, it is unclear if these  included LFTs.  If LFTs have improved, would recommend reinitiation of statin.  This was deferred at this time her diarrhea is further evaluated by PCP.  PAF: Maintaining sinus rhythm with a mildly bradycardic rate.  She remains on Toprol-XL 55 mg.  CHA2DS2-VASc at least 6 (CHF, HTN, age x 2, DM2, sex category).  She remains on apixaban 5 mg twice daily and does not meet reduced dosing criteria.  No bleeding concerns.  HTN: Blood pressure is well-controlled in the office today.  Continue current medical therapy.    Disposition: F/u with Dr. Okey Dupre or an APP in 2 months.   Medication Adjustments/Labs and Tests Ordered: Current medicines are reviewed at length with the patient today.  Concerns regarding medicines are outlined above. Medication changes, Labs and Tests ordered today are summarized above and listed in the Patient Instructions accessible in Encounters.   Signed, Eula Listen, PA-C 08/07/2022 1:02 PM     [facility] [place] [address] [place], [place] [address] [phone]

## 2022-08-06 NOTE — Progress Notes (Unsigned)
Location:  Other [facility]) Nursing Home Room Number: 302 L Place of Service:  ALF (13)  Quinlee Sciarra K. Janyth Contes, NP    Patient Care Team: [name], MD as PCP - General (Family Medicine) End, Cristal Deer, MD as PCP - Cardiology (Cardiology) Lockie Mola, MD as Referring Physician (Ophthalmology) Sharon Seller, NP as Nurse Practitioner (Geriatric Medicine)  Extended Emergency Contact Information Primary Emergency Contact: [name] Address: [address]          [place], [place] [address] [name] of [place] Home Phone: [phone] Work Phone: [phone] Relation: Daughter  Goals of care: Advanced Directive information    08/06/2022    8:55 AM  Advanced Directives  Does Patient Have a Medical Advance Directive? Yes  Type of Advance Directive Out of facility DNR (pink MOST or yellow form)  Does patient want to make changes to medical advance directive? No - Patient declined  Pre-existing out of facility DNR order (yellow form or pink MOST form) Yellow form placed in chart (order not valid for inpatient use)     Chief Complaint  Patient presents with   Medical Management of Chronic Issues    Routine visit. Discuss need for shingrix, eye exam, foot exam, AWV, and covid boosters or post pone if patient refuses or is not a candidate.     HPI:  Pt is a 89 y.o. female seen today for medical management of chronic disease.  Pt is resident at assisted living at [facility].  She had NSTEMI in March, she had fallen out of bed and it was found during hospitalization. She states she does not have any chest pains. Followed by cardiology due to a fib and CHF. No shortness of breath, LE edema.   DM- she reports staff checks blood sugars, no hypoglycemia   Occasionally headaches, tylenol controls    Past Medical History:  Diagnosis Date   Acute pancreatitis 07/24/2021   Basal cell carcinoma 03/30/2008   left upper arm/excision   Basal cell carcinoma  03/31/2008   left upper arm, right lower leg   Basal cell carcinoma 01/18/2009   left upper arm   Collagenous colitis    Diabetes mellitus without complication (HCC)    Diverticulitis    Elevated LFTs 07/25/2021   Heart murmur    at birth, none since   History of echocardiogram    a. 11/2015: echo showing EF of 60-65% with no WMA. Grade 1 DD and mild MR noted.    Hyperlipidemia    Per [facility]   Hypertension    Intertrochanteric fracture of right femur (HCC) 06/11/2019   Lack of coordination    Per [facility] term current use of anticoagulant    Per [facility]   Noninfective gastroenteritis and colitis, unspecified    Per [facility]   PAF (paroxysmal atrial fibrillation) (HCC)    a. initially diagnosed in 08/2016 --> started on Eliquis   Squamous cell carcinoma of skin 05/16/2020   Left nasal tip anterior to ala, refer to [facility]   Syncope    a. initially occurring in Fall 2016 b. Hospitalized in 10/2015 and 11/2015 for recurrent episodes.    Past Surgical History:  Procedure Laterality Date   ABDOMINAL HYSTERECTOMY     BACK SURGERY  08/08/2008   Lumbar discectomy   COLONOSCOPY WITH PROPOFOL     EXCISION/RELEASE BURSA HIP Left 05/03/2019   Procedure: HIP ABDUCTOR REPAIR;  Surgeon: Kennedy Bucker, MD;  Location: [facility] ORS;  Service:  Orthopedics;  Laterality: Left;   INTRAMEDULLARY (IM) NAIL INTERTROCHANTERIC Right 06/13/2019   Procedure: INTRAMEDULLARY (IM) NAIL INTERTROCHANTRIC;  Surgeon: Kennedy Bucker, MD;  Location: [facility] ORS;  Service: Orthopedics;  Laterality: Right;   NECK SURGERY  1980    Allergies  Allergen Reactions   Morphine Sulfate Other (See Comments)    BP bottoms out   Penicillins Diarrhea    Has patient had a PCN reaction causing immediate rash, facial/tongue/throat swelling, SOB or lightheadedness with hypotension: Yes Has patient had a PCN reaction causing severe rash involving mucus membranes or skin necrosis: Yes Has patient had a PCN reaction that  required hospitalization No Has patient had a PCN reaction occurring within the last 10 years: No If all of the above answers are "NO", then may proceed with Cephalosporin use.    Outpatient Encounter Medications as of 08/06/2022  Medication Sig   acetaminophen (TYLENOL) 325 MG tablet Take 650 mg by mouth every 4 (four) hours as needed for mild pain or moderate pain.   albuterol (VENTOLIN HFA) 108 (90 Base) MCG/ACT inhaler Inhale 2 puffs into the lungs every 6 (six) hours as needed for wheezing or shortness of breath.   alum & mag hydroxide-simeth (MAALOX/MYLANTA) 200-200-20 MG/5 SUSP Give 30ml by mouth every 4 hours as needed.   apixaban (ELIQUIS) 5 MG TABS tablet Take 1 tablet (5 mg total) by mouth 2 (two) times daily.   benzonatate (TESSALON PERLES) 100 MG capsule Take 1 capsule (100 mg total) by mouth 3 (three) times daily as needed for cough.   bisacodyl (DULCOLAX) 10 MG suppository Place 10 mg rectally as needed for moderate constipation.   bismuth subsalicylate (PEPTO BISMOL) 262 MG/15ML suspension Give 10 cc by mouth as needed for loose stool supervised self-administration   carbamide peroxide (DEBROX) 6.5 % OTIC solution Place 5 drops into both ears as needed.   cetirizine (ZYRTEC) 10 MG tablet Take 10 mg by mouth daily as needed for allergies.   Cholecalciferol (VITAMIN D) 50 MCG (2000 UT) CAPS Give one capsule by mouth once daily .   Clobetasol Propionate 0.05 % shampoo Apply 1 Application topically every morning. And wash out   dextromethorphan-guaiFENesin (ROBITUSSIN-DM) 10-100 MG/5ML liquid Give 2 tsp by mouth every 4 hours as needed for coughing. supervised self-administration Notify MD if no relief in 3 days or continued use   dextrose (GLUTOSE) 40 % GEL Take 1 Tube by mouth as needed for low blood sugar.   fluocinolone (SYNALAR) 0.01 % external solution Apply 1 Application topically at bedtime. Apply to scalp   furosemide (LASIX) 20 MG tablet Take 1 tablet (20 mg total) by  mouth daily.   glipiZIDE (GLUCOTROL XL) 10 MG 24 hr tablet Take 10 mg by mouth 2 (two) times daily.   Infant Care Products [facility] EX) Apply to buttocks/periarea topically every shift   ipratropium-albuterol (DUONEB) 0.5-2.5 (3) MG/3ML SOLN Take 3 mLs by nebulization every 4 (four) hours as needed.   linagliptin (TRADJENTA) 5 MG TABS tablet Take 1 tablet (5 mg total) by mouth daily.   lisinopril (ZESTRIL) 20 MG tablet TAKE 1 TABLET BY MOUTH DAILY   magnesium hydroxide (MILK OF MAGNESIA) 400 MG/5ML suspension Give 2 Tbsp by mouth as needed for constipation. supervised self-administration daily -- Call MD if no relief in 3 days of continued use   metFORMIN (GLUCOPHAGE-XR) 500 MG 24 hr tablet Take 1 tablet (500 mg total) by mouth 2 (two) times daily.   metoprolol succinate (TOPROL-XL) 25 MG 24 hr tablet  TAKE 1 TABLET BY MOUTH ONCE DAILY   Multiple Vitamin (MULTIVITAMIN WITH MINERALS) TABS tablet Take 1 tablet by mouth daily.   nystatin (MYCOSTATIN/NYSTOP) powder Apply to excoriated area topically as needed Twice daily.   ondansetron (ZOFRAN) 4 MG tablet Take 4 mg by mouth every 8 (eight) hours as needed for nausea or vomiting.   OXYGEN 2lpm as needed for dyspnea/SOB   polyethylene glycol (MIRALAX / GLYCOLAX) 17 g packet Take 17 g by mouth daily as needed.   [DISCONTINUED] bismuth subsalicylate (PEPTO BISMOL) 262 MG/15ML suspension Take 30 mLs by mouth every 6 (six) hours as needed.   No facility-administered encounter medications on file as of 08/06/2022.    Review of Systems  Constitutional:  Negative for activity change, appetite change, fatigue and unexpected weight change.  HENT:  Negative for congestion and hearing loss.   Eyes: Negative.   Respiratory:  Negative for cough and shortness of breath.   Cardiovascular:  Negative for chest pain, palpitations and leg swelling.  Gastrointestinal:  Negative for abdominal pain, constipation and diarrhea.  Genitourinary:  Negative for  difficulty urinating and dysuria.  Musculoskeletal:  Negative for arthralgias and myalgias.  Skin:  Negative for color change and wound.  Neurological:  Negative for dizziness and weakness.  Psychiatric/Behavioral:  Negative for agitation, behavioral problems and confusion.      Immunization History  Administered Date(s) Administered   Fluad Quad(high Dose 65+) 12/20/2018   Influenza Split 01/01/2009, 01/02/2010, 01/13/2011, 12/22/2011   Influenza, High Dose Seasonal PF 01/18/2014, 01/01/2015, 11/30/2015, 12/01/2016, 01/11/2018, 12/08/2019   Influenza-Unspecified 01/08/2013, 11/08/2020, 12/25/2021   Moderna Covid-19 Vaccine Bivalent Booster 95yrs & up 11/30/2020, 08/06/2021, 01/17/2022   PFIZER Comirnaty(Gray Top)Covid-19 Tri-Sucrose Vaccine 07/27/2020   PFIZER(Purple Top)SARS-COV-2 Vaccination 04/01/2019, 04/22/2019, 12/05/2019   Pfizer Covid-19 Vaccine Bivalent Booster 65yrs & up 11/30/2020   Pneumococcal Conjugate-13 10/19/2013   Pneumococcal Polysaccharide-23 01/02/2010   Td 11/02/2003, 01/13/2011   Tdap 01/13/2011, 02/28/2015   Zoster, Live 07/14/2007   Pertinent  Health Maintenance Due  Topic Date Due   OPHTHALMOLOGY EXAM  06/08/2021   FOOT EXAM  04/29/2022   INFLUENZA VACCINE  10/09/2022   HEMOGLOBIN A1C  10/16/2022   DEXA SCAN  Completed      10/19/2020   11:04 AM 07/24/2021    4:09 PM 07/24/2021   10:50 PM 07/25/2021   10:02 AM 07/26/2021    1:00 AM  Fall Risk  Falls in the past year? 0      Was there an injury with Fall? 0      Fall Risk Category Calculator 0      Fall Risk Category (Retired) Low      (RETIRED) Patient Fall Risk Level Low fall risk Moderate fall risk High fall risk High fall risk High fall risk  Patient at Risk for Falls Due to Impaired balance/gait      Fall risk Follow up Falls evaluation completed       Functional Status Survey:    Vitals:   08/06/22 0853  BP: 127/79  Pulse: 65  SpO2: 98%  Weight: 196 lb (88.9 kg)  Height: 5\' 2"  (1.575  m)   Body mass index is 35.85 kg/m. Physical Exam Constitutional:      General: She is not in acute distress.    Appearance: She is well-developed. She is not diaphoretic.  HENT:     Head: Normocephalic and atraumatic.     Mouth/Throat:     Pharynx: No oropharyngeal exudate.  Eyes:  Conjunctiva/sclera: Conjunctivae normal.     Pupils: Pupils are equal, round, and reactive to light.  Cardiovascular:     Rate and Rhythm: Normal rate and regular rhythm.     Heart sounds: Normal heart sounds.  Pulmonary:     Effort: Pulmonary effort is normal.     Breath sounds: Normal breath sounds.  Abdominal:     General: Bowel sounds are normal.     Palpations: Abdomen is soft.  Musculoskeletal:     Cervical back: Normal range of motion and neck supple.     Right lower leg: No edema.     Left lower leg: No edema.  Skin:    General: Skin is warm and dry.  Neurological:     Mental Status: She is alert.  Psychiatric:        Mood and Affect: Mood normal.     Labs reviewed: Recent Labs    05/10/22 0104 05/11/22 0313 05/12/22 0533 05/13/22 0704 05/14/22 0351 07/24/22 0000  NA 136   < > 137 134* 137 140  K 3.4*   < > 3.6 3.7 3.7 4.5  CL 103   < > 104 105 104 106  CO2 24   < > 23 23 24  27*  GLUCOSE 246*   < > 205* 203* 231*  --   BUN 24*   < > 13 14 19  23*  CREATININE 0.79   < > 0.66 0.72 0.78 0.9  CALCIUM 8.7*   < > 8.5* 8.5* 8.6* 9.2  MG 1.6*  --   --   --   --   --    < > = values in this interval not displayed.   Recent Labs    05/12/22 0533 05/13/22 0704 05/14/22 0351  AST 162* 67* 40  ALT 308* 219* 169*  ALKPHOS 122 142* 136*  BILITOT 1.2 0.9 0.6  PROT 5.4* 5.5* 5.7*  ALBUMIN 2.5* 2.6* 2.6*   Recent Labs    05/12/22 0533 05/13/22 0704 05/14/22 0351  WBC 6.1 8.3 8.3  NEUTROABS 3.0 3.7 3.1  HGB 11.4* 11.8* 11.5*  HCT 34.9* 35.9* 34.8*  MCV 89.0 89.8 88.3  PLT 205 223 241   Lab Results  Component Value Date   TSH 2.284 04/17/2022   Lab Results   Component Value Date   HGBA1C 8.3 (H) 04/17/2022   Lab Results  Component Value Date   CHOL 198 05/10/2022   HDL 38 (L) 05/10/2022   LDLCALC 118 (H) 05/10/2022   TRIG 208 (H) 05/10/2022   CHOLHDL 5.2 05/10/2022    Significant Diagnostic Results in last 30 days:  No results found.  Assessment/Plan 1. DNR (do not resuscitate) - Do not attempt resuscitation (DNR)  2. Essential hypertension Blood pressure well controlled, goal bp <140/90 Continue current medications and dietary modifications follow metabolic panel  3. Acquired thrombophilia (HCC) -due to a fib, continues on eliquis.   4. Paroxysmal atrial fibrillation (HCC) -rate controlled on metoprolol  5. Type 2 diabetes mellitus with other circulatory complication, without long-term current use of insulin (HCC) -Encouraged dietary compliance, routine foot care/monitoring and to keep up with diabetic eye exams through ophthalmology  -continues current regimen will follow up A1c  6. Dyslipidemia -statin was held due to elevated LFT, does not look like these labs were repeated, will follow up LFT at this time and needs to be on statin  7. Centrilobular emphysema (HCC) -stable at this time  8. Elevated LFT -follow up CMP   Shanda Bumps  Idolina Primer, Juel Burrow [facility] [phone]

## 2022-08-07 ENCOUNTER — Encounter: Payer: Self-pay | Admitting: Physician Assistant

## 2022-08-07 ENCOUNTER — Ambulatory Visit: Payer: Medicare Other | Attending: Physician Assistant | Admitting: Physician Assistant

## 2022-08-07 VITALS — BP 120/60 | HR 57 | Ht 60.0 in | Wt 168.0 lb

## 2022-08-07 DIAGNOSIS — R7989 Other specified abnormal findings of blood chemistry: Secondary | ICD-10-CM | POA: Diagnosis not present

## 2022-08-07 DIAGNOSIS — I10 Essential (primary) hypertension: Secondary | ICD-10-CM | POA: Diagnosis not present

## 2022-08-07 DIAGNOSIS — I480 Paroxysmal atrial fibrillation: Secondary | ICD-10-CM | POA: Diagnosis not present

## 2022-08-07 DIAGNOSIS — I5032 Chronic diastolic (congestive) heart failure: Secondary | ICD-10-CM | POA: Diagnosis not present

## 2022-08-07 DIAGNOSIS — Z79899 Other long term (current) drug therapy: Secondary | ICD-10-CM

## 2022-08-07 LAB — CBC: RBC: 4.24 (ref 3.87–5.11)

## 2022-08-07 LAB — BASIC METABOLIC PANEL
BUN: 31 — AB (ref 4–21)
CO2: 30 — AB (ref 13–22)
Chloride: 105 (ref 99–108)
Creatinine: 1 (ref 0.5–1.1)
Glucose: 136
Potassium: 4.6 mEq/L (ref 3.5–5.1)
Sodium: 140 (ref 137–147)

## 2022-08-07 LAB — HEMOGLOBIN A1C: Hemoglobin A1C: 7.9

## 2022-08-07 LAB — CBC AND DIFFERENTIAL
HCT: 38 (ref 36–46)
Hemoglobin: 12.8 (ref 12.0–16.0)
Neutrophils Absolute: 2191
Platelets: 267 10*3/uL (ref 150–400)
WBC: 6.6

## 2022-08-07 LAB — COMPREHENSIVE METABOLIC PANEL
Albumin: 3.9 (ref 3.5–5.0)
Calcium: 9.4 (ref 8.7–10.7)
Globulin: 2.5
eGFR: 56

## 2022-08-07 LAB — HEPATIC FUNCTION PANEL
ALT: 15 U/L (ref 7–35)
AST: 13 (ref 13–35)
Alkaline Phosphatase: 44 (ref 25–125)
Bilirubin, Total: 0.5

## 2022-08-07 LAB — LIPID PANEL
Cholesterol: 240 — AB (ref 0–200)
HDL: 39 (ref 35–70)
LDL Cholesterol: 165
Triglycerides: 199 — AB (ref 40–160)

## 2022-08-07 MED ORDER — FUROSEMIDE 20 MG PO TABS: 20.0000 mg | ORAL_TABLET | ORAL | 3 refills | Status: DC

## 2022-08-07 NOTE — Patient Instructions (Signed)
Medication Instructions:  Your physician recommends the following medication changes.  DECREASE: Lasix to 20 mg by mouth every other day  *If you need a refill on your cardiac medications before your next appointment, please call your pharmacy*   Lab Work: Your physician recommends lab work in 1 week (BMP)   Testing/Procedures: None ordered today   Follow-Up: At [facility], you and your health needs are our priority.  As part of our continuing mission to provide you with exceptional heart care, we have created designated Provider Care Teams.  These Care Teams include your primary Cardiologist (physician) and Advanced Practice Providers (APPs -  Physician Assistants and Nurse Practitioners) who all work together to provide you with the care you need, when you need it.  We recommend signing up for the patient portal called "MyChart".  Sign up information is provided on this After Visit Summary.  MyChart is used to connect with patients for Virtual Visits (Telemedicine).  Patients are able to view lab/test results, encounter notes, upcoming appointments, etc.  Non-urgent messages can be sent to your provider as well.   To learn more about what you can do with MyChart, go to [email or web address].    Your next appointment:   2 month(s)  Provider:   You may see Yvonne Kendall, MD or one of the following Advanced Practice Providers on your designated Care Team:   Nicolasa Ducking, NP Eula Listen, PA-C Cadence Fransico Michael, PA-C Charlsie Quest, NP

## 2022-08-14 ENCOUNTER — Encounter: Payer: Self-pay | Admitting: Internal Medicine

## 2022-08-14 LAB — BASIC METABOLIC PANEL
BUN: 28 — AB (ref 4–21)
CO2: 28 — AB (ref 13–22)
Chloride: 105 (ref 99–108)
Creatinine: 0.9 (ref 0.5–1.1)
Glucose: 161
Potassium: 4.5 mEq/L (ref 3.5–5.1)
Sodium: 139 (ref 137–147)

## 2022-08-14 LAB — COMPREHENSIVE METABOLIC PANEL
Calcium: 9.4 (ref 8.7–10.7)
eGFR: 58

## 2022-08-15 ENCOUNTER — Telehealth: Payer: Self-pay | Admitting: Internal Medicine

## 2022-08-15 NOTE — Telephone Encounter (Signed)
Pt last seen by Eula Listen, PA on 08/08/22 Forms given to PA's nurse for Ryan to review.

## 2022-08-15 NOTE — Telephone Encounter (Signed)
Received form via fax from [facility] needing signature from Dr. Okey Dupre for patient to participate in the exercise and wellness program . Placed in Dr. Serita Kyle nurse box.

## 2022-08-18 ENCOUNTER — Telehealth: Payer: Self-pay | Admitting: *Deleted

## 2022-08-18 ENCOUNTER — Other Ambulatory Visit: Payer: Self-pay | Admitting: *Deleted

## 2022-08-18 DIAGNOSIS — E1169 Type 2 diabetes mellitus with other specified complication: Secondary | ICD-10-CM

## 2022-08-18 DIAGNOSIS — I480 Paroxysmal atrial fibrillation: Secondary | ICD-10-CM

## 2022-08-18 DIAGNOSIS — Z79899 Other long term (current) drug therapy: Secondary | ICD-10-CM

## 2022-08-18 MED ORDER — ATORVASTATIN CALCIUM 40 MG PO TABS: 40.0000 mg | ORAL_TABLET | Freq: Every day | ORAL | 3 refills | Status: DC

## 2022-08-18 NOTE — Telephone Encounter (Signed)
-----   Message from Sondra Barges, PA-C sent at 08/18/2022  7:32 AM EDT ----- Please inform the patient her renal function is stable.  Liver function has normalized.  Would continue current dose of furosemide.  If she is agreeable, start atorvastatin 40 mg daily with a follow-up fasting LFT and lipid panel in 3 months.

## 2022-08-18 NOTE — Telephone Encounter (Signed)
Spoke with Theron Arista from [employer]. Reviewed changes and instructions. He requested that we fax all orders to their office at [phone]. Orders and other forms sent back via fax.

## 2022-08-18 NOTE — Telephone Encounter (Signed)
Spoke with patients daughter per release form. Reviewed results and recommendations. She verbalized understanding and reports that the patient has her medications dispensed by [facility]. She further states that they also do labs for her there. Advised that I would be happy to send this information over. She provided this name and number to call for assistance: Grandville Silos at [phone]. She verbalized understanding of our conversation with no further questions at this time.   Called number for Theron Arista and left voicemail message to call back.

## 2022-08-29 ENCOUNTER — Non-Acute Institutional Stay: Payer: Medicare Other | Admitting: Student

## 2022-08-29 ENCOUNTER — Encounter: Payer: Self-pay | Admitting: Student

## 2022-08-29 DIAGNOSIS — R197 Diarrhea, unspecified: Secondary | ICD-10-CM | POA: Diagnosis not present

## 2022-08-29 NOTE — Progress Notes (Signed)
Location:  [facility] Room Number: [name] 119J Place of Service:  [id number]) Provider:  [name], MD  Patient Care Team: [name], MD as PCP - General (Family Medicine) End, Cristal Deer, MD as PCP - Cardiology (Cardiology) Lockie Mola, MD as Referring Physician (Ophthalmology) Sharon Seller, NP as Nurse Practitioner (Geriatric Medicine) Pa, [facility])  Extended Emergency Contact Information Primary Emergency Contact: [name] Address: [address]          [place], [place] [address] [name] of [place] Home Phone: [phone] Work Phone: [phone] Relation: Daughter  Code Status:  DNR Goals of care: Advanced Directive information    08/29/2022   11:40 AM  Advanced Directives  Does Patient Have a Medical Advance Directive? Yes  Type of Advance Directive Out of facility DNR (pink MOST or yellow form)  Does patient want to make changes to medical advance directive? No - Patient declined     Chief Complaint  Patient presents with  . Acute Visit    Diarrhea.     HPI:  [name] is a 89 y.o. female seen today for an acute visit for Diarrhea  sHE HAS IT FIRST THING IN THE MORNING It's not all day. She gets up goes to the bathroom urinates and then she has a stool. Denies nasue or vomiting. Denies fevers.   Started 2-3 weeks ago.  Denies red, black or white stools.   She doesn't have a lot of milk products. "Ill tell you now, I'm not going on Insulin. If I have to go on insulin to be alive I don't want it."  She doesn't eat cookies and bread. She may  eat 1-2 pieces of bread per day.   She does'nt drink a lot of water.  Past Medical History:  Diagnosis Date  . Acute pancreatitis 07/24/2021  . Basal cell carcinoma 03/30/2008   left upper arm/excision  . Basal cell carcinoma 03/31/2008   left upper arm, right lower leg  . Basal cell carcinoma 01/18/2009   left upper arm  . Collagenous  colitis   . Diabetes mellitus without complication (HCC)   . Diverticulitis   . Elevated LFTs 07/25/2021  . Heart murmur    at birth, none since  . History of echocardiogram    a. 11/2015: echo showing EF of 60-65% with no WMA. Grade 1 DD and mild MR noted.   . Hyperlipidemia    Per [facility]  . Hypertension   . Intertrochanteric fracture of right femur (HCC) 06/11/2019  . Lack of coordination    Per [facility]  . Long term current use of anticoagulant    Per [facility]  . Noninfective gastroenteritis and colitis, unspecified    Per [facility]  . PAF (paroxysmal atrial fibrillation) (HCC)    a. initially diagnosed in 08/2016 --> started on Eliquis  . Squamous cell carcinoma of skin 05/16/2020   Left nasal tip anterior to ala, refer to [facility]  . Syncope    a. initially occurring in Fall 2016 b. Hospitalized in 10/2015 and 11/2015 for recurrent episodes.    Past Surgical History:  Procedure Laterality Date  . ABDOMINAL HYSTERECTOMY    . BACK SURGERY  08/08/2008   Lumbar discectomy  . COLONOSCOPY WITH PROPOFOL    . EXCISION/RELEASE BURSA HIP Left 05/03/2019   Procedure: HIP ABDUCTOR REPAIR;  Surgeon: Kennedy Bucker, MD;  Location: [facility] ORS;  Service: Orthopedics;  Laterality: Left;  . INTRAMEDULLARY (IM) NAIL INTERTROCHANTERIC  Right 06/13/2019   Procedure: INTRAMEDULLARY (IM) NAIL INTERTROCHANTRIC;  Surgeon: Kennedy Bucker, MD;  Location: [facility] ORS;  Service: Orthopedics;  Laterality: Right;  . NECK SURGERY  1980    Allergies  Allergen Reactions  . Morphine Sulfate Other (See Comments)    BP bottoms out  . Penicillins Diarrhea    Has patient had a PCN reaction causing immediate rash, facial/tongue/throat swelling, SOB or lightheadedness with hypotension: Yes Has patient had a PCN reaction causing severe rash involving mucus membranes or skin necrosis: Yes Has patient had a PCN reaction that required hospitalization No Has patient had a PCN reaction occurring within the last 10  years: No If all of the above answers are "NO", then may proceed with Cephalosporin use.    Outpatient Encounter Medications as of 08/29/2022  Medication Sig  . acetaminophen (TYLENOL) 325 MG tablet Take 650 mg by mouth every 4 (four) hours as needed for mild pain or moderate pain.  Marland Kitchen albuterol (VENTOLIN HFA) 108 (90 Base) MCG/ACT inhaler Inhale 2 puffs into the lungs every 6 (six) hours as needed for wheezing or shortness of breath.  Marland Kitchen alum & mag hydroxide-simeth (MAALOX/MYLANTA) 200-200-20 MG/5 SUSP Give 30ml by mouth every 4 hours as needed.  Marland Kitchen apixaban (ELIQUIS) 5 MG TABS tablet Take 1 tablet (5 mg total) by mouth 2 (two) times daily.  Marland Kitchen atorvastatin (LIPITOR) 40 MG tablet Take 1 tablet (40 mg total) by mouth daily.  . benzonatate (TESSALON PERLES) 100 MG capsule Take 1 capsule (100 mg total) by mouth 3 (three) times daily as needed for cough.  . bisacodyl (DULCOLAX) 10 MG suppository Place 10 mg rectally as needed for moderate constipation.  . bismuth subsalicylate (PEPTO BISMOL) 262 MG/15ML suspension Give 10 cc by mouth as needed for loose stool supervised self-administration  . carbamide peroxide (DEBROX) 6.5 % OTIC solution Place 5 drops into both ears as needed.  . cetirizine (ZYRTEC) 10 MG tablet Take 10 mg by mouth daily as needed for allergies.  . Cholecalciferol (VITAMIN D) 50 MCG (2000 UT) CAPS Give one capsule by mouth once daily .  Marland Kitchen Clobetasol Propionate 0.05 % shampoo Apply 1 Application topically every morning. And wash out  . dextromethorphan-guaiFENesin (ROBITUSSIN-DM) 10-100 MG/5ML liquid Give 2 tsp by mouth every 4 hours as needed for coughing. supervised self-administration Notify MD if no relief in 3 days or continued use  . dextrose (GLUTOSE) 40 % GEL Take 1 Tube by mouth as needed for low blood sugar.  . fluocinolone (SYNALAR) 0.01 % external solution Apply 1 Application topically at bedtime. Apply to scalp  . furosemide (LASIX) 20 MG tablet Take 1 tablet (20 mg  total) by mouth every other day.  Marland Kitchen glipiZIDE (GLUCOTROL XL) 10 MG 24 hr tablet Take 10 mg by mouth 2 (two) times daily.  . Infant Care Products [facility] EX) Apply to buttocks/periarea topically every shift  . ipratropium-albuterol (DUONEB) 0.5-2.5 (3) MG/3ML SOLN Take 3 mLs by nebulization every 4 (four) hours as needed.  . linagliptin (TRADJENTA) 5 MG TABS tablet Take 1 tablet (5 mg total) by mouth daily.  Marland Kitchen lisinopril (ZESTRIL) 20 MG tablet TAKE 1 TABLET BY MOUTH DAILY  . magnesium hydroxide (MILK OF MAGNESIA) 400 MG/5ML suspension Give 2 Tbsp by mouth as needed for constipation. supervised self-administration daily -- Call MD if no relief in 3 days of continued use  . metFORMIN (GLUCOPHAGE-XR) 500 MG 24 hr tablet Take 1 tablet (500 mg total) by mouth 2 (two) times daily.  Marland Kitchen  metoprolol succinate (TOPROL-XL) 25 MG 24 hr tablet TAKE 1 TABLET BY MOUTH ONCE DAILY  . Multiple Vitamin (MULTIVITAMIN WITH MINERALS) TABS tablet Take 1 tablet by mouth daily.  Marland Kitchen nystatin (MYCOSTATIN/NYSTOP) powder Apply to excoriated area topically as needed Twice daily.  . ondansetron (ZOFRAN) 4 MG tablet Take 4 mg by mouth every 8 (eight) hours as needed for nausea or vomiting.  . OXYGEN 2lpm as needed for dyspnea/SOB  . polyethylene glycol (MIRALAX / GLYCOLAX) 17 g packet Take 17 g by mouth daily as needed.   No facility-administered encounter medications on file as of 08/29/2022.    Review of Systems  Immunization History  Administered Date(s) Administered  . Covid-19, Mrna,Vaccine(Spikevax)73yrs and older 06/17/2022  . Fluad Quad(high Dose 65+) 12/20/2018  . Influenza Split 01/01/2009, 01/02/2010, 01/13/2011, 12/22/2011  . Influenza, High Dose Seasonal PF 01/18/2014, 01/01/2015, 11/30/2015, 12/01/2016, 01/11/2018, 12/08/2019  . Influenza-Unspecified 01/08/2013, 11/08/2020, 12/25/2021  . Moderna Covid-19 Vaccine Bivalent Booster 46yrs & up 11/30/2020, 08/06/2021, 01/17/2022  . PFIZER Comirnaty(Gray  Top)Covid-19 Tri-Sucrose Vaccine 07/27/2020  . PFIZER(Purple Top)SARS-COV-2 Vaccination 04/01/2019, 04/22/2019, 12/05/2019  . [employer], [employer] 60yrs & up 11/30/2020  . Pneumococcal Conjugate-13 10/19/2013  . Pneumococcal Polysaccharide-23 01/02/2010  . Td 11/02/2003, 01/13/2011  . Tdap 01/13/2011, 02/28/2015  . Zoster, Live 07/14/2007   Pertinent  Health Maintenance Due  Topic Date Due  . OPHTHALMOLOGY EXAM  06/08/2021  . INFLUENZA VACCINE  10/09/2022  . HEMOGLOBIN A1C  02/07/2023  . FOOT EXAM  08/06/2023  . DEXA SCAN  Completed      10/19/2020   11:04 AM 07/24/2021    4:09 PM 07/24/2021   10:50 PM 07/25/2021   10:02 AM 07/26/2021    1:00 AM  Fall Risk  Falls in the past year? 0      Was there an injury with Fall? 0      Fall Risk Category Calculator 0      Fall Risk Category (Retired) Low      (RETIRED) Patient Fall Risk Level Low fall risk Moderate fall risk High fall risk High fall risk High fall risk  Patient at Risk for Falls Due to Impaired balance/gait      Fall risk Follow up Falls evaluation completed       Functional Status Survey:    Vitals:   08/29/22 1129  BP: 127/79  Pulse: 65  Resp: 20  Temp: (!) 97.4 F (36.3 C)  SpO2: 98%  Weight: 196 lb (88.9 kg)  Height: 5' (1.524 m)   Body mass index is 38.28 kg/m. Physical Exam  Labs reviewed: Recent Labs    05/10/22 0104 05/11/22 0313 05/12/22 0533 05/13/22 0704 05/14/22 0351 07/24/22 0000 08/07/22 0000 08/14/22 0000  NA 136   < > 137 134* 137 140 140 139  K 3.4*   < > 3.6 3.7 3.7 4.5 4.6 4.5  CL 103   < > 104 105 104 106 105 105  CO2 24   < > 23 23 24  27* 30* 28*  GLUCOSE 246*   < > 205* 203* 231*  --   --   --   BUN 24*   < > 13 14 19  23* 31* 28*  CREATININE 0.79   < > 0.66 0.72 0.78 0.9 1.0 0.9  CALCIUM 8.7*   < > 8.5* 8.5* 8.6* 9.2 9.4 9.4  MG 1.6*  --   --   --   --   --   --   --    < > =  values in this interval not displayed.   Recent Labs    05/12/22 0533  05/13/22 0704 05/14/22 0351 08/07/22 0000  AST 162* 67* 40 13  ALT 308* 219* 169* 15  ALKPHOS 122 142* 136* 44  BILITOT 1.2 0.9 0.6  --   PROT 5.4* 5.5* 5.7*  --   ALBUMIN 2.5* 2.6* 2.6* 3.9   Recent Labs    05/12/22 0533 05/13/22 0704 05/14/22 0351 08/07/22 0000  WBC 6.1 8.3 8.3 6.6  NEUTROABS 3.0 3.7 3.1 2,191.00  HGB 11.4* 11.8* 11.5* 12.8  HCT 34.9* 35.9* 34.8* 38  MCV 89.0 89.8 88.3  --   PLT 205 223 241 267   Lab Results  Component Value Date   TSH 2.284 04/17/2022   Lab Results  Component Value Date   HGBA1C 7.9 08/07/2022   Lab Results  Component Value Date   CHOL 240 (A) 08/07/2022   HDL 39 08/07/2022   LDLCALC 165 08/07/2022   TRIG 199 (A) 08/07/2022   CHOLHDL 5.2 05/10/2022    Significant Diagnostic Results in last 30 days:  No results found.  Assessment/Plan There are no diagnoses linked to this encounter.   Family/ staff Communication: ***  Labs/tests ordered:  ***

## 2022-09-18 ENCOUNTER — Encounter: Payer: Self-pay | Admitting: Nurse Practitioner

## 2022-09-18 ENCOUNTER — Non-Acute Institutional Stay (INDEPENDENT_AMBULATORY_CARE_PROVIDER_SITE_OTHER): Payer: Medicare Other | Admitting: Nurse Practitioner

## 2022-09-18 DIAGNOSIS — Z0000 Encounter for general adult medical examination without abnormal findings: Secondary | ICD-10-CM

## 2022-09-18 NOTE — Patient Instructions (Signed)
  [name] , Thank you for taking time to come for your Medicare Wellness Visit. I appreciate your ongoing commitment to your health goals. Please review the following plan we discussed and let me know if I can assist you in the future.   These are the goals we discussed:  Goals      DIET - INCREASE WATER INTAKE     Recommend to drink at least 6-8 8oz glasses of water per day.        This is a list of the screening recommended for you and due dates:  Health Maintenance  Topic Date Due   Zoster (Shingles) Vaccine (1 of 2) 06/20/1950   Eye exam for diabetics  06/08/2021   COVID-19 Vaccine (9 - 2023-24 season) 08/12/2022   Flu Shot  10/09/2022   Hemoglobin A1C  02/07/2023   Complete foot exam   08/06/2023   Medicare Annual Wellness Visit  09/18/2023   DTaP/Tdap/Td vaccine (5 - Td or Tdap) 02/27/2025   Pneumonia Vaccine  Completed   DEXA scan (bone density measurement)  Completed   HPV Vaccine  Aged Out     Needing shingles vaccine and bone density.

## 2022-09-18 NOTE — Progress Notes (Signed)
Subjective:   [name] is a 89 y.o. female who presents for Medicare Annual (Subsequent) preventive examination.  Visit Complete: In person at [facility]   Patient Medicare AWV questionnaire was completed by the patient on 09/18/2022; I have confirmed that all information answered by patient is correct and no changes since this date.  Review of Systems     Cardiac Risk Factors include: advanced age (>52men, >66 women);hypertension;diabetes mellitus;dyslipidemia;sedentary lifestyle;obesity (BMI >30kg/m2)     Objective:    Today's Vitals   09/18/22 1042  BP: 136/68  Pulse: 61  Resp: 16  Temp: 97.6 F (36.4 C)  SpO2: 97%  Weight: 168 lb (76.2 kg)  Height: 5' (1.524 m)   Body mass index is 32.81 kg/m.     09/18/2022   10:52 AM 08/29/2022   11:40 AM 08/06/2022    8:55 AM 08/05/2022    4:25 PM 06/18/2022   12:30 PM 05/21/2022    9:20 AM 05/09/2022    8:58 AM  Advanced Directives  Does Patient Have a Medical Advance Directive? Yes Yes Yes Yes Yes Yes No  Type of Advance Directive Out of facility DNR (pink MOST or yellow form) Out of facility DNR (pink MOST or yellow form) Out of facility DNR (pink MOST or yellow form) Out of facility DNR (pink MOST or yellow form) Out of facility DNR (pink MOST or yellow form) Out of facility DNR (pink MOST or yellow form)   Does patient want to make changes to medical advance directive? No - Patient declined No - Patient declined No - Patient declined No - Patient declined No - Patient declined No - Patient declined No - Patient declined  Would patient like information on creating a medical advance directive?       No - Patient declined  Pre-existing out of facility DNR order (yellow form or pink MOST form)   Yellow form placed in chart (order not valid for inpatient use) Yellow form placed in chart (order not valid for inpatient use)       Current Medications (verified) Outpatient Encounter Medications as of 09/18/2022  Medication Sig    acetaminophen (TYLENOL) 325 MG tablet Take 650 mg by mouth every 4 (four) hours as needed for mild pain or moderate pain.   albuterol (VENTOLIN HFA) 108 (90 Base) MCG/ACT inhaler Inhale 2 puffs into the lungs every 6 (six) hours as needed for wheezing or shortness of breath.   alum & mag hydroxide-simeth (MAALOX/MYLANTA) 200-200-20 MG/5 SUSP Give 30ml by mouth every 4 hours as needed.   apixaban (ELIQUIS) 5 MG TABS tablet Take 1 tablet (5 mg total) by mouth 2 (two) times daily.   atorvastatin (LIPITOR) 40 MG tablet Take 1 tablet (40 mg total) by mouth daily.   benzonatate (TESSALON PERLES) 100 MG capsule Take 1 capsule (100 mg total) by mouth 3 (three) times daily as needed for cough.   bisacodyl (DULCOLAX) 10 MG suppository Place 10 mg rectally as needed for moderate constipation.   bismuth subsalicylate (PEPTO BISMOL) 262 MG/15ML suspension Give 10 cc by mouth as needed for loose stool supervised self-administration   carbamide peroxide (DEBROX) 6.5 % OTIC solution Place 5 drops into both ears as needed.   cetirizine (ZYRTEC) 10 MG tablet Take 10 mg by mouth daily as needed for allergies.   Cholecalciferol (VITAMIN D) 50 MCG (2000 UT) CAPS Give one capsule by mouth once daily .   Clobetasol Propionate 0.05 % shampoo Apply 1 Application topically every morning.  And wash out   dextromethorphan-guaiFENesin (ROBITUSSIN-DM) 10-100 MG/5ML liquid Give 2 tsp by mouth every 4 hours as needed for coughing. supervised self-administration Notify MD if no relief in 3 days or continued use   dextrose (GLUTOSE) 40 % GEL Take 1 Tube by mouth as needed for low blood sugar.   fluocinolone (SYNALAR) 0.01 % external solution Apply 1 Application topically at bedtime. Apply to scalp   furosemide (LASIX) 20 MG tablet Take 20 mg by mouth every other day. Give one tablet by mouth as needed.   glipiZIDE (GLUCOTROL XL) 10 MG 24 hr tablet Take 10 mg by mouth 2 (two) times daily.   Infant Care Products [facility] EX) Apply  to buttocks/periarea topically every shift   ipratropium-albuterol (DUONEB) 0.5-2.5 (3) MG/3ML SOLN Take 3 mLs by nebulization every 4 (four) hours as needed.   linagliptin (TRADJENTA) 5 MG TABS tablet Take 1 tablet (5 mg total) by mouth daily.   lisinopril (ZESTRIL) 20 MG tablet TAKE 1 TABLET BY MOUTH DAILY   magnesium hydroxide (MILK OF MAGNESIA) 400 MG/5ML suspension Give 2 Tbsp by mouth as needed for constipation. supervised self-administration daily -- Call MD if no relief in 3 days of continued use   metFORMIN (GLUCOPHAGE-XR) 500 MG 24 hr tablet Take 1 tablet (500 mg total) by mouth 2 (two) times daily.   metoprolol succinate (TOPROL-XL) 25 MG 24 hr tablet TAKE 1 TABLET BY MOUTH ONCE DAILY   Multiple Vitamin (MULTIVITAMIN WITH MINERALS) TABS tablet Take 1 tablet by mouth daily.   nystatin (MYCOSTATIN/NYSTOP) powder Apply to excoriated area topically as needed Twice daily.   ondansetron (ZOFRAN) 4 MG tablet Take 4 mg by mouth every 8 (eight) hours as needed for nausea or vomiting.   OXYGEN 2lpm as needed for dyspnea/SOB   polyethylene glycol (MIRALAX / GLYCOLAX) 17 g packet Take 17 g by mouth daily as needed.   [DISCONTINUED] furosemide (LASIX) 20 MG tablet Take 1 tablet (20 mg total) by mouth every other day. (Patient taking differently: Take 20 mg by mouth every other day. Give one tablet by mourn as needed)   No facility-administered encounter medications on file as of 09/18/2022.    Allergies (verified) Morphine sulfate and Penicillins   History: Past Medical History:  Diagnosis Date   Acute pancreatitis 07/24/2021   Basal cell carcinoma 03/30/2008   left upper arm/excision   Basal cell carcinoma 03/31/2008   left upper arm, right lower leg   Basal cell carcinoma 01/18/2009   left upper arm   Collagenous colitis    Diabetes mellitus without complication (HCC)    Diverticulitis    Elevated LFTs 07/25/2021   Heart murmur    at birth, none since   History of echocardiogram     a. 11/2015: echo showing EF of 60-65% with no WMA. Grade 1 DD and mild MR noted.    Hyperlipidemia    Per [facility]   Hypertension    Intertrochanteric fracture of right femur (HCC) 06/11/2019   Lack of coordination    Per [facility] term current use of anticoagulant    Per [facility]   Noninfective gastroenteritis and colitis, unspecified    Per [facility]   PAF (paroxysmal atrial fibrillation) (HCC)    a. initially diagnosed in 08/2016 --> started on Eliquis   Squamous cell carcinoma of skin 05/16/2020   Left nasal tip anterior to ala, refer to [facility]   Syncope    a. initially occurring in Fall 2016  b. Hospitalized in 10/2015 and 11/2015 for recurrent episodes.    Past Surgical History:  Procedure Laterality Date   ABDOMINAL HYSTERECTOMY     BACK SURGERY  08/08/2008   Lumbar discectomy   COLONOSCOPY WITH PROPOFOL     EXCISION/RELEASE BURSA HIP Left 05/03/2019   Procedure: HIP ABDUCTOR REPAIR;  Surgeon: Kennedy Bucker, MD;  Location: [facility] ORS;  Service: Orthopedics;  Laterality: Left;   INTRAMEDULLARY (IM) NAIL INTERTROCHANTERIC Right 06/13/2019   Procedure: INTRAMEDULLARY (IM) NAIL INTERTROCHANTRIC;  Surgeon: Kennedy Bucker, MD;  Location: [facility] ORS;  Service: Orthopedics;  Laterality: Right;   NECK SURGERY  1980   Family History  Problem Relation Age of Onset   Hypertension Mother    Diabetes Mother    Lung cancer Father    Social History   Socioeconomic History   Marital status: Widowed    Spouse name: Not on file   Number of children: 3   Years of education: Not on file   Highest education level: Associate degree: academic program  Occupational History   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  Tobacco Use   Smoking status: Former    Current packs/day: 0.00    Types: Cigarettes    Quit date: 03/09/1978    Years since quitting: 44.5   Smokeless tobacco: Never  Vaping Use   Vaping status: Never Used  Substance and Sexual Activity   Alcohol use: Not Currently     Comment: once a month--mixed drink   Drug use: No   Sexual activity: Not on file  Other Topics Concern   Not on file  Social History Narrative   2 daughters and a son (1 local plus [place] and [place])      Has living will    Daughter Eunice Blase is [facility] POA   Has DNR   No feeding tube   Social Determinants of Health   Financial Resource Strain: Low Risk  (04/25/2020)   Overall Financial Resource Strain (CARDIA)    Difficulty of Paying Living Expenses: Not hard at all  Food Insecurity: No Food Insecurity (04/25/2020)   Hunger Vital Sign    Worried About Running Out of Food in the Last Year: Never true    Ran Out of Food in the Last Year: Never true  Transportation Needs: No Transportation Needs (05/11/2022)   PRAPARE - Administrator, Civil Service (Medical): No    Lack of Transportation (Non-Medical): No  Physical Activity: Inactive (04/25/2020)   Exercise Vital Sign    Days of Exercise per Week: 0 days    Minutes of Exercise per Session: 0 min  Stress: No Stress Concern Present (04/25/2020)   [employer] of Occupational Health - Occupational Stress Questionnaire    Feeling of Stress : Not at all  Social Connections: Moderately Isolated (04/25/2020)   Social Connection and Isolation Panel [NHANES]    Frequency of Communication with Friends and Family: More than three times a week    Frequency of Social Gatherings with Friends and Family: More than three times a week    Attends Religious Services: More than 4 times per year    Active Member of [employer] or Organizations: No    Attends Banker Meetings: Never    Marital Status: Widowed    Tobacco Counseling Counseling given: Not Answered   Clinical Intake:  Pre-visit preparation completed: Yes  Pain : No/denies pain     BMI - recorded: 38 Nutritional Risks: None Diabetes: No  How often do you  need to have someone help you when you read instructions, pamphlets, or other written materials  from your doctor or pharmacy?: 1 - Never         Activities of Daily Living    09/18/2022    9:28 AM 05/13/2022    4:00 PM  In your present state of health, do you have any difficulty performing the following activities:  Hearing? 0 1  Vision? 0 0  Difficulty concentrating or making decisions? 1 0  Comment some  trouble remembering   Walking or climbing stairs? 1 1  Comment does not climb stairs   Dressing or bathing? 0 1  Doing errands, shopping? 1 1  Comment does not drive   Preparing Food and eating ? N   Using the Toilet? N   In the past six months, have you accidently leaked urine? Y   Do you have problems with loss of bowel control? N   Managing your Medications? N   Comment has help   Managing your Finances? N   Housekeeping or managing your Housekeeping? N     Patient Care Team: [name], MD as PCP - General (Family Medicine) End, Cristal Deer, MD as PCP - Cardiology (Cardiology) Lockie Mola, MD as Referring Physician (Ophthalmology) Sharon Seller, NP as Nurse Practitioner (Geriatric Medicine) Pa, [facility] (Optometry)  Indicate any recent Medical Services you may have received from other than [facility] providers in the past year (date may be approximate).     Assessment:   This is a routine wellness examination for [place].  Hearing/Vision screen No results found.  Dietary issues and exercise activities discussed:     Goals Addressed   None    Depression Screen    09/18/2022    9:32 AM 10/19/2020   11:03 AM 04/25/2020    2:37 PM 02/09/2020   10:39 AM 12/05/2019   12:12 PM 02/09/2018    1:40 PM 01/19/2018    2:40 PM  PHQ 2/9 Scores  PHQ - 2 Score 0 0 0 0 0 1 0  PHQ- 9 Score  2   3      Fall Risk    09/18/2022    9:32 AM 10/19/2020   11:04 AM 04/25/2020    2:39 PM 02/09/2020   10:39 AM 12/05/2019   12:12 PM  Fall Risk   Falls in the past year? 0 0 0 0 1  Number falls in past yr:  0 0 0 0  Injury with Fall?  0 0 0 1   Risk for fall due to :  Impaired balance/gait     Follow up  Falls evaluation completed  Falls evaluation completed     MEDICARE RISK AT HOME:   TIMED UP AND GO:  Was the test performed?  No    Cognitive Function:        04/25/2020    3:42 PM 02/09/2018    1:48 PM  6CIT Screen  What Year? 0 points 0 points  What month? 0 points 0 points  What time? 0 points 0 points  Count back from 20 0 points 0 points  Months in reverse 0 points 0 points  Repeat phrase 4 points 4 points  Total Score 4 points 4 points    Immunizations Immunization History  Administered Date(s) Administered   Covid-19, Mrna,Vaccine(Spikevax)53yrs and older 06/17/2022   Fluad Quad(high Dose 65+) 12/20/2018   Influenza Split 01/01/2009, 01/02/2010, 01/13/2011, 12/22/2011   Influenza, High Dose Seasonal PF 01/18/2014, 01/01/2015,  11/30/2015, 12/01/2016, 01/11/2018, 12/08/2019   Influenza-Unspecified 01/08/2013, 11/08/2020, 12/25/2021   Moderna Covid-19 Vaccine Bivalent Booster 34yrs & up 11/30/2020, 08/06/2021, 01/17/2022   PFIZER Comirnaty(Gray Top)Covid-19 Tri-Sucrose Vaccine 07/27/2020   PFIZER(Purple Top)SARS-COV-2 Vaccination 04/01/2019, 04/22/2019, 12/05/2019   Pfizer Covid-19 Vaccine Bivalent Booster 92yrs & up 11/30/2020   Pneumococcal Conjugate-13 10/19/2013   Pneumococcal Polysaccharide-23 01/02/2010   Td 11/02/2003, 01/13/2011   Tdap 01/13/2011, 02/28/2015   Zoster, Live 07/14/2007    TDAP status: Up to date  Flu Vaccine status: Up to date  Pneumococcal vaccine status: Up to date  Covid-19 vaccine status: Information provided on how to obtain vaccines.   Qualifies for Shingles Vaccine? Yes   Zostavax completed No   Shingrix Completed?: No.    Education has been provided regarding the importance of this vaccine. Patient has been advised to call insurance company to determine out of pocket expense if they have not yet received this vaccine. Advised may also receive vaccine at local  pharmacy or Health Dept. Verbalized acceptance and understanding.  Screening Tests Health Maintenance  Topic Date Due   Zoster Vaccines- Shingrix (1 of 2) 06/20/1950   OPHTHALMOLOGY EXAM  06/08/2021   COVID-19 Vaccine (9 - 2023-24 season) 08/12/2022   INFLUENZA VACCINE  10/09/2022   HEMOGLOBIN A1C  02/07/2023   FOOT EXAM  08/06/2023   Medicare Annual Wellness (AWV)  09/18/2023   DTaP/Tdap/Td (5 - Td or Tdap) 02/27/2025   Pneumonia Vaccine 9+ Years old  Completed   DEXA SCAN  Completed   HPV VACCINES  Aged Out    Health Maintenance  Health Maintenance Due  Topic Date Due   Zoster Vaccines- Shingrix (1 of 2) 06/20/1950   OPHTHALMOLOGY EXAM  06/08/2021   COVID-19 Vaccine (9 - 2023-24 season) 08/12/2022    Colorectal cancer screening: No longer required.   Mammogram status: No longer required due to aged out .  Declined bone density   Lung Cancer Screening: (Low Dose CT Chest recommended if Age 36-80 years, 20 pack-year currently smoking OR have quit w/in 15years.) does not qualify.   Lung Cancer Screening Referral: na  Additional Screening:  Hepatitis C Screening: does not qualify  Vision Screening: Recommended annual ophthalmology exams for early detection of glaucoma and other disorders of the eye. Is the patient up to date with their annual eye exam?  Yes  Who is the provider or what is the name of the office in which the patient attends annual eye exams? [facility]  If pt is not established with a provider, would they like to be referred to a provider to establish care? No .   Dental Screening: Recommended annual dental exams for proper oral hygiene  Diabetic Foot Exam: Diabetic Foot Exam: Completed 07/27/2022  Community Resource Referral / Chronic Care Management: CRR required this visit?  No   CCM required this visit?  No     Plan:     I have personally reviewed and noted the following in the patient's chart:   Medical and social history Use of  alcohol, tobacco or illicit drugs  Current medications and supplements including opioid prescriptions. Patient is not currently taking opioid prescriptions. Functional ability and status Nutritional status Physical activity Advanced directives List of other physicians Hospitalizations, surgeries, and ER visits in previous 12 months Vitals Screenings to include cognitive, depression, and falls Referrals and appointments  In addition, I have reviewed and discussed with patient certain preventive protocols, quality metrics, and best practice recommendations. A written personalized care plan for  preventive services as well as general preventive health recommendations were provided to patient.     Sharon Seller, NP   09/18/2022

## 2022-10-20 NOTE — Progress Notes (Unsigned)
Cardiology Office Note    Date:  10/21/2022   ID:  LAJOYA MCMEANS, DOB 1931-04-21, MRN [id number]  PCP:  [name], MD  Cardiologist:  Yvonne Kendall, MD  Electrophysiologist:  None   Chief Complaint: Follow up  History of Present Illness:   [name] is a 89 y.o. female with history of HFpEF, PAF diagnosed in 2018, elevated troponin felt to be type II MI (supply/demand mismatch ischemia) in 3/24, recurrent syncope felt to be vasovagal in etiology, DM2, HTN, and HLD who presents for follow-up of HFpEF.   She was admitted to the hospital in 05/2022 after having been found on the ground next to her bed.  This was preceded by at least 1 day of dizziness, nausea, abdominal pain following a prior admission a few weeks earlier for RSV and COPD exacerbation.  She denied chest pain.  High-sensitivity troponin peaked at 964.  Presentation was not felt to be consistent with ACS.  Echo showed normal LVEF without regional wall motion abnormalities.  Following discussion with patient and her daughter, no invasive procedures were pursued.  She was seen in hospital follow-up on 06/27/2022 and was feeling back to her baseline.  She had not had any further falls.  She was without symptoms of angina or cardiac decompensation.  She did feel somewhat bloated and noted some mild intermittent lower extremity swelling and wheezing following return to assisted living.  It was recommended she take furosemide 20 mg daily rather than as needed.  She was last seen in the office in 07/2022 and remained without symptoms of angina or cardiac decompensation.  She did feel like daily furosemide improved her lower extremity swelling some.  She was not drinking a significant amount of fluid and also had volume loss through diarrhea.  Given this, furosemide was reduced to 20 mg every other day.  She comes in accompanied by her daughter today and is doing well from a cardiac perspective, without symptoms of angina or  cardiac decompensation.  She feels like every other day dosed furosemide has done a good job and maintaining euvolemia.  No palpitations, dizziness, presyncope, or syncope.  No falls or symptoms concerning for bleeding.  She does continue to note diarrhea since her hospitalization.  Daughter has noted some cognitive decline.  Weight stable by our scale.   Labs independently reviewed: 08/2022 - BUN 28, serum creatinine 0.9, potassium 4.5 07/2022 - albumin 3.9, AST/ALT normal, Hgb 12.8, PLT 267, A1c 7.9, TC 240, TG 199, HDL 39, LDL 165 04/2022 - TSH normal  Past Medical History:  Diagnosis Date   Acute pancreatitis 07/24/2021   Basal cell carcinoma 03/30/2008   left upper arm/excision   Basal cell carcinoma 03/31/2008   left upper arm, right lower leg   Basal cell carcinoma 01/18/2009   left upper arm   Collagenous colitis    Diabetes mellitus without complication (HCC)    Diverticulitis    Elevated LFTs 07/25/2021   Heart murmur    at birth, none since   History of echocardiogram    a. 11/2015: echo showing EF of 60-65% with no WMA. Grade 1 DD and mild MR noted.    Hyperlipidemia    Per [facility]   Hypertension    Intertrochanteric fracture of right femur (HCC) 06/11/2019   Lack of coordination    Per [facility] term current use of anticoagulant    Per [facility]   Noninfective gastroenteritis and colitis, unspecified  Per [facility]   PAF (paroxysmal atrial fibrillation) (HCC)    a. initially diagnosed in 08/2016 --> started on Eliquis   Squamous cell carcinoma of skin 05/16/2020   Left nasal tip anterior to ala, refer to [facility]   Syncope    a. initially occurring in Fall 2016 b. Hospitalized in 10/2015 and 11/2015 for recurrent episodes.     Past Surgical History:  Procedure Laterality Date   ABDOMINAL HYSTERECTOMY     BACK SURGERY  08/08/2008   Lumbar discectomy   COLONOSCOPY WITH PROPOFOL     EXCISION/RELEASE BURSA HIP Left 05/03/2019   Procedure: HIP ABDUCTOR  REPAIR;  Surgeon: Kennedy Bucker, MD;  Location: [facility] ORS;  Service: Orthopedics;  Laterality: Left;   INTRAMEDULLARY (IM) NAIL INTERTROCHANTERIC Right 06/13/2019   Procedure: INTRAMEDULLARY (IM) NAIL INTERTROCHANTRIC;  Surgeon: Kennedy Bucker, MD;  Location: [facility] ORS;  Service: Orthopedics;  Laterality: Right;   NECK SURGERY  1980    Current Medications: Current Meds  Medication Sig   acetaminophen (TYLENOL) 325 MG tablet Take 650 mg by mouth every 4 (four) hours as needed for mild pain or moderate pain.   albuterol (VENTOLIN HFA) 108 (90 Base) MCG/ACT inhaler Inhale 2 puffs into the lungs every 6 (six) hours as needed for wheezing or shortness of breath.   alum & mag hydroxide-simeth (MAALOX/MYLANTA) 200-200-20 MG/5 SUSP Give 30ml by mouth every 4 hours as needed.   apixaban (ELIQUIS) 5 MG TABS tablet Take 1 tablet (5 mg total) by mouth 2 (two) times daily.   benzonatate (TESSALON PERLES) 100 MG capsule Take 1 capsule (100 mg total) by mouth 3 (three) times daily as needed for cough.   bisacodyl (DULCOLAX) 10 MG suppository Place 10 mg rectally as needed for moderate constipation.   bismuth subsalicylate (PEPTO BISMOL) 262 MG/15ML suspension Give 10 cc by mouth as needed for loose stool supervised self-administration   carbamide peroxide (DEBROX) 6.5 % OTIC solution Place 5 drops into both ears as needed.   cetirizine (ZYRTEC) 10 MG tablet Take 10 mg by mouth daily as needed for allergies.   Cholecalciferol (VITAMIN D) 50 MCG (2000 UT) CAPS Give one capsule by mouth once daily .   Clobetasol Propionate 0.05 % shampoo Apply 1 Application topically every morning. And wash out   dextromethorphan-guaiFENesin (ROBITUSSIN-DM) 10-100 MG/5ML liquid Give 2 tsp by mouth every 4 hours as needed for coughing. supervised self-administration Notify MD if no relief in 3 days or continued use   dextrose (GLUTOSE) 40 % GEL Take 1 Tube by mouth as needed for low blood sugar.   fluocinolone (SYNALAR) 0.01 %  external solution Apply 1 Application topically at bedtime. Apply to scalp   furosemide (LASIX) 20 MG tablet Take 20 mg by mouth every other day. Give one tablet by mouth as needed.   glipiZIDE (GLUCOTROL XL) 10 MG 24 hr tablet Take 10 mg by mouth 2 (two) times daily.   Infant Care Products [facility] EX) Apply to buttocks/periarea topically every shift   ipratropium-albuterol (DUONEB) 0.5-2.5 (3) MG/3ML SOLN Take 3 mLs by nebulization every 4 (four) hours as needed.   linagliptin (TRADJENTA) 5 MG TABS tablet Take 1 tablet (5 mg total) by mouth daily.   lisinopril (ZESTRIL) 20 MG tablet TAKE 1 TABLET BY MOUTH DAILY   magnesium hydroxide (MILK OF MAGNESIA) 400 MG/5ML suspension Give 2 Tbsp by mouth as needed for constipation. supervised self-administration daily -- Call MD if no relief in 3 days of continued use   metFORMIN (GLUCOPHAGE-XR)  500 MG 24 hr tablet Take 1 tablet (500 mg total) by mouth 2 (two) times daily.   metoprolol succinate (TOPROL-XL) 25 MG 24 hr tablet TAKE 1 TABLET BY MOUTH ONCE DAILY   Multiple Vitamin (MULTIVITAMIN WITH MINERALS) TABS tablet Take 1 tablet by mouth daily.   nystatin (MYCOSTATIN/NYSTOP) powder Apply to excoriated area topically as needed Twice daily.   ondansetron (ZOFRAN) 4 MG tablet Take 4 mg by mouth every 8 (eight) hours as needed for nausea or vomiting.   OXYGEN 2lpm as needed for dyspnea/SOB   polyethylene glycol (MIRALAX / GLYCOLAX) 17 g packet Take 17 g by mouth daily as needed.   [DISCONTINUED] atorvastatin (LIPITOR) 40 MG tablet Take 1 tablet (40 mg total) by mouth daily.    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  Tobacco Use   Smoking status: Former    Current  packs/day: 0.00    Types: Cigarettes    Quit date: 03/09/1978    Years since quitting: 44.6   Smokeless tobacco: Never  Vaping Use   Vaping status: Never Used  Substance and Sexual Activity   Alcohol use: Not Currently    Comment: once a month--mixed drink   Drug use: No   Sexual activity: Not on file  Other Topics Concern   Not on file  Social History Narrative   2 daughters and a son (1 local plus [place] and [place])      Has living will    Daughter Eunice Blase is [facility] POA   Has DNR   No feeding tube   Social Determinants of Health   Financial Resource Strain: Low Risk  (04/25/2020)   Overall Financial Resource Strain (CARDIA)    Difficulty of Paying Living Expenses: Not hard at all  Food Insecurity: No Food Insecurity (04/25/2020)   Hunger Vital Sign    Worried About Running Out of Food in the Last Year: Never true    Ran Out of Food in the Last Year: Never true  Transportation Needs: No Transportation Needs (05/11/2022)   PRAPARE - Administrator, Civil Service (Medical): No    Lack of Transportation (Non-Medical): No  Physical Activity: Inactive (04/25/2020)   Exercise Vital Sign    Days of Exercise per Week: 0 days    Minutes of Exercise per Session: 0 min  Stress: No Stress Concern Present (04/25/2020)   [employer] of Occupational Health - Occupational Stress Questionnaire    Feeling of Stress : Not at all  Social Connections: Moderately Isolated (04/25/2020)   Social Connection and Isolation Panel [NHANES]    Frequency of Communication with Friends and Family: More than three times a week    Frequency of Social Gatherings with Friends and Family: More than three times a week    Attends Religious Services: More than 4 times per year    Active Member of [employer] or Organizations: No    Attends Banker Meetings: Never    Marital Status: Widowed     Family History:  The patient's family history includes Diabetes in her mother;  Hypertension in her mother; Lung cancer in her father.  ROS:   12-point review of systems is  negative unless otherwise noted in the HPI.   EKGs/Labs/Other Studies Reviewed:    Studies reviewed were summarized above. The additional studies were reviewed today:  2D echo 05/10/2022: 1. Left ventricular ejection fraction, by estimation, is 55 to 60%. The  left ventricle has normal function. The left ventricle has no regional  wall motion abnormalities. There is mild left ventricular hypertrophy.  Left ventricular diastolic parameters  are indeterminate.   2. Right ventricular systolic function is normal. The right ventricular  size is normal.   3. Right atrial size was mildly dilated.   4. The mitral valve is normal in structure. No evidence of mitral valve  regurgitation.   5. The aortic valve was not well visualized. Aortic valve regurgitation  is not visualized. No aortic stenosis is present.   6. The inferior vena cava is normal in size with greater than 50%  respiratory variability, suggesting right atrial pressure of 3 mmHg.  __________   2D echo 12/18/2020: 1. Left ventricular ejection fraction, by estimation, is 55 to 60%. The  left ventricle has normal function. The left ventricle has no regional  wall motion abnormalities. Left ventricular diastolic parameters are  consistent with Grade I diastolic  dysfunction (impaired relaxation).   2. Right ventricular systolic function is normal. The right ventricular  size is normal.   3. The mitral valve is normal in structure. No evidence of mitral valve  regurgitation.   4. The aortic valve was not well visualized. Aortic valve regurgitation  is not visualized.   5. The inferior vena cava is normal in size with greater than 50%  respiratory variability, suggesting right atrial pressure of 3 mmHg.   Comparison(s): LVEF 55-60%.  __________   Eugenie Birks MPI 10/26/2019: Pharmacological myocardial perfusion imaging study with no  significant  ischemia Normal wall motion, EF estimated at 61% No EKG changes concerning for ischemia at peak stress or in recovery. CT attenuation correction images with mild coronary calcification and aortic atherosclerosis Low risk scan ___________   Limited echo 06/14/2019: 1. Left ventricular ejection fraction, by estimation, is 55 to 60%. The  left ventricle has normal function. The left ventricle demonstrates  regional wall motion abnormalities (see scoring diagram/findings for  description). There is mild left ventricular   hypertrophy. Left ventricular diastolic parameters are consistent with  Grade I diastolic dysfunction (impaired relaxation). There is mild  hypokinesis of the left ventricular, basal-mid inferior wall and  inferoseptal wall.   2. There is moderately elevated pulmonary artery systolic pressure.  __________   2D echo 04/21/2019: 1. Left ventricular ejection fraction, by estimation, is 55 to 60%. The  left ventricle has normal function. The left ventricle has no regional  wall motion abnormalities. Left ventricular diastolic parameters are  consistent with Grade I diastolic  dysfunction (impaired relaxation).   2. Right ventricular systolic function is normal. The right ventricular  size is normal.  __________   Eugenie Birks MPI 01/08/2016: No significant ischemia. Small apical anterior and apical defect is present, more pronounced on ther rest images. This most likely represents shifting breast attenuation but cannot rule out small area of scar. The left ventricular ejection fraction is normal (55-65%). Study is degraded by extracardiac activity and motion. This is a low risk study. __________   Event monitor 11/2015: Monitoring period was 30 days. The predominant rhythm was sinus with an average heart rate of 68 bpm (range 45 -134 bpm). Occasional PAC's and PVC's were noted. Two episodes of supraventricular tachycardia were identified, lasting up  to 14 beats  with a maximum rate of 136 bpm. No sustained arrhythmias were identified. Reported symptoms of "tired or fatigued" corresponded to sinus rhythm and sinus rhythm with PAC's __________   Carotid artery ultrasound 11/22/2015: IMPRESSION: No hemodynamically significant stenosis or plaque is noted in either cervical carotid artery. __________   2D echo 11/21/2015: - Left ventricle: The cavity size was normal. Systolic function was    normal. The estimated ejection fraction was in the range of 60%    to 65%. Wall motion was normal; there were no regional wall    motion abnormalities. Doppler parameters are consistent with    abnormal left ventricular relaxation (grade 1 diastolic    dysfunction).  - Mitral valve: There was mild regurgitation.  - Left atrium: The atrium was normal in size.  - Right ventricle: Systolic function was normal.  - Pulmonary arteries: Systolic pressure was within the normal    range.   Impressions:   - Normal study.    EKG:  EKG is ordered today.  The EKG ordered today demonstrates NSR, 74 bpm, baseline wandering and artifact, no acute st/t changes  Recent Labs: 04/17/2022: TSH 2.284 05/10/2022: Magnesium 1.6 08/07/2022: ALT 15; Hemoglobin 12.8; Platelets 267 08/14/2022: BUN 28; Creatinine 0.9; Potassium 4.5; Sodium 139  Recent Lipid Panel    Component Value Date/Time   CHOL 240 (A) 08/07/2022 0000   CHOL 258 (H) 04/29/2021 1300   TRIG 199 (A) 08/07/2022 0000   HDL 39 08/07/2022 0000   HDL 38 (L) 04/29/2021 1300   CHOLHDL 5.2 05/10/2022 0836   VLDL 42 (H) 05/10/2022 0836   LDLCALC 165 08/07/2022 0000   LDLCALC 146 (H) 04/29/2021 1300    PHYSICAL EXAM:    VS:  BP 138/64 (BP Location: Left Arm, Patient Position: Sitting, Cuff Size: Normal)   Pulse 74   Ht 5\' 1"  (1.549 m)   Wt 167 lb 3.2 oz (75.8 kg)   SpO2 94%   BMI 31.59 kg/m   BMI: Body mass index is 31.59 kg/m.  Physical Exam Vitals reviewed.  Constitutional:      Appearance: She is  well-developed.  HENT:     Head: Normocephalic and atraumatic.  Eyes:     General:        Right eye: No discharge.        Left eye: No discharge.  Neck:     Vascular: No JVD.  Cardiovascular:     Rate and Rhythm: Normal rate and regular rhythm.     Heart sounds: Normal heart sounds, S1 normal and S2 normal. Heart sounds not distant. No midsystolic click and no opening snap. No murmur heard.    No friction rub.  Pulmonary:     Effort: Pulmonary effort is normal. No respiratory distress.     Breath sounds: Normal breath sounds. No decreased breath sounds, wheezing or rales.  Chest:     Chest wall: No tenderness.  Abdominal:     General: There is no distension.  Musculoskeletal:     Cervical back: Normal range of motion.     Right lower leg: No edema.     Left lower leg: No edema.     Comments: Chronic hyperpigmentation along the bilateral ankles.   Skin:    General: Skin is warm and dry.     Nails: There is no clubbing.  Neurological:     Mental Status: She is alert and oriented to person, place, and time.  Psychiatric:  Speech: Speech normal.        Behavior: Behavior normal.        Thought Content: Thought content normal.        Judgment: Judgment normal.     Wt Readings from Last 3 Encounters:  10/21/22 167 lb 3.2 oz (75.8 kg)  09/18/22 168 lb (76.2 kg)  08/29/22 196 lb (88.9 kg)     ASSESSMENT & PLAN:   HFpEF: Euvolemic and well compensated.  Remains on furosemide 20 mg every other day with stable labs and weight.  Defer addition of MRA or SGLT2 inhibitor given concern for off target effect in the setting of her advanced age.  Sodium restriction and leg elevation encouraged.  Elevated troponin/HLD: Favored to be a type II event (supply/demand mismatch ischemia) given lack of chest pain and a normal LVEF without wall motion abnormality on echo.  Additional ischemic testing has been deferred given the patient's advanced age, and the setting of her and her  family's desire to avoid any invasive procedures.  Remains on apixaban given A-fib as outlined below.  Undergoing trial of holding atorvastatin as outlined below.  PAF: Maintaining sinus rhythm with metoprolol succinate.  CHA2DS2-VASc at least 6 (CHF, HTN, age x 2, DM2, sex category).  She remains on apixaban 5 mg twice daily and does not meet reduced dosing criteria.  No bleeding concerns or falls.  Recent labs stable.  HTN: Blood pressure well-controlled in the office today.  She remains on lisinopril and metoprolol succinate.  Diarrhea: Persistent since hospitalization.  Undergo a trial of holding atorvastatin.    Disposition: F/u with Dr. Okey Dupre or an APP in 4 months.   Medication Adjustments/Labs and Tests Ordered: Current medicines are reviewed at length with the patient today.  Concerns regarding medicines are outlined above. Medication changes, Labs and Tests ordered today are summarized above and listed in the Patient Instructions accessible in Encounters.   Signed, Eula Listen, PA-C 10/21/2022 3:42 PM     [facility] [place] [address] [place], [place] [address] [phone]

## 2022-10-21 ENCOUNTER — Encounter: Payer: Self-pay | Admitting: Physician Assistant

## 2022-10-21 ENCOUNTER — Ambulatory Visit: Payer: Medicare Other | Attending: Physician Assistant | Admitting: Physician Assistant

## 2022-10-21 VITALS — BP 138/64 | HR 74 | Ht 61.0 in | Wt 167.2 lb

## 2022-10-21 DIAGNOSIS — I5032 Chronic diastolic (congestive) heart failure: Secondary | ICD-10-CM

## 2022-10-21 DIAGNOSIS — R197 Diarrhea, unspecified: Secondary | ICD-10-CM

## 2022-10-21 DIAGNOSIS — I10 Essential (primary) hypertension: Secondary | ICD-10-CM

## 2022-10-21 DIAGNOSIS — R7989 Other specified abnormal findings of blood chemistry: Secondary | ICD-10-CM | POA: Diagnosis not present

## 2022-10-21 DIAGNOSIS — E785 Hyperlipidemia, unspecified: Secondary | ICD-10-CM | POA: Diagnosis not present

## 2022-10-21 DIAGNOSIS — I480 Paroxysmal atrial fibrillation: Secondary | ICD-10-CM

## 2022-10-21 NOTE — Patient Instructions (Signed)
Medication Instructions:  Your physician recommends the following medication changes.  STOP TAKING: Lipitor (Atorvastatin)   *If you need a refill on your cardiac medications before your next appointment, please call your pharmacy*   Lab Work: none If you have labs (blood work) drawn today and your tests are completely normal, you will receive your results only by: MyChart Message (if you have MyChart) OR A paper copy in the mail If you have any lab test that is abnormal or we need to change your treatment, we will call you to review the results.   Testing/Procedures: none   Follow-Up: At [facility], you and your health needs are our priority.  As part of our continuing mission to provide you with exceptional heart care, we have created designated Provider Care Teams.  These Care Teams include your primary Cardiologist (physician) and Advanced Practice Providers (APPs -  Physician Assistants and Nurse Practitioners) who all work together to provide you with the care you need, when you need it.  We recommend signing up for the patient portal called "MyChart".  Sign up information is provided on this After Visit Summary.  MyChart is used to connect with patients for Virtual Visits (Telemedicine).  Patients are able to view lab/test results, encounter notes, upcoming appointments, etc.  Non-urgent messages can be sent to your provider as well.   To learn more about what you can do with MyChart, go to [email or web address].    Your next appointment:   4 month(s)  Provider:   Eula Listen, PA-C

## 2022-10-27 ENCOUNTER — Telehealth: Payer: Self-pay | Admitting: Internal Medicine

## 2022-10-27 NOTE — Telephone Encounter (Signed)
Daughter called to say the the pill that R\yan told the patient to stop taking. Has help the patient, wanted to make him aware. Please advise

## 2022-10-27 NOTE — Telephone Encounter (Signed)
Please inform the patient I am glad holding atorvastatin has helped her symptoms.  We can revisit potential alternative therapies at follow-up.  Would prefer not to add a new medication right as she is beginning to feel better.

## 2022-10-27 NOTE — Telephone Encounter (Signed)
Spoke with patient's daughter and relayed the following message to her from [employer] as follows:  "Please inform the patient I am glad holding atorvastatin has helped her symptoms.  We can revisit potential alternative therapies at follow-up.  Would prefer not to add a new medication right as she is beginning to feel better."  Patient's daughter understood with read back

## 2022-11-17 ENCOUNTER — Non-Acute Institutional Stay: Payer: Medicare Other | Admitting: Student

## 2022-11-17 ENCOUNTER — Encounter: Payer: Self-pay | Admitting: Student

## 2022-11-17 DIAGNOSIS — I480 Paroxysmal atrial fibrillation: Secondary | ICD-10-CM | POA: Diagnosis not present

## 2022-11-17 DIAGNOSIS — E785 Hyperlipidemia, unspecified: Secondary | ICD-10-CM | POA: Diagnosis not present

## 2022-11-17 DIAGNOSIS — I10 Essential (primary) hypertension: Secondary | ICD-10-CM

## 2022-11-17 DIAGNOSIS — I252 Old myocardial infarction: Secondary | ICD-10-CM

## 2022-11-17 DIAGNOSIS — E1159 Type 2 diabetes mellitus with other circulatory complications: Secondary | ICD-10-CM

## 2022-11-17 DIAGNOSIS — Z66 Do not resuscitate: Secondary | ICD-10-CM

## 2022-11-17 NOTE — Progress Notes (Signed)
Location:  Other [facility]) Nursing Home Room Number: [name] [id number] Place of Service:  ALF [phone]) Provider:  [name], MD  Patient Care Team: [name], MD as PCP - General (Family Medicine) End, Cristal Deer, MD as PCP - Cardiology (Cardiology) Lockie Mola, MD as Referring Physician (Ophthalmology) Sharon Seller, NP as Nurse Practitioner (Geriatric Medicine) Pa, [facility])  Extended Emergency Contact Information Primary Emergency Contact: [name] Address: [address]          [place], [place] [address] [name] of [place] Home Phone: [phone] Work Phone: [phone] Relation: Daughter  Code Status:  DNR Goals of care: Advanced Directive information    11/17/2022    2:34 PM  Advanced Directives  Does Patient Have a Medical Advance Directive? Yes  Type of Advance Directive Out of facility DNR (pink MOST or yellow form)  Does patient want to make changes to medical advance directive? No - Patient declined  Pre-existing out of facility DNR order (yellow form or pink MOST form) Yellow form placed in chart (order not valid for inpatient use)     Chief Complaint  Patient presents with   Medical Management of Chronic Issues    Routine Visit   Immunizations    Shingrix, Influenza, and Covid   Quality Metric Gaps    Eye Exam    HPI:  Pt is a 89 y.o. female seen today for medical management of chronic diseases.   She has some shortness of breath occasionally. Nothing that overpowers her.   Her weight has been stable.   Nothing wrong with her eating. No problem with BM or Urination.   She has had improvement of diarrhea wihtout atorvastatin on board.   Occasionally will get an additional dose of lasix. Takes it every other day.   Diabetes - for her she doesn't want to take more, but she will take more if she needs to.    She is going out with her daughter Wednesday.   Past Medical History:   Diagnosis Date   Acute pancreatitis 07/24/2021   Basal cell carcinoma 03/30/2008   left upper arm/excision   Basal cell carcinoma 03/31/2008   left upper arm, right lower leg   Basal cell carcinoma 01/18/2009   left upper arm   Collagenous colitis    Diabetes mellitus without complication (HCC)    Diverticulitis    Elevated LFTs 07/25/2021   Heart murmur    at birth, none since   History of echocardiogram    a. 11/2015: echo showing EF of 60-65% with no WMA. Grade 1 DD and mild MR noted.    Hyperlipidemia    Per [facility]   Hypertension    Intertrochanteric fracture of right femur (HCC) 06/11/2019   Lack of coordination    Per [facility] term current use of anticoagulant    Per [facility]   Noninfective gastroenteritis and colitis, unspecified    Per [facility]   PAF (paroxysmal atrial fibrillation) (HCC)    a. initially diagnosed in 08/2016 --> started on Eliquis   Squamous cell carcinoma of skin 05/16/2020   Left nasal tip anterior to ala, refer to [facility]   Syncope    a. initially occurring in Fall 2016 b. Hospitalized in 10/2015 and 11/2015 for recurrent episodes.    Past Surgical History:  Procedure Laterality Date   ABDOMINAL HYSTERECTOMY     BACK SURGERY  08/08/2008   Lumbar discectomy   COLONOSCOPY WITH PROPOFOL  EXCISION/RELEASE BURSA HIP Left 05/03/2019   Procedure: HIP ABDUCTOR REPAIR;  Surgeon: Kennedy Bucker, MD;  Location: [facility] ORS;  Service: Orthopedics;  Laterality: Left;   INTRAMEDULLARY (IM) NAIL INTERTROCHANTERIC Right 06/13/2019   Procedure: INTRAMEDULLARY (IM) NAIL INTERTROCHANTRIC;  Surgeon: Kennedy Bucker, MD;  Location: [facility] ORS;  Service: Orthopedics;  Laterality: Right;   NECK SURGERY  1980    Allergies  Allergen Reactions   Morphine Sulfate Other (See Comments)    BP bottoms out   Penicillins Diarrhea    Has patient had a PCN reaction causing immediate rash, facial/tongue/throat swelling, SOB or lightheadedness with hypotension:  Yes Has patient had a PCN reaction causing severe rash involving mucus membranes or skin necrosis: Yes Has patient had a PCN reaction that required hospitalization No Has patient had a PCN reaction occurring within the last 10 years: No If all of the above answers are "NO", then may proceed with Cephalosporin use.    Outpatient Encounter Medications as of 11/17/2022  Medication Sig   acetaminophen (TYLENOL) 325 MG tablet Take 650 mg by mouth every 4 (four) hours as needed for mild pain or moderate pain.   albuterol (VENTOLIN HFA) 108 (90 Base) MCG/ACT inhaler Inhale 2 puffs into the lungs every 6 (six) hours as needed for wheezing or shortness of breath.   alum & mag hydroxide-simeth (MAALOX/MYLANTA) 200-200-20 MG/5 SUSP Give 30ml by mouth every 4 hours as needed.   apixaban (ELIQUIS) 5 MG TABS tablet Take 1 tablet (5 mg total) by mouth 2 (two) times daily.   benzonatate (TESSALON PERLES) 100 MG capsule Take 1 capsule (100 mg total) by mouth 3 (three) times daily as needed for cough.   bisacodyl (DULCOLAX) 10 MG suppository Place 10 mg rectally as needed for moderate constipation.   bismuth subsalicylate (PEPTO BISMOL) 262 MG/15ML suspension Give 10 cc by mouth as needed for loose stool supervised self-administration   carbamide peroxide (DEBROX) 6.5 % OTIC solution Place 5 drops into both ears as needed.   cetirizine (ZYRTEC) 10 MG tablet Take 10 mg by mouth daily as needed for allergies.   Cholecalciferol (VITAMIN D) 50 MCG (2000 UT) CAPS Give one capsule by mouth once daily .   Clobetasol Propionate 0.05 % shampoo Apply 1 Application topically every morning. And wash out   dextromethorphan-guaiFENesin (ROBITUSSIN-DM) 10-100 MG/5ML liquid Give 2 tsp by mouth every 4 hours as needed for coughing. supervised self-administration Notify MD if no relief in 3 days or continued use   dextrose (GLUTOSE) 40 % GEL Take 1 Tube by mouth as needed for low blood sugar.   fluocinolone (SYNALAR) 0.01 %  external solution Apply 1 Application topically at bedtime. Apply to scalp   furosemide (LASIX) 20 MG tablet Take 20 mg by mouth every other day. Give one tablet by mouth as needed.   glipiZIDE (GLUCOTROL XL) 10 MG 24 hr tablet Take 10 mg by mouth 2 (two) times daily.   Infant Care Products [facility] EX) Apply to buttocks/periarea topically every shift   ipratropium-albuterol (DUONEB) 0.5-2.5 (3) MG/3ML SOLN Take 3 mLs by nebulization every 4 (four) hours as needed.   linagliptin (TRADJENTA) 5 MG TABS tablet Take 1 tablet (5 mg total) by mouth daily.   lisinopril (ZESTRIL) 20 MG tablet TAKE 1 TABLET BY MOUTH DAILY   magnesium hydroxide (MILK OF MAGNESIA) 400 MG/5ML suspension Give 2 Tbsp by mouth as needed for constipation. supervised self-administration daily -- Call MD if no relief in 3 days of continued use   metFORMIN (  GLUCOPHAGE-XR) 500 MG 24 hr tablet Take 1 tablet (500 mg total) by mouth 2 (two) times daily.   metoprolol succinate (TOPROL-XL) 25 MG 24 hr tablet TAKE 1 TABLET BY MOUTH ONCE DAILY   Multiple Vitamin (MULTIVITAMIN WITH MINERALS) TABS tablet Take 1 tablet by mouth daily.   nystatin (MYCOSTATIN/NYSTOP) powder Apply to excoriated area topically as needed Twice daily.   ondansetron (ZOFRAN) 4 MG tablet Take 4 mg by mouth every 8 (eight) hours as needed for nausea or vomiting.   OXYGEN 2lpm as needed for dyspnea/SOB   polyethylene glycol (MIRALAX / GLYCOLAX) 17 g packet Take 17 g by mouth daily as needed.   No facility-administered encounter medications on file as of 11/17/2022.    Review of Systems  Immunization History  Administered Date(s) Administered   Covid-19, Mrna,Vaccine(Spikevax)33yrs and older 06/17/2022   Fluad Quad(high Dose 65+) 12/20/2018   Influenza Split 01/01/2009, 01/02/2010, 01/13/2011, 12/22/2011   Influenza, High Dose Seasonal PF 01/18/2014, 01/01/2015, 11/30/2015, 12/01/2016, 01/11/2018, 12/08/2019   Influenza-Unspecified 01/08/2013, 11/08/2020,  12/25/2021   Moderna Covid-19 Vaccine Bivalent Booster 69yrs & up 11/30/2020, 08/06/2021, 01/17/2022   PFIZER Comirnaty(Gray Top)Covid-19 Tri-Sucrose Vaccine 07/27/2020   PFIZER(Purple Top)SARS-COV-2 Vaccination 04/01/2019, 04/22/2019, 12/05/2019   Pfizer Covid-19 Vaccine Bivalent Booster 16yrs & up 11/30/2020   Pneumococcal Conjugate-13 10/19/2013   Pneumococcal Polysaccharide-23 01/02/2010   Td 11/02/2003, 01/13/2011   Tdap 01/13/2011, 02/28/2015   Zoster, Live 07/14/2007   Pertinent  Health Maintenance Due  Topic Date Due   OPHTHALMOLOGY EXAM  06/08/2021   INFLUENZA VACCINE  10/09/2022   HEMOGLOBIN A1C  02/07/2023   FOOT EXAM  08/06/2023   DEXA SCAN  Completed      07/24/2021    4:09 PM 07/24/2021   10:50 PM 07/25/2021   10:02 AM 07/26/2021    1:00 AM 09/18/2022    9:32 AM  Fall Risk  Falls in the past year?     0  (RETIRED) Patient Fall Risk Level Moderate fall risk High fall risk High fall risk High fall risk    Functional Status Survey:    Vitals:   11/17/22 1420  BP: 120/72  Pulse: 70  Resp: 18  Temp: 98 F (36.7 C)  SpO2: 96%  Weight: 173 lb (78.5 kg)  Height: 5\' 1"  (1.549 m)   Body mass index is 32.69 kg/m. Physical Exam Constitutional:      Appearance: Normal appearance.  Cardiovascular:     Rate and Rhythm: Normal rate and regular rhythm.  Pulmonary:     Effort: Pulmonary effort is normal.     Breath sounds: Normal breath sounds.  Abdominal:     General: Abdomen is flat. Bowel sounds are normal.  Neurological:     Mental Status: She is alert and oriented to person, place, and time.     Labs reviewed: Recent Labs    05/10/22 0104 05/11/22 0313 05/12/22 0533 05/13/22 0704 05/14/22 0351 07/24/22 0000 08/07/22 0000 08/14/22 0000  NA 136   < > 137 134* 137 140 140 139  K 3.4*   < > 3.6 3.7 3.7 4.5 4.6 4.5  CL 103   < > 104 105 104 106 105 105  CO2 24   < > 23 23 24  27* 30* 28*  GLUCOSE 246*   < > 205* 203* 231*  --   --   --   BUN 24*   <  > 13 14 19  23* 31* 28*  CREATININE 0.79   < > 0.66 0.72 0.78  0.9 1.0 0.9  CALCIUM 8.7*   < > 8.5* 8.5* 8.6* 9.2 9.4 9.4  MG 1.6*  --   --   --   --   --   --   --    < > = values in this interval not displayed.   Recent Labs    05/12/22 0533 05/13/22 0704 05/14/22 0351 08/07/22 0000  AST 162* 67* 40 13  ALT 308* 219* 169* 15  ALKPHOS 122 142* 136* 44  BILITOT 1.2 0.9 0.6  --   PROT 5.4* 5.5* 5.7*  --   ALBUMIN 2.5* 2.6* 2.6* 3.9   Recent Labs    05/12/22 0533 05/13/22 0704 05/14/22 0351 08/07/22 0000  WBC 6.1 8.3 8.3 6.6  NEUTROABS 3.0 3.7 3.1 2,191.00  HGB 11.4* 11.8* 11.5* 12.8  HCT 34.9* 35.9* 34.8* 38  MCV 89.0 89.8 88.3  --   PLT 205 223 241 267   Lab Results  Component Value Date   TSH 2.284 04/17/2022   Lab Results  Component Value Date   HGBA1C 7.9 08/07/2022   Lab Results  Component Value Date   CHOL 240 (A) 08/07/2022   HDL 39 08/07/2022   LDLCALC 165 08/07/2022   TRIG 199 (A) 08/07/2022   CHOLHDL 5.2 05/10/2022    Significant Diagnostic Results in last 30 days:  No results found.  Assessment/Plan Hx of non-ST elevation myocardial infarction (NSTEMI)  Paroxysmal atrial fibrillation (HCC)  Type 2 diabetes mellitus with other circulatory complication, without long-term current use of insulin (HCC)  Dyslipidemia  Essential hypertension  DNR (do not resuscitate) Patient w/ hx of MI w/o cardiac catheterization. Prefers to stay in facility. Rate controlled. Continue anticoagulation. DM well-controlled, recheck today. Discontinued atorvastatin secondary to diarrhea. Continue current medications and supportive care.   Family/ staff Communication: nursing  Labs/tests ordered:  A1c, CMP, CBC

## 2022-11-20 LAB — COMPREHENSIVE METABOLIC PANEL WITH GFR
Albumin: 4 (ref 3.5–5.0)
Calcium: 9.4 (ref 8.7–10.7)
Globulin: 2.9
eGFR: 57

## 2022-11-20 LAB — LIPID PANEL
Cholesterol: 241 — AB (ref 0–200)
HDL: 43 (ref 35–70)
LDL Cholesterol: 165
Triglycerides: 179 — AB (ref 40–160)

## 2022-11-20 LAB — HEPATIC FUNCTION PANEL
ALT: 17 U/L (ref 7–35)
AST: 13 (ref 13–35)
Alkaline Phosphatase: 62 (ref 25–125)
Bilirubin, Total: 0.6

## 2022-11-20 LAB — BASIC METABOLIC PANEL WITH GFR
BUN: 32 — AB (ref 4–21)
CO2: 30 — AB (ref 13–22)
Chloride: 104 (ref 99–108)
Creatinine: 1 (ref 0.5–1.1)
Glucose: 197
Potassium: 4.5 meq/L (ref 3.5–5.1)
Sodium: 141 (ref 137–147)

## 2022-11-20 LAB — HEMOGLOBIN A1C: Hemoglobin A1C: 7.5

## 2022-11-26 ENCOUNTER — Encounter: Payer: Self-pay | Admitting: Student

## 2022-11-26 ENCOUNTER — Non-Acute Institutional Stay: Payer: Medicare Other | Admitting: Student

## 2022-11-26 DIAGNOSIS — R197 Diarrhea, unspecified: Secondary | ICD-10-CM

## 2022-11-26 DIAGNOSIS — E1169 Type 2 diabetes mellitus with other specified complication: Secondary | ICD-10-CM | POA: Diagnosis not present

## 2022-11-26 DIAGNOSIS — E785 Hyperlipidemia, unspecified: Secondary | ICD-10-CM

## 2022-11-26 NOTE — Progress Notes (Signed)
Location:  Other [facility].  Nursing Home Room Number: [name] [id number] Place of Service:  ALF [phone]) Provider:  [name], MD  Patient Care Team: [name], MD as PCP - General (Family Medicine) End, Cristal Deer, MD as PCP - Cardiology (Cardiology) Lockie Mola, MD as Referring Physician (Ophthalmology) Sharon Seller, NP as Nurse Practitioner (Geriatric Medicine) Pa, [facility])  Extended Emergency Contact Information Primary Emergency Contact: [name] Address: [address]          [place], [place] [address] [name] of [place] Home Phone: [phone] Work Phone: [phone] Relation: Daughter  Code Status:  DNR Goals of care: Advanced Directive information    11/26/2022    1:15 PM  Advanced Directives  Does Patient Have a Medical Advance Directive? Yes  Type of Advance Directive Out of facility DNR (pink MOST or yellow form)  Does patient want to make changes to medical advance directive? No - Patient declined     Chief Complaint  Patient presents with   Acute Visit    Diarrhea.     HPI:  Pt is a 89 y.o. female seen today for an acute visit for Diarrhea.   Paitent has had diarrhea on and off since June 2024. She doesn't know how often she is having diarrhea. Nursing states patient has had significant help with cleaning diarrhea 2x per week. Patient drinks coffee daily. Has ice cream periodically during the week. Previous improvement with discontinuation of atorvastatin. Discussed labs, cholesterol elevated. Okay with trial of another medication for cholsterol. Patient declines colonoscopy vehemently.    Past Medical History:  Diagnosis Date   Acute pancreatitis 07/24/2021   Basal cell carcinoma 03/30/2008   left upper arm/excision   Basal cell carcinoma 03/31/2008   left upper arm, right lower leg   Basal cell carcinoma 01/18/2009   left upper arm   Collagenous colitis    Diabetes mellitus without  complication (HCC)    Diverticulitis    Elevated LFTs 07/25/2021   Heart murmur    at birth, none since   History of echocardiogram    a. 11/2015: echo showing EF of 60-65% with no WMA. Grade 1 DD and mild MR noted.    Hyperlipidemia    Per [facility]   Hypertension    Intertrochanteric fracture of right femur (HCC) 06/11/2019   Lack of coordination    Per [facility] term current use of anticoagulant    Per [facility]   Noninfective gastroenteritis and colitis, unspecified    Per [facility]   PAF (paroxysmal atrial fibrillation) (HCC)    a. initially diagnosed in 08/2016 --> started on Eliquis   Squamous cell carcinoma of skin 05/16/2020   Left nasal tip anterior to ala, refer to [facility]   Syncope    a. initially occurring in Fall 2016 b. Hospitalized in 10/2015 and 11/2015 for recurrent episodes.    Past Surgical History:  Procedure Laterality Date   ABDOMINAL HYSTERECTOMY     BACK SURGERY  08/08/2008   Lumbar discectomy   COLONOSCOPY WITH PROPOFOL     EXCISION/RELEASE BURSA HIP Left 05/03/2019   Procedure: HIP ABDUCTOR REPAIR;  Surgeon: Kennedy Bucker, MD;  Location: [facility] ORS;  Service: Orthopedics;  Laterality: Left;   INTRAMEDULLARY (IM) NAIL INTERTROCHANTERIC Right 06/13/2019   Procedure: INTRAMEDULLARY (IM) NAIL INTERTROCHANTRIC;  Surgeon: Kennedy Bucker, MD;  Location: [facility] ORS;  Service: Orthopedics;  Laterality: Right;   NECK SURGERY  1980    Allergies  Allergen Reactions   Morphine Sulfate Other (See Comments)    BP bottoms out   Penicillins Diarrhea    Has patient had a PCN reaction causing immediate rash, facial/tongue/throat swelling, SOB or lightheadedness with hypotension: Yes Has patient had a PCN reaction causing severe rash involving mucus membranes or skin necrosis: Yes Has patient had a PCN reaction that required hospitalization No Has patient had a PCN reaction occurring within the last 10 years: No If all of the above answers are "NO", then may  proceed with Cephalosporin use.    Outpatient Encounter Medications as of 11/26/2022  Medication Sig   acetaminophen (TYLENOL) 325 MG tablet Take 650 mg by mouth 2 (two) times daily. Give 2 tablets by mouth every 4 hours as needed.   albuterol (VENTOLIN HFA) 108 (90 Base) MCG/ACT inhaler Inhale 2 puffs into the lungs every 6 (six) hours as needed for wheezing or shortness of breath.   alum & mag hydroxide-simeth (MAALOX/MYLANTA) 200-200-20 MG/5 SUSP Give 30ml by mouth every 4 hours as needed.   apixaban (ELIQUIS) 5 MG TABS tablet Take 1 tablet (5 mg total) by mouth 2 (two) times daily.   benzonatate (TESSALON PERLES) 100 MG capsule Take 1 capsule (100 mg total) by mouth 3 (three) times daily as needed for cough.   bisacodyl (DULCOLAX) 10 MG suppository Place 10 mg rectally as needed for moderate constipation.   bismuth subsalicylate (PEPTO BISMOL) 262 MG/15ML suspension Give 10 cc by mouth as needed for loose stool supervised self-administration   carbamide peroxide (DEBROX) 6.5 % OTIC solution Place 5 drops into both ears as needed.   cetirizine (ZYRTEC) 10 MG tablet Take 10 mg by mouth daily as needed for allergies.   Cholecalciferol (VITAMIN D) 50 MCG (2000 UT) CAPS Give one capsule by mouth once daily .   Clobetasol Propionate 0.05 % shampoo Apply 1 Application topically every morning. And wash out   dextromethorphan-guaiFENesin (ROBITUSSIN-DM) 10-100 MG/5ML liquid Give 2 tsp by mouth every 4 hours as needed for coughing. supervised self-administration Notify MD if no relief in 3 days or continued use   dextrose (GLUTOSE) 40 % GEL Take 1 Tube by mouth as needed for low blood sugar.   fluocinolone (SYNALAR) 0.01 % external solution Apply 1 Application topically at bedtime. Apply to scalp   furosemide (LASIX) 20 MG tablet Take 20 mg by mouth every other day. Give one tablet by mouth as needed.   glipiZIDE (GLUCOTROL XL) 10 MG 24 hr tablet Take 10 mg by mouth 2 (two) times daily.   Infant Care  Products [employer] EX) Apply to buttocks/periarea topically every shift   ipratropium-albuterol (DUONEB) 0.5-2.5 (3) MG/3ML SOLN Take 3 mLs by nebulization every 4 (four) hours as needed.   linagliptin (TRADJENTA) 5 MG TABS tablet Take 1 tablet (5 mg total) by mouth daily.   lisinopril (ZESTRIL) 20 MG tablet TAKE 1 TABLET BY MOUTH DAILY   magnesium hydroxide (MILK OF MAGNESIA) 400 MG/5ML suspension Give 2 Tbsp by mouth as needed for constipation. supervised self-administration daily -- Call MD if no relief in 3 days of continued use   metFORMIN (GLUCOPHAGE-XR) 500 MG 24 hr tablet Take 1 tablet (500 mg total) by mouth 2 (two) times daily.   metoprolol succinate (TOPROL-XL) 25 MG 24 hr tablet TAKE 1 TABLET BY MOUTH ONCE DAILY   Multiple Vitamin (MULTIVITAMIN WITH MINERALS) TABS tablet Take 1 tablet by mouth daily.   nystatin (MYCOSTATIN/NYSTOP) powder Apply to excoriated area topically as needed Twice daily.  ondansetron (ZOFRAN) 4 MG tablet Take 4 mg by mouth every 8 (eight) hours as needed for nausea or vomiting.   OXYGEN 2lpm as needed for dyspnea/SOB   polyethylene glycol (MIRALAX / GLYCOLAX) 17 g packet Take 17 g by mouth daily as needed.   No facility-administered encounter medications on file as of 11/26/2022.    Review of Systems  Immunization History  Administered Date(s) Administered   Covid-19, Mrna,Vaccine(Spikevax)76yrs and older 06/17/2022   Fluad Quad(high Dose 65+) 12/20/2018   Influenza Split 01/01/2009, 01/02/2010, 01/13/2011, 12/22/2011   Influenza, High Dose Seasonal PF 01/18/2014, 01/01/2015, 11/30/2015, 12/01/2016, 01/11/2018, 12/08/2019   Influenza-Unspecified 01/08/2013, 11/08/2020, 12/25/2021   Moderna Covid-19 Vaccine Bivalent Booster 40yrs & up 11/30/2020, 08/06/2021, 01/17/2022   PFIZER Comirnaty(Gray Top)Covid-19 Tri-Sucrose Vaccine 07/27/2020   PFIZER(Purple Top)SARS-COV-2 Vaccination 04/01/2019, 04/22/2019, 12/05/2019, 07/27/2020   Pfizer Covid-19 Vaccine  Bivalent Booster 80yrs & up 11/30/2020   Pneumococcal Conjugate-13 10/19/2013   Pneumococcal Polysaccharide-23 01/02/2010   Td 11/02/2003, 01/13/2011   Tdap 01/13/2011, 02/28/2015   Zoster Recombinant(Shingrix) 08/11/2022, 11/25/2022   Zoster, Live 07/14/2007   Pertinent  Health Maintenance Due  Topic Date Due   OPHTHALMOLOGY EXAM  06/08/2021   INFLUENZA VACCINE  10/09/2022   HEMOGLOBIN A1C  05/20/2023   FOOT EXAM  08/06/2023   DEXA SCAN  Completed      07/24/2021    4:09 PM 07/24/2021   10:50 PM 07/25/2021   10:02 AM 07/26/2021    1:00 AM 09/18/2022    9:32 AM  Fall Risk  Falls in the past year?     0  (RETIRED) Patient Fall Risk Level Moderate fall risk High fall risk High fall risk High fall risk    Functional Status Survey:    Vitals:   11/26/22 1306  BP: 120/72  Pulse: 70  Resp: 18  Temp: 98 F (36.7 C)  SpO2: 96%  Weight: 173 lb (78.5 kg)  Height: 5\' 1"  (1.549 m)   Body mass index is 32.69 kg/m. Physical Exam Constitutional:      Appearance: Normal appearance.  Cardiovascular:     Rate and Rhythm: Normal rate and regular rhythm.  Pulmonary:     Effort: Pulmonary effort is normal.  Neurological:     Mental Status: She is alert and oriented to person, place, and time.     Labs reviewed: Recent Labs    05/10/22 0104 05/11/22 0313 05/12/22 0533 05/13/22 0704 05/14/22 0351 07/24/22 0000 08/07/22 0000 08/14/22 0000 11/20/22 0000  NA 136   < > 137 134* 137   < > 140 139 141  K 3.4*   < > 3.6 3.7 3.7   < > 4.6 4.5 4.5  CL 103   < > 104 105 104   < > 105 105 104  CO2 24   < > 23 23 24    < > 30* 28* 30*  GLUCOSE 246*   < > 205* 203* 231*  --   --   --   --   BUN 24*   < > 13 14 19    < > 31* 28* 32*  CREATININE 0.79   < > 0.66 0.72 0.78   < > 1.0 0.9 1.0  CALCIUM 8.7*   < > 8.5* 8.5* 8.6*   < > 9.4 9.4 9.4  MG 1.6*  --   --   --   --   --   --   --   --    < > = values in  this interval not displayed.   Recent Labs    05/12/22 0533 05/13/22 0704  05/14/22 0351 08/07/22 0000 11/20/22 0000  AST 162* 67* 40 13 13  ALT 308* 219* 169* 15 17  ALKPHOS 122 142* 136* 44 62  BILITOT 1.2 0.9 0.6  --   --   PROT 5.4* 5.5* 5.7*  --   --   ALBUMIN 2.5* 2.6* 2.6* 3.9 4.0   Recent Labs    05/12/22 0533 05/13/22 0704 05/14/22 0351 08/07/22 0000  WBC 6.1 8.3 8.3 6.6  NEUTROABS 3.0 3.7 3.1 2,191.00  HGB 11.4* 11.8* 11.5* 12.8  HCT 34.9* 35.9* 34.8* 38  MCV 89.0 89.8 88.3  --   PLT 205 223 241 267   Lab Results  Component Value Date   TSH 2.284 04/17/2022   Lab Results  Component Value Date   HGBA1C 7.5 11/20/2022   Lab Results  Component Value Date   CHOL 241 (A) 11/20/2022   HDL 43 11/20/2022   LDLCALC 165 11/20/2022   TRIG 179 (A) 11/20/2022   CHOLHDL 5.2 05/10/2022    Significant Diagnostic Results in last 30 days:  No results found.  Assessment/Plan Diarrhea, unspecified type  Hyperlipidemia associated with type 2 diabetes mellitus (HCC) Most recent A1c 7.5. discussed importance of avoidance of substances that could impact her stool patters such as coffee and ice cream. Discussed symptom management with stool bulkers vs dietary changes. Decrease dose of metformin to 500 mg BID. Plan to trial zetia once there is resolution of diarrhea.   Family/ staff Communication: nursing  Labs/tests ordered:  none

## 2023-02-10 ENCOUNTER — Encounter: Payer: Self-pay | Admitting: Nurse Practitioner

## 2023-02-10 ENCOUNTER — Non-Acute Institutional Stay (SKILLED_NURSING_FACILITY): Payer: Medicare Other | Admitting: Nurse Practitioner

## 2023-02-10 DIAGNOSIS — D6869 Other thrombophilia: Secondary | ICD-10-CM

## 2023-02-10 DIAGNOSIS — E1159 Type 2 diabetes mellitus with other circulatory complications: Secondary | ICD-10-CM | POA: Diagnosis not present

## 2023-02-10 DIAGNOSIS — I10 Essential (primary) hypertension: Secondary | ICD-10-CM | POA: Diagnosis not present

## 2023-02-10 DIAGNOSIS — I480 Paroxysmal atrial fibrillation: Secondary | ICD-10-CM | POA: Diagnosis not present

## 2023-02-10 DIAGNOSIS — J432 Centrilobular emphysema: Secondary | ICD-10-CM

## 2023-02-10 DIAGNOSIS — E785 Hyperlipidemia, unspecified: Secondary | ICD-10-CM

## 2023-02-10 MED ORDER — EZETIMIBE 10 MG PO TABS
10.0000 mg | ORAL_TABLET | Freq: Every day | ORAL | 1 refills | Status: AC
Start: 2023-02-10 — End: ?

## 2023-02-10 MED ORDER — EMPAGLIFLOZIN 10 MG PO TABS
10.0000 mg | ORAL_TABLET | Freq: Every day | ORAL | 1 refills | Status: AC
Start: 2023-02-10 — End: ?

## 2023-02-10 NOTE — Progress Notes (Signed)
Location:  Other [facility]) Nursing Home Room Number: 302 L Place of Service:  [phone])  [name], MD  Patient Care Team: [name], MD as PCP - General (Family Medicine) End, Cristal Deer, MD as PCP - Cardiology (Cardiology) Lockie Mola, MD as Referring Physician (Ophthalmology) Sharon Seller, NP as Nurse Practitioner (Geriatric Medicine) [facility], [facility])  Extended Emergency Contact Information Primary Emergency Contact: [name] Address: [address]          [place], [place] [address] [name] of [place] Home Phone: [phone] Work Phone: [phone] Relation: Daughter  Goals of care: Advanced Directive information    11/26/2022    1:15 PM  Advanced Directives  Does Patient Have a Medical Advance Directive? Yes  Type of Advance Directive Out of facility DNR (pink MOST or yellow form)  Does patient want to make changes to medical advance directive? No - Patient declined     Chief Complaint  Patient presents with   Medical Management of Chronic Issues    Routine visit. Discuss need for eye exam.     HPI:  Pt is a 89 y.o. female seen today for medical management of chronic disease.  She reports she is doing well.  Cholesterol elevated on last labs- she is in agreement to start medication to help reduce risk. Hx of MI.  No chest pains or shortness of breath.  No LE edema.  No palpitations.  DM- has cut back on chocolate ice cream- was causing her diarrhea.  Bowels moving good  Sleeping well at night.  No anxiety or depression.   No pain.      Past Medical History:  Diagnosis Date   Acute pancreatitis 07/24/2021   Basal cell carcinoma 03/30/2008   left upper arm/excision   Basal cell carcinoma 03/31/2008   left upper arm, right lower leg   Basal cell carcinoma 01/18/2009   left upper arm   Collagenous colitis    Diabetes mellitus without complication (HCC)    Diverticulitis    Elevated  LFTs 07/25/2021   Heart murmur    at birth, none since   History of echocardiogram    a. 11/2015: echo showing EF of 60-65% with no WMA. Grade 1 DD and mild MR noted.    Hyperlipidemia    Per [facility]   Hypertension    Intertrochanteric fracture of right femur (HCC) 06/11/2019   Lack of coordination    Per [facility] term current use of anticoagulant    Per [facility]   Noninfective gastroenteritis and colitis, unspecified    Per [facility]   PAF (paroxysmal atrial fibrillation) (HCC)    a. initially diagnosed in 08/2016 --> started on Eliquis   Squamous cell carcinoma of skin 05/16/2020   Left nasal tip anterior to ala, refer to [facility]   Syncope    a. initially occurring in Fall 2016 b. Hospitalized in 10/2015 and 11/2015 for recurrent episodes.    Past Surgical History:  Procedure Laterality Date   ABDOMINAL HYSTERECTOMY     BACK SURGERY  08/08/2008   Lumbar discectomy   COLONOSCOPY WITH PROPOFOL     EXCISION/RELEASE BURSA HIP Left 05/03/2019   Procedure: HIP ABDUCTOR REPAIR;  Surgeon: Kennedy Bucker, MD;  Location: [facility] ORS;  Service: Orthopedics;  Laterality: Left;   INTRAMEDULLARY (IM) NAIL INTERTROCHANTERIC Right 06/13/2019   Procedure: INTRAMEDULLARY (IM) NAIL INTERTROCHANTRIC;  Surgeon: Kennedy Bucker, MD;  Location: [facility] ORS;  Service: Orthopedics;  Laterality: Right;  NECK SURGERY  1980    Allergies  Allergen Reactions   Morphine Sulfate Other (See Comments)    BP bottoms out   Penicillins Diarrhea    Has patient had a PCN reaction causing immediate rash, facial/tongue/throat swelling, SOB or lightheadedness with hypotension: Yes Has patient had a PCN reaction causing severe rash involving mucus membranes or skin necrosis: Yes Has patient had a PCN reaction that required hospitalization No Has patient had a PCN reaction occurring within the last 10 years: No If all of the above answers are "NO", then may proceed with Cephalosporin use.    Outpatient  Encounter Medications as of 02/10/2023  Medication Sig   acetaminophen (TYLENOL) 325 MG tablet Take 650 mg by mouth 2 (two) times daily. Give 2 tablets by mouth every 4 hours as needed.   albuterol (VENTOLIN HFA) 108 (90 Base) MCG/ACT inhaler Inhale 2 puffs into the lungs every 6 (six) hours as needed for wheezing or shortness of breath.   alum & mag hydroxide-simeth (MAALOX/MYLANTA) 200-200-20 MG/5 SUSP Give 30ml by mouth every 4 hours as needed.   apixaban (ELIQUIS) 5 MG TABS tablet Take 1 tablet (5 mg total) by mouth 2 (two) times daily.   benzonatate (TESSALON PERLES) 100 MG capsule Take 1 capsule (100 mg total) by mouth 3 (three) times daily as needed for cough.   bisacodyl (DULCOLAX) 10 MG suppository Place 10 mg rectally as needed for moderate constipation.   bismuth subsalicylate (PEPTO BISMOL) 262 MG/15ML suspension Give 10 cc by mouth as needed for loose stool supervised self-administration   carbamide peroxide (DEBROX) 6.5 % OTIC solution Place 5 drops into both ears as needed.   cetirizine (ZYRTEC) 10 MG tablet Take 10 mg by mouth daily as needed for allergies.   Cholecalciferol (VITAMIN D) 50 MCG (2000 UT) CAPS Give one capsule by mouth once daily .   Clobetasol Propionate 0.05 % shampoo Apply 1 Application topically every morning. And wash out   dextromethorphan-guaiFENesin (ROBITUSSIN-DM) 10-100 MG/5ML liquid Give 2 tsp by mouth every 4 hours as needed for coughing. supervised self-administration Notify MD if no relief in 3 days or continued use   dextrose (GLUTOSE) 40 % GEL Take 1 Tube by mouth as needed for low blood sugar.   empagliflozin (JARDIANCE) 10 MG TABS tablet Take 1 tablet (10 mg total) by mouth daily before breakfast.   Ensure (ENSURE) Take 237 mLs by mouth as needed (For weight loss or decreased intake).   ezetimibe (ZETIA) 10 MG tablet Take 1 tablet (10 mg total) by mouth daily.   fluocinolone (SYNALAR) 0.01 % external solution Apply 1 Application topically at  bedtime. Apply to scalp   furosemide (LASIX) 20 MG tablet Take 20 mg by mouth every other day. Give one tablet by mouth as needed.   glipiZIDE (GLUCOTROL XL) 10 MG 24 hr tablet Take 10 mg by mouth 2 (two) times daily.   Infant Care Products [facility] EX) Apply to buttocks/periarea topically every shift   ipratropium-albuterol (DUONEB) 0.5-2.5 (3) MG/3ML SOLN Take 3 mLs by nebulization every 4 (four) hours as needed.   linagliptin (TRADJENTA) 5 MG TABS tablet Take 1 tablet (5 mg total) by mouth daily.   lisinopril (ZESTRIL) 20 MG tablet TAKE 1 TABLET BY MOUTH DAILY   magnesium hydroxide (MILK OF MAGNESIA) 400 MG/5ML suspension Give 2 Tbsp by mouth as needed for constipation. supervised self-administration daily -- Call MD if no relief in 3 days of continued use   metFORMIN (GLUCOPHAGE-XR) 500 MG 24  hr tablet Take 1 tablet (500 mg total) by mouth 2 (two) times daily.   metoprolol succinate (TOPROL-XL) 25 MG 24 hr tablet TAKE 1 TABLET BY MOUTH ONCE DAILY   nystatin (MYCOSTATIN/NYSTOP) powder Apply to excoriated area topically as needed Twice daily.   ondansetron (ZOFRAN) 4 MG tablet Take 4 mg by mouth every 8 (eight) hours as needed for nausea or vomiting.   polyethylene glycol (MIRALAX / GLYCOLAX) 17 g packet Take 17 g by mouth daily as needed.   Multiple Vitamin (MULTIVITAMIN WITH MINERALS) TABS tablet Take 1 tablet by mouth daily.   OXYGEN 2lpm as needed for dyspnea/SOB   [DISCONTINUED] metFORMIN (GLUCOPHAGE) 1000 MG tablet Take 1,000 mg by mouth 2 (two) times daily with a meal. Confirmed 2 different doses with Abbey Chatters, NP (Patient not taking: Reported on 02/10/2023)   No facility-administered encounter medications on file as of 02/10/2023.    Review of Systems  Constitutional:  Negative for activity change, appetite change, fatigue and unexpected weight change.  HENT:  Negative for congestion and hearing loss.   Eyes: Negative.   Respiratory:  Negative for cough and shortness of  breath.   Cardiovascular:  Negative for chest pain, palpitations and leg swelling.  Gastrointestinal:  Negative for abdominal pain, constipation and diarrhea.  Genitourinary:  Negative for difficulty urinating and dysuria.  Musculoskeletal:  Negative for arthralgias and myalgias.  Skin:  Negative for color change and wound.  Neurological:  Negative for dizziness and weakness.  Psychiatric/Behavioral:  Negative for agitation, behavioral problems and confusion.      Immunization History  Administered Date(s) Administered   Fluad Quad(high Dose 65+) 12/20/2018   Influenza Split 01/01/2009, 01/02/2010, 01/13/2011, 12/22/2011   Influenza, High Dose Seasonal PF 01/18/2014, 01/01/2015, 11/30/2015, 12/01/2016, 01/11/2018, 12/08/2019, 12/26/2022   Influenza-Unspecified 01/08/2013, 11/08/2020, 12/25/2021   Moderna Covid-19 Fall Seasonal Vaccine 47yrs & older 06/17/2022, 12/05/2022   Moderna Covid-19 Vaccine Bivalent Booster 8yrs & up 11/30/2020, 08/06/2021, 01/17/2022   PFIZER Comirnaty(Gray Top)Covid-19 Tri-Sucrose Vaccine 07/27/2020   PFIZER(Purple Top)SARS-COV-2 Vaccination 04/01/2019, 04/22/2019, 12/05/2019, 07/27/2020   Pfizer Covid-19 Vaccine Bivalent Booster 85yrs & up 11/30/2020   Pneumococcal Conjugate-13 10/19/2013   Pneumococcal Polysaccharide-23 01/02/2010   Td 11/02/2003, 01/13/2011   Tdap 01/13/2011, 02/28/2015   Zoster Recombinant(Shingrix) 08/11/2022, 11/25/2022   Zoster, Live 07/14/2007   Pertinent  Health Maintenance Due  Topic Date Due   OPHTHALMOLOGY EXAM  06/08/2021   HEMOGLOBIN A1C  05/20/2023   FOOT EXAM  08/06/2023   INFLUENZA VACCINE  Completed   DEXA SCAN  Completed      07/24/2021    4:09 PM 07/24/2021   10:50 PM 07/25/2021   10:02 AM 07/26/2021    1:00 AM 09/18/2022    9:32 AM  Fall Risk  Falls in the past year?     0  (RETIRED) Patient Fall Risk Level Moderate fall risk High fall risk High fall risk High fall risk    Functional Status Survey:     Vitals:   02/10/23 1519  BP: 127/70  Pulse: 76  Weight: 173 lb 3.2 oz (78.6 kg)  Height: 5\' 1"  (1.549 m)   Body mass index is 32.73 kg/m. Physical Exam Constitutional:      General: She is not in acute distress.    Appearance: She is well-developed. She is not diaphoretic.  HENT:     Head: Normocephalic and atraumatic.     Mouth/Throat:     Pharynx: No oropharyngeal exudate.  Eyes:     Conjunctiva/sclera: Conjunctivae  normal.     Pupils: Pupils are equal, round, and reactive to light.  Cardiovascular:     Rate and Rhythm: Normal rate and regular rhythm.     Heart sounds: Normal heart sounds.  Pulmonary:     Effort: Pulmonary effort is normal.     Breath sounds: Normal breath sounds.  Abdominal:     General: Bowel sounds are normal.     Palpations: Abdomen is soft.  Musculoskeletal:     Cervical back: Normal range of motion and neck supple.     Right lower leg: No edema.     Left lower leg: No edema.  Skin:    General: Skin is warm and dry.  Neurological:     Mental Status: She is alert.  Psychiatric:        Mood and Affect: Mood normal.     Labs reviewed: Recent Labs    05/10/22 0104 05/11/22 0313 05/12/22 0533 05/13/22 0704 05/14/22 0351 07/24/22 0000 08/07/22 0000 08/14/22 0000 11/20/22 0000  NA 136   < > 137 134* 137   < > 140 139 141  K 3.4*   < > 3.6 3.7 3.7   < > 4.6 4.5 4.5  CL 103   < > 104 105 104   < > 105 105 104  CO2 24   < > 23 23 24    < > 30* 28* 30*  GLUCOSE 246*   < > 205* 203* 231*  --   --   --   --   BUN 24*   < > 13 14 19    < > 31* 28* 32*  CREATININE 0.79   < > 0.66 0.72 0.78   < > 1.0 0.9 1.0  CALCIUM 8.7*   < > 8.5* 8.5* 8.6*   < > 9.4 9.4 9.4  MG 1.6*  --   --   --   --   --   --   --   --    < > = values in this interval not displayed.   Recent Labs    05/12/22 0533 05/13/22 0704 05/14/22 0351 08/07/22 0000 11/20/22 0000  AST 162* 67* 40 13 13  ALT 308* 219* 169* 15 17  ALKPHOS 122 142* 136* 44 62  BILITOT 1.2 0.9  0.6  --   --   PROT 5.4* 5.5* 5.7*  --   --   ALBUMIN 2.5* 2.6* 2.6* 3.9 4.0   Recent Labs    05/12/22 0533 05/13/22 0704 05/14/22 0351 08/07/22 0000  WBC 6.1 8.3 8.3 6.6  NEUTROABS 3.0 3.7 3.1 2,191.00  HGB 11.4* 11.8* 11.5* 12.8  HCT 34.9* 35.9* 34.8* 38  MCV 89.0 89.8 88.3  --   PLT 205 223 241 267   Lab Results  Component Value Date   TSH 2.284 04/17/2022   Lab Results  Component Value Date   HGBA1C 7.5 11/20/2022   Lab Results  Component Value Date   CHOL 241 (A) 11/20/2022   HDL 43 11/20/2022   LDLCALC 165 11/20/2022   TRIG 179 (A) 11/20/2022   CHOLHDL 5.2 05/10/2022    Significant Diagnostic Results in last 30 days:  No results found.  Assessment/Plan 1. Paroxysmal atrial fibrillation (HCC) -rate controlled on metoprolol   2. Acquired thrombophilia (HCC) Due to a fib, continues on eliquis, follow up cbc  3. Type 2 diabetes mellitus with other circulatory complication, without long-term current use of insulin (HCC) -Encouraged dietary compliance, routine foot care/monitoring and  to keep up with diabetic eye exams through ophthalmology  Her fasting blood sugars are NOT at goal in the 200s She does not want to be on insulin - empagliflozin (JARDIANCE) 10 MG TABS tablet; Take 1 tablet (10 mg total) by mouth daily before breakfast.  Dispense: 90 tablet; Refill: 1 -follow up A1c in 3 months  4. Essential hypertension -Blood pressure well controlled, goal bp <140/90 Continue current medications and dietary modifications follow metabolic panel  5. Dyslipidemia Not at goal, she had discussed starting zetia with Dr Sydnee Cabal and is agreeable.  - ezetimibe (ZETIA) 10 MG tablet; Take 1 tablet (10 mg total) by mouth daily.  Dispense: 90 tablet; Refill: 1 Follow up lipids   6. Centrilobular emphysema (HCC) -well controlled, not currently on medication    [name] K. Biagio Borg [facility] [phone]

## 2023-02-19 NOTE — Progress Notes (Unsigned)
Cardiology Office Note    Date:  02/19/2023   ID:  [name], DOB 09-27-1931, MRN [id number]  PCP:  [name], MD  Cardiologist:  Yvonne Kendall, MD  Electrophysiologist:  None   Chief Complaint: Follow up  History of Present Illness:   [name] is a 89 y.o. female with history of HFpEF, PAF diagnosed in 2018, elevated troponin felt to be type II MI (supply/demand mismatch ischemia) in 3/24, recurrent syncope felt to be vasovagal in etiology, DM2, HTN, and HLD who presents for follow-up of HFpEF.    She was admitted to the hospital in 05/2022 after having been found on the ground next to her bed.  This was preceded by at least 1 day of dizziness, nausea, abdominal pain following a prior admission a few weeks earlier for RSV and COPD exacerbation.  She denied chest pain.  High-sensitivity troponin peaked at 964.  Presentation was not felt to be consistent with ACS.  Echo showed normal LVEF without regional wall motion abnormalities.  Following discussion with patient and her daughter, no invasive procedures were pursued.  She was seen in hospital follow-up on 06/27/2022 and was feeling back to her baseline.  She had not had any further falls.  She was without symptoms of angina or cardiac decompensation.  She did feel somewhat bloated and noted some mild intermittent lower extremity swelling and wheezing following return to assisted living.  It was recommended she take furosemide 20 mg daily rather than as needed.  She was seen in the office in 07/2022 and did feel like daily furosemide improved her lower extremity swelling some.  She was not drinking a significant amount of fluid and also had volume loss through diarrhea.  Given this, furosemide was reduced to 20 mg every other day.  She was last seen in the office on 10/21/2022 and felt like every other day dose of furosemide was maintaining euvolemia.  She did continue to note diarrhea since her hospitalization, and in this  setting she underwent a trial of holding atorvastatin.  ***   Labs independently reviewed: 11/2022 - A1c 7.5, albumin 4.0, AST/ALT normal, TC 241, TG 179, HDL 43, LDL 165, BUN 32, serum creatinine 1.0, potassium 4.5 07/2022 - Hgb 12.8, PLT 267 04/2022 - TSH normal   Past Medical History:  Diagnosis Date   Acute pancreatitis 07/24/2021   Basal cell carcinoma 03/30/2008   left upper arm/excision   Basal cell carcinoma 03/31/2008   left upper arm, right lower leg   Basal cell carcinoma 01/18/2009   left upper arm   Collagenous colitis    Diabetes mellitus without complication (HCC)    Diverticulitis    Elevated LFTs 07/25/2021   Heart murmur    at birth, none since   History of echocardiogram    a. 11/2015: echo showing EF of 60-65% with no WMA. Grade 1 DD and mild MR noted.    Hyperlipidemia    Per [facility]   Hypertension    Intertrochanteric fracture of right femur (HCC) 06/11/2019   Lack of coordination    Per [facility] term current use of anticoagulant    Per [facility]   Noninfective gastroenteritis and colitis, unspecified    Per [facility]   PAF (paroxysmal atrial fibrillation) (HCC)    a. initially diagnosed in 08/2016 --> started on Eliquis   Squamous cell carcinoma of skin 05/16/2020   Left nasal tip anterior to ala, refer to [facility]  Syncope    a. initially occurring in Fall 2016 b. Hospitalized in 10/2015 and 11/2015 for recurrent episodes.     Past Surgical History:  Procedure Laterality Date   ABDOMINAL HYSTERECTOMY     BACK SURGERY  08/08/2008   Lumbar discectomy   COLONOSCOPY WITH PROPOFOL     EXCISION/RELEASE BURSA HIP Left 05/03/2019   Procedure: HIP ABDUCTOR REPAIR;  Surgeon: Kennedy Bucker, MD;  Location: [facility] ORS;  Service: Orthopedics;  Laterality: Left;   INTRAMEDULLARY (IM) NAIL INTERTROCHANTERIC Right 06/13/2019   Procedure: INTRAMEDULLARY (IM) NAIL INTERTROCHANTRIC;  Surgeon: Kennedy Bucker, MD;  Location: [facility] ORS;  Service: Orthopedics;   Laterality: Right;   NECK SURGERY  1980    Current Medications: No outpatient medications have been marked as taking for the 02/20/23 encounter (Appointment) with Sondra Barges, PA-C.    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  Tobacco Use   Smoking status: Former    Current packs/day: 0.00    Types: Cigarettes    Quit date: 03/09/1978    Years since quitting: 44.9   Smokeless tobacco: Never  Vaping Use   Vaping status: Never Used  Substance and Sexual Activity   Alcohol use: Not Currently    Comment: once a month--mixed drink   Drug use: No   Sexual activity: Not on file  Other Topics Concern   Not on file  Social History Narrative   2 daughters and a son (1 local plus [place] and [place])      Has living will    Daughter Eunice Blase is [facility] POA   Has DNR   No feeding tube   Social Drivers of Health   Financial Resource Strain: Low Risk  (04/25/2020)   Overall Financial Resource Strain (CARDIA)    Difficulty of Paying Living Expenses: Not hard at all  Food Insecurity: No Food Insecurity (04/25/2020)   Hunger Vital Sign    Worried About Running Out of Food in the Last Year: Never true    Ran Out of Food in the Last Year: Never true  Transportation Needs: No Transportation Needs (05/11/2022)   PRAPARE - Administrator, Civil Service (Medical): No    Lack of Transportation (Non-Medical): No  Physical Activity: Inactive (04/25/2020)   Exercise Vital Sign    Days of Exercise per Week: 0 days    Minutes of Exercise per Session: 0 min  Stress: No Stress Concern Present (04/25/2020)   [employer] of Occupational Health - Occupational Stress Questionnaire    Feeling of Stress  : Not at all  Social Connections: Moderately Isolated (04/25/2020)   Social Connection and Isolation Panel [NHANES]    Frequency of Communication with Friends and Family: More than three times a week    Frequency of Social Gatherings with Friends and Family: More than three times a week    Attends Religious Services: More than 4 times per year    Active Member of [employer] or Organizations: No    Attends Banker Meetings: Never    Marital Status: Widowed     Family History:  The patient's family history includes Diabetes in her mother; Hypertension in her mother; Lung cancer  in her father.  ROS:   12-point review of systems is negative unless otherwise noted in the HPI.   EKGs/Labs/Other Studies Reviewed:    Studies reviewed were summarized above. The additional studies were reviewed today:  2D echo 05/10/2022: 1. Left ventricular ejection fraction, by estimation, is 55 to 60%. The  left ventricle has normal function. The left ventricle has no regional  wall motion abnormalities. There is mild left ventricular hypertrophy.  Left ventricular diastolic parameters  are indeterminate.   2. Right ventricular systolic function is normal. The right ventricular  size is normal.   3. Right atrial size was mildly dilated.   4. The mitral valve is normal in structure. No evidence of mitral valve  regurgitation.   5. The aortic valve was not well visualized. Aortic valve regurgitation  is not visualized. No aortic stenosis is present.   6. The inferior vena cava is normal in size with greater than 50%  respiratory variability, suggesting right atrial pressure of 3 mmHg.  __________   2D echo 12/18/2020: 1. Left ventricular ejection fraction, by estimation, is 55 to 60%. The  left ventricle has normal function. The left ventricle has no regional  wall motion abnormalities. Left ventricular diastolic parameters are  consistent with Grade I diastolic  dysfunction (impaired  relaxation).   2. Right ventricular systolic function is normal. The right ventricular  size is normal.   3. The mitral valve is normal in structure. No evidence of mitral valve  regurgitation.   4. The aortic valve was not well visualized. Aortic valve regurgitation  is not visualized.   5. The inferior vena cava is normal in size with greater than 50%  respiratory variability, suggesting right atrial pressure of 3 mmHg.   Comparison(s): LVEF 55-60%.  __________   Eugenie Birks MPI 10/26/2019: Pharmacological myocardial perfusion imaging study with no significant  ischemia Normal wall motion, EF estimated at 61% No EKG changes concerning for ischemia at peak stress or in recovery. CT attenuation correction images with mild coronary calcification and aortic atherosclerosis Low risk scan ___________   Limited echo 06/14/2019: 1. Left ventricular ejection fraction, by estimation, is 55 to 60%. The  left ventricle has normal function. The left ventricle demonstrates  regional wall motion abnormalities (see scoring diagram/findings for  description). There is mild left ventricular   hypertrophy. Left ventricular diastolic parameters are consistent with  Grade I diastolic dysfunction (impaired relaxation). There is mild  hypokinesis of the left ventricular, basal-mid inferior wall and  inferoseptal wall.   2. There is moderately elevated pulmonary artery systolic pressure.  __________   2D echo 04/21/2019: 1. Left ventricular ejection fraction, by estimation, is 55 to 60%. The  left ventricle has normal function. The left ventricle has no regional  wall motion abnormalities. Left ventricular diastolic parameters are  consistent with Grade I diastolic  dysfunction (impaired relaxation).   2. Right ventricular systolic function is normal. The right ventricular  size is normal.  __________   Eugenie Birks MPI 01/08/2016: No significant ischemia. Small apical anterior and apical defect is  present, more pronounced on ther rest images. This most likely represents shifting breast attenuation but cannot rule out small area of scar. The left ventricular ejection fraction is normal (55-65%). Study is degraded by extracardiac activity and motion. This is a low risk study. __________   Event monitor 11/2015: Monitoring period was 30 days. The predominant rhythm was sinus with an average heart rate of 68 bpm (range 45 -134 bpm). Occasional PAC's  and PVC's were noted. Two episodes of supraventricular tachycardia were identified, lasting up to 14 beats with a maximum rate of 136 bpm. No sustained arrhythmias were identified. Reported symptoms of "tired or fatigued" corresponded to sinus rhythm and sinus rhythm with PAC's __________   Carotid artery ultrasound 11/22/2015: IMPRESSION: No hemodynamically significant stenosis or plaque is noted in either cervical carotid artery. __________   2D echo 11/21/2015: - Left ventricle: The cavity size was normal. Systolic function was    normal. The estimated ejection fraction was in the range of 60%    to 65%. Wall motion was normal; there were no regional wall    motion abnormalities. Doppler parameters are consistent with    abnormal left ventricular relaxation (grade 1 diastolic    dysfunction).  - Mitral valve: There was mild regurgitation.  - Left atrium: The atrium was normal in size.  - Right ventricle: Systolic function was normal.  - Pulmonary arteries: Systolic pressure was within the normal    range.   Impressions:   - Normal study.   EKG:  EKG is ordered today.  The EKG ordered today demonstrates ***  Recent Labs: 04/17/2022: TSH 2.284 05/10/2022: Magnesium 1.6 08/07/2022: Hemoglobin 12.8; Platelets 267 11/20/2022: ALT 17; BUN 32; Creatinine 1.0; Potassium 4.5; Sodium 141  Recent Lipid Panel    Component Value Date/Time   CHOL 241 (A) 11/20/2022 0000   CHOL 258 (H) 04/29/2021 1300   TRIG 179 (A) 11/20/2022 0000   HDL  43 11/20/2022 0000   HDL 38 (L) 04/29/2021 1300   CHOLHDL 5.2 05/10/2022 0836   VLDL 42 (H) 05/10/2022 0836   LDLCALC 165 11/20/2022 0000   LDLCALC 146 (H) 04/29/2021 1300    PHYSICAL EXAM:    VS:  There were no vitals taken for this visit.  BMI: There is no height or weight on file to calculate BMI.  Physical Exam  Wt Readings from Last 3 Encounters:  02/10/23 173 lb 3.2 oz (78.6 kg)  11/26/22 173 lb (78.5 kg)  11/17/22 173 lb (78.5 kg)     ASSESSMENT & PLAN:   HFpEF:  Elevated troponin/HLD:  PAF: ***.  CHA2DS2-VASc at least 6 (CHF, HTN, age x 2, DM2, sex category).   HTN: Blood pressure   {Are you ordering a CV Procedure (e.g. stress test, cath, DCCV, TEE, etc)?   Press F2        :[id number]}     Disposition: F/u with Dr. Okey Dupre or an APP in ***.   Medication Adjustments/Labs and Tests Ordered: Current medicines are reviewed at length with the patient today.  Concerns regarding medicines are outlined above. Medication changes, Labs and Tests ordered today are summarized above and listed in the Patient Instructions accessible in Encounters.   Signed, Eula Listen, PA-C 02/19/2023 12:34 PM     [facility] [place] [address] [place], [place] [address] [phone]

## 2023-02-20 ENCOUNTER — Ambulatory Visit: Payer: Medicare Other | Attending: Physician Assistant | Admitting: Physician Assistant

## 2023-02-20 DIAGNOSIS — E785 Hyperlipidemia, unspecified: Secondary | ICD-10-CM

## 2023-02-20 DIAGNOSIS — I10 Essential (primary) hypertension: Secondary | ICD-10-CM

## 2023-02-20 DIAGNOSIS — I480 Paroxysmal atrial fibrillation: Secondary | ICD-10-CM

## 2023-02-20 DIAGNOSIS — I5032 Chronic diastolic (congestive) heart failure: Secondary | ICD-10-CM

## 2023-02-20 DIAGNOSIS — R7989 Other specified abnormal findings of blood chemistry: Secondary | ICD-10-CM

## 2023-03-30 ENCOUNTER — Emergency Department
Admission: EM | Admit: 2023-03-30 | Discharge: 2023-03-30 | Disposition: A | Discharge status: Skilled Nursing Facility | Payer: Medicare Other | Attending: Emergency Medicine | Admitting: Emergency Medicine

## 2023-03-30 ENCOUNTER — Emergency Department: Payer: Medicare Other

## 2023-03-30 ENCOUNTER — Other Ambulatory Visit: Payer: Self-pay

## 2023-03-30 DIAGNOSIS — I2510 Atherosclerotic heart disease of native coronary artery without angina pectoris: Secondary | ICD-10-CM | POA: Insufficient documentation

## 2023-03-30 DIAGNOSIS — I509 Heart failure, unspecified: Secondary | ICD-10-CM | POA: Diagnosis not present

## 2023-03-30 DIAGNOSIS — J449 Chronic obstructive pulmonary disease, unspecified: Secondary | ICD-10-CM | POA: Diagnosis not present

## 2023-03-30 DIAGNOSIS — Z7901 Long term (current) use of anticoagulants: Secondary | ICD-10-CM | POA: Insufficient documentation

## 2023-03-30 DIAGNOSIS — W01198A Fall on same level from slipping, tripping and stumbling with subsequent striking against other object, initial encounter: Secondary | ICD-10-CM | POA: Insufficient documentation

## 2023-03-30 DIAGNOSIS — S40011A Contusion of right shoulder, initial encounter: Secondary | ICD-10-CM | POA: Insufficient documentation

## 2023-03-30 DIAGNOSIS — I110 Hypertensive heart disease with heart failure: Secondary | ICD-10-CM | POA: Insufficient documentation

## 2023-03-30 DIAGNOSIS — S4991XA Unspecified injury of right shoulder and upper arm, initial encounter: Secondary | ICD-10-CM | POA: Diagnosis present

## 2023-03-30 DIAGNOSIS — E119 Type 2 diabetes mellitus without complications: Secondary | ICD-10-CM | POA: Diagnosis not present

## 2023-03-30 DIAGNOSIS — W19XXXA Unspecified fall, initial encounter: Secondary | ICD-10-CM

## 2023-03-30 MED ORDER — OXYCODONE-ACETAMINOPHEN 5-325 MG PO TABS
1.0000 | ORAL_TABLET | Freq: Once | ORAL | Status: AC
  Administered 2023-03-30: 1 via ORAL
  Filled 2023-03-30: qty 1

## 2023-03-30 MED ORDER — LIDOCAINE 5 % EX PTCH: 1.0000 | MEDICATED_PATCH | Freq: Two times a day (BID) | CUTANEOUS | 0 refills | Status: DC

## 2023-03-30 NOTE — ED Notes (Signed)
Sling applied at this time. Care hand off given to facility. ED secretary arranging transportation back to facility. Dispo completed at this time.

## 2023-03-30 NOTE — ED Provider Notes (Signed)
[facility] Provider Note    Event Date/Time   First MD Initiated Contact with Patient 03/30/23 [phone]     (approximate)   History   Chief Complaint Fall   HPI  [name] is a 89 y.o. female with past medical history of hypertension, hyperlipidemia, diabetes, atrial fibrillation on Eliquis, COPD, CHF, and CAD who presents to the ED complaining of shoulder pain.  Patient reports that just prior to arrival she lost her balance and fell, striking her right shoulder.  She denies hitting her head or losing consciousness, complains only of pain when trying to lift up her right arm.  She denies any pain to her chest, abdomen, left arm, or lower extremities.     Physical Exam   Triage Vital Signs: ED Triage Vitals  Encounter Vitals Group     BP 03/30/23 0102 (!) 142/65     Systolic BP Percentile --      Diastolic BP Percentile --      Pulse Rate 03/30/23 0102 65     Resp 03/30/23 0102 18     Temp 03/30/23 0102 97.9 F (36.6 C)     Temp Source 03/30/23 0102 Oral     SpO2 03/30/23 0102 98 %     Weight 03/30/23 0101 150 lb (68 kg)     Height 03/30/23 0101 5\' 3"  (1.6 m)     Head Circumference --      Peak Flow --      Pain Score 03/30/23 0101 4     Pain Loc --      Pain Education --      Exclude from Growth Chart --     Most recent vital signs: Vitals:   03/30/23 0102 03/30/23 0206  BP: (!) 142/65 (!) 149/63  Pulse: 65 63  Resp: 18 17  Temp: 97.9 F (36.6 C) (!) 97.5 F (36.4 C)  SpO2: 98% 98%    Constitutional: Alert and oriented. Eyes: Conjunctivae are normal. Head: Atraumatic. Nose: No congestion/rhinnorhea. Mouth/Throat: Mucous membranes are moist.  Neck: No midline cervical spine tenderness to palpation. Cardiovascular: Normal rate, regular rhythm. Grossly normal heart sounds.  2+ radial pulses bilaterally. Respiratory: Normal respiratory effort.  No retractions. Lungs CTAB.  No chest wall tenderness to  palpation. Gastrointestinal: Soft and nontender. No distention. Musculoskeletal: Diffuse tenderness to palpation of right shoulder with no obvious deformity, no tenderness to palpation at right elbow or wrist.  No lower extremity tenderness nor edema.  Neurologic:  Normal speech and language. No gross focal neurologic deficits are appreciated.    ED Results / Procedures / Treatments   Labs (all labs ordered are listed, but only abnormal results are displayed) Labs Reviewed - No data to display  RADIOLOGY Right shoulder x-ray reviewed and interpreted by me with possible dislocation, no fracture noted.  PROCEDURES:  Critical Care performed: No  Procedures   MEDICATIONS ORDERED IN ED: Medications  oxyCODONE-acetaminophen (PERCOCET/ROXICET) 5-325 MG per tablet 1 tablet (has no administration in time range)     IMPRESSION / MDM / ASSESSMENT AND PLAN / ED COURSE  I reviewed the triage vital signs and the nursing notes.                              89 y.o. female with past medical history of hypertension, hyperlipidemia, diabetes, atrial fibrillation on Eliquis, CAD, CHF, and COPD who presents to the ED complaining of right shoulder  pain following a fall.  Patient's presentation is most consistent with acute complicated illness / injury requiring diagnostic workup.  Differential diagnosis includes, but is not limited to, fracture, dislocation, contusion.  Patient nontoxic-appearing and in no acute distress, vital signs are unremarkable.  She has diffuse tenderness to palpation of right shoulder but with no obvious deformity.  X-ray imaging shows no fracture but concerning for possible anterior dislocation.  Clinically, dislocation seems less likely given she is able to reach and touch her left shoulder.  No evidence of traumatic injury to her head, neck, trunk, or lower extremities.  She was given dose of Percocet and CT imaging of right shoulder is negative for acute process.  Pain  improved and we will place patient in sling for comfort.  She is appropriate for discharge home with outpatient follow-up, was counseled to return to the ED for new or worsening symptoms.  Patient agrees with plan.      FINAL CLINICAL IMPRESSION(S) / ED DIAGNOSES   Final diagnoses:  Fall, initial encounter  Contusion of right shoulder, initial encounter     Rx / DC Orders   ED Discharge Orders          Ordered    lidocaine (LIDODERM) 5 %  Every 12 hours        03/30/23 0326             Note:  This document was prepared using Dragon voice recognition software and may include unintentional dictation errors.   Chesley Noon, MD 03/30/23 0330

## 2023-03-30 NOTE — ED Triage Notes (Addendum)
Pt to ED via EMS from [facility], pt had mechanical fall. Pt fell walking with walker on the way back from the bathroom. CBG 234. Pt c/o right upper arm pain, denies hitting head pt is not on blood thinners.

## 2023-04-06 ENCOUNTER — Telehealth: Payer: Self-pay

## 2023-04-06 NOTE — Progress Notes (Signed)
Transition Care Management Unsuccessful Follow-up Telephone Call  Date of discharge and from where:  [place] 1/20  Attempts:  2nd Attempt  Reason for unsuccessful TCM follow-up call:  No answer/busy   Lenard Forth [facility] Guide, Phone: [phone] Fax: [phone] Website: [email or web address]

## 2023-04-06 NOTE — Progress Notes (Signed)
Transition Care Management Unsuccessful Follow-up Telephone Call  Date of discharge and from where:  [place] 1/20  Attempts:  1st Attempt  Reason for unsuccessful TCM follow-up call:  No answer/busy   Lenard Forth [facility] Guide, Phone: [phone] Fax: [phone] Website: [email or web address]

## 2023-04-24 ENCOUNTER — Non-Acute Institutional Stay (SKILLED_NURSING_FACILITY): Payer: Medicare Other | Admitting: Student

## 2023-04-24 ENCOUNTER — Encounter: Payer: Self-pay | Admitting: Student

## 2023-04-24 DIAGNOSIS — K861 Other chronic pancreatitis: Secondary | ICD-10-CM

## 2023-04-24 DIAGNOSIS — J432 Centrilobular emphysema: Secondary | ICD-10-CM | POA: Diagnosis not present

## 2023-04-24 DIAGNOSIS — K859 Acute pancreatitis without necrosis or infection, unspecified: Secondary | ICD-10-CM | POA: Diagnosis not present

## 2023-04-24 DIAGNOSIS — I480 Paroxysmal atrial fibrillation: Secondary | ICD-10-CM | POA: Diagnosis not present

## 2023-04-24 DIAGNOSIS — I5032 Chronic diastolic (congestive) heart failure: Secondary | ICD-10-CM

## 2023-04-24 DIAGNOSIS — E1159 Type 2 diabetes mellitus with other circulatory complications: Secondary | ICD-10-CM

## 2023-04-24 NOTE — Progress Notes (Signed)
Location:  Other [facility].  Nursing Home Room Number: [facility] [address] Place of Service:  SNF [phone]) Provider:  [name], MD  Patient Care Team: [name], MD as PCP - General (Family Medicine) End, Cristal Deer, MD as PCP - Cardiology (Cardiology) Lockie Mola, MD as Referring Physician (Ophthalmology) Sharon Seller, NP as Nurse Practitioner (Geriatric Medicine) [facility], [facility])  Extended Emergency Contact Information Primary Emergency Contact: [name] Address: [address]          [place], [place] [address] [name] Home Phone: [phone] Work Phone: [phone] Relation: Daughter  Code Status:  DNR Goals of care: Advanced Directive information    04/24/2023    4:34 PM  Advanced Directives  Does Patient Have a Medical Advance Directive? Yes  Type of Advance Directive Out of facility DNR (pink MOST or yellow form)  Does patient want to make changes to medical advance directive? No - Patient declined     Chief Complaint  Patient presents with   New Admit To SNF    Admission.     HPI:  Pt is a 89 y.o. female seen for Admission.  History of Present Illness The patient is an 89 year old female with a history of myocardial infarction and chronic respiratory issues who presents with fatigue and shortness of breath. She is accompanied by her daughter.  She experiences significant fatigue, often feeling tired and sleepy throughout the day. She frequently sits down and falls asleep, expressing a desire to feel more energetic.  She has chronic shortness of breath, which is a usual symptom and not a new development. No recent chest pain is reported.  She has a history of emphysema, with past exacerbations leading to hospitalization. She is currently hospitalized with bronchitis.  She has a history of myocardial infarction, which was diagnosed without experiencing any pain. She prefers to avoid invasive  interventions like catheterization.  She mentions occasional leg swelling, which she states is not normal for her. She confirms that she has been using her walker without any recent falls.  She states if it seems like she isn't going to make it, she does not want to go to the hospital. She does not want to be a worry for her children. Confirms her wishes of DNR   Past Medical History:  Diagnosis Date   Acute pancreatitis 07/24/2021   Basal cell carcinoma 03/30/2008   left upper arm/excision   Basal cell carcinoma 03/31/2008   left upper arm, right lower leg   Basal cell carcinoma 01/18/2009   left upper arm   Collagenous colitis    Diabetes mellitus without complication (HCC)    Diverticulitis    Elevated LFTs 07/25/2021   Heart murmur    at birth, none since   History of echocardiogram    a. 11/2015: echo showing EF of 60-65% with no WMA. Grade 1 DD and mild MR noted.    Hyperlipidemia    Per [facility]   Hypertension    Intertrochanteric fracture of right femur (HCC) 06/11/2019   Lack of coordination    Per [facility] term current use of anticoagulant    Per [facility]   Noninfective gastroenteritis and colitis, unspecified    Per [facility]   PAF (paroxysmal atrial fibrillation) (HCC)    a. initially diagnosed in 08/2016 --> started on Eliquis   Squamous cell carcinoma of skin 05/16/2020   Left nasal tip anterior to ala, refer to  MOHs   Syncope    a. initially occurring in Fall 2016 b. Hospitalized in 10/2015 and 11/2015 for recurrent episodes.    Past Surgical History:  Procedure Laterality Date   ABDOMINAL HYSTERECTOMY     BACK SURGERY  08/08/2008   Lumbar discectomy   COLONOSCOPY WITH PROPOFOL     EXCISION/RELEASE BURSA HIP Left 05/03/2019   Procedure: HIP ABDUCTOR REPAIR;  Surgeon: Kennedy Bucker, MD;  Location: [facility] ORS;  Service: Orthopedics;  Laterality: Left;   INTRAMEDULLARY (IM) NAIL INTERTROCHANTERIC Right 06/13/2019   Procedure: INTRAMEDULLARY (IM)  NAIL INTERTROCHANTRIC;  Surgeon: Kennedy Bucker, MD;  Location: [facility] ORS;  Service: Orthopedics;  Laterality: Right;   NECK SURGERY  1980    Allergies  Allergen Reactions   Morphine Sulfate Other (See Comments)    BP bottoms out   Penicillins Diarrhea    Has patient had a PCN reaction causing immediate rash, facial/tongue/throat swelling, SOB or lightheadedness with hypotension: Yes Has patient had a PCN reaction causing severe rash involving mucus membranes or skin necrosis: Yes Has patient had a PCN reaction that required hospitalization No Has patient had a PCN reaction occurring within the last 10 years: No If all of the above answers are "NO", then may proceed with Cephalosporin use.    Outpatient Encounter Medications as of 04/24/2023  Medication Sig   acetaminophen (TYLENOL) 325 MG tablet Take 650 mg by mouth 2 (two) times daily. Give 2 tablets by mouth every 4 hours as needed.   apixaban (ELIQUIS) 5 MG TABS tablet Take 1 tablet (5 mg total) by mouth 2 (two) times daily.   Cholecalciferol (VITAMIN D) 50 MCG (2000 UT) CAPS Give one capsule by mouth once daily .   Clobetasol Propionate 0.05 % shampoo Apply 1 Application topically every morning. And wash out   empagliflozin (JARDIANCE) 10 MG TABS tablet Take 1 tablet (10 mg total) by mouth daily before breakfast.   Ensure (ENSURE) Take 237 mLs by mouth as needed (For weight loss or decreased intake).   ezetimibe (ZETIA) 10 MG tablet Take 1 tablet (10 mg total) by mouth daily.   fluocinolone (SYNALAR) 0.01 % external solution Apply 1 Application topically at bedtime. Apply to scalp   furosemide (LASIX) 20 MG tablet Take 20 mg by mouth every other day. Give one tablet by mouth as needed.   glipiZIDE (GLUCOTROL XL) 10 MG 24 hr tablet Take 10 mg by mouth 2 (two) times daily.   Infant Care Products [facility] EX) Apply to buttocks/periarea topically every shift   linagliptin (TRADJENTA) 5 MG TABS tablet Take 1 tablet (5 mg total) by mouth  daily.   lisinopril (ZESTRIL) 20 MG tablet TAKE 1 TABLET BY MOUTH DAILY   metFORMIN (GLUCOPHAGE-XR) 500 MG 24 hr tablet Take 1 tablet (500 mg total) by mouth 2 (two) times daily.   metoprolol succinate (TOPROL-XL) 25 MG 24 hr tablet TAKE 1 TABLET BY MOUTH ONCE DAILY   Multiple Vitamin (MULTIVITAMIN WITH MINERALS) TABS tablet Take 1 tablet by mouth daily.   Wheat Dextrin (BENEFIBER PO) Take 1 each by mouth 2 (two) times daily.   albuterol (VENTOLIN HFA) 108 (90 Base) MCG/ACT inhaler Inhale 2 puffs into the lungs every 6 (six) hours as needed for wheezing or shortness of breath. (Patient not taking: Reported on 04/24/2023)   alum & mag hydroxide-simeth (MAALOX/MYLANTA) 200-200-20 MG/5 SUSP Give 30ml by mouth every 4 hours as needed. (Patient not taking: Reported on 04/24/2023)   bisacodyl (DULCOLAX) 10 MG suppository Place 10 mg  rectally as needed for moderate constipation. (Patient not taking: Reported on 04/24/2023)   bismuth subsalicylate (PEPTO BISMOL) 262 MG/15ML suspension Give 10 cc by mouth as needed for loose stool supervised self-administration (Patient not taking: Reported on 04/24/2023)   carbamide peroxide (DEBROX) 6.5 % OTIC solution Place 5 drops into both ears as needed. (Patient not taking: Reported on 04/24/2023)   cetirizine (ZYRTEC) 10 MG tablet Take 10 mg by mouth daily as needed for allergies. (Patient not taking: Reported on 04/24/2023)   dextromethorphan-guaiFENesin (ROBITUSSIN-DM) 10-100 MG/5ML liquid Give 2 tsp by mouth every 4 hours as needed for coughing. supervised self-administration Notify MD if no relief in 3 days or continued use (Patient not taking: Reported on 04/24/2023)   dextrose (GLUTOSE) 40 % GEL Take 1 Tube by mouth as needed for low blood sugar. (Patient not taking: Reported on 04/24/2023)   ipratropium-albuterol (DUONEB) 0.5-2.5 (3) MG/3ML SOLN Take 3 mLs by nebulization every 4 (four) hours as needed. (Patient not taking: Reported on 04/24/2023)   lidocaine  (LIDODERM) 5 % Place 1 patch onto the skin every 12 (twelve) hours. Remove & Discard patch within 12 hours or as directed by MD (Patient not taking: Reported on 04/24/2023)   magnesium hydroxide (MILK OF MAGNESIA) 400 MG/5ML suspension Give 2 Tbsp by mouth as needed for constipation. supervised self-administration daily -- Call MD if no relief in 3 days of continued use (Patient not taking: Reported on 04/24/2023)   nystatin (MYCOSTATIN/NYSTOP) powder Apply to excoriated area topically as needed Twice daily. (Patient not taking: Reported on 04/24/2023)   ondansetron (ZOFRAN) 4 MG tablet Take 4 mg by mouth every 8 (eight) hours as needed for nausea or vomiting. (Patient not taking: Reported on 04/24/2023)   OXYGEN 2lpm as needed for dyspnea/SOB (Patient not taking: Reported on 04/24/2023)   polyethylene glycol (MIRALAX / GLYCOLAX) 17 g packet Take 17 g by mouth daily as needed. (Patient not taking: Reported on 04/24/2023)   No facility-administered encounter medications on file as of 04/24/2023.    Review of Systems  Immunization History  Administered Date(s) Administered   Fluad Quad(high Dose 65+) 12/20/2018   Influenza Split 01/01/2009, 01/02/2010, 01/13/2011, 12/22/2011   Influenza, High Dose Seasonal PF 01/18/2014, 01/01/2015, 11/30/2015, 12/01/2016, 01/11/2018, 12/08/2019, 12/26/2022   Influenza-Unspecified 01/08/2013, 11/08/2020, 12/25/2021   Moderna Covid-19 Fall Seasonal Vaccine 16yrs & older 06/17/2022, 12/05/2022   Moderna Covid-19 Vaccine Bivalent Booster 61yrs & up 11/30/2020, 08/06/2021, 01/17/2022   PFIZER Comirnaty(Gray Top)Covid-19 Tri-Sucrose Vaccine 07/27/2020   PFIZER(Purple Top)SARS-COV-2 Vaccination 04/01/2019, 04/22/2019, 12/05/2019, 07/27/2020   Pfizer Covid-19 Vaccine Bivalent Booster 63yrs & up 11/30/2020   Pneumococcal Conjugate-13 10/19/2013   Pneumococcal Polysaccharide-23 01/02/2010   Td 11/02/2003, 01/13/2011   Tdap 01/13/2011, 02/28/2015   Zoster  Recombinant(Shingrix) 08/11/2022, 11/25/2022   Zoster, Live 07/14/2007   Pertinent  Health Maintenance Due  Topic Date Due   OPHTHALMOLOGY EXAM  06/08/2021   HEMOGLOBIN A1C  05/20/2023   FOOT EXAM  08/06/2023   INFLUENZA VACCINE  Completed   DEXA SCAN  Completed      07/24/2021    4:09 PM 07/24/2021   10:50 PM 07/25/2021   10:02 AM 07/26/2021    1:00 AM 09/18/2022    9:32 AM  Fall Risk  Falls in the past year?     0  (RETIRED) Patient Fall Risk Level Moderate fall risk High fall risk High fall risk High fall risk    Functional Status Survey:    Vitals:   04/24/23 1625  BP: 109/62  Pulse: 64  Resp: 20  Temp: (!) 97 F (36.1 C)  SpO2: 96%  Weight: 158 lb (71.7 kg)  Height: 5\' 1"  (1.549 m)   Body mass index is 29.85 kg/m. Physical Exam  Labs reviewed: Recent Labs    05/10/22 0104 05/11/22 0313 05/12/22 0533 05/13/22 0704 05/14/22 0351 07/24/22 0000 08/07/22 0000 08/14/22 0000 11/20/22 0000  NA 136   < > 137 134* 137   < > 140 139 141  K 3.4*   < > 3.6 3.7 3.7   < > 4.6 4.5 4.5  CL 103   < > 104 105 104   < > 105 105 104  CO2 24   < > 23 23 24    < > 30* 28* 30*  GLUCOSE 246*   < > 205* 203* 231*  --   --   --   --   BUN 24*   < > 13 14 19    < > 31* 28* 32*  CREATININE 0.79   < > 0.66 0.72 0.78   < > 1.0 0.9 1.0  CALCIUM 8.7*   < > 8.5* 8.5* 8.6*   < > 9.4 9.4 9.4  MG 1.6*  --   --   --   --   --   --   --   --    < > = values in this interval not displayed.   Recent Labs    05/12/22 0533 05/13/22 0704 05/14/22 0351 08/07/22 0000 11/20/22 0000  AST 162* 67* 40 13 13  ALT 308* 219* 169* 15 17  ALKPHOS 122 142* 136* 44 62  BILITOT 1.2 0.9 0.6  --   --   PROT 5.4* 5.5* 5.7*  --   --   ALBUMIN 2.5* 2.6* 2.6* 3.9 4.0   Recent Labs    05/12/22 0533 05/13/22 0704 05/14/22 0351 08/07/22 0000  WBC 6.1 8.3 8.3 6.6  NEUTROABS 3.0 3.7 3.1 2,191.00  HGB 11.4* 11.8* 11.5* 12.8  HCT 34.9* 35.9* 34.8* 38  MCV 89.0 89.8 88.3  --   PLT 205 223 241 267    Lab Results  Component Value Date   TSH 2.284 04/17/2022   Lab Results  Component Value Date   HGBA1C 7.5 11/20/2022   Lab Results  Component Value Date   CHOL 241 (A) 11/20/2022   HDL 43 11/20/2022   LDLCALC 165 11/20/2022   TRIG 179 (A) 11/20/2022   CHOLHDL 5.2 05/10/2022    Significant Diagnostic Results in last 30 days:  CT Shoulder Right Wo Contrast Result Date: 03/30/2023 CLINICAL DATA:  Shoulder trauma, instability or dislocation suspected, EXAM: CT OF THE UPPER RIGHT EXTREMITY WITHOUT CONTRAST TECHNIQUE: Multidetector CT imaging of the upper right extremity was performed according to the standard protocol. RADIATION DOSE REDUCTION: This exam was performed according to the departmental dose-optimization program which includes automated exposure control, adjustment of the mA and/or kV according to patient size and/or use of iterative reconstruction technique. COMPARISON:  Radiograph earlier today FINDINGS: Bones/Joint/Cartilage No acute fracture or dislocation. Mild degenerative arthritis of the glenohumeral joint and advanced AC joint arthritis. Ligaments Suboptimally assessed by CT. Muscles and Tendons Calcification within the supraspinatus compatible with tendinopathy. Mild atrophy of the supraspinatus tendon. Soft tissues No acute abnormality IMPRESSION: No acute fracture or dislocation. Electronically Signed   By: Minerva Fester M.D.   On: 03/30/2023 02:58   DG Humerus Right Result Date: 03/30/2023 CLINICAL DATA:  Mechanical fall with subsequent right upper arm pain.  EXAM: RIGHT HUMERUS - 2+ VIEW COMPARISON:  None Available. FINDINGS: Overlap of the humeral head in the glenoid is suspicious for dislocation, presumably anteriorly though this is not definitive on the given images. Suggestion of impaction of the humeral head and possible glenoid fracture. IMPRESSION: Suspected anterior dislocation of the humeral head with humeral head impaction and possible glenoid fracture.  Consider CT for further evaluation. Electronically Signed   By: Minerva Fester M.D.   On: 03/30/2023 02:12    Assessment/Plan Chronic heart failure with preserved ejection fraction (HFpEF) (HCC), Chronic  Acute on chronic pancreatitis (HCC), Chronic  Centrilobular emphysema (HCC), Chronic  Paroxysmal atrial fibrillation (HCC), Chronic  Type 2 diabetes mellitus with other circulatory complication, without long-term current use of insulin (HCC), Chronic Emphysema Currently hospitalized with bronchitis. Reports significant fatigue but no new chest pain or acute shortness of breath. - Continue current treatment - Monitor respiratory status closely  Diabetes Mellitus Diabetes mellitus with difficulty managing due to dexterity issues. Concern about sugar intake in the facility. Discussed low-dose nighttime insulin for better glucose control. - Take 10 units of Lantus at bedtime. - Check your blood glucose (BG) every morning while fasting. --- For 3 days, if all fasting BG are over 180, increase by 2u more bedtime Lantus. --- For 3 days, if all fasting BG are 130-180, increase by 1u more bedtime Lantus. --- For 3 days, if all fasting BG are 80-130, no medication change. --- If BG below 80, or symptoms of low BG, then decrease insulin by 4 units. --- Repeat - Educate staff on sugar intake vigilance  Hx of NSTEMI History of myocardial infarction. Prefers to avoid invasive interventions like catheterization. Chest pain may not warrant hospital transfer due to refusal of invasive procedures. - Avoid invasive cardiac interventions - Monitor for new cardiac symptoms  General Health Maintenance Encouraged participation in physical and social activities to maintain cognitive function and well-being. - Encourage participation in activities - Monitor cognitive function  Goals of Care Prefers comfort measures over life-prolonging interventions. Has a DNR order and wishes to avoid hospital  transfers unless necessary. Aims to avoid burdening children and prefers peaceful passing if condition deteriorates. - Respect DNR order - Provide comfort measures - Use clinical judgment for hospital transfers.   Family/ staff Communication: Daughter, nursing  Labs/tests ordered:  CBC, BMP, A1c q1mo  I spent greater than 50  minutes for the care of this patient in face to face time, chart review, clinical documentation, patient education.

## 2023-04-25 ENCOUNTER — Encounter: Payer: Self-pay | Admitting: Student

## 2023-04-27 LAB — COMPREHENSIVE METABOLIC PANEL
Calcium: 8.7 (ref 8.7–10.7)
eGFR: 51

## 2023-04-27 LAB — BASIC METABOLIC PANEL
BUN: 38 — AB (ref 4–21)
CO2: 22 (ref 13–22)
Chloride: 109 — AB (ref 99–108)
Creatinine: 1.1 (ref 0.5–1.1)
Glucose: 146
Potassium: 4.3 meq/L (ref 3.5–5.1)
Sodium: 141 (ref 137–147)

## 2023-04-27 LAB — CBC: RBC: 4.16 (ref 3.87–5.11)

## 2023-04-27 LAB — CBC AND DIFFERENTIAL
HCT: 37 (ref 36–46)
Hemoglobin: 12.4 (ref 12.0–16.0)
Neutrophils Absolute: 1830
Platelets: 237 10*3/uL (ref 150–400)
WBC: 6.1

## 2023-04-30 ENCOUNTER — Encounter: Payer: Self-pay | Admitting: Nurse Practitioner

## 2023-04-30 ENCOUNTER — Non-Acute Institutional Stay (SKILLED_NURSING_FACILITY): Payer: Medicare Other | Admitting: Nurse Practitioner

## 2023-04-30 DIAGNOSIS — N939 Abnormal uterine and vaginal bleeding, unspecified: Secondary | ICD-10-CM

## 2023-04-30 NOTE — Progress Notes (Signed)
Location:  Other [facility].  Nursing Home Room Number: [facility] [address]A Place of Service:  SNF [phone]) Abbey Chatters NP  PCP: [name], MD  Patient Care Team: [name], MD as PCP - General (Family Medicine) End, Cristal Deer, MD as PCP - Cardiology (Cardiology) Lockie Mola, MD as Referring Physician (Ophthalmology) Sharon Seller, NP as Nurse Practitioner (Geriatric Medicine) Pa, [facility])  Extended Emergency Contact Information Primary Emergency Contact: [name] Address: [address]          [place], [place] [address] [name] of [place] Home Phone: [phone] Work Phone: [phone] Relation: Daughter  Goals of care: Advanced Directive information    04/24/2023    4:34 PM  Advanced Directives  Does Patient Have a Medical Advance Directive? Yes  Type of Advance Directive Out of facility DNR (pink MOST or yellow form)  Does patient want to make changes to medical advance directive? No - Patient declined     Chief Complaint  Patient presents with   Vaginal Bleeding    Vaginal Bleeding.     HPI:  Pt is a 89 y.o. female seen today for an acute visit for Vaginal Bleeding.  Nursing noted that patient was having vaginal bleeding starting today Pt reports she has never had this before.  Pt denies vaginal or pelvic pain No reports of trauma Denies sexual activity  No dizziness, lightheadedness  Denies palpitations or chest pains- has a fib and on eliquis Hx of abdominal hysterectomy noted on her chart  Past Medical History:  Diagnosis Date   Acute pancreatitis 07/24/2021   Basal cell carcinoma 03/30/2008   left upper arm/excision   Basal cell carcinoma 03/31/2008   left upper arm, right lower leg   Basal cell carcinoma 01/18/2009   left upper arm   Collagenous colitis    Diabetes mellitus without complication (HCC)    Diverticulitis    Elevated LFTs 07/25/2021   Heart murmur    at birth, none since    History of echocardiogram    a. 11/2015: echo showing EF of 60-65% with no WMA. Grade 1 DD and mild MR noted.    Hyperlipidemia    Per [facility]   Hypertension    Intertrochanteric fracture of right femur (HCC) 06/11/2019   Lack of coordination    Per [facility] term current use of anticoagulant    Per [facility]   Noninfective gastroenteritis and colitis, unspecified    Per [facility]   PAF (paroxysmal atrial fibrillation) (HCC)    a. initially diagnosed in 08/2016 --> started on Eliquis   Squamous cell carcinoma of skin 05/16/2020   Left nasal tip anterior to ala, refer to [facility]   Syncope    a. initially occurring in Fall 2016 b. Hospitalized in 10/2015 and 11/2015 for recurrent episodes.    Past Surgical History:  Procedure Laterality Date   ABDOMINAL HYSTERECTOMY     BACK SURGERY  08/08/2008   Lumbar discectomy   COLONOSCOPY WITH PROPOFOL     EXCISION/RELEASE BURSA HIP Left 05/03/2019   Procedure: HIP ABDUCTOR REPAIR;  Surgeon: Kennedy Bucker, MD;  Location: [facility] ORS;  Service: Orthopedics;  Laterality: Left;   INTRAMEDULLARY (IM) NAIL INTERTROCHANTERIC Right 06/13/2019   Procedure: INTRAMEDULLARY (IM) NAIL INTERTROCHANTRIC;  Surgeon: Kennedy Bucker, MD;  Location: [facility] ORS;  Service: Orthopedics;  Laterality: Right;   NECK SURGERY  1980    Allergies  Allergen Reactions   Morphine Sulfate Other (See Comments)  BP bottoms out   Penicillins Diarrhea    Has patient had a PCN reaction causing immediate rash, facial/tongue/throat swelling, SOB or lightheadedness with hypotension: Yes Has patient had a PCN reaction causing severe rash involving mucus membranes or skin necrosis: Yes Has patient had a PCN reaction that required hospitalization No Has patient had a PCN reaction occurring within the last 10 years: No If all of the above answers are "NO", then may proceed with Cephalosporin use.    Outpatient Encounter Medications as of 04/30/2023  Medication Sig    acetaminophen (TYLENOL) 325 MG tablet Take 650 mg by mouth 2 (two) times daily.   apixaban (ELIQUIS) 5 MG TABS tablet Take 1 tablet (5 mg total) by mouth 2 (two) times daily.   Cholecalciferol (VITAMIN D) 50 MCG (2000 UT) CAPS Give one capsule by mouth once daily .   Clobetasol Propionate 0.05 % shampoo Apply 1 Application topically every morning. And wash out   empagliflozin (JARDIANCE) 10 MG TABS tablet Take 1 tablet (10 mg total) by mouth daily before breakfast.   Ensure (ENSURE) Take 237 mLs by mouth as needed (For weight loss or decreased intake).   ezetimibe (ZETIA) 10 MG tablet Take 1 tablet (10 mg total) by mouth daily.   fluocinolone (SYNALAR) 0.01 % external solution Apply 1 Application topically at bedtime. Apply to scalp   furosemide (LASIX) 20 MG tablet Take 20 mg by mouth every other day. Give one tablet by mouth as needed.   glipiZIDE (GLUCOTROL XL) 10 MG 24 hr tablet Take 10 mg by mouth 2 (two) times daily.   Infant Care Products [facility] EX) Apply to buttocks/periarea topically every shift   linagliptin (TRADJENTA) 5 MG TABS tablet Take 5 mg by mouth daily.   lisinopril (ZESTRIL) 20 MG tablet TAKE 1 TABLET BY MOUTH DAILY   metFORMIN (GLUCOPHAGE-XR) 500 MG 24 hr tablet Take 500 mg by mouth 2 (two) times daily with a meal.   metoprolol succinate (TOPROL-XL) 25 MG 24 hr tablet TAKE 1 TABLET BY MOUTH ONCE DAILY   Multiple Vitamin (MULTIVITAMIN WITH MINERALS) TABS tablet Take 1 tablet by mouth daily.   Wheat Dextrin (BENEFIBER PO) Take 1 each by mouth 2 (two) times daily.   No facility-administered encounter medications on file as of 04/30/2023.    Review of Systems  Constitutional:  Negative for activity change, appetite change, fatigue and unexpected weight change.  HENT:  Negative for congestion and hearing loss.   Eyes: Negative.   Respiratory:  Negative for cough and shortness of breath.   Cardiovascular:  Negative for chest pain, palpitations and leg swelling.   Gastrointestinal:  Negative for abdominal pain, constipation and diarrhea.  Genitourinary:  Negative for difficulty urinating and dysuria.  Musculoskeletal:  Negative for arthralgias and myalgias.  Skin:  Negative for color change and wound.  Neurological:  Negative for dizziness and weakness.  Psychiatric/Behavioral:  Negative for agitation, behavioral problems and confusion.     Immunization History  Administered Date(s) Administered   Fluad Quad(high Dose 65+) 12/20/2018   Influenza Split 01/01/2009, 01/02/2010, 01/13/2011, 12/22/2011   Influenza, High Dose Seasonal PF 01/18/2014, 01/01/2015, 11/30/2015, 12/01/2016, 01/11/2018, 12/08/2019, 12/26/2022   Influenza-Unspecified 01/08/2013, 11/08/2020, 12/25/2021   Moderna Covid-19 Fall Seasonal Vaccine 69yrs & older 06/17/2022, 12/05/2022   Moderna Covid-19 Vaccine Bivalent Booster 52yrs & up 11/30/2020, 08/06/2021, 01/17/2022   PFIZER Comirnaty(Gray Top)Covid-19 Tri-Sucrose Vaccine 07/27/2020   PFIZER(Purple Top)SARS-COV-2 Vaccination 04/01/2019, 04/22/2019, 12/05/2019, 07/27/2020   Pfizer Covid-19 Vaccine Bivalent Booster 38yrs & up  11/30/2020   Pneumococcal Conjugate-13 10/19/2013   Pneumococcal Polysaccharide-23 01/02/2010   Td 11/02/2003, 01/13/2011   Tdap 01/13/2011, 02/28/2015   Zoster Recombinant(Shingrix) 08/11/2022, 11/25/2022   Zoster, Live 07/14/2007   Pertinent  Health Maintenance Due  Topic Date Due   OPHTHALMOLOGY EXAM  06/08/2021   HEMOGLOBIN A1C  05/20/2023   FOOT EXAM  08/06/2023   INFLUENZA VACCINE  Completed   DEXA SCAN  Completed      07/24/2021    4:09 PM 07/24/2021   10:50 PM 07/25/2021   10:02 AM 07/26/2021    1:00 AM 09/18/2022    9:32 AM  Fall Risk  Falls in the past year?     0  (RETIRED) Patient Fall Risk Level Moderate fall risk High fall risk High fall risk High fall risk    Functional Status Survey:    Vitals:   04/30/23 1507  BP: (!) 99/59  Pulse: 67  Resp: 18  Temp: 97.8 F (36.6 C)   SpO2: 96%  Weight: 154 lb (69.9 kg)  Height: 5\' 1"  (1.549 m)   Body mass index is 29.1 kg/m. Physical Exam Exam conducted with a chaperone present.  Constitutional:      General: She is not in acute distress.    Appearance: She is well-developed. She is not diaphoretic.  HENT:     Head: Normocephalic and atraumatic.     Mouth/Throat:     Pharynx: No oropharyngeal exudate.  Eyes:     Conjunctiva/sclera: Conjunctivae normal.     Pupils: Pupils are equal, round, and reactive to light.  Cardiovascular:     Rate and Rhythm: Normal rate. Rhythm irregular.     Heart sounds: Normal heart sounds.  Pulmonary:     Effort: Pulmonary effort is normal.     Breath sounds: Normal breath sounds.  Abdominal:     General: Bowel sounds are normal. There is no distension.     Palpations: Abdomen is soft. There is no mass.     Tenderness: There is no abdominal tenderness.     Hernia: No hernia is present.  Genitourinary:    Comments: Bleeding noted to vaginal area, clots noted on exam Musculoskeletal:     Cervical back: Normal range of motion and neck supple.     Right lower leg: No edema.     Left lower leg: No edema.  Skin:    General: Skin is warm and dry.  Neurological:     Mental Status: She is alert.  Psychiatric:        Mood and Affect: Mood normal.     Labs reviewed: Recent Labs    05/10/22 0104 05/11/22 0313 05/12/22 0533 05/13/22 0704 05/14/22 0351 07/24/22 0000 08/14/22 0000 11/20/22 0000 04/27/23 0000  NA 136   < > 137 134* 137   < > 139 141 141  K 3.4*   < > 3.6 3.7 3.7   < > 4.5 4.5 4.3  CL 103   < > 104 105 104   < > 105 104 109*  CO2 24   < > 23 23 24    < > 28* 30* 22  GLUCOSE 246*   < > 205* 203* 231*  --   --   --   --   BUN 24*   < > 13 14 19    < > 28* 32* 38*  CREATININE 0.79   < > 0.66 0.72 0.78   < > 0.9 1.0 1.1  CALCIUM 8.7*   < >  8.5* 8.5* 8.6*   < > 9.4 9.4 8.7  MG 1.6*  --   --   --   --   --   --   --   --    < > = values in this interval not  displayed.   Recent Labs    05/12/22 0533 05/13/22 0704 05/14/22 0351 08/07/22 0000 11/20/22 0000  AST 162* 67* 40 13 13  ALT 308* 219* 169* 15 17  ALKPHOS 122 142* 136* 44 62  BILITOT 1.2 0.9 0.6  --   --   PROT 5.4* 5.5* 5.7*  --   --   ALBUMIN 2.5* 2.6* 2.6* 3.9 4.0   Recent Labs    05/12/22 0533 05/13/22 0704 05/14/22 0351 08/07/22 0000 04/27/23 0000  WBC 6.1 8.3 8.3 6.6 6.1  NEUTROABS 3.0 3.7 3.1 2,191.00 1,830.00  HGB 11.4* 11.8* 11.5* 12.8 12.4  HCT 34.9* 35.9* 34.8* 38 37  MCV 89.0 89.8 88.3  --   --   PLT 205 223 241 267 237   Lab Results  Component Value Date   TSH 2.284 04/17/2022   Lab Results  Component Value Date   HGBA1C 7.5 11/20/2022   Lab Results  Component Value Date   CHOL 241 (A) 11/20/2022   HDL 43 11/20/2022   LDLCALC 165 11/20/2022   TRIG 179 (A) 11/20/2022   CHOLHDL 5.2 05/10/2022    Significant Diagnostic Results in last 30 days:  No results found.  Assessment/Plan 1. Vaginal bleeding (Primary) -will get cbc stat -hold eliquis  -gyn referral for further evaluation -VS q 8 hours for 72 hours.      Janene Harvey. Biagio [facility] [phone]

## 2023-05-20 ENCOUNTER — Ambulatory Visit: Admitting: Urology

## 2023-05-20 ENCOUNTER — Encounter: Payer: Self-pay | Admitting: Urology

## 2023-05-20 VITALS — BP 104/63 | HR 76 | Ht 61.0 in | Wt 154.0 lb

## 2023-05-20 DIAGNOSIS — N368 Other specified disorders of urethra: Secondary | ICD-10-CM

## 2023-05-20 NOTE — Progress Notes (Unsigned)
 I, Maysun Anabel Bene, acting as a scribe for Riki Altes, MD., have documented all relevant documentation on the behalf of Riki Altes, MD, as directed by Riki Altes, MD while in the presence of Riki Altes, MD.  05/20/2023 11:55 AM   [name] Dec 15, 1931 [id number]  Referring provider: Romana Juniper, MD [address] [place],  [place] [address]  Chief Complaint  Patient presents with   Establish Care    HPI: [name] is a 89 y.o. female referred for evaluation of the urethral mass,   Recently saw Gyn for vaginal bleeding and found to have a urethral mass. She was on Eliquis and her vaginal bleeding stopped with discontinuing, however she was recently restarted.  History of chronic urinary incontinence, which has not worsened. No gross hematuria.    PMH: Past Medical History:  Diagnosis Date   Acute pancreatitis 07/24/2021   Basal cell carcinoma 03/30/2008   left upper arm/excision   Basal cell carcinoma 03/31/2008   left upper arm, right lower leg   Basal cell carcinoma 01/18/2009   left upper arm   Collagenous colitis    Diabetes mellitus without complication (HCC)    Diverticulitis    Elevated LFTs 07/25/2021   Heart murmur    at birth, none since   History of echocardiogram    a. 11/2015: echo showing EF of 60-65% with no WMA. Grade 1 DD and mild MR noted.    Hyperlipidemia    Per [facility]   Hypertension    Intertrochanteric fracture of right femur (HCC) 06/11/2019   Lack of coordination    Per [facility] term current use of anticoagulant    Per [facility]   Noninfective gastroenteritis and colitis, unspecified    Per [facility]   PAF (paroxysmal atrial fibrillation) (HCC)    a. initially diagnosed in 08/2016 --> started on Eliquis   Squamous cell carcinoma of skin 05/16/2020   Left nasal tip anterior to ala, refer to [facility]   Syncope    a. initially occurring in Fall 2016 b. Hospitalized in 10/2015 and  11/2015 for recurrent episodes.     Surgical History: Past Surgical History:  Procedure Laterality Date   ABDOMINAL HYSTERECTOMY     BACK SURGERY  08/08/2008   Lumbar discectomy   COLONOSCOPY WITH PROPOFOL     EXCISION/RELEASE BURSA HIP Left 05/03/2019   Procedure: HIP ABDUCTOR REPAIR;  Surgeon: Kennedy Bucker, MD;  Location: [facility] ORS;  Service: Orthopedics;  Laterality: Left;   INTRAMEDULLARY (IM) NAIL INTERTROCHANTERIC Right 06/13/2019   Procedure: INTRAMEDULLARY (IM) NAIL INTERTROCHANTRIC;  Surgeon: Kennedy Bucker, MD;  Location: [facility] ORS;  Service: Orthopedics;  Laterality: Right;   NECK SURGERY  1980    Home Medications:  Allergies as of 05/20/2023       Reactions   Morphine Sulfate Other (See Comments)   BP bottoms out   Penicillins Diarrhea   Has patient had a PCN reaction causing immediate rash, facial/tongue/throat swelling, SOB or lightheadedness with hypotension: Yes Has patient had a PCN reaction causing severe rash involving mucus membranes or skin necrosis: Yes Has patient had a PCN reaction that required hospitalization No Has patient had a PCN reaction occurring within the last 10 years: No If all of the above answers are "NO", then may proceed with Cephalosporin use.        Medication List        Accurate as of May 20, 2023 11:55 AM. If you have any questions, ask your nurse or doctor.          acetaminophen 325 MG tablet Commonly known as: TYLENOL Take 650 mg by mouth 2 (two) times daily.   apixaban 5 MG Tabs tablet Commonly known as: ELIQUIS Take 1 tablet (5 mg total) by mouth 2 (two) times daily.   BENEFIBER PO Take 1 each by mouth 2 (two) times daily.   Clobetasol Propionate 0.05 % shampoo Apply 1 Application topically every morning. And wash out   DERMACLOUD EX Apply to buttocks/periarea topically every shift   empagliflozin 10 MG Tabs tablet Commonly known as: Jardiance Take 1 tablet (10 mg total) by mouth daily before breakfast.    Ensure Take 237 mLs by mouth as needed (For weight loss or decreased intake).   ezetimibe 10 MG tablet Commonly known as: Zetia Take 1 tablet (10 mg total) by mouth daily.   fluocinolone 0.01 % external solution Commonly known as: SYNALAR Apply 1 Application topically at bedtime. Apply to scalp   furosemide 20 MG tablet Commonly known as: LASIX Take 20 mg by mouth every other day. Give one tablet by mouth as needed.   glipiZIDE 10 MG 24 hr tablet Commonly known as: GLUCOTROL XL Take 10 mg by mouth 2 (two) times daily.   linagliptin 5 MG Tabs tablet Commonly known as: TRADJENTA Take 5 mg by mouth daily.   lisinopril 20 MG tablet Commonly known as: ZESTRIL TAKE 1 TABLET BY MOUTH DAILY   metFORMIN 500 MG 24 hr tablet Commonly known as: GLUCOPHAGE-XR Take 500 mg by mouth 2 (two) times daily with a meal.   metoprolol succinate 25 MG 24 hr tablet Commonly known as: TOPROL-XL TAKE 1 TABLET BY MOUTH ONCE DAILY   multivitamin with minerals Tabs tablet Take 1 tablet by mouth daily.   Vitamin D 50 MCG (2000 UT) Caps Give one capsule by mouth once daily .        Allergies:  Allergies  Allergen Reactions   Morphine Sulfate Other (See Comments)    BP bottoms out   Penicillins Diarrhea    Has patient had a PCN reaction causing immediate rash, facial/tongue/throat swelling, SOB or lightheadedness with hypotension: Yes Has patient had a PCN reaction causing severe rash involving mucus membranes or skin necrosis: Yes Has patient had a PCN reaction that required hospitalization No Has patient had a PCN reaction occurring within the last 10 years: No If all of the above answers are "NO", then may proceed with Cephalosporin use.    Family History: Family History  Problem Relation Age of Onset   Hypertension Mother    Diabetes Mother    Lung cancer Father     Social History:  reports that she quit smoking about 45 years ago. Her smoking use included cigarettes. She has  never used smokeless tobacco. She reports that she does not currently use alcohol. She reports that she does not use drugs.   Physical Exam: BP 104/63   Pulse 76   Ht 5\' 1"  (1.549 m)   Wt 154 lb (69.9 kg)   BMI 29.10 kg/m   Constitutional:  Alert and oriented, No acute distress. HEENT: [place] AT Respiratory: Normal respiratory effort, no increased work of breathing. GU: 1-1.5 cm mass protruding from the inferior aspect of the urethral meatus Psychiatric: Normal mood and affect.   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 Sherron Monday.  I have reviewed the above documentation for accuracy and completeness, and I agree with the above.   Riki Altes, MD  [facility] [address], [address] [place], [place] [address] [phone]

## 2023-05-27 ENCOUNTER — Non-Acute Institutional Stay (SKILLED_NURSING_FACILITY): Payer: Self-pay | Admitting: Student

## 2023-05-27 ENCOUNTER — Encounter: Payer: Self-pay | Admitting: Student

## 2023-05-27 DIAGNOSIS — I10 Essential (primary) hypertension: Secondary | ICD-10-CM | POA: Diagnosis not present

## 2023-05-27 DIAGNOSIS — I5032 Chronic diastolic (congestive) heart failure: Secondary | ICD-10-CM

## 2023-05-27 DIAGNOSIS — K52831 Collagenous colitis: Secondary | ICD-10-CM

## 2023-05-27 DIAGNOSIS — I480 Paroxysmal atrial fibrillation: Secondary | ICD-10-CM | POA: Diagnosis not present

## 2023-05-27 DIAGNOSIS — E1159 Type 2 diabetes mellitus with other circulatory complications: Secondary | ICD-10-CM

## 2023-05-27 DIAGNOSIS — Z90710 Acquired absence of both cervix and uterus: Secondary | ICD-10-CM

## 2023-05-27 DIAGNOSIS — N368 Other specified disorders of urethra: Secondary | ICD-10-CM

## 2023-05-27 NOTE — Progress Notes (Signed)
 Location:  Other [facility].  Nursing Home Room Number: [facility] [address]A Place of Service:  SNF [phone]) Provider:  [name], MD  Patient Care Team: [name], MD as PCP - General (Family Medicine) End, Cristal Deer, MD as PCP - Cardiology (Cardiology) Lockie Mola, MD as Referring Physician (Ophthalmology) Sharon Seller, NP as Nurse Practitioner (Geriatric Medicine) Pa, [facility])  Extended Emergency Contact Information Primary Emergency Contact: [name] Address: [address]          [place], [place] [address] [name] of [place] Home Phone: [phone] Work Phone: [phone] Relation: Daughter  Code Status:  DNR Goals of care: Advanced Directive information    04/24/2023    4:34 PM  Advanced Directives  Does Patient Have a Medical Advance Directive? Yes  Type of Advance Directive Out of facility DNR (pink MOST or yellow form)  Does patient want to make changes to medical advance directive? No - Patient declined     Chief Complaint  Patient presents with   Medical Management of Chronic Issues    Medical Management of Chronic Issues.     HPI:  Pt is a 89 y.o. female seen today for medical management of chronic diseases.    Patient states she is doing well.   She had a recent visit with urology and gynecology for vaginal bleeding. She has history of hysterectomy and was confused about why she was bleeding.   She denies bleeding at this time. She is still adjusting to the new location and new people in the facility since [place].   Nursing without acute concern at this time.   Pharmacy review notes needed changes for diabetes. Goal to decrease medication burden and transition to insulin.   Past Medical History:  Diagnosis Date   Acute pancreatitis 07/24/2021   Basal cell carcinoma 03/30/2008   left upper arm/excision   Basal cell carcinoma 03/31/2008   left upper arm, right  lower leg   Basal cell carcinoma 01/18/2009   left upper arm   Collagenous colitis    Diabetes mellitus without complication (HCC)    Diverticulitis    Elevated LFTs 07/25/2021   Heart murmur    at birth, none since   History of echocardiogram    a. 11/2015: echo showing EF of 60-65% with no WMA. Grade 1 DD and mild MR noted.    Hyperlipidemia    Per [facility]   Hypertension    Intertrochanteric fracture of right femur (HCC) 06/11/2019   Lack of coordination    Per [facility] term current use of anticoagulant    Per [facility]   Noninfective gastroenteritis and colitis, unspecified    Per [facility]   PAF (paroxysmal atrial fibrillation) (HCC)    a. initially diagnosed in 08/2016 --> started on Eliquis   Squamous cell carcinoma of skin 05/16/2020   Left nasal tip anterior to ala, refer to [facility]   Syncope    a. initially occurring in Fall 2016 b. Hospitalized in 10/2015 and 11/2015 for recurrent episodes.    Past Surgical History:  Procedure Laterality Date   ABDOMINAL HYSTERECTOMY     BACK SURGERY  08/08/2008   Lumbar discectomy   COLONOSCOPY WITH PROPOFOL     EXCISION/RELEASE BURSA HIP Left 05/03/2019   Procedure: HIP ABDUCTOR REPAIR;  Surgeon: Kennedy Bucker, MD;  Location: [facility] ORS;  Service: Orthopedics;  Laterality: Left;   INTRAMEDULLARY (IM) NAIL INTERTROCHANTERIC Right 06/13/2019  Procedure: INTRAMEDULLARY (IM) NAIL INTERTROCHANTRIC;  Surgeon: Kennedy Bucker, MD;  Location: [facility] ORS;  Service: Orthopedics;  Laterality: Right;   NECK SURGERY  1980    Allergies  Allergen Reactions   Morphine Sulfate Other (See Comments)    BP bottoms out   Penicillins Diarrhea    Has patient had a PCN reaction causing immediate rash, facial/tongue/throat swelling, SOB or lightheadedness with hypotension: Yes Has patient had a PCN reaction causing severe rash involving mucus membranes or skin necrosis: Yes Has patient had a PCN reaction that required hospitalization No Has  patient had a PCN reaction occurring within the last 10 years: No If all of the above answers are "NO", then may proceed with Cephalosporin use.    Outpatient Encounter Medications as of 05/27/2023  Medication Sig   acetaminophen (TYLENOL) 325 MG tablet Take 650 mg by mouth 2 (two) times daily.   apixaban (ELIQUIS) 5 MG TABS tablet Take 1 tablet (5 mg total) by mouth 2 (two) times daily.   Cholecalciferol (VITAMIN D) 50 MCG (2000 UT) CAPS Give one capsule by mouth once daily .   Clobetasol Propionate 0.05 % shampoo Apply 1 Application topically every morning. And wash out. Mon,Wed,Fri   empagliflozin (JARDIANCE) 10 MG TABS tablet Take 1 tablet (10 mg total) by mouth daily before breakfast.   Ensure (ENSURE) Take 237 mLs by mouth as needed (For weight loss or decreased intake).   ezetimibe (ZETIA) 10 MG tablet Take 1 tablet (10 mg total) by mouth daily.   fluocinolone (SYNALAR) 0.01 % external solution Apply 1 Application topically at bedtime. Apply to scalp   furosemide (LASIX) 20 MG tablet Take 20 mg by mouth every other day. Give one tablet by mouth as needed.   Infant Care Products (DERMACLOUD EX) Apply to buttocks/periarea topically every shift   lisinopril (ZESTRIL) 20 MG tablet TAKE 1 TABLET BY MOUTH DAILY   metFORMIN (GLUCOPHAGE-XR) 500 MG 24 hr tablet Take 500 mg by mouth 2 (two) times daily with a meal.   metoprolol succinate (TOPROL-XL) 25 MG 24 hr tablet TAKE 1 TABLET BY MOUTH ONCE DAILY   Multiple Vitamin (MULTIVITAMIN WITH MINERALS) TABS tablet Take 1 tablet by mouth daily.   Wheat Dextrin (BENEFIBER PO) Take 1 each by mouth daily.   [DISCONTINUED] glipiZIDE (GLUCOTROL XL) 10 MG 24 hr tablet Take 10 mg by mouth 2 (two) times daily.   [DISCONTINUED] linagliptin (TRADJENTA) 5 MG TABS tablet Take 5 mg by mouth daily.   No facility-administered encounter medications on file as of 05/27/2023.    Review of Systems  Immunization History  Administered Date(s) Administered   Fluad  Quad(high Dose 65+) 12/20/2018   Influenza Split 01/01/2009, 01/02/2010, 01/13/2011, 12/22/2011   Influenza, High Dose Seasonal PF 01/18/2014, 01/01/2015, 11/30/2015, 12/01/2016, 01/11/2018, 12/08/2019, 12/26/2022   Influenza-Unspecified 01/08/2013, 11/08/2020, 12/25/2021   Moderna Covid-19 Fall Seasonal Vaccine 30yrs & older 06/17/2022, 12/05/2022   Moderna Covid-19 Vaccine Bivalent Booster 53yrs & up 11/30/2020, 08/06/2021, 01/17/2022   PFIZER Comirnaty(Gray Top)Covid-19 Tri-Sucrose Vaccine 07/27/2020   PFIZER(Purple Top)SARS-COV-2 Vaccination 04/01/2019, 04/22/2019, 12/05/2019, 07/27/2020   Pfizer Covid-19 Vaccine Bivalent Booster 17yrs & up 11/30/2020   Pneumococcal Conjugate-13 10/19/2013   Pneumococcal Polysaccharide-23 01/02/2010   Td 11/02/2003, 01/13/2011   Tdap 01/13/2011, 02/28/2015   Zoster Recombinant(Shingrix) 08/11/2022, 11/25/2022   Zoster, Live 07/14/2007   Pertinent  Health Maintenance Due  Topic Date Due   HEMOGLOBIN A1C  05/20/2023   FOOT EXAM  08/06/2023   OPHTHALMOLOGY EXAM  05/03/2024   INFLUENZA  VACCINE  Completed   DEXA SCAN  Completed      07/24/2021    4:09 PM 07/24/2021   10:50 PM 07/25/2021   10:02 AM 07/26/2021    1:00 AM 09/18/2022    9:32 AM  Fall Risk  Falls in the past year?     0  (RETIRED) Patient Fall Risk Level Moderate fall risk High fall risk High fall risk High fall risk    Functional Status Survey:    Vitals:   05/27/23 1013  BP: 128/80  Pulse: 72  Resp: 18  Temp: (!) 97.1 F (36.2 C)  SpO2: 97%  Weight: 159 lb 6.4 oz (72.3 kg)  Height: 5\' 1"  (1.549 m)   Body mass index is 30.12 kg/m. Physical Exam Constitutional:      Appearance: Normal appearance.  Cardiovascular:     Rate and Rhythm: Normal rate. Rhythm irregular.     Pulses: Normal pulses.  Pulmonary:     Effort: Pulmonary effort is normal.  Abdominal:     General: Abdomen is flat. Bowel sounds are normal.     Palpations: Abdomen is soft.  Musculoskeletal:         General: No swelling or tenderness.  Skin:    General: Skin is warm and dry.  Neurological:     General: No focal deficit present.     Mental Status: She is alert. Mental status is at baseline.  Psychiatric:        Mood and Affect: Mood normal.     Labs reviewed: Recent Labs    08/14/22 0000 11/20/22 0000 04/27/23 0000  NA 139 141 141  K 4.5 4.5 4.3  CL 105 104 109*  CO2 28* 30* 22  BUN 28* 32* 38*  CREATININE 0.9 1.0 1.1  CALCIUM 9.4 9.4 8.7   Recent Labs    08/07/22 0000 11/20/22 0000  AST 13 13  ALT 15 17  ALKPHOS 44 62  ALBUMIN 3.9 4.0   Recent Labs    08/07/22 0000 04/27/23 0000  WBC 6.6 6.1  NEUTROABS 2,191.00 1,830.00  HGB 12.8 12.4  HCT 38 37  PLT 267 237   Lab Results  Component Value Date   TSH 2.284 04/17/2022   Lab Results  Component Value Date   HGBA1C 7.5 11/20/2022   Lab Results  Component Value Date   CHOL 241 (A) 11/20/2022   HDL 43 11/20/2022   LDLCALC 165 11/20/2022   TRIG 179 (A) 11/20/2022   CHOLHDL 5.2 05/10/2022    Significant Diagnostic Results in last 30 days:  No results found.  Assessment/Plan Chronic heart failure with preserved ejection fraction (HFpEF) (HCC) - Plan: Do not attempt resuscitation (DNR)  Type 2 diabetes mellitus with other circulatory complication, without long-term current use of insulin (HCC) - Plan: Do not attempt resuscitation (DNR)  Essential hypertension - Plan: Do not attempt resuscitation (DNR)  Paroxysmal atrial fibrillation (HCC) - Plan: Do not attempt resuscitation (DNR)  CC (collagenous colitis) - Plan: Do not attempt resuscitation (DNR)  Urethral prolapse  History of hysterectomy Patient appears euvolemic at this time. Continue Jardiance, lasix, ACEi, metoprolol for CHF. Hx of diabetes, will order updated A1c at this time. In an effort to reduce pill burden, will start insulin 10 units nightly. FBG and HS glucose. Will stop glipizide and tradjenta at this time. Will consider  discontinuation of metformin- as this may contribute to chronic diarrhea as well. BP well-controlled. Follow up with urology for urethral prolapse.   Family/ staff  Communication: nursing  Labs/tests ordered:  A1c

## 2023-05-29 ENCOUNTER — Telehealth: Payer: Self-pay

## 2023-05-29 NOTE — Telephone Encounter (Signed)
 Copied from CRM [id number]. Topic: General - Other >> May 29, 2023  2:27 PM Dominique A wrote: Reason for CRM: Dr. Feliberto Gottron ([facility]) is calling back to speak with Dr. Sydnee Cabal regarding the patient. Please call Dr. Feliberto Gottron back at [phone].

## 2023-05-29 NOTE — Telephone Encounter (Signed)
Message routed to PCP Beamer, Victoria, MD  

## 2023-06-01 NOTE — Telephone Encounter (Signed)
 Noted.

## 2023-06-01 NOTE — Telephone Encounter (Signed)
 Returned phone call.

## 2023-06-04 ENCOUNTER — Encounter: Payer: Self-pay | Admitting: Student

## 2023-06-04 ENCOUNTER — Telehealth: Payer: Self-pay | Admitting: Urology

## 2023-06-05 ENCOUNTER — Encounter: Payer: Self-pay | Admitting: Urology

## 2023-06-05 NOTE — Telephone Encounter (Signed)
 Appt scheduled and left message pn voice mail about appt.

## 2023-06-07 NOTE — Telephone Encounter (Signed)
error 

## 2023-06-11 LAB — BASIC METABOLIC PANEL WITH GFR
BUN: 25 — AB (ref 4–21)
CO2: 27 — AB (ref 13–22)
Chloride: 106 (ref 99–108)
Creatinine: 1 (ref 0.5–1.1)
Glucose: 88
Potassium: 4.3 meq/L (ref 3.5–5.1)
Sodium: 141 (ref 137–147)

## 2023-06-11 LAB — COMPREHENSIVE METABOLIC PANEL WITH GFR
Calcium: 9 (ref 8.7–10.7)
eGFR: 55

## 2023-06-11 LAB — HEMOGLOBIN A1C: Hemoglobin A1C: 8.1

## 2023-06-16 ENCOUNTER — Telehealth: Payer: Self-pay | Admitting: Pharmacist

## 2023-06-16 DIAGNOSIS — E119 Type 2 diabetes mellitus without complications: Secondary | ICD-10-CM

## 2023-06-16 NOTE — Progress Notes (Addendum)
   06/16/2023  Patient ID: [name], female   DOB: 1931-09-04, 89 y.o.   MRN: [id number]  Pharmacy Quality Measure Review  This patient is appearing on a report for being at risk of failing the adherence measure for cholesterol (statin) medications this calendar year.  She is also on the "watch" for MAD  Medication: Atorvastatin   Last fill date: 09/2023 for  28 day supply  Atorvastatin was discontinued 10/2022 Patient should not be in the measure for 2025  Metformin XR 500 mg, Jardiance 10 mg, Tradjenta 5 mg-MAD  Tradjenta 5 mg was not on the patient's medication list-it appears to have been discontinued.  Sp[facility] who confirmed Jearld Lesch is still being filled --they fill weekly cycle packs for the patient. Tradjenta was last filled 06/10/23   A note will be sent to the Provider about Tradjenta.  Plan:    Will follow up in a week.    Beecher Mcardle, PharmD, BCACP Clinical Pharmacist [phone]

## 2023-06-23 ENCOUNTER — Encounter: Payer: Self-pay | Admitting: Nurse Practitioner

## 2023-06-23 ENCOUNTER — Non-Acute Institutional Stay (SKILLED_NURSING_FACILITY): Payer: Self-pay | Admitting: Nurse Practitioner

## 2023-06-23 DIAGNOSIS — K52831 Collagenous colitis: Secondary | ICD-10-CM | POA: Diagnosis not present

## 2023-06-23 DIAGNOSIS — E1159 Type 2 diabetes mellitus with other circulatory complications: Secondary | ICD-10-CM

## 2023-06-23 DIAGNOSIS — I5032 Chronic diastolic (congestive) heart failure: Secondary | ICD-10-CM

## 2023-06-23 DIAGNOSIS — E1169 Type 2 diabetes mellitus with other specified complication: Secondary | ICD-10-CM

## 2023-06-23 DIAGNOSIS — I10 Essential (primary) hypertension: Secondary | ICD-10-CM

## 2023-06-23 DIAGNOSIS — D692 Other nonthrombocytopenic purpura: Secondary | ICD-10-CM

## 2023-06-23 DIAGNOSIS — E785 Hyperlipidemia, unspecified: Secondary | ICD-10-CM

## 2023-06-23 DIAGNOSIS — I480 Paroxysmal atrial fibrillation: Secondary | ICD-10-CM | POA: Diagnosis not present

## 2023-06-23 DIAGNOSIS — M1990 Unspecified osteoarthritis, unspecified site: Secondary | ICD-10-CM

## 2023-06-23 DIAGNOSIS — N939 Abnormal uterine and vaginal bleeding, unspecified: Secondary | ICD-10-CM

## 2023-06-23 NOTE — Progress Notes (Unsigned)
 Location:  Other [facility].  Nursing Home Room Number: [facility] [address]A Place of Service:  SNF [phone]) Abbey Chatters, NP  PCP: [name], MD  Patient Care Team: [name], MD as PCP - General (Family Medicine) End, Cristal Deer, MD as PCP - Cardiology (Cardiology) Lockie Mola, MD as Referring Physician (Ophthalmology) Sharon Seller, NP as Nurse Practitioner (Geriatric Medicine) Pa, [facility])  Extended Emergency Contact Information Primary Emergency Contact: [name] Address: [address]          [place], [place] [address] [name] of [place] Home Phone: [phone] Work Phone: [phone] Relation: Daughter  Goals of care: Advanced Directive information    04/24/2023    4:34 PM  Advanced Directives  Does Patient Have a Medical Advance Directive? Yes  Type of Advance Directive Out of facility DNR (pink MOST or yellow form)  Does patient want to make changes to medical advance directive? No - Patient declined     Chief Complaint  Patient presents with   Medical Management of Chronic Issues    Medical Management of Chronic Issues.     HPI:  Pt is a 89 y.o. female seen today for medical management of chronic disease.  She reports she is doing well. Has no compliants today  She has hx of vaginal bleeding which has subsided   Her eliquis was previously on hold but currently getting at this time. No reports of bleeding noted.    Her A1c was elevated at 8.1 and she was transitioned to insulin however she reports that is one thing she will not do. She never wants to take insulin. She has decided that and plans to stick by it.  She was placed back on oral medication and continues on jardiance, glipizide, tradjenta and metformin. Fasting blood sugars ranging from 140-200s. She reports she is trying to be more mindful of her sugar intake.  She is aware of the adverse effects of elevated blood sugars over a prolong time.    Blood pressures have been well controlled.   No signs of fluid overload, no shortness of breath, chest pains.   Bowels moving well, no changes in BMs  Past Medical History:  Diagnosis Date   Acute pancreatitis 07/24/2021   Basal cell carcinoma 03/30/2008   left upper arm/excision   Basal cell carcinoma 03/31/2008   left upper arm, right lower leg   Basal cell carcinoma 01/18/2009   left upper arm   Collagenous colitis    Diabetes mellitus without complication (HCC)    Diverticulitis    Elevated LFTs 07/25/2021   Heart murmur    at birth, none since   History of echocardiogram    a. 11/2015: echo showing EF of 60-65% with no WMA. Grade 1 DD and mild MR noted.    Hyperlipidemia    Per [facility]   Hypertension    Intertrochanteric fracture of right femur (HCC) 06/11/2019   Lack of coordination    Per [facility] term current use of anticoagulant    Per [facility]   Noninfective gastroenteritis and colitis, unspecified    Per [facility]   PAF (paroxysmal atrial fibrillation) (HCC)    a. initially diagnosed in 08/2016 --> started on Eliquis   Squamous cell carcinoma of skin 05/16/2020   Left nasal tip anterior to ala, refer to [facility]   Syncope    a. initially occurring in Fall 2016 b. Hospitalized in 10/2015 and 11/2015 for recurrent episodes.  Past Surgical History:  Procedure Laterality Date   ABDOMINAL HYSTERECTOMY     BACK SURGERY  08/08/2008   Lumbar discectomy   COLONOSCOPY WITH PROPOFOL     EXCISION/RELEASE BURSA HIP Left 05/03/2019   Procedure: HIP ABDUCTOR REPAIR;  Surgeon: Kennedy Bucker, MD;  Location: [facility] ORS;  Service: Orthopedics;  Laterality: Left;   INTRAMEDULLARY (IM) NAIL INTERTROCHANTERIC Right 06/13/2019   Procedure: INTRAMEDULLARY (IM) NAIL INTERTROCHANTRIC;  Surgeon: Kennedy Bucker, MD;  Location: [facility] ORS;  Service: Orthopedics;  Laterality: Right;   NECK SURGERY  1980    Allergies  Allergen Reactions   Morphine Sulfate Other (See  Comments)    BP bottoms out   Penicillins Diarrhea    Has patient had a PCN reaction causing immediate rash, facial/tongue/throat swelling, SOB or lightheadedness with hypotension: Yes Has patient had a PCN reaction causing severe rash involving mucus membranes or skin necrosis: Yes Has patient had a PCN reaction that required hospitalization No Has patient had a PCN reaction occurring within the last 10 years: No If all of the above answers are "NO", then Valeta Paz proceed with Cephalosporin use.    Outpatient Encounter Medications as of 06/23/2023  Medication Sig   acetaminophen (TYLENOL) 325 MG tablet Take 650 mg by mouth 2 (two) times daily.   apixaban (ELIQUIS) 5 MG TABS tablet Take 1 tablet (5 mg total) by mouth 2 (two) times daily.   bismuth subsalicylate (PEPTO BISMOL) 262 MG/15ML suspension Take 30 mLs by mouth every 4 (four) hours as needed.   Cholecalciferol (VITAMIN D) 50 MCG (2000 UT) CAPS Give one capsule by mouth once daily .   Clobetasol Propionate 0.05 % shampoo Apply 1 Application topically every morning. And wash out. Tues,Fri   empagliflozin (JARDIANCE) 10 MG TABS tablet Take 1 tablet (10 mg total) by mouth daily before breakfast.   Ensure (ENSURE) Take 237 mLs by mouth as needed (For weight loss or decreased intake).   ezetimibe (ZETIA) 10 MG tablet Take 1 tablet (10 mg total) by mouth daily.   fluocinolone (SYNALAR) 0.01 % external solution Apply 1 Application topically at bedtime. Apply to scalp   furosemide (LASIX) 20 MG tablet Take 20 mg by mouth daily. Give one by mouth every other day. Give one tablet by mouth every 24 hours as needed if 3lbs or greater weight gain in 24hrs.   glipiZIDE (GLUCOTROL) 10 MG tablet Take 10 mg by mouth 2 (two) times daily before a meal.   Infant Care Products (DERMACLOUD EX) Apply to buttocks/periarea topically every shift   linagliptin (TRADJENTA) 5 MG TABS tablet Take 5 mg by mouth daily.   lisinopril (ZESTRIL) 20 MG tablet TAKE 1 TABLET BY  MOUTH DAILY   metFORMIN (GLUCOPHAGE-XR) 500 MG 24 hr tablet Take 1,000 mg by mouth 2 (two) times daily with a meal.   metoprolol succinate (TOPROL-XL) 25 MG 24 hr tablet TAKE 1 TABLET BY MOUTH ONCE DAILY   Multiple Vitamin (MULTIVITAMIN WITH MINERALS) TABS tablet Take 1 tablet by mouth daily.   Wheat Dextrin (BENEFIBER PO) Take 1 each by mouth daily.   No facility-administered encounter medications on file as of 06/23/2023.    Review of Systems  Constitutional:  Negative for activity change, appetite change, fatigue and unexpected weight change.  HENT:  Negative for congestion and hearing loss.   Eyes: Negative.   Respiratory:  Negative for cough and shortness of breath.   Cardiovascular:  Negative for chest pain, palpitations and leg swelling.  Gastrointestinal:  Negative for abdominal  pain, constipation and diarrhea.  Genitourinary:  Negative for difficulty urinating and dysuria.  Musculoskeletal:  Negative for arthralgias and myalgias.  Skin:  Negative for color change and wound.  Neurological:  Negative for dizziness and weakness.  Psychiatric/Behavioral:  Negative for agitation, behavioral problems and confusion.      Immunization History  Administered Date(s) Administered   Fluad Quad(high Dose 65+) 12/20/2018   Influenza Split 01/01/2009, 01/02/2010, 01/13/2011, 12/22/2011   Influenza, High Dose Seasonal PF 01/18/2014, 01/01/2015, 11/30/2015, 12/01/2016, 01/11/2018, 12/08/2019, 12/26/2022   Influenza-Unspecified 01/08/2013, 11/08/2020, 12/25/2021   Moderna Covid-19 Fall Seasonal Vaccine 20yrs & older 06/17/2022, 12/05/2022   Moderna Covid-19 Vaccine Bivalent Booster 46yrs & up 11/30/2020, 08/06/2021, 01/17/2022   PFIZER Comirnaty(Gray Top)Covid-19 Tri-Sucrose Vaccine 07/27/2020   PFIZER(Purple Top)SARS-COV-2 Vaccination 04/01/2019, 04/22/2019, 12/05/2019, 07/27/2020   Pfizer Covid-19 Vaccine Bivalent Booster 48yrs & up 11/30/2020   Pneumococcal Conjugate-13 10/19/2013    Pneumococcal Polysaccharide-23 01/02/2010   Td 11/02/2003, 01/13/2011   Tdap 01/13/2011, 02/28/2015   Zoster Recombinant(Shingrix) 08/11/2022, 11/25/2022   Zoster, Live 07/14/2007   Pertinent  Health Maintenance Due  Topic Date Due   FOOT EXAM  08/06/2023   INFLUENZA VACCINE  10/09/2023   HEMOGLOBIN A1C  12/11/2023   OPHTHALMOLOGY EXAM  05/03/2024   DEXA SCAN  Completed      07/24/2021    4:09 PM 07/24/2021   10:50 PM 07/25/2021   10:02 AM 07/26/2021    1:00 AM 09/18/2022    9:32 AM  Fall Risk  Falls in the past year?     0  (RETIRED) Patient Fall Risk Level Moderate fall risk High fall risk High fall risk High fall risk    Functional Status Survey:    Vitals:   06/23/23 1112  BP: (!) 94/56  Pulse: 65  Resp: 20  Temp: 97.6 F (36.4 C)  SpO2: 97%  Weight: 156 lb 4.8 oz (70.9 kg)  Height: 5\' 1"  (1.549 m)   Body mass index is 29.53 kg/m. Physical Exam Constitutional:      General: She is not in acute distress.    Appearance: She is well-developed. She is not diaphoretic.  HENT:     Head: Normocephalic and atraumatic.     Mouth/Throat:     Pharynx: No oropharyngeal exudate.  Eyes:     Conjunctiva/sclera: Conjunctivae normal.     Pupils: Pupils are equal, round, and reactive to light.  Cardiovascular:     Rate and Rhythm: Normal rate and regular rhythm.     Heart sounds: Normal heart sounds.  Pulmonary:     Effort: Pulmonary effort is normal.     Breath sounds: Normal breath sounds.  Abdominal:     General: Bowel sounds are normal.     Palpations: Abdomen is soft.  Musculoskeletal:     Cervical back: Normal range of motion and neck supple.     Right lower leg: No edema.     Left lower leg: No edema.  Skin:    General: Skin is warm and dry.  Neurological:     Mental Status: She is alert.  Psychiatric:        Mood and Affect: Mood normal.     Labs reviewed: Recent Labs    11/20/22 0000 04/27/23 0000 06/11/23 0000  NA 141 141 141  K 4.5 4.3 4.3  CL  104 109* 106  CO2 30* 22 27*  BUN 32* 38* 25*  CREATININE 1.0 1.1 1.0  CALCIUM 9.4 8.7 9.0   Recent Labs  08/07/22 0000 11/20/22 0000  AST 13 13  ALT 15 17  ALKPHOS 44 62  ALBUMIN 3.9 4.0   Recent Labs    08/07/22 0000 04/27/23 0000  WBC 6.6 6.1  NEUTROABS 2,191.00 1,830.00  HGB 12.8 12.4  HCT 38 37  PLT 267 237   Lab Results  Component Value Date   TSH 2.284 04/17/2022   Lab Results  Component Value Date   HGBA1C 8.1 06/11/2023   Lab Results  Component Value Date   CHOL 241 (A) 11/20/2022   HDL 43 11/20/2022   LDLCALC 165 11/20/2022   TRIG 179 (A) 11/20/2022   CHOLHDL 5.2 05/10/2022    Significant Diagnostic Results in last 30 days:  No results found.  Assessment/Plan CC (collagenous colitis) Stable at this time, continue current regimen  Diabetes mellitus with circulatory complication, without long-term current use of insulin (HCC) Does not wish to start insulin despite uncontrolled blood sugars. Continue current regimen with dietary modifications. Aware of adverse effects of uncontrolled blood sugars.   Paroxysmal atrial fibrillation (HCC) Rate controlled on metoprolol   Essential hypertension Blood pressure well controlled, goal bp <140/90 Continue current medications and dietary modifications follow metabolic panel   Chronic heart failure with preserved ejection fraction (HFpEF) (HCC) Euvolemic at this time, continue lasix and metoprolol   Hyperlipidemia associated with type 2 diabetes mellitus (HCC) Continues on zetia.   Senile purpura (HCC) Monitor at this time  Arthritis Stable on tylenol BID, no reports of worsening pain.      Jessica K. Denney Fisherman [facility] [phone]

## 2023-06-24 NOTE — Assessment & Plan Note (Signed)
 Stable on tylenol BID, no reports of worsening pain.

## 2023-06-24 NOTE — Assessment & Plan Note (Signed)
 Monitor at this time

## 2023-06-24 NOTE — Assessment & Plan Note (Signed)
 Does not wish to start insulin despite uncontrolled blood sugars. Continue current regimen with dietary modifications. Aware of adverse effects of uncontrolled blood sugars.

## 2023-06-24 NOTE — Assessment & Plan Note (Signed)
 Stable at this time, continue current regimen.

## 2023-06-24 NOTE — Assessment & Plan Note (Signed)
 Euvolemic at this time, continue lasix and metoprolol

## 2023-06-24 NOTE — Assessment & Plan Note (Signed)
Rate controlled on metoprolol.  °

## 2023-06-24 NOTE — Assessment & Plan Note (Signed)
Continues on zetia

## 2023-06-24 NOTE — Assessment & Plan Note (Signed)
 Blood pressure well controlled, goal bp <140/90 Continue current medications and dietary modifications follow metabolic panel

## 2023-07-24 ENCOUNTER — Ambulatory Visit: Admitting: Physician Assistant

## 2023-07-29 ENCOUNTER — Non-Acute Institutional Stay (SKILLED_NURSING_FACILITY): Admitting: Student

## 2023-07-29 ENCOUNTER — Encounter: Payer: Self-pay | Admitting: Student

## 2023-07-29 DIAGNOSIS — E1159 Type 2 diabetes mellitus with other circulatory complications: Secondary | ICD-10-CM

## 2023-07-29 DIAGNOSIS — I5032 Chronic diastolic (congestive) heart failure: Secondary | ICD-10-CM

## 2023-07-29 DIAGNOSIS — I480 Paroxysmal atrial fibrillation: Secondary | ICD-10-CM

## 2023-07-29 DIAGNOSIS — J432 Centrilobular emphysema: Secondary | ICD-10-CM

## 2023-07-29 DIAGNOSIS — I700 Atherosclerosis of aorta: Secondary | ICD-10-CM | POA: Diagnosis not present

## 2023-07-29 DIAGNOSIS — I10 Essential (primary) hypertension: Secondary | ICD-10-CM

## 2023-07-29 DIAGNOSIS — K52831 Collagenous colitis: Secondary | ICD-10-CM

## 2023-07-29 DIAGNOSIS — Z7901 Long term (current) use of anticoagulants: Secondary | ICD-10-CM

## 2023-07-29 NOTE — Progress Notes (Unsigned)
 Location:  Other [facility].  Nursing Home Room Number: [facility] 517A Place of Service:  SNF [phone]) Provider:  Valrie Gehrig, MD  Patient Care Team: Valrie Gehrig, MD as PCP - General (Family Medicine) End, Veryl Gottron, MD as PCP - Cardiology (Cardiology) Annell Kidney, MD as Referring Physician (Ophthalmology) Verma Gobble, NP as Nurse Practitioner (Geriatric Medicine) Pa, [facility])  Extended Emergency Contact Information Primary Emergency Contact: Gorman Laughter Address: [address]          [place], [place] [address] [place] Home Phone: [phone] Work Phone: [phone] Relation: Daughter  Code Status:  DNR Goals of care: Advanced Directive information    07/29/2023   11:13 AM  Advanced Directives  Does Patient Have a Medical Advance Directive? Yes  Type of Advance Directive Out of facility DNR (pink MOST or yellow form)  Does patient want to make changes to medical advance directive? No - Patient declined     Chief Complaint  Patient presents with  . Medical Management of Chronic Issues    Medical Management of Chronic Issues.     HPI:  Pt is a 89 y.o. female seen today for medical management of chronic diseases.   Patient states she is doing well. She is adjusting to the facility well. She doesn't have any concerns at this time. Denies shortness of breath or chest pain. She is eating well. Diarrhea is not as much of a problem anymore. She enjoys activities and continues to enjoy casinos when possible with family.   Past Medical History:  Diagnosis Date  . Acute pancreatitis 07/24/2021  . Basal cell carcinoma 03/30/2008   left upper arm/excision  . Basal cell carcinoma 03/31/2008   left upper arm, right lower leg  . Basal cell carcinoma 01/18/2009   left upper arm  . Collagenous colitis   . Diabetes mellitus without complication (HCC)   . Diverticulitis   . Elevated LFTs 07/25/2021  . Heart murmur     at birth, none since  . History of echocardiogram    a. 11/2015: echo showing EF of 60-65% with no WMA. Grade 1 DD and mild MR noted.   . Hyperlipidemia    Per [facility]  . Hypertension   . Intertrochanteric fracture of right femur (HCC) 06/11/2019  . Lack of coordination    Per [facility]  . Long term current use of anticoagulant    Per [facility]  . Noninfective gastroenteritis and colitis, unspecified    Per [facility]  . PAF (paroxysmal atrial fibrillation) (HCC)    a. initially diagnosed in 08/2016 --> started on Eliquis   . Squamous cell carcinoma of skin 05/16/2020   Left nasal tip anterior to ala, refer to [facility]  . Syncope    a. initially occurring in Fall 2016 b. Hospitalized in 10/2015 and 11/2015 for recurrent episodes.    Past Surgical History:  Procedure Laterality Date  . ABDOMINAL HYSTERECTOMY    . BACK SURGERY  08/08/2008   Lumbar discectomy  . COLONOSCOPY WITH PROPOFOL     . EXCISION/RELEASE BURSA HIP Left 05/03/2019   Procedure: HIP ABDUCTOR REPAIR;  Surgeon: Molli Angelucci, MD;  Location: [facility] ORS;  Service: Orthopedics;  Laterality: Left;  . INTRAMEDULLARY (IM) NAIL INTERTROCHANTERIC Right 06/13/2019   Procedure: INTRAMEDULLARY (IM) NAIL INTERTROCHANTRIC;  Surgeon: Molli Angelucci, MD;  Location: [facility] ORS;  Service: Orthopedics;  Laterality: Right;  . NECK SURGERY  1980    Allergies  Allergen Reactions  . Morphine  Sulfate  Other (See Comments)    BP bottoms out  . Penicillins Diarrhea    Has patient had a PCN reaction causing immediate rash, facial/tongue/throat swelling, SOB or lightheadedness with hypotension: Yes Has patient had a PCN reaction causing severe rash involving mucus membranes or skin necrosis: Yes Has patient had a PCN reaction that required hospitalization No Has patient had a PCN reaction occurring within the last 10 years: No If all of the above answers are "NO", then may proceed with Cephalosporin use.    Outpatient Encounter Medications  as of 07/29/2023  Medication Sig  . acetaminophen  (TYLENOL ) 325 MG tablet Take 650 mg by mouth 2 (two) times daily. Give 650mg  by mouth every 8 hours as needed  . apixaban  (ELIQUIS ) 5 MG TABS tablet Take 1 tablet (5 mg total) by mouth 2 (two) times daily.  Aaron Aas bismuth subsalicylate (PEPTO BISMOL) 262 MG/15ML suspension Take 30 mLs by mouth every 4 (four) hours as needed.  . Cholecalciferol  (VITAMIN D ) 50 MCG (2000 UT) CAPS Give one capsule by mouth once daily .  . Clobetasol  Propionate 0.05 % shampoo Apply 1 Application topically every morning. And wash out. Tues,Fri  . empagliflozin  (JARDIANCE ) 10 MG TABS tablet Take 1 tablet (10 mg total) by mouth daily before breakfast.  . Ensure (ENSURE) Take 237 mLs by mouth as needed (For weight loss or decreased intake).  . ezetimibe  (ZETIA ) 10 MG tablet Take 1 tablet (10 mg total) by mouth daily.  . fluocinolone  (SYNALAR ) 0.01 % external solution Apply 1 Application topically at bedtime. Apply to scalp  . furosemide  (LASIX ) 20 MG tablet Take 20 mg by mouth daily. Give one tablet by mouth every 24 hours as needed if 3lbs or greater weight gain in 24hrs.  . glipiZIDE  (GLUCOTROL ) 10 MG tablet Take 10 mg by mouth 2 (two) times daily before a meal.  . Infant Care Products (DERMACLOUD EX) Apply to buttocks/periarea topically every shift  . linagliptin  (TRADJENTA ) 5 MG TABS tablet Take 5 mg by mouth daily.  . lisinopril  (ZESTRIL ) 20 MG tablet TAKE 1 TABLET BY MOUTH DAILY (Patient taking differently: Take 10 mg by mouth daily.)  . metFORMIN  (GLUCOPHAGE -XR) 500 MG 24 hr tablet Take 1,000 mg by mouth 2 (two) times daily with a meal.  . metoprolol  succinate (TOPROL -XL) 25 MG 24 hr tablet TAKE 1 TABLET BY MOUTH ONCE DAILY  . Multiple Vitamin (MULTIVITAMIN WITH MINERALS) TABS tablet Take 1 tablet by mouth daily.  . Wheat Dextrin (BENEFIBER PO) Take 1 each by mouth daily.   No facility-administered encounter medications on file as of 07/29/2023.    Review of  Systems  Immunization History  Administered Date(s) Administered  . Fluad Quad(high Dose 65+) 12/20/2018  . Influenza Split 01/01/2009, 01/02/2010, 01/13/2011, 12/22/2011  . Influenza, High Dose Seasonal PF 01/18/2014, 01/01/2015, 11/30/2015, 12/01/2016, 01/11/2018, 12/08/2019, 12/26/2022  . Influenza-Unspecified 01/08/2013, 11/08/2020, 12/25/2021  . Moderna Covid-19 Fall Seasonal Vaccine 31yrs & older 06/17/2022, 12/05/2022  . Moderna Covid-19 Vaccine Bivalent Booster 69yrs & up 11/30/2020, 08/06/2021, 01/17/2022  . PFIZER Comirnaty(Gray Top)Covid-19 Tri-Sucrose Vaccine 07/27/2020  . PFIZER(Purple Top)SARS-COV-2 Vaccination 04/01/2019, 04/22/2019, 12/05/2019, 07/27/2020  . Pfizer Covid-19 Vaccine Bivalent Booster 62yrs & up 11/30/2020  . Pneumococcal Conjugate-13 10/19/2013  . Pneumococcal Polysaccharide-23 01/02/2010  . Td 11/02/2003, 01/13/2011  . Tdap 01/13/2011, 02/28/2015  . Zoster Recombinant(Shingrix) 08/11/2022, 11/25/2022  . Zoster, Live 07/14/2007   Pertinent  Health Maintenance Due  Topic Date Due  . FOOT EXAM  08/06/2023  . INFLUENZA VACCINE  10/09/2023  .  HEMOGLOBIN A1C  12/11/2023  . OPHTHALMOLOGY EXAM  05/03/2024  . DEXA SCAN  Completed      07/24/2021    4:09 PM 07/24/2021   10:50 PM 07/25/2021   10:02 AM 07/26/2021    1:00 AM 09/18/2022    9:32 AM  Fall Risk  Falls in the past year?     0  (RETIRED) Patient Fall Risk Level Moderate fall risk High fall risk High fall risk High fall risk    Functional Status Survey:    Vitals:   07/29/23 1105  BP: 101/62  Pulse: 76  Resp: 20  Temp: 97.6 F (36.4 C)  SpO2: 97%  Weight: 159 lb (72.1 kg)  Height: 5\' 1"  (1.549 m)   Body mass index is 30.04 kg/m. Physical Exam Constitutional:      Appearance: Normal appearance.  Cardiovascular:     Rate and Rhythm: Normal rate and regular rhythm.     Pulses: Normal pulses.     Heart sounds: Normal heart sounds.  Pulmonary:     Effort: Pulmonary effort is normal.   Abdominal:     General: Abdomen is flat. Bowel sounds are normal.     Palpations: Abdomen is soft.  Musculoskeletal:        General: No swelling or tenderness.  Skin:    General: Skin is warm and dry.  Neurological:     Mental Status: She is alert.     Gait: Gait abnormal.     Comments: Oriented to self and place  Psychiatric:        Mood and Affect: Mood normal.    Labs reviewed: Recent Labs    11/20/22 0000 04/27/23 0000 06/11/23 0000  NA 141 141 141  K 4.5 4.3 4.3  CL 104 109* 106  CO2 30* 22 27*  BUN 32* 38* 25*  CREATININE 1.0 1.1 1.0  CALCIUM  9.4 8.7 9.0   Recent Labs    08/07/22 0000 11/20/22 0000  AST 13 13  ALT 15 17  ALKPHOS 44 62  ALBUMIN 3.9 4.0   Recent Labs    08/07/22 0000 04/27/23 0000  WBC 6.6 6.1  NEUTROABS 2,191.00 1,830.00  HGB 12.8 12.4  HCT 38 37  PLT 267 237   Lab Results  Component Value Date   TSH 2.284 04/17/2022   Lab Results  Component Value Date   HGBA1C 8.1 06/11/2023   Lab Results  Component Value Date   CHOL 241 (A) 11/20/2022   HDL 43 11/20/2022   LDLCALC 165 11/20/2022   TRIG 179 (A) 11/20/2022   CHOLHDL 5.2 05/10/2022    Significant Diagnostic Results in last 30 days:  No results found.  Assessment/Plan There are no diagnoses linked to this encounter.   Family/ staff Communication: ***  Labs/tests ordered:  ***

## 2023-08-04 ENCOUNTER — Encounter: Payer: Self-pay | Admitting: Student

## 2023-08-26 LAB — HM DIABETES FOOT EXAM

## 2023-10-08 ENCOUNTER — Non-Acute Institutional Stay (SKILLED_NURSING_FACILITY): Admitting: Nurse Practitioner

## 2023-10-08 ENCOUNTER — Encounter: Payer: Self-pay | Admitting: Nurse Practitioner

## 2023-10-08 DIAGNOSIS — E1159 Type 2 diabetes mellitus with other circulatory complications: Secondary | ICD-10-CM | POA: Diagnosis not present

## 2023-10-08 DIAGNOSIS — E1169 Type 2 diabetes mellitus with other specified complication: Secondary | ICD-10-CM

## 2023-10-08 DIAGNOSIS — I5032 Chronic diastolic (congestive) heart failure: Secondary | ICD-10-CM

## 2023-10-08 DIAGNOSIS — I10 Essential (primary) hypertension: Secondary | ICD-10-CM

## 2023-10-08 DIAGNOSIS — I700 Atherosclerosis of aorta: Secondary | ICD-10-CM | POA: Diagnosis not present

## 2023-10-08 DIAGNOSIS — N939 Abnormal uterine and vaginal bleeding, unspecified: Secondary | ICD-10-CM

## 2023-10-08 DIAGNOSIS — J432 Centrilobular emphysema: Secondary | ICD-10-CM

## 2023-10-08 DIAGNOSIS — I480 Paroxysmal atrial fibrillation: Secondary | ICD-10-CM

## 2023-10-08 DIAGNOSIS — E785 Hyperlipidemia, unspecified: Secondary | ICD-10-CM

## 2023-10-08 DIAGNOSIS — K52831 Collagenous colitis: Secondary | ICD-10-CM

## 2023-10-08 NOTE — Assessment & Plan Note (Signed)
 Stable at this time, no recent exacerbations

## 2023-10-08 NOTE — Assessment & Plan Note (Signed)
 Continue current regimen, A1c not at goal but does not wish to take insulin  Encouraged dietary compliance, routine foot care/monitoring and to keep up with diabetic eye exams through ophthalmology

## 2023-10-08 NOTE — Assessment & Plan Note (Signed)
 Rate controlled on metoprolol , continues on eliquis 

## 2023-10-08 NOTE — Assessment & Plan Note (Signed)
Continues on zetia

## 2023-10-08 NOTE — Assessment & Plan Note (Signed)
 Euvolemic at this time, continue lasix and metoprolol

## 2023-10-08 NOTE — Assessment & Plan Note (Signed)
 Stable at this time, continue current regimen.

## 2023-10-08 NOTE — Assessment & Plan Note (Signed)
 Blood pressure well controlled, goal bp <140/90 Continue current medications and dietary modifications follow metabolic panel

## 2023-10-08 NOTE — Assessment & Plan Note (Addendum)
 Continues on eliquis  Continues on zetia 

## 2023-10-08 NOTE — Progress Notes (Signed)
 Location:  Other Nursing Home Room Number: 517-A Place of Service:  SNF (31)  Abdul Fine, MD  Patient Care Team: Abdul Fine, MD as PCP - General (Family Medicine) End, Lonni, MD as PCP - Cardiology (Cardiology) Mittie Gaskin, MD as Referring Physician (Ophthalmology) Caro Harlene POUR, NP as Nurse Practitioner (Geriatric Medicine) Pa, [facility])  Extended Emergency Contact Information Primary Emergency Contact: Bridgette Barnie DEL Address: [address]          [place], [place] [address] [place] Home Phone: [phone] Work Phone: [phone] Relation: Daughter  Goals of care: Advanced Directive information    10/08/2023    8:36 AM  Advanced Directives  Does Patient Have a Medical Advance Directive? Yes  Type of Advance Directive Out of facility DNR (pink MOST or yellow form)  Does patient want to make changes to medical advance directive? No - Patient declined     Chief Complaint  Patient presents with   Follow-up    Routine follow -up    HPI:  Pt is a 89 y.o. female seen today for medical management of chronic disease. Pt with hx of CHF, DM, A fib, emphysema, htn, and others  Hx of vaginal bleeding noted back in February had not had any issues with this except today noted small amount of blood when she wiped. None in brief or the toilet.   Reports she has had some sinus congestion, no cough or chest congestion. URI has been going around so she has stayed in her room to prevent spreading.  No fevers or chills.    She does not wish to have insulin  despite A1c elevation, continues on glipizide , tradjenta , metformin ,and jardiance .   Denies chest pains or palpitations.   Reports mood is good.   Past Medical History:  Diagnosis Date   Acute pancreatitis 07/24/2021   Basal cell carcinoma 03/30/2008   left upper arm/excision   Basal cell carcinoma 03/31/2008   left upper arm, right lower leg   Basal cell  carcinoma 01/18/2009   left upper arm   Collagenous colitis    Diabetes mellitus without complication (HCC)    Diverticulitis    Elevated LFTs 07/25/2021   Heart murmur    at birth, none since   History of echocardiogram    a. 11/2015: echo showing EF of 60-65% with no WMA. Grade 1 DD and mild MR noted.    Hyperlipidemia    Per [facility]   Hypertension    Intertrochanteric fracture of right femur (HCC) 06/11/2019   Lack of coordination    Per [facility] term current use of anticoagulant    Per [facility]   Noninfective gastroenteritis and colitis, unspecified    Per [facility]   PAF (paroxysmal atrial fibrillation) (HCC)    a. initially diagnosed in 08/2016 --> started on Eliquis    Squamous cell carcinoma of skin 05/16/2020   Left nasal tip anterior to ala, refer to [facility]   Syncope    a. initially occurring in Fall 2016 b. Hospitalized in 10/2015 and 11/2015 for recurrent episodes.    Past Surgical History:  Procedure Laterality Date   ABDOMINAL HYSTERECTOMY     BACK SURGERY  08/08/2008   Lumbar discectomy   COLONOSCOPY WITH PROPOFOL      EXCISION/RELEASE BURSA HIP Left 05/03/2019   Procedure: HIP ABDUCTOR REPAIR;  Surgeon: Kathlynn Sharper, MD;  Location: [facility] ORS;  Service: Orthopedics;  Laterality: Left;   INTRAMEDULLARY (IM) NAIL INTERTROCHANTERIC Right 06/13/2019  Procedure: INTRAMEDULLARY (IM) NAIL INTERTROCHANTRIC;  Surgeon: Kathlynn Sharper, MD;  Location: [facility] ORS;  Service: Orthopedics;  Laterality: Right;   NECK SURGERY  1980    Allergies  Allergen Reactions   Morphine  Sulfate Other (See Comments)    BP bottoms out   Penicillins Diarrhea    Has patient had a PCN reaction causing immediate rash, facial/tongue/throat swelling, SOB or lightheadedness with hypotension: Yes Has patient had a PCN reaction causing severe rash involving mucus membranes or skin necrosis: Yes Has patient had a PCN reaction that required hospitalization No Has patient had a PCN reaction  occurring within the last 10 years: No If all of the above answers are NO, then may proceed with Cephalosporin use.    Outpatient Encounter Medications as of 10/08/2023  Medication Sig   acetaminophen  (TYLENOL ) 325 MG tablet Take 650 mg by mouth 2 (two) times daily. Give 650mg  by mouth every 8 hours as needed   apixaban  (ELIQUIS ) 5 MG TABS tablet Take 1 tablet (5 mg total) by mouth 2 (two) times daily.   ARTIFICIAL TEAR SOLUTION OP Apply 1 drop to eye 4 (four) times daily.   bismuth subsalicylate (PEPTO BISMOL) 262 MG/15ML suspension Take 30 mLs by mouth every 4 (four) hours as needed.   Cholecalciferol  (VITAMIN D ) 50 MCG (2000 UT) CAPS Give one capsule by mouth once daily .   Clobetasol  Propionate 0.05 % shampoo Apply 1 Application topically every morning. And wash out. Tues,Fri   empagliflozin  (JARDIANCE ) 10 MG TABS tablet Take 1 tablet (10 mg total) by mouth daily before breakfast.   Ensure (ENSURE) Take 237 mLs by mouth as needed (For weight loss or decreased intake).   ezetimibe  (ZETIA ) 10 MG tablet Take 1 tablet (10 mg total) by mouth daily.   fluocinolone  (SYNALAR ) 0.01 % external solution Apply 1 Application topically at bedtime. Apply to scalp   furosemide  (LASIX ) 20 MG tablet Take 20 mg by mouth daily. Give one tablet by mouth every 24 hours as needed if 3lbs or greater weight gain in 24hrs.   glipiZIDE  (GLUCOTROL ) 10 MG tablet Take 10 mg by mouth 2 (two) times daily before a meal.   Guaifenesin  200 MG/5ML LIQD Take 5 mLs by mouth every 6 (six) hours as needed.   linagliptin  (TRADJENTA ) 5 MG TABS tablet Take 5 mg by mouth daily.   lisinopril  (ZESTRIL ) 20 MG tablet TAKE 1 TABLET BY MOUTH DAILY (Patient taking differently: Take 10 mg by mouth daily.)   metFORMIN  (GLUCOPHAGE -XR) 500 MG 24 hr tablet Take 1,000 mg by mouth 2 (two) times daily with a meal.   metoprolol  succinate (TOPROL -XL) 25 MG 24 hr tablet TAKE 1 TABLET BY MOUTH ONCE DAILY   Multiple Vitamin (MULTIVITAMIN WITH  MINERALS) TABS tablet Take 1 tablet by mouth daily.   Throat Lozenges (COUGH DROPS) LOZG Use as directed 1 lozenge in the mouth or throat as needed.   Wheat Dextrin (BENEFIBER PO) Take 1 each by mouth daily.   Infant Care Products [facility] EX) Apply to buttocks/periarea topically every shift (Patient not taking: Reported on 10/08/2023)   No facility-administered encounter medications on file as of 10/08/2023.    Review of Systems  Constitutional:  Negative for activity change, appetite change, fatigue and unexpected weight change.  HENT:  Positive for congestion. Negative for hearing loss.   Eyes: Negative.   Respiratory:  Negative for cough and shortness of breath.   Cardiovascular:  Negative for chest pain, palpitations and leg swelling.  Gastrointestinal:  Negative for  abdominal pain, constipation and diarrhea.  Genitourinary:  Negative for difficulty urinating and dysuria.  Musculoskeletal:  Negative for arthralgias and myalgias.  Skin:  Negative for color change and wound.  Neurological:  Negative for dizziness and weakness.  Psychiatric/Behavioral:  Negative for agitation, behavioral problems and confusion.      Immunization History  Administered Date(s) Administered   Fluad Quad(high Dose 65+) 12/20/2018   Influenza Split 01/01/2009, 01/02/2010, 01/13/2011, 12/22/2011   Influenza, High Dose Seasonal PF 01/18/2014, 01/01/2015, 11/30/2015, 12/01/2016, 01/11/2018, 12/08/2019, 12/26/2022   Influenza-Unspecified 01/08/2013, 11/08/2020, 12/25/2021   Moderna Covid-19 Fall Seasonal Vaccine 99yrs & older 06/17/2022, 12/05/2022   Moderna Covid-19 Vaccine Bivalent Booster 78yrs & up 11/30/2020, 08/06/2021, 01/17/2022   PFIZER Comirnaty(Gray Top)Covid-19 Tri-Sucrose Vaccine 07/27/2020   PFIZER(Purple Top)SARS-COV-2 Vaccination 04/01/2019, 04/22/2019, 12/05/2019, 07/27/2020   Pfizer Covid-19 Vaccine Bivalent Booster 39yrs & up 11/30/2020   Pneumococcal Conjugate-13 10/19/2013    Pneumococcal Polysaccharide-23 01/02/2010   Td 11/02/2003, 01/13/2011   Tdap 01/13/2011, 02/28/2015   Unspecified SARS-COV-2 Vaccination 06/19/2023   Zoster Recombinant(Shingrix) 08/11/2022, 11/25/2022   Zoster, Live 07/14/2007   Pertinent  Health Maintenance Due  Topic Date Due   FOOT EXAM  08/06/2023   INFLUENZA VACCINE  10/09/2023   HEMOGLOBIN A1C  12/11/2023   OPHTHALMOLOGY EXAM  05/03/2024   DEXA SCAN  Completed      07/24/2021    4:09 PM 07/24/2021   10:50 PM 07/25/2021   10:02 AM 07/26/2021    1:00 AM 09/18/2022    9:32 AM  Fall Risk  Falls in the past year?     0  (RETIRED) Patient Fall Risk Level Moderate fall risk  High fall risk  High fall risk  High fall risk       Data saved with a previous flowsheet row definition   Functional Status Survey:    Vitals:   10/08/23 0834  BP: (!) 101/58  Pulse: 66  Resp: 18  Temp: (!) 97.2 F (36.2 C)  SpO2: 97%  Weight: 158 lb 12.8 oz (72 kg)  Height: 5' 1 (1.549 m)   Body mass index is 30 kg/m. Physical Exam Constitutional:      General: She is not in acute distress.    Appearance: She is well-developed. She is not diaphoretic.  HENT:     Head: Normocephalic and atraumatic.     Mouth/Throat:     Pharynx: No oropharyngeal exudate.  Eyes:     Conjunctiva/sclera: Conjunctivae normal.     Pupils: Pupils are equal, round, and reactive to light.  Cardiovascular:     Rate and Rhythm: Normal rate and regular rhythm.     Heart sounds: Normal heart sounds.  Pulmonary:     Effort: Pulmonary effort is normal.     Breath sounds: Normal breath sounds.  Abdominal:     General: Bowel sounds are normal.     Palpations: Abdomen is soft.  Musculoskeletal:     Cervical back: Normal range of motion and neck supple.     Right lower leg: No edema.     Left lower leg: No edema.  Skin:    General: Skin is warm and dry.  Neurological:     Mental Status: She is alert.  Psychiatric:        Mood and Affect: Mood normal.      Labs reviewed: Recent Labs    11/20/22 0000 04/27/23 0000 06/11/23 0000  NA 141 141 141  K 4.5 4.3 4.3  CL 104 109* 106  CO2 30* 22 27*  BUN 32* 38* 25*  CREATININE 1.0 1.1 1.0  CALCIUM  9.4 8.7 9.0   Recent Labs    11/20/22 0000  AST 13  ALT 17  ALKPHOS 62  ALBUMIN 4.0   Recent Labs    04/27/23 0000  WBC 6.1  NEUTROABS 1,830.00  HGB 12.4  HCT 37  PLT 237   Lab Results  Component Value Date   TSH 2.284 04/17/2022   Lab Results  Component Value Date   HGBA1C 8.1 06/11/2023   Lab Results  Component Value Date   CHOL 241 (A) 11/20/2022   HDL 43 11/20/2022   LDLCALC 165 11/20/2022   TRIG 179 (A) 11/20/2022   CHOLHDL 5.2 05/10/2022    Significant Diagnostic Results in last 30 days:  No results found.  Assessment/Plan Aortic atherosclerosis (HCC) Continues on eliquis  Continues on zetia    CC (collagenous colitis) Stable at this time, continue current regimen  Chronic heart failure with preserved ejection fraction (HFpEF) (HCC) Euvolemic at this time, continue lasix  and metoprolol    COPD (chronic obstructive pulmonary disease) (HCC) Stable at this time, no recent exacerbations   Diabetes mellitus with circulatory complication, without long-term current use of insulin  (HCC) Continue current regimen, A1c not at goal but does not wish to take insulin  Encouraged dietary compliance, routine foot care/monitoring and to keep up with diabetic eye exams through ophthalmology    Essential hypertension Blood pressure well controlled, goal bp <140/90 Continue current medications and dietary modifications follow metabolic panel   Hyperlipidemia associated with type 2 diabetes mellitus (HCC) Continues on zetia .   Paroxysmal atrial fibrillation (HCC) Rate controlled on metoprolol , continues on eliquis    Vaginal bleeding Small amount of blood noted when wiping today.   Will monitor closely and nursing will notify if continues.    Damarious Holtsclaw K.  Caro BODILY [facility] [phone]

## 2023-10-09 ENCOUNTER — Encounter: Payer: Self-pay | Admitting: Student

## 2023-10-09 ENCOUNTER — Non-Acute Institutional Stay (SKILLED_NURSING_FACILITY): Payer: Self-pay | Admitting: Student

## 2023-10-09 DIAGNOSIS — N939 Abnormal uterine and vaginal bleeding, unspecified: Secondary | ICD-10-CM

## 2023-10-09 DIAGNOSIS — N368 Other specified disorders of urethra: Secondary | ICD-10-CM

## 2023-10-09 NOTE — Progress Notes (Signed)
 Location:  Other ([facility] Room Number: 517-A Place of Service:  SNF [phone]) Provider:  Abdul Richerd ROLLA Abdul Richerd, MD  Patient Care Team: Abdul Richerd, MD as PCP - General (Family Medicine) End, Lonni, MD as PCP - Cardiology (Cardiology) Mittie Gaskin, MD as Referring Physician (Ophthalmology) Caro Harlene POUR, NP as Nurse Practitioner (Geriatric Medicine) [facility], [facility])  Extended Emergency Contact Information Primary Emergency Contact: Bridgette Barnie DEL Address: [address]          [place], [place] [address] [place] Home Phone: [phone] Work Phone: [phone] Relation: Daughter  Code Status:  DNR Goals of care: Advanced Directive information    10/08/2023    8:36 AM  Advanced Directives  Does Patient Have a Medical Advance Directive? Yes  Type of Advance Directive Out of facility DNR (pink MOST or yellow form)  Does patient want to make changes to medical advance directive? No - Patient declined     Chief Complaint  Patient presents with   Medical Management of Chronic Issues    Routine visit and discuss vaginal bleeding. Discuss need for annual wellness visit    HPI:  Pt is a 89 y.o. female seen today for acute concern: vaginal bleeding History of Present Illness The patient, with a history of urethral prolapse, presents with vaginal bleeding.  She experienced vaginal bleeding yesterday, which has since resolved. She had a hysterectomy in the past, making the bleeding unexpected. Previously, a urethral prolapse was identified as a source of bleeding, but no treatment was pursued, and the bleeding stopped on its own.  She recently had a cold lasting more than seven days, characterized by a runny nose and feeling 'crummy' but without fever or difficulty breathing. She is drinking plenty of fluids and does not require medication for a runny nose.  No current symptoms of urinary tract  infection, such as burning during urination, back pain, or bladder pain.   Past Medical History:  Diagnosis Date   Acute pancreatitis 07/24/2021   Basal cell carcinoma 03/30/2008   left upper arm/excision   Basal cell carcinoma 03/31/2008   left upper arm, right lower leg   Basal cell carcinoma 01/18/2009   left upper arm   Collagenous colitis    Diabetes mellitus without complication (HCC)    Diverticulitis    Elevated LFTs 07/25/2021   Heart murmur    at birth, none since   History of echocardiogram    a. 11/2015: echo showing EF of 60-65% with no WMA. Grade 1 DD and mild MR noted.    Hyperlipidemia    Per [facility]   Hypertension    Intertrochanteric fracture of right femur (HCC) 06/11/2019   Lack of coordination    Per [facility] term current use of anticoagulant    Per [facility]   Noninfective gastroenteritis and colitis, unspecified    Per [facility]   PAF (paroxysmal atrial fibrillation) (HCC)    a. initially diagnosed in 08/2016 --> started on Eliquis    Squamous cell carcinoma of skin 05/16/2020   Left nasal tip anterior to ala, refer to [facility]   Syncope    a. initially occurring in Fall 2016 b. Hospitalized in 10/2015 and 11/2015 for recurrent episodes.    Past Surgical History:  Procedure Laterality Date   ABDOMINAL HYSTERECTOMY     BACK SURGERY  08/08/2008   Lumbar discectomy   COLONOSCOPY WITH PROPOFOL      EXCISION/RELEASE BURSA HIP Left 05/03/2019  Procedure: HIP ABDUCTOR REPAIR;  Surgeon: Kathlynn Sharper, MD;  Location: [facility] ORS;  Service: Orthopedics;  Laterality: Left;   INTRAMEDULLARY (IM) NAIL INTERTROCHANTERIC Right 06/13/2019   Procedure: INTRAMEDULLARY (IM) NAIL INTERTROCHANTRIC;  Surgeon: Kathlynn Sharper, MD;  Location: [facility] ORS;  Service: Orthopedics;  Laterality: Right;   NECK SURGERY  1980    Allergies  Allergen Reactions   Morphine  Sulfate Other (See Comments)    BP bottoms out   Penicillins Diarrhea    Has patient had a PCN reaction  causing immediate rash, facial/tongue/throat swelling, SOB or lightheadedness with hypotension: Yes Has patient had a PCN reaction causing severe rash involving mucus membranes or skin necrosis: Yes Has patient had a PCN reaction that required hospitalization No Has patient had a PCN reaction occurring within the last 10 years: No If all of the above answers are NO, then may proceed with Cephalosporin use.    Outpatient Encounter Medications as of 10/09/2023  Medication Sig   acetaminophen  (TYLENOL ) 325 MG tablet Take 650 mg by mouth 2 (two) times daily. Give 650mg  by mouth every 8 hours as needed   apixaban  (ELIQUIS ) 5 MG TABS tablet Take 1 tablet (5 mg total) by mouth 2 (two) times daily.   ARTIFICIAL TEAR SOLUTION OP Apply 1 drop to eye 4 (four) times daily.   bismuth subsalicylate (PEPTO BISMOL) 262 MG/15ML suspension Take 30 mLs by mouth every 4 (four) hours as needed.   Cholecalciferol  (VITAMIN D ) 50 MCG (2000 UT) CAPS Give one capsule by mouth once daily .   Clobetasol  Propionate 0.05 % shampoo Apply 1 Application topically every morning. And wash out. Tues,Fri   empagliflozin  (JARDIANCE ) 10 MG TABS tablet Take 1 tablet (10 mg total) by mouth daily before breakfast.   Ensure (ENSURE) Take 237 mLs by mouth as needed (For weight loss or decreased intake).   ezetimibe  (ZETIA ) 10 MG tablet Take 1 tablet (10 mg total) by mouth daily.   fluocinolone  (SYNALAR ) 0.01 % external solution Apply 1 Application topically at bedtime. Apply to scalp   furosemide  (LASIX ) 20 MG tablet Take 20 mg by mouth daily. Give one tablet by mouth every 24 hours as needed if 3lbs or greater weight gain in 24hrs.   glipiZIDE  (GLUCOTROL ) 10 MG tablet Take 10 mg by mouth 2 (two) times daily before a meal.   Guaifenesin  200 MG/5ML LIQD Take 5 mLs by mouth every 6 (six) hours as needed.   linagliptin  (TRADJENTA ) 5 MG TABS tablet Take 5 mg by mouth daily.   lisinopril  (ZESTRIL ) 10 MG tablet Take 10 mg by mouth daily.    metFORMIN  (GLUCOPHAGE -XR) 500 MG 24 hr tablet Take 1,000 mg by mouth 2 (two) times daily with a meal.   metoprolol  succinate (TOPROL -XL) 25 MG 24 hr tablet TAKE 1 TABLET BY MOUTH ONCE DAILY   Multiple Vitamin (MULTIVITAMIN WITH MINERALS) TABS tablet Take 1 tablet by mouth daily.   Throat Lozenges (COUGH DROPS) LOZG Use as directed 1 lozenge in the mouth or throat as needed.   Wheat Dextrin (BENEFIBER PO) Take 1 each by mouth daily.   Infant Care Products [facility] EX) Apply to buttocks/periarea topically every shift (Patient not taking: Reported on 10/09/2023)   lisinopril  (ZESTRIL ) 20 MG tablet TAKE 1 TABLET BY MOUTH DAILY (Patient not taking: Reported on 10/09/2023)   No facility-administered encounter medications on file as of 10/09/2023.    Review of Systems  Immunization History  Administered Date(s) Administered   Fluad Quad(high Dose 65+) 12/20/2018   Influenza  Split 01/01/2009, 01/02/2010, 01/13/2011, 12/22/2011   Influenza, High Dose Seasonal PF 01/18/2014, 01/01/2015, 11/30/2015, 12/01/2016, 01/11/2018, 12/08/2019, 12/26/2022   Influenza-Unspecified 01/08/2013, 11/08/2020, 12/25/2021   Moderna Covid-19 Fall Seasonal Vaccine 40yrs & older 06/17/2022, 12/05/2022   Moderna Covid-19 Vaccine Bivalent Booster 11yrs & up 11/30/2020, 08/06/2021, 01/17/2022   PFIZER Comirnaty(Gray Top)Covid-19 Tri-Sucrose Vaccine 07/27/2020   PFIZER(Purple Top)SARS-COV-2 Vaccination 04/01/2019, 04/22/2019, 12/05/2019, 07/27/2020   Pfizer Covid-19 Vaccine Bivalent Booster 15yrs & up 11/30/2020   Pneumococcal Conjugate-13 10/19/2013   Pneumococcal Polysaccharide-23 01/02/2010   Td 11/02/2003, 01/13/2011   Tdap 01/13/2011, 02/28/2015   Unspecified SARS-COV-2 Vaccination 06/19/2023   Zoster Recombinant(Shingrix) 08/11/2022, 11/25/2022   Zoster, Live 07/14/2007   Pertinent  Health Maintenance Due  Topic Date Due   INFLUENZA VACCINE  12/09/2023 (Originally 10/09/2023)   HEMOGLOBIN A1C  12/11/2023    OPHTHALMOLOGY EXAM  05/03/2024   FOOT EXAM  08/25/2024   DEXA SCAN  Completed      07/24/2021    4:09 PM 07/24/2021   10:50 PM 07/25/2021   10:02 AM 07/26/2021    1:00 AM 09/18/2022    9:32 AM  Fall Risk  Falls in the past year?     0  (RETIRED) Patient Fall Risk Level Moderate fall risk  High fall risk  High fall risk  High fall risk       Data saved with a previous flowsheet row definition   Functional Status Survey:    Vitals:   10/09/23 1437  BP: 110/74  Pulse: 67  Resp: 18  Temp: (!) 97.2 F (36.2 C)  SpO2: 97%  Weight: 158 lb 3.2 oz (71.8 kg)  Height: 5' 1 (1.549 m)   Body mass index is 29.89 kg/m. Physical Exam Constitutional:      Appearance: Normal appearance.  Cardiovascular:     Rate and Rhythm: Normal rate and regular rhythm.     Pulses: Normal pulses.     Heart sounds: Normal heart sounds.  Pulmonary:     Effort: Pulmonary effort is normal.  Abdominal:     General: Abdomen is flat. Bowel sounds are normal.     Palpations: Abdomen is soft.  Genitourinary:    Comments: Deferred per patient preference.  Musculoskeletal:        General: No swelling or tenderness.  Skin:    General: Skin is warm and dry.  Neurological:     Mental Status: She is alert and oriented to person, place, and time.     Gait: Gait normal.  Psychiatric:        Mood and Affect: Mood normal.     Labs reviewed: Recent Labs    11/20/22 0000 04/27/23 0000 06/11/23 0000  NA 141 141 141  K 4.5 4.3 4.3  CL 104 109* 106  CO2 30* 22 27*  BUN 32* 38* 25*  CREATININE 1.0 1.1 1.0  CALCIUM  9.4 8.7 9.0   Recent Labs    11/20/22 0000  AST 13  ALT 17  ALKPHOS 62  ALBUMIN 4.0   Recent Labs    04/27/23 0000  WBC 6.1  NEUTROABS 1,830.00  HGB 12.4  HCT 37  PLT 237   Lab Results  Component Value Date   TSH 2.284 04/17/2022   Lab Results  Component Value Date   HGBA1C 8.1 06/11/2023   Lab Results  Component Value Date   CHOL 241 (A) 11/20/2022   HDL 43  11/20/2022   LDLCALC 165 11/20/2022   TRIG 179 (A) 11/20/2022   CHOLHDL  5.2 05/10/2022    Significant Diagnostic Results in last 30 days:  No results found.  Assessment/Plan Urethral prolapse with intermittent vaginal bleeding Intermittent vaginal bleeding, likely related to urethral prolapse. Previous episodes resolved spontaneously. No current urinary tract infection symptoms. Differential includes bleeding from urethral prolapse or another source. No immediate intervention required unless symptoms worsen. - Recommend follow-up with urology if bleeding recurs or worsens.  Acute upper respiratory infection (cold) Symptoms for more than seven days, including rhinorrhea, without fever or dyspnea. No COVID-19 symptoms. Symptoms are mild and self-limiting. - Prescribe Flonase  nasal spray for symptomatic relief of rhinorrhea. - Advise to monitor symptoms and maintain adequate hydration.  Family/ staff Communication: nursing  Labs/tests ordered:  none

## 2023-10-12 ENCOUNTER — Encounter: Payer: Self-pay | Admitting: Student

## 2023-10-19 ENCOUNTER — Non-Acute Institutional Stay (SKILLED_NURSING_FACILITY): Payer: Self-pay | Admitting: Student

## 2023-10-19 ENCOUNTER — Encounter: Payer: Self-pay | Admitting: Student

## 2023-10-19 ENCOUNTER — Other Ambulatory Visit: Payer: Self-pay | Admitting: Student

## 2023-10-19 DIAGNOSIS — N368 Other specified disorders of urethra: Secondary | ICD-10-CM

## 2023-10-19 DIAGNOSIS — N939 Abnormal uterine and vaginal bleeding, unspecified: Secondary | ICD-10-CM | POA: Diagnosis not present

## 2023-10-19 DIAGNOSIS — N950 Postmenopausal bleeding: Secondary | ICD-10-CM

## 2023-10-19 DIAGNOSIS — R310 Gross hematuria: Secondary | ICD-10-CM

## 2023-10-19 LAB — CBC AND DIFFERENTIAL
HCT: 36 (ref 36–46)
Hemoglobin: 12 (ref 12.0–16.0)
Neutrophils Absolute: 2596
Platelets: 263 K/uL (ref 150–400)
WBC: 6.3

## 2023-10-19 LAB — CBC: RBC: 3.88 (ref 3.87–5.11)

## 2023-10-19 NOTE — Progress Notes (Signed)
 Patient with vaginal bleeding, imaging to evaluate for metastatic disease.

## 2023-10-20 ENCOUNTER — Ambulatory Visit: Admission: RE | Admit: 2023-10-20 | Source: Ambulatory Visit

## 2023-10-20 ENCOUNTER — Other Ambulatory Visit: Payer: Self-pay | Admitting: Student

## 2023-10-20 DIAGNOSIS — N368 Other specified disorders of urethra: Secondary | ICD-10-CM

## 2023-10-20 DIAGNOSIS — R310 Gross hematuria: Secondary | ICD-10-CM

## 2023-10-20 DIAGNOSIS — N950 Postmenopausal bleeding: Secondary | ICD-10-CM

## 2023-10-21 ENCOUNTER — Ambulatory Visit: Admission: RE | Admit: 2023-10-21 | Source: Ambulatory Visit

## 2023-10-23 ENCOUNTER — Ambulatory Visit
Admission: RE | Admit: 2023-10-23 | Discharge: 2023-10-23 | Disposition: A | Discharge status: Home / Self Care | Source: Ambulatory Visit | Attending: Student | Admitting: Student

## 2023-10-23 DIAGNOSIS — N950 Postmenopausal bleeding: Secondary | ICD-10-CM | POA: Insufficient documentation

## 2023-10-23 DIAGNOSIS — R310 Gross hematuria: Secondary | ICD-10-CM | POA: Insufficient documentation

## 2023-10-23 DIAGNOSIS — N368 Other specified disorders of urethra: Secondary | ICD-10-CM | POA: Diagnosis present

## 2023-10-23 LAB — POCT I-STAT CREATININE: Creatinine, Ser: 1.5 mg/dL — ABNORMAL HIGH (ref 0.44–1.00)

## 2023-10-23 MED ORDER — IOHEXOL 300 MG/ML  SOLN
100.0000 mL | Freq: Once | INTRAMUSCULAR | Status: AC | PRN
  Administered 2023-10-23: 85 mL via INTRAVENOUS

## 2023-10-23 MED ORDER — SODIUM CHLORIDE 0.9 % IV BOLUS: 250.0000 mL | Freq: Once | INTRAVENOUS | Status: DC

## 2023-10-25 ENCOUNTER — Encounter: Payer: Self-pay | Admitting: Student

## 2023-10-25 NOTE — Progress Notes (Signed)
 Location:  Other Nursing Home Room Number: 517A Place of Service:  SNF (31) Provider:  Abdul Abdul, Richerd, MD  Patient Care Team: Abdul Richerd, MD as PCP - General (Family Medicine) End, Lonni, MD as PCP - Cardiology (Cardiology) Mittie Gaskin, MD as Referring Physician (Ophthalmology) Caro Harlene POUR, NP as Nurse Practitioner (Geriatric Medicine) Pa, [facility])  Extended Emergency Contact Information Primary Emergency Contact: Bridgette Barnie DEL Address: [address]          [place], [place] [address] [place] Home Phone: [phone] Work Phone: [phone] Relation: Daughter  Code Status:  DNR Goals of care: Advanced Directive information    10/08/2023    8:36 AM  Advanced Directives  Does Patient Have a Medical Advance Directive? Yes  Type of Advance Directive Out of facility DNR (pink MOST or yellow form)  Does patient want to make changes to medical advance directive? No - Patient declined     Chief Complaint  Patient presents with   Acute Visit    Vaginal bleeding    HPI:  [name] is a 89 y.o. female seen today for an acute visit.  Discussed the use of AI scribe software for clinical note transcription with the patient, who gave verbal consent to proceed.  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  She has been experiencing vaginal bleeding, which is unusual for her. The bleeding is not heavy, as she has only filled one pad today, and she finds it manageable to change a pad every four to six hours. She is wearing white pants, indicating the bleeding is not excessive.  She has a history of urethral prolapse, which previously led to bleeding. However, the current bleeding is described as more vaginal in nature. A CBC was collected earlier today.   Past Medical History:  Diagnosis Date   Acute  pancreatitis 07/24/2021   Basal cell carcinoma 03/30/2008   left upper arm/excision   Basal cell carcinoma 03/31/2008   left upper arm, right lower leg   Basal cell carcinoma 01/18/2009   left upper arm   Collagenous colitis    Diabetes mellitus without complication (HCC)    Diverticulitis    Elevated LFTs 07/25/2021   Heart murmur    at birth, none since   History of echocardiogram    a. 11/2015: echo showing EF of 60-65% with no WMA. Grade 1 DD and mild MR noted.    Hyperlipidemia    Per [facility]   Hypertension    Intertrochanteric fracture of right femur (HCC) 06/11/2019   Lack of coordination    Per [facility] term current use of anticoagulant    Per [facility]   Noninfective gastroenteritis and colitis, unspecified    Per [facility]   PAF (paroxysmal atrial fibrillation) (HCC)    a. initially diagnosed in 08/2016 --> started on Eliquis    Squamous cell carcinoma of skin 05/16/2020   Left nasal tip anterior to ala, refer to [facility]   Syncope    a. initially occurring in Fall 2016 b. Hospitalized in 10/2015 and 11/2015 for recurrent episodes.    Past Surgical History:  Procedure Laterality Date   ABDOMINAL HYSTERECTOMY     BACK SURGERY  08/08/2008   Lumbar discectomy   COLONOSCOPY WITH PROPOFOL      EXCISION/RELEASE BURSA HIP Left 05/03/2019   Procedure: HIP ABDUCTOR REPAIR;  Surgeon:  Kathlynn Sharper, MD;  Location: [facility] ORS;  Service: Orthopedics;  Laterality: Left;   INTRAMEDULLARY (IM) NAIL INTERTROCHANTERIC Right 06/13/2019   Procedure: INTRAMEDULLARY (IM) NAIL INTERTROCHANTRIC;  Surgeon: Kathlynn Sharper, MD;  Location: [facility] ORS;  Service: Orthopedics;  Laterality: Right;   NECK SURGERY  1980    Allergies  Allergen Reactions   Morphine  Sulfate Other (See Comments)    BP bottoms out   Penicillins Diarrhea    Has patient had a PCN reaction causing immediate rash, facial/tongue/throat swelling, SOB or lightheadedness with hypotension: Yes Has patient had a PCN  reaction causing severe rash involving mucus membranes or skin necrosis: Yes Has patient had a PCN reaction that required hospitalization No Has patient had a PCN reaction occurring within the last 10 years: No If all of the above answers are NO, then may proceed with Cephalosporin use.    Outpatient Encounter Medications as of 10/19/2023  Medication Sig   acetaminophen  (TYLENOL ) 325 MG tablet Take 650 mg by mouth 2 (two) times daily. Give 650mg  by mouth every 8 hours as needed   apixaban  (ELIQUIS ) 5 MG TABS tablet Take 1 tablet (5 mg total) by mouth 2 (two) times daily.   ARTIFICIAL TEAR SOLUTION OP Apply 1 drop to eye 4 (four) times daily.   bismuth subsalicylate (PEPTO BISMOL) 262 MG/15ML suspension Take 30 mLs by mouth every 4 (four) hours as needed.   Cholecalciferol  (VITAMIN D ) 50 MCG (2000 UT) CAPS Give one capsule by mouth once daily .   Clobetasol  Propionate 0.05 % shampoo Apply 1 Application topically every morning. And wash out. Tues,Fri   empagliflozin  (JARDIANCE ) 10 MG TABS tablet Take 1 tablet (10 mg total) by mouth daily before breakfast.   Ensure (ENSURE) Take 237 mLs by mouth as needed (For weight loss or decreased intake).   ezetimibe  (ZETIA ) 10 MG tablet Take 1 tablet (10 mg total) by mouth daily.   fluocinolone  (SYNALAR ) 0.01 % external solution Apply 1 Application topically at bedtime. Apply to scalp   furosemide  (LASIX ) 20 MG tablet Take 20 mg by mouth daily. Give one tablet by mouth every 24 hours as needed if 3lbs or greater weight gain in 24hrs.   glipiZIDE  (GLUCOTROL ) 10 MG tablet Take 10 mg by mouth 2 (two) times daily before a meal.   Guaifenesin  200 MG/5ML LIQD Take 5 mLs by mouth every 6 (six) hours as needed.   Infant Care Products [facility] EX) Apply to buttocks/periarea topically every shift (Patient not taking: Reported on 10/09/2023)   linagliptin  (TRADJENTA ) 5 MG TABS tablet Take 5 mg by mouth daily.   lisinopril  (ZESTRIL ) 10 MG tablet Take 10 mg by mouth  daily.   lisinopril  (ZESTRIL ) 20 MG tablet TAKE 1 TABLET BY MOUTH DAILY (Patient not taking: Reported on 10/09/2023)   metFORMIN  (GLUCOPHAGE -XR) 500 MG 24 hr tablet Take 1,000 mg by mouth 2 (two) times daily with a meal.   metoprolol  succinate (TOPROL -XL) 25 MG 24 hr tablet TAKE 1 TABLET BY MOUTH ONCE DAILY   Multiple Vitamin (MULTIVITAMIN WITH MINERALS) TABS tablet Take 1 tablet by mouth daily.   Throat Lozenges (COUGH DROPS) LOZG Use as directed 1 lozenge in the mouth or throat as needed.   Wheat Dextrin (BENEFIBER PO) Take 1 each by mouth daily.   No facility-administered encounter medications on file as of 10/19/2023.    Review of Systems  Immunization History  Administered Date(s) Administered   Fluad Quad(high Dose 65+) 12/20/2018   Influenza Split 01/01/2009, 01/02/2010, 01/13/2011, 12/22/2011  Influenza, High Dose Seasonal PF 01/18/2014, 01/01/2015, 11/30/2015, 12/01/2016, 01/11/2018, 12/08/2019, 12/26/2022   Influenza-Unspecified 01/08/2013, 11/08/2020, 12/25/2021   Moderna Covid-19 Fall Seasonal Vaccine 85yrs & older 06/17/2022, 12/05/2022   Moderna Covid-19 Vaccine Bivalent Booster 47yrs & up 11/30/2020, 08/06/2021, 01/17/2022   PFIZER Comirnaty(Gray Top)Covid-19 Tri-Sucrose Vaccine 07/27/2020   PFIZER(Purple Top)SARS-COV-2 Vaccination 04/01/2019, 04/22/2019, 12/05/2019, 07/27/2020   Pfizer Covid-19 Vaccine Bivalent Booster 51yrs & up 11/30/2020   Pneumococcal Conjugate-13 10/19/2013   Pneumococcal Polysaccharide-23 01/02/2010   Td 11/02/2003, 01/13/2011   Tdap 01/13/2011, 02/28/2015   Unspecified SARS-COV-2 Vaccination 06/19/2023   Zoster Recombinant(Shingrix) 08/11/2022, 11/25/2022   Zoster, Live 07/14/2007   Pertinent  Health Maintenance Due  Topic Date Due   INFLUENZA VACCINE  12/09/2023 (Originally 10/09/2023)   HEMOGLOBIN A1C  12/11/2023   OPHTHALMOLOGY EXAM  05/03/2024   FOOT EXAM  08/25/2024   DEXA SCAN  Completed      07/24/2021    4:09 PM 07/24/2021   10:50  PM 07/25/2021   10:02 AM 07/26/2021    1:00 AM 09/18/2022    9:32 AM  Fall Risk  Falls in the past year?     0  (RETIRED) Patient Fall Risk Level Moderate fall risk  High fall risk  High fall risk  High fall risk       Data saved with a previous flowsheet row definition   Functional Status Survey:    There were no vitals filed for this visit. There is no height or weight on file to calculate BMI. Physical Exam Exam conducted with a chaperone present.  Abdominal:     Hernia: There is no hernia in the left inguinal area or right inguinal area.  Genitourinary:    Pubic Area: No rash.      Labia:        Right: No rash, tenderness, lesion or injury.        Left: No rash, tenderness, lesion or injury.      Urethra: Prolapse and urethral swelling present.      Comments: Small amount of blood from uretral region Neurological:     Mental Status: She is alert.     Labs reviewed: Recent Labs    11/20/22 0000 04/27/23 0000 06/11/23 0000 10/23/23 1510  NA 141 141 141  --   K 4.5 4.3 4.3  --   CL 104 109* 106  --   CO2 30* 22 27*  --   BUN 32* 38* 25*  --   CREATININE 1.0 1.1 1.0 1.50*  CALCIUM  9.4 8.7 9.0  --    Recent Labs    11/20/22 0000  AST 13  ALT 17  ALKPHOS 62  ALBUMIN 4.0   Recent Labs    04/27/23 0000  WBC 6.1  NEUTROABS 1,830.00  HGB 12.4  HCT 37  PLT 237   Lab Results  Component Value Date   TSH 2.284 04/17/2022   Lab Results  Component Value Date   HGBA1C 8.1 06/11/2023   Lab Results  Component Value Date   CHOL 241 (A) 11/20/2022   HDL 43 11/20/2022   LDLCALC 165 11/20/2022   TRIG 179 (A) 11/20/2022   CHOLHDL 5.2 05/10/2022    Significant Diagnostic Results in last 30 days:  CT ABDOMEN PELVIS W WO CONTRAST Result Date: 10/23/2023 CLINICAL DATA:  Metastatic disease evaluation. * Tracking Code: BO * EXAM: CT ABDOMEN AND PELVIS WITHOUT AND WITH CONTRAST TECHNIQUE: Multidetector CT imaging of the abdomen and pelvis was performed following  the standard  protocol before and following the bolus administration of intravenous contrast. RADIATION DOSE REDUCTION: This exam was performed according to the departmental dose-optimization program which includes automated exposure control, adjustment of the mA and/or kV according to patient size and/or use of iterative reconstruction technique. CONTRAST:  85mL OMNIPAQUE  IOHEXOL  300 MG/ML  SOLN COMPARISON:  CT scan abdomen and pelvis from 05/08/2022. FINDINGS: Lower chest: There are patchy atelectatic changes in the visualized lung bases. No overt consolidation. No pleural effusion. The heart is normal in size. No pericardial effusion. There are coronary artery atherosclerotic calcifications, in keeping with coronary artery disease. Hepatobiliary: The liver is normal in size. Non-cirrhotic configuration. No suspicious mass. These is mild diffuse hepatic steatosis. No intrahepatic or extrahepatic bile duct dilation. No calcified gallstones. Normal gallbladder wall thickness. No pericholecystic inflammatory changes. Pancreas: Mild diffuse atrophy of pancreas. Few dystrophic calcifications in the body and tail, nonspecific but can be seen with sequela of chronic pancreatitis. No focal mass. No peripancreatic fat stranding. Main pancreatic duct is not dilated. Spleen: Within normal limits. No focal lesion. Adrenals/Urinary Tract: Adrenal glands are unremarkable. No suspicious renal mass. There is a partially exophytic 2.2 x 2.4 cm cyst arising from the left kidney lower pole, medially. Malascended right kidney noted. There is a 2 mm nonobstructing calculus in the right kidney upper pole calyx. No other nephroureterolithiasis or obstructive uropathy on either side. Urinary bladder is under distended, precluding optimal assessment. However, no large mass or stones identified. There is mild asymmetric bladder wall thickening, which is likely secondary to underlying trabeculations. No perivesical fat stranding. Correlate  clinically and with urinalysis to exclude cystitis. Stomach/Bowel: No disproportionate dilation of the small or large bowel loops. No evidence of abnormal bowel wall thickening or inflammatory changes. The appendix is unremarkable. There are multiple diverticula throughout the colon, without imaging signs of diverticulitis. Vascular/Lymphatic: No ascites or pneumoperitoneum. No abdominal or pelvic lymphadenopathy, by size criteria. No aneurysmal dilation of the major abdominal arteries. There are moderate peripheral atherosclerotic vascular calcifications of the aorta and its major branches. Reproductive: The uterus is surgically absent. No large adnexal mass. Other: There is a small to medium fat containing periumbilical hernia. The soft tissues and abdominal wall are otherwise unremarkable. Musculoskeletal: No suspicious osseous lesions. There are mild - moderate multilevel degenerative changes in the visualized spine. Right proximal femur metallic hardware noted. IMPRESSION: 1. No metastatic disease identified within the abdomen or pelvis. 2. No acute inflammatory process identified within the abdomen or pelvis. 3. Multiple other nonacute observations, as described above. 4. Aortic atherosclerosis. Electronically Signed   By: Ree Molt M.D.   On: 10/23/2023 15:52    Assessment/Plan Vaginal bleeding Vaginal bleeding in an 89 year old female, raising suspicion for malignancy such as uterine or vaginal cancer. Bleeding is currently manageable, with one pad every four to six hours. She is ambulatory and prefers to avoid hospital settings and surgery. - Arrange outpatient gynecology appointment for evaluation of vaginal bleeding - If bleeding increases to a pad per hour, consider emergency evaluation  Urethral prolapse Urethral prolapse with visible friable tissue at the urethral apex, approximately one inch of prolapsed tissue. No tenderness or pain reported. Differential diagnosis includes determining  if bleeding is from the urethra or uterus. - Order urinalysis and culture to assess for urinary symptoms - Order outpatient CT scan in preparation for potential urology appointment  Family/ staff Communication: Nursing  Labs/tests ordered: CBC

## 2023-10-26 ENCOUNTER — Ambulatory Visit (INDEPENDENT_AMBULATORY_CARE_PROVIDER_SITE_OTHER): Admitting: Urology

## 2023-10-26 VITALS — BP 129/73 | HR 67

## 2023-10-26 DIAGNOSIS — R319 Hematuria, unspecified: Secondary | ICD-10-CM | POA: Diagnosis not present

## 2023-10-26 NOTE — Progress Notes (Signed)
 10/26/2023 11:30 AM   [name] 09/05/1931 [id number]  Referring provider: Abdul Fine, MD [address] [place],  [place] [address]  No chief complaint on file.   HPI: Dr. Twylla: Vaginal bleeding secondary to urethral mass on Eliquis .  Urinary incontinence  Today I reviewed the photo taken by Dr. Twylla and the finding is in keeping with hemorrhagic urethra mucosal prolapse Patient is not a strong historian.  She says she sometimes wears pads during the day but wears them at night.  She then says she does not have urge incontinence or bedwetting.  She voids every 2 or 3 hours.  She is not sure she sees blood in the urine or blood with wiping or on a pad.  She says she has not seen blood for a number of days.  She has a history of atrial fibrillation on Eliquis   Patient had a CT scan of the abdomen pelvis with and without contrast October 23, 2023.  She had a cyst on the left kidney and a 2 mm nonobstructing stone in the right kidney.  She had a little bit of thickening of her bladder felt secondary to trabeculation.  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  There was extending 2 and half centimeters or less.  There was a little bit of a hemorrhagic area closer to the meatus itself.  Nontender.  Nonfriable.  She had a grade 3 cystocele and has limited mobility  PMH: Past Medical History:  Diagnosis Date   Acute pancreatitis 07/24/2021   Basal cell carcinoma 03/30/2008   left upper arm/excision   Basal cell carcinoma 03/31/2008   left upper arm, right lower leg   Basal cell carcinoma 01/18/2009   left upper arm   Collagenous colitis    Diabetes mellitus without complication (HCC)    Diverticulitis    Elevated LFTs 07/25/2021   Heart murmur    at birth, none since   History of echocardiogram    a. 11/2015: echo showing EF of 60-65% with no WMA. Grade 1 DD and mild MR noted.    Hyperlipidemia    Per [facility]    Hypertension    Intertrochanteric fracture of right femur (HCC) 06/11/2019   Lack of coordination    Per [facility] term current use of anticoagulant    Per [facility]   Noninfective gastroenteritis and colitis, unspecified    Per [facility]   PAF (paroxysmal atrial fibrillation) (HCC)    a. initially diagnosed in 08/2016 --> started on Eliquis    Squamous cell carcinoma of skin 05/16/2020   Left nasal tip anterior to ala, refer to [facility]   Syncope    a. initially occurring in Fall 2016 b. Hospitalized in 10/2015 and 11/2015 for recurrent episodes.     Surgical History: Past Surgical History:  Procedure Laterality Date   ABDOMINAL HYSTERECTOMY     BACK SURGERY  08/08/2008   Lumbar discectomy   COLONOSCOPY WITH PROPOFOL      EXCISION/RELEASE BURSA HIP Left 05/03/2019   Procedure: HIP ABDUCTOR REPAIR;  Surgeon: Kathlynn Sharper, MD;  Location: [facility] ORS;  Service: Orthopedics;  Laterality: Left;   INTRAMEDULLARY (IM) NAIL INTERTROCHANTERIC Right 06/13/2019   Procedure: INTRAMEDULLARY (IM) NAIL INTERTROCHANTRIC;  Surgeon: Kathlynn Sharper, MD;  Location: [facility] ORS;  Service: Orthopedics;  Laterality: Right;   NECK SURGERY  1980    Home Medications:  Allergies as of 10/26/2023  Reactions   Morphine  Sulfate Other (See Comments)   BP bottoms out   Penicillins Diarrhea   Has patient had a PCN reaction causing immediate rash, facial/tongue/throat swelling, SOB or lightheadedness with hypotension: Yes Has patient had a PCN reaction causing severe rash involving mucus membranes or skin necrosis: Yes Has patient had a PCN reaction that required hospitalization No Has patient had a PCN reaction occurring within the last 10 years: No If all of the above answers are NO, then may proceed with Cephalosporin use.        Medication List        Accurate as of October 26, 2023 11:30 AM. If you have any questions, ask your nurse or doctor.          acetaminophen  325 MG  tablet Commonly known as: TYLENOL  Take 650 mg by mouth 2 (two) times daily. Give 650mg  by mouth every 8 hours as needed   apixaban  5 MG Tabs tablet Commonly known as: ELIQUIS  Take 1 tablet (5 mg total) by mouth 2 (two) times daily.   ARTIFICIAL TEAR SOLUTION OP Apply 1 drop to eye 4 (four) times daily.   BENEFIBER PO Take 1 each by mouth daily.   bismuth subsalicylate 262 MG/15ML suspension Commonly known as: PEPTO BISMOL Take 30 mLs by mouth every 4 (four) hours as needed.   Clobetasol  Propionate 0.05 % shampoo Apply 1 Application topically every morning. And wash out. Tues,Fri   Cough Drops Lozg Use as directed 1 lozenge in the mouth or throat as needed.   DERMACLOUD EX Apply to buttocks/periarea topically every shift   empagliflozin  10 MG Tabs tablet Commonly known as: Jardiance  Take 1 tablet (10 mg total) by mouth daily before breakfast.   Ensure Take 237 mLs by mouth as needed (For weight loss or decreased intake).   ezetimibe  10 MG tablet Commonly known as: Zetia  Take 1 tablet (10 mg total) by mouth daily.   fluocinolone  0.01 % external solution Commonly known as: SYNALAR  Apply 1 Application topically at bedtime. Apply to scalp   furosemide  20 MG tablet Commonly known as: LASIX  Take 20 mg by mouth daily. Give one tablet by mouth every 24 hours as needed if 3lbs or greater weight gain in 24hrs.   glipiZIDE  10 MG tablet Commonly known as: GLUCOTROL  Take 10 mg by mouth 2 (two) times daily before a meal.   Guaifenesin  200 MG/5ML Liqd Take 5 mLs by mouth every 6 (six) hours as needed.   linagliptin  5 MG Tabs tablet Commonly known as: TRADJENTA  Take 5 mg by mouth daily.   lisinopril  10 MG tablet Commonly known as: ZESTRIL  Take 10 mg by mouth daily.   lisinopril  20 MG tablet Commonly known as: ZESTRIL  TAKE 1 TABLET BY MOUTH DAILY   metFORMIN  500 MG 24 hr tablet Commonly known as: GLUCOPHAGE -XR Take 1,000 mg by mouth 2 (two) times daily with a meal.    metoprolol  succinate 25 MG 24 hr tablet Commonly known as: TOPROL -XL TAKE 1 TABLET BY MOUTH ONCE DAILY   multivitamin with minerals Tabs tablet Take 1 tablet by mouth daily.   Vitamin D  50 MCG (2000 UT) Caps Give one capsule by mouth once daily .        Allergies:  Allergies  Allergen Reactions   Morphine  Sulfate Other (See Comments)    BP bottoms out   Penicillins Diarrhea    Has patient had a PCN reaction causing immediate rash, facial/tongue/throat swelling, SOB or lightheadedness with hypotension: Yes Has patient  had a PCN reaction causing severe rash involving mucus membranes or skin necrosis: Yes Has patient had a PCN reaction that required hospitalization No Has patient had a PCN reaction occurring within the last 10 years: No If all of the above answers are NO, then may proceed with Cephalosporin use.    Family History: Family History  Problem Relation Age of Onset   Hypertension Mother    Diabetes Mother    Lung cancer Father     Social History:  reports that she quit smoking about 45 years ago. Her smoking use included cigarettes. She has never used smokeless tobacco. She reports that she does not currently use alcohol. She reports that she does not use drugs.  ROS:                                        Physical Exam: There were no vitals taken for this visit.  Constitutional:  Alert and oriented, No acute distress. HEENT: [place] AT, moist mucus membranes.  Trachea midline, no masses.   Laboratory Data: Lab Results  Component Value Date   WBC 6.1 04/27/2023   HGB 12.4 04/27/2023   HCT 37 04/27/2023   MCV 88.3 05/14/2022   PLT 237 04/27/2023    Lab Results  Component Value Date   CREATININE 1.50 (H) 10/23/2023    No results found for: PSA  No results found for: TESTOSTERONE  Lab Results  Component Value Date   HGBA1C 8.1 06/11/2023    Urinalysis    Component Value Date/Time   COLORURINE YELLOW (A)  05/09/2022 1240   APPEARANCEUR HAZY (A) 05/09/2022 1240   APPEARANCEUR Clear 11/21/2015 0919   LABSPEC 1.022 05/09/2022 1240   PHURINE 5.0 05/09/2022 1240   GLUCOSEU >=500 (A) 05/09/2022 1240   HGBUR NEGATIVE 05/09/2022 1240   BILIRUBINUR NEGATIVE 05/09/2022 1240   BILIRUBINUR neg 12/05/2019 1206   BILIRUBINUR Negative 11/21/2015 0919   KETONESUR 20 (A) 05/09/2022 1240   PROTEINUR 30 (A) 05/09/2022 1240   UROBILINOGEN 0.2 12/05/2019 1206   NITRITE NEGATIVE 05/09/2022 1240   LEUKOCYTESUR NEGATIVE 05/09/2022 1240    Pertinent Imaging:   Assessment & Plan: Picture drawn.  Conservative therapy recommended.  In my opinion she is not a good surgical candidate.  Having said that I think is best to reexamine her still in a few months and perform a safety cystoscopy.  This will be arranged.  It will also help clear her for any issues with possible hematuria   1. Hematuria, unspecified type (Primary)  - Urinalysis, Complete   No follow-ups on file.  Glendia DELENA Elizabeth, MD  [facility] [address], [address] [place], [place] [address] [phone]

## 2023-10-26 NOTE — Patient Instructions (Signed)

## 2023-10-27 ENCOUNTER — Non-Acute Institutional Stay (SKILLED_NURSING_FACILITY): Admitting: Nurse Practitioner

## 2023-10-27 ENCOUNTER — Encounter: Payer: Self-pay | Admitting: Nurse Practitioner

## 2023-10-27 DIAGNOSIS — Z0000 Encounter for general adult medical examination without abnormal findings: Secondary | ICD-10-CM | POA: Diagnosis not present

## 2023-10-27 NOTE — Progress Notes (Signed)
 Subjective:   [name] is a 89 y.o. female who presents for Medicare Annual (Subsequent) preventive examination.  Visit Complete: In person SNF TL    Cardiac Risk Factors include: advanced age (>34men, >75 women);hypertension;diabetes mellitus;dyslipidemia;sedentary lifestyle;obesity (BMI >30kg/m2)     Objective:    Today's Vitals   10/27/23 1143  BP: 100/62  Pulse: 76  Resp: 18  Temp: (!) 97.2 F (36.2 C)  SpO2: 97%  Weight: 159 lb 1.6 oz (72.2 kg)  Height: 5' 1 (1.549 m)   Body mass index is 30.06 kg/m.     10/08/2023    8:36 AM 07/29/2023   11:13 AM 04/24/2023    4:34 PM 03/30/2023    1:02 AM 11/26/2022    1:15 PM 11/17/2022    2:34 PM 09/18/2022   10:52 AM  Advanced Directives  Does Patient Have a Medical Advance Directive? Yes Yes Yes No Yes Yes Yes  Type of Advance Directive Out of facility DNR (pink MOST or yellow form) Out of facility DNR (pink MOST or yellow form) Out of facility DNR (pink MOST or yellow form)  Out of facility DNR (pink MOST or yellow form) Out of facility DNR (pink MOST or yellow form) Out of facility DNR (pink MOST or yellow form)  Does patient want to make changes to medical advance directive? No - Patient declined No - Patient declined No - Patient declined  No - Patient declined No - Patient declined No - Patient declined  Pre-existing out of facility DNR order (yellow form or pink MOST form)      Yellow form placed in chart (order not valid for inpatient use)     Current Medications (verified) Outpatient Encounter Medications as of 10/27/2023  Medication Sig   acetaminophen  (TYLENOL ) 325 MG tablet Take 650 mg by mouth 2 (two) times daily. Give 650mg  by mouth every 8 hours as needed (Patient taking differently: Take 650 mg by mouth in the morning, at noon, and at bedtime.)   apixaban  (ELIQUIS ) 5 MG TABS tablet Take 1 tablet (5 mg total) by mouth 2 (two) times daily.   ARTIFICIAL TEAR SOLUTION OP Apply 1 drop to eye 4 (four) times  daily.   bismuth subsalicylate (PEPTO BISMOL) 262 MG/15ML suspension Take 30 mLs by mouth every 4 (four) hours as needed.   Cholecalciferol  (VITAMIN D ) 50 MCG (2000 UT) CAPS Give one capsule by mouth once daily .   Clobetasol  Propionate 0.05 % shampoo Apply 1 Application topically every morning. And wash out. Tues,Fri   empagliflozin  (JARDIANCE ) 10 MG TABS tablet Take 1 tablet (10 mg total) by mouth daily before breakfast.   Ensure (ENSURE) Take 237 mLs by mouth as needed (For weight loss or decreased intake).   ezetimibe  (ZETIA ) 10 MG tablet Take 1 tablet (10 mg total) by mouth daily.   fluocinolone  (SYNALAR ) 0.01 % external solution Apply 1 Application topically at bedtime. Apply to scalp   fluticasone  (FLONASE ) 50 MCG/ACT nasal spray Place 2 sprays into both nostrils daily.   furosemide  (LASIX ) 20 MG tablet Take 20 mg by mouth daily. Give one tablet by mouth every 24 hours as needed if 3lbs or greater weight gain in 24hrs.   glipiZIDE  (GLUCOTROL ) 10 MG tablet Take 10 mg by mouth 2 (two) times daily before a meal.   Infant Care Products (DERMACLOUD EX) Apply to buttocks/periarea topically every shift   linagliptin  (TRADJENTA ) 5 MG TABS tablet Take 5 mg by mouth daily.   lisinopril  (ZESTRIL ) 10 MG  tablet Take 10 mg by mouth daily.   metFORMIN  (GLUCOPHAGE -XR) 500 MG 24 hr tablet Take 1,000 mg by mouth 2 (two) times daily with a meal.   metoprolol  succinate (TOPROL -XL) 25 MG 24 hr tablet TAKE 1 TABLET BY MOUTH ONCE DAILY   Multiple Vitamin (MULTIVITAMIN WITH MINERALS) TABS tablet Take 1 tablet by mouth daily.   Throat Lozenges (COUGH DROPS) LOZG Use as directed 1 lozenge in the mouth or throat as needed.   Wheat Dextrin (BENEFIBER PO) Take 1 each by mouth daily.   Guaifenesin  200 MG/5ML LIQD Take 5 mLs by mouth every 6 (six) hours as needed. (Patient not taking: Reported on 10/27/2023)   lisinopril  (ZESTRIL ) 20 MG tablet TAKE 1 TABLET BY MOUTH DAILY (Patient not taking: Reported on 10/27/2023)    No facility-administered encounter medications on file as of 10/27/2023.    Allergies (verified) Morphine  sulfate and Penicillins   History: Past Medical History:  Diagnosis Date   Acute pancreatitis 07/24/2021   Basal cell carcinoma 03/30/2008   left upper arm/excision   Basal cell carcinoma 03/31/2008   left upper arm, right lower leg   Basal cell carcinoma 01/18/2009   left upper arm   Collagenous colitis    Diabetes mellitus without complication (HCC)    Diverticulitis    Elevated LFTs 07/25/2021   Heart murmur    at birth, none since   History of echocardiogram    a. 11/2015: echo showing EF of 60-65% with no WMA. Grade 1 DD and mild MR noted.    Hyperlipidemia    Per [facility]   Hypertension    Intertrochanteric fracture of right femur (HCC) 06/11/2019   Lack of coordination    Per [facility] term current use of anticoagulant    Per [facility]   Noninfective gastroenteritis and colitis, unspecified    Per [facility]   PAF (paroxysmal atrial fibrillation) (HCC)    a. initially diagnosed in 08/2016 --> started on Eliquis    Squamous cell carcinoma of skin 05/16/2020   Left nasal tip anterior to ala, refer to [facility]   Syncope    a. initially occurring in Fall 2016 b. Hospitalized in 10/2015 and 11/2015 for recurrent episodes.    Past Surgical History:  Procedure Laterality Date   ABDOMINAL HYSTERECTOMY     BACK SURGERY  08/08/2008   Lumbar discectomy   COLONOSCOPY WITH PROPOFOL      EXCISION/RELEASE BURSA HIP Left 05/03/2019   Procedure: HIP ABDUCTOR REPAIR;  Surgeon: Kathlynn Sharper, MD;  Location: [facility] ORS;  Service: Orthopedics;  Laterality: Left;   INTRAMEDULLARY (IM) NAIL INTERTROCHANTERIC Right 06/13/2019   Procedure: INTRAMEDULLARY (IM) NAIL INTERTROCHANTRIC;  Surgeon: Kathlynn Sharper, MD;  Location: [facility] ORS;  Service: Orthopedics;  Laterality: Right;   NECK SURGERY  1980   Family History  Problem Relation Age of Onset   Hypertension Mother     Diabetes Mother    Lung cancer Father    Social History   Socioeconomic History   Marital status: Widowed    Spouse name: Not on file   Number of children: 3   Years of education: Not on file   Highest education level: Associate degree: academic program  Occupational History   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  Tobacco Use   Smoking status: Former    Current packs/day: 0.00    Types: Cigarettes    Quit date: 03/09/1978    Years since quitting: 45.6   Smokeless tobacco: Never  Vaping Use  Vaping status: Never Used  Substance and Sexual Activity   Alcohol use: Not Currently    Comment: once a month--mixed drink   Drug use: No   Sexual activity: Not on file  Other Topics Concern   Not on file  Social History Narrative   2 daughters and a son (1 local plus [place] and [place])      Has living will    Daughter Marval is [facility] POA   Has DNR   No feeding tube   Social Drivers of Health   Financial Resource Strain: Low Risk  (05/08/2023)   Received from [facility]   Overall Financial Resource Strain (CARDIA)    Difficulty of Paying Living Expenses: Not hard at all  Food Insecurity: No Food Insecurity (05/08/2023)   [facility]   Hunger Vital Sign    Within the past 12 months, you worried that your food would run out before you got the money to buy more.: Never true    Within the past 12 months, the food you bought just didn't last and you didn't have money to get more.: Never true  Transportation Needs: No Transportation Needs (05/08/2023)   Received from [facility] - Transportation    In the past 12 months, has lack of transportation kept you from medical appointments or from getting medications?: No    Lack of Transportation (Non-Medical): No  Physical Activity: Inactive (04/25/2020)   Exercise Vital Sign    Days of Exercise per Week: 0 days    Minutes of Exercise per Session: 0 min   Stress: No Stress Concern Present (04/25/2020)   [employer] of Occupational Health - Occupational Stress Questionnaire    Feeling of Stress : Not at all  Social Connections: Moderately Isolated (04/25/2020)   Social Connection and Isolation Panel    Frequency of Communication with Friends and Family: More than three times a week    Frequency of Social Gatherings with Friends and Family: More than three times a week    Attends Religious Services: More than 4 times per year    Active Member of [employer] or Organizations: No    Attends Banker Meetings: Never    Marital Status: Widowed    Tobacco Counseling Counseling given: Not Answered   Clinical Intake:  Pre-visit preparation completed: Yes  Pain : No/denies pain     BMI - recorded: 29 Nutritional Status: BMI 25 -29 Overweight Diabetes: Yes  How often do you need to have someone help you when you read instructions, pamphlets, or other written materials from your doctor or pharmacy?: 1 - Never         Activities of Daily Living    10/27/2023   10:23 AM  In your present state of health, do you have any difficulty performing the following activities:  Hearing? 0  Vision? 0  Difficulty concentrating or making decisions? 0  Walking or climbing stairs? 1  Dressing or bathing? 1  Doing errands, shopping? 1  Preparing Food and eating ? Y  Comment does not prepare food, no trouble eating  Using the Toilet? N  In the past six months, have you accidently leaked urine? N  Do you have problems with loss of bowel control? N  Managing your Medications? Y  Managing your Finances? Y  Housekeeping or managing your Housekeeping? Y    Patient Care Team: Abdul Fine, MD as PCP - General (Family Medicine)  Mady Bruckner, MD as PCP - Cardiology (Cardiology) Mittie Gaskin, MD as Referring Physician (Ophthalmology) Caro Harlene POUR, NP as Nurse Practitioner (Geriatric Medicine) Pa, [facility]  [facility])  Indicate any recent Medical Services you may have received from other than [facility] providers in the past year (date may be approximate).     Assessment:   This is a routine wellness examination for [name].  Hearing/Vision screen No results found.   Goals Addressed   None    Depression Screen    10/27/2023   10:25 AM 09/18/2022    9:32 AM 10/19/2020   11:03 AM 04/25/2020    2:37 PM 02/09/2020   10:39 AM 12/05/2019   12:12 PM 02/09/2018    1:40 PM  PHQ 2/9 Scores  PHQ - 2 Score 0 0 0 0 0 0 1  PHQ- 9 Score   2   3     Fall Risk    10/27/2023   10:25 AM 09/18/2022    9:32 AM 10/19/2020   11:04 AM 04/25/2020    2:39 PM 02/09/2020   10:39 AM  Fall Risk   Falls in the past year? 0 0 0 0 0  Number falls in past yr: 0  0 0 0  Injury with Fall? 0  0 0 0  Risk for fall due to : Impaired balance/gait;Impaired mobility  Impaired balance/gait    Follow up   Falls evaluation completed   Falls evaluation completed      Data saved with a previous flowsheet row definition    MEDICARE RISK AT HOME: Medicare Risk at Home Any stairs in or around the home?: No Home free of loose throw rugs in walkways, pet beds, electrical cords, etc?: Yes Adequate lighting in your home to reduce risk of falls?: Yes Life alert?: No Use of a cane, walker or w/c?: Yes Grab bars in the bathroom?: Yes Shower chair or bench in shower?: Yes Elevated toilet seat or a handicapped toilet?: Yes  TIMED UP AND GO:  Was the test performed?  No    Cognitive Function:        04/25/2020    3:42 PM 02/09/2018    1:48 PM  6CIT Screen  What Year? 0 points 0 points  What month? 0 points 0 points  What time? 0 points 0 points  Count back from 20 0 points 0 points  Months in reverse 0 points 0 points  Repeat phrase 4 points 4 points  Total Score 4 points 4 points    Immunizations Immunization History  Administered Date(s) Administered   Fluad Quad(high Dose 65+) 12/20/2018   Influenza Split  01/01/2009, 01/02/2010, 01/13/2011, 12/22/2011   Influenza, High Dose Seasonal PF 01/18/2014, 01/01/2015, 11/30/2015, 12/01/2016, 01/11/2018, 12/08/2019, 12/26/2022   Influenza-Unspecified 01/08/2013, 11/08/2020, 12/25/2021   Moderna Covid-19 Fall Seasonal Vaccine 25yrs & older 06/17/2022, 12/05/2022   Moderna Covid-19 Vaccine Bivalent Booster 50yrs & up 11/30/2020, 08/06/2021, 01/17/2022   PFIZER Comirnaty(Gray Top)Covid-19 Tri-Sucrose Vaccine 07/27/2020   PFIZER(Purple Top)SARS-COV-2 Vaccination 04/01/2019, 04/22/2019, 12/05/2019, 07/27/2020   Pfizer Covid-19 Vaccine Bivalent Booster 34yrs & up 11/30/2020   Pneumococcal Conjugate-13 10/19/2013   Pneumococcal Polysaccharide-23 01/02/2010   Td 11/02/2003, 01/13/2011   Tdap 01/13/2011, 02/28/2015   Unspecified SARS-COV-2 Vaccination 06/19/2023   Zoster Recombinant(Shingrix) 08/11/2022, 11/25/2022   Zoster, Live 07/14/2007    TDAP status: Up to date  Flu Vaccine status: Due, Education has been provided regarding the importance of this vaccine. Advised may receive this vaccine at local pharmacy or  Health Dept. Aware to provide a copy of the vaccination record if obtained from local pharmacy or Health Dept. Verbalized acceptance and understanding.  Pneumococcal vaccine status: Up to date  Covid-19 vaccine status: Information provided on how to obtain vaccines.   Qualifies for Shingles Vaccine? Yes   Zostavax completed No   Shingrix Completed?: Yes  Screening Tests Health Maintenance  Topic Date Due   INFLUENZA VACCINE  12/09/2023 (Originally 10/09/2023)   COVID-19 Vaccine (12 - 2024-25 season) 06/08/2024 (Originally 08/14/2023)   HEMOGLOBIN A1C  12/11/2023   OPHTHALMOLOGY EXAM  05/03/2024   FOOT EXAM  08/25/2024   Medicare Annual Wellness (AWV)  10/26/2024   DTaP/Tdap/Td (5 - Td or Tdap) 02/27/2025   Pneumococcal Vaccine: 50+ Years  Completed   DEXA SCAN  Completed   Zoster Vaccines- Shingrix  Completed   HPV VACCINES  Aged Out    Meningococcal B Vaccine  Aged Out    Health Maintenance  There are no preventive care reminders to display for this patient.   Colorectal cancer screening: No longer required.   Mammogram status: No longer required due to age.   Lung Cancer Screening: (Low Dose CT Chest recommended if Age 21-80 years, 20 pack-year currently smoking OR have quit w/in 15years.) does not qualify.   Additional Screening:  Hepatitis C Screening: does not qualify; Completed na  Vision Screening: Recommended annual ophthalmology exams for early detection of glaucoma and other disorders of the eye. Is the patient up to date with their annual eye exam?  Yes  Who is the provider or what is the name of the office in which the patient attends annual eye exams? [facility] If pt is not established with a provider, would they like to be referred to a provider to establish care? No .   Dental Screening: Recommended annual dental exams for proper oral hygiene  Diabetic Foot Exam: Diabetic Foot Exam: Completed 08/26/2023  Community Resource Referral / Chronic Care Management: CRR required this visit?  No   CCM required this visit?  No     Plan:     I have personally reviewed and noted the following in the patient's chart:   Medical and social history Use of alcohol, tobacco or illicit drugs  Current medications and supplements including opioid prescriptions. Patient is not currently taking opioid prescriptions. Functional ability and status Nutritional status Physical activity Advanced directives List of other physicians Hospitalizations, surgeries, and ER visits in previous 12 months Vitals Screenings to include cognitive, depression, and falls Referrals and appointments  In addition, I have reviewed and discussed with patient certain preventive protocols, quality metrics, and best practice recommendations. A written personalized care plan for preventive services as well as general preventive  health recommendations were provided to patient.     Harlene MARLA An, NP   10/27/2023

## 2023-11-30 ENCOUNTER — Encounter: Payer: Self-pay | Admitting: Ophthalmology

## 2023-12-04 NOTE — Discharge Instructions (Signed)

## 2023-12-07 ENCOUNTER — Encounter: Payer: Self-pay | Admitting: Ophthalmology

## 2023-12-07 NOTE — Anesthesia Preprocedure Evaluation (Signed)
 Anesthesia Evaluation  Patient identified by MRN, date of birth, ID band Patient awake    Reviewed: Allergy & Precautions, H&P , NPO status , Patient's Chart, lab work & pertinent test results  Airway Mallampati: III  TM Distance: >3 FB Neck ROM: Full    Dental  (+) Caps   Pulmonary neg pulmonary ROS, COPD, former smoker   Pulmonary exam normal breath sounds clear to auscultation       Cardiovascular hypertension, + Peripheral Vascular Disease  negative cardio ROS Normal cardiovascular exam+ Valvular Problems/Murmurs  Rhythm:Regular Rate:Normal  05-10-22 echo  1. Left ventricular ejection fraction, by estimation, is 55 to 60%. The  left ventricle has normal function. The left ventricle has no regional  wall motion abnormalities. There is mild left ventricular hypertrophy.  Left ventricular diastolic parameters  are indeterminate.   2. Right ventricular systolic function is normal. The right ventricular  size is normal.   3. Right atrial size was mildly dilated.   4. The mitral valve is normal in structure. No evidence of mitral valve  regurgitation.   5. The aortic valve was not well visualized. Aortic valve regurgitation  is not visualized. No aortic stenosis is present.   6. The inferior vena cava is normal in size with greater than 50%  respiratory variability, suggesting right atrial pressure of 3 mmHg.   Hx atrial fibrillation   Neuro/Psych  PSYCHIATRIC DISORDERS     Dementia negative neurological ROS  negative psych ROS   GI/Hepatic negative GI ROS, Neg liver ROS,,,  Endo/Other  negative endocrine ROSdiabetes    Renal/GU negative Renal ROS  negative genitourinary   Musculoskeletal negative musculoskeletal ROS (+) Arthritis ,    Abdominal   Peds negative pediatric ROS (+)  Hematology negative hematology ROS (+)   Anesthesia Other Findings Diabetes mellitus without complication  (HCC) Hypertension Diverticulitis Syncope History of echocardiogram  PAF (paroxysmal atrial fibrillation) (HCC) Collagenous colitis  Heart murmur Basal cell carcinoma  Basal cell carcinoma Basal cell carcinoma  Squamous cell carcinoma of skin Intertrochanteric fracture of right femur (HCC) Hyperlipidemia Lack of coordination Noninfective gastroenteritis and colitis, unspecified Long term current use of anticoagulant Acute pancreatitis Elevated LFTs COPD (chronic obstructive pulmonary disease) (HCC) Arthritis  Dementia (HCC) Chronic heart failure with preserved ejection fraction (HCC)  Aortic atherosclerosis Centrilobular emphysema (HCC)      Reproductive/Obstetrics negative OB ROS                              Anesthesia Physical Anesthesia Plan  ASA: 3  Anesthesia Plan: MAC   Post-op Pain Management:    Induction: Intravenous  PONV Risk Score and Plan:   Airway Management Planned: Natural Airway and Nasal Cannula  Additional Equipment:   Intra-op Plan:   Post-operative Plan:   Informed Consent: I have reviewed the patients History and Physical, chart, labs and discussed the procedure including the risks, benefits and alternatives for the proposed anesthesia with the patient or authorized representative who has indicated his/her understanding and acceptance.     Dental Advisory Given  Plan Discussed with: Anesthesiologist, CRNA and Surgeon  Anesthesia Plan Comments: (Patient consented for risks of anesthesia including but not limited to:  - adverse reactions to medications - damage to eyes, teeth, lips or other oral mucosa - nerve damage due to positioning  - sore throat or hoarseness - Damage to heart, brain, nerves, lungs, other parts of body or loss of life  Patient  voiced understanding and assent.)         Anesthesia Quick Evaluation

## 2023-12-08 ENCOUNTER — Encounter: Payer: Self-pay | Admitting: Ophthalmology

## 2023-12-08 ENCOUNTER — Ambulatory Visit
Admission: RE | Admit: 2023-12-08 | Discharge: 2023-12-08 | Disposition: A | Discharge status: Home / Self Care | Attending: Ophthalmology | Admitting: Ophthalmology

## 2023-12-08 ENCOUNTER — Other Ambulatory Visit: Payer: Self-pay

## 2023-12-08 ENCOUNTER — Ambulatory Visit: Payer: Self-pay | Admitting: Anesthesiology

## 2023-12-08 ENCOUNTER — Encounter
Admission: RE | Disposition: A | Discharge status: Home / Self Care | Payer: Self-pay | Source: Home / Self Care | Attending: Ophthalmology

## 2023-12-08 DIAGNOSIS — I480 Paroxysmal atrial fibrillation: Secondary | ICD-10-CM | POA: Insufficient documentation

## 2023-12-08 DIAGNOSIS — H2512 Age-related nuclear cataract, left eye: Secondary | ICD-10-CM | POA: Insufficient documentation

## 2023-12-08 DIAGNOSIS — Z7901 Long term (current) use of anticoagulants: Secondary | ICD-10-CM | POA: Diagnosis not present

## 2023-12-08 DIAGNOSIS — M1990 Unspecified osteoarthritis, unspecified site: Secondary | ICD-10-CM | POA: Insufficient documentation

## 2023-12-08 DIAGNOSIS — I110 Hypertensive heart disease with heart failure: Secondary | ICD-10-CM | POA: Diagnosis not present

## 2023-12-08 DIAGNOSIS — E1151 Type 2 diabetes mellitus with diabetic peripheral angiopathy without gangrene: Secondary | ICD-10-CM | POA: Insufficient documentation

## 2023-12-08 DIAGNOSIS — Z7984 Long term (current) use of oral hypoglycemic drugs: Secondary | ICD-10-CM | POA: Diagnosis not present

## 2023-12-08 DIAGNOSIS — J432 Centrilobular emphysema: Secondary | ICD-10-CM | POA: Diagnosis not present

## 2023-12-08 DIAGNOSIS — I5032 Chronic diastolic (congestive) heart failure: Secondary | ICD-10-CM | POA: Insufficient documentation

## 2023-12-08 DIAGNOSIS — Z833 Family history of diabetes mellitus: Secondary | ICD-10-CM | POA: Insufficient documentation

## 2023-12-08 DIAGNOSIS — Z79899 Other long term (current) drug therapy: Secondary | ICD-10-CM | POA: Diagnosis not present

## 2023-12-08 DIAGNOSIS — E785 Hyperlipidemia, unspecified: Secondary | ICD-10-CM | POA: Insufficient documentation

## 2023-12-08 DIAGNOSIS — Z87891 Personal history of nicotine dependence: Secondary | ICD-10-CM | POA: Diagnosis not present

## 2023-12-08 DIAGNOSIS — E1136 Type 2 diabetes mellitus with diabetic cataract: Secondary | ICD-10-CM | POA: Diagnosis not present

## 2023-12-08 HISTORY — DX: Unspecified dementia, unspecified severity, without behavioral disturbance, psychotic disturbance, mood disturbance, and anxiety: F03.90

## 2023-12-08 HISTORY — DX: Atherosclerosis of aorta: I70.0

## 2023-12-08 HISTORY — DX: Unspecified osteoarthritis, unspecified site: M19.90

## 2023-12-08 HISTORY — DX: Chronic obstructive pulmonary disease, unspecified: J44.9

## 2023-12-08 HISTORY — DX: Chronic diastolic (congestive) heart failure: I50.32

## 2023-12-08 HISTORY — DX: Centrilobular emphysema: J43.2

## 2023-12-08 HISTORY — PX: CATARACT EXTRACTION W/PHACO: SHX586

## 2023-12-08 LAB — GLUCOSE, CAPILLARY: Glucose-Capillary: 136 mg/dL — ABNORMAL HIGH (ref 70–99)

## 2023-12-08 SURGERY — PHACOEMULSIFICATION, CATARACT, WITH IOL INSERTION
Anesthesia: Monitor Anesthesia Care | Site: Eye | Laterality: Left

## 2023-12-08 MED ORDER — TETRACAINE HCL 0.5 % OP SOLN
1.0000 [drp] | OPHTHALMIC | Status: DC | PRN
  Administered 2023-12-08 (×3): 1 [drp] via OPHTHALMIC

## 2023-12-08 MED ORDER — LIDOCAINE HCL (PF) 2 % IJ SOLN
INTRAOCULAR | Status: DC | PRN
  Administered 2023-12-08: 2 mL

## 2023-12-08 MED ORDER — FENTANYL CITRATE (PF) 100 MCG/2ML IJ SOLN
INTRAMUSCULAR | Status: AC
  Filled 2023-12-08: qty 2

## 2023-12-08 MED ORDER — TETRACAINE HCL 0.5 % OP SOLN
OPHTHALMIC | Status: AC
  Filled 2023-12-08: qty 4

## 2023-12-08 MED ORDER — MOXIFLOXACIN HCL 0.5 % OP SOLN
OPHTHALMIC | Status: DC | PRN
  Administered 2023-12-08: .2 mL via OPHTHALMIC

## 2023-12-08 MED ORDER — BRIMONIDINE TARTRATE-TIMOLOL 0.2-0.5 % OP SOLN
OPHTHALMIC | Status: DC | PRN
  Administered 2023-12-08: 1 [drp] via OPHTHALMIC

## 2023-12-08 MED ORDER — SIGHTPATH DOSE#1 BSS IO SOLN
INTRAOCULAR | Status: DC | PRN
  Administered 2023-12-08: 15 mL via INTRAOCULAR

## 2023-12-08 MED ORDER — SIGHTPATH DOSE#1 BSS IO SOLN
INTRAOCULAR | Status: DC | PRN
  Administered 2023-12-08: 70 mL via OPHTHALMIC

## 2023-12-08 MED ORDER — FENTANYL CITRATE (PF) 100 MCG/2ML IJ SOLN
INTRAMUSCULAR | Status: DC | PRN
  Administered 2023-12-08 (×2): 25 ug via INTRAVENOUS

## 2023-12-08 MED ORDER — SIGHTPATH DOSE#1 NA CHONDROIT SULF-NA HYALURON 40-17 MG/ML IO SOLN
INTRAOCULAR | Status: DC | PRN
  Administered 2023-12-08: 1 mL via INTRAOCULAR

## 2023-12-08 MED ORDER — ARMC OPHTHALMIC DILATING DROPS
1.0000 | OPHTHALMIC | Status: DC | PRN
  Administered 2023-12-08 (×3): 1 via OPHTHALMIC

## 2023-12-08 MED ORDER — ARMC OPHTHALMIC DILATING DROPS
OPHTHALMIC | Status: AC
  Filled 2023-12-08: qty 0.5

## 2023-12-08 MED ORDER — LACTATED RINGERS IV SOLN: INTRAVENOUS | Status: DC

## 2023-12-08 SURGICAL SUPPLY — 11 items
CANNULA ANT/CHMB 27G (MISCELLANEOUS) ×1 IMPLANT
CYSTOTOME ANGL RVRS SHRT 25G (CUTTER) ×1 IMPLANT
FEE CATARACT SUITE SIGHTPATH (MISCELLANEOUS) ×1 IMPLANT
GLOVE BIOGEL PI IND STRL 8 (GLOVE) ×1 IMPLANT
GLOVE SURG LX STRL 8.0 MICRO (GLOVE) ×1 IMPLANT
GLOVE SURG SYN 6.5 PF PI BL (GLOVE) ×1 IMPLANT
LENS IOL CLRN WGN WHL 21.0 (Intraocular Lens) IMPLANT
NDL FILTER BLUNT 18X1 1/2 (NEEDLE) ×1 IMPLANT
NEEDLE FILTER BLUNT 18X1 1/2 (NEEDLE) ×1 IMPLANT
RING MALYGIN (MISCELLANEOUS) IMPLANT
SYR 3ML LL SCALE MARK (SYRINGE) ×1 IMPLANT

## 2023-12-08 NOTE — H&P (Signed)
 [facility]   Primary Care Physician:  Abdul Fine, MD Ophthalmologist: Dr. Elsie Carmine  Pre-Procedure History & Physical: HPI:  [name] is a 89 y.o. female here for cataract surgery.   Past Medical History:  Diagnosis Date   Acute pancreatitis 07/24/2021   Aortic atherosclerosis    Arthritis    Basal cell carcinoma 03/30/2008   left upper arm/excision   Basal cell carcinoma 03/31/2008   left upper arm, right lower leg   Basal cell carcinoma 01/18/2009   left upper arm   Centrilobular emphysema (HCC)    Chronic heart failure with preserved ejection fraction (HCC)    Collagenous colitis    COPD (chronic obstructive pulmonary disease) (HCC)    Dementia (HCC)    Diabetes mellitus without complication (HCC)    Diverticulitis    Elevated LFTs 07/25/2021   Heart murmur    at birth, none since   History of echocardiogram    a. 11/2015: echo showing EF of 60-65% with no WMA. Grade 1 DD and mild MR noted.    Hyperlipidemia    Per [facility] [place]   Hypertension    Intertrochanteric fracture of right femur (HCC) 06/11/2019   Lack of coordination    Per [facility] term current use of anticoagulant    Per [facility]   Noninfective gastroenteritis and colitis, unspecified    Per [facility]   PAF (paroxysmal atrial fibrillation) (HCC)    a. initially diagnosed in 08/2016 --> started on Eliquis    Squamous cell carcinoma of skin 05/16/2020   Left nasal tip anterior to ala, refer to [facility]   Syncope    a. initially occurring in Fall 2016 b. Hospitalized in 10/2015 and 11/2015 for recurrent episodes.     Past Surgical History:  Procedure Laterality Date   ABDOMINAL HYSTERECTOMY     BACK SURGERY  08/08/2008   Lumbar discectomy   COLONOSCOPY WITH PROPOFOL      EXCISION/RELEASE BURSA HIP Left 05/03/2019   Procedure: HIP ABDUCTOR REPAIR;  Surgeon: Kathlynn Sharper, MD;  Location: [facility] ORS;  Service: Orthopedics;  Laterality: Left;   INTRAMEDULLARY (IM) NAIL  INTERTROCHANTERIC Right 06/13/2019   Procedure: INTRAMEDULLARY (IM) NAIL INTERTROCHANTRIC;  Surgeon: Kathlynn Sharper, MD;  Location: [facility] ORS;  Service: Orthopedics;  Laterality: Right;   NECK SURGERY  1980    Prior to Admission medications   Medication Sig Start Date End Date Taking? Authorizing Provider  apixaban  (ELIQUIS ) 5 MG TABS tablet Take 1 tablet (5 mg total) by mouth 2 (two) times daily. 04/18/22  Yes Gonfa, Taye T, MD  ARTIFICIAL TEAR SOLUTION OP Apply 1 drop to eye 4 (four) times daily.   Yes [provider]  bismuth subsalicylate (PEPTO BISMOL) 262 MG/15ML suspension Take 30 mLs by mouth every 4 (four) hours as needed.   Yes [provider]  Cholecalciferol  (VITAMIN D ) 50 MCG (2000 UT) CAPS Give one capsule by mouth once daily .   Yes [provider]  Clobetasol  Propionate 0.05 % shampoo Apply 1 Application topically every morning. And wash out. Tues,Fri   Yes [provider]  empagliflozin  (JARDIANCE ) 10 MG TABS tablet Take 1 tablet (10 mg total) by mouth daily before breakfast. 02/10/23  Yes Eubanks, Jessica K, NP  ezetimibe  (ZETIA ) 10 MG tablet Take 1 tablet (10 mg total) by mouth daily. 02/10/23  Yes Eubanks, Jessica K, NP  fluocinolone  (SYNALAR ) 0.01 % external solution Apply 1 Application topically at bedtime. Apply to scalp  Yes [provider]  fluticasone  (FLONASE ) 50 MCG/ACT nasal spray Place 2 sprays into both nostrils daily.   Yes [provider]  furosemide  (LASIX ) 20 MG tablet Take 20 mg by mouth daily. Give one tablet by mouth every 24 hours as needed if 3lbs or greater weight gain in 24hrs.   Yes [provider]  glipiZIDE  (GLUCOTROL ) 10 MG tablet Take 10 mg by mouth 2 (two) times daily before a meal.   Yes [provider]  Guaifenesin  200 MG/5ML LIQD Take 5 mLs by mouth every 6 (six) hours as needed.   Yes [provider]  Infant Care Products [facility] EX) Apply to buttocks/periarea topically  every shift   Yes [provider]  linagliptin  (TRADJENTA ) 5 MG TABS tablet Take 5 mg by mouth daily.   Yes Abdul Fine, MD  lisinopril  (ZESTRIL ) 10 MG tablet Take 10 mg by mouth daily.   Yes [provider]  lisinopril  (ZESTRIL ) 20 MG tablet TAKE 1 TABLET BY MOUTH DAILY 10/14/21  Yes End, Lonni, MD  metFORMIN  (GLUCOPHAGE -XR) 500 MG 24 hr tablet Take 1,000 mg by mouth 2 (two) times daily with a meal.   Yes [provider]  metoprolol  succinate (TOPROL -XL) 25 MG 24 hr tablet TAKE 1 TABLET BY MOUTH ONCE DAILY 04/17/22  Yes End, Lonni, MD  Multiple Vitamin (MULTIVITAMIN WITH MINERALS) TABS tablet Take 1 tablet by mouth daily.   Yes [provider]  Throat Lozenges (COUGH DROPS) LOZG Use as directed 1 lozenge in the mouth or throat as needed.   Yes [provider]  Wheat Dextrin (BENEFIBER PO) Take 1 each by mouth daily.   Yes [provider]  acetaminophen  (TYLENOL ) 325 MG tablet Take 650 mg by mouth 2 (two) times daily. Give 650mg  by mouth every 8 hours as needed Patient taking differently: Take 650 mg by mouth in the morning, at noon, and at bedtime.    [provider]  Ensure (ENSURE) Take 237 mLs by mouth as needed (For weight loss or decreased intake).    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  Tobacco Use   Smoking status: Former    Current packs/day: 0.00    Types:  Cigarettes    Quit date: 03/09/1978    Years since quitting: 45.7   Smokeless tobacco: Never  Vaping Use   Vaping status: Never Used  Substance and Sexual Activity   Alcohol use: Not Currently    Comment: once a month--mixed drink   Drug use: Never   Sexual activity: Not on file  Other Topics Concern   Not on file  Social History Narrative   2 daughters and a son (1 local plus [place] and [place])      Has living will    Daughter Marval is [facility] POA   Has DNR   No feeding tube   Social Drivers  of Health   Financial Resource Strain: Low Risk  (05/08/2023)   Received from [facility]   Overall Financial Resource Strain (CARDIA)    Difficulty of Paying Living Expenses: Not hard at all  Food Insecurity: No Food Insecurity (05/08/2023)   Received from [facility]   Hunger Vital Sign    Within the past 12 months, you worried that your food would run out before you got the money to buy more.: Never true    Within the past 12 months, the food you bought just didn't last and you didn't have money to get more.: Never true  Transportation Needs: No Transportation Needs (05/08/2023)   Received from [facility] - Transportation    In the past 12 months, has lack of transportation kept you from medical appointments or from getting medications?: No    Lack of Transportation (Non-Medical): No  Physical Activity: Inactive (04/25/2020)   Exercise Vital Sign    Days of Exercise per Week: 0 days    Minutes of Exercise per Session: 0 min  Stress: No Stress Concern Present (04/25/2020)   [employer] of Occupational Health - Occupational Stress Questionnaire    Feeling of Stress : Not at all  Social Connections: Moderately Isolated (04/25/2020)   Social Connection and Isolation Panel    Frequency of Communication with Friends and Family: More than three times a week    Frequency of Social Gatherings with Friends and Family:  More than three times a week    Attends Religious Services: More than 4 times per year    Active Member of [employer] or Organizations: No    Attends Banker Meetings: Never    Marital Status: Widowed  Intimate Partner Violence: Not At Risk (05/11/2022)   Humiliation, Afraid, Rape, and Kick questionnaire    Fear of Current or Ex-Partner: No    Emotionally Abused: No    Physically Abused: No    Sexually Abused: No    Review of Systems: See HPI, otherwise negative ROS  Physical Exam: BP (!) 138/56   Pulse 74   Temp 97.8 F (36.6 C) (Temporal)   Resp 17   Ht 5' 3 (1.6 m)   Wt 81.6 kg   SpO2 100%   BMI 31.87 kg/m  General:   Alert, cooperative. Head:  Normocephalic and atraumatic. Respiratory:  Normal work of breathing. Cardiovascular:  NAD  Impression/Plan: [name] is here for cataract surgery.  Risks, benefits, limitations, and alternatives regarding cataract surgery have been reviewed with the patient.  Questions have been answered.  All parties agreeable.   Elsie Carmine, MD  12/08/2023, 9:18 AM

## 2023-12-08 NOTE — Op Note (Signed)
 PREOPERATIVE DIAGNOSIS:  Nuclear sclerotic cataract of the left eye.   POSTOPERATIVE DIAGNOSIS:  Nuclear sclerotic cataract of the left eye.   OPERATIVE PROCEDURE:ORPROCALL@   SURGEON:  [name] Carmine, MD.   ANESTHESIA:  Anesthesiologist: Ola Donny BROCKS, MD CRNA: Dave Maus, CRNA; Liliane Ports, CRNA  1.      Managed anesthesia care. 2.     0.58ml of Shugarcaine was instilled following the paracentesis   COMPLICATIONS:  None.   TECHNIQUE:   Stop and chop   DESCRIPTION OF PROCEDURE:  The patient was examined and consented in the preoperative holding area where the aforementioned topical anesthesia was applied to the left eye and then brought back to the Operating Room where the left eye was prepped and draped in the usual sterile ophthalmic fashion and a lid speculum was placed. A paracentesis was created with the side port blade and the anterior chamber was filled with viscoelastic. A near clear corneal incision was performed with the steel keratome. A continuous curvilinear capsulorrhexis was performed with a cystotome followed by the capsulorrhexis forceps. Hydrodissection and hydrodelineation were carried out with BSS on a blunt cannula. The lens was removed in a stop and chop  technique and the remaining cortical material was removed with the irrigation-aspiration handpiece. The capsular bag was inflated with viscoelastic and the intraocular lens was placed in the capsular bag without complication. The remaining viscoelastic was removed from the eye with the irrigation-aspiration handpiece. The wounds were hydrated. The anterior chamber was flushed with BSS and the eye was inflated to physiologic pressure. 0.30ml Vigamox was placed in the anterior chamber. The wounds were found to be water tight. The eye was dressed with Combigan. The patient was given protective glasses to wear throughout the day and a shield with which to sleep tonight. The patient was also given drops with which to  begin a drop regimen today and will follow-up with me in one day. Implant Name Type Inv. Item Serial No. Manufacturer Lot No. LRB No. Used Action  LENS IOL CLRN WGN WHL 21.0 - [id number] Intraocular Lens LENS IOL CLRN WGN WHL 21.0 [id number] [employer]  Left 1 Implanted    Procedure(s): PHACOEMULSIFICATION, CATARACT, WITH IOL  27.38 02:03.5 (Left)  Electronically signed: Elsie [name] 12/08/2023 9:47 AM

## 2023-12-08 NOTE — Anesthesia Postprocedure Evaluation (Signed)
 Anesthesia Post Note  [name]  Procedure(s) Performed: PHACOEMULSIFICATION, CATARACT, WITH IOL  27.38 02:03.5 (Left: Eye)  Patient location during evaluation: PACU Anesthesia Type: MAC Level of consciousness: awake and alert Pain management: pain level controlled Vital Signs Assessment: post-procedure vital signs reviewed and stable Respiratory status: spontaneous breathing, nonlabored ventilation, respiratory function stable and patient connected to nasal cannula oxygen Cardiovascular status: stable and blood pressure returned to baseline Postop Assessment: no apparent nausea or vomiting Anesthetic complications: no   No notable events documented.   Last Vitals:  Vitals:   12/08/23 0949 12/08/23 0955  BP:  (!) 127/57  Pulse: 61 62  Resp: 11 12  Temp: (!) 36.1 C   SpO2: 100% 98%    Last Pain:  Vitals:   12/08/23 0955  TempSrc:   PainSc: 0-No pain                 Venna Berberich C Mechille Varghese

## 2023-12-08 NOTE — Transfer of Care (Signed)
 Immediate Anesthesia Transfer of Care Note  [name]  Procedure(s) Performed: PHACOEMULSIFICATION, CATARACT, WITH IOL  27.38 02:03.5 (Left: Eye)  Patient Location: PACU  Anesthesia Type: MAC  Level of Consciousness: awake, alert  and patient cooperative  Airway and Oxygen Therapy: Patient Spontanous Breathing and Patient connected to supplemental oxygen  Post-op Assessment: Post-op Vital signs reviewed, Patient's Cardiovascular Status Stable, Respiratory Function Stable, Patent Airway and No signs of Nausea or vomiting  Post-op Vital Signs: Reviewed and stable  Complications: No notable events documented.

## 2023-12-11 LAB — CBC AND DIFFERENTIAL
HCT: 43 (ref 36–46)
Hemoglobin: 13.8 (ref 12.0–16.0)
Platelets: 322 K/uL (ref 150–400)
WBC: 7.4

## 2023-12-11 LAB — CBC: RBC: 4.58 (ref 3.87–5.11)

## 2023-12-13 ENCOUNTER — Encounter: Payer: Self-pay | Admitting: Nurse Practitioner

## 2023-12-13 NOTE — Progress Notes (Unsigned)
 Nurse reported the patient's hematuria continued but less blood seen. UA showed mixture of organisms, no UTI symptoms. Advised to hole Eliquis  and monitor s/s of UTI

## 2023-12-14 ENCOUNTER — Non-Acute Institutional Stay (SKILLED_NURSING_FACILITY): Payer: Self-pay | Admitting: Adult Health

## 2023-12-14 ENCOUNTER — Encounter: Payer: Self-pay | Admitting: Adult Health

## 2023-12-14 DIAGNOSIS — I10 Essential (primary) hypertension: Secondary | ICD-10-CM

## 2023-12-14 DIAGNOSIS — R319 Hematuria, unspecified: Secondary | ICD-10-CM | POA: Diagnosis not present

## 2023-12-14 DIAGNOSIS — I480 Paroxysmal atrial fibrillation: Secondary | ICD-10-CM

## 2023-12-14 NOTE — Progress Notes (Signed)
 Location:  Other [facility]) Nursing Home Room Number: [place] [address] [facility]) Place of Service:  SNF (31) Provider:  Medina-Vargas, Elohim Brune, DNP, FNP-BC  Patient Care Team: Abdul Fine, MD as PCP - General (Family Medicine) End, Lonni, MD as PCP - Cardiology (Cardiology) Mittie Gaskin, MD as Referring Physician (Ophthalmology) Caro Harlene POUR, NP as Nurse Practitioner (Geriatric Medicine) Pa, [facility])  Extended Emergency Contact Information Primary Emergency Contact: Bridgette Barnie DEL Address: [address]          [place], [place] [address] [place] Home Phone: [phone] Work Phone: [phone] Relation: Daughter  Code Status:  DNR  Goals of care: Advanced Directive information    12/14/2023   12:34 PM  Advanced Directives  Does Patient Have a Medical Advance Directive? Yes  Type of Advance Directive Out of facility DNR (pink MOST or yellow form)  Does patient want to make changes to medical advance directive? No - Patient declined  Would patient like information on creating a medical advance directive? No - Patient declined  Pre-existing out of facility DNR order (yellow form or pink MOST form) Yellow form placed in chart (order not valid for inpatient use)     Chief Complaint  Patient presents with   Rectal Bleeding     blood in diaper     HPI:  Pt is a 89 y.o. female seen today for an acute visit for blood in diaper. She is a resident of [facility]. She was noted to have bright red blood marking on her diaper 3 days ago. She is currently taking Eliquis  5 mg BID for atrial fibrillation. Urine culture done on 12/11/23 showed mixed genital flora, with urinalysis negative for nitrite, moderate bacteria and wbc 20-40. Patient denies abdominal pain. Labs done on 12/11/23 showed hgb 13.8, platelet 322.   3 doses of Eliquis  held over the weekend. No reported blood on diaper today.  BP 118/64,  takes lisinopril  and metoprolol  for hypertension.   Past Medical History:  Diagnosis Date   Acute pancreatitis 07/24/2021   Aortic atherosclerosis    Arthritis    Basal cell carcinoma 03/30/2008   left upper arm/excision   Basal cell carcinoma 03/31/2008   left upper arm, right lower leg   Basal cell carcinoma 01/18/2009   left upper arm   Centrilobular emphysema (HCC)    Chronic heart failure with preserved ejection fraction (HCC)    Collagenous colitis    COPD (chronic obstructive pulmonary disease) (HCC)    Dementia (HCC)    Diabetes mellitus without complication (HCC)    Diverticulitis    Elevated LFTs 07/25/2021   Heart murmur    at birth, none since   History of echocardiogram    a. 11/2015: echo showing EF of 60-65% with no WMA. Grade 1 DD and mild MR noted.    Hyperlipidemia    Per [facility]   Hypertension    Intertrochanteric fracture of right femur (HCC) 06/11/2019   Lack of coordination    Per [facility] term current use of anticoagulant    Per [facility]   Noninfective gastroenteritis and colitis, unspecified    Per [facility]   PAF (paroxysmal atrial fibrillation) (HCC)    a. initially diagnosed in 08/2016 --> started on Eliquis    Squamous cell carcinoma of skin 05/16/2020   Left nasal tip anterior to ala, refer to [facility]   Syncope    a. initially occurring in Fall 2016  b. Hospitalized in 10/2015 and 11/2015 for recurrent episodes.    Past Surgical History:  Procedure Laterality Date   ABDOMINAL HYSTERECTOMY     BACK SURGERY  08/08/2008   Lumbar discectomy   CATARACT EXTRACTION W/PHACO Left 12/08/2023   Procedure: PHACOEMULSIFICATION, CATARACT, WITH IOL  27.38 02:03.5;  Surgeon: Jaye Fallow, MD;  Location: [facility];  Service: Ophthalmology;  Laterality: Left;   COLONOSCOPY WITH PROPOFOL      EXCISION/RELEASE BURSA HIP Left 05/03/2019   Procedure: HIP ABDUCTOR REPAIR;  Surgeon: Kathlynn Sharper, MD;  Location: [facility];  Service:  Orthopedics;  Laterality: Left;   INTRAMEDULLARY (IM) NAIL INTERTROCHANTERIC Right 06/13/2019   Procedure: INTRAMEDULLARY (IM) NAIL INTERTROCHANTRIC;  Surgeon: Kathlynn Sharper, MD;  Location: [facility];  Service: Orthopedics;  Laterality: Right;   NECK SURGERY  1980    Allergies  Allergen Reactions   Morphine  Sulfate Other (See Comments)    BP bottoms out   Penicillins Diarrhea    Has patient had a PCN reaction causing immediate rash, facial/tongue/throat swelling, SOB or lightheadedness with hypotension: Yes Has patient had a PCN reaction causing severe rash involving mucus membranes or skin necrosis: Yes Has patient had a PCN reaction that required hospitalization No Has patient had a PCN reaction occurring within the last 10 years: No If all of the above answers are NO, then may proceed with Cephalosporin use.    Outpatient Encounter Medications as of 12/14/2023  Medication Sig   acetaminophen  (TYLENOL ) 325 MG tablet Take 650 mg by mouth 2 (two) times daily. Give 650mg  by mouth every 8 hours as needed (Patient taking differently: Take 650 mg by mouth in the morning, at noon, and at bedtime.)   apixaban  (ELIQUIS ) 5 MG TABS tablet Take 1 tablet (5 mg total) by mouth 2 (two) times daily.   ARTIFICIAL TEAR SOLUTION OP Apply 1 drop to eye 4 (four) times daily.   bismuth subsalicylate (PEPTO BISMOL) 262 MG/15ML suspension Take 30 mLs by mouth every 4 (four) hours as needed.   Cholecalciferol  (VITAMIN D ) 50 MCG (2000 UT) CAPS Give one capsule by mouth once daily .   Clobetasol  Propionate 0.05 % shampoo Apply 1 Application topically every morning. And wash out. Tues,Fri   empagliflozin  (JARDIANCE ) 10 MG TABS tablet Take 1 tablet (10 mg total) by mouth daily before breakfast.   ezetimibe  (ZETIA ) 10 MG tablet Take 1 tablet (10 mg total) by mouth daily.   fluocinolone  (SYNALAR ) 0.01 % external solution Apply 1 Application topically at bedtime. Apply to scalp   fluticasone  (FLONASE ) 50 MCG/ACT nasal  spray Place 2 sprays into both nostrils daily.   furosemide  (LASIX ) 20 MG tablet Take 20 mg by mouth daily. Give one tablet by mouth every 24 hours as needed if 3lbs or greater weight gain in 24hrs.   glipiZIDE  (GLUCOTROL ) 10 MG tablet Take 10 mg by mouth 2 (two) times daily before a meal.   Guaifenesin  200 MG/5ML LIQD Take 5 mLs by mouth every 6 (six) hours as needed.   linagliptin  (TRADJENTA ) 5 MG TABS tablet Take 5 mg by mouth daily.   lisinopril  (ZESTRIL ) 5 MG tablet Take 5 mg by mouth daily.   metFORMIN  (GLUCOPHAGE ) 1000 MG tablet Take 1,000 mg by mouth 2 (two) times daily.   metoprolol  succinate (TOPROL -XL) 25 MG 24 hr tablet TAKE 1 TABLET BY MOUTH ONCE DAILY   Multiple Vitamin (MULTIVITAMIN WITH MINERALS) TABS tablet Take 1 tablet by mouth daily.   prednisoLONE sodium phosphate  (INFLAMASE FORTE) 1 % ophthalmic  solution Place 1 drop into the left eye 4 (four) times daily. Instill 1 drop in left eye four times a day for post-op eye surgery Left eye until 12/14/2023   Throat Lozenges (COUGH DROPS) LOZG Use as directed 1 lozenge in the mouth or throat as needed.   Wheat Dextrin (BENEFIBER PO) Take 1 each by mouth daily.   ZINC OXIDE, TOPICAL, EX Apply 1 Application topically every 8 (eight) hours as needed (urethra prolapse).   [DISCONTINUED] lisinopril  (ZESTRIL ) 10 MG tablet Take 10 mg by mouth daily.   [DISCONTINUED] lisinopril  (ZESTRIL ) 20 MG tablet TAKE 1 TABLET BY MOUTH DAILY   [DISCONTINUED] metFORMIN  (GLUCOPHAGE -XR) 500 MG 24 hr tablet Take 1,000 mg by mouth 2 (two) times daily with a meal.   Ensure (ENSURE) Take 237 mLs by mouth as needed (For weight loss or decreased intake). (Patient not taking: Reported on 12/14/2023)   Infant Care Products [facility] EX) Apply to buttocks/periarea topically every shift (Patient not taking: Reported on 12/14/2023)   No facility-administered encounter medications on file as of 12/14/2023.    Review of Systems  Constitutional:  Negative for appetite  change, chills, fatigue and fever.  HENT:  Negative for congestion, hearing loss, rhinorrhea and sore throat.   Eyes: Negative.   Respiratory:  Negative for cough, shortness of breath and wheezing.   Cardiovascular:  Negative for chest pain, palpitations and leg swelling.  Gastrointestinal:  Negative for abdominal pain, constipation, diarrhea, nausea and vomiting.  Genitourinary:  Positive for hematuria. Negative for dysuria.  Musculoskeletal:  Negative for arthralgias, back pain and myalgias.  Skin:  Negative for color change, rash and wound.  Neurological:  Negative for dizziness, weakness and headaches.  Psychiatric/Behavioral:  Negative for behavioral problems. The patient is not nervous/anxious.        Immunization History  Administered Date(s) Administered   Fluad Quad(high Dose 65+) 12/20/2018   INFLUENZA, HIGH DOSE SEASONAL PF 01/18/2014, 01/01/2015, 11/30/2015, 12/01/2016, 01/11/2018, 12/08/2019, 12/26/2022   Influenza Split 01/01/2009, 01/02/2010, 01/13/2011, 12/22/2011   Influenza-Unspecified 01/08/2013, 11/08/2020, 12/25/2021   Moderna Covid-19 Fall Seasonal Vaccine 28yrs & older 06/17/2022, 12/05/2022   Moderna Covid-19 Vaccine Bivalent Booster 1yrs & up 11/30/2020, 08/06/2021, 01/17/2022   PFIZER Comirnaty(Gray Top)Covid-19 Tri-Sucrose Vaccine 07/27/2020   PFIZER(Purple Top)SARS-COV-2 Vaccination 04/01/2019, 04/22/2019, 12/05/2019, 07/27/2020   Pfizer Covid-19 Vaccine Bivalent Booster 55yrs & up 11/30/2020   Pneumococcal Conjugate-13 10/19/2013   Pneumococcal Polysaccharide-23 01/02/2010   Td 11/02/2003, 01/13/2011   Tdap 01/13/2011, 02/28/2015   Unspecified SARS-COV-2 Vaccination 06/19/2023   Zoster Recombinant(Shingrix) 08/11/2022, 11/25/2022   Zoster, Live 07/14/2007   Pertinent  Health Maintenance Due  Topic Date Due   Mammogram  05/07/2016   Influenza Vaccine  10/09/2023   HEMOGLOBIN A1C  12/11/2023   OPHTHALMOLOGY EXAM  05/03/2024   FOOT EXAM  10/29/2024    DEXA SCAN  Completed      07/24/2021   10:50 PM 07/25/2021   10:02 AM 07/26/2021    1:00 AM 09/18/2022    9:32 AM 10/27/2023   10:25 AM  Fall Risk  Falls in the past year?    0 0  Was there an injury with Fall?     0  Fall Risk Category Calculator     0  (RETIRED) Patient Fall Risk Level High fall risk  High fall risk  High fall risk     Patient at Risk for Falls Due to     Impaired balance/gait;Impaired mobility     Data saved with a previous flowsheet  row definition     Vitals:   12/14/23 1200  BP: 118/64  Pulse: 80  Resp: 20  Temp: 97.8 F (36.6 C)  SpO2: 97%  Weight: 162 lb 6.4 oz (73.7 kg)  Height: 5' 3 (1.6 m)   Body mass index is 28.77 kg/m.  Physical Exam Constitutional:      Appearance: Normal appearance.  HENT:     Head: Normocephalic and atraumatic.     Nose: Nose normal.     Mouth/Throat:     Mouth: Mucous membranes are moist.  Eyes:     Conjunctiva/sclera: Conjunctivae normal.  Cardiovascular:     Rate and Rhythm: Normal rate and regular rhythm.  Pulmonary:     Effort: Pulmonary effort is normal.     Breath sounds: Normal breath sounds.  Abdominal:     General: Bowel sounds are normal.     Palpations: Abdomen is soft.  Musculoskeletal:     Cervical back: Normal range of motion.  Skin:    General: Skin is warm and dry.  Neurological:     Mental Status: She is alert.  Psychiatric:        Mood and Affect: Mood normal.        Behavior: Behavior normal.      Labs reviewed: Recent Labs    04/27/23 0000 06/11/23 0000 10/23/23 1510  NA 141 141  --   K 4.3 4.3  --   CL 109* 106  --   CO2 22 27*  --   BUN 38* 25*  --   CREATININE 1.1 1.0 1.50*  CALCIUM  8.7 9.0  --    No results for input(s): AST, ALT, ALKPHOS, BILITOT, PROT, ALBUMIN in the last 8760 hours. Recent Labs    04/27/23 0000 10/19/23 0000  WBC 6.1 6.3  NEUTROABS 1,830.00 2,596.00  HGB 12.4 12.0  HCT 37 36  PLT 237 263   Lab Results  Component Value Date    TSH 2.284 04/17/2022   Lab Results  Component Value Date   HGBA1C 8.1 06/11/2023   Lab Results  Component Value Date   CHOL 241 (A) 11/20/2022   HDL 43 11/20/2022   LDLCALC 165 11/20/2022   TRIG 179 (A) 11/20/2022   CHOLHDL 5.2 05/10/2022    Significant Diagnostic Results in last 30 days:  No results found.  Assessment/Plan  1. PAF (paroxysmal atrial fibrillation) (HCC) (Primary) -  rate-controlled -  Eliquis  was held X 3 doses due to hematuria 3 days ago -  hgb 13.8 (12/11/23), no dropped in hgb and no hematuria today - Restart Eliquis  5 mg twice a day for anticoagulation - Continue metoprolol  succinate 25 mg daily for rate-control    2. Essential hypertension -  BP stable - Continue lisinopril  5 mg daily Continue metoprolol  succinate 25 mg daily-     3. Hematuria, unspecified type -Denies abdominal pain - Urine culture showed mixed genital flora -   No hematuria today -  Will recollect UA with CS      Family/ staff Communication: Discussed plan of care with resident and charge nurse.  Labs/tests ordered:  repeat UA with CS    Thaddaeus Granja Medina-Vargas, DNP, MSN, FNP-BC [facility] and [facility] Medicine [phone] (Monday-Friday 8:00 a.m. - 5:00 p.m.) [phone] (after hours)

## 2023-12-29 ENCOUNTER — Encounter: Payer: Self-pay | Admitting: Internal Medicine

## 2023-12-29 ENCOUNTER — Non-Acute Institutional Stay (SKILLED_NURSING_FACILITY): Payer: Self-pay | Admitting: Internal Medicine

## 2023-12-29 DIAGNOSIS — M546 Pain in thoracic spine: Secondary | ICD-10-CM | POA: Insufficient documentation

## 2023-12-29 NOTE — Assessment & Plan Note (Addendum)
 X-rays of Rabun taken.  Awaiting results.  Give her 1000 mg of Tylenol .  She is already on 650 mg of Tylenol  3 times a day.  She does not recall having a fall or any sort of trauma.  She has tenderness to palpation that is reproducible.  Likely this is musculoskeletal.  She denies any dysuria symptoms, fever, chills.  I do not think this is pyelonephritis.

## 2023-12-29 NOTE — Progress Notes (Signed)
 [facility] Acute Care Progress Note    Location:  Other [place] [facility].  Nursing Home Room Number: [employer] [id number] Place of Service:  SNF (31)   PCP: Laurence Locus, DO   Patient Care Team: Laurence Locus, DO as PCP - General (Internal Medicine) End, Lonni, MD as PCP - Cardiology (Cardiology) Mittie Gaskin, MD as Referring Physician (Ophthalmology) Caro Harlene POUR, NP as Nurse Practitioner (Geriatric Medicine) Pa, [facility])   Extended Emergency Contact Information Primary Emergency Contact: Bridgette Barnie DEL Address: [address]          [place], [place] [address] [place] Home Phone: [phone] Work Phone: [phone] Relation: Daughter   Goals of care: Advanced Directive information    12/14/2023   12:34 PM  Advanced Directives  Does Patient Have a Medical Advance Directive? Yes  Type of Advance Directive Out of facility DNR (pink MOST or yellow form)  Does patient want to make changes to medical advance directive? No - Patient declined  Would patient like information on creating a medical advance directive? No - Patient declined  Pre-existing out of facility DNR order (yellow form or pink MOST form) Yellow form placed in chart (order not valid for inpatient use)     CODE STATUS:  Do Not Resuscitate (DNR)   Chief Complaint  Patient presents with   Back Pain    Left Side Back Pain.      HPI: Pt is a 89 y.o. female seen today for an acute visit for Left Side Back Pain.  Patient has a history of collagenous colitis, history of chronic diastolic heart failure, COPD, hypertension, diabetes, history of A-fib on systemic anticoagulation, hyperlipidemia.  She does not have a history of recurrent UTIs.  She developed left-sided flank and back pain last night.  Rates it about a 7 out of 10 at night.  After some Tylenol  this morning it is down to a 5.  She denies any dysuria.  No fever or chills.  She does not recall if she has had  a fall or not.  She does not think so.  She appears oriented x 4.  X-rays were performed earlier today.  Results are not back yet.     Past Medical History:  Diagnosis Date   Acute pancreatitis 07/24/2021   Aortic atherosclerosis    Arthritis    Basal cell carcinoma 03/30/2008   left upper arm/excision   Basal cell carcinoma 03/31/2008   left upper arm, right lower leg   Basal cell carcinoma 01/18/2009   left upper arm   Centrilobular emphysema (HCC)    Chronic heart failure with preserved ejection fraction (HCC)    Collagenous colitis    COPD (chronic obstructive pulmonary disease) (HCC)    Dementia (HCC)    Diabetes mellitus without complication (HCC)    Diverticulitis    Elevated LFTs 07/25/2021   Heart murmur    at birth, none since   History of echocardiogram    a. 11/2015: echo showing EF of 60-65% with no WMA. Grade 1 DD and mild MR noted.    Hyperlipidemia    Per [facility]   Hypertension    Intertrochanteric fracture of right femur (HCC) 06/11/2019   Lack of coordination    Per [facility] term current use of anticoagulant    Per [facility]   Noninfective gastroenteritis and colitis, unspecified    Per [facility]   PAF (paroxysmal atrial fibrillation) (HCC)  a. initially diagnosed in 08/2016 --> started on Eliquis    Squamous cell carcinoma of skin 05/16/2020   Left nasal tip anterior to ala, refer to [facility]   Syncope    a. initially occurring in Fall 2016 b. Hospitalized in 10/2015 and 11/2015 for recurrent episodes.    Past Surgical History:  Procedure Laterality Date   ABDOMINAL HYSTERECTOMY     BACK SURGERY  08/08/2008   Lumbar discectomy   CATARACT EXTRACTION W/PHACO Left 12/08/2023   Procedure: PHACOEMULSIFICATION, CATARACT, WITH IOL  27.38 02:03.5;  Surgeon: Jaye Fallow, MD;  Location: [facility];  Service: Ophthalmology;  Laterality: Left;   COLONOSCOPY WITH PROPOFOL      EXCISION/RELEASE BURSA HIP Left 05/03/2019   Procedure: HIP  ABDUCTOR REPAIR;  Surgeon: Kathlynn Sharper, MD;  Location: [facility] OR[facility];  Service: Orthopedics;  Laterality: Left;   INTRAMEDULLARY (IM) NAIL INTERTROCHANTERIC Right 06/13/2019   Procedure: INTRAMEDULLARY (IM) NAIL INTERTROCHANTRIC;  Surgeon: Kathlynn Sharper, MD;  Location: [facility];  Service: Orthopedics;  Laterality: Right;   NECK SURGERY  1980     Allergies  Allergen Reactions   Morphine  Sulfate Other (See Comments)    BP bottoms out   Penicillins Diarrhea    Has patient had a PCN reaction causing immediate rash, facial/tongue/throat swelling, SOB or lightheadedness with hypotension: Yes Has patient had a PCN reaction causing severe rash involving mucus membranes or skin necrosis: Yes Has patient had a PCN reaction that required hospitalization No Has patient had a PCN reaction occurring within the last 10 years: No If all of the above answers are NO, then may proceed with Cephalosporin use.     Outpatient Encounter Medications as of 12/29/2023  Medication Sig   acetaminophen  (TYLENOL ) 325 MG tablet Take 650 mg by mouth in the morning, at noon, and at bedtime. Give 650mg  by mouth every 4 hours as needed   acetaminophen  (TYLENOL ) 500 MG tablet Take 1,000 mg by mouth stat.   apixaban  (ELIQUIS ) 5 MG TABS tablet Take 1 tablet (5 mg total) by mouth 2 (two) times daily.   ARTIFICIAL TEAR SOLUTION OP Apply 1 drop to eye 4 (four) times daily.   bismuth subsalicylate (PEPTO BISMOL) 262 MG/15ML suspension Take 30 mLs by mouth every 4 (four) hours as needed.   Cholecalciferol  (VITAMIN D ) 50 MCG (2000 UT) CAPS Give one capsule by mouth once daily .   Clobetasol  Propionate 0.05 % shampoo Apply 1 Application topically every morning. And wash out. Tues,Fri   empagliflozin  (JARDIANCE ) 10 MG TABS tablet Take 1 tablet (10 mg total) by mouth daily before breakfast.   ezetimibe  (ZETIA ) 10 MG tablet Take 1 tablet (10 mg total) by mouth daily.   fluocinolone  (SYNALAR ) 0.01 % external solution Apply 1 Application  topically at bedtime. Apply to scalp   furosemide  (LASIX ) 20 MG tablet Take 20 mg by mouth daily. Give one tablet by mouth every 24 hours as needed if 3lbs or greater weight gain in 24hrs.   glipiZIDE  (GLUCOTROL ) 10 MG tablet Take 10 mg by mouth 2 (two) times daily before a meal.   linagliptin  (TRADJENTA ) 5 MG TABS tablet Take 5 mg by mouth daily.   lisinopril  (ZESTRIL ) 5 MG tablet Take 5 mg by mouth daily.   metFORMIN  (GLUCOPHAGE ) 1000 MG tablet Take 1,000 mg by mouth 2 (two) times daily.   metoprolol  succinate (TOPROL -XL) 25 MG 24 hr tablet TAKE 1 TABLET BY MOUTH ONCE DAILY   Multiple Vitamin (MULTIVITAMIN WITH MINERALS) TABS tablet Take 1 tablet by mouth  daily.   Throat Lozenges (COUGH DROPS) LOZG Use as directed 1 lozenge in the mouth or throat as needed.   Wheat Dextrin (BENEFIBER PO) Take 1 each by mouth daily.   ZINC OXIDE, TOPICAL, EX Apply 1 Application topically every 8 (eight) hours as needed (urethra prolapse).   Ensure (ENSURE) Take 237 mLs by mouth as needed (For weight loss or decreased intake). (Patient not taking: Reported on 12/29/2023)   fluticasone  (FLONASE ) 50 MCG/ACT nasal spray Place 2 sprays into both nostrils daily. (Patient not taking: Reported on 12/29/2023)   Guaifenesin  200 MG/5ML LIQD Take 5 mLs by mouth every 6 (six) hours as needed. (Patient not taking: Reported on 12/29/2023)   Infant Care Products [facility] EX) Apply to buttocks/periarea topically every shift (Patient not taking: Reported on 12/29/2023)   No facility-administered encounter medications on file as of 12/29/2023.     Review of Systems  Constitutional: Negative.   HENT: Negative.    Eyes: Negative.   Respiratory: Negative.    Cardiovascular: Negative.   Gastrointestinal: Negative.   Endocrine: Negative.   Genitourinary: Negative.   Musculoskeletal:  Positive for back pain.       Left lateral lumbar area with radiation to left lateral ribs Denies any dysuria, burning with urination.   Skin: Negative.   Allergic/Immunologic: Negative.   Neurological: Negative.   Hematological: Negative.   Psychiatric/Behavioral: Negative.    All other systems reviewed and are negative.    Immunization History  Administered Date(s) Administered   Fluad Quad(high Dose 65+) 12/20/2018   INFLUENZA, HIGH DOSE SEASONAL PF 01/18/2014, 01/01/2015, 11/30/2015, 12/01/2016, 01/11/2018, 12/08/2019, 12/26/2022   Influenza Split 01/01/2009, 01/02/2010, 01/13/2011, 12/22/2011   Influenza-Unspecified 01/08/2013, 11/08/2020, 12/25/2021, 12/22/2023   Moderna Covid-19 Fall Seasonal Vaccine 85yrs & older 06/17/2022, 12/05/2022   Moderna Covid-19 Vaccine Bivalent Booster 86yrs & up 11/30/2020, 08/06/2021, 01/17/2022   PFIZER Comirnaty(Gray Top)Covid-19 Tri-Sucrose Vaccine 07/27/2020   PFIZER(Purple Top)SARS-COV-2 Vaccination 04/01/2019, 04/22/2019, 12/05/2019, 07/27/2020   Pfizer Covid-19 Vaccine Bivalent Booster 53yrs & up 11/30/2020   Pneumococcal Conjugate-13 10/19/2013   Pneumococcal Polysaccharide-23 01/02/2010   Td 11/02/2003, 01/13/2011   Tdap 01/13/2011, 02/28/2015   Unspecified SARS-COV-2 Vaccination 06/19/2023   Zoster Recombinant(Shingrix) 08/11/2022, 11/25/2022   Zoster, Live 07/14/2007   Pertinent  Health Maintenance Due  Topic Date Due   HEMOGLOBIN A1C  12/11/2023   OPHTHALMOLOGY EXAM  05/03/2024   FOOT EXAM  10/29/2024   Influenza Vaccine  Completed   DEXA SCAN  Completed   Mammogram  Discontinued      07/24/2021   10:50 PM 07/25/2021   10:02 AM 07/26/2021    1:00 AM 09/18/2022    9:32 AM 10/27/2023   10:25 AM  Fall Risk  Falls in the past year?    0 0  Was there an injury with Fall?     0  Fall Risk Category Calculator     0  (RETIRED) Patient Fall Risk Level High fall risk  High fall risk  High fall risk     Patient at Risk for Falls Due to     Impaired balance/gait;Impaired mobility     Data saved with a previous flowsheet row definition   Functional Status Survey:      Vitals:   12/29/23 1356  BP: 122/62  Pulse: 74  Resp: 18  Temp: 97.8 F (36.6 C)  SpO2: 98%  Weight: 162 lb (73.5 kg)  Height: 5' 3 (1.6 m)   Body mass index is 28.7 kg/m. Physical Exam Vitals and  nursing note reviewed.  Constitutional:      General: She is not in acute distress.    Appearance: She is not toxic-appearing or diaphoretic.     Comments: Elderly female. No distress  HENT:     Head: Normocephalic and atraumatic.     Nose: Nose normal.  Eyes:     General: No scleral icterus. Cardiovascular:     Rate and Rhythm: Normal rate and regular rhythm.  Pulmonary:     Effort: Pulmonary effort is normal. No respiratory distress.     Breath sounds: No wheezing.  Abdominal:     Palpations: Abdomen is soft.  Musculoskeletal:       Back:  Skin:    General: Skin is warm and dry.     Capillary Refill: Capillary refill takes less than 2 seconds.  Neurological:     General: No focal deficit present.     Mental Status: She is alert and oriented to person, place, and time.      Labs reviewed: Recent Labs    04/27/23 0000 06/11/23 0000 10/23/23 1510  NA 141 141  --   K 4.3 4.3  --   CL 109* 106  --   CO2 22 27*  --   BUN 38* 25*  --   CREATININE 1.1 1.0 1.50*  CALCIUM  8.7 9.0  --     Recent Labs    04/27/23 0000 10/19/23 0000 12/11/23 0000  WBC 6.1 6.3 7.4  NEUTROABS 1,830.00 2,596.00  --   HGB 12.4 12.0 13.8  HCT 37 36 43  PLT 237 263 322   Lab Results  Component Value Date   TSH 2.284 04/17/2022   Lab Results  Component Value Date   HGBA1C 8.1 06/11/2023   Lab Results  Component Value Date   CHOL 241 (A) 11/20/2022   HDL 43 11/20/2022   LDLCALC 165 11/20/2022   TRIG 179 (A) 11/20/2022   CHOLHDL 5.2 05/10/2022   Assessment & Plan Acute left-sided thoracic back pain X-rays of [place] taken.  Awaiting results.  Give her 1000 mg of Tylenol .  She is already on 650 mg of Tylenol  3 times a day.  She does not recall having a fall or any sort  of trauma.  She has tenderness to palpation that is reproducible.  Likely this is musculoskeletal.  She denies any dysuria symptoms, fever, chills.  I do not think this is pyelonephritis.        Camellia Door, DO [facility] [phone]

## 2023-12-29 NOTE — Progress Notes (Signed)
 This encounter was created in error - please disregard.

## 2023-12-30 ENCOUNTER — Encounter: Payer: Self-pay | Admitting: Internal Medicine

## 2023-12-30 ENCOUNTER — Non-Acute Institutional Stay (SKILLED_NURSING_FACILITY): Payer: Self-pay | Admitting: Internal Medicine

## 2023-12-30 DIAGNOSIS — M546 Pain in thoracic spine: Secondary | ICD-10-CM

## 2023-12-30 NOTE — Progress Notes (Signed)
 [facility] Acute Care Progress Note    Location:  Other [facility] [place].  Nursing Home Room Number: [employer] [id number] Place of Service:  SNF (31)   ERE:Ryzw, Camellia, DO   Patient Care Team: Laurence Camellia, DO as PCP - General (Internal Medicine) End, Lonni, MD as PCP - Cardiology (Cardiology) Mittie Gaskin, MD as Referring Physician (Ophthalmology) Caro Harlene POUR, NP as Nurse Practitioner (Geriatric Medicine) Pa, [facility])   Extended Emergency Contact Information Primary Emergency Contact: Bridgette Barnie DEL Address: [address]          [place], [place] [address] [place] Home Phone: [phone] Work Phone: [phone] Relation: Daughter   Goals of care: Advanced Directive information    12/14/2023   12:34 PM  Advanced Directives  Does Patient Have a Medical Advance Directive? Yes  Type of Advance Directive Out of facility DNR (pink MOST or yellow form)  Does patient want to make changes to medical advance directive? No - Patient declined  Would patient like information on creating a medical advance directive? No - Patient declined  Pre-existing out of facility DNR order (yellow form or pink MOST form) Yellow form placed in chart (order not valid for inpatient use)     CODE STATUS: Do Not Resuscitate (DNR)   Chief Complaint  Patient presents with   Back Pain    Back Pain.      HPI: Pt is a 89 y.o. female seen today for an acute visit for Back Pain.  This is a follow-up on her left sided back pain that was evaluated 2 days ago.  Her x-ray was negative for any acute fractures.  Her Tylenol  was increased to 1000 mg 3 times daily.  Patient states that her back is much more comfortable now.  I called the daughter Barnie Bridgette at [phone].  Patient is getting seen by urology in November for a left renal cyst.  She is ready status post hysterectomy.  Discussed with the daughter that patient's x-rays were negative.  I think  this is most likely musculoskeletal pain related to either arthritis in her back or a pulled muscle in her lumbar spine.     Past Medical History:  Diagnosis Date   Acute pancreatitis 07/24/2021   Aortic atherosclerosis    Arthritis    Basal cell carcinoma 03/30/2008   left upper arm/excision   Basal cell carcinoma 03/31/2008   left upper arm, right lower leg   Basal cell carcinoma 01/18/2009   left upper arm   Centrilobular emphysema (HCC)    Chronic heart failure with preserved ejection fraction (HCC)    Collagenous colitis    COPD (chronic obstructive pulmonary disease) (HCC)    Dementia (HCC)    Diabetes mellitus without complication (HCC)    Diverticulitis    Elevated LFTs 07/25/2021   Heart murmur    at birth, none since   History of echocardiogram    a. 11/2015: echo showing EF of 60-65% with no WMA. Grade 1 DD and mild MR noted.    Hyperlipidemia    Per [facility]   Hypertension    Intertrochanteric fracture of right femur (HCC) 06/11/2019   Lack of coordination    Per [facility] term current use of anticoagulant    Per [facility]   Noninfective gastroenteritis and colitis, unspecified    Per [facility]   PAF (paroxysmal atrial fibrillation) (HCC)    a. initially diagnosed in 08/2016 --> started  on Eliquis    Squamous cell carcinoma of skin 05/16/2020   Left nasal tip anterior to ala, refer to [facility]   Syncope    a. initially occurring in Fall 2016 b. Hospitalized in 10/2015 and 11/2015 for recurrent episodes.    Past Surgical History:  Procedure Laterality Date   ABDOMINAL HYSTERECTOMY     BACK SURGERY  08/08/2008   Lumbar discectomy   CATARACT EXTRACTION W/PHACO Left 12/08/2023   Procedure: PHACOEMULSIFICATION, CATARACT, WITH IOL  27.38 02:03.5;  Surgeon: Jaye Fallow, MD;  Location: [facility];  Service: Ophthalmology;  Laterality: Left;   COLONOSCOPY WITH PROPOFOL      EXCISION/RELEASE BURSA HIP Left 05/03/2019   Procedure: HIP ABDUCTOR  REPAIR;  Surgeon: Kathlynn Sharper, MD;  Location: [facility];  Service: Orthopedics;  Laterality: Left;   INTRAMEDULLARY (IM) NAIL INTERTROCHANTERIC Right 06/13/2019   Procedure: INTRAMEDULLARY (IM) NAIL INTERTROCHANTRIC;  Surgeon: Kathlynn Sharper, MD;  Location: [facility];  Service: Orthopedics;  Laterality: Right;   NECK SURGERY  1980     Allergies  Allergen Reactions   Morphine  Sulfate Other (See Comments)    BP bottoms out   Penicillins Diarrhea    Has patient had a PCN reaction causing immediate rash, facial/tongue/throat swelling, SOB or lightheadedness with hypotension: Yes Has patient had a PCN reaction causing severe rash involving mucus membranes or skin necrosis: Yes Has patient had a PCN reaction that required hospitalization No Has patient had a PCN reaction occurring within the last 10 years: No If all of the above answers are NO, then may proceed with Cephalosporin use.     Outpatient Encounter Medications as of 12/30/2023  Medication Sig   acetaminophen  (TYLENOL ) 325 MG tablet Take 650 mg by mouth every 4 (four) hours as needed.   acetaminophen  (TYLENOL ) 500 MG tablet Take 1,000 mg by mouth 3 (three) times daily.   apixaban  (ELIQUIS ) 5 MG TABS tablet Take 1 tablet (5 mg total) by mouth 2 (two) times daily.   ARTIFICIAL TEAR SOLUTION OP Apply 1 drop to eye 4 (four) times daily.   bismuth subsalicylate (PEPTO BISMOL) 262 MG/15ML suspension Take 30 mLs by mouth every 4 (four) hours as needed.   Cholecalciferol  (VITAMIN D ) 50 MCG (2000 UT) CAPS Give one capsule by mouth once daily .   Clobetasol  Propionate 0.05 % shampoo Apply 1 Application topically every morning. And wash out. Tues,Fri   empagliflozin  (JARDIANCE ) 10 MG TABS tablet Take 1 tablet (10 mg total) by mouth daily before breakfast.   ezetimibe  (ZETIA ) 10 MG tablet Take 1 tablet (10 mg total) by mouth daily.   fluocinolone  (SYNALAR ) 0.01 % external solution Apply 1 Application topically at bedtime. Apply to scalp    furosemide  (LASIX ) 20 MG tablet Take 20 mg by mouth daily. Give one tablet by mouth every 24 hours as needed if 3lbs or greater weight gain in 24hrs.   glipiZIDE  (GLUCOTROL ) 10 MG tablet Take 10 mg by mouth 2 (two) times daily before a meal.   linagliptin  (TRADJENTA ) 5 MG TABS tablet Take 5 mg by mouth daily.   lisinopril  (ZESTRIL ) 5 MG tablet Take 5 mg by mouth daily.   metFORMIN  (GLUCOPHAGE ) 1000 MG tablet Take 1,000 mg by mouth 2 (two) times daily.   metoprolol  succinate (TOPROL -XL) 25 MG 24 hr tablet TAKE 1 TABLET BY MOUTH ONCE DAILY   Multiple Vitamin (MULTIVITAMIN WITH MINERALS) TABS tablet Take 1 tablet by mouth daily.   Throat Lozenges (COUGH DROPS) LOZG Use as directed 1 lozenge in the  mouth or throat as needed.   Wheat Dextrin (BENEFIBER PO) Take 1 each by mouth daily.   ZINC OXIDE, TOPICAL, EX Apply 1 Application topically every 8 (eight) hours as needed (urethra prolapse).   Ensure (ENSURE) Take 237 mLs by mouth as needed (For weight loss or decreased intake). (Patient not taking: Reported on 12/30/2023)   fluticasone  (FLONASE ) 50 MCG/ACT nasal spray Place 2 sprays into both nostrils daily. (Patient not taking: Reported on 12/30/2023)   Guaifenesin  200 MG/5ML LIQD Take 5 mLs by mouth every 6 (six) hours as needed. (Patient not taking: Reported on 12/30/2023)   Infant Care Products [facility] EX) Apply to buttocks/periarea topically every shift (Patient not taking: Reported on 12/30/2023)   No facility-administered encounter medications on file as of 12/30/2023.     Review of Systems  Musculoskeletal:        Left sided flank/rib pain improved with 1000 mg of Tylenol  3 times daily.  All other systems reviewed and are negative.    Immunization History  Administered Date(s) Administered   Fluad Quad(high Dose 65+) 12/20/2018   INFLUENZA, HIGH DOSE SEASONAL PF 01/18/2014, 01/01/2015, 11/30/2015, 12/01/2016, 01/11/2018, 12/08/2019, 12/26/2022   Influenza Split 01/01/2009,  01/02/2010, 01/13/2011, 12/22/2011   Influenza-Unspecified 01/08/2013, 11/08/2020, 12/25/2021, 12/22/2023   Moderna Covid-19 Fall Seasonal Vaccine 52yrs & older 06/17/2022, 12/05/2022   Moderna Covid-19 Vaccine Bivalent Booster 39yrs & up 11/30/2020, 08/06/2021, 01/17/2022   PFIZER Comirnaty(Gray Top)Covid-19 Tri-Sucrose Vaccine 07/27/2020   PFIZER(Purple Top)SARS-COV-2 Vaccination 04/01/2019, 04/22/2019, 12/05/2019, 07/27/2020   Pfizer Covid-19 Vaccine Bivalent Booster 21yrs & up 11/30/2020   Pneumococcal Conjugate-13 10/19/2013   Pneumococcal Polysaccharide-23 01/02/2010   Td 11/02/2003, 01/13/2011   Tdap 01/13/2011, 02/28/2015   Unspecified SARS-COV-2 Vaccination 06/19/2023   Zoster Recombinant(Shingrix) 08/11/2022, 11/25/2022   Zoster, Live 07/14/2007   Pertinent  Health Maintenance Due  Topic Date Due   HEMOGLOBIN A1C  12/11/2023   OPHTHALMOLOGY EXAM  05/03/2024   FOOT EXAM  10/29/2024   Influenza Vaccine  Completed   DEXA SCAN  Completed   Mammogram  Discontinued      07/24/2021   10:50 PM 07/25/2021   10:02 AM 07/26/2021    1:00 AM 09/18/2022    9:32 AM 10/27/2023   10:25 AM  Fall Risk  Falls in the past year?    0 0  Was there an injury with Fall?     0  Fall Risk Category Calculator     0  (RETIRED) Patient Fall Risk Level High fall risk  High fall risk  High fall risk     Patient at Risk for Falls Due to     Impaired balance/gait;Impaired mobility     Data saved with a previous flowsheet row definition   Functional Status Survey:     Vitals:   12/30/23 1243  BP: (!) 140/80  Pulse: 80  Resp: 20  Temp: 97.7 F (36.5 C)  SpO2: 98%  Weight: 162 lb (73.5 kg)  Height: 5' 3 (1.6 m)   Body mass index is 28.7 kg/m. Physical Exam Vitals and nursing note reviewed.  Constitutional:      General: She is not in acute distress.    Appearance: She is normal weight. She is not toxic-appearing.  Musculoskeletal:     Comments: Mild tenderness in her left 11th and 12th  right rib head on the left thorax.  Skin:    General: Skin is warm and dry.     Capillary Refill: Capillary refill takes less than 2 seconds.  Neurological:     Mental Status: She is alert.      Labs reviewed: Recent Labs    04/27/23 0000 06/11/23 0000 10/23/23 1510  NA 141 141  --   K 4.3 4.3  --   CL 109* 106  --   CO2 22 27*  --   BUN 38* 25*  --   CREATININE 1.1 1.0 1.50*  CALCIUM  8.7 9.0  --     Recent Labs    04/27/23 0000 10/19/23 0000 12/11/23 0000  WBC 6.1 6.3 7.4  NEUTROABS 1,830.00 2,596.00  --   HGB 12.4 12.0 13.8  HCT 37 36 43  PLT 237 263 322   Lab Results  Component Value Date   TSH 2.284 04/17/2022   Lab Results  Component Value Date   HGBA1C 8.1 06/11/2023   Lab Results  Component Value Date   CHOL 241 (A) 11/20/2022   HDL 43 11/20/2022   LDLCALC 165 11/20/2022   TRIG 179 (A) 11/20/2022   CHOLHDL 5.2 05/10/2022      Assessment & Plan Acute left-sided thoracic back pain x-rays of her lumbar spine were negative for any fractures.  Does show some mild arthritic changes.  Continue with 1000 mg of Tylenol  3 times daily.  Patient states that her back pain has improved with increased dose of Tylenol .  Daughter is going to take her to see the urologist  in November.         [facility] [phone]

## 2023-12-30 NOTE — Assessment & Plan Note (Addendum)
 x-rays of her lumbar spine were negative for any fractures.  Does show some mild arthritic changes.  Continue with 1000 mg of Tylenol  3 times daily.  Patient states that her back pain has improved with increased dose of Tylenol .  Daughter is going to take her to see the urologist  in November.

## 2024-01-05 ENCOUNTER — Non-Acute Institutional Stay (SKILLED_NURSING_FACILITY): Admitting: Nurse Practitioner

## 2024-01-05 ENCOUNTER — Encounter: Payer: Self-pay | Admitting: Nurse Practitioner

## 2024-01-05 DIAGNOSIS — M546 Pain in thoracic spine: Secondary | ICD-10-CM

## 2024-01-05 DIAGNOSIS — E1159 Type 2 diabetes mellitus with other circulatory complications: Secondary | ICD-10-CM | POA: Diagnosis not present

## 2024-01-05 DIAGNOSIS — I480 Paroxysmal atrial fibrillation: Secondary | ICD-10-CM

## 2024-01-05 DIAGNOSIS — N368 Other specified disorders of urethra: Secondary | ICD-10-CM

## 2024-01-05 DIAGNOSIS — G8929 Other chronic pain: Secondary | ICD-10-CM

## 2024-01-05 DIAGNOSIS — I10 Essential (primary) hypertension: Secondary | ICD-10-CM | POA: Diagnosis not present

## 2024-01-05 DIAGNOSIS — E785 Hyperlipidemia, unspecified: Secondary | ICD-10-CM

## 2024-01-05 DIAGNOSIS — I5032 Chronic diastolic (congestive) heart failure: Secondary | ICD-10-CM

## 2024-01-05 NOTE — Progress Notes (Unsigned)
 Location:  Other [facility].  Nursing Home Room Number: [facility] [id number] Place of Service:  SNF [phone]) Harlene An, NP  PCP: Laurence Locus, DO  Patient Care Team: Laurence Locus, DO as PCP - General (Internal Medicine) End, Lonni, MD as PCP - Cardiology (Cardiology) Mittie Gaskin, MD as Referring Physician (Ophthalmology) An Harlene POUR, NP as Nurse Practitioner (Geriatric Medicine) Pa, [facility]  Extended Emergency Contact Information Primary Emergency Contact: Bridgette Barnie DEL Address: [address]          [place], [place] [address] [place] Home Phone: [phone] Work Phone: [phone] Relation: Daughter  Goals of care: Advanced Directive information    12/14/2023   12:34 PM  Advanced Directives  Does Patient Have a Medical Advance Directive? Yes  Type of Advance Directive Out of facility DNR (pink MOST or yellow form)  Does patient want to make changes to medical advance directive? No - Patient declined  Would patient like information on creating a medical advance directive? No - Patient declined  Pre-existing out of facility DNR order (yellow form or pink MOST form) Yellow form placed in chart (order not valid for inpatient use)   Chief Complaint  Patient presents with   Medical Management of Chronic Issues    Medical Management of Chronic Issues.    HPI:  The patient is a 89 year old female presenting for routine follow-up and chronic disease management. Her medical history is significant for atrial fibrillation, hypertension, hyperlipidemia, type 2 diabetes mellitus, congestive heart failure, and urethral prolapse.  She is also being evaluated today for persistent back pain.The patient describes her back pain as a dull, nagging ache without associated burning, tingling, or radicular symptoms. The pain has been present for approximately one year. She reports decreased appetite this morning due to the discomfort. She  is currently taking acetaminophen  three times daily and has Voltaren gel ordered as needed. She reports acetaminophen  helps with the pain but does not take it away.   Denies any recent vaginal bleeding- she has been evaluated by gyn and urology.   She denies any skin changes, pruritus, or other associated symptoms. Last bowel movement was yesterday.  She reports adequate oral intake overall and consumes 2-3 glasses of iced tea daily, along with coffee and water. She does not engage in formal exercise but ambulates with a rollator walker.   She is continent of urine and stool and has no difficulty with swallowing. She has a history of headaches but reports no recent changes in vision; she was recently evaluated by an eye care provider.   She denies chest pain, nausea, vomiting, diarrhea, falls, or other pain complaints. Sleep is reported as adequate.  She resides near her daughter and son, takes her medications as prescribed, and has no additional complaints. She expresses satisfaction with her care and interactions with healthcare providers.  Past Medical History:  Diagnosis Date   Acute pancreatitis 07/24/2021   Aortic atherosclerosis    Arthritis    Basal cell carcinoma 03/30/2008   left upper arm/excision   Basal cell carcinoma 03/31/2008   left upper arm, right lower leg   Basal cell carcinoma 01/18/2009   left upper arm   Centrilobular emphysema (HCC)    Chronic heart failure with preserved ejection fraction (HCC)    Collagenous colitis    COPD (chronic obstructive pulmonary disease) (HCC)    Dementia (HCC)    Diabetes mellitus without complication (HCC)    Diverticulitis    Elevated LFTs 07/25/2021  Heart murmur    at birth, none since   History of echocardiogram    a. 11/2015: echo showing EF of 60-65% with no WMA. Grade 1 DD and mild MR noted.    Hyperlipidemia    Per [facility]   Hypertension    Intertrochanteric fracture of right femur (HCC) 06/11/2019   Lack of  coordination    Per [facility] term current use of anticoagulant    Per [facility]   Noninfective gastroenteritis and colitis, unspecified    Per [facility]   PAF (paroxysmal atrial fibrillation) (HCC)    a. initially diagnosed in 08/2016 --> started on Eliquis    Squamous cell carcinoma of skin 05/16/2020   Left nasal tip anterior to ala, refer to [facility]   Syncope    a. initially occurring in Fall 2016 b. Hospitalized in 10/2015 and 11/2015 for recurrent episodes.    Past Surgical History:  Procedure Laterality Date   ABDOMINAL HYSTERECTOMY     BACK SURGERY  08/08/2008   Lumbar discectomy   CATARACT EXTRACTION W/PHACO Left 12/08/2023   Procedure: PHACOEMULSIFICATION, CATARACT, WITH IOL  27.38 02:03.5;  Surgeon: Jaye Fallow, MD;  Location: [facility];  Service: Ophthalmology;  Laterality: Left;   COLONOSCOPY WITH PROPOFOL      EXCISION/RELEASE BURSA HIP Left 05/03/2019   Procedure: HIP ABDUCTOR REPAIR;  Surgeon: Kathlynn Sharper, MD;  Location: [facility];  Service: Orthopedics;  Laterality: Left;   INTRAMEDULLARY (IM) NAIL INTERTROCHANTERIC Right 06/13/2019   Procedure: INTRAMEDULLARY (IM) NAIL INTERTROCHANTRIC;  Surgeon: Kathlynn Sharper, MD;  Location: [facility];  Service: Orthopedics;  Laterality: Right;   NECK SURGERY  1980   Allergies  Allergen Reactions   Morphine  Sulfate Other (See Comments)    BP bottoms out   Penicillins Diarrhea    Has patient had a PCN reaction causing immediate rash, facial/tongue/throat swelling, SOB or lightheadedness with hypotension: Yes Has patient had a PCN reaction causing severe rash involving mucus membranes or skin necrosis: Yes Has patient had a PCN reaction that required hospitalization No Has patient had a PCN reaction occurring within the last 10 years: No If all of the above answers are NO, then may proceed with Cephalosporin use.   Outpatient Encounter Medications as of 01/05/2024  Medication Sig   acetaminophen  (TYLENOL )  325 MG tablet Take 650 mg by mouth every 4 (four) hours as needed.   acetaminophen  (TYLENOL ) 500 MG tablet Take 1,000 mg by mouth 3 (three) times daily.   apixaban  (ELIQUIS ) 5 MG TABS tablet Take 1 tablet (5 mg total) by mouth 2 (two) times daily.   ARTIFICIAL TEAR SOLUTION OP Apply 1 drop to eye 4 (four) times daily.   bismuth subsalicylate (PEPTO BISMOL) 262 MG/15ML suspension Take 30 mLs by mouth every 4 (four) hours as needed.   Cholecalciferol  (VITAMIN D ) 50 MCG (2000 UT) CAPS Give one capsule by mouth once daily .   Clobetasol  Propionate 0.05 % shampoo Apply 1 Application topically every morning. And wash out. Tues,Fri   diclofenac Sodium (VOLTAREN) 1 % GEL Apply 4 g topically every 6 (six) hours as needed.   empagliflozin  (JARDIANCE ) 10 MG TABS tablet Take 1 tablet (10 mg total) by mouth daily before breakfast.   ezetimibe  (ZETIA ) 10 MG tablet Take 1 tablet (10 mg total) by mouth daily.   fluocinolone  (SYNALAR ) 0.01 % external solution Apply 1 Application topically at bedtime. Apply to scalp   furosemide  (LASIX ) 20 MG tablet Take 20 mg by mouth daily.  Give one tablet by mouth every 24 hours as needed if 3lbs or greater weight gain in 24hrs.   glipiZIDE  (GLUCOTROL ) 10 MG tablet Take 10 mg by mouth 2 (two) times daily before a meal.   linagliptin  (TRADJENTA ) 5 MG TABS tablet Take 5 mg by mouth daily.   lisinopril  (ZESTRIL ) 5 MG tablet Take 5 mg by mouth daily.   metFORMIN  (GLUCOPHAGE ) 1000 MG tablet Take 1,000 mg by mouth 2 (two) times daily.   metoprolol  succinate (TOPROL -XL) 25 MG 24 hr tablet TAKE 1 TABLET BY MOUTH ONCE DAILY   Multiple Vitamin (MULTIVITAMIN WITH MINERALS) TABS tablet Take 1 tablet by mouth daily.   Throat Lozenges (COUGH DROPS) LOZG Use as directed 1 lozenge in the mouth or throat as needed.   Wheat Dextrin (BENEFIBER PO) Take 1 each by mouth daily.   ZINC OXIDE, TOPICAL, EX Apply 1 Application topically every 8 (eight) hours as needed (urethra prolapse).   Ensure  (ENSURE) Take 237 mLs by mouth as needed (For weight loss or decreased intake). (Patient not taking: Reported on 01/05/2024)   fluticasone  (FLONASE ) 50 MCG/ACT nasal spray Place 2 sprays into both nostrils daily. (Patient not taking: Reported on 01/05/2024)   Guaifenesin  200 MG/5ML LIQD Take 5 mLs by mouth every 6 (six) hours as needed. (Patient not taking: Reported on 01/05/2024)   Infant Care Products [facility] EX) Apply to buttocks/periarea topically every shift (Patient not taking: Reported on 01/05/2024)   No facility-administered encounter medications on file as of 01/05/2024.   Review of Systems  Constitutional: Negative.   HENT: Negative.    Eyes: Negative.   Respiratory: Negative.    Cardiovascular: Negative.   Gastrointestinal: Negative.   Musculoskeletal:  Positive for back pain.  Skin: Negative.   Neurological:  Positive for weakness.  Psychiatric/Behavioral: Negative.      Immunization History  Administered Date(s) Administered   Fluad Quad(high Dose 65+) 12/20/2018   INFLUENZA, HIGH DOSE SEASONAL PF 01/18/2014, 01/01/2015, 11/30/2015, 12/01/2016, 01/11/2018, 12/08/2019, 12/26/2022   Influenza Split 01/01/2009, 01/02/2010, 01/13/2011, 12/22/2011   Influenza-Unspecified 01/08/2013, 11/08/2020, 12/25/2021, 12/22/2023   Moderna Covid-19 Fall Seasonal Vaccine 50yrs & older 06/17/2022, 12/05/2022   Moderna Covid-19 Vaccine Bivalent Booster 42yrs & up 11/30/2020, 08/06/2021, 01/17/2022   PFIZER Comirnaty(Gray Top)Covid-19 Tri-Sucrose Vaccine 07/27/2020   PFIZER(Purple Top)SARS-COV-2 Vaccination 04/01/2019, 04/22/2019, 12/05/2019, 07/27/2020   Pfizer Covid-19 Vaccine Bivalent Booster 48yrs & up 11/30/2020   Pneumococcal Conjugate-13 10/19/2013   Pneumococcal Polysaccharide-23 01/02/2010   Td 11/02/2003, 01/13/2011   Tdap 01/13/2011, 02/28/2015   Unspecified SARS-COV-2 Vaccination 06/19/2023   Zoster Recombinant(Shingrix) 08/11/2022, 11/25/2022   Zoster, Live 07/14/2007    Pertinent  Health Maintenance Due  Topic Date Due   HEMOGLOBIN A1C  12/11/2023   OPHTHALMOLOGY EXAM  05/03/2024   FOOT EXAM  10/29/2024   Influenza Vaccine  Completed   DEXA SCAN  Completed   Mammogram  Discontinued      07/24/2021   10:50 PM 07/25/2021   10:02 AM 07/26/2021    1:00 AM 09/18/2022    9:32 AM 10/27/2023   10:25 AM  Fall Risk  Falls in the past year?    0 0  Was there an injury with Fall?     0  Fall Risk Category Calculator     0  (RETIRED) Patient Fall Risk Level High fall risk  High fall risk  High fall risk     Patient at Risk for Falls Due to     Impaired balance/gait;Impaired mobility  Data saved with a previous flowsheet row definition   Functional Status Survey:    Vitals:   01/05/24 1028  BP: 136/74  Pulse: 72  Resp: 20  Temp: 97.9 F (36.6 C)  SpO2: 97%  Weight: 162 lb 6.4 oz (73.7 kg)  Height: 5' 3 (1.6 m)   Body mass index is 28.77 kg/m. Physical Exam Vitals reviewed.  Constitutional:      Appearance: Normal appearance.  HENT:     Head: Normocephalic and atraumatic.     Right Ear: External ear normal.     Left Ear: External ear normal.     Nose: Nose normal.     Mouth/Throat:     Mouth: Mucous membranes are moist.     Pharynx: Oropharynx is clear.  Eyes:     Conjunctiva/sclera: Conjunctivae normal.  Cardiovascular:     Rate and Rhythm: Normal rate and regular rhythm.     Pulses: Normal pulses.     Heart sounds: Normal heart sounds.  Pulmonary:     Effort: Pulmonary effort is normal.     Breath sounds: Normal breath sounds.  Abdominal:     General: Bowel sounds are normal.     Palpations: Abdomen is soft.     Tenderness: There is no abdominal tenderness.  Musculoskeletal:        General: Tenderness present.     Cervical back: Tenderness present.     Comments: Tenderness to left upper back  Skin:    General: Skin is warm and dry.  Neurological:     General: No focal deficit present.     Mental Status: She is alert  and oriented to person, place, and time. Mental status is at baseline.  Psychiatric:        Mood and Affect: Mood normal.        Behavior: Behavior normal.    Labs reviewed: Recent Labs    04/27/23 0000 06/11/23 0000 10/23/23 1510  NA 141 141  --   K 4.3 4.3  --   CL 109* 106  --   CO2 22 27*  --   BUN 38* 25*  --   CREATININE 1.1 1.0 1.50*  CALCIUM  8.7 9.0  --    No results for input(s): AST, ALT, ALKPHOS, BILITOT, PROT, ALBUMIN in the last 8760 hours. Recent Labs    04/27/23 0000 10/19/23 0000 12/11/23 0000  WBC 6.1 6.3 7.4  NEUTROABS 1,830.00 2,596.00  --   HGB 12.4 12.0 13.8  HCT 37 36 43  PLT 237 263 322   Lab Results  Component Value Date   TSH 2.284 04/17/2022   Lab Results  Component Value Date   HGBA1C 8.1 06/11/2023   Lab Results  Component Value Date   CHOL 241 (A) 11/20/2022   HDL 43 11/20/2022   LDLCALC 165 11/20/2022   TRIG 179 (A) 11/20/2022   CHOLHDL 5.2 05/10/2022    Significant Diagnostic Results in last 30 days:  No results found.  Assessment/Plan  1. PAF (paroxysmal atrial fibrillation) (HCC) (Primary) - Chronic and stable. - Continue metoprolol  for rate control. - Continue apixaban  for anticoagulation.  2. Type 2 diabetes mellitus with other circulatory complication, without long-term current use of insulin  (HCC) - Chronic and stable. - Patient declines injectable therapies at this time. - Continue current regimen: empagliflozin , glipizide , linagliptin , and metformin . - Reinforced adherence to a low-carbohydrate diet. - Last hemoglobin A1c was 8.1% (April 2025); will recheck.  3. Essential hypertension - Stable. - Comprehensive metabolic  panel last reviewed in April 2025. - Continue lisinopril  and metoprolol . - Routine monitoring to continue.  4. Chronic left-sided thoracic back pain - Persistent symptoms; prior imaging revealed degenerative arthritic changes, no acute findings. - Continue acetaminophen  three  times daily for pain. - Continue topical diclofenac; encourage more frequent application.  5. Urethral prolapse - Under active management by urology. - Urethral mucosal prolapse noted in lower half of urethra per August 2025 evaluation. - Next urology follow-up scheduled for November 2025; possible cystoscopy to evaluate hematuria. - Continue monitoring for hematuria.  6. Chronic heart failure with preserved ejection fraction (HFpEF) (HCC) - Chronic and stable. - Most recent echocardiogram (March 2024) showed EF 55-60%. - Continue furosemide  and metoprolol .  7. Dyslipidemia - Chronic and stable. - Continue routine monitoring of lipid panel. - Continue ezetimibe  as prescribed.   Irfat Avie, AGPCNP Student - [facility] I personally was present during the history, physical exam and medical decision-making activities of this service and have verified that the service and findings are accurately documented in the student's note  Tannar Broker K. Caro BODILY [facility] [phone]

## 2024-01-05 NOTE — Progress Notes (Unsigned)
 Location:  Other [facility].  Nursing Home Room Number: [facility] [id number] Place of Service:  SNF [phone]) Harlene An, NP  PCP: Laurence Locus, DO  Patient Care Team: Laurence Locus, DO as PCP - General (Internal Medicine) End, Lonni, MD as PCP - Cardiology (Cardiology) Mittie Gaskin, MD as Referring Physician (Ophthalmology) An Harlene POUR, NP as Nurse Practitioner (Geriatric Medicine) Pa, [facility])  Extended Emergency Contact Information Primary Emergency Contact: Bridgette Barnie DEL Address: [address]          [place], [place] [address] [place] Home Phone: [phone] Work Phone: [phone] Relation: Daughter  Goals of care: Advanced Directive information    12/14/2023   12:34 PM  Advanced Directives  Does Patient Have a Medical Advance Directive? Yes  Type of Advance Directive Out of facility DNR (pink MOST or yellow form)  Does patient want to make changes to medical advance directive? No - Patient declined  Would patient like information on creating a medical advance directive? No - Patient declined  Pre-existing out of facility DNR order (yellow form or pink MOST form) Yellow form placed in chart (order not valid for inpatient use)     Chief Complaint  Patient presents with   Medical Management of Chronic Issues    Medical Management of Chronic Issues.     HPI:  Pt is a 89 y.o. female seen today for medical management of chronic disease.    Past Medical History:  Diagnosis Date   Acute pancreatitis 07/24/2021   Aortic atherosclerosis    Arthritis    Basal cell carcinoma 03/30/2008   left upper arm/excision   Basal cell carcinoma 03/31/2008   left upper arm, right lower leg   Basal cell carcinoma 01/18/2009   left upper arm   Centrilobular emphysema (HCC)    Chronic heart failure with preserved ejection fraction (HCC)    Collagenous colitis    COPD (chronic obstructive pulmonary disease) (HCC)     Dementia (HCC)    Diabetes mellitus without complication (HCC)    Diverticulitis    Elevated LFTs 07/25/2021   Heart murmur    at birth, none since   History of echocardiogram    a. 11/2015: echo showing EF of 60-65% with no WMA. Grade 1 DD and mild MR noted.    Hyperlipidemia    Per [facility]   Hypertension    Intertrochanteric fracture of right femur (HCC) 06/11/2019   Lack of coordination    Per [facility] term current use of anticoagulant    Per [facility]   Noninfective gastroenteritis and colitis, unspecified    Per [facility]   PAF (paroxysmal atrial fibrillation) (HCC)    a. initially diagnosed in 08/2016 --> started on Eliquis    Squamous cell carcinoma of skin 05/16/2020   Left nasal tip anterior to ala, refer to [facility]   Syncope    a. initially occurring in Fall 2016 b. Hospitalized in 10/2015 and 11/2015 for recurrent episodes.    Past Surgical History:  Procedure Laterality Date   ABDOMINAL HYSTERECTOMY     BACK SURGERY  08/08/2008   Lumbar discectomy   CATARACT EXTRACTION W/PHACO Left 12/08/2023   Procedure: PHACOEMULSIFICATION, CATARACT, WITH IOL  27.38 02:03.5;  Surgeon: Jaye Fallow, MD;  Location: [facility];  Service: Ophthalmology;  Laterality: Left;   COLONOSCOPY WITH PROPOFOL      EXCISION/RELEASE BURSA HIP Left 05/03/2019   Procedure: HIP ABDUCTOR REPAIR;  Surgeon: Kathlynn Sharper, MD;  Location: [facility];  Service: Orthopedics;  Laterality: Left;   INTRAMEDULLARY (IM) NAIL INTERTROCHANTERIC Right 06/13/2019   Procedure: INTRAMEDULLARY (IM) NAIL INTERTROCHANTRIC;  Surgeon: Kathlynn Sharper, MD;  Location: [facility] OR[facility];  Service: Orthopedics;  Laterality: Right;   NECK SURGERY  1980    Allergies  Allergen Reactions   Morphine  Sulfate Other (See Comments)    BP bottoms out   Penicillins Diarrhea    Has patient had a PCN reaction causing immediate rash, facial/tongue/throat swelling, SOB or lightheadedness with hypotension: Yes Has patient had a  PCN reaction causing severe rash involving mucus membranes or skin necrosis: Yes Has patient had a PCN reaction that required hospitalization No Has patient had a PCN reaction occurring within the last 10 years: No If all of the above answers are NO, then Juliany Daughety proceed with Cephalosporin use.    Outpatient Encounter Medications as of 01/05/2024  Medication Sig   acetaminophen  (TYLENOL ) 325 MG tablet Take 650 mg by mouth every 4 (four) hours as needed.   acetaminophen  (TYLENOL ) 500 MG tablet Take 1,000 mg by mouth 3 (three) times daily.   apixaban  (ELIQUIS ) 5 MG TABS tablet Take 1 tablet (5 mg total) by mouth 2 (two) times daily.   ARTIFICIAL TEAR SOLUTION OP Apply 1 drop to eye 4 (four) times daily.   bismuth subsalicylate (PEPTO BISMOL) 262 MG/15ML suspension Take 30 mLs by mouth every 4 (four) hours as needed.   Cholecalciferol  (VITAMIN D ) 50 MCG (2000 UT) CAPS Give one capsule by mouth once daily .   Clobetasol  Propionate 0.05 % shampoo Apply 1 Application topically every morning. And wash out. Tues,Fri   diclofenac Sodium (VOLTAREN) 1 % GEL Apply 4 g topically every 6 (six) hours as needed.   empagliflozin  (JARDIANCE ) 10 MG TABS tablet Take 1 tablet (10 mg total) by mouth daily before breakfast.   ezetimibe  (ZETIA ) 10 MG tablet Take 1 tablet (10 mg total) by mouth daily.   fluocinolone  (SYNALAR ) 0.01 % external solution Apply 1 Application topically at bedtime. Apply to scalp   furosemide  (LASIX ) 20 MG tablet Take 20 mg by mouth daily. Give one tablet by mouth every 24 hours as needed if 3lbs or greater weight gain in 24hrs.   glipiZIDE  (GLUCOTROL ) 10 MG tablet Take 10 mg by mouth 2 (two) times daily before a meal.   linagliptin  (TRADJENTA ) 5 MG TABS tablet Take 5 mg by mouth daily.   lisinopril  (ZESTRIL ) 5 MG tablet Take 5 mg by mouth daily.   metFORMIN  (GLUCOPHAGE ) 1000 MG tablet Take 1,000 mg by mouth 2 (two) times daily.   metoprolol  succinate (TOPROL -XL) 25 MG 24 hr tablet TAKE 1  TABLET BY MOUTH ONCE DAILY   Multiple Vitamin (MULTIVITAMIN WITH MINERALS) TABS tablet Take 1 tablet by mouth daily.   Throat Lozenges (COUGH DROPS) LOZG Use as directed 1 lozenge in the mouth or throat as needed.   Wheat Dextrin (BENEFIBER PO) Take 1 each by mouth daily.   ZINC OXIDE, TOPICAL, EX Apply 1 Application topically every 8 (eight) hours as needed (urethra prolapse).   Ensure (ENSURE) Take 237 mLs by mouth as needed (For weight loss or decreased intake). (Patient not taking: Reported on 01/05/2024)   fluticasone  (FLONASE ) 50 MCG/ACT nasal spray Place 2 sprays into both nostrils daily. (Patient not taking: Reported on 01/05/2024)   Guaifenesin  200 MG/5ML LIQD Take 5 mLs by mouth every 6 (six) hours as needed. (Patient not taking: Reported on 01/05/2024)   Infant Care Products [facility] EX) Apply to  buttocks/periarea topically every shift (Patient not taking: Reported on 01/05/2024)   No facility-administered encounter medications on file as of 01/05/2024.    Review of Systems ***  Immunization History  Administered Date(s) Administered   Fluad Quad(high Dose 65+) 12/20/2018   INFLUENZA, HIGH DOSE SEASONAL PF 01/18/2014, 01/01/2015, 11/30/2015, 12/01/2016, 01/11/2018, 12/08/2019, 12/26/2022   Influenza Split 01/01/2009, 01/02/2010, 01/13/2011, 12/22/2011   Influenza-Unspecified 01/08/2013, 11/08/2020, 12/25/2021, 12/22/2023   Moderna Covid-19 Fall Seasonal Vaccine 12yrs & older 06/17/2022, 12/05/2022   Moderna Covid-19 Vaccine Bivalent Booster 79yrs & up 11/30/2020, 08/06/2021, 01/17/2022   PFIZER Comirnaty(Gray Top)Covid-19 Tri-Sucrose Vaccine 07/27/2020   PFIZER(Purple Top)SARS-COV-2 Vaccination 04/01/2019, 04/22/2019, 12/05/2019, 07/27/2020   Pfizer Covid-19 Vaccine Bivalent Booster 55yrs & up 11/30/2020   Pneumococcal Conjugate-13 10/19/2013   Pneumococcal Polysaccharide-23 01/02/2010   Td 11/02/2003, 01/13/2011   Tdap 01/13/2011, 02/28/2015   Unspecified SARS-COV-2  Vaccination 06/19/2023   Zoster Recombinant(Shingrix) 08/11/2022, 11/25/2022   Zoster, Live 07/14/2007   Pertinent  Health Maintenance Due  Topic Date Due   HEMOGLOBIN A1C  12/11/2023   OPHTHALMOLOGY EXAM  05/03/2024   FOOT EXAM  10/29/2024   Influenza Vaccine  Completed   DEXA SCAN  Completed   Mammogram  Discontinued      07/24/2021   10:50 PM 07/25/2021   10:02 AM 07/26/2021    1:00 AM 09/18/2022    9:32 AM 10/27/2023   10:25 AM  Fall Risk  Falls in the past year?    0 0  Was there an injury with Fall?     0  Fall Risk Category Calculator     0  (RETIRED) Patient Fall Risk Level High fall risk  High fall risk  High fall risk     Patient at Risk for Falls Due to     Impaired balance/gait;Impaired mobility     Data saved with a previous flowsheet row definition   Functional Status Survey:    Vitals:   01/05/24 1028  BP: 136/74  Pulse: 72  Resp: 20  Temp: 97.9 F (36.6 C)  SpO2: 97%  Weight: 162 lb 6.4 oz (73.7 kg)  Height: 5' 3 (1.6 m)   Body mass index is 28.77 kg/m. Physical Exam***  Labs reviewed: Recent Labs    04/27/23 0000 06/11/23 0000 10/23/23 1510  NA 141 141  --   K 4.3 4.3  --   CL 109* 106  --   CO2 22 27*  --   BUN 38* 25*  --   CREATININE 1.1 1.0 1.50*  CALCIUM  8.7 9.0  --    No results for input(s): AST, ALT, ALKPHOS, BILITOT, PROT, ALBUMIN in the last 8760 hours. Recent Labs    04/27/23 0000 10/19/23 0000 12/11/23 0000  WBC 6.1 6.3 7.4  NEUTROABS 1,830.00 2,596.00  --   HGB 12.4 12.0 13.8  HCT 37 36 43  PLT 237 263 322   Lab Results  Component Value Date   TSH 2.284 04/17/2022   Lab Results  Component Value Date   HGBA1C 8.1 06/11/2023   Lab Results  Component Value Date   CHOL 241 (A) 11/20/2022   HDL 43 11/20/2022   LDLCALC 165 11/20/2022   TRIG 179 (A) 11/20/2022   CHOLHDL 5.2 05/10/2022    Significant Diagnostic Results in last 30 days:  No results found.  Assessment/Plan No problem-specific  Assessment & Plan notes found for this encounter.     Jessica K. Caro BODILY [facility] [phone]

## 2024-01-07 ENCOUNTER — Encounter: Payer: Self-pay | Admitting: Nurse Practitioner

## 2024-01-07 ENCOUNTER — Non-Acute Institutional Stay (SKILLED_NURSING_FACILITY): Payer: Self-pay | Admitting: Nurse Practitioner

## 2024-01-07 DIAGNOSIS — M546 Pain in thoracic spine: Secondary | ICD-10-CM

## 2024-01-07 DIAGNOSIS — G8929 Other chronic pain: Secondary | ICD-10-CM | POA: Diagnosis not present

## 2024-01-07 NOTE — Progress Notes (Signed)
 Location:  Other [facility].  Nursing Home Room Number: [facility] [id number] Place of Service:  SNF [phone]) Harlene An, NP  PCP: Laurence Locus, DO  Patient Care Team: Laurence Locus, DO as PCP - General (Internal Medicine) End, Lonni, MD as PCP - Cardiology (Cardiology) Mittie Gaskin, MD as Referring Physician (Ophthalmology) An Harlene POUR, NP as Nurse Practitioner (Geriatric Medicine) Pa, [facility])  Extended Emergency Contact Information Primary Emergency Contact: Bridgette Barnie DEL Address: [address]          [place], [place] [address] [place] Home Phone: [phone] Work Phone: [phone] Relation: Daughter  Goals of care: Advanced Directive information    12/14/2023   12:34 PM  Advanced Directives  Does Patient Have a Medical Advance Directive? Yes  Type of Advance Directive Out of facility DNR (pink MOST or yellow form)  Does patient want to make changes to medical advance directive? No - Patient declined  Would patient like information on creating a medical advance directive? No - Patient declined  Pre-existing out of facility DNR order (yellow form or pink MOST form) Yellow form placed in chart (order not valid for inpatient use)     Chief Complaint  Patient presents with   Back Pain    Back Pain.     HPI:  Pt is a 89 y.o. female seen today for an acute visit for back pain at the request of staff.  She was seen 2 days ago due to back pain which tylenol  helped.  She reports today she is having no pain at all and was better yesterday as well.  She reports she is happy with continuing tylenol  to help with pain management. Back pain has been on and off for the last year. No new symptoms noted   Past Medical History:  Diagnosis Date   Acute pancreatitis 07/24/2021   Aortic atherosclerosis    Arthritis    Basal cell carcinoma 03/30/2008   left upper arm/excision   Basal cell carcinoma 03/31/2008   left upper  arm, right lower leg   Basal cell carcinoma 01/18/2009   left upper arm   Centrilobular emphysema (HCC)    Chronic heart failure with preserved ejection fraction (HCC)    Collagenous colitis    COPD (chronic obstructive pulmonary disease) (HCC)    Dementia (HCC)    Diabetes mellitus without complication (HCC)    Diverticulitis    Elevated LFTs 07/25/2021   Heart murmur    at birth, none since   History of echocardiogram    a. 11/2015: echo showing EF of 60-65% with no WMA. Grade 1 DD and mild MR noted.    Hyperlipidemia    Per [facility]   Hypertension    Intertrochanteric fracture of right femur (HCC) 06/11/2019   Lack of coordination    Per [facility] term current use of anticoagulant    Per [facility]   Noninfective gastroenteritis and colitis, unspecified    Per [facility]   PAF (paroxysmal atrial fibrillation) (HCC)    a. initially diagnosed in 08/2016 --> started on Eliquis    Squamous cell carcinoma of skin 05/16/2020   Left nasal tip anterior to ala, refer to [facility]   Syncope    a. initially occurring in Fall 2016 b. Hospitalized in 10/2015 and 11/2015 for recurrent episodes.    Past Surgical History:  Procedure Laterality Date   ABDOMINAL HYSTERECTOMY     BACK SURGERY  08/08/2008   Lumbar  discectomy   CATARACT EXTRACTION W/PHACO Left 12/08/2023   Procedure: PHACOEMULSIFICATION, CATARACT, WITH IOL  27.38 02:03.5;  Surgeon: Jaye Fallow, MD;  Location: [facility];  Service: Ophthalmology;  Laterality: Left;   COLONOSCOPY WITH PROPOFOL      EXCISION/RELEASE BURSA HIP Left 05/03/2019   Procedure: HIP ABDUCTOR REPAIR;  Surgeon: Kathlynn Sharper, MD;  Location: [facility] OR[facility];  Service: Orthopedics;  Laterality: Left;   INTRAMEDULLARY (IM) NAIL INTERTROCHANTERIC Right 06/13/2019   Procedure: INTRAMEDULLARY (IM) NAIL INTERTROCHANTRIC;  Surgeon: Kathlynn Sharper, MD;  Location: [facility] ORS;  Service: Orthopedics;  Laterality: Right;   NECK SURGERY  1980    Allergies   Allergen Reactions   Morphine  Sulfate Other (See Comments)    BP bottoms out   Penicillins Diarrhea    Has patient had a PCN reaction causing immediate rash, facial/tongue/throat swelling, SOB or lightheadedness with hypotension: Yes Has patient had a PCN reaction causing severe rash involving mucus membranes or skin necrosis: Yes Has patient had a PCN reaction that required hospitalization No Has patient had a PCN reaction occurring within the last 10 years: No If all of the above answers are NO, then may proceed with Cephalosporin use.    Outpatient Encounter Medications as of 01/07/2024  Medication Sig   acetaminophen  (TYLENOL ) 325 MG tablet Take 650 mg by mouth every 4 (four) hours as needed.   acetaminophen  (TYLENOL ) 500 MG tablet Take 1,000 mg by mouth 3 (three) times daily.   apixaban  (ELIQUIS ) 5 MG TABS tablet Take 1 tablet (5 mg total) by mouth 2 (two) times daily.   ARTIFICIAL TEAR SOLUTION OP Apply 1 drop to eye 4 (four) times daily.   bismuth subsalicylate (PEPTO BISMOL) 262 MG/15ML suspension Take 30 mLs by mouth every 4 (four) hours as needed.   Cholecalciferol  (VITAMIN D ) 50 MCG (2000 UT) CAPS Give one capsule by mouth once daily .   Clobetasol  Propionate 0.05 % shampoo Apply 1 Application topically every morning. And wash out. Tues,Fri   diclofenac Sodium (VOLTAREN) 1 % GEL Apply 4 g topically every 6 (six) hours as needed.   empagliflozin  (JARDIANCE ) 10 MG TABS tablet Take 1 tablet (10 mg total) by mouth daily before breakfast.   ezetimibe  (ZETIA ) 10 MG tablet Take 1 tablet (10 mg total) by mouth daily.   fluocinolone  (SYNALAR ) 0.01 % external solution Apply 1 Application topically at bedtime. Apply to scalp   furosemide  (LASIX ) 20 MG tablet Take 20 mg by mouth daily. Give one tablet by mouth every 24 hours as needed if 3lbs or greater weight gain in 24hrs.   glipiZIDE  (GLUCOTROL ) 10 MG tablet Take 10 mg by mouth 2 (two) times daily before a meal.   lidocaine  4 % Place  1 patch onto the skin daily.   linagliptin  (TRADJENTA ) 5 MG TABS tablet Take 5 mg by mouth daily.   lisinopril  (ZESTRIL ) 5 MG tablet Take 5 mg by mouth daily.   metFORMIN  (GLUCOPHAGE ) 1000 MG tablet Take 1,000 mg by mouth 2 (two) times daily.   metoprolol  succinate (TOPROL -XL) 25 MG 24 hr tablet TAKE 1 TABLET BY MOUTH ONCE DAILY   Multiple Vitamin (MULTIVITAMIN WITH MINERALS) TABS tablet Take 1 tablet by mouth daily.   Throat Lozenges (COUGH DROPS) LOZG Use as directed 1 lozenge in the mouth or throat as needed.   Wheat Dextrin (BENEFIBER PO) Take 1 each by mouth daily.   ZINC OXIDE, TOPICAL, EX Apply 1 Application topically every 8 (eight) hours as needed (urethra prolapse).   Ensure (ENSURE)  Take 237 mLs by mouth as needed (For weight loss or decreased intake). (Patient not taking: Reported on 01/07/2024)   fluticasone  (FLONASE ) 50 MCG/ACT nasal spray Place 2 sprays into both nostrils daily. (Patient not taking: Reported on 01/07/2024)   Guaifenesin  200 MG/5ML LIQD Take 5 mLs by mouth every 6 (six) hours as needed. (Patient not taking: Reported on 01/07/2024)   Infant Care Products [facility] EX) Apply to buttocks/periarea topically every shift (Patient not taking: Reported on 01/07/2024)   No facility-administered encounter medications on file as of 01/07/2024.    Review of Systems  Constitutional:  Negative for activity change and appetite change.  Musculoskeletal:  Positive for arthralgias and back pain.  Skin:  Negative for color change and wound.    Immunization History  Administered Date(s) Administered   Fluad Quad(high Dose 65+) 12/20/2018   INFLUENZA, HIGH DOSE SEASONAL PF 01/18/2014, 01/01/2015, 11/30/2015, 12/01/2016, 01/11/2018, 12/08/2019, 12/26/2022   Influenza Split 01/01/2009, 01/02/2010, 01/13/2011, 12/22/2011   Influenza-Unspecified 01/08/2013, 11/08/2020, 12/25/2021, 12/22/2023   Moderna Covid-19 Fall Seasonal Vaccine 24yrs & older 06/17/2022, 12/05/2022   Moderna  Covid-19 Vaccine Bivalent Booster 17yrs & up 11/30/2020, 08/06/2021, 01/17/2022   PFIZER Comirnaty(Gray Top)Covid-19 Tri-Sucrose Vaccine 07/27/2020   PFIZER(Purple Top)SARS-COV-2 Vaccination 04/01/2019, 04/22/2019, 12/05/2019, 07/27/2020   Pfizer Covid-19 Vaccine Bivalent Booster 52yrs & up 11/30/2020   Pneumococcal Conjugate-13 10/19/2013   Pneumococcal Polysaccharide-23 01/02/2010   Td 11/02/2003, 01/13/2011   Tdap 01/13/2011, 02/28/2015   Unspecified SARS-COV-2 Vaccination 06/19/2023   Zoster Recombinant(Shingrix) 08/11/2022, 11/25/2022   Zoster, Live 07/14/2007   Pertinent  Health Maintenance Due  Topic Date Due   HEMOGLOBIN A1C  12/11/2023   OPHTHALMOLOGY EXAM  05/03/2024   FOOT EXAM  10/29/2024   Influenza Vaccine  Completed   DEXA SCAN  Completed   Mammogram  Discontinued      07/24/2021   10:50 PM 07/25/2021   10:02 AM 07/26/2021    1:00 AM 09/18/2022    9:32 AM 10/27/2023   10:25 AM  Fall Risk  Falls in the past year?    0 0  Was there an injury with Fall?     0  Fall Risk Category Calculator     0  (RETIRED) Patient Fall Risk Level High fall risk  High fall risk  High fall risk     Patient at Risk for Falls Due to     Impaired balance/gait;Impaired mobility     Data saved with a previous flowsheet row definition   Functional Status Survey:    Vitals:   01/07/24 1029  BP: 123/65  Pulse: 76  Resp: 20  Temp: (!) 96 F (35.6 C)  SpO2: 98%  Weight: 162 lb 6.4 oz (73.7 kg)  Height: 5' 3 (1.6 m)   Body mass index is 28.77 kg/m. Physical Exam Constitutional:      Appearance: Normal appearance.  Pulmonary:     Effort: Pulmonary effort is normal.  Neurological:     Mental Status: She is alert. Mental status is at baseline.  Psychiatric:        Mood and Affect: Mood normal.     Labs reviewed: Recent Labs    04/27/23 0000 06/11/23 0000 10/23/23 1510  NA 141 141  --   K 4.3 4.3  --   CL 109* 106  --   CO2 22 27*  --   BUN 38* 25*  --   CREATININE  1.1 1.0 1.50*  CALCIUM  8.7 9.0  --    No results for  input(s): AST, ALT, ALKPHOS, BILITOT, PROT, ALBUMIN in the last 8760 hours. Recent Labs    04/27/23 0000 10/19/23 0000 12/11/23 0000  WBC 6.1 6.3 7.4  NEUTROABS 1,830.00 2,596.00  --   HGB 12.4 12.0 13.8  HCT 37 36 43  PLT 237 263 322   Lab Results  Component Value Date   TSH 2.284 04/17/2022   Lab Results  Component Value Date   HGBA1C 8.1 06/11/2023   Lab Results  Component Value Date   CHOL 241 (A) 11/20/2022   HDL 43 11/20/2022   LDLCALC 165 11/20/2022   TRIG 179 (A) 11/20/2022   CHOLHDL 5.2 05/10/2022    Significant Diagnostic Results in last 30 days:  No results found.  Assessment/Plan 1. Chronic left-sided thoracic back pain (Primary) Pain is chronic and has actually improved in the last 2 days Continue tylenol  scheduled TID Add lidocaine  patch 4% q 12 hours PRN if pain worsens.    Kharson [name] K. Caro BODILY [facility] [phone]

## 2024-01-11 LAB — COMPREHENSIVE METABOLIC PANEL WITH GFR
Albumin: 3.9 (ref 3.5–5.0)
Calcium: 9.2 (ref 8.7–10.7)
Globulin: 2.5
eGFR: 43

## 2024-01-11 LAB — BASIC METABOLIC PANEL WITH GFR
BUN: 26 — AB (ref 4–21)
CO2: 27 — AB (ref 13–22)
Chloride: 104 (ref 99–108)
Creatinine: 1.2 — AB (ref 0.5–1.1)
Glucose: 250
Potassium: 4.3 meq/L (ref 3.5–5.1)
Sodium: 138 (ref 137–147)

## 2024-01-11 LAB — HEMOGLOBIN A1C: Hemoglobin A1C: 7.1

## 2024-01-11 LAB — HEPATIC FUNCTION PANEL
ALT: 27 U/L (ref 7–35)
AST: 17 (ref 13–35)
Alkaline Phosphatase: 48 (ref 25–125)
Bilirubin, Total: 0.4

## 2024-01-11 LAB — CBC AND DIFFERENTIAL
HCT: 40 (ref 36–46)
Hemoglobin: 13 (ref 12.0–16.0)
Neutrophils Absolute: 3223
Platelets: 266 K/uL (ref 150–400)
WBC: 6.7

## 2024-01-11 LAB — CBC: RBC: 4.28 (ref 3.87–5.11)

## 2024-01-18 ENCOUNTER — Ambulatory Visit: Admitting: Urology

## 2024-01-18 VITALS — BP 150/78 | HR 74

## 2024-01-18 DIAGNOSIS — R319 Hematuria, unspecified: Secondary | ICD-10-CM | POA: Diagnosis not present

## 2024-01-18 LAB — URINALYSIS, COMPLETE
Bilirubin, UA: NEGATIVE
Glucose, UA: NEGATIVE
Ketones, UA: NEGATIVE
Nitrite, UA: NEGATIVE
Protein,UA: NEGATIVE
Specific Gravity, UA: 1.02 (ref 1.005–1.030)
Urobilinogen, Ur: 0.2 mg/dL (ref 0.2–1.0)
pH, UA: 5.5 (ref 5.0–7.5)

## 2024-01-18 LAB — MICROSCOPIC EXAMINATION

## 2024-01-18 MED ORDER — CIPROFLOXACIN HCL 250 MG PO TABS: 250.0000 mg | ORAL_TABLET | Freq: Two times a day (BID) | ORAL | 0 refills | Status: AC

## 2024-01-18 MED ORDER — LIDOCAINE HCL URETHRAL/MUCOSAL 2 % EX GEL
1.0000 | Freq: Once | CUTANEOUS | Status: AC
  Administered 2024-01-18: 1 via URETHRAL

## 2024-01-18 NOTE — Progress Notes (Signed)
 01/18/2024 10:20 AM   [name] [birth year] [id number]  Referring provider: Abdul Fine, MD No address on file  No chief complaint on file.   HPI: Dr. Twylla: Vaginal bleeding secondary to urethral mass on Eliquis .  Urinary incontinence   Today I reviewed the photo taken by Dr. Twylla and the finding is in keeping with hemorrhagic urethra mucosal prolapse Patient is not a strong historian.  She says she sometimes wears pads during the day but wears them at night.  She then says she does not have urge incontinence or bedwetting.  She voids every 2 or 3 hours.  She is not sure she sees blood in the urine or blood with wiping or on a pad.  She says she has not seen blood for a number of days.  She has a history of atrial fibrillation on Eliquis    Patient had a CT scan of the abdomen pelvis with and without contrast October 23, 2023.  She had a cyst on the left kidney and a 2 mm nonobstructing stone in the right kidney.  She had a little bit of thickening of her bladder felt secondary to trabeculation.   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  There was extending 2 and half centimeters or less.  There was a little bit of a hemorrhagic area closer to the meatus itself.  Nontender.  Nonfriable.  She had a grade 3 cystocele and has limited mobility    Picture drawn. Conservative therapy recommended. In my opinion she is not a good surgical candidate. Having said that I think is best to reexamine her still in a few months and perform a safety cystoscopy. This will be arranged. It will also help clear her for any issues with possible hematuria   Today Patient may have had bleeding 1 month ago but otherwise has not been having spotting or bleeding.  Not complaining of any urinary tract infection.  Does not complain of prolapse symptoms.  On repeat pelvic examination she has significant urethral mucosal prolapse approximately  2.5 cm or less but the area looks nice and smooth and healthy and not friable or bleeding.  Patient underwent flexible cystoscopy.  Bladder mucosa and trigone were normal.  Urethra normal.  Urine was very cloudy with a lot of sediment and white flecks and clinically was infected.  Urine sent for culture after aspirating  PMH: Past Medical History:  Diagnosis Date   Acute pancreatitis 07/24/2021   Aortic atherosclerosis    Arthritis    Basal cell carcinoma 03/30/2008   left upper arm/excision   Basal cell carcinoma 03/31/2008   left upper arm, right lower leg   Basal cell carcinoma 01/18/2009   left upper arm   Centrilobular emphysema (HCC)    Chronic heart failure with preserved ejection fraction (HCC)    Collagenous colitis    COPD (chronic obstructive pulmonary disease) (HCC)    Dementia (HCC)    Diabetes mellitus without complication (HCC)    Diverticulitis    Elevated LFTs 07/25/2021   Heart murmur    at birth, none since   History of echocardiogram    a. 11/2015: echo showing EF of 60-65% with no WMA. Grade 1 DD and mild MR noted.    Hyperlipidemia    Per [facility]   Hypertension    Intertrochanteric fracture of right femur (HCC) 06/11/2019   Lack of coordination    Per [facility]  term current use of anticoagulant    Per [facility]   Noninfective gastroenteritis and colitis, unspecified    Per [facility]   PAF (paroxysmal atrial fibrillation) (HCC)    a. initially diagnosed in 08/2016 --> started on Eliquis    Squamous cell carcinoma of skin 05/16/2020   Left nasal tip anterior to ala, refer to [facility]   Syncope    a. initially occurring in Fall 2016 b. Hospitalized in 10/2015 and 11/2015 for recurrent episodes.     Surgical History: Past Surgical History:  Procedure Laterality Date   ABDOMINAL HYSTERECTOMY     BACK SURGERY  08/08/2008   Lumbar discectomy   CATARACT EXTRACTION W/PHACO Left 12/08/2023   Procedure: PHACOEMULSIFICATION, CATARACT, WITH IOL   27.38 02:03.5;  Surgeon: Jaye Fallow, MD;  Location: [facility];  Service: Ophthalmology;  Laterality: Left;   COLONOSCOPY WITH PROPOFOL      EXCISION/RELEASE BURSA HIP Left 05/03/2019   Procedure: HIP ABDUCTOR REPAIR;  Surgeon: Kathlynn Sharper, MD;  Location: [facility] OR[facility];  Service: Orthopedics;  Laterality: Left;   INTRAMEDULLARY (IM) NAIL INTERTROCHANTERIC Right 06/13/2019   Procedure: INTRAMEDULLARY (IM) NAIL INTERTROCHANTRIC;  Surgeon: Kathlynn Sharper, MD;  Location: [facility];  Service: Orthopedics;  Laterality: Right;   NECK SURGERY  1980    Home Medications:  Allergies as of 01/18/2024       Reactions   Morphine  Sulfate Other (See Comments)   BP bottoms out   Penicillins Diarrhea   Has patient had a PCN reaction causing immediate rash, facial/tongue/throat swelling, SOB or lightheadedness with hypotension: Yes Has patient had a PCN reaction causing severe rash involving mucus membranes or skin necrosis: Yes Has patient had a PCN reaction that required hospitalization No Has patient had a PCN reaction occurring within the last 10 years: No If all of the above answers are NO, then may proceed with Cephalosporin use.        Medication List        Accurate as of January 18, 2024 10:20 AM. If you have any questions, ask your nurse or doctor.          acetaminophen  325 MG tablet Commonly known as: TYLENOL  Take 650 mg by mouth every 4 (four) hours as needed.   acetaminophen  500 MG tablet Commonly known as: TYLENOL  Take 1,000 mg by mouth 3 (three) times daily.   apixaban  5 MG Tabs tablet Commonly known as: ELIQUIS  Take 1 tablet (5 mg total) by mouth 2 (two) times daily.   ARTIFICIAL TEAR SOLUTION OP Apply 1 drop to eye 4 (four) times daily.   BENEFIBER PO Take 1 each by mouth daily.   bismuth subsalicylate 262 MG/15ML suspension Commonly known as: PEPTO BISMOL Take 30 mLs by mouth every 4 (four) hours as needed.   Clobetasol  Propionate 0.05 %  shampoo Apply 1 Application topically every morning. And wash out. Tues,Fri   Cough Drops Lozg Use as directed 1 lozenge in the mouth or throat as needed.   DERMACLOUD EX Apply to buttocks/periarea topically every shift   empagliflozin  10 MG Tabs tablet Commonly known as: Jardiance  Take 1 tablet (10 mg total) by mouth daily before breakfast.   Ensure Take 237 mLs by mouth as needed (For weight loss or decreased intake).   ezetimibe  10 MG tablet Commonly known as: Zetia  Take 1 tablet (10 mg total) by mouth daily.   fluocinolone  0.01 % external solution Commonly known as: SYNALAR  Apply 1 Application topically at bedtime. Apply to scalp   fluticasone  50  MCG/ACT nasal spray Commonly known as: FLONASE  Place 2 sprays into both nostrils daily.   furosemide  20 MG tablet Commonly known as: LASIX  Take 20 mg by mouth daily. Give one tablet by mouth every 24 hours as needed if 3lbs or greater weight gain in 24hrs.   glipiZIDE  10 MG tablet Commonly known as: GLUCOTROL  Take 10 mg by mouth 2 (two) times daily before a meal.   Guaifenesin  200 MG/5ML Liqd Take 5 mLs by mouth every 6 (six) hours as needed.   lidocaine  4 % Place 1 patch onto the skin daily.   linagliptin  5 MG Tabs tablet Commonly known as: TRADJENTA  Take 5 mg by mouth daily.   lisinopril  5 MG tablet Commonly known as: ZESTRIL  Take 5 mg by mouth daily.   metFORMIN  1000 MG tablet Commonly known as: GLUCOPHAGE  Take 1,000 mg by mouth 2 (two) times daily.   metoprolol  succinate 25 MG 24 hr tablet Commonly known as: TOPROL -XL TAKE 1 TABLET BY MOUTH ONCE DAILY   multivitamin with minerals Tabs tablet Take 1 tablet by mouth daily.   Vitamin D  50 MCG (2000 UT) Caps Give one capsule by mouth once daily .   Voltaren 1 % Gel Generic drug: diclofenac Sodium Apply 4 g topically every 6 (six) hours as needed.   ZINC OXIDE (TOPICAL) EX Apply 1 Application topically every 8 (eight) hours as needed (urethra  prolapse).        Allergies:  Allergies  Allergen Reactions   Morphine  Sulfate Other (See Comments)    BP bottoms out   Penicillins Diarrhea    Has patient had a PCN reaction causing immediate rash, facial/tongue/throat swelling, SOB or lightheadedness with hypotension: Yes Has patient had a PCN reaction causing severe rash involving mucus membranes or skin necrosis: Yes Has patient had a PCN reaction that required hospitalization No Has patient had a PCN reaction occurring within the last 10 years: No If all of the above answers are NO, then may proceed with Cephalosporin use.    Family History: Family History  Problem Relation Age of Onset   Hypertension Mother    Diabetes Mother    Lung cancer Father     Social History:  reports that she quit smoking about 45 years ago. Her smoking use included cigarettes. She has never used smokeless tobacco. She reports that she does not currently use alcohol. She reports that she does not use drugs.  ROS:                                        Physical Exam: There were no vitals taken for this visit.  Constitutional:  Alert and oriented, No acute distress. HEENT: [place] AT, moist mucus membranes.  Trachea midline, no masses.  Laboratory Data: Lab Results  Component Value Date   WBC 7.4 12/11/2023   HGB 13.8 12/11/2023   HCT 43 12/11/2023   MCV 88.3 05/14/2022   PLT 322 12/11/2023    Lab Results  Component Value Date   CREATININE 1.50 (H) 10/23/2023    No results found for: PSA  No results found for: TESTOSTERONE  Lab Results  Component Value Date   HGBA1C 8.1 06/11/2023    Urinalysis    Component Value Date/Time   COLORURINE YELLOW (A) 05/09/2022 1240   APPEARANCEUR HAZY (A) 05/09/2022 1240   APPEARANCEUR Clear 11/21/2015 0919   LABSPEC 1.022 05/09/2022 1240   PHURINE  5.0 05/09/2022 1240   GLUCOSEU >=500 (A) 05/09/2022 1240   HGBUR NEGATIVE 05/09/2022 1240   BILIRUBINUR NEGATIVE  05/09/2022 1240   BILIRUBINUR neg 12/05/2019 1206   BILIRUBINUR Negative 11/21/2015 0919   KETONESUR 20 (A) 05/09/2022 1240   PROTEINUR 30 (A) 05/09/2022 1240   UROBILINOGEN 0.2 12/05/2019 1206   NITRITE NEGATIVE 05/09/2022 1240   LEUKOCYTESUR NEGATIVE 05/09/2022 1240    Pertinent Imaging: Urine reviewed and sent for culture  Assessment & Plan: Picture drawn.  Recommend watchful waiting based upon age comorbidities and lack of symptoms.  I do not think she needs surgery at this stage.  I called in ciprofloxacin  250 mg twice a day for 7 days.  See as needed.  Reassurance given.  Call if culture differs.  Patient has otherwise been cleared for hematuria.  I explained to family and wrote a note to the nurses to apply gentle pressure if she has bleeding from the prolapse in the future.  Patient is a retired engineer, civil (consulting)  1. Hematuria, unspecified type (Primary)  - Urinalysis, Complete   No follow-ups on file.  Glendia DELENA Elizabeth, MD  [facility] [address] [place], [place] [address] [phone]

## 2024-02-02 ENCOUNTER — Encounter: Payer: Self-pay | Admitting: Nurse Practitioner

## 2024-02-02 ENCOUNTER — Non-Acute Institutional Stay (SKILLED_NURSING_FACILITY): Admitting: Nurse Practitioner

## 2024-02-02 DIAGNOSIS — N368 Other specified disorders of urethra: Secondary | ICD-10-CM | POA: Diagnosis not present

## 2024-02-02 DIAGNOSIS — K52831 Collagenous colitis: Secondary | ICD-10-CM

## 2024-02-02 DIAGNOSIS — I10 Essential (primary) hypertension: Secondary | ICD-10-CM

## 2024-02-02 DIAGNOSIS — M546 Pain in thoracic spine: Secondary | ICD-10-CM | POA: Diagnosis not present

## 2024-02-02 DIAGNOSIS — E1159 Type 2 diabetes mellitus with other circulatory complications: Secondary | ICD-10-CM

## 2024-02-02 DIAGNOSIS — I5032 Chronic diastolic (congestive) heart failure: Secondary | ICD-10-CM

## 2024-02-02 DIAGNOSIS — G8929 Other chronic pain: Secondary | ICD-10-CM

## 2024-02-02 DIAGNOSIS — I480 Paroxysmal atrial fibrillation: Secondary | ICD-10-CM

## 2024-02-02 NOTE — Assessment & Plan Note (Signed)
 A1c has improved to 7.1, currently on jardiance , glipizide , tradjenta , metformin   Continue dietary modification with med management .

## 2024-02-02 NOTE — Assessment & Plan Note (Signed)
 Blood pressure well controlled on lisinopril  and metoprolol   Continue current medications and dietary modifications follow metabolic panel

## 2024-02-02 NOTE — Assessment & Plan Note (Signed)
 Stable at this time, continue fiber supplement

## 2024-02-02 NOTE — Assessment & Plan Note (Signed)
 Stable followed by urologist, to hold pressure to area if bleeding reoccurs.

## 2024-02-02 NOTE — Assessment & Plan Note (Signed)
 Rate controlled on metoprolol , continues on eliquis 

## 2024-02-02 NOTE — Assessment & Plan Note (Signed)
 Chronic and stable Continue tylenol  scheduled Has lidocaine  patch PRN

## 2024-02-02 NOTE — Assessment & Plan Note (Signed)
 Euvolemic at this time, continue lasix and metoprolol

## 2024-02-02 NOTE — Progress Notes (Signed)
 Location:  Other [facility].  Nursing Home Room Number: [facility] [id number] Place of Service:  SNF [phone]) Harlene An, NP  PCP: Laurence Locus, DO  Patient Care Team: Laurence Locus, DO as PCP - General (Internal Medicine) End, Lonni, MD as PCP - Cardiology (Cardiology) Mittie Gaskin, MD as Referring Physician (Ophthalmology) An Harlene POUR, NP as Nurse Practitioner (Geriatric Medicine) Pa, [facility])  Extended Emergency Contact Information Primary Emergency Contact: Bridgette Barnie DEL Address: [address]          [place], [place] [address] [place] Home Phone: [phone] Work Phone: [phone] Relation: Daughter  Goals of care: Advanced Directive information    12/14/2023   12:34 PM  Advanced Directives  Does Patient Have a Medical Advance Directive? Yes  Type of Advance Directive Out of facility DNR (pink MOST or yellow form)  Does patient want to make changes to medical advance directive? No - Patient declined  Would patient like information on creating a medical advance directive? No - Patient declined  Pre-existing out of facility DNR order (yellow form or pink MOST form) Yellow form placed in chart (order not valid for inpatient use)     Chief Complaint  Patient presents with   Medical Management of Chronic Issues    Medical Management of Chronic Issues.     HPI:  Pt is a 89 y.o. female seen today for medical management of chronic disease.   Pt has been doing well in the last month Saw urologist- prolapse urethra and reports if bleeding occurs to apply pressure but does not need any additional testing or evaluation.  Work up was negative. She continues to complain of flank pain on and off- has lidocaine  patch, gel and tylenol  for pain  Fasting blood sugars in the 150-200s typically She refuses any injectable medication.  A1c is well controlled at 7.1  No skin issues Eats well reports food in facility is very  good.   No increase in LE edema, no shortness of breath  Past Medical History:  Diagnosis Date   Acute pancreatitis 07/24/2021   Aortic atherosclerosis    Arthritis    Basal cell carcinoma 03/30/2008   left upper arm/excision   Basal cell carcinoma 03/31/2008   left upper arm, right lower leg   Basal cell carcinoma 01/18/2009   left upper arm   Centrilobular emphysema (HCC)    Chronic heart failure with preserved ejection fraction (HCC)    Collagenous colitis    COPD (chronic obstructive pulmonary disease) (HCC)    Dementia (HCC)    Diabetes mellitus without complication (HCC)    Diverticulitis    Elevated LFTs 07/25/2021   Heart murmur    at birth, none since   History of echocardiogram    a. 11/2015: echo showing EF of 60-65% with no WMA. Grade 1 DD and mild MR noted.    Hyperlipidemia    Per [facility]   Hypertension    Intertrochanteric fracture of right femur (HCC) 06/11/2019   Lack of coordination    Per [facility] term current use of anticoagulant    Per [facility]   Noninfective gastroenteritis and colitis, unspecified    Per [facility]   PAF (paroxysmal atrial fibrillation) (HCC)    a. initially diagnosed in 08/2016 --> started on Eliquis    Squamous cell carcinoma of skin 05/16/2020   Left nasal tip anterior to ala, refer to [facility]   Syncope    a. initially  occurring in Fall 2016 b. Hospitalized in 10/2015 and 11/2015 for recurrent episodes.    Past Surgical History:  Procedure Laterality Date   ABDOMINAL HYSTERECTOMY     BACK SURGERY  08/08/2008   Lumbar discectomy   CATARACT EXTRACTION W/PHACO Left 12/08/2023   Procedure: PHACOEMULSIFICATION, CATARACT, WITH IOL  27.38 02:03.5;  Surgeon: Jaye Fallow, MD;  Location: [facility];  Service: Ophthalmology;  Laterality: Left;   COLONOSCOPY WITH PROPOFOL      EXCISION/RELEASE BURSA HIP Left 05/03/2019   Procedure: HIP ABDUCTOR REPAIR;  Surgeon: Kathlynn Sharper, MD;  Location: [facility];  Service:  Orthopedics;  Laterality: Left;   INTRAMEDULLARY (IM) NAIL INTERTROCHANTERIC Right 06/13/2019   Procedure: INTRAMEDULLARY (IM) NAIL INTERTROCHANTRIC;  Surgeon: Kathlynn Sharper, MD;  Location: [facility];  Service: Orthopedics;  Laterality: Right;   NECK SURGERY  1980    Allergies  Allergen Reactions   Morphine  Sulfate Other (See Comments)    BP bottoms out   Penicillins Diarrhea    Has patient had a PCN reaction causing immediate rash, facial/tongue/throat swelling, SOB or lightheadedness with hypotension: Yes Has patient had a PCN reaction causing severe rash involving mucus membranes or skin necrosis: Yes Has patient had a PCN reaction that required hospitalization No Has patient had a PCN reaction occurring within the last 10 years: No If all of the above answers are NO, then may proceed with Cephalosporin use.    Outpatient Encounter Medications as of 02/02/2024  Medication Sig   acetaminophen  (TYLENOL ) 325 MG tablet Take 650 mg by mouth every 4 (four) hours as needed.   acetaminophen  (TYLENOL ) 500 MG tablet Take 1,000 mg by mouth 3 (three) times daily.   apixaban  (ELIQUIS ) 5 MG TABS tablet Take 1 tablet (5 mg total) by mouth 2 (two) times daily.   ARTIFICIAL TEAR SOLUTION OP Apply 1 drop to eye 4 (four) times daily.   bismuth subsalicylate (PEPTO BISMOL) 262 MG/15ML suspension Take 30 mLs by mouth every 4 (four) hours as needed.   Cholecalciferol  (VITAMIN D ) 50 MCG (2000 UT) CAPS Give one capsule by mouth once daily .   Clobetasol  Propionate 0.05 % shampoo Apply 1 Application topically every morning. And wash out. Tues,Fri   diclofenac Sodium (VOLTAREN) 1 % GEL Apply 4 g topically every 6 (six) hours as needed.   empagliflozin  (JARDIANCE ) 10 MG TABS tablet Take 1 tablet (10 mg total) by mouth daily before breakfast.   ezetimibe  (ZETIA ) 10 MG tablet Take 1 tablet (10 mg total) by mouth daily.   fluocinolone  (SYNALAR ) 0.01 % external solution Apply 1 Application topically at bedtime.  Apply to scalp   furosemide  (LASIX ) 20 MG tablet Take 20 mg by mouth daily. Give one tablet by mouth every 24 hours as needed if 3lbs or greater weight gain in 24hrs.   glipiZIDE  (GLUCOTROL ) 10 MG tablet Take 10 mg by mouth 2 (two) times daily before a meal.   lidocaine  4 % Place 1 patch onto the skin daily.   linagliptin  (TRADJENTA ) 5 MG TABS tablet Take 5 mg by mouth daily.   lisinopril  (ZESTRIL ) 5 MG tablet Take 5 mg by mouth daily.   metFORMIN  (GLUCOPHAGE ) 1000 MG tablet Take 1,000 mg by mouth 2 (two) times daily.   metoprolol  succinate (TOPROL -XL) 25 MG 24 hr tablet TAKE 1 TABLET BY MOUTH ONCE DAILY   Multiple Vitamin (MULTIVITAMIN WITH MINERALS) TABS tablet Take 1 tablet by mouth daily.   Throat Lozenges (COUGH DROPS) LOZG Use as directed 1 lozenge in the mouth  or throat as needed.   Wheat Dextrin (BENEFIBER PO) Take 1 each by mouth daily.   ZINC OXIDE, TOPICAL, EX Apply 1 Application topically every 8 (eight) hours as needed (urethra prolapse).   No facility-administered encounter medications on file as of 02/02/2024.    Review of Systems  Constitutional:  Negative for activity change, appetite change, fatigue and unexpected weight change.  HENT:  Negative for congestion and hearing loss.   Eyes: Negative.   Respiratory:  Negative for cough and shortness of breath.   Cardiovascular:  Negative for chest pain, palpitations and leg swelling.  Gastrointestinal:  Negative for abdominal pain, constipation and diarrhea.  Genitourinary:  Negative for difficulty urinating and dysuria.  Musculoskeletal:  Positive for back pain. Negative for arthralgias and myalgias.  Skin:  Negative for color change and wound.  Neurological:  Negative for dizziness and weakness.  Psychiatric/Behavioral:  Negative for agitation, behavioral problems and confusion.      Immunization History  Administered Date(s) Administered   Fluad Quad(high Dose 65+) 12/20/2018   INFLUENZA, HIGH DOSE SEASONAL PF  01/18/2014, 01/01/2015, 11/30/2015, 12/01/2016, 01/11/2018, 12/08/2019, 12/26/2022   Influenza Split 01/01/2009, 01/02/2010, 01/13/2011, 12/22/2011   Influenza-Unspecified 01/08/2013, 11/08/2020, 12/25/2021, 12/22/2023   Moderna Covid-19 Fall Seasonal Vaccine 53yrs & older 06/17/2022, 12/05/2022   Moderna Covid-19 Vaccine Bivalent Booster 17yrs & up 11/30/2020, 08/06/2021, 01/17/2022   PFIZER Comirnaty(Gray Top)Covid-19 Tri-Sucrose Vaccine 07/27/2020   PFIZER(Purple Top)SARS-COV-2 Vaccination 04/01/2019, 04/22/2019, 12/05/2019, 07/27/2020   Pfizer Covid-19 Vaccine Bivalent Booster 30yrs & up 11/30/2020   Pneumococcal Conjugate-13 10/19/2013   Pneumococcal Polysaccharide-23 01/02/2010   Td 11/02/2003, 01/13/2011   Tdap 01/13/2011, 02/28/2015   Unspecified SARS-COV-2 Vaccination 06/19/2023   Zoster Recombinant(Shingrix) 08/11/2022, 11/25/2022   Zoster, Live 07/14/2007   Pertinent  Health Maintenance Due  Topic Date Due   OPHTHALMOLOGY EXAM  05/03/2024   HEMOGLOBIN A1C  07/10/2024   FOOT EXAM  10/29/2024   Influenza Vaccine  Completed   Bone Density Scan  Completed   Mammogram  Discontinued      07/24/2021   10:50 PM 07/25/2021   10:02 AM 07/26/2021    1:00 AM 09/18/2022    9:32 AM 10/27/2023   10:25 AM  Fall Risk  Falls in the past year?    0 0  Was there an injury with Fall?     0  Fall Risk Category Calculator     0  (RETIRED) Patient Fall Risk Level High fall risk  High fall risk  High fall risk     Patient at Risk for Falls Due to     Impaired balance/gait;Impaired mobility     Data saved with a previous flowsheet row definition   Functional Status Survey:    Vitals:   02/02/24 0945  BP: 138/74  Pulse: 81  Resp: 20  Temp: (!) 97.3 F (36.3 C)  SpO2: 97%  Weight: 166 lb (75.3 kg)  Height: 5' 3 (1.6 m)   Body mass index is 29.41 kg/m. Physical Exam Constitutional:      General: She is not in acute distress.    Appearance: She is well-developed. She is not  diaphoretic.  HENT:     Head: Normocephalic and atraumatic.     Mouth/Throat:     Pharynx: No oropharyngeal exudate.  Eyes:     Conjunctiva/sclera: Conjunctivae normal.     Pupils: Pupils are equal, round, and reactive to light.  Cardiovascular:     Rate and Rhythm: Normal rate and regular rhythm.  Heart sounds: Normal heart sounds.  Pulmonary:     Effort: Pulmonary effort is normal.     Breath sounds: Normal breath sounds.  Abdominal:     General: Bowel sounds are normal.     Palpations: Abdomen is soft.  Musculoskeletal:     Cervical back: Normal range of motion and neck supple.     Right lower leg: No edema.     Left lower leg: No edema.  Skin:    General: Skin is warm and dry.  Neurological:     Mental Status: She is alert and oriented to person, place, and time.  Psychiatric:        Mood and Affect: Mood normal.     Labs reviewed: Recent Labs    04/27/23 0000 06/11/23 0000 10/23/23 1510 01/11/24 0000  NA 141 141  --  138  K 4.3 4.3  --  4.3  CL 109* 106  --  104  CO2 22 27*  --  27*  BUN 38* 25*  --  26*  CREATININE 1.1 1.0 1.50* 1.2*  CALCIUM  8.7 9.0  --  9.2   Recent Labs    01/11/24 0000  AST 17  ALT 27  ALKPHOS 48  ALBUMIN 3.9   Recent Labs    04/27/23 0000 10/19/23 0000 12/11/23 0000 01/11/24 0000  WBC 6.1 6.3 7.4 6.7  NEUTROABS 1,830.00 2,596.00  --  3,223.00  HGB 12.4 12.0 13.8 13.0  HCT 37 36 43 40  PLT 237 263 322 266   Lab Results  Component Value Date   TSH 2.284 04/17/2022   Lab Results  Component Value Date   HGBA1C 7.1 01/11/2024   Lab Results  Component Value Date   CHOL 241 (A) 11/20/2022   HDL 43 11/20/2022   LDLCALC 165 11/20/2022   TRIG 179 (A) 11/20/2022   CHOLHDL 5.2 05/10/2022    Significant Diagnostic Results in last 30 days:  No results found.  Assessment/Plan Left-sided thoracic back pain Chronic and stable Continue tylenol  scheduled Has lidocaine  patch PRN  Essential hypertension Blood  pressure well controlled on lisinopril  and metoprolol   Continue current medications and dietary modifications follow metabolic panel   Diabetes mellitus with circulatory complication, without long-term current use of insulin  (HCC) A1c has improved to 7.1, currently on jardiance , glipizide , tradjenta , metformin   Continue dietary modification with med management .   Chronic heart failure with preserved ejection fraction (HFpEF) (HCC) Euvolemic at this time, continue lasix  and metoprolol    CC (collagenous colitis) Stable at this time, continue fiber supplement   Paroxysmal atrial fibrillation (HCC) Rate controlled on metoprolol , continues on eliquis    Urethral prolapse Stable followed by urologist, to hold pressure to area if bleeding reoccurs.    Adelaida Reindel K. Caro BODILY [facility] [phone]

## 2024-03-01 ENCOUNTER — Non-Acute Institutional Stay (SKILLED_NURSING_FACILITY): Payer: Self-pay | Admitting: Internal Medicine

## 2024-03-01 ENCOUNTER — Encounter: Payer: Self-pay | Admitting: Internal Medicine

## 2024-03-01 DIAGNOSIS — E785 Hyperlipidemia, unspecified: Secondary | ICD-10-CM | POA: Diagnosis not present

## 2024-03-01 DIAGNOSIS — K52831 Collagenous colitis: Secondary | ICD-10-CM

## 2024-03-01 DIAGNOSIS — M1990 Unspecified osteoarthritis, unspecified site: Secondary | ICD-10-CM | POA: Diagnosis not present

## 2024-03-01 DIAGNOSIS — I10 Essential (primary) hypertension: Secondary | ICD-10-CM

## 2024-03-01 DIAGNOSIS — E1159 Type 2 diabetes mellitus with other circulatory complications: Secondary | ICD-10-CM

## 2024-03-01 DIAGNOSIS — Z7901 Long term (current) use of anticoagulants: Secondary | ICD-10-CM | POA: Diagnosis not present

## 2024-03-01 DIAGNOSIS — J438 Other emphysema: Secondary | ICD-10-CM | POA: Diagnosis not present

## 2024-03-01 DIAGNOSIS — I480 Paroxysmal atrial fibrillation: Secondary | ICD-10-CM | POA: Diagnosis not present

## 2024-03-01 DIAGNOSIS — Z7984 Long term (current) use of oral hypoglycemic drugs: Secondary | ICD-10-CM | POA: Diagnosis not present

## 2024-03-01 DIAGNOSIS — I5032 Chronic diastolic (congestive) heart failure: Secondary | ICD-10-CM

## 2024-03-01 DIAGNOSIS — E1169 Type 2 diabetes mellitus with other specified complication: Secondary | ICD-10-CM

## 2024-03-01 NOTE — Assessment & Plan Note (Signed)
 Continue with Lasix  20 mg every morning and as needed 20 mg for weight gain greater than 5 pounds in 1 week, Jardiance  10 mg daily, lisinopril  5 mg daily, Toprol -XL 25 mg daily

## 2024-03-01 NOTE — Assessment & Plan Note (Addendum)
 Continue with Zetia 10 mg daily

## 2024-03-01 NOTE — Progress Notes (Signed)
 [facility] Routine Visit Progress Note    Location:  Other [place].  Nursing Home Room Number: [facility] [id number] Place of Service:  SNF (31)   Laurence Locus, DO   Patient Care Team: Laurence Locus, DO as PCP - General (Internal Medicine) End, Lonni, MD as PCP - Cardiology (Cardiology) Mittie Gaskin, MD as Referring Physician (Ophthalmology) Caro Harlene POUR, NP as Nurse Practitioner (Geriatric Medicine) [facility], [facility])   Extended Emergency Contact Information Primary Emergency Contact: Bridgette Barnie DEL Address: [address]          [place], [place] [address] [place] Home Phone: [phone] Work Phone: [phone] Relation: Daughter   Goals of care: Advanced Directive information    03/01/2024    9:39 AM  Advanced Directives  Does Patient Have a Medical Advance Directive? Yes  Type of Advance Directive Out of facility DNR (pink MOST or yellow form)  Does patient want to make changes to medical advance directive? No - Patient declined    CODE STATUS: Do Not Resuscitate (DNR)   Chief Complaint  Patient presents with   Medical Management of Chronic Issues    Medical Management of Chronic Issues.      HPI: Pt is a 89 y.o. female seen today for medical management of chronic disease.   89 year old female with a history of paroxysmal atrial fibrillation on chronic anticoagulation, chronic arthritis, history of collagenous colitis, type 2 diabetes, history of COPD, chronic diastolic heart failure, hyperlipidemia associated with type 2 diabetes seen for routine medical care.  We went over some of her pictures and her 50th anniversary picture book.  She has fine memories of when her husband was alive and the times that she spent with her children and her grandchildren at the beach.  She wonders if she is on scheduled diuretics for her occasional lower extremity edema.  She has no other health concerns.   Past Medical History:   Diagnosis Date   Acute pancreatitis 07/24/2021   Aortic atherosclerosis    Arthritis    Basal cell carcinoma 03/30/2008   left upper arm/excision   Basal cell carcinoma 03/31/2008   left upper arm, right lower leg   Basal cell carcinoma 01/18/2009   left upper arm   Centrilobular emphysema (HCC)    Chronic heart failure with preserved ejection fraction (HCC)    Collagenous colitis    COPD (chronic obstructive pulmonary disease) (HCC)    Dementia (HCC)    Diabetes mellitus without complication (HCC)    Diverticulitis    Elevated LFTs 07/25/2021   Heart murmur    at birth, none since   History of echocardiogram    a. 11/2015: echo showing EF of 60-65% with no WMA. Grade 1 DD and mild MR noted.    Hyperlipidemia    Per [facility]   Hypertension    Intertrochanteric fracture of right femur (HCC) 06/11/2019   Lack of coordination    Per [facility] term current use of anticoagulant    Per [facility]   Noninfective gastroenteritis and colitis, unspecified    Per [facility]   PAF (paroxysmal atrial fibrillation) (HCC)    a. initially diagnosed in 08/2016 --> started on Eliquis    Squamous cell carcinoma of skin 05/16/2020   Left nasal tip anterior to ala, refer to [facility]   Syncope    a. initially occurring in Fall 2016 b. Hospitalized in 10/2015 and 11/2015 for recurrent episodes.  Past Surgical History:  Procedure Laterality Date   ABDOMINAL HYSTERECTOMY     BACK SURGERY  08/08/2008   Lumbar discectomy   CATARACT EXTRACTION W/PHACO Left 12/08/2023   Procedure: PHACOEMULSIFICATION, CATARACT, WITH IOL  27.38 02:03.5;  Surgeon: Jaye Fallow, MD;  Location: [facility];  Service: Ophthalmology;  Laterality: Left;   COLONOSCOPY WITH PROPOFOL      EXCISION/RELEASE BURSA HIP Left 05/03/2019   Procedure: HIP ABDUCTOR REPAIR;  Surgeon: Kathlynn Sharper, MD;  Location: [facility];  Service: Orthopedics;  Laterality: Left;   INTRAMEDULLARY (IM) NAIL INTERTROCHANTERIC Right  06/13/2019   Procedure: INTRAMEDULLARY (IM) NAIL INTERTROCHANTRIC;  Surgeon: Kathlynn Sharper, MD;  Location: [facility];  Service: Orthopedics;  Laterality: Right;   NECK SURGERY  1980     Allergies[1]   Outpatient Encounter Medications as of 03/01/2024  Medication Sig   acetaminophen  (TYLENOL ) 500 MG tablet Take 1,000 mg by mouth 3 (three) times daily.   apixaban  (ELIQUIS ) 5 MG TABS tablet Take 1 tablet (5 mg total) by mouth 2 (two) times daily.   ARTIFICIAL TEAR SOLUTION OP Apply 1 drop to eye 4 (four) times daily.   bismuth subsalicylate (PEPTO BISMOL) 262 MG/15ML suspension Take 30 mLs by mouth every 4 (four) hours as needed.   Cholecalciferol  (VITAMIN D ) 50 MCG (2000 UT) CAPS Give one capsule by mouth once daily .   Clobetasol  Propionate 0.05 % shampoo Apply 1 Application topically every morning. And wash out. Tues,Fri   diclofenac Sodium (VOLTAREN) 1 % GEL Apply 4 g topically every 6 (six) hours as needed.   empagliflozin  (JARDIANCE ) 10 MG TABS tablet Take 1 tablet (10 mg total) by mouth daily before breakfast.   ezetimibe  (ZETIA ) 10 MG tablet Take 1 tablet (10 mg total) by mouth daily.   furosemide  (LASIX ) 20 MG tablet Take 20 mg by mouth daily. Give one tablet by mouth every 24 hours as needed if 3lbs or greater weight gain in 24hrs.   glipiZIDE  (GLUCOTROL ) 10 MG tablet Take 10 mg by mouth 2 (two) times daily before a meal.   lidocaine  4 % Place 1 patch onto the skin daily.   linagliptin  (TRADJENTA ) 5 MG TABS tablet Take 5 mg by mouth daily.   lisinopril  (ZESTRIL ) 5 MG tablet Take 5 mg by mouth daily.   metFORMIN  (GLUCOPHAGE ) 500 MG tablet Take 500 mg by mouth 2 (two) times daily.   metoprolol  succinate (TOPROL -XL) 25 MG 24 hr tablet TAKE 1 TABLET BY MOUTH ONCE DAILY   Multiple Vitamin (MULTIVITAMIN WITH MINERALS) TABS tablet Take 1 tablet by mouth daily.   Throat Lozenges (COUGH DROPS) LOZG Use as directed 1 lozenge in the mouth or throat as needed.   Wheat Dextrin (BENEFIBER PO) Take 1  each by mouth daily.   ZINC OXIDE, TOPICAL, EX Apply 1 Application topically every 8 (eight) hours as needed (urethra prolapse).   acetaminophen  (TYLENOL ) 325 MG tablet Take 650 mg by mouth every 4 (four) hours as needed. (Patient not taking: Reported on 03/01/2024)   fluocinolone  (SYNALAR ) 0.01 % external solution Apply 1 Application topically at bedtime. Apply to scalp (Patient not taking: Reported on 03/01/2024)   No facility-administered encounter medications on file as of 03/01/2024.     Review of Systems  Constitutional: Negative.   HENT: Negative.    Eyes: Negative.   Respiratory: Negative.    Cardiovascular:  Positive for leg swelling.  Gastrointestinal: Negative.   Endocrine: Negative.   Genitourinary: Negative.   Musculoskeletal:  Arthritis in various places in her hands, knees, feet  Skin:        Easy bruising.  Allergic/Immunologic: Negative.   Neurological: Negative.   Hematological:  Bruises/bleeds easily.  Psychiatric/Behavioral: Negative.    All other systems reviewed and are negative.     Immunization History  Administered Date(s) Administered   Fluad Quad(high Dose 65+) 12/20/2018   INFLUENZA, HIGH DOSE SEASONAL PF 01/18/2014, 01/01/2015, 11/30/2015, 12/01/2016, 01/11/2018, 12/08/2019, 12/26/2022   Influenza Split 01/01/2009, 01/02/2010, 01/13/2011, 12/22/2011   Influenza-Unspecified 01/08/2013, 11/08/2020, 12/25/2021, 12/22/2023   Moderna Covid-19 Fall Seasonal Vaccine 63yrs & older 06/17/2022, 12/05/2022   Moderna Covid-19 Vaccine Bivalent Booster 101yrs & up 11/30/2020, 08/06/2021, 01/17/2022   PFIZER Comirnaty(Gray Top)Covid-19 Tri-Sucrose Vaccine 07/27/2020   PFIZER(Purple Top)SARS-COV-2 Vaccination 04/01/2019, 04/22/2019, 12/05/2019, 07/27/2020   Pfizer Covid-19 Vaccine Bivalent Booster 91yrs & up 11/30/2020   Pneumococcal Conjugate-13 10/19/2013   Pneumococcal Polysaccharide-23 01/02/2010   Td 11/02/2003, 01/13/2011   Tdap 01/13/2011, 02/28/2015    Unspecified SARS-COV-2 Vaccination 06/19/2023   Zoster Recombinant(Shingrix) 08/11/2022, 11/25/2022   Zoster, Live 07/14/2007   Pertinent  Health Maintenance Due  Topic Date Due   OPHTHALMOLOGY EXAM  05/03/2024   HEMOGLOBIN A1C  07/10/2024   FOOT EXAM  10/29/2024   Influenza Vaccine  Completed   Bone Density Scan  Completed   Mammogram  Discontinued      07/24/2021   10:50 PM 07/25/2021   10:02 AM 07/26/2021    1:00 AM 09/18/2022    9:32 AM 10/27/2023   10:25 AM  Fall Risk  Falls in the past year?    0 0  Was there an injury with Fall?     0   Fall Risk Category Calculator     0  (RETIRED) Patient Fall Risk Level High fall risk  High fall risk  High fall risk     Patient at Risk for Falls Due to     Impaired balance/gait;Impaired mobility     Data saved with a previous flowsheet row definition   Functional Status Survey:     Vitals:   03/01/24 0930  BP: 130/78  Pulse: 94  Resp: 20  Temp: (!) 97 F (36.1 C)  SpO2: 96%  Weight: 165 lb 11.2 oz (75.2 kg)  Height: 5' 3 (1.6 m)   Body mass index is 29.35 kg/m. Physical Exam Vitals and nursing note reviewed.  Constitutional:      General: She is not in acute distress.    Appearance: She is normal weight. She is not toxic-appearing.  HENT:     Head: Normocephalic and atraumatic.     Nose: Nose normal.  Cardiovascular:     Rate and Rhythm: Normal rate and regular rhythm.  Pulmonary:     Effort: Pulmonary effort is normal.     Breath sounds: Normal breath sounds.  Abdominal:     General: Bowel sounds are normal. There is no distension.     Palpations: Abdomen is soft.     Tenderness: There is no abdominal tenderness.  Musculoskeletal:     Comments: Trace ankle edema.  She has had various well-healed skin abrasions on her lower extremities.  Skin:    General: Skin is warm and dry.     Capillary Refill: Capillary refill takes less than 2 seconds.  Neurological:     Mental Status: She is alert and oriented to  person, place, and time.    Labs reviewed: Recent Labs    04/27/23 0000 06/11/23 0000 10/23/23 1510 01/11/24  0000  NA 141 141  --  138  K 4.3 4.3  --  4.3  CL 109* 106  --  104  CO2 22 27*  --  27*  BUN 38* 25*  --  26*  CREATININE 1.1 1.0 1.50* 1.2*  CALCIUM  8.7 9.0  --  9.2   Recent Labs    01/11/24 0000  AST 17  ALT 27  ALKPHOS 48  ALBUMIN 3.9   Recent Labs    04/27/23 0000 10/19/23 0000 12/11/23 0000 01/11/24 0000  WBC 6.1 6.3 7.4 6.7  NEUTROABS 1,830.00 2,596.00  --  3,223.00  HGB 12.4 12.0 13.8 13.0  HCT 37 36 43 40  PLT 237 263 322 266   Lab Results  Component Value Date   TSH 2.284 04/17/2022   Lab Results  Component Value Date   HGBA1C 7.1 01/11/2024   Lab Results  Component Value Date   CHOL 241 (A) 11/20/2022   HDL 43 11/20/2022   LDLCALC 165 11/20/2022   TRIG 179 (A) 11/20/2022   CHOLHDL 5.2 05/10/2022     Assessment/Plan Chronic heart failure with preserved ejection fraction (HFpEF) (HCC) Continue with Lasix  20 mg every morning and as needed 20 mg for weight gain greater than 5 pounds in 1 week, Jardiance  10 mg daily, lisinopril  5 mg daily, Toprol -XL 25 mg daily  Paroxysmal atrial fibrillation (HCC) Continue with Eliquis  5 mg twice daily for CVA prophylaxis, Toprol -XL 25 mg daily  Type 2 diabetes mellitus with other circulatory complications (HCC) Continue with Jardiance  10 mg daily, metformin  500 mg twice daily, Tradjenta  5 mg daily.  Last hemoglobin A1c of 7.1%.  Adequate glycemic control for 89 year old female.  HgBA1c and mean plasma glucose: Lab Results  Component Value Date/Time   HGBA1C 7.1 01/11/2024 12:00 AM   MPG 191.51 04/17/2022 08:20 PM     Current use of long term anticoagulation Continue with Eliquis  5 mg twice daily for CVA prophylaxis for A-fib.  Essential hypertension Continue with Lasix  20 mg daily, Jardiance  10 mg daily, lisinopril  5 mg daily, Toprol -XL 25 mg daily  COPD (chronic obstructive pulmonary  disease) (HCC) Stable.  Not on any routine inhalers.  Arthritis Stable.  On 1000 mg of Tylenol  3 times a day.  CC (collagenous colitis) Stable.  Continue with Benefiber once a day.  Hyperlipidemia associated with type 2 diabetes mellitus (HCC) Continue with Zetia  10 mg daily   Camellia Door, DO [facility] [phone]      [1]  Allergies Allergen Reactions   Morphine  Sulfate Other (See Comments)    BP bottoms out   Penicillins Diarrhea    Has patient had a PCN reaction causing immediate rash, facial/tongue/throat swelling, SOB or lightheadedness with hypotension: Yes Has patient had a PCN reaction causing severe rash involving mucus membranes or skin necrosis: Yes Has patient had a PCN reaction that required hospitalization No Has patient had a PCN reaction occurring within the last 10 years: No If all of the above answers are NO, then may proceed with Cephalosporin use.

## 2024-03-01 NOTE — Assessment & Plan Note (Signed)
 Continue with Eliquis  5 mg twice daily for CVA prophylaxis, Toprol -XL 25 mg daily

## 2024-03-01 NOTE — Assessment & Plan Note (Addendum)
 Stable.  On 1000 mg of Tylenol  3 times a day.

## 2024-03-01 NOTE — Assessment & Plan Note (Signed)
 Continue with Jardiance  10 mg daily, metformin  500 mg twice daily, Tradjenta  5 mg daily.  Last hemoglobin A1c of 7.1%.  Adequate glycemic control for 89 year old female.  HgBA1c and mean plasma glucose: Lab Results  Component Value Date/Time   HGBA1C 7.1 01/11/2024 12:00 AM   MPG 191.51 04/17/2022 08:20 PM

## 2024-03-01 NOTE — Assessment & Plan Note (Signed)
 Stable.  Not on any routine inhalers.

## 2024-03-01 NOTE — Assessment & Plan Note (Signed)
 Stable.  Continue with Benefiber once a day.

## 2024-03-01 NOTE — Assessment & Plan Note (Signed)
 Continue with Lasix  20 mg daily, Jardiance  10 mg daily, lisinopril  5 mg daily, Toprol -XL 25 mg daily

## 2024-03-01 NOTE — Assessment & Plan Note (Signed)
 Continue with Eliquis  5 mg twice daily for CVA prophylaxis for A-fib.

## 2024-03-31 ENCOUNTER — Encounter: Payer: Self-pay | Admitting: Nurse Practitioner

## 2024-03-31 ENCOUNTER — Non-Acute Institutional Stay (SKILLED_NURSING_FACILITY): Payer: Self-pay | Admitting: Nurse Practitioner

## 2024-03-31 DIAGNOSIS — K52831 Collagenous colitis: Secondary | ICD-10-CM | POA: Diagnosis not present

## 2024-03-31 DIAGNOSIS — M1990 Unspecified osteoarthritis, unspecified site: Secondary | ICD-10-CM | POA: Diagnosis not present

## 2024-03-31 DIAGNOSIS — I10 Essential (primary) hypertension: Secondary | ICD-10-CM | POA: Diagnosis not present

## 2024-03-31 DIAGNOSIS — I480 Paroxysmal atrial fibrillation: Secondary | ICD-10-CM | POA: Diagnosis not present

## 2024-03-31 DIAGNOSIS — E785 Hyperlipidemia, unspecified: Secondary | ICD-10-CM

## 2024-03-31 DIAGNOSIS — I5032 Chronic diastolic (congestive) heart failure: Secondary | ICD-10-CM | POA: Diagnosis not present

## 2024-03-31 DIAGNOSIS — Z7984 Long term (current) use of oral hypoglycemic drugs: Secondary | ICD-10-CM

## 2024-03-31 DIAGNOSIS — N368 Other specified disorders of urethra: Secondary | ICD-10-CM

## 2024-03-31 DIAGNOSIS — D692 Other nonthrombocytopenic purpura: Secondary | ICD-10-CM | POA: Diagnosis not present

## 2024-03-31 DIAGNOSIS — E1169 Type 2 diabetes mellitus with other specified complication: Secondary | ICD-10-CM | POA: Diagnosis not present

## 2024-03-31 DIAGNOSIS — E1159 Type 2 diabetes mellitus with other circulatory complications: Secondary | ICD-10-CM

## 2024-03-31 NOTE — Progress Notes (Signed)
 " Location:  Other [facility].  Nursing Home Room Number: [facility] [id number] Place of Service:  SNF [phone]) Harlene An, NP  PCP: Laurence Locus, DO  Patient Care Team: Laurence Locus, DO as PCP - General (Internal Medicine) End, Lonni, MD as PCP - Cardiology (Cardiology) Mittie Gaskin, MD as Referring Physician (Ophthalmology) An Harlene POUR, NP as Nurse Practitioner (Geriatric Medicine) [facility], [facility])  Extended Emergency Contact Information Primary Emergency Contact: Bridgette Barnie DEL Address: [address]          [place], [place] [address] [place] Home Phone: [phone] Work Phone: [phone] Relation: Daughter  Goals of care: Advanced Directive information    03/01/2024    9:39 AM  Advanced Directives  Does Patient Have a Medical Advance Directive? Yes  Type of Advance Directive Out of facility DNR (pink MOST or yellow form)  Does patient want to make changes to medical advance directive? No - Patient declined     Chief Complaint  Patient presents with   Medical Management of Chronic Issues    Medical Management of Chronic Issues.     HPI:  Pt is a 89 y.o. female seen today for medical management of chronic disease. Pt with hx of DM, a fib, htn, OA, htn, COPD and CHF.  She reports she has been doing well in the last month.  She denies any worsening or new pains.  She states she has not had any shortness of breath, cough or congestion.  Denies chest pains or palpitations.  No Le edema.  Denies any unusual bruising or bleeding.    Past Medical History:  Diagnosis Date   Acute pancreatitis 07/24/2021   Aortic atherosclerosis    Arthritis    Basal cell carcinoma 03/30/2008   left upper arm/excision   Basal cell carcinoma 03/31/2008   left upper arm, right lower leg   Basal cell carcinoma 01/18/2009   left upper arm   Centrilobular emphysema (HCC)    Chronic heart failure with preserved ejection fraction (HCC)     Collagenous colitis    COPD (chronic obstructive pulmonary disease) (HCC)    Dementia (HCC)    Diabetes mellitus without complication (HCC)    Diverticulitis    Elevated LFTs 07/25/2021   Heart murmur    at birth, none since   History of echocardiogram    a. 11/2015: echo showing EF of 60-65% with no WMA. Grade 1 DD and mild MR noted.    Hyperlipidemia    Per [facility]   Hypertension    Intertrochanteric fracture of right femur (HCC) 06/11/2019   Lack of coordination    Per [facility] term current use of anticoagulant    Per [facility]   Noninfective gastroenteritis and colitis, unspecified    Per [facility]   PAF (paroxysmal atrial fibrillation) (HCC)    a. initially diagnosed in 08/2016 --> started on Eliquis    Squamous cell carcinoma of skin 05/16/2020   Left nasal tip anterior to ala, refer to [facility]   Syncope    a. initially occurring in Fall 2016 b. Hospitalized in 10/2015 and 11/2015 for recurrent episodes.    Past Surgical History:  Procedure Laterality Date   ABDOMINAL HYSTERECTOMY     BACK SURGERY  08/08/2008   Lumbar discectomy   CATARACT EXTRACTION W/PHACO Left 12/08/2023   Procedure: PHACOEMULSIFICATION, CATARACT, WITH IOL  27.38 02:03.5;  Surgeon: Jaye Fallow, MD;  Location: [facility];  Service: Ophthalmology;  Laterality: Left;   COLONOSCOPY WITH PROPOFOL      EXCISION/RELEASE BURSA HIP Left 05/03/2019   Procedure: HIP ABDUCTOR REPAIR;  Surgeon: Kathlynn Sharper, MD;  Location: [facility];  Service: Orthopedics;  Laterality: Left;   INTRAMEDULLARY (IM) NAIL INTERTROCHANTERIC Right 06/13/2019   Procedure: INTRAMEDULLARY (IM) NAIL INTERTROCHANTRIC;  Surgeon: Kathlynn Sharper, MD;  Location: [facility];  Service: Orthopedics;  Laterality: Right;   NECK SURGERY  1980    Allergies[1]  Outpatient Encounter Medications as of 03/31/2024  Medication Sig   acetaminophen  (TYLENOL ) 500 MG tablet Take 1,000 mg by mouth 3 (three) times daily.   apixaban   (ELIQUIS ) 5 MG TABS tablet Take 1 tablet (5 mg total) by mouth 2 (two) times daily.   ARTIFICIAL TEAR SOLUTION OP Apply 1 drop to eye 4 (four) times daily.   bismuth subsalicylate (PEPTO BISMOL) 262 MG/15ML suspension Take 30 mLs by mouth every 4 (four) hours as needed.   Cholecalciferol  (VITAMIN D ) 50 MCG (2000 UT) CAPS Give one capsule by mouth once daily .   Clobetasol  Propionate 0.05 % shampoo Apply 1 Application topically every morning. And wash out. Tues,Fri   diclofenac Sodium (VOLTAREN) 1 % GEL Apply 4 g topically every 6 (six) hours as needed.   empagliflozin  (JARDIANCE ) 10 MG TABS tablet Take 1 tablet (10 mg total) by mouth daily before breakfast.   ezetimibe  (ZETIA ) 10 MG tablet Take 1 tablet (10 mg total) by mouth daily.   furosemide  (LASIX ) 20 MG tablet Take 20 mg by mouth daily. Give one tablet by mouth every 24 hours as needed if 3lbs or greater weight gain in 24hrs.   glipiZIDE  (GLUCOTROL ) 10 MG tablet Take 10 mg by mouth 2 (two) times daily before a meal.   lidocaine  4 % Place 1 patch onto the skin daily.   linagliptin  (TRADJENTA ) 5 MG TABS tablet Take 5 mg by mouth daily.   lisinopril  (ZESTRIL ) 5 MG tablet Take 5 mg by mouth daily.   metFORMIN  (GLUCOPHAGE ) 500 MG tablet Take 500 mg by mouth 2 (two) times daily.   metoprolol  succinate (TOPROL -XL) 25 MG 24 hr tablet TAKE 1 TABLET BY MOUTH ONCE DAILY   Multiple Vitamin (MULTIVITAMIN WITH MINERALS) TABS tablet Take 1 tablet by mouth daily.   Throat Lozenges (COUGH DROPS) LOZG Use as directed 1 lozenge in the mouth or throat as needed.   Wheat Dextrin (BENEFIBER PO) Take 1 each by mouth daily.   ZINC OXIDE, TOPICAL, EX Apply 1 Application topically every 8 (eight) hours as needed (urethra prolapse).   acetaminophen  (TYLENOL ) 325 MG tablet Take 650 mg by mouth every 4 (four) hours as needed. (Patient not taking: Reported on 03/31/2024)   fluocinolone  (SYNALAR ) 0.01 % external solution Apply 1 Application topically at bedtime. Apply to  scalp (Patient not taking: Reported on 03/31/2024)   No facility-administered encounter medications on file as of 03/31/2024.    Review of Systems  Constitutional:  Negative for activity change, appetite change, fatigue and unexpected weight change.  HENT:  Negative for congestion and hearing loss.   Eyes: Negative.   Respiratory:  Negative for cough and shortness of breath.   Cardiovascular:  Negative for chest pain, palpitations and leg swelling.  Gastrointestinal:  Negative for abdominal pain, constipation and diarrhea.  Genitourinary:  Negative for difficulty urinating and dysuria.  Musculoskeletal:  Negative for arthralgias and myalgias.  Skin:  Negative for color change and wound.  Neurological:  Negative for dizziness and weakness.  Psychiatric/Behavioral:  Negative for agitation, behavioral problems  and confusion.      Immunization History  Administered Date(s) Administered   Fluad Quad(high Dose 65+) 12/20/2018   INFLUENZA, HIGH DOSE SEASONAL PF 01/18/2014, 01/01/2015, 11/30/2015, 12/01/2016, 01/11/2018, 12/08/2019, 12/26/2022   Influenza Split 01/01/2009, 01/02/2010, 01/13/2011, 12/22/2011   Influenza-Unspecified 01/08/2013, 11/08/2020, 12/25/2021, 12/22/2023   Moderna Covid-19 Fall Seasonal Vaccine 39yrs & older 06/17/2022, 12/05/2022   Moderna Covid-19 Vaccine Bivalent Booster 67yrs & up 11/30/2020, 08/06/2021, 01/17/2022   PFIZER Comirnaty(Gray Top)Covid-19 Tri-Sucrose Vaccine 07/27/2020   PFIZER(Purple Top)SARS-COV-2 Vaccination 04/01/2019, 04/22/2019, 12/05/2019, 07/27/2020   Pfizer Covid-19 Vaccine Bivalent Booster 70yrs & up 11/30/2020   Pneumococcal Conjugate-13 10/19/2013   Pneumococcal Polysaccharide-23 01/02/2010   Td 11/02/2003, 01/13/2011   Tdap 01/13/2011, 02/28/2015   Unspecified SARS-COV-2 Vaccination 06/19/2023, 01/01/2024   Zoster Recombinant(Shingrix) 08/11/2022, 11/25/2022   Zoster, Live 07/14/2007   Pertinent  Health Maintenance Due  Topic Date Due    OPHTHALMOLOGY EXAM  05/03/2024   HEMOGLOBIN A1C  07/10/2024   FOOT EXAM  10/29/2024   Influenza Vaccine  Completed   Bone Density Scan  Completed   Mammogram  Discontinued      07/24/2021   10:50 PM 07/25/2021   10:02 AM 07/26/2021    1:00 AM 09/18/2022    9:32 AM 10/27/2023   10:25 AM  Fall Risk  Falls in the past year?    0 0  Was there an injury with Fall?     0   Fall Risk Category Calculator     0  (RETIRED) Patient Fall Risk Level High fall risk  High fall risk  High fall risk     Patient at Risk for Falls Due to     Impaired balance/gait;Impaired mobility     Data saved with a previous flowsheet row definition   Functional Status Survey:    Vitals:   03/31/24 1257  BP: 118/74  Pulse: 80  Resp: 20  Temp: (!) 96.9 F (36.1 C)  SpO2: 98%  Weight: 165 lb 6.4 oz (75 kg)  Height: 5' 3 (1.6 m)   Body mass index is 29.3 kg/m. Physical Exam Constitutional:      General: She is not in acute distress.    Appearance: She is well-developed. She is not diaphoretic.  HENT:     Head: Normocephalic and atraumatic.     Mouth/Throat:     Pharynx: No oropharyngeal exudate.  Eyes:     Conjunctiva/sclera: Conjunctivae normal.     Pupils: Pupils are equal, round, and reactive to light.  Cardiovascular:     Rate and Rhythm: Normal rate and regular rhythm.     Heart sounds: Normal heart sounds.  Pulmonary:     Effort: Pulmonary effort is normal.     Breath sounds: Normal breath sounds.  Abdominal:     General: Bowel sounds are normal.     Palpations: Abdomen is soft.  Musculoskeletal:     Cervical back: Normal range of motion and neck supple.     Right lower leg: No edema.     Left lower leg: No edema.  Skin:    General: Skin is warm and dry.  Neurological:     Mental Status: She is alert. Mental status is at baseline.     Motor: Weakness present.     Gait: Gait abnormal.  Psychiatric:        Mood and Affect: Mood normal.     Labs reviewed: Recent Labs     04/27/23 0000 06/11/23 0000 10/23/23 1510 01/11/24 0000  NA 141 141  --  138  K 4.3 4.3  --  4.3  CL 109* 106  --  104  CO2 22 27*  --  27*  BUN 38* 25*  --  26*  CREATININE 1.1 1.0 1.50* 1.2*  CALCIUM  8.7 9.0  --  9.2   Recent Labs    01/11/24 0000  AST 17  ALT 27  ALKPHOS 48  ALBUMIN 3.9   Recent Labs    04/27/23 0000 10/19/23 0000 12/11/23 0000 01/11/24 0000  WBC 6.1 6.3 7.4 6.7  NEUTROABS 1,830.00 2,596.00  --  3,223.00  HGB 12.4 12.0 13.8 13.0  HCT 37 36 43 40  PLT 237 263 322 266   Lab Results  Component Value Date   TSH 2.284 04/17/2022   Lab Results  Component Value Date   HGBA1C 7.1 01/11/2024   Lab Results  Component Value Date   CHOL 241 (A) 11/20/2022   HDL 43 11/20/2022   LDLCALC 165 11/20/2022   TRIG 179 (A) 11/20/2022   CHOLHDL 5.2 05/10/2022    Significant Diagnostic Results in last 30 days:  No results found.  Assessment/Plan Urethral prolapse No recurrent bleeding. Stable followed by urologist, to hold pressure to area if bleeding reoccurs.   Type 2 diabetes mellitus with other circulatory complications (HCC) Continue with Jardiance  10 mg daily, metformin  500 mg twice daily, Tradjenta  5 mg daily.  Last hemoglobin A1c of 7.1%.  which is much improved from prior- she declines any injections medication  Senile purpura Stable, continue to monitor  Paroxysmal atrial fibrillation (HCC) Rate controlled, continue with Eliquis  5 mg twice daily for CVA prophylaxis, Toprol -XL 25 mg daily  Hyperlipidemia associated with type 2 diabetes mellitus (HCC) Continue with Zetia  10 mg daily, due to age will not aggressively treat  Essential hypertension Well controlled, Continue with Lasix  20 mg daily, Jardiance  10 mg daily, lisinopril  5 mg daily, Toprol -XL 25 mg daily  Chronic heart failure with preserved ejection fraction (HFpEF) (HCC) Euvolemic, Continue with Lasix  20 mg every morning and as needed 20 mg for weight gain greater than 5 pounds in  1 week, Jardiance  10 mg daily, lisinopril  5 mg daily, Toprol -XL 25 mg daily  CC (collagenous colitis) Stable.  Continue with Benefiber once a day.  Arthritis Stable.  On 1000 mg of Tylenol  3 times a day.     Deborah Lazcano K. Caro BODILY [facility] [phone]       [1]  Allergies Allergen Reactions   Morphine  Sulfate Other (See Comments)    BP bottoms out   Penicillins Diarrhea    Has patient had a PCN reaction causing immediate rash, facial/tongue/throat swelling, SOB or lightheadedness with hypotension: Yes Has patient had a PCN reaction causing severe rash involving mucus membranes or skin necrosis: Yes Has patient had a PCN reaction that required hospitalization No Has patient had a PCN reaction occurring within the last 10 years: No If all of the above answers are NO, then may proceed with Cephalosporin use.   "

## 2024-04-03 NOTE — Assessment & Plan Note (Signed)
 Rate controlled, continue with Eliquis  5 mg twice daily for CVA prophylaxis, Toprol -XL 25 mg daily

## 2024-04-03 NOTE — Assessment & Plan Note (Signed)
 No recurrent bleeding. Stable followed by urologist, to hold pressure to area if bleeding reoccurs.

## 2024-04-03 NOTE — Assessment & Plan Note (Signed)
 Continue with Jardiance  10 mg daily, metformin  500 mg twice daily, Tradjenta  5 mg daily.  Last hemoglobin A1c of 7.1%.  which is much improved from prior- she declines any injections medication

## 2024-04-03 NOTE — Assessment & Plan Note (Signed)
 Stable.  Continue with Benefiber once a day.

## 2024-04-03 NOTE — Assessment & Plan Note (Signed)
 Continue with Zetia  10 mg daily, due to age will not aggressively treat

## 2024-04-03 NOTE — Assessment & Plan Note (Signed)
 Stable.  On 1000 mg of Tylenol  3 times a day.

## 2024-04-03 NOTE — Assessment & Plan Note (Signed)
 Euvolemic, Continue with Lasix  20 mg every morning and as needed 20 mg for weight gain greater than 5 pounds in 1 week, Jardiance  10 mg daily, lisinopril  5 mg daily, Toprol -XL 25 mg daily

## 2024-04-03 NOTE — Assessment & Plan Note (Signed)
 Well controlled, Continue with Lasix  20 mg daily, Jardiance  10 mg daily, lisinopril  5 mg daily, Toprol -XL 25 mg daily

## 2024-04-03 NOTE — Assessment & Plan Note (Signed)
 Stable, continue to monitor
